# Patient Record
Sex: Female | Born: 1935 | Race: White | Hispanic: No | Marital: Married | Smoking: Never smoker
Health system: Southern US, Community
[De-identification: ages and names within clinical notes are randomized; demographics above are authoritative.]

## PROBLEM LIST (undated history)

## (undated) DIAGNOSIS — I2721 Secondary pulmonary arterial hypertension: Secondary | ICD-10-CM

## (undated) DIAGNOSIS — M545 Low back pain, unspecified: Secondary | ICD-10-CM

## (undated) DIAGNOSIS — J189 Pneumonia, unspecified organism: Secondary | ICD-10-CM

## (undated) DIAGNOSIS — K219 Gastro-esophageal reflux disease without esophagitis: Secondary | ICD-10-CM

## (undated) DIAGNOSIS — K255 Chronic or unspecified gastric ulcer with perforation: Secondary | ICD-10-CM

## (undated) DIAGNOSIS — M1990 Unspecified osteoarthritis, unspecified site: Secondary | ICD-10-CM

## (undated) DIAGNOSIS — F329 Major depressive disorder, single episode, unspecified: Secondary | ICD-10-CM

## (undated) DIAGNOSIS — G4700 Insomnia, unspecified: Secondary | ICD-10-CM

## (undated) DIAGNOSIS — F32A Depression, unspecified: Secondary | ICD-10-CM

## (undated) DIAGNOSIS — G8929 Other chronic pain: Secondary | ICD-10-CM

## (undated) DIAGNOSIS — E871 Hypo-osmolality and hyponatremia: Secondary | ICD-10-CM

## (undated) DIAGNOSIS — D649 Anemia, unspecified: Secondary | ICD-10-CM

## (undated) DIAGNOSIS — I509 Heart failure, unspecified: Secondary | ICD-10-CM

## (undated) HISTORY — PX: COLON SURGERY: SHX602

## (undated) HISTORY — PX: COLONOSCOPY: SHX174

## (undated) HISTORY — PX: ABDOMINAL SURGERY: SHX537

## (undated) HISTORY — DX: Low back pain, unspecified: M54.50

## (undated) HISTORY — PX: JOINT REPLACEMENT: SHX530

## (undated) HISTORY — PX: ABDOMINAL HYSTERECTOMY: SHX81

## (undated) HISTORY — DX: Low back pain: M54.5

## (undated) HISTORY — PX: ERCP: SHX60

## (undated) HISTORY — PX: ROTATOR CUFF REPAIR: SHX139

## (undated) HISTORY — DX: Secondary pulmonary arterial hypertension: I27.21

## (undated) HISTORY — DX: Insomnia, unspecified: G47.00

## (undated) HISTORY — DX: Other chronic pain: G89.29

---

## 1998-01-12 ENCOUNTER — Ambulatory Visit (HOSPITAL_COMMUNITY): Admission: RE | Admit: 1998-01-12 | Discharge: 1998-01-12 | Payer: Self-pay | Admitting: Family Medicine

## 1998-03-20 HISTORY — PX: BACK SURGERY: SHX140

## 1998-05-26 ENCOUNTER — Encounter: Payer: Self-pay | Admitting: General Surgery

## 1998-05-26 ENCOUNTER — Inpatient Hospital Stay (HOSPITAL_COMMUNITY): Admission: RE | Admit: 1998-05-26 | Discharge: 1998-05-29 | Payer: Self-pay | Admitting: General Surgery

## 1998-06-06 ENCOUNTER — Emergency Department (HOSPITAL_COMMUNITY): Admission: EM | Admit: 1998-06-06 | Discharge: 1998-06-06 | Payer: Self-pay | Admitting: Emergency Medicine

## 1998-11-29 ENCOUNTER — Inpatient Hospital Stay (HOSPITAL_COMMUNITY): Admission: EM | Admit: 1998-11-29 | Discharge: 1998-12-07 | Payer: Self-pay | Admitting: Emergency Medicine

## 1998-11-30 ENCOUNTER — Encounter: Payer: Self-pay | Admitting: General Surgery

## 1998-12-01 ENCOUNTER — Encounter: Payer: Self-pay | Admitting: General Surgery

## 1999-01-05 ENCOUNTER — Ambulatory Visit (HOSPITAL_COMMUNITY): Admission: RE | Admit: 1999-01-05 | Discharge: 1999-01-05 | Payer: Self-pay | Admitting: General Surgery

## 1999-01-05 ENCOUNTER — Encounter: Payer: Self-pay | Admitting: General Surgery

## 1999-07-25 ENCOUNTER — Inpatient Hospital Stay (HOSPITAL_COMMUNITY): Admission: RE | Admit: 1999-07-25 | Discharge: 1999-07-28 | Payer: Self-pay | Admitting: General Surgery

## 2001-02-03 ENCOUNTER — Encounter: Payer: Self-pay | Admitting: Emergency Medicine

## 2001-02-03 ENCOUNTER — Emergency Department (HOSPITAL_COMMUNITY): Admission: EM | Admit: 2001-02-03 | Discharge: 2001-02-03 | Payer: Self-pay | Admitting: Neurology

## 2001-04-09 ENCOUNTER — Ambulatory Visit (HOSPITAL_BASED_OUTPATIENT_CLINIC_OR_DEPARTMENT_OTHER): Admission: RE | Admit: 2001-04-09 | Discharge: 2001-04-09 | Payer: Self-pay | Admitting: General Surgery

## 2001-04-09 ENCOUNTER — Encounter (INDEPENDENT_AMBULATORY_CARE_PROVIDER_SITE_OTHER): Payer: Self-pay | Admitting: *Deleted

## 2001-11-03 ENCOUNTER — Emergency Department (HOSPITAL_COMMUNITY): Admission: EM | Admit: 2001-11-03 | Discharge: 2001-11-03 | Payer: Self-pay

## 2003-10-15 ENCOUNTER — Encounter: Admission: RE | Admit: 2003-10-15 | Discharge: 2003-10-15 | Payer: Self-pay | Admitting: Cardiology

## 2003-12-20 ENCOUNTER — Emergency Department (HOSPITAL_COMMUNITY): Admission: EM | Admit: 2003-12-20 | Discharge: 2003-12-20 | Payer: Self-pay | Admitting: Emergency Medicine

## 2005-05-12 ENCOUNTER — Ambulatory Visit (HOSPITAL_COMMUNITY): Admission: RE | Admit: 2005-05-12 | Discharge: 2005-05-12 | Payer: Self-pay | Admitting: Gastroenterology

## 2005-05-12 ENCOUNTER — Encounter (INDEPENDENT_AMBULATORY_CARE_PROVIDER_SITE_OTHER): Payer: Self-pay | Admitting: Specialist

## 2008-03-26 ENCOUNTER — Ambulatory Visit (HOSPITAL_COMMUNITY): Admission: EM | Admit: 2008-03-26 | Discharge: 2008-03-26 | Payer: Self-pay | Admitting: Emergency Medicine

## 2008-04-30 ENCOUNTER — Encounter: Admission: RE | Admit: 2008-04-30 | Discharge: 2008-04-30 | Payer: Self-pay | Admitting: Orthopaedic Surgery

## 2008-05-12 ENCOUNTER — Encounter: Admission: RE | Admit: 2008-05-12 | Discharge: 2008-05-12 | Payer: Self-pay | Admitting: Orthopaedic Surgery

## 2008-08-29 ENCOUNTER — Emergency Department (HOSPITAL_COMMUNITY): Admission: EM | Admit: 2008-08-29 | Discharge: 2008-08-29 | Payer: Self-pay | Admitting: Emergency Medicine

## 2008-09-02 ENCOUNTER — Ambulatory Visit (HOSPITAL_BASED_OUTPATIENT_CLINIC_OR_DEPARTMENT_OTHER): Admission: RE | Admit: 2008-09-02 | Discharge: 2008-09-02 | Payer: Self-pay | Admitting: Orthopedic Surgery

## 2008-11-26 ENCOUNTER — Emergency Department (HOSPITAL_COMMUNITY): Admission: EM | Admit: 2008-11-26 | Discharge: 2008-11-26 | Payer: Self-pay | Admitting: Emergency Medicine

## 2010-01-28 ENCOUNTER — Encounter: Admission: RE | Admit: 2010-01-28 | Discharge: 2010-01-28 | Payer: Self-pay | Admitting: Unknown Physician Specialty

## 2010-03-20 HISTORY — PX: ORIF WRIST FRACTURE: SHX2133

## 2010-03-20 HISTORY — PX: SHOULDER ARTHROSCOPY: SHX128

## 2010-03-20 HISTORY — PX: HERNIA REPAIR: SHX51

## 2010-04-13 LAB — DIFFERENTIAL
Basophils Absolute: 0 10*3/uL (ref 0.0–0.1)
Basophils Relative: 0 % (ref 0–1)
Eosinophils Absolute: 0 10*3/uL (ref 0.0–0.7)
Eosinophils Relative: 0 % (ref 0–5)
Lymphocytes Relative: 26 % (ref 12–46)
Lymphs Abs: 1.6 10*3/uL (ref 0.7–4.0)
Monocytes Absolute: 0.5 10*3/uL (ref 0.1–1.0)
Monocytes Relative: 8 % (ref 3–12)
Neutro Abs: 4.1 10*3/uL (ref 1.7–7.7)
Neutrophils Relative %: 65 % (ref 43–77)

## 2010-04-13 LAB — URINALYSIS, ROUTINE W REFLEX MICROSCOPIC
Bilirubin Urine: NEGATIVE
Hgb urine dipstick: NEGATIVE
Ketones, ur: NEGATIVE mg/dL
Nitrite: NEGATIVE
Protein, ur: NEGATIVE mg/dL
Specific Gravity, Urine: 1.019 (ref 1.005–1.030)
Urine Glucose, Fasting: NEGATIVE mg/dL
Urobilinogen, UA: 0.2 mg/dL (ref 0.0–1.0)
pH: 7 (ref 5.0–8.0)

## 2010-04-13 LAB — URINE MICROSCOPIC-ADD ON

## 2010-04-13 LAB — BASIC METABOLIC PANEL
BUN: 9 mg/dL (ref 6–23)
Calcium: 10.1 mg/dL (ref 8.4–10.5)
Creatinine, Ser: 0.66 mg/dL (ref 0.4–1.2)
GFR calc non Af Amer: >60 mL/min (ref >60)
Glucose, Bld: 98 mg/dL (ref 70–99)

## 2010-04-13 LAB — APTT: aPTT: 30 seconds (ref 24–37)

## 2010-04-13 LAB — CBC
HCT: 41.8 % (ref 36.0–46.0)
Hemoglobin: 13.5 g/dL (ref 12.0–15.0)
MCH: 32.1 pg (ref 26.0–34.0)
MCHC: 32.3 g/dL (ref 30.0–36.0)
MCV: 99.3 fL (ref 78.0–100.0)
Platelets: 292 10*3/uL (ref 150–400)
RBC: 4.21 MIL/uL (ref 3.87–5.11)
RDW: 13.8 % (ref 11.5–15.5)
WBC: 6.3 10*3/uL (ref 4.0–10.5)

## 2010-04-13 LAB — SURGICAL PCR SCREEN: Staphylococcus aureus: NEGATIVE

## 2010-04-13 LAB — PROTIME-INR
INR: 1 (ref 0.00–1.49)
Prothrombin Time: 13.4 seconds (ref 11.6–15.2)

## 2010-04-19 ENCOUNTER — Inpatient Hospital Stay (HOSPITAL_COMMUNITY)
Admission: RE | Admit: 2010-04-19 | Discharge: 2010-04-22 | DRG: 484 | Disposition: A | Discharge status: Home Health | Payer: Medicare Other | Attending: Orthopedic Surgery | Admitting: Orthopedic Surgery

## 2010-04-19 DIAGNOSIS — M1990 Unspecified osteoarthritis, unspecified site: Secondary | ICD-10-CM | POA: Diagnosis present

## 2010-04-19 DIAGNOSIS — H409 Unspecified glaucoma: Secondary | ICD-10-CM | POA: Diagnosis present

## 2010-04-19 DIAGNOSIS — M549 Dorsalgia, unspecified: Secondary | ICD-10-CM | POA: Diagnosis present

## 2010-04-19 DIAGNOSIS — M719 Bursopathy, unspecified: Principal | ICD-10-CM | POA: Diagnosis present

## 2010-04-19 DIAGNOSIS — D649 Anemia, unspecified: Secondary | ICD-10-CM | POA: Diagnosis present

## 2010-04-19 DIAGNOSIS — M67919 Unspecified disorder of synovium and tendon, unspecified shoulder: Principal | ICD-10-CM | POA: Diagnosis present

## 2010-04-19 DIAGNOSIS — G8929 Other chronic pain: Secondary | ICD-10-CM | POA: Diagnosis present

## 2010-04-19 DIAGNOSIS — K219 Gastro-esophageal reflux disease without esophagitis: Secondary | ICD-10-CM | POA: Diagnosis present

## 2010-04-20 LAB — CBC
HCT: 28.9 % — ABNORMAL LOW (ref 36.0–46.0)
Hemoglobin: 9.7 g/dL — ABNORMAL LOW (ref 12.0–15.0)
MCH: 33 pg (ref 26.0–34.0)
MCV: 98.3 fL (ref 78.0–100.0)
Platelets: 168 10*3/uL (ref 150–400)
RBC: 2.94 MIL/uL — ABNORMAL LOW (ref 3.87–5.11)
WBC: 8.2 10*3/uL (ref 4.0–10.5)

## 2010-04-20 LAB — BASIC METABOLIC PANEL
BUN: 5 mg/dL — ABNORMAL LOW (ref 6–23)
CO2: 26 mEq/L (ref 19–32)
Chloride: 103 mEq/L (ref 96–112)
Creatinine, Ser: 0.65 mg/dL (ref 0.4–1.2)
Glucose, Bld: 163 mg/dL — ABNORMAL HIGH (ref 70–99)
Potassium: 3.8 mEq/L (ref 3.5–5.1)

## 2010-04-21 LAB — CBC
HCT: 29.3 % — ABNORMAL LOW (ref 36.0–46.0)
MCH: 31.9 pg (ref 26.0–34.0)
MCV: 97.3 fL (ref 78.0–100.0)
RDW: 13.3 % (ref 11.5–15.5)
WBC: 7.8 10*3/uL (ref 4.0–10.5)

## 2010-04-21 LAB — BASIC METABOLIC PANEL
BUN: 4 mg/dL — ABNORMAL LOW (ref 6–23)
Creatinine, Ser: 0.67 mg/dL (ref 0.4–1.2)
GFR calc non Af Amer: >60 mL/min (ref >60)
Glucose, Bld: 137 mg/dL — ABNORMAL HIGH (ref 70–99)
Potassium: 3.8 mEq/L (ref 3.5–5.1)

## 2010-05-03 NOTE — Discharge Summary (Signed)
  NAME:  AARALYN, KIL NO.:  [id number]  MEDICAL RECORD NO.:  [id number]           PATIENT TYPE:  I  LOCATION:  5023                         FACILITY:  [facility]  PHYSICIAN:  Jones Broom, MD    DATE OF BIRTH:  17-May-1930  DATE OF ADMISSION:  04/19/2010 DATE OF DISCHARGE:  04/22/2010                              DISCHARGE SUMMARY   FINAL DIAGNOSES: 1. Left shoulder rotator cuff tear arthropathy. 2. Chronic back pain. 3. Osteoarthritis.  PRINCIPAL PROCEDURE:  Left shoulder reverse total shoulder arthroplasty on April 19, 2010.  CONSULTING PHYSICIAN:  None.  HOSPITAL COURSE:  The patient was admitted after undergoing left reverse total shoulder replacement on April 19, 2010, which went uneventfully. She recovered well on the floor postoperatively.  Initially, she had significant pain given her chronic pain history.  She had a lot of back pain immediately after the surgery which resolved after the first 24 hours.  She initially refused all therapy as well as her lab draws and her vital signs, but quickly recovered and began working with therapy. This set her back a day in terms of her recovery.  Postoperative day #2, her dressing was changed and her incision was clean, dry and intact. Her drain was removed on postoperative day #1.  Her pain was under good control with her chronic long-acting pain medicine in addition to oral Dilaudid.  This was as recommended by her outpatient chronic pain management doctor.  On the day of discharge, her pain was well controlled.  Her incision was clean, dry and intact.  She had worked with physical therapy and occupational therapy, both recommended some home care.  She worked on gentle hand, wrist and elbow motion, but no shoulder motion at the time of discharge.  She remained stable with stable vital signs.  She had some acute blood loss anemia but remained stable and her blood count remained within the safe range.   No transfusion was necessary.  POSTOPERATIVE PLAN:  She will be discharged home with her husband. Occupational and physical therapy will be set up.  She will get a hemi walker to assist with ambulation given her chronic back issues.  I will see her back in 10-14 days or sooner with any problems.  Her discharge pain medications were all prescribed previously by her chronic pain management physician.     Jones Broom, MD     JC/MEDQ  D:  04/22/2010  T:  04/22/2010  Job:  [id number]  Electronically Signed by Jones Broom  on 05/03/2010 11:14:47 AM

## 2010-05-03 NOTE — Op Note (Signed)
NAME:  Monique Russo, Monique Russo NO.:  [id number]  MEDICAL RECORD NO.:  [id number]          PATIENT TYPE:  INP  LOCATION:  5023                         FACILITY:  [facility]  PHYSICIAN:  Jones Broom, MD    DATE OF BIRTH:  [birth year]  DATE OF PROCEDURE:  04/19/2010 DATE OF DISCHARGE:                              OPERATIVE REPORT   PREOPERATIVE DIAGNOSIS:  Left shoulder rotator cuff tear arthropathy.  POSTOPERATIVE DIAGNOSIS:  Left shoulder rotator cuff tear arthropathy.  PROCEDURE PERFORMED:  Left reverse total shoulder replacement.  ATTENDING SURGEON:  Jones Broom, MD  ASSISTANT:  [name], MD (Dr. Shelba Flake assistance was imperative in positioning, retraction and decision making during the procedure).  ANESTHESIA:  GETA with preoperative interscalene block given by the attending anesthesiologist.  COMPLICATIONS:  None.  DRAINS:  One medium Hemovac.  SPECIMENS:  None.  ESTIMATED BLOOD LOSS:  200 mL.  INDICATIONS FOR SURGERY:  The patient is a 77 year old female with chronic pain and a chronic left shoulder dysfunction.  She had a rotator cuff tear arthropathy on the left side which has been longstanding.  She has had many years of severe pain and dysfunction of the shoulder.  She has had multiple injections with diminishing results and wished to go forward with reverse total shoulder replacement understanding risks, benefits and alternatives to the procedure including but not limited to risk of bleeding, infection, damage to neurovascular structures, risk of intraoperative postoperative fracture, risk of dislocation, stiffness and potential need for future surgery.  She understood all of this and elected to go forward with surgery.  OPERATIVE FINDINGS:  Examination under anesthesia demonstrated preoperative range of motion limited to about 90 degrees forward flexion, external rotation of 5 degrees.  Postoperatively, I was able to bring up to about  150 degrees forward flexion, external rotation to 40 degrees.  A DePuy reverse total shoulder was implanted with a standard metaglene, a 42 mm glenosphere, a 12 stem and a 3-mm poly component.  Excellent stability was noted intraoperatively.  PROCEDURE IN DETAILS:  The patient was identified in the preoperative holding area where I personally marked the operative site after verifying site, side and procedure with the patient.  She had an interscalene block given by the attending anesthesiologist and was taken back to the operating room where she was transferred to the operative room table and general anesthesia was induced without complication.  She was placed in a beach-chair position with the head and neck carefully padded and positioned and the opposite extremity was carefully padded and positioned with a pillow beneath the knees.  Left upper extremity was then prepped and draped in a standard sterile fashion.  In the appropriate time-out, procedure was carried out with myself, the operative staff, and the anesthesia staff all verifying site, side and procedure.  An approximately 12-cm incision was made from the coracoid to the humeral shaft to the level of the axilla.  Dissection was carried down through subcutaneous tissues to the level of the cephalic vein which was identified and retracted laterally.  The conjoined tendon was identified in the deep layer and the deltoid  was retracted laterally and the pectoralis major was retracted medially.  The subdeltoid space was freed up and the anterior vessels were clamped and coagulated.  Biceps tendon was identified and was tenotomized at the level of the glenoid. Tenodesis was performed to the soft tissue just anterior to the pectoralis major after first releasing about the upper 1 cm of the pectoralis major.  The remaining portion of the long head biceps was excised.  The subscapularis was then tagged and released off of the lesser  tuberosity.  The arm was progressively externally rotated and capsulotomy was performed peeling the capsule and subscapularis off the anterior humerus and 1 sleeve.  The axillary nerve was carefully protected.  The head was then dislocated and the 6-mm entry reamer was used to enter the canal.  Sequential reaming was carried out until cortical contact was achieved at 12 mm.  The 12-mm cutting guide was then used to make a head cut in 10 degrees retroversion.  The head protector was then placed and the glenoid was exposed.  Circumferential glenoid capsular release was carried out and the glenoid was noted to be severely degenerated and preferentially superiorly worn.  The guide was placed and the pin was placed by cortical fixation, the superior portion of the glenoid in a position that was felt to allow preferential reaming of the inferior portion of the glenoid.  Reamer was then used to ream down the concentric surface.  It was not felt that bone grafting would be necessary.  The hand reamer was used to ream superiorly which did not take much reaming given the lack of bone superiorly.  The central hole was then drilled.  There was noted to be central penetration.  The glenosphere was impacted with good initial fixation.  The four holes were then drilled, measured and filled with locking screws superior, inferior and posterior and a nonlocking screw anterior size 18.  The three locking screws all had excellent fixation prior to locking.  The anterior screw had less than optimal fixation, but did provide some additional fixation and was left in place.  All screws were then locked and the glenosphere was felt to be well fixed.  The glenosphere was then impacted and alternatively tightened with a screwdriver and impacted. It was felt to be completely down.  The humeral head was then again exposed.  It was noted that during preparation of the glenoid, the very poor bone quality of her humeral  neck has caused some collapse in this region from retraction.  The proximal reamer was then used for size 1 component and the trial was placed.  It was felt that this would be reducible with a stable construct and therefore the final implant size 12 was opened and cemented with standard cement technique.  Prior to cementation. 2 fiber wires were placed out the anterior metaphysis for later partial subscapularis repair.  Once the cement was hardened, a 3- mm trial was reduced and felt to be excellent soft tissue tension. There was no tendency towards subluxation or dislocation with full range of motion including abduction and extension.  Therefore, this was dislocated and the 3-mm final poly implant was placed and reduced with the same soft tissue tension and stability.  At this point copious irrigation was used as pulse irrigator and the subscapularis was then repaired to the anterior humeral neck using the previously placed sutures to provide some additional anterior soft tissue coverage instability.  A medium Hemovac drain was placed out beneath the  deltoid insertion and the wound was then closed in layers with 0 Vicryl in a deep layer in the interval, 2-0 Vicryl for skin closure with 4-0 Monocryl.  Steri-Strips were then applied and 4x4s, ABDs, and tape were used as sterile dressing.  The patient was placed in a sling immobilizer, allowed to awaken from general anesthesia, transferred to stretcher, taken to recovery room in stable condition.  POSTOPERATIVE PLAN:  She will be kept in the hospital at least 2 nights for recovery and to begin therapy.  She will likely be discharged home at that point with her husband.  She will follow standard postoperative protocol for reverse shoulder arthroplasty.     Jones Broom, MD     JC/MEDQ  D:  04/19/2010  T:  04/20/2010  Job:  [id number]  Electronically Signed by Jones Broom  on 05/03/2010 11:14:53 AM

## 2010-06-24 LAB — DIFFERENTIAL
Basophils Absolute: 0 10*3/uL (ref 0.0–0.1)
Basophils Relative: 0 % (ref 0–1)
Eosinophils Relative: 1 % (ref 0–5)
Monocytes Absolute: 0.4 10*3/uL (ref 0.1–1.0)

## 2010-06-24 LAB — URINALYSIS, ROUTINE W REFLEX MICROSCOPIC
Bilirubin Urine: NEGATIVE
Glucose, UA: NEGATIVE mg/dL
Ketones, ur: NEGATIVE mg/dL
Nitrite: NEGATIVE
Protein, ur: NEGATIVE mg/dL
pH: 7.5 (ref 5.0–8.0)

## 2010-06-24 LAB — CBC
HCT: 39.8 % (ref 36.0–46.0)
Hemoglobin: 13.5 g/dL (ref 12.0–15.0)
MCHC: 34 g/dL (ref 30.0–36.0)
RDW: 13.1 % (ref 11.5–15.5)

## 2010-06-24 LAB — POCT I-STAT, CHEM 8
Calcium, Ion: 1.2 mmol/L (ref 1.12–1.32)
HCT: 41 % (ref 36.0–46.0)
Hemoglobin: 13.9 g/dL (ref 12.0–15.0)
TCO2: 28 mmol/L (ref 0–100)

## 2010-06-27 LAB — BASIC METABOLIC PANEL
BUN: 6 mg/dL (ref 6–23)
Chloride: 102 mEq/L (ref 96–112)
Creatinine, Ser: 0.56 mg/dL (ref 0.4–1.2)
GFR calc non Af Amer: >60 mL/min (ref >60)

## 2010-06-27 LAB — POCT HEMOGLOBIN-HEMACUE: Hemoglobin: 12.7 g/dL (ref 12.0–15.0)

## 2010-07-04 LAB — CBC
MCHC: 34.7 g/dL (ref 30.0–36.0)
RBC: 3.32 MIL/uL — ABNORMAL LOW (ref 3.87–5.11)

## 2010-07-04 LAB — BASIC METABOLIC PANEL
CO2: 27 mEq/L (ref 19–32)
Calcium: 7.2 mg/dL — ABNORMAL LOW (ref 8.4–10.5)
Creatinine, Ser: 0.46 mg/dL (ref 0.4–1.2)
GFR calc Af Amer: >60 mL/min (ref >60)

## 2010-07-04 LAB — DIFFERENTIAL
Basophils Absolute: 0 10*3/uL (ref 0.0–0.1)
Basophils Relative: 0 % (ref 0–1)
Monocytes Relative: 6 % (ref 3–12)
Neutro Abs: 7.4 10*3/uL (ref 1.7–7.7)
Neutrophils Relative %: 81 % — ABNORMAL HIGH (ref 43–77)

## 2010-07-04 LAB — PROTIME-INR
INR: 1.1 (ref 0.00–1.49)
Prothrombin Time: 14.3 seconds (ref 11.6–15.2)

## 2010-07-04 LAB — APTT: aPTT: 32 seconds (ref 24–37)

## 2010-08-02 NOTE — Op Note (Signed)
Monique Russo, Monique Russo NO.:  [id number]   MEDICAL RECORD NO.:  [id number]          PATIENT TYPE:  INP   LOCATION:  0098                         FACILITY:  [facility]   PHYSICIAN:  Feliberto Gottron. Turner Daniels, M.D.   DATE OF BIRTH:  [birth year]   DATE OF PROCEDURE:  03/26/2008  DATE OF DISCHARGE:                               OPERATIVE REPORT   PREOPERATIVE DIAGNOSIS:  Right shoulder inferior dislocation or luxatio  erecta.   POSTOPERATIVE DIAGNOSIS:  Right shoulder inferior dislocation or luxatio  erecta.   PROCEDURE:  Closed reduction right shoulder inferior dislocation.   SURGEON:  Feliberto Gottron. Turner Daniels, MD   FIRST ASSISTANT:  Shirl Harris, PA   ANESTHESIA:  Anesthetic was general endotracheal.   ESTIMATED BLOOD LOSS:  Minimal.   FLUID REPLACEMENT:  500 mL of crystalloid.   DRAINS PLACED:  None.   TOURNIQUET TIME:  None.   INDICATIONS FOR PROCEDURE:  The patient is a 77 year old woman on major  league chronic pain management taking 100 mg of morphine sulfate three  times a day extended release and 15 mg tablets of morphine sulfate for  breakthrough pain for neck and back pain for many years.  She slipped  and fell today and sustained an inferior dislocation of her right  shoulder with the arm held in the upright position classic luxatio  erecta position.  Transported to [facility].  X-rays were  taken confirming the diagnosis.  She did not have any actual fractures  of the bone.  Because she fell and struck her head she was  preoperatively evaluated with CTs of the head and neck which were  cleared except for some arthritic changes and had the usual preoperative  studies.  She is taken for closed reduction, possible open reduction of  the dislocation.  Risks and benefits of surgery discussed, questions  answered.   DESCRIPTION OF PROCEDURE:  The patient identified by armband, taken to  the operating room at [facility] where the  appropriate anesthetic monitors were attached and general endotracheal  anesthesia induced with the patient in the supine position.  She was  then transferred from the gurney to the OR table with the right shoulder  protruding laterally over the table.  Under C-arm image control we then  converted her to an anterior dislocation by using the push-pull method  with the push being applied to the medial epicondyle and the counter  pressure to the proximal humerus.  This did bring the humeral head  anteriorly and we then performed a standard anterior reduction by  bringing the arm down to the side with internal and external rotation  with a satisfying  reduction maneuver.  C-arm images were then taken in internal and  external rotation as well as an axillary lateral view confirming the  anatomic reduction of the humeral head.  The patient was then placed in  a sling immobilizer, awakened and taken to the recovery room without  difficulty.      Feliberto Gottron. Turner Daniels, M.D.  Electronically Signed     FJR/MEDQ  D:  03/26/2008  T:  03/27/2008  Job:  [id number]

## 2010-08-02 NOTE — Op Note (Signed)
NAME:  Monique Russo, Monique Russo                ACCOUNT NO.:  [id number]   MEDICAL RECORD NO.:  [id number]          PATIENT TYPE:  AMB   LOCATION:  DSC                          FACILITY:  [facility]   PHYSICIAN:  Harvie Junior, M.D.   DATE OF BIRTH:  Jun 01, 1930   DATE OF PROCEDURE:  09/02/2008  DATE OF DISCHARGE:                               OPERATIVE REPORT   PREOPERATIVE DIAGNOSIS:  Distal radius fracture with greater than 3  distal pieces.   POSTOPERATIVE DIAGNOSIS:  Distal radius fracture with greater than 3  distal pieces.   PRINCIPAL PROCEDURE:  Open reduction and internal fixation of comminuted  distal radius fracture with a distal volar radius plate.   SURGEON:  Harvie Junior, MD   ASSISTANT:  Marshia Ly, PA   ANESTHESIA:  General.   BRIEF HISTORY:  Monique Russo is a 77 year old female with long history of  having had a left distal radius fracture sometime in the past, she had  essentially a malunion and she was having sort of chronic pain.  She had  taken a tumble a couple of days ago and suffered a distal radius  fracture, comminuted, intraarticular.  We talked about treatment options  and ultimately felt that the most appropriate course of action would be  open reduction and internal fixation.  She was brought to the operating  room for this procedure.   PROCEDURE:  The patient was brought to the operating room.  After  adequate anesthesia was obtained with general anesthetic, the patient  was placed supine on the operating table.  The left wrist was then  prepped and draped in usual sterile fashion.  During the prepping and  draping, we noticed her to have a little bit of a complex wound over the  lateral aspect of her elbow, and we prepped and cleansed this into the  operative field.  Following this, the arm was prepped and draped in the  usual sterile fashion.  The arm was exsanguinated, blood pressure  tourniquet inflated to 350 mmHg.  Following this, a small incision was  made over the flexor carpi radialis tendon.  Subcutaneous tissues  dissected down to the level of flexor carpi radialis tendon.  Once this  was completed, the attention was turned towards the distal volar radius.  We got the FCR tendon sheath, explored to the floor, and then went down  onto the bone.  The palmaris quadratus was somewhat adherent into the  fracture, and I think this is from her previous fracture site.  This was  debrided, and the fracture fragments could be identified.  Following  this, attempts were made at sort of a distractive volarly inclined sort  of osteotomy-type procedure, and this was performed, and then the plate  was attached.  Once the plate was attached, it did give [place] access to be  able to manipulate the distal radius, and we did regain some radial  length and some volar inclination.  The plate was then attached to the  fracture with four 3.5 screws on the shaft and 7 distal smooth pegs.  Excellent fixation  and reduction was achieved with slight increase and  improvement in radial length and a slight improvement in volar  inclination.  Once this was completed, the wound was copiously and  thoroughly irrigated and suctioned dry, skin closed in  layers.  Sterile compression dressing was applied, and the patient was  taken to the recovery room, and was noted to be in satisfactory  condition.  Estimated blood loss for this procedure was none.  Of note,  fluoro images were used throughout the case.      Harvie Junior, M.D.  Electronically Signed     JLG/MEDQ  D:  09/02/2008  T:  09/03/2008  Job:  [id number]

## 2010-08-05 NOTE — Op Note (Signed)
[place]  Patient:    Monique Russo, Monique Russo              MRN: [id number] Proc. Date: 07/25/99 Adm. Date:  [id number] Attending:  Arlis Porta                           Operative Report  PREOPERATIVE DIAGNOSIS:  Recurrent ventral hernia measuring 9 cm x 10 cm.  POSTOPERATIVE DIAGNOSIS:  Recurrent ventral hernia measuring 9 cm x 10 cm.  PROCEDURE:  Laparoscopic ventral hernia repair with mesh.  SURGEON:  Adolph Pollack, M.D.  ASSISTANT:  Velora Heckler, M.D.  ANESTHESIA:  General.  INDICATIONS:  This 77 year old female had a laparoscopic repair of a giant ventral hernia 14 months ago.  She underwent emergency operation of her small bowel obstruction in September 2000 and now has had recurrence of her hernia and presents for repair.  TECHNIQUE:  She was placed supine on the operating table and general anesthetic was administered.  The abdomen was sterilely prepped and draped.  A left upper quadrant incision was made, incising the skin sharply and carrying this down to the subcutaneous tissue.  An incision was made in the anterior fascia and a purse-string suture of 0 Vicryl was placed around the fascial edges.  Next, using blunt dissection, the peritoneal cavity was entered.  A Hasson trocar was introduced into the peritoneal cavity and pneumoperitoneum created by insufflation of CO2 gas.  Next, a laparoscope was introduced.  I noted a hernia.  It appeared that part of the mesh had pulled away from the right lateral abdominal wall.  Up in the hernia was some small bowel that was adherent to it.  I placed a 5 mm trocar in the left lower quadrant and a 10 mm trocar in the right upper quadrant. With downward traction on the small bowel, I sharply incised adhesions and allowed the small bowel to fall back into the abdomen.  No damage to the small bowel was noted.  There was a slight cut of the inferior epigastric vessels and  these were clipped.  Areas of bleeding on the abdominal wall were controlled with the cautery.  Next, I took four spinal needles and placed them at the peripheral rim of the hernia.  I measured 3 cm away from these areas and drew a large oval.  The hernia defect measured 10 x 9 cm. I brought up an appropriate sized piece of Gore-Tex dual mesh to the field and cut it to fit the oval.  I then placed anchoring sutures at each quadrant.  I then rolled the mesh up and placed it into the peritoneal cavity and unfurled it with the rough side facing up and the smooth side facing down.  I next made four small incisions in the four quadrants and pulled at the sutures across the fascial bridge.  This allowed me to anchor the mesh to the anterior abdominal wall.  I further anchored it with an outer and inner rim of spiral tacks.  This allowed for more than adequate coverage of the defect.  Next, I released the pneumoperitoneum and watched the viscera approximate themselves to the smooth side of the mesh.  I then removed all trocar sites. The left upper quadrant fascial incision was closed by tying down the purse-string suture.  All incisions were then closed with a 4-0 Monocryl subcuticular sutures.  Steri-Strips and sterile dressings were applied.  She tolerated the procedure well without any apparent complications and was taken to the recovery room in satisfactory condition. DD:  07/25/99 TD:  07/27/99 Job: [id number]

## 2010-08-05 NOTE — H&P (Signed)
. [facility]  Patient:    Monique Russo, Monique Russo              MRN: [id number] Adm. Date:  [id number] Attending:  Arlis Porta                         History and Physical  REASON FOR ADMISSION:  Repair of recurrent ventral incisional hernia.  HISTORY OF PRESENT ILLNESS:  Monique Russo is a 77 year old female who has had multiple abdominal operations.  This started out with an emergency right colectomy.  She had a giant ventral incisional repair laparoscopically.  Six months after that she had a small bowel obstruction requiring operative management.  She noted a mid marginal bulge near her lower portion of her incision consistent with a hernia and she presents now for repair.  She has had no change in her bowel habits, no nausea and vomiting.  PAST MEDICAL HISTORY: 1. Bipolar disorder. 2. A cecal volvulus. 3. Glaucoma 4. Severe degenerative joint disease of the back.  PREVIOUS OPERATIONS: 1. Hysterectomy. 2. Right hemicolectomy. 3. Laparoscopic ventral hernia repair. 4. Lysis of adhesions from small bowel obstruction. 5. Foot surgery. 6. Bladder tacking.  ALLERGIES:  DEMEROL, CORTISONE, PREDNISONE, PREMARIN.  MEDICATIONS: 1. Lasix 40 mg a day. 2. Multivitamin 1 a day. 3. MS-Contin 60 mg t.i.d. 4. Restoril 30 mg q.h.s. 5. Timoptic ophthalmic solution 1 drop each eye q.h.s. 6. Cipro 500 mg b.i.d.  SOCIAL HISTORY:  She is married.  No tobacco or alcohol use.  PHYSICAL EXAMINATION:  GENERAL:  She is a teary female who appears very nervous.  VITAL SIGNS:  Temperature 99.0, blood pressure 130/70, heart rate 78, and respiratory rate of 18.  She is 5 foot 3 inches tall and weighs 142 pounds.  CARDIOVASCULAR:  Heart demonstrates regular rate and rhythm without a murmur.  RESPIRATORY:  Breath sounds equal and clear.  Respirations nonlabored.  ABDOMEN:  Soft.  There is obvious bulge in the right lower abdominal area just adjacent  to the mid line incision.  It is reducible.  There is a lower midline scar.  EXTREMITIES:  No edema.  IMPRESSION:  Recurrent ventral incisional hernia.  PLAN: Laparoscopic possible open repair with mesh.  The procedure and risks have been explained to her and her husband.  They seem to understand and agree to proceed. DD:  07/25/99 TD:  07/25/99 Job: [id number]

## 2010-08-05 NOTE — Op Note (Signed)
NAME:  Monique Russo, Monique Russo                ACCOUNT NO.:  [id number]   MEDICAL RECORD NO.:  [id number]          PATIENT TYPE:  AMB   LOCATION:  ENDO                         FACILITY:  [facility]   PHYSICIAN:  Anselmo Rod, M.D.  DATE OF BIRTH:  1930-05-16   DATE OF PROCEDURE:  DATE OF DISCHARGE:                                 OPERATIVE REPORT   PROCEDURE:  Esophagogastroduodenoscopy.   ENDOSCOPIST:  Anselmo Rod, M.D.   INSTRUMENT USED:  Olympus video panendoscope.   INDICATIONS FOR PROCEDURE:  Dysphagia and rectal bleeding in a 77 year old  white female, rule out peptic esophagitis, stricture, Barrett's mucosa, etc.   PREPROCEDURE PREPARATION:  Informed consent was obtained from the patient.  The patient was fasted for four hours prior to the procedure.  The risks and  benefits of the procedure including perforation, bleeding, etc., have been  discussed with her in great detail.   PREPROCEDURE PHYSICAL:  Patient with stable vital signs.  Neck supple.  Chest clear to auscultation.  S1 and S2 regular.  Abdomen soft with normal  bowel sounds.   DESCRIPTION OF PROCEDURE:  The patient was placed in the left lateral  decubitus position, sedated with 60 mcg of Fentanyl and 4.5 mg Versed in  slow incremental doses.  Once the patient was adequately sedated, maintained  on low flow oxygen and continuous cardiac monitoring, the Olympus video  panendoscope was advanced through the mouth piece over the tongue into the  esophagus under direct vision. The entire esophagus appeared normal with no  evidence of ring, strictures, masses, esophagitis, or Barrett's mucosa.  The  esophagus is widely patent.  The scope was then advanced into the stomach.  Mild antral gastritis was noted.  Gastric biopsies were done to rule out the  presence of H. pylori by pathology.  The proximal small bowel appeared  normal.   IMPRESSION:  1.  Widely patent esophagus, no esophagitis or Barrett's mucosa noted, no  stricture present.  2.  Mild gastritis noted in the mid body and antrum, biopsies done to rule      out H. pylori by pathology.  3.  Normal proximal small bowel.   RECOMMENDATIONS:  1.  Await pathology results.  2.  Avoid all nonsteroidals.  3.  Treat with antibiotics if H. pylori present on biopsies.  4.  Proceed with a colonoscopy at this time, further recommendations made      thereafter.      Anselmo Rod, M.D.  Electronically Signed     JNM/MEDQ  D:  05/12/2005  T:  05/13/2005  Job:  [id number]   cc:   Adolph Pollack, M.D.  [address], [address]  [place]  [place] [address]   Gabriel Earing, M.D.  Fax: [phone]

## 2010-08-05 NOTE — Discharge Summary (Signed)
[facility]  Patient:    Monique Russo, Monique Russo              MRN: [id number] Adm. Date:  [id number] Disc. Date: [id number] Attending:  Arlis Porta                           Discharge Summary  PRINCIPAL DISCHARGE DIAGNOSIS:  Recurrent ventral incisional hernia.  SECONDARY DIAGNOSES:  1. Bipolar disorder.  2. Gastroesophageal reflux disease.  3. Atelectasis.  4. Hypokalemia.  PROCEDURE:  Laparoscopic repair of recurrent ventral incisional hernia with mesh, Jul 25, 1999.  INDICATION:  This 77 year old female has had multiple abdominal surgeries including a previous laparoscopic ventral incisional hernia repair.  He had exploratory laparotomy for adhesive small-bowel obstruction following that. Recently she noted a bulge to the right of her midline.  This was consistent with ventral hernia.  She was admitted for repair of her ventral hernia.  HOSPITAL COURSE:  She tolerated the procedure well.  She did have some hypokalemia that was corrected.  She restarted on all her home medications including her Klonopin for her bipolar disorder.  She had episodes of mania and depression while in the hospital.  Pain control was adequate with the PCA. She did have some fever secondary to atelectasis, but once she was more mobile and using incentive spirometry the fever resolved.  By her third postoperative day she was tolerating a diet, feeling better.  Wounds were clean and intact and she was discharged.  DISPOSITION:  Discharged to home on Jul 28, 1999.  A bedside commode was arranged and she will have this at home.  She was given activity restrictions. Diet is as tolerated.  She will continue her home medicines and take Tylox for pain.  An instruction sheet was given to her.  CONDITION ON DISCHARGE:  She is discharged in satisfactory condition. DD:  08/03/99 TD:  08/04/99 Job: [id number]

## 2010-08-05 NOTE — Op Note (Signed)
NAME:  Monique Russo, Monique Russo                ACCOUNT NO.:  [id number]   MEDICAL RECORD NO.:  [id number]          PATIENT TYPE:  AMB   LOCATION:  ENDO                         FACILITY:  [facility]   PHYSICIAN:  Anselmo Rod, M.D.  DATE OF BIRTH:  June 21, 1930   DATE OF PROCEDURE:  05/12/2005  DATE OF DISCHARGE:                                 OPERATIVE REPORT   PROCEDURE:  Colonoscopy.   ENDOSCOPIST:  Anselmo Rod, M.D.   INSTRUMENT USED:  Olympus video colonoscope.   INDICATIONS FOR PROCEDURE:  The patient is a 77 year old white female with a  history of rectal bleeding and abdominal pain.  Rule out colonic polyps,  masses, etc.   PREPROCEDURE PREPARATION:  Informed consent was procured from the patient.  The patient was fasted for four hours prior to the procedure and prepped  with a bottle of magnesium citrate and a gallon of GoLYTELY the night prior  to the procedure.  Risks and benefits of the procedure including a 10%  misread of cancer and polyps were discussed with her as well.   PREPROCEDURE PHYSICAL:  VITAL SIGNS:  The patient had stable vital signs.  NECK:  Supple.  CHEST:  Clear to auscultation.  CARDIAC:  S1 and S2 regular.  ABDOMEN:  Soft with normal bowel sounds.   DESCRIPTION OF PROCEDURE:  The patient was placed in the left lateral  decubitus position and sedated with an additional 40 mcg of fentanyl  and  4.5 mg of Versed in slow incremental doses.  Once the patient was adequately  sedated, maintained on low-flow oxygen and continuous cardiac monitoring,  the Olympus video colonoscope was advanced from the rectum to the hepatic  flexure with difficulty.  The patient had some abdominal discomfort and  insufflation of air into the colon indicating a component of visceral  hypersensitivity. There was evidence of extensive diverticulosis throughout  the colon up to the hepatic flexure.  The right colon was not visualized as  there was a large amount of residual stool in the  colon.  The patient's  position was changed from the left lateral to the supine position and the  right lateral position to reach to cecal base but this was not possible.  The procedure was aborted at this point with plans to reprep the patient and  redo the procedure at a later date.   IMPRESSION:  1.Extensive diverticulosis in the left and transverse colon.  2.Large amount residual stool on the right colon.  3.No masses or polyps seen.   RECOMMENDATIONS:  1.Reprep and redo the procedure at a later date.  Further  recommendations made on followup.  2.High fiber diet with liberal fluid intake.  3.Stool softeners to be used as needed.      Anselmo Rod, M.D.  Electronically Signed     JNM/MEDQ  D:  05/12/2005  T:  05/13/2005  Job:  [id number]   cc:   Gabriel Earing, M.D.  Fax: [phone]   Adolph Pollack, M.D.  [address], [address]  [place]  [place] [address]

## 2012-07-02 ENCOUNTER — Other Ambulatory Visit: Payer: Self-pay | Admitting: Orthopedic Surgery

## 2012-07-02 DIAGNOSIS — R609 Edema, unspecified: Secondary | ICD-10-CM

## 2012-07-02 DIAGNOSIS — R52 Pain, unspecified: Secondary | ICD-10-CM

## 2012-07-03 ENCOUNTER — Other Ambulatory Visit: Payer: Medicare Other

## 2012-07-03 ENCOUNTER — Ambulatory Visit
Admission: RE | Admit: 2012-07-03 | Discharge: 2012-07-03 | Disposition: A | Discharge status: Home / Self Care | Payer: Medicare Other | Source: Ambulatory Visit | Attending: Orthopedic Surgery | Admitting: Orthopedic Surgery

## 2012-07-03 DIAGNOSIS — R52 Pain, unspecified: Secondary | ICD-10-CM

## 2012-07-03 DIAGNOSIS — R609 Edema, unspecified: Secondary | ICD-10-CM

## 2012-09-11 ENCOUNTER — Emergency Department (HOSPITAL_BASED_OUTPATIENT_CLINIC_OR_DEPARTMENT_OTHER)
Admission: EM | Admit: 2012-09-11 | Discharge: 2012-09-11 | Disposition: A | Discharge status: Home / Self Care | Payer: Medicare Other | Attending: Emergency Medicine | Admitting: Emergency Medicine

## 2012-09-11 ENCOUNTER — Encounter (HOSPITAL_BASED_OUTPATIENT_CLINIC_OR_DEPARTMENT_OTHER): Payer: Self-pay | Admitting: Family Medicine

## 2012-09-11 ENCOUNTER — Emergency Department (HOSPITAL_BASED_OUTPATIENT_CLINIC_OR_DEPARTMENT_OTHER): Payer: Medicare Other

## 2012-09-11 DIAGNOSIS — S0993XA Unspecified injury of face, initial encounter: Secondary | ICD-10-CM | POA: Insufficient documentation

## 2012-09-11 DIAGNOSIS — S0003XA Contusion of scalp, initial encounter: Secondary | ICD-10-CM | POA: Insufficient documentation

## 2012-09-11 DIAGNOSIS — M542 Cervicalgia: Secondary | ICD-10-CM

## 2012-09-11 DIAGNOSIS — S0093XA Contusion of unspecified part of head, initial encounter: Secondary | ICD-10-CM

## 2012-09-11 DIAGNOSIS — W1809XA Striking against other object with subsequent fall, initial encounter: Secondary | ICD-10-CM | POA: Insufficient documentation

## 2012-09-11 DIAGNOSIS — Y9389 Activity, other specified: Secondary | ICD-10-CM | POA: Insufficient documentation

## 2012-09-11 DIAGNOSIS — M129 Arthropathy, unspecified: Secondary | ICD-10-CM | POA: Insufficient documentation

## 2012-09-11 DIAGNOSIS — Z79899 Other long term (current) drug therapy: Secondary | ICD-10-CM | POA: Insufficient documentation

## 2012-09-11 DIAGNOSIS — S1093XA Contusion of unspecified part of neck, initial encounter: Secondary | ICD-10-CM | POA: Insufficient documentation

## 2012-09-11 DIAGNOSIS — Y92009 Unspecified place in unspecified non-institutional (private) residence as the place of occurrence of the external cause: Secondary | ICD-10-CM | POA: Insufficient documentation

## 2012-09-11 HISTORY — DX: Unspecified osteoarthritis, unspecified site: M19.90

## 2012-09-11 NOTE — ED Provider Notes (Signed)
History    CSN: [id number] Arrival date & time 09/11/12  1657  First MD Initiated Contact with Patient 09/11/12 1715     Chief Complaint  Patient presents with  . Head Injury   (Consider location/radiation/quality/duration/timing/severity/associated sxs/prior Treatment) Patient is a 79 y.o. female presenting with head injury. The history is provided by the patient. No language interpreter was used.  Head Injury Location:  Occipital Time since incident:  5 days Mechanism of injury: fall   Pain details:    Quality:  Aching   Severity:  Moderate   Duration:  5 days   Timing:  Constant   Progression:  Worsening Chronicity:  New Relieved by:  Nothing Worsened by:  Nothing tried Ineffective treatments:  Prescription drugs Associated symptoms: headache   Associated symptoms: no blurred vision and no loss of consciousness    Past Medical History  Diagnosis Date  . Arthritis    Past Surgical History  Procedure Laterality Date  . Abdominal hysterectomy    . Back surgery    . Joint replacement    . Colon surgery    . Hernia repair     No family history on file. History  Substance Use Topics  . Smoking status: Never Smoker   . Smokeless tobacco: Not on file  . Alcohol Use: Yes   OB History   Grav Para Term Preterm Abortions TAB SAB Ect Mult Living                 Review of Systems  Unable to perform ROS Eyes: Negative for blurred vision.  Musculoskeletal: Negative for back pain.  Neurological: Positive for headaches. Negative for loss of consciousness.  All other systems reviewed and are negative.    Allergies  Review of patient's allergies indicates no known allergies.  Home Medications   Current Outpatient Rx  Name  Route  Sig  Dispense  Refill  . furosemide (LASIX) 80 MG tablet   Oral   Take 80 mg by mouth.         Marland Kitchen HYDROcodone-acetaminophen (NORCO) 10-325 MG per tablet   Oral   Take 1 tablet by mouth 3 (three) times daily.         .  methocarbamol (ROBAXIN) 750 MG tablet   Oral   Take 750 mg by mouth 3 (three) times daily.         Marland Kitchen morphine (KADIAN) 100 MG 24 hr capsule   Oral   Take 100 mg by mouth 3 (three) times daily.         Marland Kitchen morphine (MSIR) 15 MG tablet   Oral   Take 15 mg by mouth 3 (three) times daily.         . Multiple Vitamins-Minerals (MULTIVITAL PO)   Oral   Take by mouth.         . potassium chloride (K-DUR,KLOR-CON) 10 MEQ tablet   Oral   Take 10 mEq by mouth 2 (two) times daily.         . temazepam (RESTORIL) 30 MG capsule   Oral   Take 30 mg by mouth at bedtime as needed for sleep.         Marland Kitchen timolol (BETIMOL) 0.5 % ophthalmic solution      1 drop 2 (two) times daily.          BP 135/64  Pulse 75  Temp(Src) 98.7 F (37.1 C) (Oral)  Resp 20  SpO2 93% Physical Exam  Nursing note and vitals  reviewed. Constitutional: She is oriented to person, place, and time. She appears well-developed and well-nourished.  HENT:  Head: Normocephalic and atraumatic.  Tender bruised occipital scalp  Eyes: Conjunctivae and EOM are normal. Pupils are equal, round, and reactive to light.  Neck: Normal range of motion. Neck supple.  Tender c spine diffusely  Cardiovascular: Normal rate.   Pulmonary/Chest: Effort normal.  Musculoskeletal: Normal range of motion.  Neurological: She is alert and oriented to person, place, and time. She has normal reflexes. No cranial nerve deficit. Coordination normal.  Skin: Skin is warm.  Psychiatric: She has a normal mood and affect.    ED Course  Procedures (including critical care time) Labs Reviewed - No data to display Ct Head Wo Contrast  09/11/2012   *RADIOLOGY REPORT*  Clinical Data:  Recent fall with head injury.  Headache.  CT HEAD WITHOUT CONTRAST CT CERVICAL SPINE WITHOUT CONTRAST  Technique:  Multidetector CT imaging of the head and cervical spine was performed following the standard protocol without intravenous contrast.  Multiplanar CT  image reconstructions of the cervical spine were also generated.  Comparison:  CT 08/29/2008  CT HEAD  Findings: Ventricle size is normal.  Age appropriate atrophy. Normal cerebral volume.  Mild chronic microvascular ischemia in the white matter.  No acute infarct, hemorrhage, or mass lesion.  Negative for skull fracture.  IMPRESSION: No acute abnormality.  CT CERVICAL SPINE  Findings: Negative for fracture.  Multilevel disc and facet degeneration.  Moderately large central disc protrusion at C2-3 with facet degeneration.  Mild posterior slip C3-4 with facet hypertrophy and spinal stenosis.  There is spondylosis and facet degeneration at C4-5 and C5-6 and C6-7.  IMPRESSION: Negative for cervical spine fracture.   Original Report Authenticated By: Janeece Riggers, M.D.   Ct Cervical Spine Wo Contrast  09/11/2012   *RADIOLOGY REPORT*  Clinical Data:  Recent fall with head injury.  Headache.  CT HEAD WITHOUT CONTRAST CT CERVICAL SPINE WITHOUT CONTRAST  Technique:  Multidetector CT imaging of the head and cervical spine was performed following the standard protocol without intravenous contrast.  Multiplanar CT image reconstructions of the cervical spine were also generated.  Comparison:  CT 08/29/2008  CT HEAD  Findings: Ventricle size is normal.  Age appropriate atrophy. Normal cerebral volume.  Mild chronic microvascular ischemia in the white matter.  No acute infarct, hemorrhage, or mass lesion.  Negative for skull fracture.  IMPRESSION: No acute abnormality.  CT CERVICAL SPINE  Findings: Negative for fracture.  Multilevel disc and facet degeneration.  Moderately large central disc protrusion at C2-3 with facet degeneration.  Mild posterior slip C3-4 with facet hypertrophy and spinal stenosis.  There is spondylosis and facet degeneration at C4-5 and C5-6 and C6-7.  IMPRESSION: Negative for cervical spine fracture.   Original Report Authenticated By: Janeece Riggers, M.D.   1. Contusion of head, initial encounter   2.  Neck pain     MDM  Pt advised to continue current pain medications  Elson Areas, PA-C 09/11/12 1847

## 2012-09-11 NOTE — ED Notes (Signed)
Pt reports she fell the night from a standing position striking the back of her head on the kitchen floor.  Denies LOC.

## 2012-09-11 NOTE — ED Notes (Signed)
Patient transported to CT 

## 2012-09-11 NOTE — ED Notes (Signed)
Pt sts she fell Saturday night hitting back left side of head in her kitchen. Denies loc. Pt sts head is hurting worse today.

## 2012-09-12 NOTE — ED Provider Notes (Signed)
Medical screening examination/treatment/procedure(s) were performed by non-physician practitioner and as supervising physician I was immediately available for consultation/collaboration.   Loren Racer, MD 09/12/12 2350

## 2012-09-19 ENCOUNTER — Other Ambulatory Visit: Payer: Self-pay | Admitting: Pain Medicine

## 2012-09-19 DIAGNOSIS — M549 Dorsalgia, unspecified: Secondary | ICD-10-CM

## 2012-09-19 DIAGNOSIS — M545 Low back pain: Secondary | ICD-10-CM

## 2012-09-30 ENCOUNTER — Ambulatory Visit
Admission: RE | Admit: 2012-09-30 | Discharge: 2012-09-30 | Disposition: A | Discharge status: Home / Self Care | Payer: Medicare Other | Source: Ambulatory Visit | Attending: Pain Medicine | Admitting: Pain Medicine

## 2012-09-30 DIAGNOSIS — M545 Low back pain: Secondary | ICD-10-CM

## 2012-09-30 DIAGNOSIS — M549 Dorsalgia, unspecified: Secondary | ICD-10-CM

## 2012-10-01 ENCOUNTER — Other Ambulatory Visit: Payer: Self-pay

## 2013-05-22 ENCOUNTER — Other Ambulatory Visit (HOSPITAL_COMMUNITY): Payer: Self-pay | Admitting: Internal Medicine

## 2013-05-22 ENCOUNTER — Ambulatory Visit (HOSPITAL_COMMUNITY): Payer: Medicare HMO | Attending: Cardiovascular Disease | Admitting: Radiology

## 2013-05-22 DIAGNOSIS — R609 Edema, unspecified: Secondary | ICD-10-CM

## 2013-05-22 DIAGNOSIS — I080 Rheumatic disorders of both mitral and aortic valves: Secondary | ICD-10-CM | POA: Insufficient documentation

## 2013-05-22 DIAGNOSIS — E669 Obesity, unspecified: Secondary | ICD-10-CM | POA: Insufficient documentation

## 2013-05-22 DIAGNOSIS — I079 Rheumatic tricuspid valve disease, unspecified: Secondary | ICD-10-CM | POA: Insufficient documentation

## 2013-05-22 NOTE — Progress Notes (Signed)
Echocardiogram performed.  

## 2013-07-03 ENCOUNTER — Inpatient Hospital Stay (HOSPITAL_COMMUNITY)
Admission: EM | Admit: 2013-07-03 | Discharge: 2013-07-10 | DRG: 871 | Disposition: A | Discharge status: Skilled Nursing Facility | Payer: Medicare HMO | Attending: Internal Medicine | Admitting: Internal Medicine

## 2013-07-03 ENCOUNTER — Encounter (HOSPITAL_COMMUNITY): Payer: Self-pay | Admitting: Emergency Medicine

## 2013-07-03 ENCOUNTER — Emergency Department (HOSPITAL_COMMUNITY): Payer: Medicare HMO

## 2013-07-03 DIAGNOSIS — Z79899 Other long term (current) drug therapy: Secondary | ICD-10-CM

## 2013-07-03 DIAGNOSIS — L0390 Cellulitis, unspecified: Secondary | ICD-10-CM

## 2013-07-03 DIAGNOSIS — A419 Sepsis, unspecified organism: Principal | ICD-10-CM | POA: Diagnosis present

## 2013-07-03 DIAGNOSIS — J9600 Acute respiratory failure, unspecified whether with hypoxia or hypercapnia: Secondary | ICD-10-CM | POA: Diagnosis present

## 2013-07-03 DIAGNOSIS — K5900 Constipation, unspecified: Secondary | ICD-10-CM | POA: Diagnosis present

## 2013-07-03 DIAGNOSIS — F319 Bipolar disorder, unspecified: Secondary | ICD-10-CM | POA: Diagnosis present

## 2013-07-03 DIAGNOSIS — R451 Restlessness and agitation: Secondary | ICD-10-CM | POA: Diagnosis present

## 2013-07-03 DIAGNOSIS — I5033 Acute on chronic diastolic (congestive) heart failure: Secondary | ICD-10-CM | POA: Diagnosis present

## 2013-07-03 DIAGNOSIS — Z515 Encounter for palliative care: Secondary | ICD-10-CM

## 2013-07-03 DIAGNOSIS — R5383 Other fatigue: Secondary | ICD-10-CM

## 2013-07-03 DIAGNOSIS — Z6835 Body mass index (BMI) 35.0-35.9, adult: Secondary | ICD-10-CM

## 2013-07-03 DIAGNOSIS — T40601A Poisoning by unspecified narcotics, accidental (unintentional), initial encounter: Secondary | ICD-10-CM | POA: Diagnosis present

## 2013-07-03 DIAGNOSIS — M549 Dorsalgia, unspecified: Secondary | ICD-10-CM | POA: Diagnosis present

## 2013-07-03 DIAGNOSIS — M1990 Unspecified osteoarthritis, unspecified site: Secondary | ICD-10-CM | POA: Diagnosis present

## 2013-07-03 DIAGNOSIS — D72829 Elevated white blood cell count, unspecified: Secondary | ICD-10-CM

## 2013-07-03 DIAGNOSIS — I509 Heart failure, unspecified: Secondary | ICD-10-CM | POA: Diagnosis present

## 2013-07-03 DIAGNOSIS — F19939 Other psychoactive substance use, unspecified with withdrawal, unspecified: Secondary | ICD-10-CM | POA: Diagnosis not present

## 2013-07-03 DIAGNOSIS — E2749 Other adrenocortical insufficiency: Secondary | ICD-10-CM | POA: Diagnosis present

## 2013-07-03 DIAGNOSIS — R531 Weakness: Secondary | ICD-10-CM

## 2013-07-03 DIAGNOSIS — E861 Hypovolemia: Secondary | ICD-10-CM | POA: Diagnosis present

## 2013-07-03 DIAGNOSIS — Z66 Do not resuscitate: Secondary | ICD-10-CM | POA: Diagnosis not present

## 2013-07-03 DIAGNOSIS — F1120 Opioid dependence, uncomplicated: Secondary | ICD-10-CM | POA: Diagnosis present

## 2013-07-03 DIAGNOSIS — I272 Pulmonary hypertension, unspecified: Secondary | ICD-10-CM

## 2013-07-03 DIAGNOSIS — J189 Pneumonia, unspecified organism: Secondary | ICD-10-CM | POA: Diagnosis present

## 2013-07-03 DIAGNOSIS — R6521 Severe sepsis with septic shock: Secondary | ICD-10-CM

## 2013-07-03 DIAGNOSIS — R6520 Severe sepsis without septic shock: Secondary | ICD-10-CM

## 2013-07-03 DIAGNOSIS — I498 Other specified cardiac arrhythmias: Secondary | ICD-10-CM | POA: Diagnosis present

## 2013-07-03 DIAGNOSIS — T50995A Adverse effect of other drugs, medicaments and biological substances, initial encounter: Secondary | ICD-10-CM | POA: Diagnosis not present

## 2013-07-03 DIAGNOSIS — G9340 Encephalopathy, unspecified: Secondary | ICD-10-CM | POA: Diagnosis present

## 2013-07-03 DIAGNOSIS — L03119 Cellulitis of unspecified part of limb: Secondary | ICD-10-CM

## 2013-07-03 DIAGNOSIS — IMO0002 Reserved for concepts with insufficient information to code with codable children: Secondary | ICD-10-CM | POA: Diagnosis present

## 2013-07-03 DIAGNOSIS — L02419 Cutaneous abscess of limb, unspecified: Secondary | ICD-10-CM | POA: Diagnosis present

## 2013-07-03 DIAGNOSIS — I079 Rheumatic tricuspid valve disease, unspecified: Secondary | ICD-10-CM | POA: Diagnosis present

## 2013-07-03 DIAGNOSIS — I2789 Other specified pulmonary heart diseases: Secondary | ICD-10-CM | POA: Diagnosis present

## 2013-07-03 DIAGNOSIS — E876 Hypokalemia: Secondary | ICD-10-CM | POA: Diagnosis present

## 2013-07-03 DIAGNOSIS — G894 Chronic pain syndrome: Secondary | ICD-10-CM | POA: Diagnosis present

## 2013-07-03 LAB — URINALYSIS, ROUTINE W REFLEX MICROSCOPIC
Bilirubin Urine: NEGATIVE
Glucose, UA: NEGATIVE mg/dL
Hgb urine dipstick: NEGATIVE
Ketones, ur: NEGATIVE mg/dL
Leukocytes, UA: NEGATIVE
Nitrite: NEGATIVE
Protein, ur: NEGATIVE mg/dL
Specific Gravity, Urine: 1.018 (ref 1.005–1.030)
Urobilinogen, UA: 0.2 mg/dL (ref 0.0–1.0)
pH: 5.5 (ref 5.0–8.0)

## 2013-07-03 LAB — COMPREHENSIVE METABOLIC PANEL
ALT: 16 U/L (ref 0–35)
AST: 24 U/L (ref 0–37)
Albumin: 3 g/dL — ABNORMAL LOW (ref 3.5–5.2)
Alkaline Phosphatase: 69 U/L (ref 39–117)
BUN: 34 mg/dL — ABNORMAL HIGH (ref 6–23)
CO2: 36 mEq/L — ABNORMAL HIGH (ref 19–32)
Calcium: 10.1 mg/dL (ref 8.4–10.5)
Chloride: 81 mEq/L — ABNORMAL LOW (ref 96–112)
Creatinine, Ser: 1.03 mg/dL (ref 0.50–1.10)
GFR calc Af Amer: 58 mL/min — ABNORMAL LOW (ref >90)
GFR calc non Af Amer: 50 mL/min — ABNORMAL LOW (ref >90)
Glucose, Bld: 122 mg/dL — ABNORMAL HIGH (ref 70–99)
Potassium: 3.3 mEq/L — ABNORMAL LOW (ref 3.7–5.3)
Sodium: 129 mEq/L — ABNORMAL LOW (ref 137–147)
Total Bilirubin: 0.4 mg/dL (ref 0.3–1.2)
Total Protein: 6.9 g/dL (ref 6.0–8.3)

## 2013-07-03 LAB — CBC WITH DIFFERENTIAL/PLATELET
Basophils Absolute: 0 10*3/uL (ref 0.0–0.1)
Basophils Relative: 0 % (ref 0–1)
Eosinophils Absolute: 0 10*3/uL (ref 0.0–0.7)
Eosinophils Relative: 0 % (ref 0–5)
HCT: 31.9 % — ABNORMAL LOW (ref 36.0–46.0)
Hemoglobin: 11.1 g/dL — ABNORMAL LOW (ref 12.0–15.0)
Lymphocytes Relative: 4 % — ABNORMAL LOW (ref 12–46)
Lymphs Abs: 1 10*3/uL (ref 0.7–4.0)
MCH: 33.5 pg (ref 26.0–34.0)
MCHC: 34.8 g/dL (ref 30.0–36.0)
MCV: 96.4 fL (ref 78.0–100.0)
Monocytes Absolute: 0.8 10*3/uL (ref 0.1–1.0)
Monocytes Relative: 3 % (ref 3–12)
Neutro Abs: 23 10*3/uL — ABNORMAL HIGH (ref 1.7–7.7)
Neutrophils Relative %: 93 % — ABNORMAL HIGH (ref 43–77)
Platelets: 301 10*3/uL (ref 150–400)
RBC: 3.31 MIL/uL — ABNORMAL LOW (ref 3.87–5.11)
RDW: 13.3 % (ref 11.5–15.5)
WBC: 24.9 10*3/uL — ABNORMAL HIGH (ref 4.0–10.5)

## 2013-07-03 LAB — I-STAT ARTERIAL BLOOD GAS, ED
Acid-Base Excess: 10 mmol/L — ABNORMAL HIGH (ref 0.0–2.0)
Bicarbonate: 36.5 mEq/L — ABNORMAL HIGH (ref 20.0–24.0)
O2 Saturation: 95 %
Patient temperature: 98.6
TCO2: 38 mmol/L (ref 0–100)
pCO2 arterial: 57.9 mmHg (ref 35.0–45.0)
pH, Arterial: 7.408 (ref 7.350–7.450)
pO2, Arterial: 77 mmHg — ABNORMAL LOW (ref 80.0–100.0)

## 2013-07-03 LAB — I-STAT CG4 LACTIC ACID, ED: Lactic Acid, Venous: 1.06 mmol/L (ref 0.5–2.2)

## 2013-07-03 MED ORDER — VANCOMYCIN HCL IN DEXTROSE 1-5 GM/200ML-% IV SOLN
1000.0000 mg | Freq: Once | INTRAVENOUS | Status: AC
  Administered 2013-07-03: 1000 mg via INTRAVENOUS
  Filled 2013-07-03: qty 200

## 2013-07-03 MED ORDER — SODIUM CHLORIDE 0.9 % IV SOLN
INTRAVENOUS | Status: DC
  Administered 2013-07-04: 01:00:00 via INTRAVENOUS

## 2013-07-03 MED ORDER — SODIUM CHLORIDE 0.9 % IV BOLUS (SEPSIS)
1000.0000 mL | Freq: Once | INTRAVENOUS | Status: AC
Start: 2013-07-03 — End: 2013-07-03
  Administered 2013-07-03: 1000 mL via INTRAVENOUS

## 2013-07-03 MED ORDER — SODIUM CHLORIDE 0.9 % IV BOLUS (SEPSIS)
1000.0000 mL | Freq: Once | INTRAVENOUS | Status: AC
  Administered 2013-07-03: 1000 mL via INTRAVENOUS

## 2013-07-03 MED ORDER — ACETAMINOPHEN 650 MG RE SUPP
650.0000 mg | RECTAL | Status: DC | PRN
  Administered 2013-07-03: 650 mg via RECTAL
  Filled 2013-07-03: qty 1

## 2013-07-03 MED ORDER — PIPERACILLIN-TAZOBACTAM 3.375 G IVPB 30 MIN
3.3750 g | Freq: Once | INTRAVENOUS | Status: AC
  Administered 2013-07-03: 3.375 g via INTRAVENOUS
  Filled 2013-07-03: qty 50

## 2013-07-03 MED ORDER — SODIUM CHLORIDE 0.9 % IV SOLN
Freq: Once | INTRAVENOUS | Status: AC
  Administered 2013-07-03: 1000 mL/h via INTRAVENOUS

## 2013-07-03 MED ORDER — NOREPINEPHRINE BITARTRATE 1 MG/ML IJ SOLN
2.0000 ug/min | Freq: Once | INTRAMUSCULAR | Status: AC
  Administered 2013-07-03: 4 ug/min via INTRAVENOUS
  Filled 2013-07-03 (×4): qty 4

## 2013-07-03 NOTE — ED Notes (Signed)
Pt alert and oriented x 4, Pt denies SOB and Pain at this time.

## 2013-07-03 NOTE — ED Notes (Signed)
EMS and husband reports PT woke up weak and hasn't been acting right. She has been repeating herself and her speech is slightly slurred. PT reports waking up diaphoretic. PT had ECHO last week to r/o CHF d/t pedal edema. EMS reports PERRLA 94% on 6L, [id number], ambulates w/ walker

## 2013-07-03 NOTE — ED Provider Notes (Signed)
CSN: [id number]     Arrival date & time 07/03/13  1911 History   First MD Initiated Contact with Patient 07/03/13 1930     Chief Complaint  Patient presents with  . Weakness  . Altered Mental Status     (Consider location/radiation/quality/duration/timing/severity/associated sxs/prior Treatment) HPI  80 year old female with decreased mental status/lethargy which has been progressively worsening over the past 2-3 days. History is primarily from her husband "Rush Landmark" who is at bedside. Patient has been complaining of generalized fatigue and she has been sleeping a lot. She's been complaining of some pain in her left lower extremity. No acute trauma that he is aware of. Anorexia. Patient is very drowsy but does answer some simple questions. She denies any acute pain aside from her left leg. She does have a history of chronic pain which she is on large doses of opiates for. She denies any respiratory complaints. No cough. Subjective fever and chills. No vomiting or diarrhea. No urinary complaints. Chronic lower extremity edema. Additionally, husband has noticed some redness to her left lower leg in the past day. PCP Dr Minna Antis. Full Code.    Past Medical History  Diagnosis Date  . Arthritis    Past Surgical History  Procedure Laterality Date  . Abdominal hysterectomy    . Back surgery    . Joint replacement    . Colon surgery    . Hernia repair     No family history on file. History  Substance Use Topics  . Smoking status: Never Smoker   . Smokeless tobacco: Not on file  . Alcohol Use: Yes   OB History   Grav Para Term Preterm Abortions TAB SAB Ect Mult Living                 Review of Systems  All systems reviewed and negative, other than as noted in HPI.   Allergies  Review of patient's allergies indicates no known allergies.  Home Medications   Prior to Admission medications   Medication Sig Start Date End Date Taking? Authorizing Provider  furosemide (LASIX) 80 MG tablet  Take 80 mg by mouth.   Yes Historical Provider, MD  methocarbamol (ROBAXIN) 750 MG tablet Take 750 mg by mouth 3 (three) times daily.   Yes Historical Provider, MD  morphine (KADIAN) 100 MG 24 hr capsule Take 100 mg by mouth 3 (three) times daily.   Yes Historical Provider, MD  oxyCODONE-acetaminophen (PERCOCET) 10-325 MG per tablet Take 1 tablet by mouth 3 (three) times daily.   Yes Historical Provider, MD  potassium chloride (KLOR-CON) 20 MEQ packet Take 20 mEq by mouth once.   Yes Historical Provider, MD  temazepam (RESTORIL) 30 MG capsule Take 30 mg by mouth at bedtime as needed for sleep.   Yes Historical Provider, MD  timolol (BETIMOL) 0.5 % ophthalmic solution Place 1 drop into both eyes daily.    Yes Historical Provider, MD   BP 112/80  Pulse 80  Temp(Src) 100.6 F (38.1 C) (Rectal)  Resp 14  Ht 5\' 2"  (1.575 m)  Wt 185 lb (83.915 kg)  BMI 33.83 kg/m2  SpO2 97% Physical Exam  Nursing note and vitals reviewed. Constitutional: She appears well-developed and well-nourished. No distress.  HENT:  Head: Normocephalic and atraumatic.  Mouth/Throat: Oropharynx is clear and moist.  Eyes: Conjunctivae are normal. Pupils are equal, round, and reactive to light. Right eye exhibits no discharge. Left eye exhibits no discharge.  Neck: Neck supple.  Cardiovascular: Normal rate, regular rhythm  and normal heart sounds.  Exam reveals no gallop and no friction rub.   No murmur heard. Pulmonary/Chest: Effort normal. No respiratory distress.  Decreased breath sounds b/l  Abdominal: Soft. She exhibits no distension. There is no tenderness.  Musculoskeletal: She exhibits no edema and no tenderness.  Neurological:  Drowsy. Will answer when spoken to, but falls asleep when not continually engaged. Speech slow but understandable. Mostly appropriate. Moving all extremities. No facial droop.   Skin: Skin is warm and dry.  Poorly demarcated area of faint erythema to distal L leg. No breaks in skin noted.  Perhaps some mild increased warmth and tenderness.     ED Course  Procedures (including critical care time)  CRITICAL CARE Performed by: Virgel Manifold  Total critical care time: 45 minutes  Critical care time was exclusive of separately billable procedures and treating other patients. Critical care was necessary to treat or prevent imminent or life-threatening deterioration. Critical care was time spent personally by me on the following activities: development of treatment plan with patient and/or surrogate as well as nursing, discussions with consultants, evaluation of patient's response to treatment, examination of patient, obtaining history from patient or surrogate, ordering and performing treatments and interventions, ordering and review of laboratory studies, ordering and review of radiographic studies, pulse oximetry and re-evaluation of patient's condition.  Labs Review Labs Reviewed  CBC WITH DIFFERENTIAL - Abnormal; Notable for the following:    WBC 24.9 (*)    RBC 3.31 (*)    Hemoglobin 11.1 (*)    HCT 31.9 (*)    Neutrophils Relative % 93 (*)    Neutro Abs 23.0 (*)    Lymphocytes Relative 4 (*)    All other components within normal limits  COMPREHENSIVE METABOLIC PANEL - Abnormal; Notable for the following:    Sodium 129 (*)    Potassium 3.3 (*)    Chloride 81 (*)    CO2 36 (*)    Glucose, Bld 122 (*)    BUN 34 (*)    Albumin 3.0 (*)    GFR calc non Af Amer 50 (*)    GFR calc Af Amer 58 (*)    All other components within normal limits  I-STAT ARTERIAL BLOOD GAS, ED - Abnormal; Notable for the following:    pCO2 arterial 57.9 (*)    pO2, Arterial 77.0 (*)    Bicarbonate 36.5 (*)    Acid-Base Excess 10.0 (*)    All other components within normal limits  CULTURE, BLOOD (ROUTINE X 2)  CULTURE, BLOOD (ROUTINE X 2)  URINALYSIS, ROUTINE W REFLEX MICROSCOPIC  I-STAT CG4 LACTIC ACID, ED    Imaging Review Dg Chest Port 1 View  (if Code Sepsis Called)  07/03/2013    CLINICAL DATA:  Sepsis  EXAM: PORTABLE CHEST - 1 VIEW  COMPARISON:  DG CHEST 2 VIEW dated 04/13/2010; DG SHOULDER*L*PORT dated 04/19/2010  FINDINGS: The heart remains enlarged. Vascular congestion and diffuse interstitial edema are present. Right hemidiaphragm remains elevated. Spinal stimulator has been removed. Left total shoulder arthroplasty has been placed. It is stable compared 04/19/2010. No pneumothorax. Mild cardiomegaly.  IMPRESSION: CHF with interstitial edema and vascular congestion.   Electronically Signed   By: Maryclare Bean M.D.   On: 07/03/2013 20:12     EKG Interpretation None      MDM   Final diagnoses:  Septic shock  Cellulitis    82yf with severe sepsis. Questionable left lower extremity cellulitis with a report of increased pain and  some mild erythema on exam. No alternative source of infection at this time otherwise. Empiric antibiotics. Blood culture drawn. Patient with hypotension which has not responded to fluid resuscitation.  Initially discussed case with critical care. Given her normal lactic acid and renal function, Dr Nelda Marseille felt pt could be admitted to step down on the medicine service. Dr Marin Comment with the hospitalist service evaluated her but pt had to subsequently had to be started on pressors which cannot be managed on floor or stepdown.   Virgel Manifold, MD 07/03/13 [phone]

## 2013-07-03 NOTE — Consult Note (Signed)
PULMONARY / CRITICAL CARE MEDICINE   Name: Monique Russo MRN: [id number] DOB: 1930-04-30    ADMISSION DATE:  07/03/2013 CONSULTATION DATE:  07/03/2013  REFERRING MD :  EDP PRIMARY SERVICE: TRH  CHIEF COMPLAINT:    BRIEF PATIENT DESCRIPTION: 80 year old female with history of chronic back pain and DJD presented to ED with altered mental status. In ED found to be hypotensive and AMS. Mentation improved with IVF. PCCM asked to see. Of note she takes large doses of pain medications daily for her back pain.   SIGNIFICANT EVENTS / STUDIES:    LINES / TUBES: PIV  CULTURES: Blood 4/16 >>> Urine 4/16 >>> Sputum 4/16 >>>  ANTIBIOTICS: Zosyn 4/16 >>> Vanc 4/16 >>>  HISTORY OF PRESENT ILLNESS:  80 year old female with PMH as below. Her husband noticed her being more tired than normal the past few days. Today after returning from running some errands he had trouble waking her up. When he did, she was lethargic. He called EMS. Upon presentation to the ED she was hypotensive and AMS. She was given 3 liters of crystalloid IVF. She was also started on norepi. Norepi was stopped and she was able to maintain her blood pressure. Her mentation has improved significantly since that time. PCCM asked to admit.   PAST MEDICAL HISTORY :  Past Medical History  Diagnosis Date  . Arthritis    Past Surgical History  Procedure Laterality Date  . Abdominal hysterectomy    . Back surgery    . Joint replacement    . Colon surgery    . Hernia repair     Prior to Admission medications   Medication Sig Start Date End Date Taking? Authorizing Provider  furosemide (LASIX) 80 MG tablet Take 80 mg by mouth.   Yes Historical Provider, MD  methocarbamol (ROBAXIN) 750 MG tablet Take 750 mg by mouth 3 (three) times daily.   Yes Historical Provider, MD  morphine (KADIAN) 100 MG 24 hr capsule Take 100 mg by mouth 3 (three) times daily.   Yes Historical Provider, MD  oxyCODONE-acetaminophen (PERCOCET) 10-325 MG  per tablet Take 1 tablet by mouth 3 (three) times daily.   Yes Historical Provider, MD  potassium chloride (KLOR-CON) 20 MEQ packet Take 20 mEq by mouth once.   Yes Historical Provider, MD  temazepam (RESTORIL) 30 MG capsule Take 30 mg by mouth at bedtime as needed for sleep.   Yes Historical Provider, MD  timolol (BETIMOL) 0.5 % ophthalmic solution Place 1 drop into both eyes daily.    Yes Historical Provider, MD   No Known Allergies  FAMILY HISTORY:  No family history on file. SOCIAL HISTORY:  reports that she has never smoked. She does not have any smokeless tobacco history on file. She reports that she drinks alcohol. Her drug history is not on file.  REVIEW OF SYSTEMS:     Bolds are positive  Constitutional: weight loss, gain, night sweats, Fevers, chills, fatigue .  HEENT: headaches, Sore throat, sneezing, nasal congestion, post nasal drip, Difficulty swallowing, Tooth/dental problems, visual complaints visual changes, ear ache CV:  chest pain, radiates: ,Orthopnea, PND, swelling in lower extremities, dizziness, palpitations, syncope.  GI  heartburn, indigestion, abdominal pain, nausea, vomiting, diarrhea, change in bowel habits, loss of appetite, bloody stools.  Resp: cough, productive yellow sputum: , hemoptysis, dyspnea, chest pain, pleuritic.  Skin: rash or itching or icterus GU: dysuria, change in color of urine, urgency or frequency. flank pain, hematuria  MS: joint pain  or swelling. decreased range of motion  Psych: change in mood or affect. depression or anxiety.  Neuro: difficulty with speech, weakness, numbness, ataxia    SUBJECTIVE:   VITAL SIGNS: Temp:  [100.6 F (38.1 C)-101.6 F (38.7 C)] 100.6 F (38.1 C) (04/16 2141) Pulse Rate:  [73-90] 80 (04/16 2305) Resp:  [9-27] 17 (04/16 2305) BP: (81-148)/(32-95) 91/51 mmHg (04/16 2305) SpO2:  [94 %-99 %] 96 % (04/16 2305) Weight:  [83.915 kg (185 lb)] 83.915 kg (185 lb) (04/16 1925) HEMODYNAMICS:   VENTILATOR  SETTINGS:   INTAKE / OUTPUT: Intake/Output     04/16 0701 - 04/17 0700   I.V. (mL/kg) 2000 (23.8)   Total Intake(mL/kg) 2000 (23.8)   Urine (mL/kg/hr) 300   Total Output 300   Net +1700         PHYSICAL EXAMINATION: General:  Obese female in no acute distress Neuro:  Alert, oriented. Carries on conversation with no difficulties or confusion. HEENT:  [place]/AT, PERRL Cardiovascular:  RRR, significant peripheral BLE edema.  Lungs: Diminished bilaterally Abdomen:  Rotund, non-tender Musculoskeletal:  No acute deformity.  Skin:  BLE edema, questionable LLE erythema.   LABS:  CBC  Recent Labs Lab 07/03/13 1923  WBC 24.9*  HGB 11.1*  HCT 31.9*  PLT 301   Coag's No results found for this basename: APTT, INR,  in the last 168 hours BMET  Recent Labs Lab 07/03/13 1923  NA 129*  K 3.3*  CL 81*  CO2 36*  BUN 34*  CREATININE 1.03  GLUCOSE 122*   Electrolytes  Recent Labs Lab 07/03/13 1923  CALCIUM 10.1   Sepsis Markers  Recent Labs Lab 07/03/13 1944  LATICACIDVEN 1.06   ABG  Recent Labs Lab 07/03/13 2224  PHART 7.408  PCO2ART 57.9*  PO2ART 77.0*   Liver Enzymes  Recent Labs Lab 07/03/13 1923  AST 24  ALT 16  ALKPHOS 69  BILITOT 0.4  ALBUMIN 3.0*   Cardiac Enzymes No results found for this basename: TROPONINI, PROBNP,  in the last 168 hours Glucose No results found for this basename: GLUCAP,  in the last 168 hours  Imaging Dg Chest Port 1 View  (if Code Sepsis Called)  07/03/2013   CLINICAL DATA:  Sepsis  EXAM: PORTABLE CHEST - 1 VIEW  COMPARISON:  DG CHEST 2 VIEW dated 04/13/2010; Darletta Moll SHOULDER*L*PORT dated 04/19/2010  FINDINGS: The heart remains enlarged. Vascular congestion and diffuse interstitial edema are present. Right hemidiaphragm remains elevated. Spinal stimulator has been removed. Left total shoulder arthroplasty has been placed. It is stable compared 04/19/2010. No pneumothorax. Mild cardiomegaly.  IMPRESSION: CHF with interstitial  edema and vascular congestion.   Electronically Signed   By: Maryclare Bean M.D.   On: 07/03/2013 20:12     ASSESSMENT / PLAN:  PULMONARY A:  Pulmonary edema Hypoxia  P:   Supplemental O2 as needed to keep SpO2 greater than 92% Diurese as BP tolerates.  Follow CXR Check PCT  CARDIOVASCULAR A:  Hypotension Doubt septic shock in setting of normal lactate (1.06), adequate UOP, and mental status at baseline per family. Probable acute CHF  P:  IVF resuscitation Trend lactate Hold home lasix Check pro-bnp 2d Echo  RENAL A:   Hypovolemia (BUN/Creat 34:1 at admission) Hypokalemia  P:   Gentle IVF resuscitation in setting of likely acute CHF.  K repletion per primary team Trend BMP  GASTROINTESTINAL A:  No acute issues P:   Management per primary team  HEMATOLOGIC A:  Leukocytosis  P:  Trend CBC  INFECTIOUS A:   Sepsis - etiology uncertain, no PNA on chest film, UA appears WNL. ?LE cellulitis?  P:   Empiric vanc/zosyn Follow cultures  ENDOCRINE A:  No acute issues    P:   Check tsh  NEUROLOGIC A:   Mild lethargy P:   Monitor  PCCM will continue to follow.  Georgann Housekeeper, ACNP [place] [facility]/Critical [facility] Pager [phone] or [phone]  Reviewed above, and examined.  She has sepsis likely from cellulitis >> no other obvious source.  Continue IV fluids for now.  If no further improvement, then will need to have CVL and pressors.    Updated family.  CC time 35 minutes.  Chesley Mires, MD [facility] Pulmonary/Critical Care 07/04/2013, 6:31 AM Pager:  [phone] After 3pm call: [phone]

## 2013-07-03 NOTE — H&P (Signed)
[facility] History and Physical  Gwenivere Hiraldo RSW:[id number] DOB: [birth year]    PCP:   NONE.  Chief Complaint:  Feeling malaise.  HPI: Monique Russo is an 80 y.o. female with hx of arthritis, chronic pain syndrome on high dose morphine under pain management ( Long acting 100mg  morphine TID, along with Percocet PRN), brought to the ER feeling malaise for one day.  She has been coughing for a few days, mostly nonproductive.  In the ER, work up included marked leukocytosis with WBC of 25K, Hb of 11 grams per dL, and normal renal Fx test.  Her CXR showed vascular congestion and intersitial edema.  She was also hypotensive with SBP of 80's.  Her UA showed no evidence of infection and her lactic acid was not elevated.  She does have an erythema of her left lower extremity.  She was given 2.5 L of IVF already, and still hypotensive.  She was subsequently started on Levophed, with resulting BP of 104.  She was lethargic, but arousable.  Her ABD showed pH of 7.4, with pCO2 of 58, and pa Ox of 77.  She was consulted with Dr Nelda Marseille of PCCM by EDP as she was felt to be in septic shock, with leukocytosis, and hypotension, and was on Levophed, however, it was recommended that she be admitted to hosptalist service.  I saw her and admitted her, however, it was policy that since she was on Levophed, she has to be in the ICU, and ICU is a closed unit.  PCCM was again consulted, and Georgann Housekeeper evaluated her in the ER, felt that she can be off of Levophed, so it was discontinued.  She was now recommended to be admitted to the SDU.  Hospitalist was asked to admit her to the SDU.  Rewiew of Systems:   I was not able to obtain any meaningful ROS directly from her.  Family provided her symptom history.   Past Medical History  Diagnosis Date  . Arthritis     Past Surgical History  Procedure Laterality Date  . Abdominal hysterectomy    . Back surgery    . Joint replacement    . Colon surgery    .  Hernia repair      Medications:  HOME MEDS: Prior to Admission medications   Medication Sig Start Date End Date Taking? Authorizing Provider  furosemide (LASIX) 80 MG tablet Take 80 mg by mouth.   Yes Historical Provider, MD  methocarbamol (ROBAXIN) 750 MG tablet Take 750 mg by mouth 3 (three) times daily.   Yes Historical Provider, MD  morphine (KADIAN) 100 MG 24 hr capsule Take 100 mg by mouth 3 (three) times daily.   Yes Historical Provider, MD  oxyCODONE-acetaminophen (PERCOCET) 10-325 MG per tablet Take 1 tablet by mouth 3 (three) times daily.   Yes Historical Provider, MD  potassium chloride (KLOR-CON) 20 MEQ packet Take 20 mEq by mouth once.   Yes Historical Provider, MD  temazepam (RESTORIL) 30 MG capsule Take 30 mg by mouth at bedtime as needed for sleep.   Yes Historical Provider, MD  timolol (BETIMOL) 0.5 % ophthalmic solution Place 1 drop into both eyes daily.    Yes Historical Provider, MD     Allergies:  No Known Allergies  Social History:   reports that she has never smoked. She does not have any smokeless tobacco history on file. She reports that she drinks alcohol. Her drug history is not on file.  Family History: No  family history on file.   Physical Exam: Filed Vitals:   07/03/13 2155 07/03/13 2200 07/03/13 2205 07/03/13 2210  BP: 109/64 104/58 101/65 106/62  Pulse: 75 74 74 73  Temp:      TempSrc:      Resp: 11 9 10 17   Height:      Weight:      SpO2: 96% 96% 96% 96%   Blood pressure 106/62, pulse 73, temperature 100.6 F (38.1 C), temperature source Rectal, resp. rate 17, height 5\' 2"  (1.575 m), weight 83.915 kg (185 lb), SpO2 96.00%.  GEN: She appears ill, pale and lethargic, but arousable.  PSYCH:  does not appear anxious or depressed; affect is appropriate. HEENT: Mucous membranes pink and anicteric; PERRLA; EOM intact; no cervical lymphadenopathy nor thyromegaly or carotid bruit; no JVD; There were no stridor. Neck is very supple. Breasts:: Not  examined CHEST WALL: No tenderness CHEST: Normal respiration, clear to auscultation bilaterally.  HEART: Regular rate and rhythm.  There are no murmur, rub, or gallops.   BACK: No kyphosis or scoliosis; no CVA tenderness ABDOMEN: soft and non-tender; no masses, no organomegaly, normal abdominal bowel sounds; no pannus; no intertriginous candida. There is no rebound and no distention. Rectal Exam: Not done EXTREMITIES: No bone or joint deformity; age-appropriate arthropathy of the hands and knees; no edema; no ulcerations.  There is no calf tenderness. Genitalia: not examined PULSES: 2+ and symmetric SKIN: Normal hydration no rash or ulceration.  She has slight erythema of the lower extremity. CNS Incomplete exam due to her not cooperating, but she moves all four extremities and had facial symmetry with grimaces.    Labs on Admission:  Basic Metabolic Panel:  Recent Labs Lab 07/03/13 1923  NA 129*  K 3.3*  CL 81*  CO2 36*  GLUCOSE 122*  BUN 34*  CREATININE 1.03  CALCIUM 10.1   Liver Function Tests:  Recent Labs Lab 07/03/13 1923  AST 24  ALT 16  ALKPHOS 69  BILITOT 0.4  PROT 6.9  ALBUMIN 3.0*   No results found for this basename: LIPASE, AMYLASE,  in the last 168 hours No results found for this basename: AMMONIA,  in the last 168 hours CBC:  Recent Labs Lab 07/03/13 1923  WBC 24.9*  NEUTROABS 23.0*  HGB 11.1*  HCT 31.9*  MCV 96.4  PLT 301   Cardiac Enzymes: No results found for this basename: CKTOTAL, CKMB, CKMBINDEX, TROPONINI,  in the last 168 hours  CBG: No results found for this basename: GLUCAP,  in the last 168 hours   Radiological Exams on Admission: Dg Chest Port 1 View  (if Code Sepsis Called)  07/03/2013   CLINICAL DATA:  Sepsis  EXAM: PORTABLE CHEST - 1 VIEW  COMPARISON:  DG CHEST 2 VIEW dated 04/13/2010; Darletta Moll SHOULDER*L*PORT dated 04/19/2010  FINDINGS: The heart remains enlarged. Vascular congestion and diffuse interstitial edema are present. Right  hemidiaphragm remains elevated. Spinal stimulator has been removed. Left total shoulder arthroplasty has been placed. It is stable compared 04/19/2010. No pneumothorax. Mild cardiomegaly.  IMPRESSION: CHF with interstitial edema and vascular congestion.   Electronically Signed   By: Maryclare Bean M.D.   On: 07/03/2013 20:12    Assessment/Plan Present on Admission:  . Narcotic overdose . CHF (congestive heart failure) . Back pain . Sepsis  PLAN:  Although there were no definite source, and lactic acid was not elevated, she presented clinically as sepsis.  She did have some coughs, and leukocytosis, which would suggest a  pulmonary source, however, her CXR only showed pulmonary vascular congestion.  Her hypotension could be from either sepsis, or narcotic overdose, as she has been on high dose of Morphine.  This is not a new dose for her however.  The difficult management for her would be keep her BP up, but unable to give her IVF as she may be in CHF.  She most likely will need pressor again.  Blood culture and urine cultures have been obtained.  We will give her broad spectrum IV antibiotics Lucianne Lei and Zosyn IV).  Will hold off her morphine at this time.  She will be given IV NS but will try to avoid fluid overload.  I discussed code status with her family, and confirmed that she is a full code.  I have told the family of her condition, including hypotension in the face of CHF, possible sepsis without a definite source, and though she has been on narcotics a longtime, her hypotension could very well be from the high dose morphine. They understand that she is critically ill.  All questions were answered.  She will therefore be admitted to SDU under [facility] service.  Thank you for allowing me to participate in her care.  Other plans as per orders.  Code Status: FULL CODE.   Orvan Falconer, MD. [facility] Pager [phone] 7pm to 7am.  07/03/2013, 10:24 PM

## 2013-07-03 NOTE — ED Notes (Signed)
NP Hoffman at the bedside updating pt and family.

## 2013-07-04 ENCOUNTER — Inpatient Hospital Stay (HOSPITAL_COMMUNITY): Payer: Medicare HMO

## 2013-07-04 DIAGNOSIS — L0291 Cutaneous abscess, unspecified: Secondary | ICD-10-CM

## 2013-07-04 DIAGNOSIS — M79609 Pain in unspecified limb: Secondary | ICD-10-CM

## 2013-07-04 DIAGNOSIS — L0390 Cellulitis, unspecified: Secondary | ICD-10-CM

## 2013-07-04 DIAGNOSIS — I509 Heart failure, unspecified: Secondary | ICD-10-CM

## 2013-07-04 LAB — PRO B NATRIURETIC PEPTIDE: Pro B Natriuretic peptide (BNP): 1646 pg/mL — ABNORMAL HIGH (ref 0–450)

## 2013-07-04 LAB — MRSA PCR SCREENING: MRSA BY PCR: NEGATIVE

## 2013-07-04 LAB — CORTISOL: Cortisol, Plasma: 3 ug/dL

## 2013-07-04 LAB — CBC
HCT: 28.1 % — ABNORMAL LOW (ref 36.0–46.0)
Hemoglobin: 9.5 g/dL — ABNORMAL LOW (ref 12.0–15.0)
MCH: 33.1 pg (ref 26.0–34.0)
MCHC: 33.8 g/dL (ref 30.0–36.0)
MCV: 97.9 fL (ref 78.0–100.0)
PLATELETS: 257 10*3/uL (ref 150–400)
RBC: 2.87 MIL/uL — AB (ref 3.87–5.11)
RDW: 13.6 % (ref 11.5–15.5)
WBC: 18.3 10*3/uL — ABNORMAL HIGH (ref 4.0–10.5)

## 2013-07-04 LAB — LACTIC ACID, PLASMA: Lactic Acid, Venous: 2.2 mmol/L (ref 0.5–2.2)

## 2013-07-04 LAB — TROPONIN I
Troponin I: <0.3 ng/mL (ref <0.30)
Troponin I: <0.3 ng/mL (ref <0.30)
Troponin I: <0.3 ng/mL (ref <0.30)

## 2013-07-04 LAB — TSH
TSH: 0.965 u[IU]/mL (ref 0.350–4.500)
TSH: 1.06 u[IU]/mL (ref 0.350–4.500)

## 2013-07-04 LAB — BASIC METABOLIC PANEL
BUN: 23 mg/dL (ref 6–23)
CO2: 30 meq/L (ref 19–32)
CREATININE: 0.84 mg/dL (ref 0.50–1.10)
Calcium: 8.6 mg/dL (ref 8.4–10.5)
Chloride: 93 mEq/L — ABNORMAL LOW (ref 96–112)
GFR calc Af Amer: 74 mL/min — ABNORMAL LOW (ref >90)
GFR calc non Af Amer: 64 mL/min — ABNORMAL LOW (ref >90)
GLUCOSE: 158 mg/dL — AB (ref 70–99)
Potassium: 3 mEq/L — ABNORMAL LOW (ref 3.7–5.3)
Sodium: 135 mEq/L — ABNORMAL LOW (ref 137–147)

## 2013-07-04 LAB — GLUCOSE, CAPILLARY: Glucose-Capillary: 115 mg/dL — ABNORMAL HIGH (ref 70–99)

## 2013-07-04 LAB — PROCALCITONIN: Procalcitonin: 0.4 ng/mL

## 2013-07-04 MED ORDER — TIMOLOL MALEATE 0.5 % OP SOLN
1.0000 [drp] | Freq: Every day | OPHTHALMIC | Status: DC
  Administered 2013-07-04 – 2013-07-10 (×6): 1 [drp] via OPHTHALMIC
  Filled 2013-07-04 (×2): qty 5

## 2013-07-04 MED ORDER — HALOPERIDOL LACTATE 5 MG/ML IJ SOLN: 5.0000 mg | Freq: Four times a day (QID) | INTRAMUSCULAR | Status: DC | PRN

## 2013-07-04 MED ORDER — SODIUM CHLORIDE 0.9 % IV BOLUS (SEPSIS): 500.0000 mL | Freq: Once | INTRAVENOUS | Status: DC

## 2013-07-04 MED ORDER — SODIUM CHLORIDE 0.9 % IJ SOLN
3.0000 mL | Freq: Two times a day (BID) | INTRAMUSCULAR | Status: DC
  Administered 2013-07-04 – 2013-07-10 (×10): 3 mL via INTRAVENOUS

## 2013-07-04 MED ORDER — SODIUM CHLORIDE 0.9 % IV BOLUS (SEPSIS): 1000.0000 mL | Freq: Once | INTRAVENOUS | Status: DC

## 2013-07-04 MED ORDER — FENTANYL CITRATE 0.05 MG/ML IJ SOLN
25.0000 ug | INTRAMUSCULAR | Status: DC | PRN
  Administered 2013-07-04 – 2013-07-05 (×5): 25 ug via INTRAVENOUS
  Filled 2013-07-04 (×5): qty 2

## 2013-07-04 MED ORDER — FENTANYL CITRATE 0.05 MG/ML IJ SOLN
INTRAMUSCULAR | Status: AC
  Administered 2013-07-04: 25 ug
  Filled 2013-07-04: qty 2

## 2013-07-04 MED ORDER — PIPERACILLIN-TAZOBACTAM 3.375 G IVPB 30 MIN: 3.3750 g | Freq: Three times a day (TID) | INTRAVENOUS | Status: DC

## 2013-07-04 MED ORDER — ACETAMINOPHEN 325 MG PO TABS
650.0000 mg | ORAL_TABLET | Freq: Four times a day (QID) | ORAL | Status: DC | PRN
  Administered 2013-07-04 – 2013-07-08 (×3): 650 mg via ORAL
  Filled 2013-07-04 (×2): qty 2

## 2013-07-04 MED ORDER — KCL IN DEXTROSE-NACL 20-5-0.9 MEQ/L-%-% IV SOLN
INTRAVENOUS | Status: DC
  Administered 2013-07-04: 02:00:00 via INTRAVENOUS
  Filled 2013-07-04 (×4): qty 1000

## 2013-07-04 MED ORDER — SODIUM CHLORIDE 0.9 % IV BOLUS (SEPSIS)
500.0000 mL | Freq: Once | INTRAVENOUS | Status: AC
  Administered 2013-07-04: 500 mL via INTRAVENOUS

## 2013-07-04 MED ORDER — BISACODYL 10 MG RE SUPP
10.0000 mg | Freq: Every day | RECTAL | Status: DC | PRN
  Administered 2013-07-04: 10 mg via RECTAL
  Filled 2013-07-04: qty 1

## 2013-07-04 MED ORDER — DEXMEDETOMIDINE HCL IN NACL 200 MCG/50ML IV SOLN
0.4000 ug/kg/h | INTRAVENOUS | Status: DC
  Administered 2013-07-04: 0.1 ug/kg/h via INTRAVENOUS
  Administered 2013-07-04 – 2013-07-05 (×2): 0.5 ug/kg/h via INTRAVENOUS
  Filled 2013-07-04 (×3): qty 50

## 2013-07-04 MED ORDER — SODIUM CHLORIDE 0.9 % IV SOLN
INTRAVENOUS | Status: DC
  Administered 2013-07-04: 21:00:00 via INTRAVENOUS
  Administered 2013-07-09: 10 mL/h via INTRAVENOUS

## 2013-07-04 MED ORDER — SENNOSIDES-DOCUSATE SODIUM 8.6-50 MG PO TABS
1.0000 | ORAL_TABLET | Freq: Two times a day (BID) | ORAL | Status: DC
  Administered 2013-07-04 – 2013-07-10 (×7): 1 via ORAL
  Filled 2013-07-04 (×12): qty 1

## 2013-07-04 MED ORDER — PIPERACILLIN-TAZOBACTAM 3.375 G IVPB
3.3750 g | Freq: Three times a day (TID) | INTRAVENOUS | Status: DC
  Administered 2013-07-04 – 2013-07-05 (×4): 3.375 g via INTRAVENOUS
  Filled 2013-07-04 (×6): qty 50

## 2013-07-04 MED ORDER — HEPARIN SODIUM (PORCINE) 5000 UNIT/ML IJ SOLN
5000.0000 [IU] | Freq: Three times a day (TID) | INTRAMUSCULAR | Status: DC
  Administered 2013-07-04 – 2013-07-07 (×9): 5000 [IU] via SUBCUTANEOUS
  Filled 2013-07-04 (×12): qty 1

## 2013-07-04 MED ORDER — PIPERACILLIN-TAZOBACTAM 3.375 G IVPB
3.3750 g | Freq: Three times a day (TID) | INTRAVENOUS | Status: DC
  Filled 2013-07-04 (×2): qty 50

## 2013-07-04 MED ORDER — NALOXONE HCL 1 MG/ML IJ SOLN
0.2500 mg/h | INTRAMUSCULAR | Status: DC
  Filled 2013-07-04: qty 4

## 2013-07-04 MED ORDER — FENTANYL CITRATE 0.05 MG/ML IJ SOLN
INTRAMUSCULAR | Status: AC
  Filled 2013-07-04: qty 2

## 2013-07-04 MED ORDER — BIOTENE DRY MOUTH MT LIQD
15.0000 mL | Freq: Two times a day (BID) | OROMUCOSAL | Status: DC
Start: 2013-07-04 — End: 2013-07-10
  Administered 2013-07-04 – 2013-07-10 (×13): 15 mL via OROMUCOSAL

## 2013-07-04 MED ORDER — PANTOPRAZOLE SODIUM 40 MG IV SOLR
40.0000 mg | INTRAVENOUS | Status: DC
  Administered 2013-07-04: 40 mg via INTRAVENOUS
  Filled 2013-07-04 (×2): qty 40

## 2013-07-04 MED ORDER — NOREPINEPHRINE BITARTRATE 1 MG/ML IJ SOLN
2.0000 ug/min | INTRAMUSCULAR | Status: DC
Start: 2013-07-04 — End: 2013-07-07
  Administered 2013-07-04: 3.013 ug/min via INTRAVENOUS
  Filled 2013-07-04 (×2): qty 4

## 2013-07-04 MED ORDER — HYDROCORTISONE SOD SUCCINATE 100 MG IJ SOLR
50.0000 mg | Freq: Four times a day (QID) | INTRAMUSCULAR | Status: DC
  Administered 2013-07-04 – 2013-07-07 (×12): 50 mg via INTRAVENOUS
  Filled 2013-07-04 (×16): qty 1

## 2013-07-04 MED ORDER — NALOXONE HCL 0.4 MG/ML IJ SOLN
0.4000 mg | Freq: Once | INTRAMUSCULAR | Status: AC
  Administered 2013-07-04: 0.4 mg via INTRAVENOUS

## 2013-07-04 MED ORDER — HALOPERIDOL LACTATE 5 MG/ML IJ SOLN
INTRAMUSCULAR | Status: AC
  Filled 2013-07-04: qty 1

## 2013-07-04 MED ORDER — VANCOMYCIN HCL 10 G IV SOLR
1250.0000 mg | INTRAVENOUS | Status: DC
  Administered 2013-07-04: 1250 mg via INTRAVENOUS
  Filled 2013-07-04 (×2): qty 1250

## 2013-07-04 MED ORDER — FENTANYL CITRATE 0.05 MG/ML IJ SOLN: 25.0000 ug | Freq: Once | INTRAMUSCULAR | Status: DC

## 2013-07-04 NOTE — Progress Notes (Signed)
ANTIBIOTIC CONSULT NOTE - INITIAL  Pharmacy Consult for Vancomycin/Zosyn  Indication: rule out sepsis  No Known Allergies  Patient Measurements: Height: 5\' 2"  (157.5 cm) Weight: 185 lb (83.915 kg) IBW/kg (Calculated) : 50.1  Vital Signs: Temp: 100.6 F (38.1 C) (04/16 2141) Temp src: Rectal (04/16 2141) BP: 90/48 mmHg (04/17 0030) Pulse Rate: 72 (04/17 0030)  Labs:  Recent Labs  07/03/13 1923  WBC 24.9*  HGB 11.1*  PLT 301  CREATININE 1.03   Estimated Creatinine Clearance: 43.7 ml/min (by C-G formula based on Cr of 1.03).  Assessment: 80 y/o F here with AMS, found to have leukocytosis (WBC 24.9), hypotensive, to start broad spectrum antibiotics, other labs as above.   Goal of Therapy:  Vancomycin trough level 15-20 mcg/ml  Plan:  -Vancomycin 1250 mg IV q24h -Zosyn 3.375G IV q8h to be infused over 4 hours -Trend WBC, temp, renal function  -Drug levels as indicated   Monique Russo 07/04/2013,12:51 AM

## 2013-07-04 NOTE — Procedures (Signed)
Central Venous Catheter Insertion Procedure Note Monique Russo [id number] 22-Oct-1930  Procedure: Insertion of Central Venous Catheter Indications: Assessment of intravascular volume, Drug and/or fluid administration and Frequent blood sampling  Procedure Details Consent: Risks of procedure as well as the alternatives and risks of each were explained to the (patient/caregiver).  Consent for procedure obtained. Time Out: Verified patient identification, verified procedure, site/side was marked, verified correct patient position, special equipment/implants available, medications/allergies/relevent history reviewed, required imaging and test results available.  Performed  Maximum sterile technique was used including antiseptics, cap, gloves, gown, hand hygiene, mask and sheet. Skin prep: Chlorhexidine; local anesthetic administered A antimicrobial bonded/coated triple lumen catheter was placed in the left internal jugular vein using the Seldinger technique. Ultrasound guidance used.yes Catheter placed to 20 cm. Blood aspirated via all 3 ports and then flushed x 3. Line sutured x 2 and dressing applied.  Evaluation Blood flow good Complications: No apparent complications Patient did tolerate procedure well. Chest X-ray ordered to verify placement.  CXR: pending.  Richardson Landry Monique Russo ACNP [facility] PCCM Pager [phone] till 3 pm If no answer page [phone] 07/04/2013, 11:22 AM

## 2013-07-04 NOTE — Progress Notes (Signed)
Narcan .4 given per order Dr. Earnest Conroy. Pt awoke and was yawning and irritable. She is alert and blood pressure

## 2013-07-04 NOTE — Progress Notes (Signed)
Bilateral lower extremity venous duplex:  No evidence of DVT, superficial thrombosis, or Baker's Cyst.   

## 2013-07-04 NOTE — Plan of Care (Signed)
Remains hypotensive after 3.5 L IVFs overnight. Oxygenation has actually IMPROVED from 5L to 2L. Was on Lasix 80 mg at home so suspect given presentation VERY DRY. Will give additional 500 cc NS bolus and cont current IVFs at 100/hr. No signs or clinical data to support infection/sepsis as etiology. ECHO from March 2015 with preserved LV fnx so no indications to repeat this admit unless experiences significant cardiac event. PCT mildly elevated at 0.40 and lactic acid has trended up slightly to 2.2 which is reflective of low perfusion likely from [facility].  Erin Hearing, ANP

## 2013-07-04 NOTE — Progress Notes (Signed)
  [place] TEAM 1 - Stepdown/ICU TEAM  Pt seen in f/u this AM.  Noted pt was originally on Levophed in ER.  Levophed was stopped on suggestion of PCCM, and pt admitted to [facility] to SDU bed (though in ICU).  Despite volume resuscitation, pt remains hypotensive, w/ MAP in the 40s this morning.  She has developed a LLL infiltrate on f/u CXR suggestive of a CAP w/ sepsis.    I have spoken w/ Dr. Earnest Conroy.  given my concern that she has severe sepsis (not responsive to volume) and will need ongoing/return to pressor support, she will need to be on the PCCM service (as per the rules established by Dr. Gordy Savers group).  I have changed the attending MD to Dr. Earnest Conroy and [facility] will sign off.  We are happy to resume her care once she is proven to be stable off pressors.  Cherene Altes, MD [facility] For Consults/Admissions - Flow Manager - [phone] Office  [phone] Pager [phone]  On-Call/Text Page:      [email or web address]      password [facility]

## 2013-07-04 NOTE — Progress Notes (Signed)
Utilization review completed. Ijanae Macapagal, RN, BSN. 

## 2013-07-04 NOTE — Procedures (Signed)
Supervised. Present through the entire procedure.  Doree Fudge, MD Pulmonary and [place] Pager: [phone]

## 2013-07-04 NOTE — Progress Notes (Signed)
Patient's husband tonight stated his wife's code status to be changed from full code to DNR/DNI after discussion with his wife and family.   [place] PGY-1 IMTS

## 2013-07-04 NOTE — Consult Note (Addendum)
PULMONARY / CRITICAL CARE MEDICINE   Name: Monique Russo MRN: [id number] DOB: [birth year]    ADMISSION DATE:  07/03/2013 CONSULTATION DATE:  07/03/2013  REFERRING MD :  EDP PRIMARY SERVICE: TRH  CHIEF COMPLAINT:  Hypotension  BRIEF PATIENT DESCRIPTION: 80 year old female with history of chronic back pain on high doses of opioids for DJD presented to ED with altered mental status. In ED found to be hypotensive, persistent in spite of fluid resuscitation.  SIGNIFICANT EVENTS / STUDIES:  4/17  Doppler LE >>> No DVTs, L popliteal fossa.  LINES / TUBES: L IJ CVL 4/17 >>> L R AL 4/17 >>> 4/16  Foley >>>  CULTURES: 4/16  Blood  >>> 4/16  Urine>>> 4/16 Sputum  >>>  ANTIBIOTICS: Zosyn 4/16 >>> Vancomycin 4/16 >>>  INTERVAL HISTORY:  Hypotensive in spite of adequate fluid resuscitation ( 4 L ).  VITAL SIGNS: Temp:  [98 F (36.7 C)-101.6 F (38.7 C)] 98 F (36.7 C) (04/17 1213) Pulse Rate:  [58-90] 64 (04/17 1200) Resp:  [7-27] 17 (04/17 1200) BP: (65-148)/(27-96) 112/96 mmHg (04/17 1200) SpO2:  [90 %-100 %] 95 % (04/17 1200) Arterial Line BP: (79)/(33) 79/33 mmHg (04/17 1200) Weight:  [83.915 kg (185 lb)-87.3 kg (192 lb 7.4 oz)] 87.3 kg (192 lb 7.4 oz) (04/17 0100)  HEMODYNAMICS:   VENTILATOR SETTINGS:   INTAKE / OUTPUT: Intake/Output     04/16 0701 - 04/17 0700 04/17 0701 - 04/18 0700   P.O. 120    I.V. (mL/kg) 2598.3 (29.8)    IV Piggyback 550    Total Intake(mL/kg) 3268.3 (37.4)    Urine (mL/kg/hr) 1045    Total Output 1045     Net +2223.3           PHYSICAL EXAMINATION: General:  No acute distress Neuro:  Awake, alert HEENT:  Moist membranes Cardiovascular:  Regular, no murmurs Lungs: Diminished bilateral air entry, rales Abdomen:  Rotund, non-tender Musculoskeletal:  No acute deformity.  Skin:  BLE edema / erythema.   LABS:  CBC  Recent Labs Lab 07/03/13 1923 07/04/13 0825  WBC 24.9* 18.3*  HGB 11.1* 9.5*  HCT 31.9* 28.1*  PLT 301 257    Coag's No results found for this basename: APTT, INR,  in the last 168 hours BMET  Recent Labs Lab 07/03/13 1923 07/04/13 0825  NA 129* 135*  K 3.3* 3.0*  CL 81* 93*  CO2 36* 30  BUN 34* 23  CREATININE 1.03 0.84  GLUCOSE 122* 158*   Electrolytes  Recent Labs Lab 07/03/13 1923 07/04/13 0825  CALCIUM 10.1 8.6   Sepsis Markers  Recent Labs Lab 07/03/13 1944 07/04/13 0037 07/04/13 0155  LATICACIDVEN 1.06  --  2.2  PROCALCITON  --  0.40  --    ABG  Recent Labs Lab 07/03/13 2224  PHART 7.408  PCO2ART 57.9*  PO2ART 77.0*   Liver Enzymes  Recent Labs Lab 07/03/13 1923  AST 24  ALT 16  ALKPHOS 69  BILITOT 0.4  ALBUMIN 3.0*   Cardiac Enzymes  Recent Labs Lab 07/04/13 0036 07/04/13 0155 07/04/13 0825  TROPONINI  --  <0.30 <0.30  PROBNP 1646.0*  --   --    Glucose  Recent Labs Lab 07/04/13 0105  GLUCAP 115*   IMAGING:  Dg Chest Port 1 View  07/04/2013   CLINICAL DATA:  Followup effusion.  EXAM: PORTABLE CHEST - 1 VIEW  COMPARISON:  07/03/2013  FINDINGS: Heart is enlarged. There are central changes of mild pulmonary edema.  More focal passed developed at the left lung base consistent with infectious. Less likely the findings could be related to asymmetric edema. There is mild elevation right hemidiaphragm, stable in appearance. Left numerous surgery  IMPRESSION: 1. Persistent cardiomegaly and mild edema. 2. Developing left infiltrate   Electronically Signed   By: Shon Hale M.D.   On: 07/04/2013 08:13   Dg Chest Port 1 View  (if Code Sepsis Called)  07/03/2013   CLINICAL DATA:  Sepsis  EXAM: PORTABLE CHEST - 1 VIEW  COMPARISON:  DG CHEST 2 VIEW dated 04/13/2010; DG SHOULDER*L*PORT dated 04/19/2010  FINDINGS: The heart remains enlarged. Vascular congestion and diffuse interstitial edema are present. Right hemidiaphragm remains elevated. Spinal stimulator has been removed. Left total shoulder arthroplasty has been placed. It is stable compared  04/19/2010. No pneumothorax. Mild cardiomegaly.  IMPRESSION: CHF with interstitial edema and vascular congestion.   Electronically Signed   By: Maryclare Bean M.D.   On: 07/03/2013 20:12   ASSESSMENT / PLAN:  PULMONARY A:  Acute respiratory failure Pulmonary edema Less likely pneumonia P:   Goal SpO2>92 Supplemental oxygen Limit IVF No indications for intubation at this time  CARDIOVASCULAR A:  Sepsis vs hypotension related to opioid overdose, responded to Narcan Acute on chronic diastolic congestive heart failure P:  Goal MAP>65 Place CVL Place a-line Start Levophed gtt TTE Narcan gtt  RENAL A:   Appears euvolemic  P:   Goal CVP 8-10 Hold preadmission Lasix D/c maintenance IVF, may give additional fluid based on CVP Trend BMP  GASTROINTESTINAL A:   Nutrition GI Px P:   Protonix  HEMATOLOGIC A:  Leukocytosis VTE Px P:  Trend CBC Heparin  INFECTIOUS A:   Cellulitis? P:   Abx / cx as above  ENDOCRINE A: Suspected adrenal insufficiency P:   Check Cortisol level Start Hydrocortisone  NEUROLOGIC A:  Encephalopathy Chronic opioids use Likely opioids overdose P:   Narcan as above  I have personally obtained history, examined patient, evaluated and interpreted laboratory and imaging results, reviewed medical records, formulated assessment / plan and placed orders.  CRITICAL CARE:  The patient is critically ill with multiple organ systems failure and requires high complexity decision making for assessment and support, frequent evaluation and titration of therapies, application of advanced monitoring technologies and extensive interpretation of multiple databases. Critical Care Time devoted to patient care services described in this note is 35 minutes.   Doree Fudge, MD Pulmonary and [place] Pager: [phone]  07/04/2013, 12:59 PM

## 2013-07-04 NOTE — Procedures (Signed)
Arterial Catheter Insertion Procedure Note Monique Russo [id number] January 09, 1931  Procedure: Insertion of Arterial Catheter  Indications: Blood pressure monitoring  Procedure Details Consent: Risks of procedure as well as the alternatives and risks of each were explained to the (patient/caregiver).  Consent for procedure obtained. Time Out: Verified patient identification, verified procedure, site/side was marked, verified correct patient position, special equipment/implants available, medications/allergies/relevent history reviewed, required imaging and test results available.  Performed  Maximum sterile technique was used including antiseptics, cap, gloves, gown, hand hygiene, mask and sheet. Skin prep: Chlorhexidine; local anesthetic administered 20 gauge catheter was inserted into left radial artery using the Seldinger technique.  Evaluation Blood flow good; BP tracing good. Complications: No apparent complications. RT placed arrow catheter on second attempt.   Monique Russo [facility] 07/04/2013

## 2013-07-04 NOTE — Progress Notes (Signed)
[facility] Progress Note Patient Name: Monique Russo DOB: 11/17/1930 MRN: [id number]  Date of Service  07/04/2013   HPI/Events of Note  C/O of chronic constipation   eICU Interventions  PRN for constipation   Intervention Category Minor Interventions: Routine modifications to care plan (e.g. PRN medications for pain, fever)  Guadelupe Sabin Kena Limon 07/04/2013, 9:39 PM

## 2013-07-05 DIAGNOSIS — M549 Dorsalgia, unspecified: Secondary | ICD-10-CM

## 2013-07-05 DIAGNOSIS — T4271XA Poisoning by unspecified antiepileptic and sedative-hypnotic drugs, accidental (unintentional), initial encounter: Secondary | ICD-10-CM

## 2013-07-05 DIAGNOSIS — T426X4A Poisoning by other antiepileptic and sedative-hypnotic drugs, undetermined, initial encounter: Secondary | ICD-10-CM

## 2013-07-05 LAB — URINE CULTURE
COLONY COUNT: NO GROWTH
Culture: NO GROWTH
SPECIAL REQUESTS: NORMAL

## 2013-07-05 MED ORDER — DIPHENHYDRAMINE HCL 50 MG/ML IJ SOLN
25.0000 mg | Freq: Four times a day (QID) | INTRAMUSCULAR | Status: DC | PRN
  Administered 2013-07-05 – 2013-07-09 (×6): 25 mg via INTRAVENOUS
  Filled 2013-07-05 (×7): qty 1

## 2013-07-05 MED ORDER — FENTANYL CITRATE 0.05 MG/ML IJ SOLN
100.0000 ug | INTRAMUSCULAR | Status: DC | PRN
  Administered 2013-07-05 – 2013-07-07 (×9): 100 ug via INTRAVENOUS
  Filled 2013-07-05 (×9): qty 2

## 2013-07-05 MED ORDER — HALOPERIDOL LACTATE 5 MG/ML IJ SOLN
2.0000 mg | INTRAMUSCULAR | Status: DC
  Administered 2013-07-05 – 2013-07-09 (×24): 2 mg via INTRAVENOUS
  Filled 2013-07-05: qty 1
  Filled 2013-07-05: qty 0.4
  Filled 2013-07-05: qty 1
  Filled 2013-07-05: qty 0.4
  Filled 2013-07-05 (×2): qty 1
  Filled 2013-07-05: qty 0.4
  Filled 2013-07-05: qty 1
  Filled 2013-07-05 (×2): qty 0.4
  Filled 2013-07-05 (×2): qty 1
  Filled 2013-07-05 (×3): qty 0.4
  Filled 2013-07-05: qty 1
  Filled 2013-07-05 (×2): qty 0.4
  Filled 2013-07-05 (×2): qty 1
  Filled 2013-07-05: qty 0.4
  Filled 2013-07-05 (×2): qty 1
  Filled 2013-07-05 (×3): qty 0.4
  Filled 2013-07-05: qty 1
  Filled 2013-07-05 (×3): qty 0.4
  Filled 2013-07-05: qty 1
  Filled 2013-07-05 (×4): qty 0.4
  Filled 2013-07-05: qty 1
  Filled 2013-07-05 (×2): qty 0.4
  Filled 2013-07-05: qty 1
  Filled 2013-07-05: qty 0.4
  Filled 2013-07-05: qty 1

## 2013-07-05 MED ORDER — DIPHENHYDRAMINE HCL 25 MG PO CAPS
25.0000 mg | ORAL_CAPSULE | Freq: Once | ORAL | Status: AC
  Administered 2013-07-05: 25 mg via ORAL
  Filled 2013-07-05: qty 1

## 2013-07-05 MED ORDER — WHITE PETROLATUM GEL
Status: AC
  Administered 2013-07-05: 15:00:00
  Filled 2013-07-05: qty 5

## 2013-07-05 MED ORDER — TRAZODONE HCL 50 MG PO TABS
50.0000 mg | ORAL_TABLET | Freq: Every evening | ORAL | Status: DC | PRN
  Administered 2013-07-05: 50 mg via ORAL
  Filled 2013-07-05: qty 1

## 2013-07-05 MED ORDER — FENTANYL CITRATE 0.05 MG/ML IJ SOLN
50.0000 ug | INTRAMUSCULAR | Status: DC | PRN
  Administered 2013-07-05 (×5): 50 ug via INTRAVENOUS
  Filled 2013-07-05 (×6): qty 2

## 2013-07-05 MED ORDER — ADULT MULTIVITAMIN W/MINERALS CH
1.0000 | ORAL_TABLET | Freq: Every day | ORAL | Status: DC
  Administered 2013-07-05 – 2013-07-07 (×2): 1 via ORAL
  Filled 2013-07-05 (×3): qty 1

## 2013-07-05 MED ORDER — FOLIC ACID 1 MG PO TABS
1.0000 mg | ORAL_TABLET | Freq: Every day | ORAL | Status: DC
  Administered 2013-07-05 – 2013-07-07 (×2): 1 mg via ORAL
  Filled 2013-07-05 (×3): qty 1

## 2013-07-05 MED ORDER — VITAMIN B-1 100 MG PO TABS
100.0000 mg | ORAL_TABLET | Freq: Every day | ORAL | Status: DC
  Administered 2013-07-05 – 2013-07-10 (×5): 100 mg via ORAL
  Filled 2013-07-05 (×6): qty 1

## 2013-07-05 MED ORDER — METHOCARBAMOL 750 MG PO TABS
750.0000 mg | ORAL_TABLET | Freq: Once | ORAL | Status: AC
  Administered 2013-07-05: 750 mg via ORAL
  Filled 2013-07-05: qty 1

## 2013-07-05 MED ORDER — LORAZEPAM 2 MG/ML IJ SOLN
1.0000 mg | Freq: Once | INTRAMUSCULAR | Status: AC
  Administered 2013-07-05: 1 mg via INTRAVENOUS

## 2013-07-05 MED ORDER — DIPHENHYDRAMINE HCL 50 MG PO CAPS: 50.0000 mg | ORAL_CAPSULE | Freq: Once | ORAL | Status: DC

## 2013-07-05 MED ORDER — POTASSIUM CHLORIDE CRYS ER 20 MEQ PO TBCR
40.0000 meq | EXTENDED_RELEASE_TABLET | ORAL | Status: AC
  Administered 2013-07-05 (×2): 40 meq via ORAL
  Filled 2013-07-05 (×2): qty 2

## 2013-07-05 MED ORDER — FENTANYL 50 MCG/HR TD PT72
50.0000 ug | MEDICATED_PATCH | TRANSDERMAL | Status: DC
  Administered 2013-07-05 – 2013-07-08 (×2): 50 ug via TRANSDERMAL
  Filled 2013-07-05 (×2): qty 1

## 2013-07-05 MED ORDER — LORAZEPAM 2 MG/ML IJ SOLN
INTRAMUSCULAR | Status: AC
  Filled 2013-07-05: qty 1

## 2013-07-05 MED ORDER — MIDAZOLAM HCL 2 MG/2ML IJ SOLN
0.5000 mg | INTRAMUSCULAR | Status: DC | PRN
  Administered 2013-07-05 – 2013-07-07 (×10): 0.5 mg via INTRAVENOUS
  Filled 2013-07-05 (×10): qty 2

## 2013-07-05 NOTE — Consult Note (Addendum)
PULMONARY / CRITICAL CARE MEDICINE   Name: Monique Russo MRN: [id number] DOB: 02/16/1931    ADMISSION DATE:  07/03/2013 CONSULTATION DATE:  07/03/2013  REFERRING MD :  EDP PRIMARY SERVICE: TRH  CHIEF COMPLAINT:  Hypotension  BRIEF PATIENT DESCRIPTION: 80 year old female with history of chronic back pain on high doses of opioids for DJD presented to ED with altered mental status. In ED found to be hypotensive, persistent in spite of fluid resuscitation.  SIGNIFICANT EVENTS / STUDIES:  4/17  Doppler LE >>> No DVTs, L popliteal fossa. 4/18  Precedex required  LINES / TUBES: L IJ CVL 4/17 >>> L R AL 4/17 >>>4/18 4/16  Foley >>>  CULTURES: 4/16  Blood  >>> 4/16  Urine>>> 4/16 Sputum  >>>  ANTIBIOTICS: Zosyn 4/16 >>>4/18 Vancomycin 4/16 >>>4/18  INTERVAL HISTORY:  Bp good, precedex, emotional  VITAL SIGNS: Temp:  [97.8 F (36.6 C)-99.1 F (37.3 C)] 98.1 F (36.7 C) (04/18 0730) Pulse Rate:  [38-89] 65 (04/18 0900) Resp:  [9-29] 14 (04/18 0900) BP: (71-142)/(37-96) 142/65 mmHg (04/18 0900) SpO2:  [82 %-100 %] 97 % (04/18 0900) Arterial Line BP: (69-144)/(17-76) 69/62 mmHg (04/18 0900)  HEMODYNAMICS:   VENTILATOR SETTINGS:   INTAKE / OUTPUT: Intake/Output     04/17 0701 - 04/18 0700 04/18 0701 - 04/19 0700   P.O.     I.V. (mL/kg) 1046.1 (12)    IV Piggyback 900    Total Intake(mL/kg) 1946.1 (22.3)    Urine (mL/kg/hr) 1850 (0.9)    Total Output 1850     Net +96.1           PHYSICAL EXAMINATION: General:  No acute distress Neuro:  Awake, alert, screaming a little HEENT:  Moist membranes Cardiovascular:  s1 s2 Regular, no murmurs Lungs: CTa, reduced Abdomen:  Rotund, non-tender Musculoskeletal:  No acute deformity.  Skin:  BLE edema / erythema.   LABS:  CBC  Recent Labs Lab 07/03/13 1923 07/04/13 0825  WBC 24.9* 18.3*  HGB 11.1* 9.5*  HCT 31.9* 28.1*  PLT 301 257   Coag's No results found for this basename: APTT, INR,  in the last 168  hours BMET  Recent Labs Lab 07/03/13 1923 07/04/13 0825  NA 129* 135*  K 3.3* 3.0*  CL 81* 93*  CO2 36* 30  BUN 34* 23  CREATININE 1.03 0.84  GLUCOSE 122* 158*   Electrolytes  Recent Labs Lab 07/03/13 1923 07/04/13 0825  CALCIUM 10.1 8.6   Sepsis Markers  Recent Labs Lab 07/03/13 1944 07/04/13 0037 07/04/13 0155  LATICACIDVEN 1.06  --  2.2  PROCALCITON  --  0.40  --    ABG  Recent Labs Lab 07/03/13 2224  PHART 7.408  PCO2ART 57.9*  PO2ART 77.0*   Liver Enzymes  Recent Labs Lab 07/03/13 1923  AST 24  ALT 16  ALKPHOS 69  BILITOT 0.4  ALBUMIN 3.0*   Cardiac Enzymes  Recent Labs Lab 07/04/13 0036 07/04/13 0155 07/04/13 0825 07/04/13 1304  TROPONINI  --  <0.30 <0.30 <0.30  PROBNP 1646.0*  --   --   --    Glucose  Recent Labs Lab 07/04/13 0105  GLUCAP 115*   IMAGING:  Dg Chest Port 1 View  07/04/2013   CLINICAL DATA:  Central line placement  EXAM: PORTABLE CHEST - 1 VIEW  COMPARISON:  DG CHEST 1V PORT dated 07/04/2013  FINDINGS: Left IJ central line tip is partly obscured by overlying cardiac leads. The tip appears to be located  at least the level of the brachiocephalic/SVC junction. Patient is rotated to the right. Left shoulder arthroplasty remains in place. Cardiomegaly with unfolding of the aorta is reidentified. Patchy bibasilar airspace opacities are again identified. Right glenohumeral joint degenerative change noted. No pneumothorax.  IMPRESSION: Poor visualization of the tip of the left IJ approach central line, at least at the level of the brachiocephalic/ SV junction but partly obscured by Cardiac leads.  Persistent bibasilar patchy airspace opacities.   Electronically Signed   By: Conchita Paris M.D.   On: 07/04/2013 12:48   Dg Chest Port 1 View  07/04/2013   CLINICAL DATA:  Followup effusion.  EXAM: PORTABLE CHEST - 1 VIEW  COMPARISON:  07/03/2013  FINDINGS: Heart is enlarged. There are central changes of mild pulmonary edema. More  focal passed developed at the left lung base consistent with infectious. Less likely the findings could be related to asymmetric edema. There is mild elevation right hemidiaphragm, stable in appearance. Left numerous surgery  IMPRESSION: 1. Persistent cardiomegaly and mild edema. 2. Developing left infiltrate   Electronically Signed   By: Shon Hale M.D.   On: 07/04/2013 08:13   Dg Chest Port 1 View  (if Code Sepsis Called)  07/03/2013   CLINICAL DATA:  Sepsis  EXAM: PORTABLE CHEST - 1 VIEW  COMPARISON:  DG CHEST 2 VIEW dated 04/13/2010; DG SHOULDER*L*PORT dated 04/19/2010  FINDINGS: The heart remains enlarged. Vascular congestion and diffuse interstitial edema are present. Right hemidiaphragm remains elevated. Spinal stimulator has been removed. Left total shoulder arthroplasty has been placed. It is stable compared 04/19/2010. No pneumothorax. Mild cardiomegaly.  IMPRESSION: CHF with interstitial edema and vascular congestion.   Electronically Signed   By: Maryclare Bean M.D.   On: 07/03/2013 20:12   ASSESSMENT / PLAN:  PULMONARY A:  Acute respiratory failure Pulmonary edema No evidence pneumonia P:   Goal SpO2>92 Supplemental oxygen IS if able  CARDIOVASCULAR A:  Sepsis vs hypotension related to opioid overdose, responded to Narcan chronic diastolic congestive heart failure P:  Goal MAP>65, ok on own CVL, no cvp needed Dc a-line TTE Narcan gtt off, keep off  RENAL A:   Appears euvolemic  hypokalemia P:   Replace K  Oral kvo Allow pos balance, no chf  GASTROINTESTINAL A:   Nutrition GI Px P:   Protonix Diet ok  HEMATOLOGIC A:  Leukocytosis VTE Px P:  Trend CBC in with pos balance Heparin  INFECTIOUS A:   Cellulitis not noted P:   Dc abx BP rapid resolution observe  ENDOCRINE A: Suspected adrenal insufficiency P:   Continue  Hydrocortisone  NEUROLOGIC A:  Encephalopathy Chronic opioids use Likely opioids overdose angry P:   Narcan off  Dc  precedex, ineffective  Haldol scheduled, qtc Oral Risperdal consideration Increase fent, may need furtehr escallation likely needs psych  I have personally obtained history, examined patient, evaluated and interpreted laboratory and imaging results, reviewed medical records, formulated assessment / plan and placed orders.  CRITICAL CARE:  The patient is critically ill with multiple organ systems failure and requires high complexity decision making for assessment and support, frequent evaluation and titration of therapies, application of advanced monitoring technologies and extensive interpretation of multiple databases. Critical Care Time devoted to patient care services described in this note is 30 minutes.   Raylene Miyamoto, MD Pulmonary and [place] Pager: [phone]  07/05/2013, 10:07 AM

## 2013-07-05 NOTE — Progress Notes (Signed)
Monique Russo has been profoundly difficult to care for today. Per Dr. Janit Russo order, we d/c'd the Precedex and have started Fentanyl Dermal 50 mcg patch. Additionally we have been giving fentanyl 50 mcg q 1 hour IV, Benadryl 25 mg IV q 6 hours, versed 0.5 mg q 4 hours, Haldol 2 mg Iv q 4 hours( QTc is < 0.5).Despite this, the patient continues to be aggitated, verbally abusive to the staff and her family, flailing in the bed and screaming out to the point she is upsetting and disturbing other patients and family members. You cannot reason with her at this point. To compound the difficulty of this situation, there is a tremendous amount of dysfunction within the family. The patient's husband has asked to meet with social work and case management regarding post hospitalization arrangements.He does not want the daughters involved as they will be " mad at him" regarding this, but he states he cannot care for the patient alone if she cannot walk to the bathroom. Both consults were placed. Additionally, the daughter Monique Russo seems to enable her mother's drug seeking, asking why we just couldn't give her 200 mg of morphine if the 300 she normally takes is too much. Dr. Titus Russo spent time with the sisters this morning explaining his plan to wean the patient off the morphine, and give the above drugs while slowly up-titrating them to meet her needs, but prevent the overdose/ cardiovascular and respiratory compromise that brought the patient into the hospital. They understood and agreed with the plan. Monique Russo,( daughter) who was not here at the time of the doctors visit, felt that her mom needed some of the morphine back, and as a result is very unhappy with the plan, and was likewise very angry and disrespectful to the staff. She refused to leave the room so we could de-escalate the agitated state she had worked her mother into.( They seem to feed off each others agitation) Per the patient's son, there are some of the family  members who actually give the patient additional medications when she needs them. This has been going on for about 15 years per the son. The family is very segregated regarding this patient and as a result there is a great deal of discord among them which has just complicated this patients care. Monique Russo spoke with Monique Russo complaining about how the patient has been managed. Monique Russo called [facility] asking the CCM group to please speak with the family when they could, but they will find the family is very fragmented in their expectations. This patient has been extremely demanding today, and both she and the daughters do not seem to understand that we as nurses can only give the medications ordered by a physician.Despite a great deal of pain medication the patient continues to maintain an agitated state.( of note, the patient also drinks about 3/4-1 bottle of wine/day.)She was transferred to 2 C 01 per order.I did obtain an order for ativan prior to transfer per Dr. Titus Russo.  She was hemodynamically stable, and respirations were regular and unlabored.The patient has not been tachycardic or had fever as we usually see with withdrawal. Daughter Monique Russo transferred with her and refused to leave the room on 2600 when asked. The patient has had many bowel movements today. She has been on and off the bedpan, and was incontinent x 1 only. The family state that she is usually very constipated due to her pain medications.

## 2013-07-05 NOTE — Progress Notes (Signed)
[facility] Progress Note Patient Name: Monique Russo DOB: [birth year] MRN: [id number]  Date of Service  07/05/2013   HPI/Events of Note   Pain now well controlled. Demanding Restoril.   eICU Interventions   Already getting Versed. Will give more fentanyl and Trazodone for sleep.    Intervention Category Intermediate Interventions: Pain - evaluation and management  Mariea Clonts 07/05/2013, 10:21 PM

## 2013-07-06 LAB — COMPREHENSIVE METABOLIC PANEL
ALT: 25 U/L (ref 0–35)
AST: 32 U/L (ref 0–37)
Albumin: 2.6 g/dL — ABNORMAL LOW (ref 3.5–5.2)
Alkaline Phosphatase: 141 U/L — ABNORMAL HIGH (ref 39–117)
BUN: 14 mg/dL (ref 6–23)
CALCIUM: 9.5 mg/dL (ref 8.4–10.5)
CO2: 25 meq/L (ref 19–32)
CREATININE: 0.68 mg/dL (ref 0.50–1.10)
Chloride: 99 mEq/L (ref 96–112)
GFR calc non Af Amer: 80 mL/min — ABNORMAL LOW (ref >90)
GLUCOSE: 105 mg/dL — AB (ref 70–99)
Potassium: 3 mEq/L — ABNORMAL LOW (ref 3.7–5.3)
Sodium: 140 mEq/L (ref 137–147)
TOTAL PROTEIN: 6.6 g/dL (ref 6.0–8.3)
Total Bilirubin: 0.4 mg/dL (ref 0.3–1.2)

## 2013-07-06 LAB — CBC WITH DIFFERENTIAL/PLATELET
BASOS PCT: 0 % (ref 0–1)
Basophils Absolute: 0 10*3/uL (ref 0.0–0.1)
Eosinophils Absolute: 0 10*3/uL (ref 0.0–0.7)
Eosinophils Relative: 0 % (ref 0–5)
HCT: 33 % — ABNORMAL LOW (ref 36.0–46.0)
HEMOGLOBIN: 11.1 g/dL — AB (ref 12.0–15.0)
LYMPHS ABS: 2.1 10*3/uL (ref 0.7–4.0)
Lymphocytes Relative: 11 % — ABNORMAL LOW (ref 12–46)
MCH: 32.8 pg (ref 26.0–34.0)
MCHC: 33.6 g/dL (ref 30.0–36.0)
MCV: 97.6 fL (ref 78.0–100.0)
MONOS PCT: 8 % (ref 3–12)
Monocytes Absolute: 1.5 10*3/uL — ABNORMAL HIGH (ref 0.1–1.0)
NEUTROS ABS: 15.8 10*3/uL — AB (ref 1.7–7.7)
Neutrophils Relative %: 81 % — ABNORMAL HIGH (ref 43–77)
Platelets: 361 10*3/uL (ref 150–400)
RBC: 3.38 MIL/uL — AB (ref 3.87–5.11)
RDW: 13.6 % (ref 11.5–15.5)
WBC: 19.4 10*3/uL — ABNORMAL HIGH (ref 4.0–10.5)

## 2013-07-06 LAB — PROCALCITONIN: Procalcitonin: 0.22 ng/mL

## 2013-07-06 MED ORDER — POTASSIUM CHLORIDE 10 MEQ/50ML IV SOLN
10.0000 meq | INTRAVENOUS | Status: AC
  Administered 2013-07-06 (×5): 10 meq via INTRAVENOUS
  Filled 2013-07-06 (×5): qty 50

## 2013-07-06 MED ORDER — DIPHENHYDRAMINE HCL 50 MG/ML IJ SOLN
25.0000 mg | Freq: Once | INTRAMUSCULAR | Status: AC
  Administered 2013-07-06: 25 mg via INTRAVENOUS
  Filled 2013-07-06: qty 1

## 2013-07-06 MED ORDER — POTASSIUM CHLORIDE CRYS ER 20 MEQ PO TBCR: 40.0000 meq | EXTENDED_RELEASE_TABLET | ORAL | Status: DC

## 2013-07-06 NOTE — Progress Notes (Addendum)
Patient IP:Monique Russo      DOB: [birth year]      NLZ:[id number]  Left message for patient's spouse to call early today.  No response and so I returned to see if he had arrived.  Mr. Scheper was present and stated that he is having trouble reconciling his family to reasonable goals of care.  He states he has two sons who are out of town that want to be present and two daughters who are in town.  His Sister In [name] was present and was told by the spouse to stand down that she had no say in the matter.  She keeps requesting just to put the "drip up". The patient's spouse would like to call with a time tomorrow to meet before moving forward with comfort care as his family is telling him that if he elects full comfort that this is like "killing her".  He has also spoken with their former pastor about the ethics of considering comfort care. Patient has a history of bipolar disorder, could try depakote 250 mg BID and titrate if needed to try to mediate mood stabilization.    Updated Dr. Earnest Conroy in the [place] office.  Marsia Cino L. Lovena Le, MD MBA The Palliative Medicine Team at [facility] Phone: [phone] Pager: [phone]

## 2013-07-06 NOTE — Progress Notes (Signed)
PULMONARY / CRITICAL CARE MEDICINE   Name: Monique Russo MRN: [id number] DOB: [birth year]    ADMISSION DATE:  07/03/2013 CONSULTATION DATE:  07/03/2013  REFERRING MD :  EDP PRIMARY SERVICE: TRH  CHIEF COMPLAINT:  Hypotension  BRIEF PATIENT DESCRIPTION: 80 year old female with history of chronic back pain on high doses of opioids for DJD presented to ED with altered mental status. In ED found to be hypotensive, persistent in spite of fluid resuscitation.  SIGNIFICANT EVENTS / STUDIES:  4/17  Doppler LE >>> No DVTs, L popliteal fossa. 4/18  Precedex required  LINES / TUBES: L IJ CVL 4/17 >>> L R AL 4/17 >>>4/18 4/16  Foley >>>  CULTURES: 4/16  Blood  >>> 4/16  Urine>>> 4/16 Sputum  >>>  ANTIBIOTICS: Zosyn 4/16 >>>4/18 Vancomycin 4/16 >>>4/18  INTERVAL HISTORY:  Extremely agitated yesterday and into night- see staff notes. Now asleep, daughters in room wanted to show me video but could not operate IPad.  VITAL SIGNS: Temp:  [97.5 F (36.4 C)-99.5 F (37.5 C)] 99.5 F (37.5 C) (04/19 0734) Pulse Rate:  [57-96] 88 (04/19 0350) Resp:  [18-52] 18 (04/19 0350) BP: (100-152)/(62-110) 136/84 mmHg (04/19 0350) SpO2:  [93 %-98 %] 95 % (04/19 0350) Arterial Line BP: (161-171)/(80-89) 171/89 mmHg (04/18 1500)  HEMODYNAMICS:   VENTILATOR SETTINGS:   INTAKE / OUTPUT: Intake/Output     04/18 0701 - 04/19 0700 04/19 0701 - 04/20 0700   P.O.  260   I.V. (mL/kg) 220 (2.5)    IV Piggyback     Total Intake(mL/kg) 220 (2.5) 260 (3)   Urine (mL/kg/hr) 1750 (0.8)    Total Output 1750     Net -1530 +260         PHYSICAL EXAMINATION: General:  No acute distress Neuro:  Sleeping soundly- I chose not to wake her- daughters agreed HEENT:  Atraumatic Cardiovascular:  RSR monitor Lungs: Unlabored w/o audible wheeze Abdomen:  Rotund,  Musculoskeletal:  No obvious deformity.  Skin:  BLE edema / erythema.   LABS:  CBC  Recent Labs Lab 07/03/13 1923 07/04/13 0825  07/06/13 0415  WBC 24.9* 18.3* 19.4*  HGB 11.1* 9.5* 11.1*  HCT 31.9* 28.1* 33.0*  PLT 301 257 361   Coag's No results found for this basename: APTT, INR,  in the last 168 hours BMET  Recent Labs Lab 07/03/13 1923 07/04/13 0825 07/06/13 0415  NA 129* 135* 140  K 3.3* 3.0* 3.0*  CL 81* 93* 99  CO2 36* 30 25  BUN 34* 23 14  CREATININE 1.03 0.84 0.68  GLUCOSE 122* 158* 105*   Electrolytes  Recent Labs Lab 07/03/13 1923 07/04/13 0825 07/06/13 0415  CALCIUM 10.1 8.6 9.5   Sepsis Markers  Recent Labs Lab 07/03/13 1944 07/04/13 0037 07/04/13 0155 07/06/13 0415  LATICACIDVEN 1.06  --  2.2  --   PROCALCITON  --  0.40  --  0.22   ABG  Recent Labs Lab 07/03/13 2224  PHART 7.408  PCO2ART 57.9*  PO2ART 77.0*   Liver Enzymes  Recent Labs Lab 07/03/13 1923 07/06/13 0415  AST 24 32  ALT 16 25  ALKPHOS 69 141*  BILITOT 0.4 0.4  ALBUMIN 3.0* 2.6*   Cardiac Enzymes  Recent Labs Lab 07/04/13 0036 07/04/13 0155 07/04/13 0825 07/04/13 1304  TROPONINI  --  <0.30 <0.30 <0.30  PROBNP 1646.0*  --   --   --    Glucose  Recent Labs Lab 07/04/13 0105  GLUCAP 115*  IMAGING:  Dg Chest Port 1 View  07/04/2013   CLINICAL DATA:  Central line placement  EXAM: PORTABLE CHEST - 1 VIEW  COMPARISON:  DG CHEST 1V PORT dated 07/04/2013  FINDINGS: Left IJ central line tip is partly obscured by overlying cardiac leads. The tip appears to be located at least the level of the brachiocephalic/SVC junction. Patient is rotated to the right. Left shoulder arthroplasty remains in place. Cardiomegaly with unfolding of the aorta is reidentified. Patchy bibasilar airspace opacities are again identified. Right glenohumeral joint degenerative change noted. No pneumothorax.  IMPRESSION: Poor visualization of the tip of the left IJ approach central line, at least at the level of the brachiocephalic/ SV junction but partly obscured by Cardiac leads.  Persistent bibasilar patchy airspace  opacities.   Electronically Signed   By: Conchita Paris M.D.   On: 07/04/2013 12:48   ASSESSMENT / PLAN:  PULMONARY A:  Acute respiratory failure Pulmonary edema No evidence pneumonia P:   Goal SpO2>92 Supplemental oxygen IS if able  CARDIOVASCULAR A:  Sepsis vs hypotension related to opioid overdose, responded to Narcan chronic diastolic congestive heart failure P:  Goal MAP>65, ok on own CVL, no cvp needed Dc a-line TTE Narcan gtt off, keep off  RENAL A:   Appears euvolemic  hypokalemia P:   Replace K  Oral> Started IV load 4/19 kvo Allow pos balance, no chf  GASTROINTESTINAL A:   Nutrition GI Px P:   Protonix Diet ok  HEMATOLOGIC A:  Leukocytosis VTE Px P:  Trend CBC in with pos balance Heparin  INFECTIOUS A:   Cellulitis not noted P:   Dc abx BP rapid resolution observe  ENDOCRINE A: Suspected adrenal insufficiency P:   Continue  Hydrocortisone  NEUROLOGIC A:  Encephalopathy Chronic opioids use Likely opioids overdose Angry Palliative Care is stepping in now. Patient has been narcotic dependent for years. Do not expect this to change. Need to avoid sharp swings of dosing strategy. P:   Narcan off  Dc precedex, ineffective  Haldol scheduled, qtc Oral Risperdal consideration Increase fent, may need furtehr escallation likely needs psych to include family counseling. Consider Requip for restless legs- family dx.   Deneise Lever, MD Pulmonary and [place] Pager: [phone]  [phone]  If no answer: [phone]  07/06/2013, 10:20 AM

## 2013-07-06 NOTE — Progress Notes (Signed)
[facility] Progress Note Patient Name: Monique Russo DOB: 11-18-1930 MRN: [id number]  Date of Service  07/06/2013   HPI/Events of Note  Hypokalemia   eICU Interventions  Potassium replaced   Intervention Category Minor Interventions: Electrolytes abnormality - evaluation and management  Guadelupe Sabin Arloa Prak 07/06/2013, 5:06 AM

## 2013-07-06 NOTE — Progress Notes (Signed)
Paged Dr Lowry Ram and requested that he camera in on Mrs Chern. Pt has had little relief of numerous c/o generalized pain . She is receiving Fentanyl 52mcgs q1hr for pain Benadryl  25mg  IV for restless legs Versed for aggitation  Q4hrs. With little or no relief. New orders received.

## 2013-07-06 NOTE — Progress Notes (Signed)
Spoke to Dr. Titus Mould regarding pt's family request for Palliative care. Informed that pt's daughters were distraught over the course of events regarding her care since admission. They are disatisfied with the medicine regimen she is receiving.

## 2013-07-06 NOTE — Progress Notes (Signed)
Patient very agitated and restless complaining of generalized pain, medicated per MAR with Fentanyl 69mcgs IV. Family at bedside refuses po meds at this time. Educated family and patient regarding the type of po medications scheduled, family and patient both refuse to take any medications that are not administered IV.

## 2013-07-07 DIAGNOSIS — Z515 Encounter for palliative care: Secondary | ICD-10-CM

## 2013-07-07 DIAGNOSIS — Z66 Do not resuscitate: Secondary | ICD-10-CM | POA: Diagnosis present

## 2013-07-07 DIAGNOSIS — F1120 Opioid dependence, uncomplicated: Secondary | ICD-10-CM

## 2013-07-07 DIAGNOSIS — IMO0002 Reserved for concepts with insufficient information to code with codable children: Secondary | ICD-10-CM

## 2013-07-07 DIAGNOSIS — R451 Restlessness and agitation: Secondary | ICD-10-CM | POA: Diagnosis present

## 2013-07-07 LAB — CBC WITH DIFFERENTIAL/PLATELET
Basophils Absolute: 0 10*3/uL (ref 0.0–0.1)
Basophils Relative: 0 % (ref 0–1)
Eosinophils Absolute: 0 10*3/uL (ref 0.0–0.7)
Eosinophils Relative: 0 % (ref 0–5)
HEMATOCRIT: 35.3 % — AB (ref 36.0–46.0)
HEMOGLOBIN: 12 g/dL (ref 12.0–15.0)
LYMPHS ABS: 1.5 10*3/uL (ref 0.7–4.0)
Lymphocytes Relative: 13 % (ref 12–46)
MCH: 32.8 pg (ref 26.0–34.0)
MCHC: 34 g/dL (ref 30.0–36.0)
MCV: 96.4 fL (ref 78.0–100.0)
MONO ABS: 1.1 10*3/uL — AB (ref 0.1–1.0)
Monocytes Relative: 10 % (ref 3–12)
Neutro Abs: 8.8 10*3/uL — ABNORMAL HIGH (ref 1.7–7.7)
Neutrophils Relative %: 77 % (ref 43–77)
Platelets: 342 10*3/uL (ref 150–400)
RBC: 3.66 MIL/uL — ABNORMAL LOW (ref 3.87–5.11)
RDW: 13.9 % (ref 11.5–15.5)
WBC: 11.4 10*3/uL — AB (ref 4.0–10.5)

## 2013-07-07 LAB — BASIC METABOLIC PANEL
BUN: 16 mg/dL (ref 6–23)
CHLORIDE: 101 meq/L (ref 96–112)
CO2: 27 mEq/L (ref 19–32)
Calcium: 9.3 mg/dL (ref 8.4–10.5)
Creatinine, Ser: 0.74 mg/dL (ref 0.50–1.10)
GFR calc non Af Amer: 78 mL/min — ABNORMAL LOW (ref >90)
Glucose, Bld: 135 mg/dL — ABNORMAL HIGH (ref 70–99)
POTASSIUM: 3.2 meq/L — AB (ref 3.7–5.3)
Sodium: 142 mEq/L (ref 137–147)

## 2013-07-07 MED ORDER — QUETIAPINE FUMARATE 25 MG PO TABS
25.0000 mg | ORAL_TABLET | Freq: Two times a day (BID) | ORAL | Status: DC
  Administered 2013-07-07 – 2013-07-10 (×6): 25 mg via ORAL
  Filled 2013-07-07 (×8): qty 1

## 2013-07-07 MED ORDER — POTASSIUM CHLORIDE CRYS ER 20 MEQ PO TBCR
40.0000 meq | EXTENDED_RELEASE_TABLET | Freq: Once | ORAL | Status: AC
  Administered 2013-07-07: 40 meq via ORAL
  Filled 2013-07-07: qty 2

## 2013-07-07 MED ORDER — MORPHINE SULFATE 15 MG PO TABS: 15.0000 mg | ORAL_TABLET | ORAL | Status: DC | PRN

## 2013-07-07 MED ORDER — HYDROCORTISONE SOD SUCCINATE 100 MG IJ SOLR
50.0000 mg | Freq: Two times a day (BID) | INTRAMUSCULAR | Status: DC
  Filled 2013-07-07 (×2): qty 1

## 2013-07-07 MED ORDER — VALPROATE SODIUM 500 MG/5ML IV SOLN
250.0000 mg | Freq: Four times a day (QID) | INTRAVENOUS | Status: DC
  Filled 2013-07-07 (×2): qty 2.5

## 2013-07-07 MED ORDER — DEXTROSE 5 % IV SOLN
250.0000 mg | Freq: Four times a day (QID) | INTRAVENOUS | Status: DC
  Administered 2013-07-08 (×3): 250 mg via INTRAVENOUS
  Filled 2013-07-07 (×4): qty 2.5

## 2013-07-07 MED ORDER — LORAZEPAM 1 MG PO TABS
1.0000 mg | ORAL_TABLET | ORAL | Status: DC | PRN
  Administered 2013-07-07 – 2013-07-08 (×2): 1 mg via ORAL
  Filled 2013-07-07: qty 1
  Filled 2013-07-07 (×2): qty 2

## 2013-07-07 MED ORDER — MORPHINE SULFATE 15 MG PO TABS
15.0000 mg | ORAL_TABLET | ORAL | Status: DC | PRN
  Administered 2013-07-07 – 2013-07-10 (×5): 15 mg via ORAL
  Filled 2013-07-07 (×5): qty 1

## 2013-07-07 NOTE — Consult Note (Signed)
Patient WU:JWJXB Haizley Cannella      DOB: [birth year]      JYN:[id number]     Consult Note from the Palliative Medicine Team at [facility] Requested by: Dr Gar Gibbon     PCP: No primary provider on file. Reason for Consultation:  Clarification of GOC and optiopns     Phone Number:None  Assessment of patients Current state: 80 year old female with history of chronic back pain on high doses of opioids for DJD presented to ED with altered mental status. In ED found to be hypotensive, persistent in spite of fluid resuscitation.  Consult is for review of medical treatment options, clarification of goals of care and end of life issues, disposition and options, and symptom recommendation.  This NP Wadie Lessen reviewed medical records, received report from team, assessed the patient and then meet at the patient's bedside along with her husband and four children  to discuss diagnosis prognosis, GOC, EOL wishes disposition and options.  A detailed discussion was had today regarding advanced directives.  Concepts specific to code status, artifical feeding and hydration, continued IV antibiotics and rehospitalization was had.  The difference between a aggressive medical intervention path  and a palliative comfort care path for this patient at this time was had.  Values and goals of care important to patient and family were attempted to be elicited.  Concept of Hospice and Palliative Care were discussed  Natural trajectory and expectations at EOL were discussed.  Questions and concerns addressed.  Hard Choices booklet left for review. Family encouraged to call with questions or concerns.  PMT will continue to support holistically.   Goals of Care: 1.  Code Status:DNR/DNI-comfort is main focus of care   2. Scope of Treatment: 1. Vital Signs: per shift 2. Respiratory/Oxygen:  For comfort only 3. Nutritional Support/Tube Feeds: no artifical feeding now or in the future 4. Antibiotics:  determine use of antibiotics if/when infection occurs and by mouth only 5. Blood Products: none 6. IVF: Kvo for medications only 7. Review of Medications to be discontinued: minimize for comofrt 8. Labs: none 9. Telemetry:none 10. Consults:none   3. Disposition:  Monitor over the next 24 hrs, family is open to SNF for rehabilitation if patient stabilizes and if her pain is controlled   4. Symptom Management:     Utilize ORAL agents first, in attempt to prepare for discharge.    This patient has been treated with high doses of Opiods for 15 yrs presently at [facility] [place].  She also has an underlying psych component (family unsure) anxiety  Vs bi-polar.  The main concern here is for comfort and quality.  Records requested from CPS today!   1. Anxiety/Agitation: Ativan 1 mg po every 4 hrs prn                                       Seroquel 25 mg po bid  2. Pain:  Continue Fentanyl patch                  Morphine 15 mg every 4 hrs prn   3. Bowel Regimen: Dulcolax supp prn   5. Psychosocial:  Emotional support offered to patient and family.  Her continued decline now makes it impossible for her husband to care for her at home   Patient Documents Completed or Given: Document Given Completed  Advanced Directives  Pkt    MOST yes   DNR    Gone from [facility]    Hard Choices      Brief HPI: 80 year old female with history of chronic back pain on high doses of opioids for DJD presented to ED with altered mental status. In ED found to be hypotensive, persistent in spite of fluid resuscitation. Confusing history family tells me she was given Narcan in an attempted to detox her.  She has been on high does opiods for 15 yrs for pain control.  Past psych history mentioned, but unclear.   ROS: denies pain or discomfort   PMH:  Past Medical History  Diagnosis Date  . Arthritis      PSH: Past Surgical History  Procedure Laterality Date  . Abdominal hysterectomy    . Back  surgery    . Joint replacement    . Colon surgery    . Hernia repair     I have reviewed the FH and SH and  If appropriate update it with new information. No Known Allergies Scheduled Meds: . antiseptic oral rinse  15 mL Mouth Rinse BID  . fentaNYL  50 mcg Transdermal Q72H  . folic acid  1 mg Oral Daily  . haloperidol lactate  2 mg Intravenous Q4H  . heparin  5,000 Units Subcutaneous 3 times per day  . hydrocortisone sodium succinate  50 mg Intravenous Q12H  . multivitamin with minerals  1 tablet Oral Daily  . senna-docusate  1 tablet Oral BID  . sodium chloride  500 mL Intravenous Once  . sodium chloride  3 mL Intravenous Q12H  . thiamine  100 mg Oral Daily  . timolol  1 drop Both Eyes Daily   Continuous Infusions: . sodium chloride 20 mL/hr at 07/04/13 2102   PRN Meds:.acetaminophen, bisacodyl, diphenhydrAMINE, fentaNYL, midazolam, traZODone    BP 139/78  Pulse 85  Temp(Src) 99.8 F (37.7 C) (Axillary)  Resp 17  Ht 5\' 2"  (1.575 m)  Wt 87.3 kg (192 lb 7.4 oz)  BMI 35.19 kg/m2  SpO2 91%   PPS:30 % at best   Intake/Output Summary (Last 24 hours) at 07/07/13 1521 Last data filed at 07/07/13 1130  Gross per 24 hour  Intake    598 ml  Output   1001 ml  Net   -403 ml     Physical Exam:  General: chronically ill appearing, NAD, denies discomfort presetnly HEENT:  Mm, no exudate Chest:   Decreased in bases CVS: RRR Abdomen:soft NT +BS Ext: without edema Neuro: oriented to person and place, poor insight into present medical situation  Labs: CBC    Component Value Date/Time   WBC 11.4* 07/07/2013 0351   RBC 3.66* 07/07/2013 0351   HGB 12.0 07/07/2013 0351   HCT 35.3* 07/07/2013 0351   PLT 342 07/07/2013 0351   MCV 96.4 07/07/2013 0351   MCH 32.8 07/07/2013 0351   MCHC 34.0 07/07/2013 0351   RDW 13.9 07/07/2013 0351   LYMPHSABS 1.5 07/07/2013 0351   MONOABS 1.1* 07/07/2013 0351   EOSABS 0.0 07/07/2013 0351   BASOSABS 0.0 07/07/2013 0351    BMET    Component  Value Date/Time   NA 142 07/07/2013 0351   K 3.2* 07/07/2013 0351   CL 101 07/07/2013 0351   CO2 27 07/07/2013 0351   GLUCOSE 135* 07/07/2013 0351   BUN 16 07/07/2013 0351   CREATININE 0.74 07/07/2013 0351   CALCIUM 9.3 07/07/2013 0351   GFRNONAA 78* 07/07/2013  0351   GFRAA >90 07/07/2013 0351    CMP     Component Value Date/Time   NA 142 07/07/2013 0351   K 3.2* 07/07/2013 0351   CL 101 07/07/2013 0351   CO2 27 07/07/2013 0351   GLUCOSE 135* 07/07/2013 0351   BUN 16 07/07/2013 0351   CREATININE 0.74 07/07/2013 0351   CALCIUM 9.3 07/07/2013 0351   PROT 6.6 07/06/2013 0415   ALBUMIN 2.6* 07/06/2013 0415   AST 32 07/06/2013 0415   ALT 25 07/06/2013 0415   ALKPHOS 141* 07/06/2013 0415   BILITOT 0.4 07/06/2013 0415   GFRNONAA 78* 07/07/2013 0351   GFRAA >90 07/07/2013 0351       Time In Time Out Total Time Spent with Patient Total Overall Time  1400 1600 110 min 120 min    Greater than 50%  of this time was spent counseling and coordinating care related to the above assessment and plan.   Wadie Lessen NP  Palliative Medicine Team Team Phone # [phone] Pager [phone]  Discussed with Dr Joya Gaskins

## 2013-07-07 NOTE — Progress Notes (Signed)
[facility] Progress Note Patient Name: Monique Russo DOB: 08-08-1930 MRN: [id number]  Date of Service  07/07/2013   HPI/Events of Note   Pt with ongoing agitation, withdrawal state  eICU Interventions  depakote ordered IV 250mg  q6H Discussed with palliative care MD   Intervention Category Major Interventions: Delirium, psychosis, severe agitation - evaluation and management  Elsie Stain 07/07/2013, 9:49 PM

## 2013-07-07 NOTE — Progress Notes (Signed)
[facility] PULMONARY / CRITICAL CARE MEDICINE   Name: Monique Russo MRN: [id number] DOB: 10-Feb-1931    ADMISSION DATE:  07/03/2013 CONSULTATION DATE:  07/03/2013  REFERRING MD :  EDP PRIMARY SERVICE: TRH  CHIEF COMPLAINT:  Hypotension  BRIEF PATIENT DESCRIPTION: 80 year old female with history of chronic back pain on high doses of opioids for DJD presented to ED with altered mental status. In ED found to be hypotensive, persistent in spite of fluid resuscitation.  SIGNIFICANT EVENTS / STUDIES:  4/17  Doppler LE >>> No DVTs, L popliteal fossa. 4/18  Precedex required /4/20 off  LINES / TUBES: L IJ CVL 4/17 >>> L R AL 4/17 >>>4/18 4/16  Foley >>>  CULTURES: 4/16  Blood  >>> 4/16  Urine>>>neg 4/16 Sputum  >>>  ANTIBIOTICS: Zosyn 4/16 >>>4/18 Vancomycin 4/16 >>>4/18  INTERVAL HISTORY:  Uncomfortable but calm. Follows commands.  VITAL SIGNS: Temp:  [97.4 F (36.3 C)-100.2 F (37.9 C)] 98.2 F (36.8 C) (04/20 0735) Pulse Rate:  [71-96] 91 (04/20 0735) Resp:  [17-29] 24 (04/20 0735) BP: (123-141)/(60-104) 134/82 mmHg (04/20 0735) SpO2:  [92 %-95 %] 95 % (04/20 0735)  HEMODYNAMICS:   VENTILATOR SETTINGS:   INTAKE / OUTPUT: Intake/Output     04/19 0701 - 04/20 0700 04/20 0701 - 04/21 0700   P.O. 855    I.V. (mL/kg)  3 (0)   Total Intake(mL/kg) 855 (9.8) 3 (0)   Urine (mL/kg/hr) 1100 (0.5)    Stool 2 (0) 1 (0)   Total Output 1102 1   Net -247 +2        Urine Occurrence 1 x    Stool Occurrence 2 x     PHYSICAL EXAMINATION: General:  No acute distress Neuro:  Follows commands by mae x4 HEENT:  Atraumatic Cardiovascular:  RSR monitor Lungs: Unlabored w/o audible wheeze, diminished in bases  Abdomen:  Rotund, + bs, co diffuse tenderness Musculoskeletal:  No obvious deformity.  Skin:  BLE edema / erythema.   LABS:  CBC  Recent Labs Lab 07/04/13 0825 07/06/13 0415 07/07/13 0351  WBC 18.3* 19.4* 11.4*  HGB 9.5* 11.1* 12.0  HCT 28.1* 33.0* 35.3*  PLT  257 361 342   Coag's No results found for this basename: APTT, INR,  in the last 168 hours BMET  Recent Labs Lab 07/04/13 0825 07/06/13 0415 07/07/13 0351  NA 135* 140 142  K 3.0* 3.0* 3.2*  CL 93* 99 101  CO2 30 25 27   BUN 23 14 16   CREATININE 0.84 0.68 0.74  GLUCOSE 158* 105* 135*   Electrolytes  Recent Labs Lab 07/04/13 0825 07/06/13 0415 07/07/13 0351  CALCIUM 8.6 9.5 9.3   Sepsis Markers  Recent Labs Lab 07/03/13 1944 07/04/13 0037 07/04/13 0155 07/06/13 0415  LATICACIDVEN 1.06  --  2.2  --   PROCALCITON  --  0.40  --  0.22   ABG  Recent Labs Lab 07/03/13 2224  PHART 7.408  PCO2ART 57.9*  PO2ART 77.0*   Liver Enzymes  Recent Labs Lab 07/03/13 1923 07/06/13 0415  AST 24 32  ALT 16 25  ALKPHOS 69 141*  BILITOT 0.4 0.4  ALBUMIN 3.0* 2.6*   Cardiac Enzymes  Recent Labs Lab 07/04/13 0036 07/04/13 0155 07/04/13 0825 07/04/13 1304  TROPONINI  --  <0.30 <0.30 <0.30  PROBNP 1646.0*  --   --   --    Glucose  Recent Labs Lab 07/04/13 0105  GLUCAP 115*   IMAGING:  No results found. ASSESSMENT /  PLAN:  PULMONARY A:  Acute respiratory failure Pulmonary edema No evidence pneumonia P:   Goal SpO2>92, 4/20 achieved  Supplemental oxygen IS if able  CARDIOVASCULAR A:  Sepsis vs hypotension related to opioid overdose, responded to Narcan chronic diastolic congestive heart failure P:  CVL, no cvp needed Dc a-line TTE Wean stress steroids, 4/20 decrease to 50 q 12h  RENAL A:   Appears euvolemic  hypokalemia P:   Replace K  Oral , 40 kdur po  kvo Allow pos balance, no chf  GASTROINTESTINAL A:   Nutrition GI Px P:   Protonix Diet ok  HEMATOLOGIC A:  Leukocytosis VTE Px P:  Trend CBC in with pos balance Heparin  INFECTIOUS A:   Cellulitis not noted P:   Dc abx BP rapid resolution observe  ENDOCRINE A: Suspected adrenal insufficiency P:   Continue  Hydrocortisone at reduced dose  NEUROLOGIC A:   Encephalopathy(4/20 much more awake and interactive) Chronic opioids use Likely opioids overdose Angry Palliative Care is stepping in now. Patient has been narcotic dependent for years. Do not expect this to change. Need to avoid sharp swings of dosing strategy. P:   Haldol scheduled, qtc Oral Risperdal consideration, call psych prior Cont fent, may need further  escallation likely needs psych to include family counseling. Consider Requip for restless legs- family dx. Pall care involved  Richardson Landry Minor ACNP Maryanna Shape PCCM Pager [phone] till 3 pm If no answer page [phone] 07/07/2013, 10:50 AM   To traid  I have fully examined this patient and agree with above findings.    And edite dinull  Lavon Paganini. Titus Mould, MD, [place] Pgr: [facility]

## 2013-07-07 NOTE — Consult Note (Signed)
[facility] Face-to-Face Psychiatry Consult   Reason for Consult:  Chronic pain and narcotic use Referring Physician:  Dr Monique Russo Monique Russo is an 80 y.o. female. Total Time spent with patient: 20 minutes  Assessment: AXIS I:  Opiate dependence AXIS II:  Deferred AXIS III:   Past Medical History  Diagnosis Date  . Arthritis    AXIS IV:  problems related to social environment AXIS V:  51-60 moderate symptoms  Plan:  No evidence of imminent risk to self or others at present.   Patient does not meet criteria for psychiatric inpatient admission. Supportive therapy provided about ongoing stressors. Discussed crisis plan, support from social network, calling 911, coming to the Emergency Department, and calling Suicide Hotline.  Subjective:   Monique Russo is a 80 y.o. female patient admitted with altered mental status, chronic pain syndrome and on high dose of pain medication.  HPI:  Patient seen chart reviewed.  Patient was admitted for above reason and consult was called for medication management.  I met with patient's daughter who was not happy to see psychiatrist and does not want any intervention from this Probation officer.  Patient has recently seen by palliative care and family was to coordinate palliative care for medication management.  Apparently patient has history of taking the medication and recently she was found hypertensive and given Narcan.  Patient and her family denies any previous history of psychiatric inpatient treatment, suicidal attempt or any psychosis.  Family endorse that she was given pain medication 15 years ago which has been managed by her pain specialist without any problem.  Patient appears somewhat anxious and she was having jerking movements in her right arm.  Her daughter admitted that recently she has been confused, agitated which could be withdrawal symptoms from not enough her current medication.  The patient lives with her husband.  She was minimally involved  in conversation in most of the information was obtained from her 2 daughters.  As per Dr. Virl Russo has no history of aggression, violence, paranoia or any psychosis.  There is no history of suicidal attempt or inpatient psychiatric treatment.  The patient has a good support from her husband and from her family.   Past Psychiatric History: Past Medical History  Diagnosis Date  . Arthritis     reports that she has never smoked. She does not have any smokeless tobacco history on file. She reports that she drinks alcohol. Her drug history is not on file. No family history on file.   Living Arrangements: Spouse/significant other   Abuse/Neglect [facility]) Physical Abuse: Denies Verbal Abuse: Denies Sexual Abuse: Denies Allergies:  No Known Allergies  ACT Assessment Complete:  Yes:    Educational Status    Risk to Self: Risk to self Is patient at risk for suicide?: No Substance abuse history and/or treatment for substance abuse?: No  Risk to Others:    Abuse: Abuse/Neglect Assessment (Assessment to be complete while patient is alone) Physical Abuse: Denies Verbal Abuse: Denies Sexual Abuse: Denies Exploitation of patient/patient's resources: Denies Self-Neglect: Denies  Prior Inpatient Therapy:    Prior Outpatient Therapy:    Additional Information:                    Objective: Blood pressure 124/73, pulse 68, temperature 98.5 F (36.9 C), temperature source Axillary, resp. rate 27, height '5\' 2"'  (1.575 m), weight 192 lb 7.4 oz (87.3 kg), SpO2 94.00%.Body mass index is 35.19 kg/(m^2). Results for orders placed during the  hospital encounter of 07/03/13 (from the past 72 hour(s))  COMPREHENSIVE METABOLIC PANEL     Status: Abnormal   Collection Time    07/06/13  4:15 AM      Result Value Ref Range   Sodium 140  137 - 147 mEq/L   Potassium 3.0 (*) 3.7 - 5.3 mEq/L   Chloride 99  96 - 112 mEq/L   CO2 25  19 - 32 mEq/L   Glucose, Bld 105 (*) 70 - 99 mg/dL   BUN 14  6 - 23  mg/dL   Creatinine, Ser 0.68  0.50 - 1.10 mg/dL   Calcium 9.5  8.4 - 10.5 mg/dL   Total Protein 6.6  6.0 - 8.3 g/dL   Albumin 2.6 (*) 3.5 - 5.2 g/dL   AST 32  0 - 37 U/L   ALT 25  0 - 35 U/L   Alkaline Phosphatase 141 (*) 39 - 117 U/L   Total Bilirubin 0.4  0.3 - 1.2 mg/dL   GFR calc non Af Amer 80 (*) >90 mL/min   GFR calc Af Amer >90  >90 mL/min   Comment: (NOTE)     The eGFR has been calculated using the CKD EPI equation.     This calculation has not been validated in all clinical situations.     eGFR's persistently <90 mL/min signify possible Chronic Kidney     Disease.  CBC WITH DIFFERENTIAL     Status: Abnormal   Collection Time    07/06/13  4:15 AM      Result Value Ref Range   WBC 19.4 (*) 4.0 - 10.5 K/uL   RBC 3.38 (*) 3.87 - 5.11 MIL/uL   Hemoglobin 11.1 (*) 12.0 - 15.0 g/dL   HCT 33.0 (*) 36.0 - 46.0 %   MCV 97.6  78.0 - 100.0 fL   MCH 32.8  26.0 - 34.0 pg   MCHC 33.6  30.0 - 36.0 g/dL   RDW 13.6  11.5 - 15.5 %   Platelets 361  150 - 400 K/uL   Comment: REPEATED TO VERIFY   Neutrophils Relative % 81 (*) 43 - 77 %   Neutro Abs 15.8 (*) 1.7 - 7.7 K/uL   Lymphocytes Relative 11 (*) 12 - 46 %   Lymphs Abs 2.1  0.7 - 4.0 K/uL   Monocytes Relative 8  3 - 12 %   Monocytes Absolute 1.5 (*) 0.1 - 1.0 K/uL   Eosinophils Relative 0  0 - 5 %   Eosinophils Absolute 0.0  0.0 - 0.7 K/uL   Basophils Relative 0  0 - 1 %   Basophils Absolute 0.0  0.0 - 0.1 K/uL  PROCALCITONIN     Status: None   Collection Time    07/06/13  4:15 AM      Result Value Ref Range   Procalcitonin 0.22     Comment:            Interpretation:     PCT (Procalcitonin) <= 0.5 ng/mL:     Systemic infection (sepsis) is not likely.     Local bacterial infection is possible.     (NOTE)             ICU PCT Algorithm               Non ICU PCT Algorithm        ----------------------------     ------------------------------             PCT <  0.25 ng/mL                 PCT < 0.1 ng/mL         Stopping of  antibiotics            Stopping of antibiotics           strongly encouraged.               strongly encouraged.        ----------------------------     ------------------------------           PCT level decrease by               PCT < 0.25 ng/mL           >= 80% from peak PCT           OR PCT 0.25 - 0.5 ng/mL          Stopping of antibiotics                                                 encouraged.         Stopping of antibiotics               encouraged.        ----------------------------     ------------------------------           PCT level decrease by              PCT >= 0.25 ng/mL           < 80% from peak PCT            AND PCT >= 0.5 ng/mL            Continuing antibiotics                                                  encouraged.           Continuing antibiotics                encouraged.        ----------------------------     ------------------------------         PCT level increase compared          PCT > 0.5 ng/mL             with peak PCT AND              PCT >= 0.5 ng/mL             Escalation of antibiotics                                              strongly encouraged.          Escalation of antibiotics            strongly encouraged.  BASIC METABOLIC PANEL     Status: Abnormal   Collection Time    07/07/13  3:51 AM      Result Value Ref Range   Sodium 142  137 -  147 mEq/L   Potassium 3.2 (*) 3.7 - 5.3 mEq/L   Chloride 101  96 - 112 mEq/L   CO2 27  19 - 32 mEq/L   Glucose, Bld 135 (*) 70 - 99 mg/dL   BUN 16  6 - 23 mg/dL   Creatinine, Ser 0.74  0.50 - 1.10 mg/dL   Calcium 9.3  8.4 - 10.5 mg/dL   GFR calc non Af Amer 78 (*) >90 mL/min   GFR calc Af Amer >90  >90 mL/min   Comment: (NOTE)     The eGFR has been calculated using the CKD EPI equation.     This calculation has not been validated in all clinical situations.     eGFR's persistently <90 mL/min signify possible Chronic Kidney     Disease.  CBC WITH DIFFERENTIAL     Status: Abnormal   Collection  Time    07/07/13  3:51 AM      Result Value Ref Range   WBC 11.4 (*) 4.0 - 10.5 K/uL   RBC 3.66 (*) 3.87 - 5.11 MIL/uL   Hemoglobin 12.0  12.0 - 15.0 g/dL   HCT 35.3 (*) 36.0 - 46.0 %   MCV 96.4  78.0 - 100.0 fL   MCH 32.8  26.0 - 34.0 pg   MCHC 34.0  30.0 - 36.0 g/dL   RDW 13.9  11.5 - 15.5 %   Platelets 342  150 - 400 K/uL   Neutrophils Relative % 77  43 - 77 %   Neutro Abs 8.8 (*) 1.7 - 7.7 K/uL   Lymphocytes Relative 13  12 - 46 %   Lymphs Abs 1.5  0.7 - 4.0 K/uL   Monocytes Relative 10  3 - 12 %   Monocytes Absolute 1.1 (*) 0.1 - 1.0 K/uL   Eosinophils Relative 0  0 - 5 %   Eosinophils Absolute 0.0  0.0 - 0.7 K/uL   Basophils Relative 0  0 - 1 %   Basophils Absolute 0.0  0.0 - 0.1 K/uL   Labs are reviewed.  Current Facility-Administered Medications  Medication Dose Route Frequency Provider Last Rate Last Dose  . 0.9 %  sodium chloride infusion   Intravenous Continuous Doree Fudge, MD 20 mL/hr at 07/04/13 2102    . acetaminophen (TYLENOL) tablet 650 mg  650 mg Oral Q6H PRN Othella Boyer, MD   650 mg at 07/04/13 2214  . antiseptic oral rinse (BIOTENE) solution 15 mL  15 mL Mouth Rinse BID Rhetta Mura Schorr, NP   15 mL at 07/07/13 0800  . bisacodyl (DULCOLAX) suppository 10 mg  10 mg Rectal Daily PRN Colbert Coyer, MD   10 mg at 07/04/13 2152  . diphenhydrAMINE (BENADRYL) injection 25 mg  25 mg Intravenous Q6H PRN Raylene Miyamoto, MD   25 mg at 07/07/13 [phone]  . fentaNYL (DURAGESIC - dosed mcg/hr) 50 mcg  50 mcg Transdermal Q72H Raylene Miyamoto, MD   50 mcg at 07/05/13 1100  . haloperidol lactate (HALDOL) injection 2 mg  2 mg Intravenous Q4H Raylene Miyamoto, MD   2 mg at 07/07/13 1457  . LORazepam (ATIVAN) tablet 1 mg  1 mg Oral Q4H PRN Knox Royalty, NP      . midazolam (VERSED) injection 0.5 mg  0.5 mg Intravenous Q4H PRN Raylene Miyamoto, MD   0.5 mg at 07/07/13 1621  . morphine (MSIR) tablet 15 mg  15 mg Oral Q3H PRN Knox Royalty, NP      .  QUEtiapine (SEROQUEL) tablet 25 mg  25 mg Oral BID Knox Royalty, NP      . senna-docusate (Senokot-S) tablet 1 tablet  1 tablet Oral BID Colbert Coyer, MD   1 tablet at 07/07/13 [phone]  . sodium chloride 0.9 % bolus 500 mL  500 mL Intravenous Once Cherene Altes, MD      . sodium chloride 0.9 % injection 3 mL  3 mL Intravenous Q12H Orvan Falconer, MD   3 mL at 07/07/13 0957  . thiamine (VITAMIN B-1) tablet 100 mg  100 mg Oral Daily Raylene Miyamoto, MD   100 mg at 07/07/13 0956  . timolol (TIMOPTIC) 0.5 % ophthalmic solution 1 drop  1 drop Both Eyes Daily Orvan Falconer, MD   1 drop at 07/07/13 0956    Psychiatric Specialty Exam:     Blood pressure 124/73, pulse 68, temperature 98.5 F (36.9 C), temperature source Axillary, resp. rate 27, height '5\' 2"'  (1.575 m), weight 192 lb 7.4 oz (87.3 kg), SpO2 94.00%.Body mass index is 35.19 kg/(m^2).  General Appearance: Guarded and Anxious  Eye Contact::  Poor  Speech:  Pressured  Volume:  Decreased  Mood:  Irritable  Affect:  Inappropriate and Labile  Thought Process:  Loose  Orientation:  Full (Time, Place, and Person)  Thought Content:  Rumination  Suicidal Thoughts:  No  Homicidal Thoughts:  No  Memory:  Immediate;   Fair Recent;   Poor Remote;   Fair  Judgement:  Intact  Insight:  Fair  Psychomotor Activity:  Increased, Restlessness and Jerky movements on right hand  Concentration:  Poor  Recall:  [place]: Fair  Akathisia:  No  Handed:  Right  AIMS (if indicated):     Assets:  Housing Social Support  Sleep:      Musculoskeletal: Strength & Muscle Tone: Unable to assess Gait & Station: The patient is lying on the bed Patient leans: N/A  Treatment Plan Summary: Medication management, patient's family wants palliative care to be involved in her treatment for medication management.  However I have left option to try low-dose Depakote to help the agitation , irritability if not medically  contraindicated .  Patient's family will discuss with palliative care .  We will sign off however if there is any question or needed to reconsult then please call [phone].    Arlyce Harman Arfeen 07/07/2013 6:22 PM

## 2013-07-07 NOTE — Progress Notes (Signed)
eLink Physician-Brief Progress Note Patient Name: Kaylen Motl DOB: 18-Aug-1930 MRN: [id number]  Date of Service  07/07/2013   HPI/Events of Note  Pall care called Elink;  Family now ready for comfort measure only status and focus on pain management primarily  eICU Interventions  Pt made comfort measures primary status    Intervention Category Major Interventions: End of life / care limitation discussion  Elsie Stain 07/07/2013, 4:31 PM

## 2013-07-07 NOTE — Progress Notes (Signed)
Asked by elink to go to bedside  BEdside RN attests that family was upset at perceived lack of patient comfort but now patient comfortable and sleeping and they are more assured. IV depakon going to be administered./ Went to bedside: 2 x female family members explained to me she was sleeping and not to touch her or wake her up. Visiual inspection: patient sleeping comfortably.  No change in prior plan   Dr. Brand Males, M.D., [facility].C.P Pulmonary and Critical Care Medicine Staff Physician [facility] Pager: [phone], If no answer or between  15:00h - 7:00h: call [phone]  07/07/2013 10:44 PM

## 2013-07-08 DIAGNOSIS — Z66 Do not resuscitate: Secondary | ICD-10-CM

## 2013-07-08 DIAGNOSIS — I2789 Other specified pulmonary heart diseases: Secondary | ICD-10-CM

## 2013-07-08 DIAGNOSIS — D72829 Elevated white blood cell count, unspecified: Secondary | ICD-10-CM

## 2013-07-08 DIAGNOSIS — I272 Pulmonary hypertension, unspecified: Secondary | ICD-10-CM | POA: Diagnosis present

## 2013-07-08 DIAGNOSIS — T400X1A Poisoning by opium, accidental (unintentional), initial encounter: Secondary | ICD-10-CM

## 2013-07-08 MED ORDER — VALPROATE SODIUM 500 MG/5ML IV SOLN
500.0000 mg | Freq: Four times a day (QID) | INTRAVENOUS | Status: DC
  Filled 2013-07-08 (×3): qty 5

## 2013-07-08 MED ORDER — VALPROATE SODIUM 500 MG/5ML IV SOLN
500.0000 mg | Freq: Three times a day (TID) | INTRAVENOUS | Status: DC
  Filled 2013-07-08 (×3): qty 5

## 2013-07-08 MED ORDER — LEVOFLOXACIN 750 MG PO TABS
750.0000 mg | ORAL_TABLET | ORAL | Status: DC
  Administered 2013-07-08 – 2013-07-10 (×3): 750 mg via ORAL
  Filled 2013-07-08 (×3): qty 1

## 2013-07-08 MED ORDER — VALPROATE SODIUM 500 MG/5ML IV SOLN
500.0000 mg | Freq: Three times a day (TID) | INTRAVENOUS | Status: DC
  Administered 2013-07-08 – 2013-07-09 (×2): 500 mg via INTRAVENOUS
  Filled 2013-07-08 (×4): qty 5

## 2013-07-08 NOTE — Progress Notes (Signed)
Pt has been resting comfortably sleeping on and off since last note recorded. Pt's family has been present all day. Pt has had no further complaints of pain in throat or rt ear since this am. Pt has had no bowel movements today. Pt has a foley for comfort care at this time. Pt has had no periods of shaking hands, arms and legs today that have upset her. Pt has had only slight involuntary movements of rt hand at times throughout day. Pt is resting.

## 2013-07-08 NOTE — Progress Notes (Signed)
NP and MD in and had a meeting with pt and pt's family discussing path of care. Pt is now sleeping after receiving medications. Will monitor.

## 2013-07-08 NOTE — Progress Notes (Signed)
Pt has been sleeping up until 30 minutes ago since last note recorded. Pt has been calm visiting with family. Pt ate a small amount for late lunch. Pt is drinking fluid; iced tea full glass. Pt has stated no pain and has had no signs of agitation up until this point. Will continue to monitor.

## 2013-07-08 NOTE — Progress Notes (Signed)
[place] TEAM 1 - Stepdown/ICU TEAM Progress Note  Hala Narula RWE:[id number] DOB: Mar 24, 1930 DOA: 07/03/2013 PCP: No primary provider on file.  Admit HPI / Brief Narrative: Monique Russo is an 80 y.o. WF PMHx arthritis, chronic pain syndrome on high dose morphine under pain management ( Long acting 100mg  morphine TID, along with Percocet PRN), brought to the ER feeling malaise for one day. She has been coughing for a few days, mostly nonproductive. In the ER, work up included marked leukocytosis with WBC of 25K, Hb of 11 grams per dL, and normal renal Fx test. Her CXR showed vascular congestion and intersitial edema. She was also hypotensive with SBP of 80's. Her UA showed no evidence of infection and her lactic acid was not elevated. She does have an erythema of her left lower extremity. She was given 2.5 L of IVF already, and still hypotensive. She was subsequently started on Levophed, with resulting BP of 104. She was lethargic, but arousable. Her ABD showed pH of 7.4, with pCO2 of 58, and pa Ox of 77. She was consulted with Dr Nelda Marseille of PCCM by EDP as she was felt to be in septic shock, with leukocytosis, and hypotension, and was on Levophed, however, it was recommended that she be admitted to hosptalist service. I saw her and admitted her, however, it was policy that since she was on Levophed, she has to be in the ICU, and ICU is a closed unit. PCCM was again consulted, and Georgann Housekeeper evaluated her in the ER, felt that she can be off of Levophed, so it was discontinued. She was now recommended to be admitted to the SDU. Hospitalist was asked to admit her to the SDU.   HPI/Subjective: 4/21 patient states has sore throat/sore right ear. Husband/daughter/son at bedside; unclear as to the plan of care family expects. Per palliative care NP Wadie Lessen medications were to be discontinued/minimized, negative labs, negative telemetry, negative consults, no artificial feeding now or in the  future, O2 only for patient's comfort,PO antibiotics only if deemed necessary.  Patient was started on Depakote for agitation by psychiatry. Son present during exam and states that patient has a history of underlying psychiatric issues which have not been addressed. Son states mother has suffered from perseveration for some time and that this is acute. Son also states patient suffer from jerking movements of hands and arms (tremors) which resolved if patient is left on devices were talked to in soothing manner.  Assessment/Plan: Acute respiratory failure -Resolving patient's SpO2 91%-100% 2 L [place] -Continue O2 to maintain SpO2> 92% -Possibly secondary to underlying pneumonia which would normally be classified as HCAP and treated with IV antibiotics. However family decided they want no IV medication but will accept PO antibiotics. Family has been counseled by myself and palliative care that this is not the preferred regimen but would initiate treatment for CAP.  -Per family's wishes no other treatment will be off for i.e. negative breathing treatments, pulmonary toilet, IV medication, tube feedings. -Would complete seven-day course of PO antibiotics -Narcotic overdose most likely attributed to acute Respiratory failure.  HCAP vs CAP -See acute respiratory failure  Leukocytosis -All labs will be stopped per family's wishes  Chronic high dose opioid use -Narcotic medication has been [facility] -Psychiatry consult and recommended Depakote 500 mgTID; transition over to PO ASAP in preparation for patient being discharged to SNF when available.   Agitation secondary to bipolar vs narcotic abuse -See chronic high dose opioid use -DC'd Versed  for agitation  -Continue Ativan PRN convert to PO ASAP if Pt's cognition improves -Continue Haldol IV 2 mg QID  Pulmonary hypertension -Currently patient hypotensive, bradycardic   Disposition - see NP Wadie Lessen (palliative care) notes -Very confusing situation  family insists they want comfort care with conditions (PO ABx). -Husband and one daughter very close to the idea that patient's long psychiatric history and high narcotic use could be causing/exacerbating situation. However he insists they want no further evaluation/monitoring tests performed. -Will work closely with palliative care to try and ensure patient is receiving appropriate care    Code Status: FULL Family Communication: no family present at time of exam Disposition Plan: SNF?    Consultants: Dr. Berniece Andreas (psychiatry) NP Wadie Lessen (palliative care)     Procedure/Significant Events: PCXR -4/17 Persistent bibasilar patchy airspace opacities -4/17 Doppler LE >>> No DVTs, L popliteal fossa.  -4/18 Precedex required /4/20 off  -05/22/2013 echocardiogram - LVEF= 50% to 55%.  - Aortic valve: Mild regurgitation. - Tricuspid valve: Moderate regurgitation. - Pulmonary arteries: Systolic pressure was mildly increased. PA peak pressure: 73mm Hg (S).     Culture 4/16 Blood >>>  4/16 Urine>>>neg  4/16 Sputum >>>   Antibiotics: Levofloxacin PO 4/21>>  Zosyn 4/16 >>> stopped 4/18  Vancomycin 4/16 >>> stopped 4/18   DVT prophylaxis:    Devices    LINES / TUBES:  L IJ CVL 4/17 >>>  L R AL 4/17 >>>4/18  4/16 Foley >>>      Continuous Infusions: . sodium chloride 20 mL/hr at 07/04/13 2102    Objective: VITAL SIGNS:   FIO2:   Intake/Output Summary (Last 24 hours) at 07/08/13 0835 Last data filed at 07/07/13 2140  Gross per 24 hour  Intake      6 ml  Output   1201 ml  Net  -1195 ml     Exam: General: No acute respiratory distress, lethargic, arousable with painful stimuli but becomes very agitated Lungs: Clear to auscultation bilaterally without wheezes or crackles Cardiovascular: Regular rate and rhythm without murmur gallop or rub normal S1 and S2 Renalbalance today;        /overall;        Creatinine ;        Hourly output   Abdomen:  Nontender, nondistended, soft, bowel sounds positive, no rebound, no ascites, no appreciable mass Extremities: No significant cyanosis, clubbing, or edema bilateral lower extremities  Data Reviewed: Basic Metabolic Panel:  Recent Labs Lab 07/03/13 1923 07/04/13 0825 07/06/13 0415 07/07/13 0351  NA 129* 135* 140 142  K 3.3* 3.0* 3.0* 3.2*  CL 81* 93* 99 101  CO2 36* 30 25 27   GLUCOSE 122* 158* 105* 135*  BUN 34* 23 14 16   CREATININE 1.03 0.84 0.68 0.74  CALCIUM 10.1 8.6 9.5 9.3   Liver Function Tests:  Recent Labs Lab 07/03/13 1923 07/06/13 0415  AST 24 32  ALT 16 25  ALKPHOS 69 141*  BILITOT 0.4 0.4  PROT 6.9 6.6  ALBUMIN 3.0* 2.6*   No results found for this basename: LIPASE, AMYLASE,  in the last 168 hours No results found for this basename: AMMONIA,  in the last 168 hours CBC:  Recent Labs Lab 07/03/13 1923 07/04/13 0825 07/06/13 0415 07/07/13 0351  WBC 24.9* 18.3* 19.4* 11.4*  NEUTROABS 23.0*  --  15.8* 8.8*  HGB 11.1* 9.5* 11.1* 12.0  HCT 31.9* 28.1* 33.0* 35.3*  MCV 96.4 97.9 97.6 96.4  PLT 301 257 361 342  Cardiac Enzymes:  Recent Labs Lab 07/04/13 0155 07/04/13 0825 07/04/13 1304  TROPONINI <0.30 <0.30 <0.30   BNP (last 3 results)  Recent Labs  07/04/13 0036  PROBNP 1646.0*   CBG:  Recent Labs Lab 07/04/13 0105  GLUCAP 115*    Recent Results (from the past 240 hour(s))  CULTURE, BLOOD (ROUTINE X 2)     Status: None   Collection Time    07/03/13  7:25 PM      Result Value Ref Range Status   Specimen Description BLOOD RIGHT ARM   Final   Special Requests BOTTLES DRAWN AEROBIC ONLY 10CC   Final   Culture  Setup Time     Final   Value: 07/04/2013 01:20     Performed at [facility]   Culture     Final   Value:        BLOOD CULTURE RECEIVED NO GROWTH TO DATE CULTURE WILL BE HELD FOR 5 DAYS BEFORE ISSUING A FINAL NEGATIVE REPORT     Performed at [facility]   Report Status PENDING   Incomplete  CULTURE, BLOOD  (ROUTINE X 2)     Status: None   Collection Time    07/03/13  7:35 PM      Result Value Ref Range Status   Specimen Description BLOOD LEFT ARM   Final   Special Requests BOTTLES DRAWN AEROBIC AND ANAEROBIC 10CC   Final   Culture  Setup Time     Final   Value: 07/04/2013 01:20     Performed at [facility]   Culture     Final   Value:        BLOOD CULTURE RECEIVED NO GROWTH TO DATE CULTURE WILL BE HELD FOR 5 DAYS BEFORE ISSUING A FINAL NEGATIVE REPORT     Performed at [facility]   Report Status PENDING   Incomplete  MRSA PCR SCREENING     Status: None   Collection Time    07/04/13 12:58 AM      Result Value Ref Range Status   MRSA by PCR NEGATIVE  NEGATIVE Final   Comment:            The GeneXpert MRSA Assay (FDA     approved for NASAL specimens     only), is one component of a     comprehensive MRSA colonization     surveillance program. It is not     intended to diagnose MRSA     infection nor to guide or     monitor treatment for     MRSA infections.  URINE CULTURE     Status: None   Collection Time    07/04/13  1:10 AM      Result Value Ref Range Status   Specimen Description URINE, CATHETERIZED   Final   Special Requests Normal   Final   Culture  Setup Time     Final   Value: 07/04/2013 09:22     Performed at [facility] Count     Final   Value: NO GROWTH     Performed at [facility]   Culture     Final   Value: NO GROWTH     Performed at [facility]   Report Status 07/05/2013 FINAL   Final     Studies:  Recent x-ray studies have been reviewed in detail by the Attending Physician  Scheduled Meds:  Scheduled Meds: . antiseptic  oral rinse  15 mL Mouth Rinse BID  . fentaNYL  50 mcg Transdermal Q72H  . haloperidol lactate  2 mg Intravenous Q4H  . QUEtiapine  25 mg Oral BID  . senna-docusate  1 tablet Oral BID  . sodium chloride  500 mL Intravenous Once  . sodium chloride  3 mL Intravenous Q12H  .  thiamine  100 mg Oral Daily  . timolol  1 drop Both Eyes Daily  . valproate sodium  250 mg Intravenous 4 times per day    Time spent on care of this patient: 35 mins   Allie Bossier , MD   [facility] Office  [phone] Pager [phone]  On-Call/Text Page:      [email or web address]      password [email or web address]  If 7PM-7AM, please contact night-coverage [email or web address] Password [email or web address] 07/08/2013, 8:35 AM   LOS: 5 days

## 2013-07-08 NOTE — Consult Note (Signed)
I have reviewed this case with our NP and agree with the Assessment and Plan as stated.  Neshawn Aird L. Jerin Franzel, MD MBA The Palliative Medicine Team at [place] Team Phone: [phone] Pager: [phone]   

## 2013-07-08 NOTE — Care Management Note (Addendum)
    Page 1 of 1   07/10/2013     4:39:51 PM CARE MANAGEMENT NOTE 07/10/2013  Patient:  Monique Russo, Monique Russo   Account Number:  [id number]  Date Initiated:  07/07/2013  Documentation initiated by:  MAYO,HENRIETTA  Subjective/Objective Assessment:   dx narcotic OD; lives with spouse, has w/c and walker     Action/Plan:   Anticipated DC Date:  07/10/2013   Anticipated DC Plan:  [place]  In-house referral  Clinical Social Worker      DC Planning Services  CM consult      Choice offered to / List presented to:             Status of service:  Completed, signed off Medicare Important Message given?   (If response is "NO", the following Medicare IM given date fields will be blank) Date Medicare IM given:   Date Additional Medicare IM given:    Discharge Disposition:  [place]  Per UR Regulation:  Reviewed for med. necessity/level of care/duration of stay  If discussed at [place] of Stay Meetings, dates discussed:    Comments:  07/10/13 [place], BSN [phone] patient dc to [facility], CSW following.  07/07/13 [place] CM met with spouse and 2 dtrs and discussed home services available.  Spouse states he has had increasing difficulty caring for pt and feels that taking her home without 24/7 assistance is not safe, dtrs agree as they are limited in the amount of help they can provide.  . After getting information about home services available, family agree that pt will require SNF placement.  CM will notify CSW.

## 2013-07-08 NOTE — Progress Notes (Signed)
Pt awake, sitting up in bed, no acute distress noted, no involuntary movement of extremities, denies complaint of pain. Family visiting at bedside. Will continue to monitor.

## 2013-07-08 NOTE — Progress Notes (Signed)
Pt is still sleeping. Pt's son and family member at bedside. Pt's daughter and husband went home to get some sleep. Will continue to monitor.

## 2013-07-08 NOTE — Progress Notes (Signed)
Progress Note from the Palliative Medicine Team at [place]:  -patient is resting comfortably at this time  -continued discussion with husband, daughter/Diane and son/Bill at bedside regarding [place] and realistic treatment strategies for this patient's agitation and chronic pain and the importance of accurate medical/psychaitric history    Objective: No Known Allergies Scheduled Meds: . antiseptic oral rinse  15 mL Mouth Rinse BID  . fentaNYL  50 mcg Transdermal Q72H  . haloperidol lactate  2 mg Intravenous Q4H  . levofloxacin  750 mg Oral Q24H  . QUEtiapine  25 mg Oral BID  . senna-docusate  1 tablet Oral BID  . sodium chloride  500 mL Intravenous Once  . sodium chloride  3 mL Intravenous Q12H  . thiamine  100 mg Oral Daily  . timolol  1 drop Both Eyes Daily  . valproate sodium  500 mg Intravenous 4 times per day   Continuous Infusions: . sodium chloride 20 mL/hr at 07/04/13 2102   PRN Meds:.acetaminophen, bisacodyl, diphenhydrAMINE, LORazepam, midazolam, morphine  BP 89/52  Pulse 53  Temp(Src) 98.7 F (37.1 C) (Axillary)  Resp 15  Ht 5\' 2"  (1.575 m)  Wt 87.3 kg (192 lb 7.4 oz)  BMI 35.19 kg/m2  SpO2 100%   PPS: 30% at best  Pain Score:  appears comfortable  Intake/Output Summary (Last 24 hours) at 07/08/13 1532 Last data filed at 07/08/13 1025  Gross per 24 hour  Intake      6 ml  Output    700 ml  Net   -694 ml       Physical Exam:  General: chronically ill appearing, NAD HEENT:  Moistt membranes Chest:   Decreased in bases  CVS: RRR Abdomen:soft NT +BS Ext: without edema Neuro:open eyes, unable/unwilling to follow commands presently Psych: easily agitated on physical exam   Labs: CBC    Component Value Date/Time   WBC 11.4* 07/07/2013 0351   RBC 3.66* 07/07/2013 0351   HGB 12.0 07/07/2013 0351   HCT 35.3* 07/07/2013 0351   PLT 342 07/07/2013 0351   MCV 96.4 07/07/2013 0351   MCH 32.8 07/07/2013 0351   MCHC 34.0 07/07/2013 0351   RDW  13.9 07/07/2013 0351   LYMPHSABS 1.5 07/07/2013 0351   MONOABS 1.1* 07/07/2013 0351   EOSABS 0.0 07/07/2013 0351   BASOSABS 0.0 07/07/2013 0351    BMET    Component Value Date/Time   NA 142 07/07/2013 0351   K 3.2* 07/07/2013 0351   CL 101 07/07/2013 0351   CO2 27 07/07/2013 0351   GLUCOSE 135* 07/07/2013 0351   BUN 16 07/07/2013 0351   CREATININE 0.74 07/07/2013 0351   CALCIUM 9.3 07/07/2013 0351   GFRNONAA 78* 07/07/2013 0351   GFRAA >90 07/07/2013 0351    CMP     Component Value Date/Time   NA 142 07/07/2013 0351   K 3.2* 07/07/2013 0351   CL 101 07/07/2013 0351   CO2 27 07/07/2013 0351   GLUCOSE 135* 07/07/2013 0351   BUN 16 07/07/2013 0351   CREATININE 0.74 07/07/2013 0351   CALCIUM 9.3 07/07/2013 0351   PROT 6.6 07/06/2013 0415   ALBUMIN 2.6* 07/06/2013 0415   AST 32 07/06/2013 0415   ALT 25 07/06/2013 0415   ALKPHOS 141* 07/06/2013 0415   BILITOT 0.4 07/06/2013 0415   GFRNONAA 78* 07/07/2013 0351   GFRAA >90 07/07/2013 0351    Assessment and Plan: 1. Code Status:  DNR/DNI-comfort is main focus of care Family desires NO  life prolonging measures  2. Symptom Control: Pain Morphine IR 15 mg every 3 hrs prn Agitation- increase Depakote 500 mg po tid                   continueSeroquel 25 mg po bid  Discussed with family and nursing that we will keep a journal of real time sleep vs agitation.  All will try to minimize interaction when patient is agitated in order to not escalate the situation.    3. Psycho/Social: Emotional support offered to family at bedside.  Clearly there is family discord.  Diane got loud with brother in the room.  Son reports a long psych history for his mother, he believes a bi-polar diagnosis at one time, however patient never took medications that were recommended. He believe there was a suicide attempt with OD when he was a child.  He tells me his mother has "taken to the bed" for the past 20 years. It is his opinion that psych issues was "covered up" with  opioids.Son informs me that his mother has "abused drugs" for years.  She did not take as prescribed and took "whatever she wanted' He believes his father and sisters enable the patietn.  He and his brother have taken a step back from the situation  4. Disposition: Dependant on outcomes  Patient Documents Completed or Given: Document Given Completed  Advanced Directives Pkt    MOST yes   DNR    Gone from My Sight    Hard Choices      Time In Time Out Total Time Spent with Patient Total Overall Time  1100 1145 45 min 45 min    Greater than 50%  of this time was spent counseling and coordinating care related to the above assessment and plan.  Wadie Lessen NP  Palliative Medicine Team Team Phone # [phone] Pager [phone]  Discussed with Dr Sherral Hammers  PMT will f/u in the morning 1

## 2013-07-08 NOTE — Progress Notes (Signed)
Pt has been sleeping most of morning. Now that pt is awake family is visiting. Pt's family states pt has a soar throat and her right ear is bothering her. Nurse examined throat by looking with a light and did not observe any white patches. Pt asking for something for pain. Pt is getting agitated and started to repeat things over and over such as "give me something for pain, give me something for pain, why isn't she giving me something, she is so slow". Pt's family in room talking with each other and pt. Pt is getting more agitated. Nurse gives pain medication per md orders in applesauce and also gives pt something for anxiety and agitation. Pt's daughter requesting she gets pain medication and medication for agitation. Pt's daughter was very upset this am expressing her concerns to nurse about pt and what pain medication and dosage pt is receiving among other things. Nurse stated she would discuss concerns with MD and Nurse practitioner.

## 2013-07-09 DIAGNOSIS — R5381 Other malaise: Secondary | ICD-10-CM

## 2013-07-09 DIAGNOSIS — R5383 Other fatigue: Secondary | ICD-10-CM

## 2013-07-09 MED ORDER — DIVALPROEX SODIUM 500 MG PO DR TAB
500.0000 mg | DELAYED_RELEASE_TABLET | Freq: Three times a day (TID) | ORAL | Status: DC
  Administered 2013-07-09 – 2013-07-10 (×4): 500 mg via ORAL
  Filled 2013-07-09 (×8): qty 1

## 2013-07-09 NOTE — Progress Notes (Signed)
Pt transferred to 5W. Left unit CSW a voicemail handoff and invited him to call with questions. This CSW signing off.  Ky Barban, MSW, [facility] Clinical Social Worker [phone]

## 2013-07-09 NOTE — Progress Notes (Signed)
Pt up in chair for 2 hrs. PT got pt up using sling too weak to amb to chair. Family very attentive to pt's needs. Has fentanyl patch on Rt chest. C/o at times of chronic back and leg pain than falls back to sleep after repositioning.

## 2013-07-09 NOTE — Progress Notes (Signed)
Pt reports back pain, pt repositioned and medicated per MAR Morphine 15 mg IR. Pt remains calm and cooperative. Family remains at bedside. Will continue to monitor.

## 2013-07-09 NOTE — Progress Notes (Signed)
Pt resting in bed with eyes closed, respirations intact, no distress noted. Pt foley intact, remains dry, no BM noted. Pt turned and repositioned per comfort. Family at bedside. Will continue to monitor.

## 2013-07-09 NOTE — Progress Notes (Signed)
[facility] TEAM 1 - Stepdown/ICU TEAM Progress Note  Monique Russo PPJ:[id number] DOB: [birth year] DOA: 07/03/2013 PCP: No primary provider on file.  Admit HPI / Brief Narrative: 80 y.o. F PMHx arthritis, chronic pain syndrome on high dose morphine under pain management ( Long acting 100mg  morphine TID, along with Percocet PRN), brought to the ER due to c/o malaise x 24 hrs. She had been coughing for a few days, mostly nonproductive. In the ER she was noted to have marked leukocytosis with WBC of 25K, Hgb of 11, and normal renal fnx. Her CXR showed vascular congestion and intersitial edema. She was also hypotensive with SBP of 80's. Her UA showed no evidence of infection and her lactic acid was not elevated. She did have an erythema of her left lower extremity. She was given 2.5 L of IVF but remained hypotensive. She was subsequently started on Levophed, with resulting BP of 104. She was lethargic, but arousable. Her ABG showed pH of 7.4, with pCO2 of 58, and pa Ox of 77. Dr Nelda Marseille of PCCM was consulted by the EDP as she was felt to be in septic shock, with leukocytosis, and hypotension, and was on Levophed.  It was recommended that levophed be stopped and she be admitted to [facility].   The following morning she was found with persitent hypotension despite adequate volume resuscitation (> 4L) so PCCM re-evaluated her and she was deemed an ICU level pt and they assumed care. Later it was discovered she took very large doses of morphine at home and had issues with narcotic withdrawal that required Precedex. She did require Levoped again. It was felt her hypotension was due to narcs and volume depletion since she had no definitive evidence of infection- she had been treated with empiric broad spectrum anbx's and stress dose steroids. She had also presented with a mild LE cellulitis (dopplers negative) but this was not severe enough to explain her hypotension.  Palliative Care was consulted. Code  status was clarified to DNR. During their evaluation disparate family dynamics were discovered but eventually all agreed to do what was best for the pt and now the focus is on comfort.   Subjective: Today pt primarily c/o bed making back hurt and not desiring to immediately return to bed  Assessment/Plan:  Acute respiratory failure with hypoxia due to: A) Acute encephalopathy B) HCAP vs CAP C) sedation from home narcs -stable on 2L -consideration given to underlying pneumonia since she presented with cough -CXRs have been negative for infiltrate although she did have bibasilar opacities on CXR 4/17- likely from hypoventilation so empiric anbx's were cont'd -family requested change all meds to PO for comfort since IVs increased pt anxiety -Per family's wishes no other treatment will be offered (i.e. No breathing treatments, pulmonary toilet, IV medication, tube feedings) -Narcotic overdose most likely attributed to acute respiratory failure.  Leukocytosis/? Cellulitis -presented with mild left LE cellulitis which quickly resolved- dopplers were negative -All labs were stopped per family's wishes  Chronic high dose opioid use -Narcotic medication has been DC'd - tx'd with Precedex while in the ICU re: narcotic withdrawal -suspect "tremors" seen this admission could be due to withdrawal -Psychiatry consult and recommended Depakote 500 mgTID - will transition over to PO today  -also utilizing Fentanyl patch and short acting morphine (much lower dose than pre admit)  Agitation secondary to bipolar vs narcotic abuse -See chronic high dose opioid use -Continue Ativan PRN  -Continue Seroquel - pt appears to have been  using narcotics to treat psych issues pre admit for multiple years  Pulmonary hypertension -focus now on comfort so would not pursue aggressive treatment at this time  Code Status: FULL Family Communication: no family present at time of exam Disposition Plan: Transfer to  floor- PT/OT eval to see if can dc to SNF-focus on comfort- MOST form completed (see Palliative notes)  Consultants: Dr. Berniece Andreas (psychiatry) NP Wadie Lessen (palliative care)  Procedure/Significant Events: PCXR -4/17 Persistent bibasilar patchy airspace opacities -4/17 Doppler LE >>> No DVTs, L popliteal fossa.  -4/18 Precedex required /4/20 off  -05/22/2013 echocardiogram - LVEF= 50% to 55%.  - Aortic valve: Mild regurgitation. - Tricuspid valve: Moderate regurgitation. - Pulmonary arteries: Systolic pressure was mildly increased. PA peak pressure: 52mm Hg (S).  Antibiotics: Levofloxacin PO 4/21>>  Zosyn 4/16 >>> stopped 4/18  Vancomycin 4/16 >>> stopped 4/18  DVT prophylaxis: None since focus is on comfort   Objective: VITAL SIGNS: Temp: 98.2 F (36.8 C) (04/22 1151) Temp src: Axillary (04/22 1151) BP: 138/79 mmHg (04/22 1151) Pulse Rate: 68 (04/22 1151) FIO2:   Intake/Output Summary (Last 24 hours) at 07/09/13 1301 Last data filed at 07/09/13 1000  Gross per 24 hour  Intake   1493 ml  Output    675 ml  Net    818 ml   Exam: General: No acute respiratory distress Lungs: Clear to auscultation bilaterally without wheezes or crackles Cardiovascular: Regular rate and rhythm without murmur gallop or rub normal S1 and S2 Abdomen: Nontender, nondistended, soft, bowel sounds positive, no rebound, no ascites, no appreciable mass Extremities: No significant cyanosis, clubbing, or edema bilateral lower extremities  Data Reviewed: Basic Metabolic Panel:  Recent Labs Lab 07/03/13 1923 07/04/13 0825 07/06/13 0415 07/07/13 0351  NA 129* 135* 140 142  K 3.3* 3.0* 3.0* 3.2*  CL 81* 93* 99 101  CO2 36* 30 25 27   GLUCOSE 122* 158* 105* 135*  BUN 34* 23 14 16   CREATININE 1.03 0.84 0.68 0.74  CALCIUM 10.1 8.6 9.5 9.3   Liver Function Tests:  Recent Labs Lab 07/03/13 1923 07/06/13 0415  AST 24 32  ALT 16 25  ALKPHOS 69 141*  BILITOT 0.4 0.4  PROT 6.9 6.6    ALBUMIN 3.0* 2.6*   CBC:  Recent Labs Lab 07/03/13 1923 07/04/13 0825 07/06/13 0415 07/07/13 0351  WBC 24.9* 18.3* 19.4* 11.4*  NEUTROABS 23.0*  --  15.8* 8.8*  HGB 11.1* 9.5* 11.1* 12.0  HCT 31.9* 28.1* 33.0* 35.3*  MCV 96.4 97.9 97.6 96.4  PLT 301 257 361 342   Cardiac Enzymes:  Recent Labs Lab 07/04/13 0155 07/04/13 0825 07/04/13 1304  TROPONINI <0.30 <0.30 <0.30   BNP (last 3 results)  Recent Labs  07/04/13 0036  PROBNP 1646.0*   CBG:  Recent Labs Lab 07/04/13 0105  GLUCAP 115*    Recent Results (from the past 240 hour(s))  CULTURE, BLOOD (ROUTINE X 2)     Status: None   Collection Time    07/03/13  7:25 PM      Result Value Ref Range Status   Specimen Description BLOOD RIGHT ARM   Final   Special Requests BOTTLES DRAWN AEROBIC ONLY 10CC   Final   Culture  Setup Time     Final   Value: 07/04/2013 01:20     Performed at [facility]   Culture     Final   Value:        BLOOD CULTURE RECEIVED NO GROWTH TO  DATE CULTURE WILL BE HELD FOR 5 DAYS BEFORE ISSUING A FINAL NEGATIVE REPORT     Performed at [facility]   Report Status PENDING   Incomplete  CULTURE, BLOOD (ROUTINE X 2)     Status: None   Collection Time    07/03/13  7:35 PM      Result Value Ref Range Status   Specimen Description BLOOD LEFT ARM   Final   Special Requests BOTTLES DRAWN AEROBIC AND ANAEROBIC 10CC   Final   Culture  Setup Time     Final   Value: 07/04/2013 01:20     Performed at [facility]   Culture     Final   Value:        BLOOD CULTURE RECEIVED NO GROWTH TO DATE CULTURE WILL BE HELD FOR 5 DAYS BEFORE ISSUING A FINAL NEGATIVE REPORT     Performed at [facility]   Report Status PENDING   Incomplete  MRSA PCR SCREENING     Status: None   Collection Time    07/04/13 12:58 AM      Result Value Ref Range Status   MRSA by PCR NEGATIVE  NEGATIVE Final   Comment:            The GeneXpert MRSA Assay (FDA     approved for NASAL specimens      only), is one component of a     comprehensive MRSA colonization     surveillance program. It is not     intended to diagnose MRSA     infection nor to guide or     monitor treatment for     MRSA infections.  URINE CULTURE     Status: None   Collection Time    07/04/13  1:10 AM      Result Value Ref Range Status   Specimen Description URINE, CATHETERIZED   Final   Special Requests Normal   Final   Culture  Setup Time     Final   Value: 07/04/2013 09:22     Performed at [facility] Count     Final   Value: NO GROWTH     Performed at [facility]   Culture     Final   Value: NO GROWTH     Performed at [facility]   Report Status 07/05/2013 FINAL   Final     Studies:  Recent x-ray studies have been reviewed in detail by the Attending Physician  Scheduled Meds:  Scheduled Meds: . antiseptic oral rinse  15 mL Mouth Rinse BID  . divalproex  500 mg Oral 3 times per day  . fentaNYL  50 mcg Transdermal Q72H  . levofloxacin  750 mg Oral Q24H  . QUEtiapine  25 mg Oral BID  . senna-docusate  1 tablet Oral BID  . sodium chloride  500 mL Intravenous Once  . sodium chloride  3 mL Intravenous Q12H  . thiamine  100 mg Oral Daily  . timolol  1 drop Both Eyes Daily    Time spent on care of this patient: 35 mins   Samella Parr , ANP   [facility] Office  [phone]  On-Call/Text Page:      [email or web address]      password [email or web address]  If 7PM-7AM, please contact night-coverage [email or web address] Password [email or web address] 07/09/2013, 1:01 PM   LOS: 6 days   I have personally examined this patient and reviewed the entire database.  I have reviewed the above note, made any necessary editorial changes, and agree with its content.  Cherene Altes, MD [facility]

## 2013-07-09 NOTE — Progress Notes (Signed)
Progress Note from the Palliative Medicine Team at [place]:  -patient is resting comfortably at this time, per nursing she had a good night  -discussed with her husband the present situation, he is pleased to see her so comfortable  -plan is to transfer to med-surg unit, initiate PT and hope for dc to SNF for rehabilitation with a full comfort appraoch   Objective: No Known Allergies Scheduled Meds: . antiseptic oral rinse  15 mL Mouth Rinse BID  . fentaNYL  50 mcg Transdermal Q72H  . levofloxacin  750 mg Oral Q24H  . QUEtiapine  25 mg Oral BID  . senna-docusate  1 tablet Oral BID  . sodium chloride  500 mL Intravenous Once  . sodium chloride  3 mL Intravenous Q12H  . thiamine  100 mg Oral Daily  . timolol  1 drop Both Eyes Daily  . valproate sodium  500 mg Intravenous 3 times per day   Continuous Infusions: . sodium chloride 10 mL/hr (07/09/13 0745)   PRN Meds:.acetaminophen, bisacodyl, diphenhydrAMINE, LORazepam, morphine  BP 89/52  Pulse 53  Temp(Src) 98.7 F (37.1 C) (Axillary)  Resp 15  Ht 5\' 2"  (1.575 m)  Wt 87.3 kg (192 lb 7.4 oz)  BMI 35.19 kg/m2  SpO2 100%   PPS: 30% at best  Pain Score:  appears comfortable  Intake/Output Summary (Last 24 hours) at 07/09/13 0953 Last data filed at 07/08/13 2114  Gross per 24 hour  Intake    276 ml  Output    675 ml  Net   -399 ml       Physical Exam:  General: chronically ill appearing, NAD HEENT:  Moist membranes Chest:   Decreased in bases  CVS: RRR Abdomen:soft NT +BS Ext: without edema Neuro:open eyes, verbalizes desire to get OOB Psych: calm but easily agitated with conversation or physical exam  Labs: CBC    Component Value Date/Time   WBC 11.4* 07/07/2013 0351   RBC 3.66* 07/07/2013 0351   HGB 12.0 07/07/2013 0351   HCT 35.3* 07/07/2013 0351   PLT 342 07/07/2013 0351   MCV 96.4 07/07/2013 0351   MCH 32.8 07/07/2013 0351   MCHC 34.0 07/07/2013 0351   RDW 13.9 07/07/2013 0351   LYMPHSABS  1.5 07/07/2013 0351   MONOABS 1.1* 07/07/2013 0351   EOSABS 0.0 07/07/2013 0351   BASOSABS 0.0 07/07/2013 0351    BMET    Component Value Date/Time   NA 142 07/07/2013 0351   K 3.2* 07/07/2013 0351   CL 101 07/07/2013 0351   CO2 27 07/07/2013 0351   GLUCOSE 135* 07/07/2013 0351   BUN 16 07/07/2013 0351   CREATININE 0.74 07/07/2013 0351   CALCIUM 9.3 07/07/2013 0351   GFRNONAA 78* 07/07/2013 0351   GFRAA >90 07/07/2013 0351    CMP     Component Value Date/Time   NA 142 07/07/2013 0351   K 3.2* 07/07/2013 0351   CL 101 07/07/2013 0351   CO2 27 07/07/2013 0351   GLUCOSE 135* 07/07/2013 0351   BUN 16 07/07/2013 0351   CREATININE 0.74 07/07/2013 0351   CALCIUM 9.3 07/07/2013 0351   PROT 6.6 07/06/2013 0415   ALBUMIN 2.6* 07/06/2013 0415   AST 32 07/06/2013 0415   ALT 25 07/06/2013 0415   ALKPHOS 141* 07/06/2013 0415   BILITOT 0.4 07/06/2013 0415   GFRNONAA 78* 07/07/2013 0351   GFRAA >90 07/07/2013 0351    Assessment and Plan: 1. Code Status:  DNR/DNI-comfort is main focus  of care Family desires NO life prolonging measures  2. Symptom Control:  Continue present medications, she is responding well! Pain Morphine IR 15 mg every 3 hrs prn Agitation- increase Depakote 500 mg po tid                   continueSeroquel 25 mg po bid Weakness: PT/OT as toelrated  Discussed with family and nursing that we will keep a journal of real time sleep vs agitation.  All will try to minimize interaction when patient is agitated in order to not escalate the situation.    Psycho/Social: Continued Emotional support offered to family and her husband at bedside.   Disposition: Likely dc to SNF for rehabilitation.  Discussed with Erin Hearing NP  Patient Documents Completed or Given: Document Given Completed  Advanced Directives Pkt    MOST yes   DNR    Gone from My Sight    Hard Choices      Time In Time Out Total Time Spent with Patient Total Overall Time  0800 0830 30  min  11min    Greater than 50%   of this time was spent counseling and coordinating care related to the above assessment and plan.  Wadie Lessen NP  Palliative Medicine Team Team Phone # [phone] Pager [phone]

## 2013-07-09 NOTE — Progress Notes (Signed)
Clinical Social Work Department CLINICAL SOCIAL WORK PLACEMENT NOTE 07/09/2013  Patient:  Monique Russo, Monique Russo  Account Number:  [id number] [place] date:  07/03/2013  Clinical Social Worker:  Ky Barban, Latanya Presser  Date/time:  07/09/2013 03:56 PM  Clinical Social Work is seeking post-discharge placement for this patient at the following level of care:   [place]   (*CSW will update this form in Epic as items are completed)   N/A-clinicals sent to all SNFs in [facility]  Patient/family provided with [place] Department of Clinical Social Work's list of facilities offering this level of care within the geographic area requested by the patient (or if unable, by the patient's family).  07/09/2013  Patient/family informed of their freedom to choose among providers that offer the needed level of care, that participate in Medicare, Medicaid or managed care program needed by the patient, have an available bed and are willing to accept the patient.  N/A-clinicals not sent to this facility  Patient/family informed of [facility]' ownership interest in [facility], as well as of the fact that they are under no obligation to receive care at this facility.  PASARR submitted to EDS on 07/09/2013 PASARR number received from EDS on 07/09/2013  FL2 transmitted to all facilities in geographic area requested by pt/family on  07/09/2013 FL2 transmitted to all facilities within larger geographic area on   Patient informed that his/her managed care company has contracts with or will negotiate with  certain facilities, including the following:     Patient/family informed of bed offers received:   Patient chooses bed at  Physician recommends and patient chooses bed at    Patient to be transferred to  on   Patient to be transferred to facility by   The following physician request were entered in Epic:   Additional Comments:  Ky Barban, MSW, [place] Social  Worker [phone]

## 2013-07-09 NOTE — Progress Notes (Signed)
Pt resting in bed, no distress noted, turned and repositioned per comfort. No bowel movement noted. Family remains at bedside. Will continue to monitor.

## 2013-07-09 NOTE — Evaluation (Signed)
Physical Therapy Evaluation Patient Details Name: Monique Russo MRN: [id number] DOB: 06-01-1930 Today's Date: 07/09/2013   History of Present Illness  Adm 07/03/13 in septic shock, with leukocytosis, and hypotension. PMHx- bipolar d/o (?additional psych h/o per family), arthritis, chronic pain syndrome on high dose morphine under pain management.   Clinical Impression  Pt admitted with sepsis. Pt currently with functional limitations due to the deficits listed below (see PT Problem List).  Pt will benefit from skilled PT to increase their independence and safety with mobility to allow discharge to the venue listed below.       Follow Up Recommendations SNF;Supervision/Assistance - 24 hour    Equipment Recommendations  None recommended by PT    Recommendations for Other Services       Precautions / Restrictions Precautions Precautions: Fall;Other (comment) Precaution Comments: strong psych h/o per chart with periods of extreme agitation Restrictions Weight Bearing Restrictions: No      Mobility  Bed Mobility Overal bed mobility: Needs Assistance Bed Mobility: Rolling;Sidelying to Sit Rolling: Mod assist Sidelying to sit: Mod assist;HOB elevated       General bed mobility comments: using rail; cues for sequence, assist due to weakness  Transfers Overall transfer level: Needs assistance Equipment used: None Transfers: Sit to/from [employer] Transfers Sit to Stand: Max assist;+2 physical assistance Stand pivot transfers: Max assist;+2 physical assistance       General transfer comment: stood x1 for 30 seconds with pt unable to fully extend knees, unable to weight shift and advance either LE; returned to sit with feet positioned for stand-pivot and pivoted to recliner; 3rd stand from recliner to adjust pads  Ambulation/Gait                Stairs            Wheelchair Mobility    Modified Rankin (Stroke Patients Only)       Balance Overall  balance assessment: Needs assistance Sitting-balance support: Bilateral upper extremity supported;Feet supported Sitting balance-Leahy Scale: Poor     Standing balance support: Bilateral upper extremity supported Standing balance-Leahy Scale: Zero                               Pertinent Vitals/Pain On 2L O2 throughout, no apparent distress Reported chronic arthitis pain (especially Lt knee)    Home Living Family/patient expects to be discharged to:: Skilled nursing facility                 Additional Comments: per chart, husband states he cannot care for pt in current state    Prior Function Level of Independence: Independent with assistive device(s)         Comments: per daughter, walking household distances with RW 2 weeks ago     Hand Dominance        Extremity/Trunk Assessment   Upper Extremity Assessment: Generalized weakness           Lower Extremity Assessment: Generalized weakness      Cervical / Trunk Assessment: Other exceptions  Communication   Communication: No difficulties  Cognition Arousal/Alertness: Awake/alert Behavior During Therapy: Restless (repeats phrases over and over "Want to get up") Overall Cognitive Status: History of cognitive impairments - at baseline                      General Comments General comments (skin integrity, edema, etc.): Spoke with Stanton Kidney, Palliative Care NP and  pt currently is a Palliative care pt (not Hospice)    Exercises        Assessment/Plan    PT Assessment Patient needs continued PT services  PT Diagnosis Generalized weakness;Difficulty walking   PT Problem List Decreased strength;Decreased activity tolerance;Decreased balance;Decreased mobility;Decreased cognition;Decreased knowledge of use of DME;Obesity  PT Treatment Interventions DME instruction;Gait training;Functional mobility training;Therapeutic activities;Therapeutic exercise;Balance training;Patient/family  education   PT Goals (Current goals can be found in the Care Plan section) Acute Rehab PT Goals Patient Stated Goal: get out of bed and get stronger "I've been in bed too long" PT Goal Formulation: With patient Time For Goal Achievement: 07/23/13 Potential to Achieve Goals: Fair    Frequency Min 2X/week   Barriers to discharge Decreased caregiver support      Co-evaluation               End of Session Equipment Utilized During Treatment: Gait belt;Oxygen Activity Tolerance: Patient limited by fatigue Patient left: in chair;with call bell/phone within reach;with family/visitor present Nurse Communication: Mobility status;Need for lift equipment         Time: [id number] PT Time Calculation (min): 23 min   Charges:   PT Evaluation $Initial PT Evaluation Tier I: 1 Procedure PT Treatments $Therapeutic Activity: 8-22 mins   PT G CodesJeanie Cooks Fredia Chittenden 07-28-13, 10:58 AM Pager [phone]

## 2013-07-09 NOTE — Progress Notes (Signed)
Clinical Social Work Department BRIEF PSYCHOSOCIAL ASSESSMENT 07/09/2013  Patient:  Monique Russo, Monique Russo     Account Number:  [id number]     Admit date:  07/03/2013  Clinical Social Worker:  Freeman Caldron  Date/Time:  07/09/2013 03:53 PM  Referred by:  Physician  Date Referred:  07/09/2013 Referred for  SNF Placement   Other Referral:   Interview type:  Family Other interview type:   Pt's sister Alinda Sierras at bedside.    PSYCHOSOCIAL DATA Living Status:  FAMILY Admitted from facility:   Level of care:   Primary support name:  Kailana Benninger [phone]) Primary support relationship to patient:  SPOUSE Degree of support available:   Good--pt lives with husband, has support from family.    CURRENT CONCERNS Current Concerns  Post-Acute Placement   Other Concerns:    SOCIAL WORK ASSESSMENT / PLAN CSW spoke with pt about SNF recommendation. Pt's sister at bedside and understanding of CSW role; pt slightly confused and engaged in conversation concerning family support. CSW made referrals to all SNFs in [facility], and will follow up with pt's husband for choice facility.   Assessment/plan status:  Psychosocial Support/Ongoing Assessment of Needs Other assessment/ plan:   Information/referral to community resources:   SNF    PATIENT'S/FAMILY'S RESPONSE TO PLAN OF CARE: Good--pt confused but participated in conversation. Sister understanding of CSW role, and referrals were made.       Ky Barban, MSW, [facility] Clinical Social Worker [phone]

## 2013-07-09 NOTE — Evaluation (Signed)
Occupational Therapy Evaluation Patient Details Name: Monique Russo MRN: [id number] DOB: 19-Jan-1931 Today's Date: 07/09/2013    History of Present Illness Adm 07/03/13 in septic shock, with leukocytosis, and hypotension. PMHx- bipolar d/o (?additional psych h/o per family), arthritis, chronic pain syndrome on high dose morphine under pain management.    Clinical Impression   Pt alert and participatory in session with sister bedside.  Pt is given total assist for all ADL by her family.  Instructed sister to encourage pt to self feed and groom rather than immediately helping her when it may not be necessary.  Pt is dependent in bed mobility and demonstrates poor sitting balance at EOB.  Pt has baseline shoulder limitations, intermittent tremor, and chronic back pain, but was performing ADL and mobility at a modified independent to supervision level. Will follow.     Follow Up Recommendations  SNF;Supervision/Assistance - 24 hour    Equipment Recommendations       Recommendations for Other Services       Precautions / Restrictions Precautions Precautions: Fall;Other (comment) (arthritis in shoulders) Precaution Comments: strong psych h/o per chart with periods of extreme agitation Restrictions Weight Bearing Restrictions: No      Mobility Bed Mobility Overal bed mobility: Needs Assistance Bed Mobility: Rolling;Sidelying to Sit;Sit to Supine Rolling: Mod assist Sidelying to sit: Mod assist;HOB elevated   Sit to supine: Max assist   General bed mobility comments: using rail; cues for sequence, assist due to weakness  Transfers Overall transfer level:  (not assessed)                    Balance     Sitting balance-Leahy Scale: Poor                                      ADL Overall ADL's : Needs assistance/impaired                                       General ADL Comments: Pt is requiring total assist for all ADL.  Could likely  do more, but declining stating," I'll feed myself when I get home."       Vision                     Perception     Praxis      Pertinent Vitals/Pain Chronic back pain, premedicated, repositioned, VSS     Hand Dominance Right   Extremity/Trunk Assessment Upper Extremity Assessment Upper Extremity Assessment: RUE deficits/detail;LUE deficits/detail RUE Deficits / Details: longstanding shoulder limitations, arthritic changes in hand, tremor intermittently at baseline RUE Coordination: decreased gross motor;decreased fine motor LUE Deficits / Details: hx of shoulder limitations, arthritic changes in hand LUE Coordination: decreased gross motor   Lower Extremity Assessment Lower Extremity Assessment: Defer to PT evaluation       Communication Communication Communication: No difficulties   Cognition Arousal/Alertness: Awake/alert Behavior During Therapy: Anxious (at times tearful) Overall Cognitive Status: History of cognitive impairments - at baseline                     General Comments       Exercises       Shoulder Instructions      Home Living Family/patient expects to be discharged to:: Skilled nursing facility  Living Arrangements: Spouse/significant other                               Additional Comments: per chart, husband states he cannot care for pt in current state      Prior Functioning/Environment Level of Independence: Independent with assistive device(s)        Comments: per daughter, walking household distances with RW 2 weeks ago    OT Diagnosis: Generalized weakness;Cognitive deficits (chronic pain)   OT Problem List: Decreased strength;Decreased activity tolerance;Decreased range of motion;Impaired balance (sitting and/or standing);Decreased coordination;Decreased cognition;Decreased knowledge of use of DME or AE;Impaired tone;Obesity;Impaired UE functional use;Pain   OT Treatment/Interventions: Self-care/ADL  training;DME and/or AE instruction;Therapeutic activities;Patient/family education;Balance training    OT Goals(Current goals can be found in the care plan section) Acute Rehab OT Goals Patient Stated Goal: get out of bed and get stronger "I've been in bed too long" OT Goal Formulation: With patient/family Time For Goal Achievement: 07/23/13 Potential to Achieve Goals: Good ADL Goals Pt Will Perform Eating: with supervision;sitting Pt Will Perform Grooming: with supervision;sitting Pt Will Transfer to Toilet: with mod assist;stand pivot transfer;bedside commode Additional ADL Goal #1: Pt will sit EOB unsupported with supervision x 10 minutes as precursor to ADL. Additional ADL Goal #2: Pt will perform bed mobility with min assist in preparation for ADL at EOB.  OT Frequency: Min 2X/week   Barriers to D/C:            Co-evaluation              End of Session    Activity Tolerance: Patient limited by fatigue Patient left: in bed;with call bell/phone within reach;with family/visitor present   Time: 1540-0867 OT Time Calculation (min): 38 min Charges:  OT General Charges $OT Visit: 1 Procedure OT Evaluation $Initial OT Evaluation Tier I: 1 Procedure OT Treatments $Self Care/Home Management : 8-22 mins G-Codes:    Monique Russo Monique Russo 07/19/2013, 3:00 PM [phone]

## 2013-07-09 NOTE — Progress Notes (Signed)
Pt resting in bed, no distress noted. No involuntary extremity movement noted. Pt has had no bowel movements during this shift. Family remains at bedside.

## 2013-07-09 NOTE — Progress Notes (Signed)
Called report to [employer] on [employer]. Pt transferred per bed with all belongings. Has triple lumen IV. Family aware of room change.

## 2013-07-10 LAB — CULTURE, BLOOD (ROUTINE X 2)
Culture: NO GROWTH
Culture: NO GROWTH

## 2013-07-10 MED ORDER — ACETAMINOPHEN 325 MG PO TABS: 650.0000 mg | ORAL_TABLET | Freq: Four times a day (QID) | ORAL | Status: DC | PRN

## 2013-07-10 MED ORDER — LEVOFLOXACIN 750 MG PO TABS: 750.0000 mg | ORAL_TABLET | ORAL | Status: DC

## 2013-07-10 MED ORDER — THIAMINE HCL 100 MG PO TABS: 100.0000 mg | ORAL_TABLET | Freq: Every day | ORAL | Status: DC

## 2013-07-10 MED ORDER — TEMAZEPAM 30 MG PO CAPS: 30.0000 mg | ORAL_CAPSULE | Freq: Every evening | ORAL | Status: DC | PRN

## 2013-07-10 MED ORDER — FENTANYL 50 MCG/HR TD PT72: 50.0000 ug | MEDICATED_PATCH | TRANSDERMAL | Status: DC

## 2013-07-10 MED ORDER — OXYCODONE-ACETAMINOPHEN 10-325 MG PO TABS: 1.0000 | ORAL_TABLET | Freq: Three times a day (TID) | ORAL | Status: DC

## 2013-07-10 MED ORDER — DIVALPROEX SODIUM 500 MG PO DR TAB: 500.0000 mg | DELAYED_RELEASE_TABLET | Freq: Three times a day (TID) | ORAL | Status: DC

## 2013-07-10 MED ORDER — LORAZEPAM 1 MG PO TABS: 1.0000 mg | ORAL_TABLET | ORAL | Status: DC | PRN

## 2013-07-10 MED ORDER — DIPHENHYDRAMINE HCL 25 MG PO CAPS
50.0000 mg | ORAL_CAPSULE | Freq: Once | ORAL | Status: AC
  Administered 2013-07-10: 50 mg via ORAL
  Filled 2013-07-10: qty 2

## 2013-07-10 MED ORDER — SENNOSIDES-DOCUSATE SODIUM 8.6-50 MG PO TABS: 1.0000 | ORAL_TABLET | Freq: Two times a day (BID) | ORAL | Status: DC

## 2013-07-10 MED ORDER — MORPHINE SULFATE 15 MG PO TABS: 15.0000 mg | ORAL_TABLET | ORAL | Status: DC | PRN

## 2013-07-10 MED ORDER — QUETIAPINE FUMARATE 25 MG PO TABS
25.0000 mg | ORAL_TABLET | Freq: Two times a day (BID) | ORAL | Status: DC
Start: 2013-07-10 — End: 2013-11-21

## 2013-07-10 MED ORDER — MORPHINE SULFATE ER 100 MG PO CP24: 100.0000 mg | ORAL_CAPSULE | Freq: Three times a day (TID) | ORAL | Status: DC

## 2013-07-10 MED ORDER — BISACODYL 10 MG RE SUPP: 10.0000 mg | Freq: Every day | RECTAL | Status: AC | PRN

## 2013-07-10 MED ORDER — BIOTENE DRY MOUTH MT LIQD: 15.0000 mL | Freq: Two times a day (BID) | OROMUCOSAL | Status: DC

## 2013-07-10 NOTE — Progress Notes (Signed)
Patient evaluated for community based chronic disease management services with [facility] as a benefit of patient's [employer]. Spoke with patient and sisters at bedside to explain [facility] services.  Services have been accepted by patient verbally.  She would like her husband to review the information.  Patient has received a [place] consult for medication management.  She uses [employer] on [address] [employer] and [employer] from [place].  Patient will receive a post discharge transition of care call and will be evaluated for monthly home visits for assessments and disease process education.  Left contact information and [facility] literature at bedside. Made Inpatient Case Manager aware that [facility] following. Of note, [facility] Management services does not replace or interfere with any services that are arranged by inpatient case management or social work.  For additional questions or referrals please contact Corliss Blacker BSN RN [facility] Liaison at [phone].

## 2013-07-10 NOTE — Discharge Summary (Signed)
Physician Discharge Summary  Monique Russo QPY:[id number] DOB: [birth year] DOA: 07/03/2013  PCP: Haywood Pao, MD  Admit date: 07/03/2013 Discharge date: 07/10/2013  Time spent: 60  minutes  Recommendations for Outpatient Follow-up:  1. Patient is DNR / Comfort Measures Only - Please see MOST form for scope of treatment 2. D/C to [facility].  Will receive PT / OT 3. CBC / BMET on 4/24. 4.   Kadian and percocet have been discontinued.   Fentanyl patch and Morphine 15 mg q 3 hours prn pain are in use.  Discharge Diagnoses:  Principal Problem:   Septic shock Active Problems:   Narcotic overdose   CHF (congestive heart failure)   Back pain   Sepsis   DNR (do not resuscitate)   Comfort measures only status   Palliative care encounter   Agitation   Leukocytosis, unspecified   Pulmonary hypertension   Weakness generalized   Discharge Condition: stable   Diet recommendation: 2 gram sodium diet  Filed Weights   07/03/13 1925 07/04/13 0100  Weight: 83.915 kg (185 lb) 87.3 kg (192 lb 7.4 oz)    History of present illness:  Monique Russo is an 80 y.o. female with hx of arthritis, chronic pain syndrome on high dose morphine under pain management ( Long acting 100mg  morphine TID, along with Percocet PRN), brought to the ER feeling malaise for one day. She has been coughing for a few days, mostly nonproductive. In the ER, work up included marked leukocytosis with WBC of 25K, Hb of 11 grams per dL, and normal renal Fx test. Her CXR showed vascular congestion and intersitial edema. She was also hypotensive with SBP of 80's. She had erythema of her left lower extremity   Hospital Course:  Acute respiratory failure with hypoxia due to community acquired pneumonia and over sedation from narcotic use. -Patient was initially hypotensive with leukocytosis and felt to be in septic shock.  She required admission to the ICU for pressors. She improved and was transferred to the  Hospitalists Service. -She received broad spectrum antibiotics - initially Primaxin.  She progressed to Levaquin and will be discharged with 4 more doses. -Stable on 2L of oxygen via nasal canula.  Was not previously on oxygen at home.  Wean oxygen over time. -Family requested change all meds to PO for comfort since IVs increased pt anxiety  -Narcotic overdose most likely attributed to acute respiratory failure.    Leukocytosis/? Cellulitis  -Blood cultures and Urine culture were negative. -presented with mild left LE cellulitis which quickly resolved- dopplers were negative  -All labs were stopped per family's wishes   Chronic high dose opioid use  -Narcotic medication was DC'd - tx'd with Precedex while in the ICU re: narcotic withdrawal  -Psychiatry consult and recommended Depakote 500 mgTID - will transition over to PO today  -Kadian and percocet have been discontinued.  Patient will be discharged on Fentanyl Patch and Morphine 15 mg q 3 hours prn pain.  Agitation secondary to bipolar vs narcotic abuse  -See chronic high dose opioid use  -Continue Seroquel - pt appears to have been using narcotics to treat psych issues pre admit for multiple years   Pulmonary hypertension  -focus now on comfort so would not pursue aggressive treatment at this time   Palliative medicine  -Medical Scope of Treatment for has been completed and the patient is Comfort Measures Only. 1. Code Status: DNR/DNI-comfort is main focus of care Family desires NO life prolonging measures  2.  Symptom Control: Continue present medications, she is responding well! Pain Morphine IR 15 mg every 3 hrs prn  Agitation- increase Depakote 500 mg po tid  continueSeroquel 25 mg po bid  Weakness: PT/OT as toelrated   Procedures: PCXR  Central Line Placement.  4/17 Doppler LE >>> No DVTs, L popliteal fossa.  4/18 Precedex required /4/20 off   -05/22/2013 echocardiogram  - LVEF= 50% to 55%.  - Aortic valve: Mild  regurgitation. - Tricuspid valve: Moderate regurgitation. - Pulmonary arteries: Systolic pressure was mildly increased. PA peak pressure: 84mm Hg (S).  Antibiotics:  Levofloxacin PO 4/21>> 4/27. Zosyn 4/16 >>> stopped 4/18  Vancomycin 4/16 >>> stopped 4/18   Consultations:  Psychiatry  Palliative medicine  Discharge Exam: Filed Vitals:   07/10/13 0443  BP: 103/67  Pulse: 70  Temp: 98.1 F (36.7 C)  Resp: 18   General: NAD, Alert, Orientated, Sitting in recliner. Lungs: + Crackles with occ. Non productive cough.  Cardiovascular: Regular rate and rhythm without murmur gallop or rub normal S1 and S2  Abdomen: obese, Nontender, nondistended, soft, bowel sounds positive, no rebound, no ascites, no appreciable mass  Extremities: Bilateral lower extremity edema (chronic), warm, no cyanosis.    Discharge Instructions      Discharge Orders   Future Orders Complete By Expires   Diet - low sodium heart healthy  As directed    Increase activity slowly  As directed        Medication List    STOP taking these medications       furosemide 80 MG tablet  Commonly known as:  LASIX     morphine 100 MG 24 hr capsule  Commonly known as:  KADIAN  Replaced by:  morphine 15 MG tablet     oxyCODONE-acetaminophen 10-325 MG per tablet  Commonly known as:  PERCOCET     potassium chloride 20 MEQ packet  Commonly known as:  KLOR-CON      TAKE these medications       acetaminophen 325 MG tablet  Commonly known as:  TYLENOL  Take 2 tablets (650 mg total) by mouth every 6 (six) hours as needed for mild pain or headache.     antiseptic oral rinse Liqd  15 mLs by Mouth Rinse route 2 (two) times daily.     bisacodyl 10 MG suppository  Commonly known as:  DULCOLAX  Place 1 suppository (10 mg total) rectally daily as needed for moderate constipation (May repeat times one).     divalproex 500 MG DR tablet  Commonly known as:  DEPAKOTE  Take 1 tablet (500 mg total) by mouth every  8 (eight) hours.     fentaNYL 50 MCG/HR  Commonly known as:  DURAGESIC - dosed mcg/hr  Place 1 patch (50 mcg total) onto the skin every 3 (three) days.     levofloxacin 750 MG tablet  Commonly known as:  LEVAQUIN  Take 1 tablet (750 mg total) by mouth daily.     LORazepam 1 MG tablet  Commonly known as:  ATIVAN  Take 1 tablet (1 mg total) by mouth every 4 (four) hours as needed for anxiety.     methocarbamol 750 MG tablet  Commonly known as:  ROBAXIN  Take 750 mg by mouth 3 (three) times daily.     morphine 15 MG tablet  Commonly known as:  MSIR  Take 1 tablet (15 mg total) by mouth every 3 (three) hours as needed for moderate pain.     QUEtiapine  25 MG tablet  Commonly known as:  SEROQUEL  Take 1 tablet (25 mg total) by mouth 2 (two) times daily.     senna-docusate 8.6-50 MG per tablet  Commonly known as:  Senokot-S  Take 1 tablet by mouth 2 (two) times daily.     temazepam 30 MG capsule  Commonly known as:  RESTORIL  Take 1 capsule (30 mg total) by mouth at bedtime as needed for sleep.     thiamine 100 MG tablet  Take 1 tablet (100 mg total) by mouth daily.     timolol 0.5 % ophthalmic solution  Commonly known as:  BETIMOL  Place 1 drop into both eyes daily.       No Known Allergies    The results of significant diagnostics from this hospitalization (including imaging, microbiology, ancillary and laboratory) are listed below for reference.    Significant Diagnostic Studies: Dg Chest Port 1 View  07/04/2013   CLINICAL DATA:  Central line placement  EXAM: PORTABLE CHEST - 1 VIEW  COMPARISON:  DG CHEST 1V PORT dated 07/04/2013  FINDINGS: Left IJ central line tip is partly obscured by overlying cardiac leads. The tip appears to be located at least the level of the brachiocephalic/SVC junction. Patient is rotated to the right. Left shoulder arthroplasty remains in place. Cardiomegaly with unfolding of the aorta is reidentified. Patchy bibasilar airspace opacities are  again identified. Right glenohumeral joint degenerative change noted. No pneumothorax.  IMPRESSION: Poor visualization of the tip of the left IJ approach central line, at least at the level of the brachiocephalic/ SV junction but partly obscured by Cardiac leads.  Persistent bibasilar patchy airspace opacities.   Electronically Signed   By: Conchita Paris M.D.   On: 07/04/2013 12:48   Dg Chest Port 1 View  07/04/2013   CLINICAL DATA:  Followup effusion.  EXAM: PORTABLE CHEST - 1 VIEW  COMPARISON:  07/03/2013  FINDINGS: Heart is enlarged. There are central changes of mild pulmonary edema. More focal passed developed at the left lung base consistent with infectious. Less likely the findings could be related to asymmetric edema. There is mild elevation right hemidiaphragm, stable in appearance. Left numerous surgery  IMPRESSION: 1. Persistent cardiomegaly and mild edema. 2. Developing left infiltrate   Electronically Signed   By: Shon Hale M.D.   On: 07/04/2013 08:13   Dg Chest Port 1 View  (if Code Sepsis Called)  07/03/2013   CLINICAL DATA:  Sepsis  EXAM: PORTABLE CHEST - 1 VIEW  COMPARISON:  DG CHEST 2 VIEW dated 04/13/2010; DG SHOULDER*L*PORT dated 04/19/2010  FINDINGS: The heart remains enlarged. Vascular congestion and diffuse interstitial edema are present. Right hemidiaphragm remains elevated. Spinal stimulator has been removed. Left total shoulder arthroplasty has been placed. It is stable compared 04/19/2010. No pneumothorax. Mild cardiomegaly.  IMPRESSION: CHF with interstitial edema and vascular congestion.   Electronically Signed   By: Maryclare Bean M.D.   On: 07/03/2013 20:12    Microbiology: Recent Results (from the past 240 hour(s))  CULTURE, BLOOD (ROUTINE X 2)     Status: None   Collection Time    07/03/13  7:25 PM      Result Value Ref Range Status   Specimen Description BLOOD RIGHT ARM   Final   Special Requests BOTTLES DRAWN AEROBIC ONLY 10CC   Final   Culture  Setup Time     Final    Value: 07/04/2013 01:20     Performed at [facility]  Culture     Final   Value: NO GROWTH 5 DAYS     Performed at [facility]   Report Status 07/10/2013 FINAL   Final  CULTURE, BLOOD (ROUTINE X 2)     Status: None   Collection Time    07/03/13  7:35 PM      Result Value Ref Range Status   Specimen Description BLOOD LEFT ARM   Final   Special Requests BOTTLES DRAWN AEROBIC AND ANAEROBIC 10CC   Final   Culture  Setup Time     Final   Value: 07/04/2013 01:20     Performed at [facility]   Culture     Final   Value: NO GROWTH 5 DAYS     Performed at [facility]   Report Status 07/10/2013 FINAL   Final  MRSA PCR SCREENING     Status: None   Collection Time    07/04/13 12:58 AM      Result Value Ref Range Status   MRSA by PCR NEGATIVE  NEGATIVE Final   Comment:            The GeneXpert MRSA Assay (FDA     approved for NASAL specimens     only), is one component of a     comprehensive MRSA colonization     surveillance program. It is not     intended to diagnose MRSA     infection nor to guide or     monitor treatment for     MRSA infections.  URINE CULTURE     Status: None   Collection Time    07/04/13  1:10 AM      Result Value Ref Range Status   Specimen Description URINE, CATHETERIZED   Final   Special Requests Normal   Final   Culture  Setup Time     Final   Value: 07/04/2013 09:22     Performed at [facility]     Final   Value: NO GROWTH     Performed at [facility]   Culture     Final   Value: NO GROWTH     Performed at [facility]   Report Status 07/05/2013 FINAL   Final     Labs: Basic Metabolic Panel:  Recent Labs Lab 07/03/13 1923 07/04/13 0825 07/06/13 0415 07/07/13 0351  NA 129* 135* 140 142  K 3.3* 3.0* 3.0* 3.2*  CL 81* 93* 99 101  CO2 36* 30 25 27   GLUCOSE 122* 158* 105* 135*  BUN 34* 23 14 16   CREATININE 1.03 0.84 0.68 0.74  CALCIUM 10.1 8.6 9.5 9.3   Liver  Function Tests:  Recent Labs Lab 07/03/13 1923 07/06/13 0415  AST 24 32  ALT 16 25  ALKPHOS 69 141*  BILITOT 0.4 0.4  PROT 6.9 6.6  ALBUMIN 3.0* 2.6*  CBC:  Recent Labs Lab 07/03/13 1923 07/04/13 0825 07/06/13 0415 07/07/13 0351  WBC 24.9* 18.3* 19.4* 11.4*  NEUTROABS 23.0*  --  15.8* 8.8*  HGB 11.1* 9.5* 11.1* 12.0  HCT 31.9* 28.1* 33.0* 35.3*  MCV 96.4 97.9 97.6 96.4  PLT 301 257 361 342   Cardiac Enzymes:  Recent Labs Lab 07/04/13 0155 07/04/13 0825 07/04/13 1304  TROPONINI <0.30 <0.30 <0.30   BNP: BNP (last 3 results)  Recent Labs  07/04/13 0036  PROBNP 1646.0*   CBG:  Recent Labs Lab 07/04/13 0105  GLUCAP 115*  SignedGustavus Bryant, PA-C] [phone]  [facility] 07/10/2013, 1:42 PM  Attending Seen and examined, agree with the above assessment and plan. Stable for discharge to SNF today.  Nena Alexander MD

## 2013-07-10 NOTE — Clinical Social Work Placement (Signed)
Clinical Social Work Department CLINICAL SOCIAL WORK PLACEMENT NOTE 07/10/2013  Patient:  Monique Russo, Monique Russo  Account Number:  [id number] [place] date:  07/03/2013  Clinical Social Worker:  Ky Barban, Latanya Presser  Date/time:  07/09/2013 03:56 PM  Clinical Social Work is seeking post-discharge placement for this patient at the following level of care:   SKILLED NURSING   (*CSW will update this form in Epic as items are completed)     Patient/family provided with [place] Department of Clinical Social Work's list of facilities offering this level of care within the geographic area requested by the patient (or if unable, by the patient's family).    Patient/family informed of their freedom to choose among providers that offer the needed level of care, that participate in Medicare, Medicaid or managed care program needed by the patient, have an available bed and are willing to accept the patient.    Patient/family informed of [facility]' ownership interest in [facility], as well as of the fact that they are under no obligation to receive care at this facility.  PASARR submitted to EDS on 07/09/2013 PASARR number received from EDS on 07/09/2013  FL2 transmitted to all facilities in geographic area requested by pt/family on  07/09/2013 FL2 transmitted to all facilities within larger geographic area on   Patient informed that his/her managed care company has contracts with or will negotiate with  certain facilities, including the following:     Patient/family informed of bed offers received:  07/10/2013 Patient chooses bed at [facility] Physician recommends and patient chooses bed at    Patient to be transferred to [facility] on  07/10/2013 Patient to be transferred to facility by Ambulance  The following physician request were entered in Epic:   Additional Comments: Per MD patient ready to DC to [facility], patient's  family, and facility aware of DC. Rn given number for report. DC packet on chart. Ambulance transport requested for patient for 3:30pm.    Liz Beach MSW, [place], [phone]

## 2013-07-10 NOTE — Progress Notes (Signed)
Pt discharged to SNF per orders. Pt and family with no questions upon discharge. Report called to facility. Pt stable upon transfer via ambulance.

## 2013-11-06 ENCOUNTER — Other Ambulatory Visit: Payer: Self-pay | Admitting: Orthopedic Surgery

## 2013-11-21 ENCOUNTER — Encounter (HOSPITAL_COMMUNITY): Payer: Self-pay | Admitting: Pharmacy Technician

## 2013-11-26 ENCOUNTER — Encounter (HOSPITAL_COMMUNITY)
Admission: RE | Admit: 2013-11-26 | Discharge: 2013-11-26 | Disposition: A | Discharge status: Home / Self Care | Payer: Medicare HMO | Source: Ambulatory Visit | Attending: Orthopedic Surgery | Admitting: Orthopedic Surgery

## 2013-11-26 ENCOUNTER — Ambulatory Visit (HOSPITAL_COMMUNITY)
Admission: RE | Admit: 2013-11-26 | Discharge: 2013-11-26 | Disposition: A | Discharge status: Home / Self Care | Payer: Medicare HMO | Source: Ambulatory Visit | Attending: Orthopedic Surgery | Admitting: Orthopedic Surgery

## 2013-11-26 ENCOUNTER — Encounter (HOSPITAL_COMMUNITY): Payer: Self-pay

## 2013-11-26 DIAGNOSIS — Z01818 Encounter for other preprocedural examination: Secondary | ICD-10-CM | POA: Insufficient documentation

## 2013-11-26 DIAGNOSIS — K219 Gastro-esophageal reflux disease without esophagitis: Secondary | ICD-10-CM | POA: Diagnosis not present

## 2013-11-26 HISTORY — DX: Depression, unspecified: F32.A

## 2013-11-26 HISTORY — DX: Pneumonia, unspecified organism: J18.9

## 2013-11-26 HISTORY — DX: Heart failure, unspecified: I50.9

## 2013-11-26 HISTORY — DX: Major depressive disorder, single episode, unspecified: F32.9

## 2013-11-26 HISTORY — DX: Gastro-esophageal reflux disease without esophagitis: K21.9

## 2013-11-26 LAB — COMPREHENSIVE METABOLIC PANEL
ALK PHOS: 85 U/L (ref 39–117)
ALT: 17 U/L (ref 0–35)
ANION GAP: 13 (ref 5–15)
AST: 23 U/L (ref 0–37)
Albumin: 3.5 g/dL (ref 3.5–5.2)
BILIRUBIN TOTAL: 0.2 mg/dL — AB (ref 0.3–1.2)
BUN: 25 mg/dL — AB (ref 6–23)
CHLORIDE: 89 meq/L — AB (ref 96–112)
CO2: 30 meq/L (ref 19–32)
Calcium: 10.1 mg/dL (ref 8.4–10.5)
Creatinine, Ser: 0.83 mg/dL (ref 0.50–1.10)
GFR, EST AFRICAN AMERICAN: 75 mL/min — AB (ref >90)
GFR, EST NON AFRICAN AMERICAN: 64 mL/min — AB (ref >90)
GLUCOSE: 104 mg/dL — AB (ref 70–99)
POTASSIUM: 4.5 meq/L (ref 3.7–5.3)
SODIUM: 132 meq/L — AB (ref 137–147)
Total Protein: 7.5 g/dL (ref 6.0–8.3)

## 2013-11-26 LAB — CBC WITH DIFFERENTIAL/PLATELET
Basophils Absolute: 0 10*3/uL (ref 0.0–0.1)
Basophils Relative: 0 % (ref 0–1)
Eosinophils Absolute: 0.2 10*3/uL (ref 0.0–0.7)
Eosinophils Relative: 2 % (ref 0–5)
HCT: 37.1 % (ref 36.0–46.0)
Hemoglobin: 12.1 g/dL (ref 12.0–15.0)
LYMPHS ABS: 2.1 10*3/uL (ref 0.7–4.0)
LYMPHS PCT: 27 % (ref 12–46)
MCH: 31.6 pg (ref 26.0–34.0)
MCHC: 32.6 g/dL (ref 30.0–36.0)
MCV: 96.9 fL (ref 78.0–100.0)
Monocytes Absolute: 0.7 10*3/uL (ref 0.1–1.0)
Monocytes Relative: 8 % (ref 3–12)
NEUTROS PCT: 63 % (ref 43–77)
Neutro Abs: 4.9 10*3/uL (ref 1.7–7.7)
PLATELETS: 265 10*3/uL (ref 150–400)
RBC: 3.83 MIL/uL — AB (ref 3.87–5.11)
RDW: 14.6 % (ref 11.5–15.5)
WBC: 7.9 10*3/uL (ref 4.0–10.5)

## 2013-11-26 LAB — APTT: aPTT: 31 seconds (ref 24–37)

## 2013-11-26 LAB — PROTIME-INR
INR: 1.02 (ref 0.00–1.49)
Prothrombin Time: 13.4 seconds (ref 11.6–15.2)

## 2013-11-26 NOTE — Progress Notes (Signed)
Primary - dr. Tommy Medal - [facility] No cardiologist  Ekg, echo on chart from dr. Minna Antis office

## 2013-11-26 NOTE — Pre-Procedure Instructions (Signed)
Monique Russo  11/26/2013   Your procedure is scheduled on:  Thursday, September 17th  Report to [facility] at [phone] AM.  Call this number if you have problems the morning of surgery: [phone]   Remember:   Do not eat food or drink liquids after midnight.   Take these medicines the morning of surgery with A SIP OF WATER: cymbalta, neurontin, methadone, eye drops  Stop taking aspirin, OTC vitamins/herbal medications (fish oil), NSAIDS (ibuprofen, advil, motrin) 7 days prior to surgery.   Do not wear jewelry, make-up or nail polish.  Do not wear lotions, powders, or perfumes, deodorant.  Do not shave 48 hours prior to surgery. Men may shave face and neck.  Do not bring valuables to the hospital.  [facility] is not responsible  for any belongings or valuables.               Contacts, dentures or bridgework may not be worn into surgery.  Leave suitcase in the car. After surgery it may be brought to your room.  For patients admitted to the hospital, discharge time is determined by yourtreatment team.          Please read over the following fact sheets that you were given: Pain Booklet, Coughing and Deep Breathing, Blood Transfusion Information and Surgical Site Infection Prevention Conception - Preparing for Surgery  Before surgery, you can play an important role.  Because skin is not sterile, your skin needs to be as free of germs as possible.  You can reduce the number of germs on you skin by washing with CHG (chlorahexidine gluconate) soap before surgery.  CHG is an antiseptic cleaner which kills germs and bonds with the skin to continue killing germs even after washing.  Please DO NOT use if you have an allergy to CHG or antibacterial soaps.  If your skin becomes reddened/irritated stop using the CHG and inform your nurse when you arrive at Short Stay.  Do not shave (including legs and underarms) for at least 48 hours prior to the first CHG shower.  You may  shave your face.  Please follow these instructions carefully:   1.  Shower with CHG Soap the night before surgery and the morning of Surgery.  2.  If you choose to wash your hair, wash your hair first as usual with your normal shampoo.  3.  After you shampoo, rinse your hair and body thoroughly to remove the shampoo.  4.  Use CHG as you would any other liquid soap.  You can apply CHG directly to the skin and wash gently with scrungie or a clean washcloth.  5.  Apply the CHG Soap to your body ONLY FROM THE NECK DOWN.  Do not use on open wounds or open sores.  Avoid contact with your eyes, ears, mouth and genitals (private parts).  Wash genitals (private parts) with your normal soap.  6.  Wash thoroughly, paying special attention to the area where your surgery will be performed.  7.  Thoroughly rinse your body with warm water from the neck down.  8.  DO NOT shower/wash with your normal soap after using and rinsing off the CHG Soap.  9.  Pat yourself dry with a clean towel.            10.  Wear clean pajamas.            11.  Place clean sheets on your bed the night of your first  shower and do not sleep with pets.  Day of Surgery  Do not apply any lotions/deoderants the morning of surgery.  Please wear clean clothes to the hospital/surgery center.

## 2013-11-27 LAB — URINALYSIS, ROUTINE W REFLEX MICROSCOPIC

## 2013-12-03 MED ORDER — CEFAZOLIN SODIUM-DEXTROSE 2-3 GM-% IV SOLR
2.0000 g | INTRAVENOUS | Status: AC
  Administered 2013-12-04: 2 g via INTRAVENOUS
  Filled 2013-12-03: qty 50

## 2013-12-04 ENCOUNTER — Encounter (HOSPITAL_COMMUNITY): Admitting: Certified Registered Nurse Anesthetist

## 2013-12-04 ENCOUNTER — Inpatient Hospital Stay (HOSPITAL_COMMUNITY): Admitting: Certified Registered Nurse Anesthetist

## 2013-12-04 ENCOUNTER — Encounter (HOSPITAL_COMMUNITY)
Admission: RE | Disposition: A | Discharge status: Skilled Nursing Facility | Payer: Self-pay | Source: Ambulatory Visit | Attending: Orthopedic Surgery

## 2013-12-04 ENCOUNTER — Inpatient Hospital Stay (HOSPITAL_COMMUNITY)

## 2013-12-04 ENCOUNTER — Encounter (HOSPITAL_COMMUNITY): Payer: Self-pay | Admitting: Certified Registered Nurse Anesthetist

## 2013-12-04 ENCOUNTER — Inpatient Hospital Stay (HOSPITAL_COMMUNITY)
Admission: RE | Admit: 2013-12-04 | Discharge: 2013-12-09 | DRG: 483 | Disposition: A | Discharge status: Skilled Nursing Facility | Source: Ambulatory Visit | Attending: Orthopedic Surgery | Admitting: Orthopedic Surgery

## 2013-12-04 DIAGNOSIS — S43429A Sprain of unspecified rotator cuff capsule, initial encounter: Secondary | ICD-10-CM | POA: Diagnosis present

## 2013-12-04 DIAGNOSIS — D62 Acute posthemorrhagic anemia: Secondary | ICD-10-CM | POA: Diagnosis not present

## 2013-12-04 DIAGNOSIS — I509 Heart failure, unspecified: Secondary | ICD-10-CM | POA: Diagnosis present

## 2013-12-04 DIAGNOSIS — Z23 Encounter for immunization: Secondary | ICD-10-CM

## 2013-12-04 DIAGNOSIS — M19019 Primary osteoarthritis, unspecified shoulder: Secondary | ICD-10-CM | POA: Diagnosis present

## 2013-12-04 DIAGNOSIS — M12819 Other specific arthropathies, not elsewhere classified, unspecified shoulder: Secondary | ICD-10-CM | POA: Diagnosis present

## 2013-12-04 DIAGNOSIS — K219 Gastro-esophageal reflux disease without esophagitis: Secondary | ICD-10-CM | POA: Diagnosis present

## 2013-12-04 DIAGNOSIS — Z79899 Other long term (current) drug therapy: Secondary | ICD-10-CM

## 2013-12-04 DIAGNOSIS — G5790 Unspecified mononeuropathy of unspecified lower limb: Secondary | ICD-10-CM | POA: Diagnosis present

## 2013-12-04 DIAGNOSIS — I959 Hypotension, unspecified: Secondary | ICD-10-CM | POA: Diagnosis not present

## 2013-12-04 DIAGNOSIS — M25519 Pain in unspecified shoulder: Secondary | ICD-10-CM | POA: Diagnosis present

## 2013-12-04 HISTORY — PX: REVERSE SHOULDER ARTHROPLASTY: SHX5054

## 2013-12-04 LAB — URINALYSIS, ROUTINE W REFLEX MICROSCOPIC
Bilirubin Urine: NEGATIVE
GLUCOSE, UA: NEGATIVE mg/dL
Hgb urine dipstick: NEGATIVE
Ketones, ur: NEGATIVE mg/dL
LEUKOCYTES UA: NEGATIVE
Nitrite: NEGATIVE
Protein, ur: NEGATIVE mg/dL
Specific Gravity, Urine: 1.01 (ref 1.005–1.030)
Urobilinogen, UA: 0.2 mg/dL (ref 0.0–1.0)
pH: 7.5 (ref 5.0–8.0)

## 2013-12-04 SURGERY — ARTHROPLASTY, SHOULDER, TOTAL, REVERSE
Anesthesia: Regional | Site: Shoulder

## 2013-12-04 MED ORDER — SODIUM CHLORIDE 0.9 % IR SOLN
Status: DC | PRN
  Administered 2013-12-04: 3000 mL

## 2013-12-04 MED ORDER — ALUMINUM HYDROXIDE GEL 320 MG/5ML PO SUSP
15.0000 mL | ORAL | Status: DC | PRN
  Filled 2013-12-04: qty 30

## 2013-12-04 MED ORDER — MENTHOL 3 MG MT LOZG: 1.0000 | LOZENGE | OROMUCOSAL | Status: DC | PRN

## 2013-12-04 MED ORDER — LACTATED RINGERS IV SOLN
INTRAVENOUS | Status: DC | PRN
  Administered 2013-12-04 (×2): via INTRAVENOUS

## 2013-12-04 MED ORDER — BUPIVACAINE LIPOSOME 1.3 % IJ SUSP
INTRAMUSCULAR | Status: DC | PRN
  Administered 2013-12-04: 20 mL

## 2013-12-04 MED ORDER — 0.9 % SODIUM CHLORIDE (POUR BTL) OPTIME
TOPICAL | Status: DC | PRN
  Administered 2013-12-04: 1000 mL

## 2013-12-04 MED ORDER — DIPHENHYDRAMINE HCL 12.5 MG/5ML PO ELIX
12.5000 mg | ORAL_SOLUTION | ORAL | Status: DC | PRN
  Administered 2013-12-09: 25 mg via ORAL
  Filled 2013-12-04: qty 10

## 2013-12-04 MED ORDER — FENTANYL CITRATE 0.05 MG/ML IJ SOLN
INTRAMUSCULAR | Status: DC | PRN
  Administered 2013-12-04: 25 ug via INTRAVENOUS

## 2013-12-04 MED ORDER — METOCLOPRAMIDE HCL 10 MG PO TABS: 5.0000 mg | ORAL_TABLET | Freq: Three times a day (TID) | ORAL | Status: DC | PRN

## 2013-12-04 MED ORDER — NEOSTIGMINE METHYLSULFATE 10 MG/10ML IV SOLN
INTRAVENOUS | Status: AC
  Filled 2013-12-04: qty 1

## 2013-12-04 MED ORDER — MORPHINE SULFATE 2 MG/ML IJ SOLN
1.0000 mg | INTRAMUSCULAR | Status: DC | PRN
  Administered 2013-12-04: 1 mg via INTRAVENOUS
  Filled 2013-12-04 (×2): qty 1

## 2013-12-04 MED ORDER — ASPIRIN EC 325 MG PO TBEC
325.0000 mg | DELAYED_RELEASE_TABLET | Freq: Two times a day (BID) | ORAL | Status: DC
Start: 2013-12-04 — End: 2013-12-09
  Administered 2013-12-04 – 2013-12-09 (×10): 325 mg via ORAL
  Filled 2013-12-04 (×12): qty 1

## 2013-12-04 MED ORDER — DULOXETINE HCL 30 MG PO CPEP
30.0000 mg | ORAL_CAPSULE | Freq: Every day | ORAL | Status: DC
  Administered 2013-12-04 – 2013-12-09 (×6): 30 mg via ORAL
  Filled 2013-12-04 (×6): qty 1

## 2013-12-04 MED ORDER — LACTATED RINGERS IV SOLN
INTRAVENOUS | Status: DC
  Administered 2013-12-04: 10:00:00 via INTRAVENOUS

## 2013-12-04 MED ORDER — SODIUM CHLORIDE 0.9 % IV SOLN
INTRAVENOUS | Status: DC
  Administered 2013-12-04 – 2013-12-06 (×4): via INTRAVENOUS

## 2013-12-04 MED ORDER — DOCUSATE SODIUM 100 MG PO CAPS
100.0000 mg | ORAL_CAPSULE | Freq: Two times a day (BID) | ORAL | Status: DC
  Administered 2013-12-04 – 2013-12-09 (×9): 100 mg via ORAL
  Filled 2013-12-04 (×11): qty 1

## 2013-12-04 MED ORDER — PHENYLEPHRINE HCL 10 MG/ML IJ SOLN
10.0000 mg | INTRAVENOUS | Status: DC | PRN
  Administered 2013-12-04: 40 ug/min via INTRAVENOUS

## 2013-12-04 MED ORDER — TEMAZEPAM 15 MG PO CAPS
30.0000 mg | ORAL_CAPSULE | Freq: Every evening | ORAL | Status: DC | PRN
  Administered 2013-12-04 – 2013-12-08 (×3): 30 mg via ORAL
  Filled 2013-12-04 (×4): qty 2

## 2013-12-04 MED ORDER — PROPOFOL 10 MG/ML IV BOLUS
INTRAVENOUS | Status: AC
  Filled 2013-12-04: qty 20

## 2013-12-04 MED ORDER — NEOSTIGMINE METHYLSULFATE 10 MG/10ML IV SOLN
INTRAVENOUS | Status: DC | PRN
  Administered 2013-12-04: 4 mg via INTRAVENOUS

## 2013-12-04 MED ORDER — BISACODYL 10 MG RE SUPP: 10.0000 mg | Freq: Every day | RECTAL | Status: DC | PRN

## 2013-12-04 MED ORDER — BUPIVACAINE LIPOSOME 1.3 % IJ SUSP
20.0000 mL | INTRAMUSCULAR | Status: DC
  Filled 2013-12-04: qty 20

## 2013-12-04 MED ORDER — ACETAMINOPHEN 325 MG PO TABS: 650.0000 mg | ORAL_TABLET | Freq: Four times a day (QID) | ORAL | Status: DC | PRN

## 2013-12-04 MED ORDER — MORPHINE SULFATE 2 MG/ML IJ SOLN
INTRAMUSCULAR | Status: AC
  Filled 2013-12-04: qty 1

## 2013-12-04 MED ORDER — GLYCOPYRROLATE 0.2 MG/ML IJ SOLN
INTRAMUSCULAR | Status: AC
  Filled 2013-12-04: qty 3

## 2013-12-04 MED ORDER — FLEET ENEMA 7-19 GM/118ML RE ENEM: 1.0000 | ENEMA | Freq: Once | RECTAL | Status: AC | PRN

## 2013-12-04 MED ORDER — PROPOFOL 10 MG/ML IV BOLUS
INTRAVENOUS | Status: DC | PRN
  Administered 2013-12-04: 70 mg via INTRAVENOUS

## 2013-12-04 MED ORDER — ONDANSETRON HCL 4 MG/2ML IJ SOLN
4.0000 mg | Freq: Four times a day (QID) | INTRAMUSCULAR | Status: DC | PRN
  Administered 2013-12-06: 4 mg via INTRAVENOUS
  Filled 2013-12-04: qty 2

## 2013-12-04 MED ORDER — INFLUENZA VAC SPLIT QUAD 0.5 ML IM SUSY
0.5000 mL | PREFILLED_SYRINGE | INTRAMUSCULAR | Status: DC
  Filled 2013-12-04: qty 0.5

## 2013-12-04 MED ORDER — CALCIUM CHLORIDE 10 % IV SOLN
INTRAVENOUS | Status: DC | PRN
  Administered 2013-12-04 (×6): 100 mg via INTRAVENOUS

## 2013-12-04 MED ORDER — POLYETHYLENE GLYCOL 3350 17 G PO PACK
17.0000 g | PACK | Freq: Every day | ORAL | Status: DC | PRN
  Administered 2013-12-07: 17 g via ORAL
  Filled 2013-12-04: qty 1

## 2013-12-04 MED ORDER — METHADONE HCL 5 MG PO TABS
7.5000 mg | ORAL_TABLET | Freq: Three times a day (TID) | ORAL | Status: DC
  Administered 2013-12-04 – 2013-12-09 (×11): 7.5 mg via ORAL
  Filled 2013-12-04 (×12): qty 2

## 2013-12-04 MED ORDER — POVIDONE-IODINE 7.5 % EX SOLN
Freq: Once | CUTANEOUS | Status: DC
Start: 2013-12-04 — End: 2013-12-04

## 2013-12-04 MED ORDER — PHENOL 1.4 % MT LIQD: 1.0000 | OROMUCOSAL | Status: DC | PRN

## 2013-12-04 MED ORDER — SODIUM CHLORIDE 0.9 % IJ SOLN
INTRAMUSCULAR | Status: DC | PRN
  Administered 2013-12-04: 20 mL

## 2013-12-04 MED ORDER — ROCURONIUM BROMIDE 50 MG/5ML IV SOLN
INTRAVENOUS | Status: AC
  Filled 2013-12-04: qty 1

## 2013-12-04 MED ORDER — TIMOLOL MALEATE 0.5 % OP SOLN
1.0000 [drp] | Freq: Every day | OPHTHALMIC | Status: DC
  Administered 2013-12-06 – 2013-12-09 (×3): 1 [drp] via OPHTHALMIC
  Filled 2013-12-04 (×2): qty 5

## 2013-12-04 MED ORDER — PNEUMOCOCCAL VAC POLYVALENT 25 MCG/0.5ML IJ INJ
0.5000 mL | INJECTION | INTRAMUSCULAR | Status: DC
  Filled 2013-12-04: qty 0.5

## 2013-12-04 MED ORDER — ROCURONIUM BROMIDE 100 MG/10ML IV SOLN
INTRAVENOUS | Status: DC | PRN
  Administered 2013-12-04: 35 mg via INTRAVENOUS

## 2013-12-04 MED ORDER — GABAPENTIN 400 MG PO CAPS
400.0000 mg | ORAL_CAPSULE | Freq: Every day | ORAL | Status: DC
  Administered 2013-12-05 – 2013-12-09 (×5): 400 mg via ORAL
  Filled 2013-12-04 (×5): qty 1

## 2013-12-04 MED ORDER — METOCLOPRAMIDE HCL 5 MG/ML IJ SOLN
5.0000 mg | Freq: Three times a day (TID) | INTRAMUSCULAR | Status: DC | PRN
  Administered 2013-12-07: 10 mg via INTRAVENOUS
  Filled 2013-12-04: qty 2

## 2013-12-04 MED ORDER — PHENYLEPHRINE HCL 10 MG/ML IJ SOLN
INTRAMUSCULAR | Status: DC | PRN
  Administered 2013-12-04: 80 ug via INTRAVENOUS

## 2013-12-04 MED ORDER — ACETAMINOPHEN 650 MG RE SUPP: 650.0000 mg | Freq: Four times a day (QID) | RECTAL | Status: DC | PRN

## 2013-12-04 MED ORDER — LIDOCAINE HCL 4 % MT SOLN
OROMUCOSAL | Status: DC | PRN
  Administered 2013-12-04: 4 mL via TOPICAL

## 2013-12-04 MED ORDER — HYDROMORPHONE HCL 1 MG/ML IJ SOLN: 0.2500 mg | INTRAMUSCULAR | Status: DC | PRN

## 2013-12-04 MED ORDER — FENTANYL CITRATE 0.05 MG/ML IJ SOLN
50.0000 ug | INTRAMUSCULAR | Status: DC | PRN
  Administered 2013-12-04: 100 ug via INTRAVENOUS

## 2013-12-04 MED ORDER — ONDANSETRON HCL 4 MG/2ML IJ SOLN
INTRAMUSCULAR | Status: DC | PRN
  Administered 2013-12-04: 4 mg via INTRAVENOUS

## 2013-12-04 MED ORDER — ONDANSETRON HCL 4 MG/2ML IJ SOLN
INTRAMUSCULAR | Status: AC
  Filled 2013-12-04: qty 2

## 2013-12-04 MED ORDER — LIDOCAINE HCL (CARDIAC) 20 MG/ML IV SOLN
INTRAVENOUS | Status: DC | PRN
  Administered 2013-12-04: 80 mg via INTRAVENOUS

## 2013-12-04 MED ORDER — HYDROCODONE-ACETAMINOPHEN 5-325 MG PO TABS: 1.0000 | ORAL_TABLET | ORAL | Status: DC | PRN

## 2013-12-04 MED ORDER — TIMOLOL HEMIHYDRATE 0.5 % OP SOLN: 1.0000 [drp] | Freq: Every day | OPHTHALMIC | Status: DC

## 2013-12-04 MED ORDER — FENTANYL CITRATE 0.05 MG/ML IJ SOLN
INTRAMUSCULAR | Status: AC
  Administered 2013-12-04: 100 ug via INTRAVENOUS
  Filled 2013-12-04: qty 2

## 2013-12-04 MED ORDER — ONDANSETRON HCL 4 MG PO TABS: 4.0000 mg | ORAL_TABLET | Freq: Four times a day (QID) | ORAL | Status: DC | PRN

## 2013-12-04 MED ORDER — CALCIUM CHLORIDE 10 % IV SOLN
INTRAVENOUS | Status: AC
  Filled 2013-12-04: qty 10

## 2013-12-04 MED ORDER — ONDANSETRON HCL 4 MG/2ML IJ SOLN: 4.0000 mg | Freq: Once | INTRAMUSCULAR | Status: DC | PRN

## 2013-12-04 MED ORDER — LIDOCAINE HCL (CARDIAC) 20 MG/ML IV SOLN
INTRAVENOUS | Status: AC
  Filled 2013-12-04: qty 10

## 2013-12-04 MED ORDER — OXYCODONE-ACETAMINOPHEN 5-325 MG PO TABS
1.0000 | ORAL_TABLET | ORAL | Status: DC | PRN
  Administered 2013-12-05: 2 via ORAL
  Administered 2013-12-07 – 2013-12-09 (×5): 1 via ORAL
  Filled 2013-12-04: qty 2
  Filled 2013-12-04 (×3): qty 1
  Filled 2013-12-04: qty 2
  Filled 2013-12-04: qty 1

## 2013-12-04 MED ORDER — CEFAZOLIN SODIUM 1-5 GM-% IV SOLN
1.0000 g | Freq: Four times a day (QID) | INTRAVENOUS | Status: AC
  Administered 2013-12-04: 1 g via INTRAVENOUS
  Filled 2013-12-04 (×4): qty 50

## 2013-12-04 MED ORDER — METHOCARBAMOL 750 MG PO TABS
750.0000 mg | ORAL_TABLET | Freq: Three times a day (TID) | ORAL | Status: DC
  Administered 2013-12-04 – 2013-12-08 (×6): 750 mg via ORAL
  Filled 2013-12-04 (×17): qty 1

## 2013-12-04 MED ORDER — OXYCODONE HCL 5 MG PO TABS
5.0000 mg | ORAL_TABLET | ORAL | Status: DC | PRN
  Administered 2013-12-04: 10 mg via ORAL
  Administered 2013-12-06: 5 mg via ORAL
  Filled 2013-12-04 (×2): qty 2

## 2013-12-04 MED ORDER — ALBUMIN HUMAN 5 % IV SOLN
INTRAVENOUS | Status: DC | PRN
  Administered 2013-12-04 (×2): via INTRAVENOUS

## 2013-12-04 MED ORDER — FENTANYL CITRATE 0.05 MG/ML IJ SOLN
INTRAMUSCULAR | Status: AC
  Filled 2013-12-04: qty 5

## 2013-12-04 MED ORDER — GLYCOPYRROLATE 0.2 MG/ML IJ SOLN
INTRAMUSCULAR | Status: DC | PRN
  Administered 2013-12-04: 0.4 mg via INTRAVENOUS
  Administered 2013-12-04: 0.6 mg via INTRAVENOUS
  Administered 2013-12-04: 0.4 mg via INTRAVENOUS

## 2013-12-04 SURGICAL SUPPLY — 73 items
BIT DRILL 170X2.5X (BIT) IMPLANT
BIT DRL 170X2.5X (BIT)
BLADE SAW SAG 73X25 THK (BLADE) ×2
BLADE SAW SGTL 73X25 THK (BLADE) ×1 IMPLANT
BLADE SURG 15 STRL LF DISP TIS (BLADE) ×1 IMPLANT
BLADE SURG 15 STRL SS (BLADE) ×2
BOWL SMART MIX CTS (DISPOSABLE) IMPLANT
CAPT SHOULD DELTAXTEND CEM MOD ×3 IMPLANT
CHLORAPREP W/TINT 26ML (MISCELLANEOUS) ×3 IMPLANT
CLOSURE WOUND 1/2 X4 (GAUZE/BANDAGES/DRESSINGS) ×1
COVER SURGICAL LIGHT HANDLE (MISCELLANEOUS) ×3 IMPLANT
DRAPE INCISE IOBAN 66X45 STRL (DRAPES) ×6 IMPLANT
DRAPE SURG 17X23 STRL (DRAPES) ×3 IMPLANT
DRAPE U-SHAPE 47X51 STRL (DRAPES) ×3 IMPLANT
DRILL 2.5 (BIT)
DRILL BIT 5/64 (BIT) ×3 IMPLANT
ELECT BLADE 4.0 EZ CLEAN MEGAD (MISCELLANEOUS) ×3
ELECT REM PT RETURN 9FT ADLT (ELECTROSURGICAL) ×3
ELECTRODE BLDE 4.0 EZ CLN MEGD (MISCELLANEOUS) ×1 IMPLANT
ELECTRODE REM PT RTRN 9FT ADLT (ELECTROSURGICAL) ×1 IMPLANT
EVACUATOR 1/8 PVC DRAIN (DRAIN) IMPLANT
GLOVE BIO SURGEON STRL SZ7 (GLOVE) ×3 IMPLANT
GLOVE BIO SURGEON STRL SZ7.5 (GLOVE) ×3 IMPLANT
GLOVE BIOGEL PI IND STRL 7.0 (GLOVE) ×1 IMPLANT
GLOVE BIOGEL PI IND STRL 8 (GLOVE) ×1 IMPLANT
GLOVE BIOGEL PI INDICATOR 7.0 (GLOVE) ×2
GLOVE BIOGEL PI INDICATOR 8 (GLOVE) ×2
GOWN STRL REUS W/ TWL LRG LVL3 (GOWN DISPOSABLE) ×3 IMPLANT
GOWN STRL REUS W/ TWL XL LVL3 (GOWN DISPOSABLE) ×1 IMPLANT
GOWN STRL REUS W/TWL LRG LVL3 (GOWN DISPOSABLE) ×9
GOWN STRL REUS W/TWL XL LVL3 (GOWN DISPOSABLE) ×3
HANDPIECE INTERPULSE COAX TIP (DISPOSABLE) ×2
HOOD PEEL AWAY FACE SHEILD DIS (HOOD) ×6 IMPLANT
KIT BASIN OR (CUSTOM PROCEDURE TRAY) ×3 IMPLANT
KIT ROOM TURNOVER OR (KITS) ×3 IMPLANT
MANIFOLD NEPTUNE II (INSTRUMENTS) ×3 IMPLANT
NEEDLE HYPO 25GX1X1/2 BEV (NEEDLE) IMPLANT
NEEDLE MAYO TROCAR (NEEDLE) IMPLANT
NOZZLE PRISM 8.5MM (MISCELLANEOUS) IMPLANT
NS IRRIG 1000ML POUR BTL (IV SOLUTION) ×3 IMPLANT
PACK SHOULDER (CUSTOM PROCEDURE TRAY) ×3 IMPLANT
PAD ABD 8X10 STRL (GAUZE/BANDAGES/DRESSINGS) ×3 IMPLANT
PAD ARMBOARD 7.5X6 YLW CONV (MISCELLANEOUS) ×6 IMPLANT
PIN GUIDE 1.2 (PIN) IMPLANT
PIN GUIDE GLENOPHERE 1.5MX300M (PIN) IMPLANT
PIN METAGLENE 2.5 (PIN) IMPLANT
RETRIEVER SUT HEWSON (MISCELLANEOUS) ×3 IMPLANT
SET HNDPC FAN SPRY TIP SCT (DISPOSABLE) ×1 IMPLANT
SLING ARM IMMOBILIZER LRG (SOFTGOODS) ×3 IMPLANT
SLING ARM IMMOBILIZER MED (SOFTGOODS) IMPLANT
SPONGE GAUZE 4X4 12PLY STER LF (GAUZE/BANDAGES/DRESSINGS) ×3 IMPLANT
SPONGE LAP 18X18 X RAY DECT (DISPOSABLE) ×3 IMPLANT
SPONGE LAP 4X18 X RAY DECT (DISPOSABLE) IMPLANT
STRIP CLOSURE SKIN 1/2X4 (GAUZE/BANDAGES/DRESSINGS) ×2 IMPLANT
SUCTION FRAZIER TIP 10 FR DISP (SUCTIONS) ×3 IMPLANT
SUPPORT WRAP ARM LG (MISCELLANEOUS) ×3 IMPLANT
SUT ETHIBOND 2 OS 4 DA (SUTURE) ×6 IMPLANT
SUT ETHIBOND NAB CT1 #1 30IN (SUTURE) IMPLANT
SUT FIBERWIRE #2 38 T-5 BLUE (SUTURE)
SUT MNCRL AB 4-0 PS2 18 (SUTURE) ×3 IMPLANT
SUT SILK 2 0 TIES 17X18 (SUTURE)
SUT SILK 2-0 18XBRD TIE BLK (SUTURE) IMPLANT
SUT VIC AB 0 CTB1 27 (SUTURE) ×3 IMPLANT
SUT VIC AB 2-0 CT1 27 (SUTURE) ×6
SUT VIC AB 2-0 CT1 TAPERPNT 27 (SUTURE) ×2 IMPLANT
SUTURE FIBERWR #2 38 T-5 BLUE (SUTURE) IMPLANT
SYR CONTROL 10ML LL (SYRINGE) IMPLANT
SYRINGE TOOMEY DISP (SYRINGE) IMPLANT
TAPE CLOTH SURG 6X10 WHT LF (GAUZE/BANDAGES/DRESSINGS) ×3 IMPLANT
TOWEL OR 17X24 6PK STRL BLUE (TOWEL DISPOSABLE) ×3 IMPLANT
TOWEL OR 17X26 10 PK STRL BLUE (TOWEL DISPOSABLE) ×3 IMPLANT
WATER STERILE IRR 1000ML POUR (IV SOLUTION) IMPLANT
YANKAUER SUCT BULB TIP NO VENT (SUCTIONS) ×3 IMPLANT

## 2013-12-04 NOTE — Discharge Instructions (Signed)
Discharge Instructions after Reverse Total Shoulder Arthroplasty ° ° °A sling has been provided for you. You are to where this at all times, even while sleeping, until your first post operative visit with Dr. Chandler. °Use ice on the shoulder intermittently over the first 48 hours after surgery.  °Pain medicine has been prescribed for you.  °Use your medicine liberally over the first 48 hours, and then you can begin to taper your use. You may take Extra Strength Tylenol or Tylenol only in place of the pain pills. DO NOT take ANY nonsteroidal anti-inflammatory pain medications: Advil, Motrin, Ibuprofen, Aleve, Naproxen or Naprosyn.  °Take one aspirin a day for 2 weeks after surgery, unless you have an aspirin sensitivity/allergy or asthma.  °You may remove your dressing after two days  °You may shower 5 days after surgery. The incisions CANNOT get wet prior to 5 days. Simply allow the water to wash over the site and then pat dry. Do not rub the incisions. Make sure your axilla (armpit) is completely dry after showering. ° ° ° °Please call [phone] during normal business hours or [phone] after hours for any problems. Including the following: ° °- excessive redness of the incisions °- drainage for more than 4 days °- fever of more than 101.5 F ° °*Please note that pain medications will not be refilled after hours or on weekends. ° ° ° ° °

## 2013-12-04 NOTE — Anesthesia Preprocedure Evaluation (Addendum)
Anesthesia Evaluation  Patient identified by MRN, date of birth, ID band Patient awake    Reviewed: Allergy & Precautions, H&P , NPO status , Patient's Chart, lab work & pertinent test results  Airway Mallampati: II TM Distance: >3 FB   Mouth opening: Limited Mouth Opening  Dental  (+) Dental Advisory Given, Teeth Intact,    Pulmonary          Cardiovascular hypertension, +CHF     Neuro/Psych PSYCHIATRIC DISORDERS Depression    GI/Hepatic GERD-  Medicated,  Endo/Other  Morbid obesity  Renal/GU      Musculoskeletal  (+) Arthritis -,   Abdominal   Peds  Hematology   Anesthesia Other Findings   Reproductive/Obstetrics                        Anesthesia Physical Anesthesia Plan  ASA: III  Anesthesia Plan: General and Regional   Post-op Pain Management:    Induction: Intravenous  Airway Management Planned: Oral ETT  Additional Equipment:   Intra-op Plan:   Post-operative Plan: Extubation in OR  Informed Consent: I have reviewed the patients History and Physical, chart, labs and discussed the procedure including the risks, benefits and alternatives for the proposed anesthesia with the patient or authorized representative who has indicated his/her understanding and acceptance.   Dental advisory given  Plan Discussed with: CRNA, Anesthesiologist and Surgeon  Anesthesia Plan Comments:        Anesthesia Quick Evaluation

## 2013-12-04 NOTE — H&P (Signed)
Monique Russo is an 80 y.o. female.   Chief Complaint: R shoulder pain / dysfunction HPI: H/o severe pain and dysfunction R shoulder with worsening RCT arthropathy.  Pain has limited sleep and has caused severe decline in quality of life.  She has failed nonoperative management with multiple injections, medications, and activity modification.  Past Medical History  Diagnosis Date  . Arthritis   . CHF (congestive heart failure)   . Pneumonia   . Depression   . GERD (gastroesophageal reflux disease)     Past Surgical History  Procedure Laterality Date  . Abdominal hysterectomy    . Back surgery    . Colon surgery    . Hernia repair    . Joint replacement Left     shoulder    History reviewed. No pertinent family history. Social History:  reports that she has never smoked. She does not have any smokeless tobacco history on file. She reports that she drinks about 4.2 ounces of alcohol per week. Her drug history is not on file.  Allergies: No Known Allergies  Medications Prior to Admission  Medication Sig Dispense Refill  . acetaminophen (TYLENOL) 325 MG tablet Take 325 mg by mouth every 6 (six) hours as needed for mild pain or headache.      . Ascorbic Acid (VITAMIN C) 1000 MG tablet Take 1,000 mg by mouth daily.      Marland Kitchen b complex vitamins tablet Take 1 tablet by mouth daily.      . bisacodyl (DULCOLAX) 10 MG suppository Place 1 suppository (10 mg total) rectally daily as needed for moderate constipation (May repeat times one).  12 suppository  0  . Cholecalciferol (HM VITAMIN D3) 2000 UNITS CAPS Take 1 capsule by mouth daily.      . diclofenac (VOLTAREN) 0.1 % ophthalmic solution 4 (four) times daily.      . diclofenac (VOLTAREN) 50 MG EC tablet Take 50 mg by mouth daily.      . diclofenac sodium (VOLTAREN) 1 % GEL Apply topically 4 (four) times daily. 1 gm      . DULoxetine (CYMBALTA) 30 MG capsule Take 30 mg by mouth daily.      . furosemide (LASIX) 80 MG tablet Take 80 mg by  mouth daily.      Marland Kitchen gabapentin (NEURONTIN) 400 MG capsule Take 400 mg by mouth daily.      . magnesium hydroxide (MILK OF MAGNESIA) 400 MG/5ML suspension Take 30 mLs by mouth daily as needed for mild constipation.      . methadone (DOLOPHINE) 5 MG tablet Take 7.5 mg by mouth every 8 (eight) hours.      . methocarbamol (ROBAXIN) 750 MG tablet Take 750 mg by mouth 3 (three) times daily.      . Omega-3 Fatty Acids (FISH OIL) 1200 MG CAPS Take 2 capsules by mouth daily.      Marland Kitchen oxyCODONE-acetaminophen (PERCOCET) 10-325 MG per tablet Take 1 tablet by mouth every 8 (eight) hours as needed for pain.      . potassium chloride SA (K-DUR,KLOR-CON) 20 MEQ tablet Take 20 mEq by mouth daily as needed (Only takes with lasix).      Marland Kitchen temazepam (RESTORIL) 30 MG capsule Take 1 capsule (30 mg total) by mouth at bedtime as needed for sleep.  30 capsule  0  . timolol (BETIMOL) 0.5 % ophthalmic solution Place 1 drop into both eyes daily.       . vitamin E 400 UNIT capsule Take  800 Units by mouth daily.        No results found for this or any previous visit (from the past 48 hour(s)). No results found.  Review of Systems  All other systems reviewed and are negative.   Blood pressure 137/53, pulse 65, temperature 98.6 F (37 C), temperature source Oral, resp. rate 20, height 5' (1.524 m), weight 79.833 kg (176 lb), SpO2 93.00%. Physical Exam  Constitutional: She is oriented to person, place, and time. She appears well-developed and well-nourished.  HENT:  Head: Atraumatic.  Eyes: EOM are normal.  Cardiovascular: Intact distal pulses.   Respiratory: Effort normal.  Musculoskeletal:  Severe pain with limited R shoulder ROM.  Neurological: She is alert and oriented to person, place, and time.  Skin: Skin is warm and dry.  Psychiatric: She has a normal mood and affect.     Assessment/Plan R shoulder rotator cuff tear arthropathy Plan R reverse TSA Risks / benefits of surgery discussed Consent on chart   NPO for OR Preop antibiotics   Kamori Kitchens WILLIAM 12/04/2013, 10:59 AM

## 2013-12-04 NOTE — Transfer of Care (Signed)
Immediate Anesthesia Transfer of Care Note  Patient: Monique Russo  Procedure(s) Performed: Procedure(s) with comments: REVERSE SHOULDER ARTHROPLASTY - Right reverse total shoulder arthroplasty  Patient Location: PACU  Anesthesia Type:General and Regional  Level of Consciousness: awake, alert  and oriented  Airway & Oxygen Therapy: Patient Spontanous Breathing and Patient connected to nasal cannula oxygen  Post-op Assessment: Report given to PACU RN, Post -op Vital signs reviewed and stable and Patient moving all extremities X 4  Post vital signs: Reviewed and stable  Complications: No apparent anesthesia complications

## 2013-12-04 NOTE — Op Note (Signed)
Procedure(s): REVERSE SHOULDER ARTHROPLASTY Procedure Note  Monique Russo female 80 y.o. 12/04/2013  Procedure(s) and Anesthesia Type:    * RIGHT REVERSE SHOULDER ARTHROPLASTY - General   Indications:  80 y.o. female  With endstage right shoulder arthritis with irrepairable rotator cuff tear. Pain and dysfunction interfered with quality of life and nonoperative treatment with activity modification, NSAIDS and injections failed.     Surgeon: Nita Sells   Assistants: Jeanmarie Hubert PA-C [facility] was present and scrubbed throughout the procedure and was essential in positioning, retraction, exposure, and closure)  Anesthesia: General endotracheal anesthesia with preoperative interscalene block given by the attending anesthesiologist    Procedure Detail  Right REVERSE SHOULDER ARTHROPLASTY   Estimated Blood Loss:  400 mL         Drains: None  Blood Given: none          Specimens: none        Complications:  * No complications entered in OR log *         Disposition: PACU - hemodynamically stable.         Condition: stable      OPERATIVE FINDINGS:  A DePuy press-fit reverse total shoulder arthroplasty was placed with a  size 12 stem, a 38 eccentric glenosphere, and a 6-mm poly insert. The base plate  fixation was very good.  PROCEDURE: The patient was identified in the preoperative holding area  where I personally marked the operative site after verifying site, side,  and procedure with the patient. An interscalene block given by  the attending anesthesiologist in the holding area and the patient was taken back to the operating room where all extremities were  carefully padded in position after general anesthesia was induced. She  was placed in a beach-chair position and the operative upper extremity was  prepped and draped in a standard sterile fashion. An approximately 10-  cm incision was made from the tip of the coracoid process to the center   point of the humerus at the level of the axilla. Dissection was carried  down through subcutaneous tissues to the level of the cephalic vein  which was taken laterally with the deltoid. The pectoralis major was  retracted medially. The subdeltoid space was developed and the lateral  edge of the conjoined tendon was identified. The undersurface of  conjoined tendon was palpated and the musculocutaneous nerve was not in  the field. Retractor was placed underneath the conjoined and second  retractor was placed lateral into the deltoid. The circumflex humeral  artery and vessels were identified and clamped and coagulated. The  biceps tendon was absent.  The subscapularis was chronically torn.  The  joint was then gently externally rotated while the capsule was released  from the humeral neck around to just beyond the 6 o'clock position. At  this point, the joint was dislocated and the humeral head was presented  into the wound. The excessive osteophyte formation was removed with a  large rongeur and the intramedullary canal was then entered with  sequential reamers up to a 12-mm reamer which was felt to be the  appropriate size. The cutting guide was used to make the appropriate  head cut and the head was saved potentially bone grafting.  The glenoid was exposed with the arm in an  abducted extended position. The anterior and posterior labrum were  completely excised and the capsule was released circumferentially to  allow for exposure of the glenoid for preparation. The guidepin was  placed using the guide in neutral angulation and the reamer was  placed over the guidepin to ream down to concentric surface. Superior  hand reamer was used as well. The central drill hole was then made and did have a slight perforation of the glenoid vault.  The Metaglene was then impacted with  Very good press fit. The superior and inferior screws were then  drilled, measured, and filled with appropriate-sized  locking screw  alternatively tightening top and bottom to bring the Metaglene down  tightly against the bone. Posterior and anterior screws were then placed. Fixation was moderate.  The glenosphere was then impacted over the [facility] taper and tightened down  with the screw. At this point the proximal humerus was exposed and the metaphyseal reamer was used for a size 1 centered. The trial was implanted. With trial reduction with 3 and 6 mm implants there was tendency towards hinging and dislocation with external rotation and adduction. Was able to palpate some excess bone beneath the eccentric portion of the glenoshere, which I felt was causing the impingement. Therefore the glenoid sphere removed and the trial humeral head was retracted posteriorly. The inferior aspect of the glenoid was carefully exposed and the impinging bone was carefully removed using small osteotome and rongeur taking care to protect the axillary nerve. Once I felt that all impinging bone was removed the glenoid sphere was again placed. Again now with trial reduction of the +3 and +6 trials there was no tendency towards impingement or dislocation and the joint was stable.  Therefore, final humeral stem was placed press fit.  And then the trial polyethylene inserts were tested again and the +6 implant was felt to be the most appropriate for final insertion. The joint was reduced taken through full range of motion and felt to be stable. Soft tissue tension was appropriate. The joint was then copiously irrigated with pulse  lavage and the wound was then closed. The subscapularis was not repaired.  She was noted to have more than usual amount of bleeding diffusely throughout the procedure. The wound and capsule deeply were infiltrated with 40 cc of a mixture 20cc Exparel and 20 cc normal saline. Skin was closed with 2-0 Vicryl in a deep dermal layer and 4-0  Monocryl for skin closure. Steri-Strips were applied.terile  dressings were then  applied as well as a sling. The patient was allowed  to awaken from general anesthesia, transferred to stretcher, and taken  to recovery room in stable condition.   POSTOPERATIVE PLAN: The patient will be kept in the hospital postoperatively  for pain control and therapy.

## 2013-12-04 NOTE — Progress Notes (Addendum)
Patient was complaining about the noise the pulse ox monitor makes when she moves or her SATs drop.  She requested it be discontinued and so did her husband.  Per their request, I have turned the monitor off.  Patient stated she does not want to have PT/OT work with her during this visit.  She states she has arthritis and does not walk much at home, it is very painful.  On her right hand she is unable to straighten the middle and ring finger, on her left the two middle fingers cross and she has difficulty straightening those.   She has a catheter that was placed by Eustaquio Maize, RN this afternoon because of urinary retention and the fact she does not want to get out of bed until time to be discharged.  She stated this to myself and Beth.

## 2013-12-04 NOTE — Progress Notes (Signed)
Rept to [employer] PA. Pt unable to void and pt scanned for 955CC. Pt refuses to get up or even move in the bed to reposition pad under patient. Pt states that she "isn't getting up until Saturday before she goes home." Explained to pt that laying in bed decreases endurance, increases risk of blood clots, and increases possibility of pneumonia. Pt instructed not getting up makes it harder for her to void. Pt states, "I don't care. I have laid in the bed before for three days and didn't have any problems".  Husband and daughter at bedside. Pt states that she had problems emptying her bladder and starting her urine stream before she came to the hospital.  Per PA's order-indwelling foley placed and UA and Culture obtained. Will continue to monitor.

## 2013-12-04 NOTE — Progress Notes (Signed)
Utilization review completed.  

## 2013-12-04 NOTE — Anesthesia Procedure Notes (Addendum)
Anesthesia Regional Block:  Interscalene brachial plexus block  Pre-Anesthetic Checklist: ,, timeout performed, Correct Patient, Correct Site, Correct Laterality, Correct Procedure, Correct Position, site marked, Risks and benefits discussed,  Surgical consent,  Pre-op evaluation,  At surgeon's request and post-op pain management  Laterality: Right  Prep: Maximum Sterile Barrier Precautions used, chloraprep and alcohol swabs       Needles:  Injection technique: Single-shot  Needle Type: Stimulator Needle - 40        Needle insertion depth: 4 cm   Additional Needles:  Procedures: nerve stimulator Interscalene brachial plexus block  Nerve Stimulator or Paresthesia:  Response: 0.5 mA, 0.1 ms, 4 cm  Additional Responses:   Narrative:  Start time: 12/04/2013 11:00 AM End time: 12/04/2013 11:10 AM Injection made incrementally with aspirations every 5 mL.  Performed by: Personally  Anesthesiologist: Sharolyn Douglas MD  Additional Notes: Pt accepts procedure w/ risks. 20cc 0.5% Marcaine w/ epi w/o difficulty or discomfort. GES   Procedure Name: Intubation Date/Time: 12/04/2013 11:46 AM Performed by: Blair Heys E Pre-anesthesia Checklist: Patient identified, Emergency Drugs available, Suction available and Patient being monitored Patient Re-evaluated:Patient Re-evaluated prior to inductionOxygen Delivery Method: Circle system utilized Preoxygenation: Pre-oxygenation with 100% oxygen Intubation Type: IV induction Ventilation: Mask ventilation without difficulty and Oral airway inserted - appropriate to patient size Laryngoscope Size: Sabra Heck and 2 Grade View: Grade I Tube type: Oral Tube size: 7.5 mm Number of attempts: 1 Airway Equipment and Method: Stylet,  LTA kit utilized and Oral airway Placement Confirmation: ETT inserted through vocal cords under direct vision,  positive ETCO2 and breath sounds checked- equal and bilateral Secured at: 21 cm Tube secured with:  Tape Dental Injury: Teeth and Oropharynx as per pre-operative assessment

## 2013-12-05 ENCOUNTER — Encounter (HOSPITAL_COMMUNITY): Payer: Self-pay | Admitting: General Practice

## 2013-12-05 LAB — BASIC METABOLIC PANEL
Anion gap: 8 (ref 5–15)
BUN: 14 mg/dL (ref 6–23)
CALCIUM: 8.2 mg/dL — AB (ref 8.4–10.5)
CO2: 31 meq/L (ref 19–32)
Chloride: 90 mEq/L — ABNORMAL LOW (ref 96–112)
Creatinine, Ser: 0.66 mg/dL (ref 0.50–1.10)
GFR calc Af Amer: >90 mL/min (ref >90)
GFR calc non Af Amer: 81 mL/min — ABNORMAL LOW (ref >90)
Glucose, Bld: 121 mg/dL — ABNORMAL HIGH (ref 70–99)
Potassium: 3.4 mEq/L — ABNORMAL LOW (ref 3.7–5.3)
SODIUM: 129 meq/L — AB (ref 137–147)

## 2013-12-05 LAB — CBC
HCT: 24 % — ABNORMAL LOW (ref 36.0–46.0)
Hemoglobin: 8.3 g/dL — ABNORMAL LOW (ref 12.0–15.0)
MCH: 33.2 pg (ref 26.0–34.0)
MCHC: 34.6 g/dL (ref 30.0–36.0)
MCV: 96 fL (ref 78.0–100.0)
Platelets: 182 10*3/uL (ref 150–400)
RBC: 2.5 MIL/uL — AB (ref 3.87–5.11)
RDW: 14.4 % (ref 11.5–15.5)
WBC: 9.7 10*3/uL (ref 4.0–10.5)

## 2013-12-05 MED ORDER — SODIUM CHLORIDE 0.9 % IV BOLUS (SEPSIS)
1000.0000 mL | Freq: Once | INTRAVENOUS | Status: AC
  Administered 2013-12-05: 1000 mL via INTRAVENOUS

## 2013-12-05 NOTE — Evaluation (Addendum)
Occupational Therapy Evaluation Patient Details Name: Monique Russo MRN: [id number] DOB: [birth year] Today's Date: 12/05/2013    History of Present Illness Right reverse total shoulder; PMHx: OA, depression, CHF, left shoulder sx   Clinical Impression   This 80 yo female admitted and underwent above presents to acute OT with decreased ROM of Bil UEs (R>L), increased pain, decreased mobility all affecting her ability to care for herself and thus increasing burden of care on her family. She will benefit from acute OT with follow up [place] (may need SNF if does not progress well enough that husband can handle her at home)    Follow Up Recommendations  Home health OT (if does not progress quickly may need SNF)    Equipment Recommendations  None recommended by OT       Precautions / Restrictions Precautions Precautions: Fall Required Braces or Orthoses: Sling Restrictions Weight Bearing Restrictions: Yes RUE Weight Bearing: Non weight bearing  Also spoke to Dr. Tamera Punt in person and he said it was OK to do gentle PROM for shoulder abduction for bathing under her arm and for getting dressed and undressed.             ADL Overall ADL's : Needs assistance/impaired Eating/Feeding: Set up;Bed level;Supervision/ safety   Grooming: Moderate assistance;Bed level   Upper Body Bathing: Maximal assistance;Bed level   Lower Body Bathing: Total assistance;Bed level   Upper Body Dressing : Total assistance;Bed level   Lower Body Dressing: Total assistance;Bed level                 General ADL Comments: Showed husband how to gently abduct her arm to allow it to be able to be washed under her arm and made him aware it will probably take two people to do this (one to move arm and hold while the other washes)--he neede reinforcement               Pertinent Vitals/Pain Pain Assessment: 0-10 Pain Score: 10-Worst pain ever Pain Location: right shoulder post elbow-wrist-hand  exercises Pain Descriptors / Indicators: Throbbing Pain Intervention(s): Monitored during session;Repositioned;Ice applied;Patient requesting pain meds-RN notified     Hand Dominance Right   Extremity/Trunk Assessment Upper Extremity Assessment Upper Extremity Assessment: RUE deficits/detail;LUE deficits/detail RUE Deficits / Details: shoulder surgery this admission; trigger finger (4th digit) RUE Coordination: decreased fine motor;decreased gross motor LUE Deficits / Details: Issues with this arm pta (old shoulder surgery LUE Coordination: decreased gross motor           Communication Communication Communication: No difficulties   Cognition Arousal/Alertness: Awake/alert Behavior During Therapy: WFL for tasks assessed/performed Overall Cognitive Status: Within Functional Limits for tasks assessed                           Shoulder Instructions Shoulder Instructions ROM for elbow, wrist and digits of operated UE: Caregiver independent with task    Home Living Family/patient expects to be discharged to:: Private residence Living Arrangements: Spouse/significant other Available Help at Discharge: Family;Available 24 hours/day (husband; has daughter that lives with them as well, but she had multiple health issues) Type of Home: House       Home Layout: One level         Bathroom Toilet: Standard     Home Equipment: Bedside commode;Wheelchair - power;Walker - 2 wheels          Prior Functioning/Environment Level of Independence: Needs assistance  Gait / Transfers Assistance  Needed: min guard A with RW bedroom to dining room ADL's / Homemaking Assistance Needed: min guard A BSC, A with bathing and dressing   Comments: Pta pt got up to [facility] and to eat meals, but otherwise was in the bed    OT Diagnosis: Generalized weakness;Acute pain   OT Problem List: Decreased strength;Decreased range of motion;Decreased activity tolerance;Impaired balance (sitting  and/or standing);Pain;Impaired UE functional use;Increased edema   OT Treatment/Interventions: Self-care/ADL training;Patient/family education;Balance training;DME and/or AE instruction    OT Goals(Current goals can be found in the care plan section) Acute Rehab OT Goals Patient Stated Goal: home tomorrow OT Goal Formulation: With patient Time For Goal Achievement: 12/12/13 Potential to Achieve Goals: Fair  OT Frequency: Min 3X/week              End of Session Nurse Communication: Patient requests pain meds  Activity Tolerance: Patient limited by pain Patient left: in bed;with call bell/phone within reach;with family/visitor present   Time: 7579-7282 OT Time Calculation (min): 25 min Charges:  OT General Charges $OT Visit: 1 Procedure OT Evaluation $Initial OT Evaluation Tier I: 1 Procedure OT Treatments $Therapeutic Exercise: 8-22 mins  Almon Register [phone] 12/05/2013, 11:50 AM

## 2013-12-05 NOTE — Anesthesia Postprocedure Evaluation (Signed)
  Anesthesia Post-op Note  Patient: Monique Russo  Procedure(s) Performed: Procedure(s) with comments: REVERSE SHOULDER ARTHROPLASTY - Right reverse total shoulder arthroplasty  Patient Location: PACU  Anesthesia Type:GA combined with regional for post-op pain  Level of Consciousness: awake, oriented, sedated and patient cooperative  Airway and Oxygen Therapy: Patient Spontanous Breathing  Post-op Pain: mild  Post-op Assessment: Post-op Vital signs reviewed, Patient's Cardiovascular Status Stable, Respiratory Function Stable, Patent Airway, No signs of Nausea or vomiting and Pain level controlled  Post-op Vital Signs: stable  Last Vitals:  Filed Vitals:   12/05/13 0700  BP: 90/43  Pulse: 87  Temp: 36.9 C  Resp: 16    Complications: No apparent anesthesia complications

## 2013-12-05 NOTE — Progress Notes (Signed)
Patient's sister expressed to nurse that patient's husband was given home prescriptions to patient. This was not witnessed by staff and patient's sister did not know exactly what husband gave patient. Patient has been asleep majority of today and has refused to get up and move with PT, OT, and nursing staff. Discussed risks with family and patient and they stated understanding. Patient stated, "I need my rest to heal." No narcotics were administered because of patient's sedated state and for safety purposes. Patient has a history of overdosing earlier this year and goes to a pain clinic for pain management ever since. Staff has placed patient on camera to watch patient and patient's family.

## 2013-12-05 NOTE — Progress Notes (Signed)
Patient and husband informed me of the visit here in April, where she had and overdose of Morphine.  Husband stated she was getting 300 mg of Morphine per day for her pain.  She has neuropathy in her legs and feet and only gets out of bed to use the bathroom, otherwise patient is bedridden.  She is seen by the pain clinic in [facility].

## 2013-12-05 NOTE — Progress Notes (Signed)
   PATIENT ID: Javier Glazier   1 Day Post-Op Procedure(s): REVERSE SHOULDER ARTHROPLASTY  Subjective: Some discomfort in right shoulder. Patient reports that she does not want to get out of bed until tomorrow/ does not want to participate in PT/OT until tomorrow. Husband reports she slept well overnight.   Objective:  Filed Vitals:   12/05/13 0700  BP: 90/43  Pulse: 87  Temp: 98.5 F (36.9 C)  Resp: 16     R UE dressing c/d/i R arm in sling Hand swollen Wiggles fingers, distally NVI  Labs:  No results found for this basename: HGB,  in the last 72 hoursNo results found for this basename: WBC, RBC, HCT, PLT,  in the last 72 hoursNo results found for this basename: NA, K, CL, CO2, BUN, CREATININE, GLUCOSE, CALCIUM,  in the last 72 hours  Assessment and Plan: 1 day s/p  Reverse right TSA D/c foley cath today, talked with patient that she will need to use bedpan or get up to go to the bathroom Discussed the importance of getting up out of bed in the days following surgery, PT ordered Will work with OT today on hand, wrist elbow rom Likely stay until at least tomorrow Cbc not back yet, will monitor hgb when labs get back, possible transfusion needed today D/c home, she is on methadone and percocet 10/325 and will continue taking that at home for pain control. She is in pain mgmt following overdose episode earlier this year and we will defer further pain mgmt to them. Okay to increase freq of percocet from q 8 to q 4 prn but no scripts will be given  VTE proph: asa 325mg  BID, scds

## 2013-12-05 NOTE — Progress Notes (Signed)
NT, Colletta Maryland, tried to get patient's vital signs and patient refuse, stating that we did not do this last time she was here.  When she was last here in April, it was for Morphine overdose, per husband.  Per Colletta Maryland, husband asked her to let the NT take her blood pressure, but she did not want it done.  I will report this to the nurse in the 7:00 - 19:00 shift.

## 2013-12-05 NOTE — Progress Notes (Signed)
PT Cancellation Note  Patient Details Name: Uriel Horkey MRN: [id number] DOB: 1930-07-13   Cancelled Treatment:    Reason Eval/Treat Not Completed: Patient declined, no reason specified;Patient's level of consciousness.  Pt. Appears sedated but mumbles when spoken to .  Pt's daughter in room and was very opposed to PT trying to initiate mobility with pt.  Daughter states "I don't know why medical professionals think all people are alike.  She stayed in the bed for three days following her last shoulder surgery."    Attempted to explain the risk vs benefit of early initiation of PT.  Daughter stated, "you can try what you want to.  I am going to leave the room".  Attempted to awaken pt. For mobility.  When I asked pt. If she was willing to try to mobilize a little , she stated, " I just want to lay here for a few days to get well".  Pt. Did not seem to understand the need for early mobility either.  Pt's RN Selena Batten aware of family and pt. Refusal of PT.  Will attempt to eval tomorrow.       Ladona Ridgel 12/05/2013, 3:08 PM  Gerlean Ren PT Acute Rehab Services [phone] [place] [phone]

## 2013-12-06 LAB — CBC
HEMATOCRIT: 20.7 % — AB (ref 36.0–46.0)
Hemoglobin: 7 g/dL — ABNORMAL LOW (ref 12.0–15.0)
MCH: 31.8 pg (ref 26.0–34.0)
MCHC: 33.8 g/dL (ref 30.0–36.0)
MCV: 94.1 fL (ref 78.0–100.0)
Platelets: 161 10*3/uL (ref 150–400)
RBC: 2.2 MIL/uL — AB (ref 3.87–5.11)
RDW: 14.5 % (ref 11.5–15.5)
WBC: 8.6 10*3/uL (ref 4.0–10.5)

## 2013-12-06 LAB — URINE CULTURE
Colony Count: NO GROWTH
Culture: NO GROWTH

## 2013-12-06 LAB — PREPARE RBC (CROSSMATCH)

## 2013-12-06 MED ORDER — SODIUM CHLORIDE 0.9 % IV SOLN: Freq: Once | INTRAVENOUS | Status: DC

## 2013-12-06 NOTE — Progress Notes (Signed)
Patient BP 82/35 asymptomatic, sleeping well d/t increase in mobility this morning. Bilateral lower extremity edema unchanged.  PA Kayla McKenzie notifed, per verbal, monitor patient and decrease rate of PRBC to 81ml/hr. Patient is now sitting up in bed, eating dinner.

## 2013-12-06 NOTE — Progress Notes (Signed)
    Patient 2 Days PO R Rev Shoulder arthroplasty Pt still refusing to ambulate or be active, will arise to use bedside commode but has refused PT and all other activity, states she needs 3 days to rest. Describes moderate pain in R shoulder, has not yet had am px  Meds. Reports lethargy and sleeping easily but denies SOB. Has already been informed regarding Hg and is awae she will be receiving blood.  BP 89/46  Pulse 81  Temp(Src) 97.5 F (36.4 C) (Oral)  Resp 16  Ht 5' (1.524 m)  Wt 79.833 kg (176 lb)  BMI 34.37 kg/m2  SpO2 96%  Dressing in place, moderate swelling about R hand, sling is malpositioned and hand is hanging quite low, 2+ DRP = BIL, cap refill <2sec. Afebrile, pt laying comfortably in hospital bed. NVI. Full ROM in R wrist and fingers  CBC Latest Ref Rng 12/06/2013 12/05/2013 11/26/2013  WBC 4.0 - 10.5 K/uL 8.6 9.7 7.9  Hemoglobin 12.0 - 15.0 g/dL 7.0(L) 8.3(L) 12.1  Hematocrit 36.0 - 46.0 % 20.7(L) 24.0(L) 37.1  Platelets 150 - 400 K/uL 161 182 265    POD #2 s/p R Rev Shoulder per Tamera Punt  - Acute blood loss anemia from sx, will transfuse 2U today, orders placed - Cont to discuss importance of ambulation and getting out of the bed, pt continues to refuse, was educated on risks - OT for hand/wrist/elbow, encourage ambulation - D/C home on methadone and percocet through px clinic, She is in pain mgmt following overdose episode earlier this year and we will defer further pain mgmt to them. Okay to increase freq of percocet from q 8 to q 4 prn but no scripts will be given VTE proph: asa 325mg  BID, scds - likely d/c home tomorrow

## 2013-12-06 NOTE — Progress Notes (Signed)
PT Cancellation Note  Patient Details Name: Monique Russo MRN: [id number] DOB: 1931-01-22   Cancelled Treatment:    Reason Eval/Treat Not Completed: Patient's level of consciousness;Patient declined, no reason specified PT eval attempted x 2 today.  Pt just back in bed with nsg assist on first attempt.  Pt sleeping heavily on second attempt and unable to fully arouse.  RN was beginning to start her second unit of blood.  PT to re-attempt tomorrow AM.   Lorriane Shire 12/06/2013, 3:53 PM

## 2013-12-06 NOTE — Progress Notes (Signed)
OT Cancellation Note  Patient Details Name: Bindi Klomp MRN: [id number] DOB: 02-Nov-1930   Cancelled Treatment:    Reason Eval/Treat Not Completed: Fatigue/lethargy limiting ability to participate.  Pt had just fallen asleep, and daughter requesting therapy not wake her up. Will return tomorrow.  12/06/2013 Darrol Jump OTR/L Pager [phone] Office [phone]

## 2013-12-06 NOTE — Progress Notes (Signed)
Patient BP = 84/36 and retake BP = 89/46.  Hemoglobin level = 7.0.  Patient alert and oriented, asymptomatic and denies pain at this time. On call ,  Pricilla Holm, PA contacted.  Orders to transfuse 2 units of blood.  [facility] RN notified, orders entered.  Lab contacted; type and screen is still valid.  Will continue to monitor.

## 2013-12-07 LAB — TYPE AND SCREEN
ABO/RH(D): A NEG
Antibody Screen: NEGATIVE
UNIT DIVISION: 0
UNIT DIVISION: 0

## 2013-12-07 LAB — CBC
HCT: 28.5 % — ABNORMAL LOW (ref 36.0–46.0)
HEMOGLOBIN: 9.6 g/dL — AB (ref 12.0–15.0)
MCH: 31.2 pg (ref 26.0–34.0)
MCHC: 33.7 g/dL (ref 30.0–36.0)
MCV: 92.5 fL (ref 78.0–100.0)
PLATELETS: 192 10*3/uL (ref 150–400)
RBC: 3.08 MIL/uL — AB (ref 3.87–5.11)
RDW: 17.4 % — ABNORMAL HIGH (ref 11.5–15.5)
WBC: 15.6 10*3/uL — AB (ref 4.0–10.5)

## 2013-12-07 MED ORDER — MAGNESIUM HYDROXIDE 400 MG/5ML PO SUSP
5.0000 mL | Freq: Every day | ORAL | Status: DC | PRN
  Administered 2013-12-07: 5 mL via ORAL
  Filled 2013-12-07: qty 30

## 2013-12-07 NOTE — Progress Notes (Signed)
Occupational Therapy Treatment Patient Details Name: Monique Russo MRN: [id number] DOB: 1931/01/02 Today's Date: 12/07/2013    History of present illness Right reverse total shoulder; PMHx: OA, depression, CHF, left shoulder sx   OT comments  Pt seen today for functional mobility and ADLs. Pt has not been OOB yet with therapy since her surgery. Pt required max encouragement to get OOB this date and verbalizes desire to go home. Pt required +2 min (A) for transfers and was unable to ambulate short distances. Feel that level of care is too great for husband to manage at home and OT is strongly recommending that pt d/c to [place] at [facility].    Follow Up Recommendations  SNF;Supervision/Assistance - 24 hour    Equipment Recommendations  None recommended by OT    Recommendations for Other Services      Precautions / Restrictions Precautions Precautions: Fall;Shoulder Type of Shoulder Precautions: No shoulder ROM (gentle PROM for abduction during ADLs only) Shoulder Interventions: Shoulder sling/immobilizer Required Braces or Orthoses: Sling Restrictions Weight Bearing Restrictions: Yes RUE Weight Bearing: Non weight bearing Other Position/Activity Restrictions: Per OT note, MD approved gentle PROM abduction for ADL completion       Mobility Bed Mobility Overal bed mobility: Needs Assistance;+2 for physical assistance Bed Mobility: Supine to Sit     Supine to sit: Mod assist;+2 for safety/equipment;HOB elevated (use of bedrail)     General bed mobility comments: Pt required assistance to manage LEs off bed and to elevate trunk to sit on Rt side of bed (as she would at home).   Transfers Overall transfer level: Needs assistance Equipment used: 2 person hand held assist Transfers: Sit to/from [employer] Sit to Stand: Min assist;+2 physical assistance Stand pivot transfers: Min assist;+2 physical assistance                ADL Overall ADL's : Needs  assistance/impaired     Grooming: Wash/dry hands;Minimal assistance;Standing (+2 for safety and to guard RUE to prevent pt from using it )                   Toilet Transfer: Minimal assistance;+2 for physical assistance;Stand-pivot;BSC             General ADL Comments: Pt has been declining therapy for several days. Pt required max encouragement to get OOB this date. She stated that she wanted to go home and did not want to do therapy. Explained to pt that to be d/c she must work with PT/OT to determine safety and level of care required. Pt ultimately agreed, however she required +2 min (A) for stand pivot transfers and was unable to ambulate beyond the transfers. Pt is essentially at Total A for ADLs due to pain and limited mobility. Discussed extensively between PT/OT and pt and husband the level of care pt requires, including physical assistance and 24/7 Supervision. Highly recommended SNF at this time, as pt's husband is older and has health issues.                 Cognition  Arousal/Alertness: Awake/ Alert Behavior During Therapy: WFL for tasks assessed/performed;Flat affect Overall Cognitive Status: Within Functional Limits for tasks assessed                         Exercises Other Exercises Other Exercises: Pt performed elbow, wrist, and hand with mod-max (A) from therapist. Pt reluctant to do exercises and reports pain following ther ex  in shoulder and groaning throughout. Encouraged pt to perform elbow AROM but pt did not attempt to assist. Hsuband reports poor compliance and states that she has been through rehab before and "hates therapy."  ROM for elbow, wrist and digits of operated UE: Caregiver independent with task   Shoulder Instructions Shoulder Instructions ROM for elbow, wrist and digits of operated UE: Caregiver independent with task          Pertinent Vitals/ Pain       Pain Assessment: Faces Faces Pain Scale: Hurts whole lot Pain  Location: Rt leg; Rt shoulder post elbow,wrist,hand exercises Pain Intervention(s): Limited activity within patient's tolerance;Monitored during session;Repositioned         Frequency Min 3X/week     Progress Toward Goals  OT Goals(current goals can now be found in the care plan section)  Progress towards OT goals: Not progressing toward goals - comment  Acute Rehab OT Goals Patient Stated Goal: to go home ADL Goals Pt Will Perform Upper Body Bathing: with caregiver independent in assisting;sitting;bed level;standing Pt Will Perform Upper Body Dressing: with caregiver independent in assisting;sitting;standing;bed level Pt Will Transfer to Toilet: with min assist;ambulating;bedside commode Pt/caregiver will Perform Home Exercise Program: With written HEP provided  Plan Discharge plan needs to be updated    Co-evaluation    PT/OT/SLP Co-Evaluation/Treatment: Yes Reason for Co-Treatment: For patient/therapist safety   OT goals addressed during session: ADL's and self-care      End of Session Equipment Utilized During Treatment: Gait belt   Activity Tolerance Patient limited by pain   Patient Left in chair;with call bell/phone within reach;with family/visitor present   Nurse Communication Other (comment) (updating d/c rec)        Time: 0174-9449 OT Time Calculation (min): 50 min  Charges: OT General Charges $OT Visit: 1 Procedure OT Treatments $Self Care/Home Management : 8-22 mins $Therapeutic Exercise: 8-22 mins  Juluis Rainier [phone] 12/07/2013, 1:37 PM

## 2013-12-07 NOTE — Progress Notes (Signed)
Pt 3 days PO R Rev Sh per Dr Tamera Punt Pt reports shoulder aches but is tolerable, she has continued to refuse therapy and has been immobile and has not demonstrated the ability to progress home however she is adamant about not going to SNF. She has been eating but very little. NL B/B function, She received 2U blood yesterday and is feeling much better, denies SOB, she feels somewhat tired but states it is from the robaxin she was given and "Wishes she had not taken it"  BP 127/60  Pulse 89  Temp(Src) 97.6 F (36.4 C) (Oral)  Resp 18  Ht 5' (1.524 m)  Wt 79.833 kg (176 lb)  BMI 34.37 kg/m2  SpO2 98%  Pt sitting up in chair husband at side, appears comfortable but tired. Dressing CDI, moderate swelling about R hand, 2+ DRP, full ROM fingers/wrist/elbow. Sling in place. NVI  CBC Latest Ref Rng 12/07/2013 12/06/2013 12/05/2013  WBC 4.0 - 10.5 K/uL 15.6(H) 8.6 9.7  Hemoglobin 12.0 - 15.0 g/dL 9.6(L) 7.0(L) 8.3(L)  Hematocrit 36.0 - 46.0 % 28.5(L) 20.7(L) 24.0(L)  Platelets 150 - 400 K/uL 192 161 182   PO Day 3 S/P R Rev Sh per Tamera Punt   -Pt adamant about not going to SNF however she has refused therapy since surgery and has demonstrated limited mobility. Husband does not feel she can go home and he does not feel he can take care of her in her current state. She repeatedly states "Once this robaxin wears off I will be fine and move around fine" I informed her we would give her 1 more shot at PT this afternoon and have her evaluated for SNF and if she passes she can go home however if she continues to not progress or demonstrate mobility she will need to go to SNF, social work consulted for eval   -Acute blood loss anemia resolved after 2U given yesterday, Hg up from 7.0 to 9.6 -1 more session of PT this afternoon to offer pt chance to progress towards home D/C   If pt demonstrates ability to ambulate and use walker and passes for home release    She can be d/c'd home after PT today  -PT/OT for  hand wrist and elbow, educate on how to utilize walker with     minimal/NWB of R arm -Cont current px regimen, no meds at d/c will be picked back up by pain clinic upon d/c  -She is in pain mgmt following overdose episode earlier this year and we will   defer further pain mgmt to them - VTE prophylaxis ASA 325mg  BID, SCD's  - Milk of Magnesia for occasional constipation  - Change dressing to mepalex - D/C home today if passes PT/OT, D/C to SNF if fails, social work consulted for eval

## 2013-12-07 NOTE — Evaluation (Signed)
Physical Therapy Evaluation Patient Details Name: Monique Russo MRN: [id number] DOB: 1930/10/02 Today's Date: 12/07/2013   History of Present Illness  Right reverse total shoulder; PMHx: OA, depression, CHF, left shoulder sx  Clinical Impression  Pt admitted for above. Pt currently with functional limitations due to the deficits listed below (see PT Problem List). +2 mod assist required for bed mobility, +2 min assist required for transfers and +2 min assist required for hand-held ambulation 10 feet. PTA pt used a RW for ambulation which is not an option with the sx she underwent and NWB status RUE. Pt will benefit from skilled PT to increase their independence and safety with mobility to allow discharge to the venue listed below. Pt is adamant about not going to a SNF for further therapy.  Pt's spouse present during eval and in agreement that ST SNF stay is the best option as he is not able to provide the level of care needed.        Follow Up Recommendations SNF    Equipment Recommendations  None recommended by PT    Recommendations for Other Services       Precautions / Restrictions Precautions Precautions: Fall;Shoulder Type of Shoulder Precautions: No shoulder ROM (gentle PROM for abduction during ADLs only) Shoulder Interventions: Shoulder sling/immobilizer Required Braces or Orthoses: Sling Restrictions Weight Bearing Restrictions: Yes RUE Weight Bearing: Non weight bearing Other Position/Activity Restrictions: Per OT note, MD approved gentle PROM abduction for ADL completion      Mobility  Bed Mobility Overal bed mobility: Needs Assistance;+2 for physical assistance Bed Mobility: Supine to Sit     Supine to sit: Mod assist;+2 for safety/equipment;HOB elevated     General bed mobility comments: continuous verbal cues for sequencing; assist to manage BLEs and elevate trunk to sit  Transfers Overall transfer level: Needs assistance Equipment used: 2 person hand  held assist Transfers: Sit to/from [employer] Transfers Sit to Stand: Min assist;+2 physical assistance Stand pivot transfers: Min assist;+2 physical assistance       General transfer comment: verbal cues for technique and limit use of RUE  Ambulation/Gait Ambulation/Gait assistance: +2 physical assistance;Min assist Ambulation Distance (Feet): 10 Feet (x 2) Assistive device: 2 person hand held assist Gait Pattern/deviations: Shuffle   Gait velocity interpretation: <1.8 ft/sec, indicative of risk for recurrent falls    Stairs            Wheelchair Mobility    Modified Rankin (Stroke Patients Only)       Balance                                             Pertinent Vitals/Pain Pain Assessment: Faces Faces Pain Scale: Hurts whole lot Pain Location: Rt leg; Rt shoulder post elbow,wrist,hand exercises Pain Intervention(s): Limited activity within patient's tolerance;Monitored during session;Repositioned    Home Living Family/patient expects to be discharged to:: Private residence Living Arrangements: Spouse/significant other Available Help at Discharge: Family;Available 24 hours/day Type of Home: House Home Access: Stairs to enter   [employer] of Steps: 1 Home Layout: One level Home Equipment: Bedside commode;Wheelchair - power;Walker - 2 wheels;Wheelchair - Banker      Prior Function Level of Independence: Needs assistance   Gait / Transfers Assistance Needed: min guard A with RW bedroom to dining room  ADL's / Homemaking Assistance Needed: min guard A BSC, A with  bathing and dressing  Comments: Pta pt got up to [facility] and to eat meals, but otherwise was in the bed     Hand Dominance   Dominant Hand: Right    Extremity/Trunk Assessment   Upper Extremity Assessment: Defer to OT evaluation           Lower Extremity Assessment: Generalized weakness         Communication   Communication: No  difficulties  Cognition Arousal/Alertness: Awake/alert Behavior During Therapy: WFL for tasks assessed/performed Overall Cognitive Status: Within Functional Limits for tasks assessed                      General Comments      Exercises Other Exercises Other Exercises: Pt performed elbow, wrist, and hand with mod-max (A) from therapist. Pt reluctant to do exercises and reports pain following ther ex in shoulder and groaning throughout. Encouraged pt to perform elbow AROM but pt did not attempt to assist. Hsuband reports poor compliance and states that she has been through rehab before and "hates therapy."  ROM for elbow, wrist and digits of operated UE: Caregiver independent with task      Assessment/Plan    PT Assessment Patient needs continued PT services  PT Diagnosis Difficulty walking;Generalized weakness;Acute pain   PT Problem List Decreased strength;Decreased activity tolerance;Decreased balance;Decreased mobility;Decreased knowledge of precautions;Decreased safety awareness;Pain  PT Treatment Interventions DME instruction;Gait training;Functional mobility training;Therapeutic activities;Therapeutic exercise;Balance training;Patient/family education   PT Goals (Current goals can be found in the Care Plan section) Acute Rehab PT Goals Patient Stated Goal: to go home PT Goal Formulation: With patient/family Time For Goal Achievement: 12/14/13 Potential to Achieve Goals: Good    Frequency Min 5X/week   Barriers to discharge        Co-evaluation   Reason for Co-Treatment: For patient/therapist safety   OT goals addressed during session: ADL's and self-care       End of Session Equipment Utilized During Treatment: Gait belt Activity Tolerance: Patient limited by pain;Patient limited by fatigue Patient left: in chair;with family/visitor present;with call bell/phone within reach Nurse Communication: Mobility status         Time: 1105-1150 PT Time  Calculation (min): 45 min   Charges:   PT Evaluation $Initial PT Evaluation Tier I: 1 Procedure PT Treatments $Gait Training: 8-22 mins   PT G Codes:          Lorriane Shire 12/07/2013, 3:37 PM

## 2013-12-07 NOTE — Progress Notes (Signed)
Patient complains of constipation. Patient offered Miralax but refused, she states that she will only take [facility].

## 2013-12-08 ENCOUNTER — Encounter (HOSPITAL_COMMUNITY): Payer: Self-pay | Admitting: Orthopedic Surgery

## 2013-12-08 MED ORDER — DOCUSATE SODIUM 100 MG PO CAPS: 100.0000 mg | ORAL_CAPSULE | Freq: Three times a day (TID) | ORAL | Status: DC | PRN

## 2013-12-08 MED ORDER — OXYCODONE-ACETAMINOPHEN 5-325 MG PO TABS: 1.0000 | ORAL_TABLET | ORAL | Status: DC | PRN

## 2013-12-08 NOTE — Clinical Social Work Psychosocial (Signed)
Clinical Social Work Department BRIEF PSYCHOSOCIAL ASSESSMENT 12/08/2013  Patient:  Monique Russo, Monique Russo     Account Number:  [id number]     Admit date:  12/04/2013  Clinical Social Worker:  Wylene Men  Date/Time:  12/08/2013 12:29 PM  Referred by:  Physician  Date Referred:  12/08/2013 Referred for  SNF Placement  Psychosocial assessment   Other Referral:   none   Interview type:  Other - See comment Other interview type:   Pt husband, Monique Russo [phone]    PSYCHOSOCIAL DATA Living Status:  HUSBAND Admitted from facility:   Level of care:  [place] Primary support name:  Monique Russo Primary support relationship to patient:  SPOUSE Degree of support available:   adequate    CURRENT CONCERNS Current Concerns  Post-Acute Placement   Other Concerns:   none    SOCIAL WORK ASSESSMENT / PLAN CSW spoke with pt husband who states that PT has recommended SNF upon dc.  Pt would rather go home, but is aware that for safety reasons, SNF is the recommended option.  Husband states that he cannot care for her at home.  Husband's first choice is Monique Russo.  Pt has [employer] and [employer] has been contacted to inquire which type of [employer] pt has to determine authorization process.    Pt is hopeful regarding pt's progress and prognosis.  Pt husband states he hates that pt has to go to SNF, but does agree with MD recommendation.  Pt and husband aware the projection is STR only.   Assessment/plan status:  Psychosocial Support/Ongoing Assessment of Needs Other assessment/ plan:   FL2  PASARR   Information/referral to community resources:   SNF    PATIENT'S/FAMILY'S RESPONSE TO PLAN OF CARE: Pt and husband were thankful for CSW assistance and support.       Nonnie Done, [place] [phone]  Psychiatric & Orthopedics (5N 1-16) Clinical Social Worker

## 2013-12-08 NOTE — Progress Notes (Signed)
Occupational Therapy Treatment Patient Details Name: Monique Russo MRN: [id number] DOB: 1930-06-15 Today's Date: 12/08/2013    History of present illness Right reverse total shoulder; PMHx: OA, depression, CHF, left shoulder sx   OT comments  Pt seen today with focus only on ROM for RUE. AROM improving for elbow-distally while supine  Follow Up Recommendations  SNF;Supervision/Assistance - 24 hour    Equipment Recommendations  None recommended by OT       Precautions / Restrictions Precautions Precautions: Fall;Shoulder Type of Shoulder Precautions: No shoulder ROM (gentle PROM for abduction during ADLs only) Shoulder Interventions: Shoulder sling/immobilizer Required Braces or Orthoses: Sling Restrictions Weight Bearing Restrictions: Yes RUE Weight Bearing: Non weight bearing Other Position/Activity Restrictions: Per OT note, MD approved gentle PROM abduction for ADL completion                              Cognition   Behavior During Therapy: WFL for tasks assessed/performed Overall Cognitive Status: Within Functional Limits for tasks assessed                         Exercises Other Exercises Other Exercises: Pt performed elbow, wrist, and hand with min (A) from therapist 10 reps of each. Pt moving her LUE elbow to hand better than last week when I saw her. I demonstrated to daughter how to passively and gently move her RUE away from her body (abduction) for ADLs.           Pertinent Vitals/ Pain       Pain Assessment: No/denies pain Pain Score: 0-No pain         Frequency Min 5X/week (for arm only, 2x week for other)     Progress Toward Goals  OT Goals(current goals can now be found in the care plan section)  Progress towards OT goals: Progressing toward goals (moving arm better (elbow distally while supine))     Plan Discharge plan remains appropriate          Activity Tolerance Patient tolerated treatment well   Patient Left  in bed;with call bell/phone within reach           Time: 1047-1105 OT Time Calculation (min): 18 min  Charges: OT General Charges $OT Visit: 1 Procedure OT Treatments $Therapeutic Exercise: 8-22 mins  Almon Register [phone] 12/08/2013, 11:26 AM

## 2013-12-08 NOTE — Progress Notes (Signed)
   PATIENT ID: Monique Russo   4 Days Post-Op Procedure(s): REVERSE SHOULDER ARTHROPLASTY  Subjective: Reports soreness in right shoulder but pain minimal. Continues to not want to go to SNF but understanding of concern for safety. No other complaints or concerns.   Objective:  Filed Vitals:   12/08/13 0437  BP: 111/45  Pulse: 72  Temp: 98.5 F (36.9 C)  Resp: 18     Awake,alert, orientated R UE dressing c/d/i Wiggles fingers, distally NVI  Labs:   Recent Labs  12/06/13 0526 12/07/13 0435  HGB 7.0* 9.6*   Recent Labs  12/06/13 0526 12/07/13 0435  WBC 8.6 15.6*  RBC 2.20* 3.08*  HCT 20.7* 28.5*  PLT 161 192  No results found for this basename: NA, K, CL, CO2, BUN, CREATININE, GLUCOSE, CALCIUM,  in the last 72 hours  Assessment and Plan: 4 days s/p right reverse TSA Anticipate d/c to SNF today pending placement Continue PT/OT tx to get her out of bed while here Script for percocet 5/325 signed in chart for pain control, continue methadone home rx ABLA- expected post op, improved after 2 units packed RBC over the weekend Follow up with Dr. Tamera Punt in 2 weeks   VTE proph: ASA 325mg  BID, scds

## 2013-12-08 NOTE — Progress Notes (Signed)
Bed offers given to pt and her husband.  Pt prefers [facility] vs. [facility].  Unit CSW will continue to follow and facilitate NH tx as appropriate.

## 2013-12-08 NOTE — Clinical Social Work Placement (Signed)
Clinical Social Work Department CLINICAL SOCIAL WORK PLACEMENT NOTE 12/08/2013  Patient:  ASHLY, YEPEZ  Account Number:  [id number] Admit date:  12/04/2013  Clinical Social Worker:  Wylene Men  Date/time:  12/08/2013 12:34 PM  Clinical Social Work is seeking post-discharge placement for this patient at the following level of care:   SKILLED NURSING   (*CSW will update this form in Epic as items are completed)   12/08/2013  Patient/family provided with [facility] Department of Clinical Social Work's list of facilities offering this level of care within the geographic area requested by the patient (or if unable, by the patient's family).  12/08/2013  Patient/family informed of their freedom to choose among providers that offer the needed level of care, that participate in Medicare, Medicaid or managed care program needed by the patient, have an available bed and are willing to accept the patient.  12/08/2013  Patient/family informed of [facility]' ownership interest in [facility], as well as of the fact that they are under no obligation to receive care at this facility.  PASARR submitted to EDS on 12/08/2013 PASARR number received on 12/08/2013  FL2 transmitted to all facilities in geographic area requested by pt/family on  12/08/2013 FL2 transmitted to all facilities within larger geographic area on   Patient informed that his/her managed care company has contracts with or will negotiate with  certain facilities, including the following:     Patient/family informed of bed offers received:   Patient chooses bed at  Physician recommends and patient chooses bed at    Patient to be transferred to  on   Patient to be transferred to facility by  Patient and family notified of transfer on  Name of family member notified:    The following physician request were entered in Epic:   Additional Comments:  Nonnie Done, [place] [phone]  Psychiatric &  Orthopedics (5N 1-16) Clinical Social Worker

## 2013-12-08 NOTE — Clinical Social Work Note (Signed)
[employer] is silverback.  Representative has been notified that SNF authorization is needed.  Pt first choice is [facility].  Awaiting bed offers.  Nonnie Done, [place] [phone]  Psychiatric & Orthopedics (5N 1-16) Clinical Social Worker

## 2013-12-09 NOTE — Progress Notes (Signed)
   PATIENT ID: Monique Russo   5 Days Post-Op Procedure(s): REVERSE SHOULDER ARTHROPLASTY  Subjective: Feeling good, sitting in chair. Hopes to d/c today. No other complaints or concerns.   Objective:  Filed Vitals:   12/09/13 0510  BP: 110/48  Pulse: 65  Temp: 97.4 F (36.3 C)  Resp:      Awake,alert, orientated  R UE dressing c/d/i  Wiggles fingers, distally NVI   Labs:   Recent Labs  12/07/13 0435  HGB 9.6*   Recent Labs  12/07/13 0435  WBC 15.6*  RBC 3.08*  HCT 28.5*  PLT 192  No results found for this basename: NA, K, CL, CO2, BUN, CREATININE, GLUCOSE, CALCIUM,  in the last 72 hours  Assessment and Plan: 5 days s/p right reverse TSA  Anticipate d/c to SNF today pending placement  Continue PT/OT tx to get her out of bed while here  Script for percocet 5/325 signed in chart for pain control, continue methadone home rx  Follow up with Dr. Tamera Punt in 2 weeks  VTE proph: ASA 325mg  BID, scds

## 2013-12-09 NOTE — Progress Notes (Signed)
Physical Therapy Treatment Patient Details Name: Monique Russo MRN: [id number] DOB: 11/10/1930 Today's Date: 12/09/2013    History of Present Illness Right reverse total shoulder; PMHx: OA, depression, CHF, left shoulder sx    PT Comments    Patient hesitant to attempt any ambulation today but with some education and encouragement patient agreeable to walk into bathroom and back to bed. Patient stated that she had been up most of morning. Patient now planning to DC to SNF, hopefully later today  Follow Up Recommendations        Equipment Recommendations  None recommended by PT    Recommendations for Other Services       Precautions / Restrictions Precautions Precautions: Fall;Shoulder Type of Shoulder Precautions: No shoulder ROM (gentle PROM for abduction during ADLs only) Shoulder Interventions: Shoulder sling/immobilizer Restrictions Weight Bearing Restrictions: Yes RUE Weight Bearing: Non weight bearing Other Position/Activity Restrictions: Per OT note, MD approved gentle PROM abduction for ADL completion    Mobility  Bed Mobility Overal bed mobility: Needs Assistance;+2 for physical assistance Bed Mobility: Sit to Supine       Sit to supine: Mod assist;+2 for physical assistance   General bed mobility comments: continuous verbal cues for sequencing; assist to manage BLEs and shoulder control into lying  Transfers Overall transfer level: Needs assistance Equipment used: 2 person hand held assist Transfers: Sit to/from Stand Sit to Stand: Min assist;+2 physical assistance         General transfer comment: verbal cues for technique and limit use of RUE  Ambulation/Gait Ambulation/Gait assistance: Min assist;+2 physical assistance Ambulation Distance (Feet): 30 Feet Assistive device: 2 person hand held assist Gait Pattern/deviations: Trunk flexed;Shuffle   Gait velocity interpretation: <1.8 ft/sec, indicative of risk for recurrent falls      Stairs            Wheelchair Mobility    Modified Rankin (Stroke Patients Only)       Balance                                    Cognition Arousal/Alertness: Awake/alert Behavior During Therapy: WFL for tasks assessed/performed Overall Cognitive Status: Within Functional Limits for tasks assessed                      Exercises      General Comments        Pertinent Vitals/Pain      Home Living                      Prior Function            PT Goals (current goals can now be found in the care plan section) Progress towards PT goals: Progressing toward goals    Frequency  Min 5X/week    PT Plan Current plan remains appropriate    Co-evaluation             End of Session Equipment Utilized During Treatment: Gait belt Activity Tolerance: Patient tolerated treatment well Patient left: in bed;with call bell/phone within reach;with family/visitor present     Time: 0539-7673 PT Time Calculation (min): 24 min  Charges:  $Gait Training: 8-22 mins $Therapeutic Activity: 8-22 mins                    G Codes:      Jacqualyn Posey 12/09/2013, 9:46 AM  12/09/2013 Jacqualyn Posey PTA [phone] pager [phone] office

## 2013-12-09 NOTE — Progress Notes (Signed)
Occupational Therapy Treatment Patient Details Name: Monique Russo MRN: [id number] DOB: October 11, 1930 Today's Date: 12/09/2013    History of present illness Right reverse total shoulder; PMHx: OA, depression, CHF, left shoulder sx   OT comments  Pt seen for acute OT session. Pt limited this session by fatigue following PT session. Performed elbow, wrist, and hand ROM exercises as detailed below. Reviewed position of pillow, importance of exercises, and relaxing RUE into sling.    Follow Up Recommendations  SNF;Supervision/Assistance - 24 hour    Equipment Recommendations  None recommended by OT    Recommendations for Other Services      Precautions / Restrictions Precautions Precautions: Fall;Shoulder Type of Shoulder Precautions: No shoulder ROM (gentle PROM for abduction during ADLs only) Shoulder Interventions: Shoulder sling/immobilizer Required Braces or Orthoses: Sling Restrictions Weight Bearing Restrictions: Yes RUE Weight Bearing: Non weight bearing Other Position/Activity Restrictions: Per OT note, MD approved gentle PROM abduction for ADL completion       Mobility Bed Mobility  Transfers    Balance                                   ADL                                         General ADL Comments: Session limited to ROM exercises for elbow,wrist, and hand in supine. Pt had recently finished with PT and was fatigued. Reviewed position of pillow, importance of exercises, and relaxing RUE into sling. Per PT note pt still needing +2 physical assistance for transfers impacting ADL performance as well.        Vision                     Perception     Praxis      Cognition   Behavior During Therapy: [name] for tasks assessed/performed Overall Cognitive Status: Within Functional Limits for tasks assessed                       Extremity/Trunk Assessment               Exercises Other Exercises Other  Exercises: Pt performed elbow, wrist, and hand exercises in supine. Pt needed verbal and tactile cueing to extend elbow, wrist, hand, to full range, second to fatigue. 10 reps flexion/extension R elbow, wrist, and digits. No family present this session.   Shoulder Instructions       General Comments      Pertinent Vitals/ Pain       Pain Assessment: 0-10 Pain Score: 5  Pain Location: R shoulder Pain Descriptors / Indicators: Aching Pain Intervention(s): Limited activity within patient's tolerance;Monitored during session  Home Living                                          Prior Functioning/Environment              Frequency Min 5X/week     Progress Toward Goals  OT Goals(current goals can now be found in the care plan section)  Progress towards OT goals: Progressing toward goals  Acute Rehab OT Goals Patient Stated Goal: to go home OT Goal Formulation:  With patient Time For Goal Achievement: 12/12/13 Potential to Achieve Goals: Fair ADL Goals Pt Will Perform Upper Body Bathing: with caregiver independent in assisting;sitting;bed level;standing Pt Will Perform Upper Body Dressing: with caregiver independent in assisting;sitting;standing;bed level Pt Will Transfer to Toilet: with min assist;ambulating;bedside commode Pt/caregiver will Perform Home Exercise Program: With written HEP provided  Plan Discharge plan remains appropriate    Co-evaluation                 End of Session     Activity Tolerance Patient tolerated treatment well;Patient limited by fatigue   Patient Left in bed;with call bell/phone within reach   Nurse Communication          Time: 0388-8280 OT Time Calculation (min): 10 min  Charges: OT General Charges $OT Visit: 1 Procedure OT Treatments $Therapeutic Exercise: 8-22 mins  Hortencia Pilar 12/09/2013, 10:15 AM

## 2013-12-09 NOTE — Progress Notes (Addendum)
Rept given to Patty at [facility]. Pt transferred via wheelchair without incident to car for transport.

## 2013-12-09 NOTE — Plan of Care (Signed)
Problem: Discharge Progression Outcomes Goal: Negotiates stairs Outcome: Not Met (add Reason) Slow to progress

## 2013-12-09 NOTE — Discharge Summary (Signed)
Patient ID: Monique Russo MRN: [id number] DOB/AGE: Sep 28, 1930 80 y.o.  Admit date: 12/04/2013 Discharge date: 12/09/2013  Admission Diagnoses:  Active Problems:   Rotator cuff arthropathy   Discharge Diagnoses:  Same  Past Medical History  Diagnosis Date  . Arthritis   . CHF (congestive heart failure)   . Pneumonia   . Depression   . GERD (gastroesophageal reflux disease)     Surgeries: Procedure(s): REVERSE SHOULDER ARTHROPLASTY on 12/04/2013   Consultants:    Discharged Condition: Improved  Hospital Course: Monique Russo is an 80 y.o. female who was admitted 12/04/2013 for operative treatment of rotator cuff tear arthropathy. Patient has severe unremitting pain that affects sleep, daily activities, and work/hobbies. After pre-op clearance the patient was taken to the operating room on 12/04/2013 and underwent  Procedure(s): Sharkey.    Patient was given perioperative antibiotics: Anti-infectives   Start     Dose/Rate Route Frequency Ordered Stop   12/04/13 1800  ceFAZolin (ANCEF) IVPB 1 g/50 mL premix     1 g 100 mL/hr over 30 Minutes Intravenous Every 6 hours 12/04/13 1514 12/05/13 1159   12/04/13 0600  ceFAZolin (ANCEF) IVPB 2 g/50 mL premix     2 g 100 mL/hr over 30 Minutes Intravenous On call to O.R. 12/03/13 1406 12/04/13 1150       Patient was given sequential compression devices, early ambulation, and ASA 325mg  BID to prevent DVT.  ABLA post op- expected, transfused 2 units pack RBC with improvement. Hypotension improved with normal saline bolus. Patient benefited maximally from hospital stay and there were no complications.    Recent vital signs: Patient Vitals for the past 24 hrs:  BP Temp Temp src Pulse Resp SpO2  12/09/13 1051 - - - - - 91 %  12/09/13 0510 110/48 mmHg 97.4 F (36.3 C) Oral 65 - 97 %  12/08/13 2220 124/54 mmHg - - 71 - -  12/08/13 2134 84/40 mmHg 98.2 F (36.8 C) Oral 86 - 97 %  12/08/13 1516 118/54 mmHg  97.7 F (36.5 C) Oral 83 18 96 %     Recent laboratory studies:  Recent Labs  12/07/13 0435  WBC 15.6*  HGB 9.6*  HCT 28.5*  PLT 192     Discharge Medications:     Medication List    STOP taking these medications       oxyCODONE-acetaminophen 10-325 MG per tablet  Commonly known as:  PERCOCET  Replaced by:  oxyCODONE-acetaminophen 5-325 MG per tablet      TAKE these medications       acetaminophen 325 MG tablet  Commonly known as:  TYLENOL  Take 325 mg by mouth every 6 (six) hours as needed for mild pain or headache.     b complex vitamins tablet  Take 1 tablet by mouth daily.     bisacodyl 10 MG suppository  Commonly known as:  DULCOLAX  Place 1 suppository (10 mg total) rectally daily as needed for moderate constipation (May repeat times one).     diclofenac 0.1 % ophthalmic solution  Commonly known as:  VOLTAREN  4 (four) times daily.     diclofenac 50 MG EC tablet  Commonly known as:  VOLTAREN  Take 50 mg by mouth daily.     diclofenac sodium 1 % Gel  Commonly known as:  VOLTAREN  Apply topically 4 (four) times daily. 1 gm     docusate sodium 100 MG capsule  Commonly known as:  COLACE  Take 1 capsule (100 mg total) by mouth 3 (three) times daily as needed.     DULoxetine 30 MG capsule  Commonly known as:  CYMBALTA  Take 30 mg by mouth daily.     Fish Oil 1200 MG Caps  Take 2 capsules by mouth daily.     furosemide 80 MG tablet  Commonly known as:  LASIX  Take 80 mg by mouth daily.     gabapentin 400 MG capsule  Commonly known as:  NEURONTIN  Take 400 mg by mouth daily.     HM VITAMIN D3 2000 UNITS Caps  Generic drug:  Cholecalciferol  Take 1 capsule by mouth daily.     magnesium hydroxide 400 MG/5ML suspension  Commonly known as:  MILK OF MAGNESIA  Take 30 mLs by mouth daily as needed for mild constipation.     methadone 5 MG tablet  Commonly known as:  DOLOPHINE  Take 7.5 mg by mouth every 8 (eight) hours.     methocarbamol 750 MG  tablet  Commonly known as:  ROBAXIN  Take 750 mg by mouth 3 (three) times daily.     oxyCODONE-acetaminophen 5-325 MG per tablet  Commonly known as:  ROXICET  Take 1-2 tablets by mouth every 4 (four) hours as needed for severe pain.     potassium chloride SA 20 MEQ tablet  Commonly known as:  K-DUR,KLOR-CON  Take 20 mEq by mouth daily as needed (Only takes with lasix).     temazepam 30 MG capsule  Commonly known as:  RESTORIL  Take 1 capsule (30 mg total) by mouth at bedtime as needed for sleep.     timolol 0.5 % ophthalmic solution  Commonly known as:  BETIMOL  Place 1 drop into both eyes daily.     vitamin C 1000 MG tablet  Take 1,000 mg by mouth daily.     vitamin E 400 UNIT capsule  Take 800 Units by mouth daily.        Diagnostic Studies: Dg Chest 2 View  11/26/2013   CLINICAL DATA:  Preoperative shoulder arthroplasty ; gastroesophageal reflux  EXAM: CHEST  2 VIEW  COMPARISON:  July 04, 2013  FINDINGS: There is a sizable hiatal type hernia. There is persistent elevation of the right hemidiaphragm. There is patchy atelectasis in left mid lung and left base regions. Elsewhere lungs are clear. Heart size and pulmonary vascularity are normal. There is atherosclerotic change in the aorta. Bones are osteoporotic. Patient is status post total shoulder replacement on the left. There is arthropathy in the right shoulder.  IMPRESSION: There is atelectatic change in left mid lung and left base regions. No frank edema or consolidation. Sizable hiatal type hernia. Bones osteoporotic.   Electronically Signed   By: Lowella Grip M.D.   On: 11/26/2013 16:28   Dg Shoulder Right Port  12/04/2013   CLINICAL DATA:  Status post right shoulder total arthroplasty.  EXAM: PORTABLE RIGHT SHOULDER - 2+ VIEW  COMPARISON:  None.  FINDINGS: Status post right shoulder total arthroplasty. The glenoid and humeral components appear to be well situated. No fracture or dislocation is noted.  IMPRESSION:  Status post right shoulder arthroplasty.   Electronically Signed   By: Sabino Dick M.D.   On: 12/04/2013 15:52    Disposition: 03-Skilled Nursing Facility        Follow-up Information   Follow up with Nita Sells, MD. Schedule an appointment as soon as possible for a visit in 2 weeks.  Specialty:  Orthopedic Surgery   Contact information:   [place] [address] [phone]        Signed: Grier Mitts 12/09/2013, 12:40 PM

## 2014-02-11 ENCOUNTER — Other Ambulatory Visit: Payer: Self-pay | Admitting: Orthopedic Surgery

## 2014-02-11 DIAGNOSIS — M6530 Trigger finger, unspecified finger: Secondary | ICD-10-CM

## 2014-02-11 DIAGNOSIS — R52 Pain, unspecified: Secondary | ICD-10-CM

## 2014-02-24 ENCOUNTER — Ambulatory Visit
Admission: RE | Admit: 2014-02-24 | Discharge: 2014-02-24 | Disposition: A | Discharge status: Home / Self Care | Payer: Commercial Managed Care - HMO | Source: Ambulatory Visit | Attending: Orthopedic Surgery | Admitting: Orthopedic Surgery

## 2014-02-24 DIAGNOSIS — M6530 Trigger finger, unspecified finger: Secondary | ICD-10-CM

## 2014-02-24 DIAGNOSIS — R52 Pain, unspecified: Secondary | ICD-10-CM

## 2014-04-06 ENCOUNTER — Other Ambulatory Visit: Payer: Self-pay | Admitting: Orthopedic Surgery

## 2014-04-07 ENCOUNTER — Other Ambulatory Visit: Payer: Self-pay | Admitting: Orthopedic Surgery

## 2014-04-08 ENCOUNTER — Encounter (HOSPITAL_BASED_OUTPATIENT_CLINIC_OR_DEPARTMENT_OTHER): Payer: Self-pay | Admitting: *Deleted

## 2014-04-08 ENCOUNTER — Encounter (HOSPITAL_BASED_OUTPATIENT_CLINIC_OR_DEPARTMENT_OTHER)
Admission: RE | Admit: 2014-04-08 | Discharge: 2014-04-08 | Disposition: A | Discharge status: Home / Self Care | Payer: PPO | Source: Ambulatory Visit | Attending: Orthopedic Surgery | Admitting: Orthopedic Surgery

## 2014-04-08 DIAGNOSIS — S63202A Unspecified subluxation of right middle finger, initial encounter: Secondary | ICD-10-CM | POA: Diagnosis present

## 2014-04-08 DIAGNOSIS — Z01818 Encounter for other preprocedural examination: Secondary | ICD-10-CM | POA: Diagnosis not present

## 2014-04-08 DIAGNOSIS — M24541 Contracture, right hand: Secondary | ICD-10-CM | POA: Diagnosis not present

## 2014-04-08 DIAGNOSIS — K219 Gastro-esophageal reflux disease without esophagitis: Secondary | ICD-10-CM | POA: Diagnosis not present

## 2014-04-08 DIAGNOSIS — M67843 Other specified disorders of tendon, right hand: Secondary | ICD-10-CM | POA: Diagnosis not present

## 2014-04-08 DIAGNOSIS — F329 Major depressive disorder, single episode, unspecified: Secondary | ICD-10-CM | POA: Diagnosis not present

## 2014-04-08 DIAGNOSIS — S63091A Other subluxation of right wrist and hand, initial encounter: Secondary | ICD-10-CM | POA: Insufficient documentation

## 2014-04-08 DIAGNOSIS — Z96611 Presence of right artificial shoulder joint: Secondary | ICD-10-CM | POA: Diagnosis not present

## 2014-04-08 DIAGNOSIS — I509 Heart failure, unspecified: Secondary | ICD-10-CM | POA: Diagnosis not present

## 2014-04-08 DIAGNOSIS — M170 Bilateral primary osteoarthritis of knee: Secondary | ICD-10-CM | POA: Diagnosis not present

## 2014-04-08 LAB — BASIC METABOLIC PANEL
ANION GAP: 11 (ref 5–15)
BUN: 13 mg/dL (ref 6–23)
CALCIUM: 10.1 mg/dL (ref 8.4–10.5)
CO2: 30 mmol/L (ref 19–32)
Chloride: 92 mEq/L — ABNORMAL LOW (ref 96–112)
Creatinine, Ser: 0.76 mg/dL (ref 0.50–1.10)
GFR calc Af Amer: 89 mL/min — ABNORMAL LOW (ref >90)
GFR calc non Af Amer: 77 mL/min — ABNORMAL LOW (ref >90)
GLUCOSE: 109 mg/dL — AB (ref 70–99)
Potassium: 4.4 mmol/L (ref 3.5–5.1)
SODIUM: 133 mmol/L — AB (ref 135–145)

## 2014-04-08 NOTE — Progress Notes (Signed)
Husband and pt on phone-he takes care of her-very good historian-to bring her for ekg-bmet

## 2014-04-10 NOTE — H&P (Signed)
Monique Russo is an 81 y.o. female.   CC / Reason for Visit: Bilateral hand problems HPI: This patient is an 81 year old female with multiple problems, now status post bilateral shoulder arthroplasty with chronic back pain and bilateral knee osteoarthritis.  She is on methadone for chronic pain management, largely for her back and knees.  She has been noted to have a right ring finger locked in flexion for several months.  It was "triggering" prior to that time.  She also has some "triggering" on the left side.  Past Medical History  Diagnosis Date  . Arthritis   . CHF (congestive heart failure)   . Pneumonia   . Depression   . GERD (gastroesophageal reflux disease)     Past Surgical History  Procedure Laterality Date  . Abdominal hysterectomy    . Joint replacement Left     shoulder  . Reverse shoulder arthroplasty Right 12/04/2013  . Reverse shoulder arthroplasty  12/04/2013    Procedure: REVERSE SHOULDER ARTHROPLASTY;  Surgeon: Nita Sells, MD;  Location: [facility] OR;  Service: Orthopedics;;  Right reverse total shoulder arthroplasty  . Back surgery  2000  . Colon surgery      x2-blockage  . Hernia repair  2012  . Orif wrist fracture  2012    left  . Ercp    . Colonoscopy    . Rotator cuff repair      dr Tamera Punt  . Shoulder arthroscopy  2012    lt and rt    History reviewed. No pertinent family history. Social History:  reports that she has never smoked. She has never used smokeless tobacco. She reports that she drinks about 4.2 oz of alcohol per week. She reports that she does not use illicit drugs.  Allergies: No Known Allergies  No prescriptions prior to admission    Results for orders placed or performed during the hospital encounter of 04/13/14 (from the past 48 hour(s))  Basic metabolic panel     Status: Abnormal   Collection Time: 04/08/14  3:30 PM  Result Value Ref Range   Sodium 133 (L) 135 - 145 mmol/L    Comment: Please note change in reference  range.   Potassium 4.4 3.5 - 5.1 mmol/L    Comment: Please note change in reference range.   Chloride 92 (L) 96 - 112 mEq/L   CO2 30 19 - 32 mmol/L   Glucose, Bld 109 (H) 70 - 99 mg/dL   BUN 13 6 - 23 mg/dL   Creatinine, Ser 0.76 0.50 - 1.10 mg/dL   Calcium 10.1 8.4 - 10.5 mg/dL   GFR calc non Af Amer 77 (L) >90 mL/min   GFR calc Af Amer 89 (L) >90 mL/min    Comment: (NOTE) The eGFR has been calculated using the CKD EPI equation. This calculation has not been validated in all clinical situations. eGFR's persistently <90 mL/min signify possible Chronic Kidney Disease.    Anion gap 11 5 - 15   No results found.  Review of Systems  All other systems reviewed and are negative.   Height 5' (1.524 m), weight 79.379 kg (175 lb). Physical Exam  Constitutional:  WD, WN, NAD HEENT:  NCAT, EOMI Neuro/Psych:  Alert & oriented to person, place, and time; appropriate mood & affect Lymphatic: No generalized UE edema or lymphadenopathy Extremities / MSK:  Both UE are normal with respect to appearance, ranges of motion, joint stability, muscle strength/tone, sensation, & perfusion except as otherwise noted:  On  the right side, the ring finger is clenched into the palm.  Palpation of the palm reveals palpable appreciation of the flexor tendons, without findings consistent with Dupuytren's.  The ring finger and long fingers however are ulnarly angulated at the MP joints, and obviously flexed, as is often the case with rheumatoid disease.  The central extensor tendons are dislocated ulnarly into the intermetacarpal spaces, particularly as it relates to the long and ring fingers.  There is a PIP flexion contracture of the ring finger.  This deformity is not passively correctable initially.  On the left side, "triggering" is appreciated, as the central tendons dislocated from their centralized position and extension down into the intermetacarpal spaces affecting the long and ring fingers on the left  hand.  She is able to actively fully flex and extend his digits.  Labs / X-rays:  No radiographic studies obtained today.  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  On the left side, the same problems are present, but to a much lesser extent with full passive correctability.  On this side, the tendons can be helped centralized even with direct thumb pressure on them as the patient cycles into flexion and out of flexion.  Plan:  Median and ulnar nerve blocks at the wrist on the right side are rendered with lidocaine.  After the onset of the appropriate anesthesia, I was able to bring all of the fingers including the ring finger into extension.  There was still a small degree of flexion that persisted at both the MP and PIP joints, but much of the ulnar angulation was also corrected.  Coaptation splint was applied with fiberglass components both volarly and dorsally, up to the level of the distal forearm.  We will keep her stretched out like this pending surgery in a week to 10 days.  I discussed with them that surgery would consist on the right side of central tendon centralization, possibly with releases of tight structures and even temporary cross pinning of the affected MP joints.  We discussed the need for postoperative hand therapy to include custom splint fabrication and therapy visits.  Questions were invited and answered.  In addition, we discussed the likelihood of an upper extremity nerve block, possibly with sedation or even general anesthesia as needed for the performance of surgery, depending upon anesthesia's preferences.  The patient has Percocet on hand to be used in addition to her baseline methadone as needed for breakthrough pain, and we'll use this initially for her right hand as it has already been prescribed by her pain  management physician.  Tulip Meharg A. 04/10/2014, 12:35 PM

## 2014-04-13 ENCOUNTER — Ambulatory Visit (HOSPITAL_BASED_OUTPATIENT_CLINIC_OR_DEPARTMENT_OTHER): Payer: PPO | Admitting: Certified Registered"

## 2014-04-13 ENCOUNTER — Encounter (HOSPITAL_BASED_OUTPATIENT_CLINIC_OR_DEPARTMENT_OTHER)
Admission: RE | Disposition: A | Discharge status: Home / Self Care | Payer: Self-pay | Source: Ambulatory Visit | Attending: Orthopedic Surgery

## 2014-04-13 ENCOUNTER — Ambulatory Visit (HOSPITAL_BASED_OUTPATIENT_CLINIC_OR_DEPARTMENT_OTHER)
Admission: RE | Admit: 2014-04-13 | Discharge: 2014-04-13 | Disposition: A | Discharge status: Home / Self Care | Payer: PPO | Source: Ambulatory Visit | Attending: Orthopedic Surgery | Admitting: Orthopedic Surgery

## 2014-04-13 ENCOUNTER — Encounter (HOSPITAL_BASED_OUTPATIENT_CLINIC_OR_DEPARTMENT_OTHER): Payer: Self-pay | Admitting: Certified Registered"

## 2014-04-13 DIAGNOSIS — I509 Heart failure, unspecified: Secondary | ICD-10-CM | POA: Diagnosis not present

## 2014-04-13 DIAGNOSIS — K219 Gastro-esophageal reflux disease without esophagitis: Secondary | ICD-10-CM | POA: Insufficient documentation

## 2014-04-13 DIAGNOSIS — M24541 Contracture, right hand: Secondary | ICD-10-CM | POA: Diagnosis not present

## 2014-04-13 DIAGNOSIS — M67843 Other specified disorders of tendon, right hand: Secondary | ICD-10-CM | POA: Diagnosis not present

## 2014-04-13 DIAGNOSIS — M170 Bilateral primary osteoarthritis of knee: Secondary | ICD-10-CM | POA: Insufficient documentation

## 2014-04-13 DIAGNOSIS — Z96611 Presence of right artificial shoulder joint: Secondary | ICD-10-CM | POA: Insufficient documentation

## 2014-04-13 DIAGNOSIS — F329 Major depressive disorder, single episode, unspecified: Secondary | ICD-10-CM | POA: Insufficient documentation

## 2014-04-13 HISTORY — PX: TENDON REPAIR: SHX5111

## 2014-04-13 SURGERY — TENDON REPAIR
Anesthesia: General | Laterality: Right

## 2014-04-13 MED ORDER — LIDOCAINE HCL (PF) 1 % IJ SOLN
INTRAMUSCULAR | Status: AC
  Filled 2014-04-13: qty 30

## 2014-04-13 MED ORDER — CEFAZOLIN SODIUM-DEXTROSE 2-3 GM-% IV SOLR
INTRAVENOUS | Status: AC
  Filled 2014-04-13: qty 50

## 2014-04-13 MED ORDER — CEFAZOLIN SODIUM-DEXTROSE 2-3 GM-% IV SOLR
2.0000 g | INTRAVENOUS | Status: AC
  Administered 2014-04-13: 2 g via INTRAVENOUS

## 2014-04-13 MED ORDER — BUPIVACAINE HCL (PF) 0.5 % IJ SOLN
INTRAMUSCULAR | Status: AC
Start: 2014-04-13 — End: 2014-04-13
  Filled 2014-04-13: qty 30

## 2014-04-13 MED ORDER — FENTANYL CITRATE 0.05 MG/ML IJ SOLN
INTRAMUSCULAR | Status: AC
  Filled 2014-04-13: qty 2

## 2014-04-13 MED ORDER — HYDROMORPHONE HCL 1 MG/ML IJ SOLN: 0.2500 mg | INTRAMUSCULAR | Status: DC | PRN

## 2014-04-13 MED ORDER — PROPOFOL 10 MG/ML IV EMUL
INTRAVENOUS | Status: AC
  Filled 2014-04-13: qty 50

## 2014-04-13 MED ORDER — LACTATED RINGERS IV SOLN
INTRAVENOUS | Status: DC
  Administered 2014-04-13: 10:00:00 via INTRAVENOUS

## 2014-04-13 MED ORDER — BUPIVACAINE-EPINEPHRINE (PF) 0.5% -1:200000 IJ SOLN
INTRAMUSCULAR | Status: DC | PRN
  Administered 2014-04-13: 30 mL via PERINEURAL

## 2014-04-13 MED ORDER — ONDANSETRON HCL 4 MG/2ML IJ SOLN: 4.0000 mg | Freq: Once | INTRAMUSCULAR | Status: DC | PRN

## 2014-04-13 MED ORDER — 0.9 % SODIUM CHLORIDE (POUR BTL) OPTIME
TOPICAL | Status: DC | PRN
  Administered 2014-04-13: 100 mL

## 2014-04-13 MED ORDER — LACTATED RINGERS IV SOLN: INTRAVENOUS | Status: DC

## 2014-04-13 MED ORDER — PROPOFOL INFUSION 10 MG/ML OPTIME
INTRAVENOUS | Status: DC | PRN
  Administered 2014-04-13: 25 ug/kg/min via INTRAVENOUS

## 2014-04-13 MED ORDER — MEPERIDINE HCL 25 MG/ML IJ SOLN: 6.2500 mg | INTRAMUSCULAR | Status: DC | PRN

## 2014-04-13 MED ORDER — FENTANYL CITRATE 0.05 MG/ML IJ SOLN
50.0000 ug | INTRAMUSCULAR | Status: DC | PRN
  Administered 2014-04-13: 50 ug via INTRAVENOUS

## 2014-04-13 MED ORDER — FENTANYL CITRATE 0.05 MG/ML IJ SOLN
INTRAMUSCULAR | Status: AC
  Filled 2014-04-13: qty 6

## 2014-04-13 MED ORDER — BUPIVACAINE-EPINEPHRINE (PF) 0.5% -1:200000 IJ SOLN
INTRAMUSCULAR | Status: AC
  Filled 2014-04-13: qty 90

## 2014-04-13 MED ORDER — MIDAZOLAM HCL 2 MG/2ML IJ SOLN
1.0000 mg | INTRAMUSCULAR | Status: DC | PRN
  Administered 2014-04-13: 2 mg via INTRAVENOUS

## 2014-04-13 MED ORDER — LIDOCAINE HCL (CARDIAC) 20 MG/ML IV SOLN
INTRAVENOUS | Status: DC | PRN
  Administered 2014-04-13: 30 mg via INTRAVENOUS

## 2014-04-13 MED ORDER — MIDAZOLAM HCL 2 MG/2ML IJ SOLN
INTRAMUSCULAR | Status: AC
  Filled 2014-04-13: qty 2

## 2014-04-13 SURGICAL SUPPLY — 68 items
BLADE MINI RND TIP GREEN BEAV (BLADE) IMPLANT
BLADE SURG 15 STRL LF DISP TIS (BLADE) ×1 IMPLANT
BLADE SURG 15 STRL SS (BLADE) ×2
BNDG COHESIVE 1X5 TAN STRL LF (GAUZE/BANDAGES/DRESSINGS) ×3 IMPLANT
BNDG COHESIVE 4X5 TAN STRL (GAUZE/BANDAGES/DRESSINGS) ×3 IMPLANT
BNDG CONFORM 2 STRL LF (GAUZE/BANDAGES/DRESSINGS) ×3 IMPLANT
BNDG ESMARK 4X9 LF (GAUZE/BANDAGES/DRESSINGS) ×3 IMPLANT
BNDG GAUZE ELAST 4 BULKY (GAUZE/BANDAGES/DRESSINGS) ×6 IMPLANT
CHLORAPREP W/TINT 26ML (MISCELLANEOUS) ×3 IMPLANT
CORDS BIPOLAR (ELECTRODE) ×3 IMPLANT
COVER BACK TABLE 60X90IN (DRAPES) ×3 IMPLANT
COVER MAYO STAND STRL (DRAPES) ×3 IMPLANT
CUFF TOURNIQUET SINGLE 18IN (TOURNIQUET CUFF) ×3 IMPLANT
DECANTER SPIKE VIAL GLASS SM (MISCELLANEOUS) IMPLANT
DEPRESSOR TONGUE BLADE STERILE (MISCELLANEOUS) ×3 IMPLANT
DRAPE EXTREMITY T 121X128X90 (DRAPE) ×3 IMPLANT
DRAPE SURG 17X23 STRL (DRAPES) ×3 IMPLANT
DRSG EMULSION OIL 3X3 NADH (GAUZE/BANDAGES/DRESSINGS) ×3 IMPLANT
ELECT REM PT RETURN 9FT ADLT (ELECTROSURGICAL)
ELECTRODE REM PT RTRN 9FT ADLT (ELECTROSURGICAL) IMPLANT
GLOVE BIO SURGEON STRL SZ7.5 (GLOVE) ×3 IMPLANT
GLOVE BIOGEL PI IND STRL 7.0 (GLOVE) ×1 IMPLANT
GLOVE BIOGEL PI IND STRL 8 (GLOVE) ×1 IMPLANT
GLOVE BIOGEL PI INDICATOR 7.0 (GLOVE) ×2
GLOVE BIOGEL PI INDICATOR 8 (GLOVE) ×2
GLOVE ECLIPSE 6.5 STRL STRAW (GLOVE) ×3 IMPLANT
GOWN STRL REUS W/ TWL LRG LVL3 (GOWN DISPOSABLE) ×2 IMPLANT
GOWN STRL REUS W/TWL LRG LVL3 (GOWN DISPOSABLE) ×4
GOWN STRL REUS W/TWL XL LVL3 (GOWN DISPOSABLE) ×3 IMPLANT
LOOP VESSEL MAXI BLUE (MISCELLANEOUS) IMPLANT
LOOP VESSEL MINI RED (MISCELLANEOUS) IMPLANT
NEEDLE HYPO 25X1 1.5 SAFETY (NEEDLE) IMPLANT
NS IRRIG 1000ML POUR BTL (IV SOLUTION) ×3 IMPLANT
PACK BASIN DAY SURGERY FS (CUSTOM PROCEDURE TRAY) ×3 IMPLANT
PADDING CAST ABS 3INX4YD NS (CAST SUPPLIES)
PADDING CAST ABS 4INX4YD NS (CAST SUPPLIES) ×2
PADDING CAST ABS COTTON 3X4 (CAST SUPPLIES) IMPLANT
PADDING CAST ABS COTTON 4X4 ST (CAST SUPPLIES) ×1 IMPLANT
PENCIL BUTTON HOLSTER BLD 10FT (ELECTRODE) IMPLANT
RUBBERBAND STERILE (MISCELLANEOUS) IMPLANT
SLEEVE SCD COMPRESS KNEE MED (MISCELLANEOUS) IMPLANT
SLING ARM LRG ADULT FOAM STRAP (SOFTGOODS) IMPLANT
SPLINT FAST PLASTER 5X30 (CAST SUPPLIES)
SPLINT PLASTER CAST FAST 5X30 (CAST SUPPLIES) IMPLANT
SPLINT PLASTER CAST XFAST 3X15 (CAST SUPPLIES) IMPLANT
SPLINT PLASTER XTRA FASTSET 3X (CAST SUPPLIES)
SPONGE GAUZE 4X4 12PLY STER LF (GAUZE/BANDAGES/DRESSINGS) ×3 IMPLANT
STOCKINETTE 6  STRL (DRAPES) ×2
STOCKINETTE 6 STRL (DRAPES) ×1 IMPLANT
SUT ETHIBOND 3-0 V-5 (SUTURE) IMPLANT
SUT FIBERWIRE 2-0 18 17.9 3/8 (SUTURE)
SUT MERSILENE 4 0 P 3 (SUTURE) IMPLANT
SUT PROLENE 6 0 P 1 18 (SUTURE) ×3 IMPLANT
SUT SILK 4 0 PS 2 (SUTURE) IMPLANT
SUT STEEL 4 (SUTURE) ×3 IMPLANT
SUT SUPRAMID 3-0 (SUTURE) ×6 IMPLANT
SUT SUPRAMID 4-0 (SUTURE) IMPLANT
SUT VICRYL 3-0 CR8 SH (SUTURE) IMPLANT
SUT VICRYL RAPIDE 4-0 (SUTURE) IMPLANT
SUT VICRYL RAPIDE 4/0 PS 2 (SUTURE) IMPLANT
SUTURE FIBERWR 2-0 18 17.9 3/8 (SUTURE) IMPLANT
SYR BULB 3OZ (MISCELLANEOUS) ×3 IMPLANT
SYRINGE 10CC LL (SYRINGE) IMPLANT
TOWEL OR 17X24 6PK STRL BLUE (TOWEL DISPOSABLE) ×3 IMPLANT
TOWEL OR NON WOVEN STRL DISP B (DISPOSABLE) ×3 IMPLANT
TUBE CONNECTING 20'X1/4 (TUBING)
TUBE CONNECTING 20X1/4 (TUBING) IMPLANT
UNDERPAD 30X30 INCONTINENT (UNDERPADS AND DIAPERS) ×3 IMPLANT

## 2014-04-13 NOTE — Anesthesia Postprocedure Evaluation (Signed)
Anesthesia Post Note  Patient: Monique Russo  Procedure(s) Performed: Procedure(s) (LRB): RIGHT HAND CENTRAL TENDON CENTRALIZATION OF LONG AND RING FINGERS (Right)  Anesthesia type: regional  Patient location: PACU  Post pain: Pain level controlled  Post assessment: Patient's Cardiovascular Status Stable  Last Vitals:  Filed Vitals:   04/13/14 1300  BP: 101/54  Pulse: 69  Temp: 36.4 C  Resp: 16    Post vital signs: Reviewed and stable  Level of consciousness: awake  Complications: No apparent anesthesia complications

## 2014-04-13 NOTE — Op Note (Signed)
04/13/2014  10:10 AM  PATIENT:  Monique Russo  81 y.o. female  PRE-OPERATIVE DIAGNOSIS:  Right long and ring finger extensor tendon subluxation/dislocation  POST-OPERATIVE DIAGNOSIS:  Same  PROCEDURE:  Right long and ring finger extensor tendon centralization  SURGEON: Rayvon Char. Grandville Silos, MD  PHYSICIAN ASSISTANT: Morley Kos, OPA-C  ANESTHESIA:  regional  SPECIMENS:  None  DRAINS:   None  EBL:  less than 50 mL  PREOPERATIVE INDICATIONS:  [name] is a  81 y.o. 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  OPERATIVE IMPLANTS: none  OPERATIVE PROCEDURE:  After receiving prophylactic antibiotics, the patient was escorted to the operative theatre and placed in a supine position.  A surgical "time-out" was performed during which the planned procedure, proposed operative site, and the correct patient identity were compared to the operative consent and agreement confirmed by the circulating nurse according to current facility policy.  Following application of a tourniquet to the operative extremity, the exposed skin was pre-scrubbed with a Hibiclens scrub brush before being formally prepped with Chloraprep and draped in the usual sterile fashion.  The limb was exsanguinated with an Esmarch bandage and the tourniquet inflated to approximately 120mmHg higher than systolic BP.  2 curvilinear incisions were marked and made centered over the MP joints of the long and ring fingers, with the apex of the curve pointing radially. The skin was incised sharply with a scalpel. Subcutaneous taste tissues were  dissected with blunt spreading dissection. In the week since I last saw her, the splinting worked to achieve an extended position but did not reflexively spring back into a flexed posture. In addition, it appeared that the MP joints were reduced, with slight ulnar drift. After exposing the extensor apparatus, there was significant juncturae that were tight between the tendons of the long finger and the ring finger and especially between the ring and small fingers. These were released. The long finger was then addressed first, with suture imbrication of the radial collateral ligament, causing increased tension of the digit into the neutral posture from the ulnarly deviated position. With it at this position, the extensor tendon centralization was then performed. The ulnar sagittal band was split longitudinally to allow for centralization of the tendon, radial sagittal band imbricated with 3-0 Supramid suture and then a longitudinal half strip on the ulnar side of the tendon was stripped with a proximal base, woven through a split in the dorsal radial capsule and back to the tendon itself to provide additional restraint against ulnar subluxation. It was sewn back to the tendon with 3-0 Supramid suture. An identical procedure was performed for the ring finger and then the wounds were copiously irrigated as the tourniquet was released. The tendons remained central, not deviating radially and ulnarly with full passive flexion at the MP joints and also with simulated active extension of the long extensor tendon. The skin was closed with 4-0 Vicryl Rapide running horizontal mattress suture and then a short arm splint dressing was applied placing the MP joints into extension and also with some labral tethers tending to push the proximal phalanges towards radial side. She was awakened and taken to the recovery room in stable condition  DISPOSITION: She'll be discharged home today with instructions, returning in 7-10 days  with follow-up hand therapy appointment to begin her rehabilitation for central tendon centralization and collateral ligament stabilization.

## 2014-04-13 NOTE — Transfer of Care (Signed)
Immediate Anesthesia Transfer of Care Note  Patient: Monique Russo  Procedure(s) Performed: Procedure(s): RIGHT HAND CENTRAL TENDON CENTRALIZATION OF LONG AND RING FINGERS (Right)  Patient Location: PACU  Anesthesia Type:MAC combined with regional for post-op pain  Level of Consciousness: awake, alert , oriented and patient cooperative  Airway & Oxygen Therapy: Patient Spontanous Breathing and Patient connected to face mask oxygen  Post-op Assessment: Report given to PACU RN and Post -op Vital signs reviewed and stable  Post vital signs: Reviewed and stable  Complications: No apparent anesthesia complications

## 2014-04-13 NOTE — Interval H&P Note (Signed)
History and Physical Interval Note:  04/13/2014 10:10 AM  Monique Russo  has presented today for surgery, with the diagnosis of RIGHT HAND EXTENSOR TENDON SUBLUXATION  The various methods of treatment have been discussed with the patient and family. After consideration of risks, benefits and other options for treatment, the patient has consented to  Procedure(s): RIGHT HAND CENTRAL TENDON CENTRALIZATION OF LONG AND RING FINGERS (Right) as a surgical intervention .  The patient's history has been reviewed, patient examined, no change in status, stable for surgery.  I have reviewed the patient's chart and labs.  Questions were answered to the patient's satisfaction.     Verenis Nicosia A.

## 2014-04-13 NOTE — Anesthesia Preprocedure Evaluation (Addendum)
Anesthesia Evaluation  Patient identified by MRN, date of birth, ID band Patient awake    Reviewed: Allergy & Precautions, NPO status , Patient's Chart, lab work & pertinent test results  Airway Mallampati: I  TM Distance: >3 FB Neck ROM: Full    Dental   Pulmonary          Cardiovascular +CHF     Neuro/Psych Depression    GI/Hepatic GERD-  Medicated,  Endo/Other    Renal/GU      Musculoskeletal  (+) Arthritis -, On Methadone for Arthritis   Abdominal   Peds  Hematology   Anesthesia Other Findings   Reproductive/Obstetrics                           Anesthesia Physical Anesthesia Plan  ASA: III  Anesthesia Plan: Regional   Post-op Pain Management:    Induction: Intravenous  Airway Management Planned: Simple Face Mask  Additional Equipment:   Intra-op Plan:   Post-operative Plan:   Informed Consent: I have reviewed the patients History and Physical, chart, labs and discussed the procedure including the risks, benefits and alternatives for the proposed anesthesia with the patient or authorized representative who has indicated his/her understanding and acceptance.     Plan Discussed with: CRNA and Surgeon  Anesthesia Plan Comments:        Anesthesia Quick Evaluation

## 2014-04-13 NOTE — Anesthesia Procedure Notes (Addendum)
Anesthesia Regional Block:  Supraclavicular block  Pre-Anesthetic Checklist: ,, timeout performed, Correct Patient, Correct Site, Correct Laterality, Correct Procedure, Correct Position, site marked, Risks and benefits discussed,  Surgical consent,  Pre-op evaluation,  At surgeon's request and post-op pain management  Laterality: Right  Prep: chloraprep       Needles:   Needle Type: Echogenic Stimulator Needle     Needle Length: 9cm 9 cm Needle Gauge: 21 and 21 G    Additional Needles:  Procedures: ultrasound guided (picture in chart) and nerve stimulator Supraclavicular block  Nerve Stimulator or Paresthesia:  Response: 0.4 mA,   Additional Responses:   Narrative:  Start time: 04/13/2014 9:55 AM End time: 04/13/2014 10:05 AM Injection made incrementally with aspirations every 5 mL.  Performed by: Personally  Anesthesiologist: Lillia Abed  Additional Notes: Monitors applied. Patient sedated. Sterile prep and drape,hand hygiene and sterile gloves were used. Relevant anatomy identified.Needle position confirmed.Local anesthetic injected incrementally after negative aspiration. Local anesthetic spread visualized around nerve(s). Vascular puncture avoided. No complications. Image printed for medical record.The patient tolerated the procedure well.        Procedure Name: MAC Date/Time: 04/13/2014 10:19 AM Performed by: Birda Didonato Pre-anesthesia Checklist: Patient identified, Emergency Drugs available, Suction available, Patient being monitored and Timeout performed Patient Re-evaluated:Patient Re-evaluated prior to inductionOxygen Delivery Method: Simple face mask

## 2014-04-13 NOTE — Discharge Instructions (Addendum)
Discharge Instructions   You have a dressing with a plaster splint incorporated in it. Move your fingers as much as possible, making a full fist and fully opening the fist. Elevate your hand to reduce pain & swelling of the digits.  Ice over the operative site may be helpful to reduce pain & swelling.  DO NOT USE HEAT. Pain medicine has been prescribed for you.  Use your medicine as needed over the first 48 hours, and then you can begin to taper your use.  You may use Tylenol in place of your prescribed pain medication, but not IN ADDITION to it. Leave the dressing in place until you return to our office.  You may shower, but keep the bandage clean & dry.  You may drive a car when you are off of prescription pain medications and can safely control your vehicle with both hands. Our office will call you to arrange follow-up   Please call [phone] during normal business hours or [phone] after hours for any problems. Including the following:  - excessive redness of the incisions - drainage for more than 4 days - fever of more than 101.5 F  *Please note that pain medications will not be refilled after hours or on weekends.   Post Anesthesia Home Care Instructions  Activity: Get plenty of rest for the remainder of the day. A responsible adult should stay with you for 24 hours following the procedure.  For the next 24 hours, DO NOT: -Drive a car -Paediatric nurse -Drink alcoholic beverages -Take any medication unless instructed by your physician -Make any legal decisions or sign important papers.  Meals: Start with liquid foods such as gelatin or soup. Progress to regular foods as tolerated. Avoid greasy, spicy, heavy foods. If nausea and/or vomiting occur, drink only clear liquids until the nausea and/or vomiting subsides. Call your physician if vomiting continues.  Special Instructions/Symptoms: Your throat may feel dry or sore from the anesthesia or the breathing tube  placed in your throat during surgery. If this causes discomfort, gargle with warm salt water. The discomfort should disappear within 24 hours.  Regional Anesthesia Blocks  1. Numbness or the inability to move the "blocked" extremity may last from 3-48 hours after placement. The length of time depends on the medication injected and your individual response to the medication. If the numbness is not going away after 48 hours, call your surgeon.  2. The extremity that is blocked will need to be protected until the numbness is gone and the  Strength has returned. Because you cannot feel it, you will need to take extra care to avoid injury. Because it may be weak, you may have difficulty moving it or using it. You may not know what position it is in without looking at it while the block is in effect.  3. For blocks in the legs and feet, returning to weight bearing and walking needs to be done carefully. You will need to wait until the numbness is entirely gone and the strength has returned. You should be able to move your leg and foot normally before you try and bear weight or walk. You will need someone to be with you when you first try to ensure you do not fall and possibly risk injury.  4. Bruising and tenderness at the needle site are common side effects and will resolve in a few days.  5. Persistent numbness or new problems with movement should be communicated to the surgeon or the [facility] ([phone])/ [facility] ([phone]). °

## 2014-04-13 NOTE — Progress Notes (Signed)
Assisted Dr. Ossey with right, ultrasound guided, supraclavicular block. Side rails up, monitors on throughout procedure. See vital signs in flow sheet. Tolerated Procedure well. 

## 2014-04-14 ENCOUNTER — Encounter (HOSPITAL_BASED_OUTPATIENT_CLINIC_OR_DEPARTMENT_OTHER): Payer: Self-pay | Admitting: Orthopedic Surgery

## 2014-07-17 ENCOUNTER — Inpatient Hospital Stay (HOSPITAL_COMMUNITY)
Admission: EM | Admit: 2014-07-17 | Discharge: 2014-07-20 | DRG: 291 | Disposition: A | Discharge status: Home Health | Payer: PPO | Attending: Internal Medicine | Admitting: Internal Medicine

## 2014-07-17 ENCOUNTER — Encounter (HOSPITAL_COMMUNITY): Payer: Self-pay

## 2014-07-17 ENCOUNTER — Emergency Department (HOSPITAL_COMMUNITY): Payer: PPO

## 2014-07-17 DIAGNOSIS — R0602 Shortness of breath: Secondary | ICD-10-CM

## 2014-07-17 DIAGNOSIS — K449 Diaphragmatic hernia without obstruction or gangrene: Secondary | ICD-10-CM | POA: Diagnosis present

## 2014-07-17 DIAGNOSIS — M1990 Unspecified osteoarthritis, unspecified site: Secondary | ICD-10-CM | POA: Diagnosis present

## 2014-07-17 DIAGNOSIS — J181 Lobar pneumonia, unspecified organism: Secondary | ICD-10-CM | POA: Diagnosis present

## 2014-07-17 DIAGNOSIS — J189 Pneumonia, unspecified organism: Secondary | ICD-10-CM | POA: Diagnosis not present

## 2014-07-17 DIAGNOSIS — I5021 Acute systolic (congestive) heart failure: Secondary | ICD-10-CM | POA: Diagnosis not present

## 2014-07-17 DIAGNOSIS — N179 Acute kidney failure, unspecified: Secondary | ICD-10-CM

## 2014-07-17 DIAGNOSIS — I5043 Acute on chronic combined systolic (congestive) and diastolic (congestive) heart failure: Secondary | ICD-10-CM | POA: Diagnosis present

## 2014-07-17 DIAGNOSIS — R7989 Other specified abnormal findings of blood chemistry: Secondary | ICD-10-CM | POA: Diagnosis not present

## 2014-07-17 DIAGNOSIS — I509 Heart failure, unspecified: Secondary | ICD-10-CM | POA: Diagnosis not present

## 2014-07-17 DIAGNOSIS — R0600 Dyspnea, unspecified: Secondary | ICD-10-CM | POA: Diagnosis not present

## 2014-07-17 DIAGNOSIS — Z515 Encounter for palliative care: Secondary | ICD-10-CM | POA: Diagnosis not present

## 2014-07-17 DIAGNOSIS — Z66 Do not resuscitate: Secondary | ICD-10-CM | POA: Diagnosis present

## 2014-07-17 DIAGNOSIS — J9601 Acute respiratory failure with hypoxia: Secondary | ICD-10-CM

## 2014-07-17 DIAGNOSIS — E785 Hyperlipidemia, unspecified: Secondary | ICD-10-CM | POA: Diagnosis present

## 2014-07-17 DIAGNOSIS — R945 Abnormal results of liver function studies: Secondary | ICD-10-CM

## 2014-07-17 DIAGNOSIS — F329 Major depressive disorder, single episode, unspecified: Secondary | ICD-10-CM | POA: Diagnosis present

## 2014-07-17 DIAGNOSIS — D509 Iron deficiency anemia, unspecified: Secondary | ICD-10-CM | POA: Diagnosis present

## 2014-07-17 DIAGNOSIS — M549 Dorsalgia, unspecified: Secondary | ICD-10-CM | POA: Diagnosis present

## 2014-07-17 DIAGNOSIS — D72829 Elevated white blood cell count, unspecified: Secondary | ICD-10-CM

## 2014-07-17 DIAGNOSIS — R0902 Hypoxemia: Secondary | ICD-10-CM | POA: Diagnosis present

## 2014-07-17 DIAGNOSIS — E871 Hypo-osmolality and hyponatremia: Secondary | ICD-10-CM | POA: Diagnosis not present

## 2014-07-17 DIAGNOSIS — G894 Chronic pain syndrome: Secondary | ICD-10-CM | POA: Diagnosis present

## 2014-07-17 DIAGNOSIS — K219 Gastro-esophageal reflux disease without esophagitis: Secondary | ICD-10-CM | POA: Diagnosis present

## 2014-07-17 DIAGNOSIS — D649 Anemia, unspecified: Secondary | ICD-10-CM | POA: Diagnosis present

## 2014-07-17 DIAGNOSIS — Z96611 Presence of right artificial shoulder joint: Secondary | ICD-10-CM | POA: Diagnosis present

## 2014-07-17 LAB — CBC WITH DIFFERENTIAL/PLATELET
BASOS ABS: 0 10*3/uL (ref 0.0–0.1)
Basophils Absolute: 0 10*3/uL (ref 0.0–0.1)
Basophils Relative: 0 % (ref 0–1)
Basophils Relative: 0 % (ref 0–1)
EOS PCT: 1 % (ref 0–5)
Eosinophils Absolute: 0.2 10*3/uL (ref 0.0–0.7)
Eosinophils Absolute: 0.1 10*3/uL (ref 0.0–0.7)
Eosinophils Relative: 1 % (ref 0–5)
HCT: 32.1 % — ABNORMAL LOW (ref 36.0–46.0)
HCT: 31.9 % — ABNORMAL LOW (ref 36.0–46.0)
HEMOGLOBIN: 10.8 g/dL — AB (ref 12.0–15.0)
Hemoglobin: 10.6 g/dL — ABNORMAL LOW (ref 12.0–15.0)
LYMPHS ABS: 1 10*3/uL (ref 0.7–4.0)
LYMPHS PCT: 5 % — AB (ref 12–46)
Lymphocytes Relative: 5 % — ABNORMAL LOW (ref 12–46)
Lymphs Abs: 1.1 10*3/uL (ref 0.7–4.0)
MCH: 33.1 pg (ref 26.0–34.0)
MCH: 33.9 pg (ref 26.0–34.0)
MCHC: 33 g/dL (ref 30.0–36.0)
MCHC: 33.9 g/dL (ref 30.0–36.0)
MCV: 100.3 fL — AB (ref 78.0–100.0)
MCV: 100 fL (ref 78.0–100.0)
MONOS PCT: 4 % (ref 3–12)
Monocytes Absolute: 0.9 10*3/uL (ref 0.1–1.0)
Monocytes Absolute: 1.1 10*3/uL — ABNORMAL HIGH (ref 0.1–1.0)
Monocytes Relative: 5 % (ref 3–12)
NEUTROS PCT: 89 % — AB (ref 43–77)
Neutro Abs: 18.8 10*3/uL — ABNORMAL HIGH (ref 1.7–7.7)
Neutro Abs: 18.7 10*3/uL — ABNORMAL HIGH (ref 1.7–7.7)
Neutrophils Relative %: 90 % — ABNORMAL HIGH (ref 43–77)
PLATELETS: 335 10*3/uL (ref 150–400)
Platelets: 319 10*3/uL (ref 150–400)
RBC: 3.2 MIL/uL — AB (ref 3.87–5.11)
RBC: 3.19 MIL/uL — ABNORMAL LOW (ref 3.87–5.11)
RDW: 13.3 % (ref 11.5–15.5)
RDW: 13.4 % (ref 11.5–15.5)
WBC: 20.8 10*3/uL — ABNORMAL HIGH (ref 4.0–10.5)
WBC: 20.9 10*3/uL — AB (ref 4.0–10.5)

## 2014-07-17 LAB — URINALYSIS, ROUTINE W REFLEX MICROSCOPIC
Bilirubin Urine: NEGATIVE
Bilirubin Urine: NEGATIVE
GLUCOSE, UA: NEGATIVE mg/dL
Glucose, UA: NEGATIVE mg/dL
HGB URINE DIPSTICK: NEGATIVE
Hgb urine dipstick: NEGATIVE
KETONES UR: NEGATIVE mg/dL
KETONES UR: NEGATIVE mg/dL
Leukocytes, UA: NEGATIVE
NITRITE: NEGATIVE
Nitrite: NEGATIVE
PH: 5.5 (ref 5.0–8.0)
PROTEIN: NEGATIVE mg/dL
Protein, ur: NEGATIVE mg/dL
Specific Gravity, Urine: 1.005 (ref 1.005–1.030)
Specific Gravity, Urine: 1.006 (ref 1.005–1.030)
UROBILINOGEN UA: 0.2 mg/dL (ref 0.0–1.0)
Urobilinogen, UA: 0.2 mg/dL (ref 0.0–1.0)
pH: 6.5 (ref 5.0–8.0)

## 2014-07-17 LAB — COMPREHENSIVE METABOLIC PANEL
ALT: 95 U/L — ABNORMAL HIGH (ref 0–35)
AST: 103 U/L — AB (ref 0–37)
Albumin: 3.2 g/dL — ABNORMAL LOW (ref 3.5–5.2)
Alkaline Phosphatase: 134 U/L — ABNORMAL HIGH (ref 39–117)
Anion gap: 9 (ref 5–15)
BILIRUBIN TOTAL: 0.6 mg/dL (ref 0.3–1.2)
BUN: 30 mg/dL — ABNORMAL HIGH (ref 6–23)
CALCIUM: 9.8 mg/dL (ref 8.4–10.5)
CO2: 31 mmol/L (ref 19–32)
Chloride: 88 mmol/L — ABNORMAL LOW (ref 96–112)
Creatinine, Ser: 1.24 mg/dL — ABNORMAL HIGH (ref 0.50–1.10)
GFR calc Af Amer: 46 mL/min — ABNORMAL LOW (ref >90)
GFR calc non Af Amer: 40 mL/min — ABNORMAL LOW (ref >90)
GLUCOSE: 125 mg/dL — AB (ref 70–99)
Potassium: 4.8 mmol/L (ref 3.5–5.1)
Sodium: 128 mmol/L — ABNORMAL LOW (ref 135–145)
Total Protein: 7 g/dL (ref 6.0–8.3)

## 2014-07-17 LAB — I-STAT CHEM 8, ED
BUN: 39 mg/dL — ABNORMAL HIGH (ref 6–23)
CALCIUM ION: 1.18 mmol/L (ref 1.13–1.30)
Chloride: 88 mmol/L — ABNORMAL LOW (ref 96–112)
Creatinine, Ser: 1.3 mg/dL — ABNORMAL HIGH (ref 0.50–1.10)
Glucose, Bld: 120 mg/dL — ABNORMAL HIGH (ref 70–99)
HCT: 38 % (ref 36.0–46.0)
Hemoglobin: 12.9 g/dL (ref 12.0–15.0)
POTASSIUM: 4.9 mmol/L (ref 3.5–5.1)
SODIUM: 126 mmol/L — AB (ref 135–145)
TCO2: 28 mmol/L (ref 0–100)

## 2014-07-17 LAB — URINE MICROSCOPIC-ADD ON

## 2014-07-17 LAB — PROTIME-INR
INR: 1.13 (ref 0.00–1.49)
PROTHROMBIN TIME: 14.6 s (ref 11.6–15.2)

## 2014-07-17 LAB — PROCALCITONIN: Procalcitonin: 5.78 ng/mL

## 2014-07-17 LAB — CREATININE, SERUM
Creatinine, Ser: 1.18 mg/dL — ABNORMAL HIGH (ref 0.50–1.10)
GFR calc Af Amer: 49 mL/min — ABNORMAL LOW (ref >90)
GFR, EST NON AFRICAN AMERICAN: 42 mL/min — AB (ref >90)

## 2014-07-17 LAB — I-STAT TROPONIN, ED: Troponin i, poc: 0 ng/mL (ref 0.00–0.08)

## 2014-07-17 LAB — BRAIN NATRIURETIC PEPTIDE: B NATRIURETIC PEPTIDE 5: 159.5 pg/mL — AB (ref 0.0–100.0)

## 2014-07-17 LAB — I-STAT CG4 LACTIC ACID, ED: Lactic Acid, Venous: 1.44 mmol/L (ref 0.5–2.0)

## 2014-07-17 LAB — TROPONIN I

## 2014-07-17 MED ORDER — SODIUM CHLORIDE 0.9 % IJ SOLN
3.0000 mL | Freq: Two times a day (BID) | INTRAMUSCULAR | Status: DC
  Administered 2014-07-18 – 2014-07-19 (×3): 3 mL via INTRAVENOUS

## 2014-07-17 MED ORDER — SODIUM CHLORIDE 0.9 % IV SOLN: 250.0000 mL | INTRAVENOUS | Status: DC | PRN

## 2014-07-17 MED ORDER — FUROSEMIDE 10 MG/ML IJ SOLN
40.0000 mg | Freq: Once | INTRAMUSCULAR | Status: AC
  Administered 2014-07-17: 40 mg via INTRAVENOUS
  Filled 2014-07-17: qty 4

## 2014-07-17 MED ORDER — TEMAZEPAM 15 MG PO CAPS
30.0000 mg | ORAL_CAPSULE | Freq: Every evening | ORAL | Status: DC | PRN
  Administered 2014-07-17 – 2014-07-19 (×3): 30 mg via ORAL
  Filled 2014-07-17 (×3): qty 2

## 2014-07-17 MED ORDER — METHADONE HCL 5 MG PO TABS
5.0000 mg | ORAL_TABLET | Freq: Three times a day (TID) | ORAL | Status: DC
  Administered 2014-07-17 – 2014-07-20 (×8): 5 mg via ORAL
  Filled 2014-07-17 (×8): qty 1

## 2014-07-17 MED ORDER — OXYCODONE HCL 5 MG PO TABS
5.0000 mg | ORAL_TABLET | Freq: Three times a day (TID) | ORAL | Status: DC | PRN
  Administered 2014-07-18 – 2014-07-20 (×5): 5 mg via ORAL
  Filled 2014-07-17 (×5): qty 1

## 2014-07-17 MED ORDER — TIMOLOL HEMIHYDRATE 0.5 % OP SOLN: 1.0000 [drp] | Freq: Every day | OPHTHALMIC | Status: DC

## 2014-07-17 MED ORDER — ONDANSETRON HCL 4 MG/2ML IJ SOLN: 4.0000 mg | Freq: Four times a day (QID) | INTRAMUSCULAR | Status: DC | PRN

## 2014-07-17 MED ORDER — OXYCODONE-ACETAMINOPHEN 10-325 MG PO TABS: 1.0000 | ORAL_TABLET | Freq: Three times a day (TID) | ORAL | Status: DC | PRN

## 2014-07-17 MED ORDER — BISACODYL 10 MG RE SUPP
10.0000 mg | Freq: Every day | RECTAL | Status: DC | PRN
  Administered 2014-07-18: 10 mg via RECTAL
  Filled 2014-07-17: qty 1

## 2014-07-17 MED ORDER — FLUTICASONE PROPIONATE 50 MCG/ACT NA SUSP
1.0000 | Freq: Every day | NASAL | Status: DC
  Administered 2014-07-18 – 2014-07-20 (×3): 1 via NASAL
  Filled 2014-07-17: qty 16

## 2014-07-17 MED ORDER — LEVOFLOXACIN IN D5W 750 MG/150ML IV SOLN
750.0000 mg | INTRAVENOUS | Status: DC
  Administered 2014-07-19: 750 mg via INTRAVENOUS
  Filled 2014-07-17: qty 150

## 2014-07-17 MED ORDER — SODIUM CHLORIDE 0.9 % IJ SOLN: 3.0000 mL | INTRAMUSCULAR | Status: DC | PRN

## 2014-07-17 MED ORDER — ACETAMINOPHEN 325 MG PO TABS: 650.0000 mg | ORAL_TABLET | ORAL | Status: DC | PRN

## 2014-07-17 MED ORDER — TIMOLOL MALEATE 0.5 % OP SOLN
1.0000 [drp] | Freq: Every day | OPHTHALMIC | Status: DC
  Administered 2014-07-18 – 2014-07-20 (×3): 1 [drp] via OPHTHALMIC
  Filled 2014-07-17: qty 5

## 2014-07-17 MED ORDER — LEVOFLOXACIN IN D5W 750 MG/150ML IV SOLN
750.0000 mg | Freq: Once | INTRAVENOUS | Status: AC
  Administered 2014-07-17: 750 mg via INTRAVENOUS
  Filled 2014-07-17: qty 150

## 2014-07-17 MED ORDER — ENOXAPARIN SODIUM 30 MG/0.3ML ~~LOC~~ SOLN
30.0000 mg | SUBCUTANEOUS | Status: DC
  Administered 2014-07-18 – 2014-07-20 (×3): 30 mg via SUBCUTANEOUS
  Filled 2014-07-17 (×3): qty 0.3

## 2014-07-17 MED ORDER — GABAPENTIN 400 MG PO CAPS
400.0000 mg | ORAL_CAPSULE | Freq: Two times a day (BID) | ORAL | Status: DC
  Administered 2014-07-17 – 2014-07-19 (×4): 400 mg via ORAL
  Filled 2014-07-17 (×4): qty 1

## 2014-07-17 MED ORDER — OXYCODONE-ACETAMINOPHEN 5-325 MG PO TABS
1.0000 | ORAL_TABLET | Freq: Three times a day (TID) | ORAL | Status: DC | PRN
  Administered 2014-07-18 – 2014-07-20 (×5): 1 via ORAL
  Filled 2014-07-17 (×5): qty 1

## 2014-07-17 MED ORDER — DULOXETINE HCL 60 MG PO CPEP
60.0000 mg | ORAL_CAPSULE | Freq: Every day | ORAL | Status: DC
  Administered 2014-07-17 – 2014-07-19 (×3): 60 mg via ORAL
  Filled 2014-07-17 (×3): qty 1

## 2014-07-17 NOTE — ED Notes (Signed)
Bib ems related to one day onset of sob. Spo2 79% on scene given albuterol 10 and atrovent. Pt alert and talkative and this time.

## 2014-07-17 NOTE — Progress Notes (Signed)
ANTIBIOTIC CONSULT NOTE - INITIAL  Pharmacy Consult for levaquin Indication: CAP  No Known Allergies  Patient Measurements: Height: 5' (152.4 cm) Weight: 190 lb 6.4 oz (86.365 kg) IBW/kg (Calculated) : 45.5  Vital Signs: Temp: 99 F (37.2 C) (04/29 2101) Temp Source: Oral (04/29 2101) BP: 118/55 mmHg (04/29 2101) Pulse Rate: 88 (04/29 2101) Intake/Output from previous day:   Intake/Output from this shift: Total I/O In: -  Out: 1800 [Urine:1800]  Labs:  Recent Labs  07/17/14 1700 07/17/14 1830  WBC 20.8*  --   HGB 10.6* 12.9  PLT 335  --   CREATININE 1.24* 1.30*   Estimated Creatinine Clearance: 33.2 mL/min (by C-G formula based on Cr of 1.3). No results for input(s): VANCOTROUGH, VANCOPEAK, VANCORANDOM, GENTTROUGH, GENTPEAK, GENTRANDOM, TOBRATROUGH, TOBRAPEAK, TOBRARND, AMIKACINPEAK, AMIKACINTROU, AMIKACIN in the last 72 hours.   Microbiology: No results found for this or any previous visit (from the past 720 hour(s)).  Medical History: Past Medical History  Diagnosis Date  . Arthritis   . CHF (congestive heart failure)   . Pneumonia   . Depression   . GERD (gastroesophageal reflux disease)     Medications:  See med rec  Assessment: Patient's an 81 y.o F with hx HF, PNA, depression and GERD who presents to the ED today with c/o SOB.  To start levaquin for r/o PNA.  She received levaquin 750mg  IV in the ED at 1900. - scr 1.30 (crcl~33)  4/29 LVQ>>  4/29 bcx x2:  4/29 CXR: Diffuse bilateral interstitial and alveolar opacities with borderline heart size. Suspect edema/ CHF.  Plan:  - Levaquin 750mg  IV q48h - f/u renal function and adjust dose if/when appropriate.  Dia Sitter P 07/17/2014,9:05 PM

## 2014-07-17 NOTE — ED Provider Notes (Signed)
CSN: [id number]     Arrival date & time 07/17/14  1508 History   First MD Initiated Contact with Patient 07/17/14 1522     Chief Complaint  Patient presents with  . Shortness of Breath     (Consider location/radiation/quality/duration/timing/severity/associated sxs/prior Treatment) The history is provided by the patient.  Monique Russo is a 81 y.o. female history of CHF, pneumonia, reflux here presenting with shortness of breath. Patient has acute onset of shortness of breath starting today. Woke up this morning and noticed worsening shortness of breath throughout the day. Some productive cough. Denies any fevers or chills. States that she felt well yesterday. EMS noticed that her oxygen was 79% on room air on arrival. She was noticed to have some crackles and Rales and given albuterol and Atrovent. Patient states that she had something similar when she developed pneumonia before. Has chronic leg swelling, not getting worse.    Past Medical History  Diagnosis Date  . Arthritis   . CHF (congestive heart failure)   . Pneumonia   . Depression   . GERD (gastroesophageal reflux disease)    Past Surgical History  Procedure Laterality Date  . Abdominal hysterectomy    . Joint replacement Left     shoulder  . Reverse shoulder arthroplasty Right 12/04/2013  . Reverse shoulder arthroplasty  12/04/2013    Procedure: REVERSE SHOULDER ARTHROPLASTY;  Surgeon: Nita Sells, MD;  Location: [facility] OR;  Service: Orthopedics;;  Right reverse total shoulder arthroplasty  . Back surgery  2000  . Colon surgery      x2-blockage  . Hernia repair  2012  . Orif wrist fracture  2012    left  . Ercp    . Colonoscopy    . Rotator cuff repair      dr Tamera Punt  . Shoulder arthroscopy  2012    lt and rt  . Tendon repair Right 04/13/2014    Procedure: RIGHT HAND CENTRAL TENDON CENTRALIZATION OF LONG AND RING FINGERS;  Surgeon: Jolyn Nap, MD;  Location: [place];  Service:  Orthopedics;  Laterality: Right;   History reviewed. No pertinent family history. History  Substance Use Topics  . Smoking status: Never Smoker   . Smokeless tobacco: Never Used  . Alcohol Use: 4.2 oz/week    7 Glasses of wine per week   OB History    No data available     Review of Systems  Respiratory: Positive for shortness of breath.   All other systems reviewed and are negative.     Allergies  Review of patient's allergies indicates no known allergies.  Home Medications   Prior to Admission medications   Medication Sig Start Date End Date Taking? Authorizing Provider  acetaminophen (TYLENOL) 325 MG tablet Take 325 mg by mouth every 6 (six) hours as needed for mild pain or headache. 07/10/13  Yes Bobby Rumpf York, PA-C  Ascorbic Acid (VITAMIN C) 1000 MG tablet Take 1,000 mg by mouth daily.   Yes Historical Provider, MD  b complex vitamins tablet Take 1 tablet by mouth daily as needed.    Yes Historical Provider, MD  bisacodyl (DULCOLAX) 10 MG suppository Place 1 suppository (10 mg total) rectally daily as needed for moderate constipation (May repeat times one). 07/10/13  Yes Marianne L York, PA-C  budesonide (RHINOCORT AQUA) 32 MCG/ACT nasal spray Place 1 spray into both nostrils daily as needed for rhinitis.   Yes Historical Provider, MD  cholecalciferol (VITAMIN D) 1000 UNITS  tablet Take 1,000 Units by mouth daily.   Yes Historical Provider, MD  diclofenac (VOLTAREN) 50 MG EC tablet Take 50 mg by mouth 2 (two) times daily.    Yes Historical Provider, MD  diclofenac sodium (VOLTAREN) 1 % GEL Apply topically every three (3) days as needed (For Leg pain). 1 gm   Yes Historical Provider, MD  docusate sodium (COLACE) 100 MG capsule Take 1 capsule (100 mg total) by mouth 3 (three) times daily as needed. 12/08/13  Yes Grier Mitts, PA-C  DULoxetine (CYMBALTA) 60 MG capsule Take 60 mg by mouth daily. At bedtime   Yes Historical Provider, MD  furosemide (LASIX) 80 MG tablet Take  80 mg by mouth daily. Prescribed as needed   Yes Historical Provider, MD  gabapentin (NEURONTIN) 400 MG capsule Take 400 mg by mouth 2 (two) times daily.    Yes Historical Provider, MD  magnesium hydroxide (MILK OF MAGNESIA) 400 MG/5ML suspension Take 30 mLs by mouth daily as needed for mild constipation.   Yes Historical Provider, MD  methadone (DOLOPHINE) 5 MG tablet Take 5 mg by mouth every 8 (eight) hours.    Yes Historical Provider, MD  methocarbamol (ROBAXIN) 750 MG tablet Take 750 mg by mouth every 8 (eight) hours as needed (Prescribed TID but patient takes PRN).    Yes Historical Provider, MD  Omega-3 Fatty Acids (FISH OIL) 1200 MG CAPS Take 2 capsules by mouth daily.   Yes Historical Provider, MD  oxyCODONE-acetaminophen (PERCOCET) 10-325 MG per tablet Take 1 tablet by mouth every 8 (eight) hours as needed for pain.   Yes Historical Provider, MD  potassium chloride SA (K-DUR,KLOR-CON) 20 MEQ tablet Take 20 mEq by mouth daily as needed (Only takes with lasix).   Yes Historical Provider, MD  temazepam (RESTORIL) 30 MG capsule Take 1 capsule (30 mg total) by mouth at bedtime as needed for sleep. 07/10/13  Yes Marianne L York, PA-C  timolol (BETIMOL) 0.5 % ophthalmic solution Place 1 drop into both eyes daily.    Yes Historical Provider, MD  vitamin E 400 UNIT capsule Take 400 Units by mouth daily.    Yes Historical Provider, MD  oxyCODONE-acetaminophen (ROXICET) 5-325 MG per tablet Take 1-2 tablets by mouth every 4 (four) hours as needed for severe pain. Patient not taking: Reported on 07/17/2014 12/08/13   Grier Mitts, PA-C   BP 114/65 mmHg  Pulse 88  Temp(Src) 98.7 F (37.1 C) (Oral)  Resp 24  SpO2 84% Physical Exam  Constitutional: She is oriented to person, place, and time.  Chronically ill, tachypneic   HENT:  Head: Normocephalic.  Mouth/Throat: Oropharynx is clear and moist.  Eyes: Conjunctivae are normal. Pupils are equal, round, and reactive to light.  Neck: Normal range  of motion. Neck supple.  Cardiovascular: Regular rhythm and normal heart sounds.   Pulmonary/Chest:  Tachypneic, crackles bilateral bases, diminished throughout.   Abdominal: Soft. Bowel sounds are normal.  Musculoskeletal:  2+ edema bilateral legs   Neurological: She is alert and oriented to person, place, and time.  Skin: Skin is warm and dry.  Psychiatric: She has a normal mood and affect. Her behavior is normal. Judgment and thought content normal.  Nursing note and vitals reviewed.   ED Course  Procedures (including critical care time) Labs Review Labs Reviewed  CBC WITH DIFFERENTIAL/PLATELET - Abnormal; Notable for the following:    WBC 20.8 (*)    RBC 3.20 (*)    Hemoglobin 10.6 (*)    HCT 32.1 (*)  MCV 100.3 (*)    Neutrophils Relative % 90 (*)    Neutro Abs 18.8 (*)    Lymphocytes Relative 5 (*)    All other components within normal limits  COMPREHENSIVE METABOLIC PANEL - Abnormal; Notable for the following:    Sodium 128 (*)    Chloride 88 (*)    Glucose, Bld 125 (*)    BUN 30 (*)    Creatinine, Ser 1.24 (*)    Albumin 3.2 (*)    AST 103 (*)    ALT 95 (*)    Alkaline Phosphatase 134 (*)    GFR calc non Af Amer 40 (*)    GFR calc Af Amer 46 (*)    All other components within normal limits  BRAIN NATRIURETIC PEPTIDE - Abnormal; Notable for the following:    B Natriuretic Peptide 159.5 (*)    All other components within normal limits  CULTURE, BLOOD (ROUTINE X 2)  CULTURE, BLOOD (ROUTINE X 2)  PROCALCITONIN  I-STAT TROPOININ, ED  I-STAT CG4 LACTIC ACID, ED  I-STAT CHEM 8, ED    Imaging Review Dg Chest Port 1 View  07/17/2014   CLINICAL DATA:  Shortness of Breath beginning today. History of CHF, pneumonia.  EXAM: PORTABLE CHEST - 1 VIEW  COMPARISON:  11/26/2013  FINDINGS: Large hiatal hernia. Heart is borderline in size. Diffuse interstitial prominence with patchy bilateral alveolar opacities. Suspect edema/CHF. No effusions. No acute bony abnormality.  Bilateral shoulder replacements noted.  IMPRESSION: Diffuse bilateral interstitial and alveolar opacities with borderline heart size. Suspect edema/ CHF.  Large hiatal hernia.   Electronically Signed   By: Rolm Baptise M.D.   On: 07/17/2014 15:53     EKG Interpretation   Date/Time:  Friday July 17 2014 15:33:05 EDT Ventricular Rate:  90 PR Interval:  162 QRS Duration: 92 QT Interval:  355 QTC Calculation: 434 R Axis:   -9 Text Interpretation:  Sinus rhythm No significant change since last  tracing Confirmed by Havah Ammon  MD, Danelia Snodgrass ([id number]) on 07/17/2014 3:46:07 PM      MDM   Final diagnoses:  Shortness of breath   Monique Russo is a 81 y.o. female here with shortness of breath, hypoxia. Likely CHF vs COPD vs pneumonia. Will get labs, CXR, BNP. With venti mask, O2 is around 98%. Will monitor closely.   6:27 PM WBC 21. CXR showed possible CHF but has some opacities. Given hx of septic shock, given levaquin empirically. Also given lasix for diuresis. Will admit.    Wandra Arthurs, MD 07/17/14 [phone]

## 2014-07-17 NOTE — H&P (Addendum)
PCP:  Tommy Medal, MD    Referring provider Darl Householder   Chief Complaint: Shortness of breath   HPI: Monique Russo is a 81 y.o. female   has a past medical history of Arthritis; CHF (congestive heart failure); Pneumonia; Depression; and GERD (gastroesophageal reflux disease).   Presented with Shortness of breath for the past 1 day. Per EMS on arrival her oxygen was down to 79% room air. Patient was not in respiratory distress. She was brought in to emergency department. She denies any fevers or chills or cough. She has chronic peripheral edema which is improved.  Patient reports per past few she wasn't able to wear her shoes due to worsening peripheral edema. She's been taking her Lasix 2 weeks now 80 mg a day and her lower extremity edema has improved. In emergency department patient required 4 L of oxygen at this point her oxygen saturation has improved to mid 90s. Chest x-ray showed diffuse bilateral interstitial  opacities suspicious of CHF.  Hospitalist was called for admission for hypoxia due to community-acquired pneumonia versus CHF  Review of Systems:    Pertinent positives include:  shortness of breath at rest. dyspnea on exertion, fatigue, Bilateral lower extremity swelling   Constitutional:  No weight loss, night sweats, Fevers, chills, weight loss  HEENT:  No headaches, Difficulty swallowing,Tooth/dental problems,Sore throat,  No sneezing, itching, ear ache, nasal congestion, post nasal drip,  Cardio-vascular:  No chest pain, Orthopnea, PND, anasarca, dizziness, palpitations.no  GI:  No heartburn, indigestion, abdominal pain, nausea, vomiting, diarrhea, change in bowel habits, loss of appetite, melena, blood in stool, hematemesis Resp:    No excess mucus, no productive cough, No non-productive cough, No coughing up of blood.No change in color of mucus. No wheezing. Skin:  no rash or lesions. No jaundice GU:  no dysuria, change in color of urine, no urgency or  frequency. No straining to urinate.  No flank pain.  Musculoskeletal:  No joint pain or no joint swelling. No decreased range of motion. No back pain.  Psych:  No change in mood or affect. No depression or anxiety. No memory loss.  Neuro: no localizing neurological complaints, no tingling, no weakness, no double vision, no gait abnormality, no slurred speech, no confusion  Otherwise ROS are negative except for above, 10 systems were reviewed  Past Medical History: Past Medical History  Diagnosis Date  . Arthritis   . CHF (congestive heart failure)   . Pneumonia   . Depression   . GERD (gastroesophageal reflux disease)    Past Surgical History  Procedure Laterality Date  . Abdominal hysterectomy    . Joint replacement Left     shoulder  . Reverse shoulder arthroplasty Right 12/04/2013  . Reverse shoulder arthroplasty  12/04/2013    Procedure: REVERSE SHOULDER ARTHROPLASTY;  Surgeon: Nita Sells, MD;  Location: [facility] OR;  Service: Orthopedics;;  Right reverse total shoulder arthroplasty  . Back surgery  2000  . Colon surgery      x2-blockage  . Hernia repair  2012  . Orif wrist fracture  2012    left  . Ercp    . Colonoscopy    . Rotator cuff repair      dr Tamera Punt  . Shoulder arthroscopy  2012    lt and rt  . Tendon repair Right 04/13/2014    Procedure: RIGHT HAND CENTRAL TENDON CENTRALIZATION OF LONG AND RING FINGERS;  Surgeon: Jolyn Nap, MD;  Location: [facility];  Service: Orthopedics;  Laterality: Right;     Medications: Prior to Admission medications   Medication Sig Start Date End Date Taking? Authorizing Provider  acetaminophen (TYLENOL) 325 MG tablet Take 325 mg by mouth every 6 (six) hours as needed for mild pain or headache. 07/10/13  Yes Bobby Rumpf York, PA-C  Ascorbic Acid (VITAMIN C) 1000 MG tablet Take 1,000 mg by mouth daily.   Yes Historical Provider, MD  b complex vitamins tablet Take 1 tablet by mouth daily as needed.     Yes Historical Provider, MD  bisacodyl (DULCOLAX) 10 MG suppository Place 1 suppository (10 mg total) rectally daily as needed for moderate constipation (May repeat times one). 07/10/13  Yes Marianne L York, PA-C  budesonide (RHINOCORT AQUA) 32 MCG/ACT nasal spray Place 1 spray into both nostrils daily as needed for rhinitis.   Yes Historical Provider, MD  cholecalciferol (VITAMIN D) 1000 UNITS tablet Take 1,000 Units by mouth daily.   Yes Historical Provider, MD  diclofenac (VOLTAREN) 50 MG EC tablet Take 50 mg by mouth 2 (two) times daily.    Yes Historical Provider, MD  diclofenac sodium (VOLTAREN) 1 % GEL Apply topically every three (3) days as needed (For Leg pain). 1 gm   Yes Historical Provider, MD  docusate sodium (COLACE) 100 MG capsule Take 1 capsule (100 mg total) by mouth 3 (three) times daily as needed. 12/08/13  Yes Grier Mitts, PA-C  DULoxetine (CYMBALTA) 60 MG capsule Take 60 mg by mouth daily. At bedtime   Yes Historical Provider, MD  furosemide (LASIX) 80 MG tablet Take 80 mg by mouth daily. Prescribed as needed   Yes Historical Provider, MD  gabapentin (NEURONTIN) 400 MG capsule Take 400 mg by mouth 2 (two) times daily.    Yes Historical Provider, MD  magnesium hydroxide (MILK OF MAGNESIA) 400 MG/5ML suspension Take 30 mLs by mouth daily as needed for mild constipation.   Yes Historical Provider, MD  methadone (DOLOPHINE) 5 MG tablet Take 5 mg by mouth every 8 (eight) hours.    Yes Historical Provider, MD  methocarbamol (ROBAXIN) 750 MG tablet Take 750 mg by mouth every 8 (eight) hours as needed (Prescribed TID but patient takes PRN).    Yes Historical Provider, MD  Omega-3 Fatty Acids (FISH OIL) 1200 MG CAPS Take 2 capsules by mouth daily.   Yes Historical Provider, MD  oxyCODONE-acetaminophen (PERCOCET) 10-325 MG per tablet Take 1 tablet by mouth every 8 (eight) hours as needed for pain.   Yes Historical Provider, MD  potassium chloride SA (K-DUR,KLOR-CON) 20 MEQ tablet Take  20 mEq by mouth daily as needed (Only takes with lasix).   Yes Historical Provider, MD  temazepam (RESTORIL) 30 MG capsule Take 1 capsule (30 mg total) by mouth at bedtime as needed for sleep. 07/10/13  Yes Marianne L York, PA-C  timolol (BETIMOL) 0.5 % ophthalmic solution Place 1 drop into both eyes daily.    Yes Historical Provider, MD  vitamin E 400 UNIT capsule Take 400 Units by mouth daily.    Yes Historical Provider, MD  oxyCODONE-acetaminophen (ROXICET) 5-325 MG per tablet Take 1-2 tablets by mouth every 4 (four) hours as needed for severe pain. Patient not taking: Reported on 07/17/2014 12/08/13   Grier Mitts, PA-C    Allergies:  No Known Allergies  Social History:  Ambulatory reports decreased mobility Lives at home   With family     reports that she has never smoked. She has never used smokeless tobacco. She reports that she  drinks about 4.2 oz of alcohol per week. She reports that she does not use illicit drugs.    Family History: family history includes Breast cancer in her sister; Chronic fatigue in her daughter and daughter; Stroke in her mother; Ulcerative colitis in her daughter.    Physical Exam: Patient Vitals for the past 24 hrs:  BP Temp Temp src Pulse Resp SpO2  07/17/14 1555 114/65 mmHg 98.7 F (37.1 C) Oral 88 24 (!) 84 %  07/17/14 1552 114/64 mmHg - - 88 17 97 %  07/17/14 1509 - - - - - (!) 79 %    1. General:  in No Acute distress 2. Psychological: Alert and   Oriented 3. Head/ENT:     Dry Mucous Membranes                          Head Non traumatic, neck supple                          Normal  Dentition 4. SKIN: normal   Skin turgor,  Skin clean Dry and intact no rash 5. Heart: Regular rate and rhythm no Murmur, Rub or gallop 6. Lungs:   no wheezes some crackles   7. Abdomen: Soft, non-tender, Non distended 8. Lower extremities: no clubbing, cyanosis, or edema, obese lower extremity 9. Neurologically Grossly intact, moving all 4 extremities  equally 10. MSK: Normal range of motion  body mass index is unknown because there is no weight on file.   Labs on Admission:   Results for orders placed or performed during the hospital encounter of 07/17/14 (from the past 24 hour(s))  CBC with Differential     Status: Abnormal   Collection Time: 07/17/14  5:00 PM  Result Value Ref Range   WBC 20.8 (H) 4.0 - 10.5 K/uL   RBC 3.20 (L) 3.87 - 5.11 MIL/uL   Hemoglobin 10.6 (L) 12.0 - 15.0 g/dL   HCT 32.1 (L) 36.0 - 46.0 %   MCV 100.3 (H) 78.0 - 100.0 fL   MCH 33.1 26.0 - 34.0 pg   MCHC 33.0 30.0 - 36.0 g/dL   RDW 13.3 11.5 - 15.5 %   Platelets 335 150 - 400 K/uL   Neutrophils Relative % 90 (H) 43 - 77 %   Neutro Abs 18.8 (H) 1.7 - 7.7 K/uL   Lymphocytes Relative 5 (L) 12 - 46 %   Lymphs Abs 1.0 0.7 - 4.0 K/uL   Monocytes Relative 4 3 - 12 %   Monocytes Absolute 0.9 0.1 - 1.0 K/uL   Eosinophils Relative 1 0 - 5 %   Eosinophils Absolute 0.2 0.0 - 0.7 K/uL   Basophils Relative 0 0 - 1 %   Basophils Absolute 0.0 0.0 - 0.1 K/uL  Comprehensive metabolic panel     Status: Abnormal   Collection Time: 07/17/14  5:00 PM  Result Value Ref Range   Sodium 128 (L) 135 - 145 mmol/L   Potassium 4.8 3.5 - 5.1 mmol/L   Chloride 88 (L) 96 - 112 mmol/L   CO2 31 19 - 32 mmol/L   Glucose, Bld 125 (H) 70 - 99 mg/dL   BUN 30 (H) 6 - 23 mg/dL   Creatinine, Ser 1.24 (H) 0.50 - 1.10 mg/dL   Calcium 9.8 8.4 - 10.5 mg/dL   Total Protein 7.0 6.0 - 8.3 g/dL   Albumin 3.2 (L) 3.5 - 5.2 g/dL  AST 103 (H) 0 - 37 U/L   ALT 95 (H) 0 - 35 U/L   Alkaline Phosphatase 134 (H) 39 - 117 U/L   Total Bilirubin 0.6 0.3 - 1.2 mg/dL   GFR calc non Af Amer 40 (L) >90 mL/min   GFR calc Af Amer 46 (L) >90 mL/min   Anion gap 9 5 - 15  Brain natriuretic peptide     Status: Abnormal   Collection Time: 07/17/14  5:00 PM  Result Value Ref Range   B Natriuretic Peptide 159.5 (H) 0.0 - 100.0 pg/mL  I-stat troponin, ED     Status: None   Collection Time: 07/17/14  5:08  PM  Result Value Ref Range   Troponin i, poc 0.00 0.00 - 0.08 ng/mL   Comment 3          I-Stat CG4 Lactic Acid, ED     Status: None   Collection Time: 07/17/14  5:11 PM  Result Value Ref Range   Lactic Acid, Venous 1.44 0.5 - 2.0 mmol/L  I-stat chem 8, ed     Status: Abnormal   Collection Time: 07/17/14  6:30 PM  Result Value Ref Range   Sodium 126 (L) 135 - 145 mmol/L   Potassium 4.9 3.5 - 5.1 mmol/L   Chloride 88 (L) 96 - 112 mmol/L   BUN 39 (H) 6 - 23 mg/dL   Creatinine, Ser 1.30 (H) 0.50 - 1.10 mg/dL   Glucose, Bld 120 (H) 70 - 99 mg/dL   Calcium, Ion 1.18 1.13 - 1.30 mmol/L   TCO2 28 0 - 100 mmol/L   Hemoglobin 12.9 12.0 - 15.0 g/dL   HCT 38.0 36.0 - 46.0 %    UA no evidence of UTI   No results found for: HGBA1C  CrCl cannot be calculated (Unknown ideal weight.).  BNP (last 3 results) No results for input(s): PROBNP in the last 8760 hours.  Other results:  I have pearsonaly reviewed this: ECG REPORT  Rate: 90   Rhythm: Normal sinus rhythm  ST&T Change: no ischemic changes   QTC 434   There were no vitals filed for this visit.   Cultures:    Component Value Date/Time   SDES URINE, CATHETERIZED 12/04/2013 1741   SPECREQUEST NONE 12/04/2013 1741   CULT NO GROWTH Performed at [facility] 12/04/2013 1741   REPTSTATUS 12/06/2013 FINAL 12/04/2013 1741     Radiological Exams on Admission: Dg Chest Port 1 View  07/17/2014   CLINICAL DATA:  Shortness of Breath beginning today. History of CHF, pneumonia.  EXAM: PORTABLE CHEST - 1 VIEW  COMPARISON:  11/26/2013  FINDINGS: Large hiatal hernia. Heart is borderline in size. Diffuse interstitial prominence with patchy bilateral alveolar opacities. Suspect edema/CHF. No effusions. No acute bony abnormality. Bilateral shoulder replacements noted.  IMPRESSION: Diffuse bilateral interstitial and alveolar opacities with borderline heart size. Suspect edema/ CHF.  Large hiatal hernia.   Electronically Signed   By:  Rolm Baptise M.D.   On: 07/17/2014 15:53    Chart has been reviewed  Family not  at  Bedside  plan of care was discussed with    Husband   Assessment/Plan  This 81 year old female history of systolic heart failure with EF of 55% echogram in 2015 as well as history of pulmonary hypertension who presents with hypoxia chest x-ray worrisome for CHF but also evidence of leukocytosis hyponatremia and worsening renal function  Present on Admission:  . Hypoxia - chest x-ray worrisome for CHF versus  atypical pneumonia. Patient was given Lasix in the emergency department currently improved.  Acute systolic heart failure  - patient had had echogram in 2015 showing evidence of systolic dysfunction as well as pulmonary hypertension. She is on Lasix 80 mg is needed. That she has been using for the past 2 weeks. BNP only slightly elevated at 159, currently there is no evidence of peripheral edema but patient has received Lasix in the emergency department cycle cardiac enzymes monitor on telemetry obtain echogram, obtain pro-calcitonin level to help differentiate CHF versus community-acquired pneumonia  . Leukocytosis- Will obtain CBC with differential no other evidence of infection at this point. It is unclear if chest x-ray finding could be atypical pneumonia will cover with Levaquin for now  . Anemia - chronic but currently somewhat worse from baseline. I'll obtain anemia panel and Hemoccult stool  . Hyponatremia - patient have had low sodium in the past, we'll obtain urine electrolytes. Patient's fluid status is somewhat unclear, able to tolerate attempt to obtain orthostatics try to clarify  . CAP (community acquired pneumonia) - it is unclear if patient has remained to acquire pneumonia versus heart failure. We'll repeat chest x-ray tomorrow. Lateral to clarify. For now will admit for treatment of CAP will start on appropriate antibiotic coverage.   Obtain sputum cultures, blood cultures if febrile or if  decompensates.  Provide oxygen as needed.  . Acute renal failure - patient has been on high-dose of Lasix in the past 2 weeks it is possible that she has slightly over diuresed herself. Campos status is not quite clear. Patient is hyponatremic. We'll obtain orthostatics on urine electrolytes decrease Lasix 40 a day and monitor urine output closely as well as renal function will obtain ultrasound to evaluate for any obstruction  . Elevated LFTs - will obtain ultrasound to evaluate for any evidence of liver disease, patient does drink 1-2 glasses of wine a day denies any history of withdrawal, albumin is 3.20 check INR Prophylaxis:  Lovenox if no evidence of coagulopathy Debility patient does report difficulty walking  CODE STATUS:    DNR/DNI as per patient   Disposition:    To home once workup is complete and patient is stable, given baseline trouble with ambulation will have PT OT evaluation for safe discharge  Other plan as per orders.  I have spent a total of 55 min on this admission  Keiandra Sullenger 07/17/2014, 6:40 PM  [facility]  Pager [phone]   after 2 AM please page floor coverage PA If 7AM-7PM, please contact the day team taking care of the patient  [email or web address]  Password [email or web address]

## 2014-07-17 NOTE — ED Notes (Signed)
Pt remains on 3 l o2 spo2 maintained at 94 %.

## 2014-07-18 ENCOUNTER — Inpatient Hospital Stay (HOSPITAL_COMMUNITY): Payer: PPO

## 2014-07-18 DIAGNOSIS — J9601 Acute respiratory failure with hypoxia: Secondary | ICD-10-CM

## 2014-07-18 DIAGNOSIS — G894 Chronic pain syndrome: Secondary | ICD-10-CM | POA: Diagnosis present

## 2014-07-18 DIAGNOSIS — R0600 Dyspnea, unspecified: Secondary | ICD-10-CM

## 2014-07-18 DIAGNOSIS — Z515 Encounter for palliative care: Secondary | ICD-10-CM

## 2014-07-18 DIAGNOSIS — I509 Heart failure, unspecified: Secondary | ICD-10-CM

## 2014-07-18 DIAGNOSIS — I5043 Acute on chronic combined systolic (congestive) and diastolic (congestive) heart failure: Principal | ICD-10-CM

## 2014-07-18 DIAGNOSIS — R7989 Other specified abnormal findings of blood chemistry: Secondary | ICD-10-CM

## 2014-07-18 DIAGNOSIS — J181 Lobar pneumonia, unspecified organism: Secondary | ICD-10-CM

## 2014-07-18 LAB — FOLATE: Folate: 17.1 ng/mL

## 2014-07-18 LAB — TROPONIN I

## 2014-07-18 LAB — TSH: TSH: 1.605 u[IU]/mL (ref 0.350–4.500)

## 2014-07-18 LAB — IRON AND TIBC
Iron: 12 ug/dL — ABNORMAL LOW (ref 42–145)
Saturation Ratios: 4 % — ABNORMAL LOW (ref 20–55)
TIBC: 274 ug/dL (ref 250–470)
UIBC: 262 ug/dL (ref 125–400)

## 2014-07-18 LAB — COMPREHENSIVE METABOLIC PANEL
ALBUMIN: 2.9 g/dL — AB (ref 3.5–5.2)
ALT: 70 U/L — ABNORMAL HIGH (ref 0–35)
AST: 56 U/L — ABNORMAL HIGH (ref 0–37)
Alkaline Phosphatase: 123 U/L — ABNORMAL HIGH (ref 39–117)
Anion gap: 9 (ref 5–15)
BILIRUBIN TOTAL: 0.6 mg/dL (ref 0.3–1.2)
BUN: 29 mg/dL — ABNORMAL HIGH (ref 6–23)
CALCIUM: 9.2 mg/dL (ref 8.4–10.5)
CO2: 33 mmol/L — ABNORMAL HIGH (ref 19–32)
Chloride: 88 mmol/L — ABNORMAL LOW (ref 96–112)
Creatinine, Ser: 1.19 mg/dL — ABNORMAL HIGH (ref 0.50–1.10)
GFR calc Af Amer: 48 mL/min — ABNORMAL LOW (ref >90)
GFR, EST NON AFRICAN AMERICAN: 42 mL/min — AB (ref >90)
Glucose, Bld: 112 mg/dL — ABNORMAL HIGH (ref 70–99)
Potassium: 3.9 mmol/L (ref 3.5–5.1)
Sodium: 130 mmol/L — ABNORMAL LOW (ref 135–145)
Total Protein: 6.8 g/dL (ref 6.0–8.3)

## 2014-07-18 LAB — URINE CULTURE
Colony Count: NO GROWTH
Culture: NO GROWTH

## 2014-07-18 LAB — CREATININE, URINE, RANDOM: Creatinine, Urine: 14.2 mg/dL

## 2014-07-18 LAB — CBC WITH DIFFERENTIAL/PLATELET
BASOS ABS: 0 10*3/uL (ref 0.0–0.1)
Basophils Relative: 0 % (ref 0–1)
EOS ABS: 0.2 10*3/uL (ref 0.0–0.7)
Eosinophils Relative: 1 % (ref 0–5)
HCT: 30 % — ABNORMAL LOW (ref 36.0–46.0)
Hemoglobin: 9.8 g/dL — ABNORMAL LOW (ref 12.0–15.0)
LYMPHS ABS: 1.5 10*3/uL (ref 0.7–4.0)
Lymphocytes Relative: 9 % — ABNORMAL LOW (ref 12–46)
MCH: 32.7 pg (ref 26.0–34.0)
MCHC: 32.7 g/dL (ref 30.0–36.0)
MCV: 100 fL (ref 78.0–100.0)
Monocytes Absolute: 1.1 10*3/uL — ABNORMAL HIGH (ref 0.1–1.0)
Monocytes Relative: 6 % (ref 3–12)
NEUTROS PCT: 84 % — AB (ref 43–77)
Neutro Abs: 14.7 10*3/uL — ABNORMAL HIGH (ref 1.7–7.7)
PLATELETS: 311 10*3/uL (ref 150–400)
RBC: 3 MIL/uL — AB (ref 3.87–5.11)
RDW: 13.5 % (ref 11.5–15.5)
WBC: 17.5 10*3/uL — ABNORMAL HIGH (ref 4.0–10.5)

## 2014-07-18 LAB — STREP PNEUMONIAE URINARY ANTIGEN: Strep Pneumo Urinary Antigen: NEGATIVE

## 2014-07-18 LAB — GLUCOSE, CAPILLARY: Glucose-Capillary: 132 mg/dL — ABNORMAL HIGH (ref 70–99)

## 2014-07-18 LAB — SODIUM, URINE, RANDOM: Sodium, Ur: 47 mEq/L

## 2014-07-18 LAB — RETICULOCYTES
RBC.: 3 MIL/uL — ABNORMAL LOW (ref 3.87–5.11)
RETIC CT PCT: 1.7 % (ref 0.4–3.1)
Retic Count, Absolute: 51 10*3/uL (ref 19.0–186.0)

## 2014-07-18 LAB — VITAMIN B12: Vitamin B-12: 904 pg/mL (ref 211–911)

## 2014-07-18 LAB — MAGNESIUM: Magnesium: 2.5 mg/dL (ref 1.5–2.5)

## 2014-07-18 LAB — PHOSPHORUS: PHOSPHORUS: 3.7 mg/dL (ref 2.3–4.6)

## 2014-07-18 LAB — FERRITIN: FERRITIN: 128 ng/mL (ref 10–291)

## 2014-07-18 NOTE — Consult Note (Signed)
Patient TD:VVOHY Truc Winfree      DOB: November 04, 1930      WVP:[id number]     Consult Note from the Palliative Medicine Team at [facility] Requested by: Dr Charlies Silvers     PCP: Tommy Medal, MD Reason for Consultation: [place]    Phone Number:[phone]  Assessment/Recommendations: 81 yo female with CHF, chronic pain admitted with dyspnea and CXR concerning for pulm edema.    1.  Code Status: DNR documented  2. GOC: Spoke with Jailani and her husband Rush Landmark today.  Michiko was previously on hospice, reportedly for CHF but discharged in Sept because she did not meet hospice criteria.  This looks to be her first CHF hospitalization since then and she tells me today that she had not taken her lasix in 1 week because it makes her go to the bathroom too frequently.  It is difficult for her to get to bathroom because of her arthritis.  She never wants to go to nursing home and plans on returning home with her husband after this stay.  She is frustrated by being in the hospital but would come in to get CHF treated, PNA's, etc. She just doesn't particularly like being here.  Unless she has had significant change in her cardiac function (generally EF <25%) on echo or has repeated CHF admissions from here on out, I suspect she will not meet hospice criteria at this time.  I think when she does, a hospice philosophy would be of interest to them as she would prefer to stay out of hospital if these issues could be managed at home.  With her history of not taking lasix and last admission in January for orthopedic procedure, i think she wont be eligible until she has further decline from here.  Her goal is to return home with home health. Her husband states that things have been going okay at home and while has had general decline over years, not a lot of change since Sept.    3. Symptom Management:   1. Chronic Pain (Back/Arthirtic joints): Sees pain physician in [place]. Would continue home regimen of methadone  and oxycodone PRN.  2. Dyspnea: improving with diuresis. Correlated improvement in CXR as well.  4. Psychosocial/Spiritual: Lives at home with her husband Rush Landmark. They have 4 children who live out of town.      Brief HPI: 81 yo female with PMHx of HFpEF, chronic back pain, arthritis who was admitted with a 1 day history of dyspnea.  She was found to be hypoxic on RA in ED and CXR concerning for pulm edema.  There was some mention of her increasing her lasix at home, but she actually told me today that she has not taken her lasix in 1 week because it frustrates her to have to pee all the time. She has had some lower ext edema at home which is a chronic issue for her.  She tells met that she takes lasix normally as needed for this edema.  On admit she was noted to also have mild AKI, LFT abnormality and hyponatremia. She denies any other events out of the ordinary. Avoids salty foods.  Has chronic back pain and arthritis which keeps her in bed most of the time. Sees a pain physician in [place] who controls her pain with methadone. At one time was on MS contin $RemoveB'100mg'[id number]$  TID and switched to methadone after overdose per her husband.  No other acute complaints today. Chronic joint pains unchanged. Breathing feeling  better. Denies N/V, diarrhea/constipation.     PMH:  Past Medical History  Diagnosis Date  . Arthritis   . CHF (congestive heart failure)   . Pneumonia   . Depression   . GERD (gastroesophageal reflux disease)      PSH: Past Surgical History  Procedure Laterality Date  . Abdominal hysterectomy    . Joint replacement Left     shoulder  . Reverse shoulder arthroplasty Right 12/04/2013  . Reverse shoulder arthroplasty  12/04/2013    Procedure: REVERSE SHOULDER ARTHROPLASTY;  Surgeon: Nita Sells, MD;  Location: [facility] OR;  Service: Orthopedics;;  Right reverse total shoulder arthroplasty  . Back surgery  2000  . Colon surgery      x2-blockage  . Hernia repair  2012  .  Orif wrist fracture  2012    left  . Ercp    . Colonoscopy    . Rotator cuff repair      dr Tamera Punt  . Shoulder arthroscopy  2012    lt and rt  . Tendon repair Right 04/13/2014    Procedure: RIGHT HAND CENTRAL TENDON CENTRALIZATION OF LONG AND RING FINGERS;  Surgeon: Jolyn Nap, MD;  Location: [facility];  Service: Orthopedics;  Laterality: Right;   I have reviewed the [place] and SH and  If appropriate update it with new information. No Known Allergies Scheduled Meds: . DULoxetine  60 mg Oral QHS  . enoxaparin (LOVENOX) injection  30 mg Subcutaneous Q24H  . fluticasone  1 spray Each Nare Daily  . gabapentin  400 mg Oral BID  . [START ON 07/19/2014] levofloxacin (LEVAQUIN) IV  750 mg Intravenous Q48H  . methadone  5 mg Oral 3 times per day  . sodium chloride  3 mL Intravenous Q12H  . timolol  1 drop Both Eyes Daily   Continuous Infusions:  PRN Meds:.sodium chloride, acetaminophen, bisacodyl, ondansetron (ZOFRAN) IV, oxyCODONE-acetaminophen **AND** oxyCODONE, sodium chloride, temazepam    BP 118/55 mmHg  Pulse 88  Temp(Src) 99 F (37.2 C) (Oral)  Resp 16  Ht 5' (1.524 m)  Wt 86.365 kg (190 lb 6.4 oz)  BMI 37.19 kg/m2  SpO2 98%   PPS: 50   Intake/Output Summary (Last 24 hours) at 07/18/14 1319 Last data filed at 07/18/14 0735  Gross per 24 hour  Intake    360 ml  Output   3150 ml  Net  -2790 ml    Physical Exam:  General: Alert, NAD HEENT:  [place], sclera anicteric, mmm Neck: supple Chest:   CTAB CVS: RRR Abdomen: obese, soft, NT Ext:  edema Neuro: oriented x3  Labs: CBC    Component Value Date/Time   WBC 17.5* 07/18/2014 0649   RBC 3.00* 07/18/2014 0649   RBC 3.00* 07/18/2014 0649   HGB 9.8* 07/18/2014 0649   HCT 30.0* 07/18/2014 0649   PLT 311 07/18/2014 0649   MCV 100.0 07/18/2014 0649   MCH 32.7 07/18/2014 0649   MCHC 32.7 07/18/2014 0649   RDW 13.5 07/18/2014 0649   LYMPHSABS 1.5 07/18/2014 0649   MONOABS 1.1* 07/18/2014 0649    EOSABS 0.2 07/18/2014 0649   BASOSABS 0.0 07/18/2014 0649    BMET    Component Value Date/Time   NA 130* 07/18/2014 0649   K 3.9 07/18/2014 0649   CL 88* 07/18/2014 0649   CO2 33* 07/18/2014 0649   GLUCOSE 112* 07/18/2014 0649   BUN 29* 07/18/2014 0649   CREATININE 1.19* 07/18/2014 0649   CALCIUM 9.2 07/18/2014  [place] 07/18/2014 0649   GFRAA 48* 07/18/2014 0649    CMP     Component Value Date/Time   NA 130* 07/18/2014 0649   K 3.9 07/18/2014 0649   CL 88* 07/18/2014 0649   CO2 33* 07/18/2014 0649   GLUCOSE 112* 07/18/2014 0649   BUN 29* 07/18/2014 0649   CREATININE 1.19* 07/18/2014 0649   CALCIUM 9.2 07/18/2014 0649   PROT 6.8 07/18/2014 0649   ALBUMIN 2.9* 07/18/2014 0649   AST 56* 07/18/2014 0649   ALT 70* 07/18/2014 0649   ALKPHOS 123* 07/18/2014 0649   BILITOT 0.6 07/18/2014 0649   GFRNONAA 42* 07/18/2014 0649   GFRAA 48* 07/18/2014 0649   4/30 ABD US IMPRESSION: 1. Possible small noncalcified gallstones versus foci of sludge within the gallbladder. No other evidence for acute cholecystitis. 2. Mild prominence of the common bile duct without visible choledocholithiasis. 3. Mildly echogenic liver. 4. No hydronephrosis.  4/30 CXR IMPRESSION: 1. Improved aeration of lungs suggestive of improved pulmonary edema. No new focal airspace opacities. 2. Similar findings of suspected large hiatal hernia.   Total Time: 60 minutes Greater than 50%  of this time was spent counseling and coordinating care related to the above assessment and plan.   Doran Clay D.O. Palliative Medicine Team at [facility]  Pager: [phone] Team Phone: [phone]

## 2014-07-18 NOTE — Progress Notes (Signed)
Patient ID: Monique Russo, female   DOB: 07-Mar-1931, 81 y.o.   MRN: [id number] [facility] PROGRESS NOTE  Monique Russo [id number] DOB: 11-17-1930 DOA: 07/17/2014 PCP: Tommy Medal, MD  Brief narrative:    81 y.o. female with past medical history of chronic combined systolic and diastolic CHF (last 2 D ECHO on file 05/2013 showed EF of 50%), depression, dyslipidemia who presented to [facility] ED with concern for ongoing shortness of breath for 24 hours prior to this admission. Her O2 sat per EMS was in 70's.  In ED. Pt was hemodynamically stable except for O2 sats of 79% on room air. It has however improved to mid 90's with 4 L [place] oxygen support. CXR concerning for pulmonary edema for which she received lasix 40 mg IV once. She was also started on Levaquin for possible pneumonia since she had low grade fever of 99 F and leukocytosis of 20.9. Other relevant blood work included sodium of 128, creatinine 1.3 and slight elevation in liver enzymes.  Barrier to discharge: Still leukocytosis although improving. She is on IV Levaquin and would recommend to continue this until WBC count trends closer to WNL.  Assessment/Plan:    Principal Problem:   Acute respiratory failure with hypoxia / Acute on chronic combined systolic and diastolic CHF (congestive heart failure) - Hypoxia on admission likely related to CHF etiology considering CXR findings that showed suspected edema and CHF. Repeat CXR showed improving edema. She has received lasix 40 mg IV once in ED. - Of note BNP was only mildly elevated at 159 but could be artificially low due to obesity. 2 D ECHO in 05/2013 showed EF of 50%. Will get 2 D ECHO on this admission. - Daily weight and strict intake and output - Respiratory status stable this morning. Oxygen saturation is 98% on room air.  Active Problems: Lobar pneumonia / Leukocytosis - Empiric treatment with Levaquin initiated even though no conclusive finding of pneumonia on CXR but  pt did have low grade fever of 99 F and leukocytosis of 20.9 - Strep pneumonia negative. Legionella negative.   Hyponatremia - Likely from CHF etiology - Improved since admission, sodium 130 this am  Acute renal failure - Possibly from lasix, she is on lasix at home and she received dose in ED - Mild elevation in creatinine and improving since admission, 1.3 --> 1.19  Normocytic anemia - Hemoglobin stable at 9.8 - No current indications for transfusion   Elevated LFTs - No abdominal pain - Abd [place] pending.   Depression - Continue Cymbalta   DVT Prophylaxis  - Lovenox subQ while pt is in hospital    Code Status: DNR/DNI Family Communication:  plan of care discussed with the patient; family not at the bedside. Disposition Plan: needs PT eval; continue Levaquin for possible pneumonia.   IV access:  Peripheral IV  Procedures and diagnostic studies:    Dg Chest 2 View 07/18/2014  1. Improved aeration of lungs suggestive of improved pulmonary edema. No new focal airspace opacities. 2. Similar findings of suspected large hiatal hernia.    Dg Chest Port 1 View 07/17/2014 Diffuse bilateral interstitial and alveolar opacities with borderline heart size. Suspect edema/ CHF.  Large hiatal hernia.     Medical Consultants:  None   Other Consultants:  Physical therapy   IAnti-Infectives:   Levaquin 07/17/2014 -->   Leisa Lenz, MD  [facility] Pager [phone]  Time spent in minutes: 25 minutes  If 7PM-7AM, please contact night-coverage [email or web address] Password [email or web address]  07/18/2014, 11:57 AM   LOS: 1 day    HPI/Subjective: No acute overnight events. Patient reports feeling better this am. No cough.   Objective: Filed Vitals:   07/17/14 1930 07/17/14 2021 07/17/14 2053 07/17/14 2101  BP: 101/50 111/52  118/55  Pulse: 80 84  88  Temp:  98.7 F (37.1 C)  99 F (37.2 C)  TempSrc:  Oral  Oral  Resp: 16 16  16   Height:   5' (1.524 m)   Weight:   86.365 kg (190 lb  6.4 oz)   SpO2: 99% 99%  98%    Intake/Output Summary (Last 24 hours) at 07/18/14 1157 Last data filed at 07/18/14 0735  Gross per 24 hour  Intake    360 ml  Output   3150 ml  Net  -2790 ml    Exam:   General:  Pt is alert, not in acute distress  Cardiovascular: Regular rate and rhythm, S1/S2 (+)  Respiratory: Clear to auscultation bilaterally, no wheezing, no crackles, no rhonchi  Abdomen: Soft, non tender, non distended, bowel sounds present  Extremities: LE pitting edema, pulses  palpable bilaterally  Neuro: Grossly nonfocal  Data Reviewed: Basic Metabolic Panel:  Recent Labs Lab 07/17/14 1700 07/17/14 1830 07/17/14 2115 07/18/14 0649  NA 128* 126*  --  130*  K 4.8 4.9  --  3.9  CL 88* 88*  --  88*  CO2 31  --   --  33*  GLUCOSE 125* 120*  --  112*  BUN 30* 39*  --  29*  CREATININE 1.24* 1.30* 1.18* 1.19*  CALCIUM 9.8  --   --  9.2  MG  --   --   --  2.5  PHOS  --   --   --  3.7   Liver Function Tests:  Recent Labs Lab 07/17/14 1700 07/18/14 0649  AST 103* 56*  ALT 95* 70*  ALKPHOS 134* 123*  BILITOT 0.6 0.6  PROT 7.0 6.8  ALBUMIN 3.2* 2.9*   No results for input(s): LIPASE, AMYLASE in the last 168 hours. No results for input(s): AMMONIA in the last 168 hours. CBC:  Recent Labs Lab 07/17/14 1700 07/17/14 1830 07/17/14 2115 07/18/14 0649  WBC 20.8*  --  20.9* 17.5*  NEUTROABS 18.8*  --  18.7* 14.7*  HGB 10.6* 12.9 10.8* 9.8*  HCT 32.1* 38.0 31.9* 30.0*  MCV 100.3*  --  100.0 100.0  PLT 335  --  319 311   Cardiac Enzymes:  Recent Labs Lab 07/17/14 2115 07/18/14 0649  TROPONINI <0.03 <0.03   BNP: Invalid input(s): POCBNP CBG: No results for input(s): GLUCAP in the last 168 hours.  No results found for this or any previous visit (from the past 240 hour(s)).   Scheduled Meds: . DULoxetine  60 mg Oral QHS  . enoxaparin (LOVENOX) injection  30 mg Subcutaneous Q24H  . fluticasone  1 spray Each Nare Daily  . gabapentin  400 mg  Oral BID  . [START ON 07/19/2014] levofloxacin (LEVAQUIN) IV  750 mg Intravenous Q48H  . methadone  5 mg Oral 3 times per day   Continuous Infusions:

## 2014-07-18 NOTE — Progress Notes (Signed)
Echocardiogram 2D Echocardiogram has been performed.  Monique Russo 07/18/2014, 12:23 PM

## 2014-07-19 LAB — BASIC METABOLIC PANEL
ANION GAP: 9 (ref 5–15)
BUN: 22 mg/dL — AB (ref 6–20)
CO2: 31 mmol/L (ref 22–32)
Calcium: 9.4 mg/dL (ref 8.9–10.3)
Chloride: 92 mmol/L — ABNORMAL LOW (ref 101–111)
Creatinine, Ser: 1.01 mg/dL — ABNORMAL HIGH (ref 0.44–1.00)
GFR calc non Af Amer: 51 mL/min — ABNORMAL LOW (ref >60)
GFR, EST AFRICAN AMERICAN: 59 mL/min — AB (ref >60)
GLUCOSE: 108 mg/dL — AB (ref 70–99)
Potassium: 3.7 mmol/L (ref 3.5–5.1)
SODIUM: 132 mmol/L — AB (ref 135–145)

## 2014-07-19 LAB — EXPECTORATED SPUTUM ASSESSMENT W REFEX TO RESP CULTURE

## 2014-07-19 LAB — EXPECTORATED SPUTUM ASSESSMENT W GRAM STAIN, RFLX TO RESP C

## 2014-07-19 LAB — OSMOLALITY, URINE: OSMOLALITY UR: 212 mosm/kg — AB (ref 390–1090)

## 2014-07-19 MED ORDER — GABAPENTIN 400 MG PO CAPS
400.0000 mg | ORAL_CAPSULE | Freq: Two times a day (BID) | ORAL | Status: DC
  Administered 2014-07-19 – 2014-07-20 (×2): 400 mg via ORAL
  Filled 2014-07-19 (×2): qty 1

## 2014-07-19 MED ORDER — FUROSEMIDE 40 MG PO TABS
80.0000 mg | ORAL_TABLET | Freq: Every day | ORAL | Status: DC
  Administered 2014-07-19 – 2014-07-20 (×2): 80 mg via ORAL
  Filled 2014-07-19 (×2): qty 2

## 2014-07-19 NOTE — Care Management Note (Signed)
Case Management Note  Patient Details  Name: Monique Russo MRN: [id number] Date of Birth: 25-Jul-1930  Subjective/Objective:    81 y/o f admitted w/Acute respiratory failure w/hypoxia.                Action/Plan: Monitor progress for d/c plans.Anticipated d/c plan return home w/prior [facility] services.   Expected Discharge Date:   (unknown)               Expected Discharge Plan:  [place]  In-House Referral:     Discharge planning Services     Post Acute Care Choice:  Home Health Choice offered to:     DME Arranged:    DME Agency:     HH Arranged:    HH Agency:     Status of Service:  In process, will continue to follow  Medicare Important Message Given:    Date Medicare IM Given:    Medicare IM give by:    Date Additional Medicare IM Given:    Additional Medicare Important Message give by:     If discussed at [place] of Stay Meetings, dates discussed:    Additional Comments:  Dessa Phi, RN 07/19/2014, 4:44 PM

## 2014-07-19 NOTE — Progress Notes (Addendum)
Patient ID: Monique Russo, female   DOB: 1931-02-14, 81 y.o.   MRN: [id number] [facility] PROGRESS NOTE  Monique Russo [id number]:[id number] DOB: November 07, 1930 DOA: 07/17/2014 PCP: Tommy Medal, MD  Brief narrative:    81 y.o. female with past medical history of chronic combined systolic and diastolic CHF (last 2 D ECHO on file 05/2013 showed EF of 50%), depression, dyslipidemia who presented to [facility] ED with concern for ongoing shortness of breath for 24 hours prior to this admission. Her O2 sat per EMS was in 70's.  In ED. Pt was hemodynamically stable except for O2 sats of 79% on room air. It has however improved to mid 90's with 4 L [place] oxygen support. CXR was concerning for pulmonary edema for which she received lasix 40 mg IV once. She was also started on Levaquin for possible pneumonia since she had low grade fever of 99 F and leukocytosis of 20.9. Other relevant blood work included sodium of 128, creatinine 1.3 and slight elevation in liver enzymes.  Barrier to discharge: Continue Levaquin for possible pneumonia. Anticipate discharge 07/20/2014 with home health (orders in place).  Assessment/Plan:    Principal Problem:   Acute respiratory failure with hypoxia / Acute on chronic combined systolic and diastolic CHF (congestive heart failure) - Hypoxia on admission likely secondary to CHF etiology and possible pneumonia. - CXR on admission showed edema and CHF. She has received lasix 40 mg IV on admission. Her BNP was only mildly elevated at 159 (could be artifically low due to morbid obesity. - 2 D ECHO on this admission showed EF of 55% and normal LV diastolic function parameters. - We did not give any other lasix due to BP of 94/59. BP in 120's range this am so we will resume her lasix dose 80 mg daily. - Her weight in past 48 hours: 86.36 kg --> 85.41 kg - Continue daily weight and strict intake and output - Replete electrolytes as indicated.   Active Problems: Lobar pneumonia /  Leukocytosis - Empiric treatment with Levaquin initiated even though no conclusive finding of pneumonia on CXR. However, on admission, pt did have low grade fever of 99 F and leukocytosis of 20.9. - Leukocytosis is improving.  _ Levaquin is renally adjusted to be given every other day.  - Strep pneumonia negative. Legionella negative.   Hyponatremia - Likely from CHF etiology - Improved since admission, sodium 132 this morning.   Acute renal failure - Possibly from lasix, she is on lasix at home and she received dose in ED. Her renal function improved. - We will resume lasix today. Follow up BMP tomorrow am.  - Creatinine improving since admission, 1.3 --> 1.19 --> 1.01  Normocytic anemia - Hemoglobin stable at 9.8 - No current indications for transfusion   Elevated LFTs - No abdominal pain - Abd US showed possible small noncalcified gallstone but no evidence of acute cholecystitis.   Depression - Continue Cymbalta - Stable - She does not feel depressed at this time.    DVT Prophylaxis  - Lovenox subQ while pt is in hospital    Code Status: DNR/DNI Family Communication:  plan of care discussed with the patient; family not at the bedside. Disposition Plan: home likely in next 24 hours.   IV access:  Peripheral IV  Procedures and diagnostic studies:    Dg Chest 2 View 07/18/2014  1. Improved aeration of lungs suggestive of improved pulmonary edema. No new focal airspace opacities. 2. Similar findings of suspected large hiatal  hernia.    Dg Chest Port 1 View 07/17/2014 Diffuse bilateral interstitial and alveolar opacities with borderline heart size. Suspect edema/ CHF.  Large hiatal hernia.    Medical Consultants:  Palliative care   Other Consultants:  Physical therapy   IAnti-Infectives:   Levaquin 07/17/2014 -->   Leisa Lenz, MD  [facility] Pager [phone]  Time spent in minutes: 25 minutes  If 7PM-7AM, please contact  night-coverage [email or web address] Password [email or web address] 07/19/2014, 12:26 PM   LOS: 2 days    HPI/Subjective: No acute overnight events. Patient reprots feeling tires. Says she gets short of breath with exertion but stable while at rest.   Objective: Filed Vitals:   07/17/14 2101 07/18/14 1340 07/18/14 2051 07/19/14 0444  BP: 118/55 94/59 117/53 129/67  Pulse: 88 75 83 71  Temp: 99 F (37.2 C) 98.3 F (36.8 C) 98.4 F (36.9 C) 98.2 F (36.8 C)  TempSrc: Oral Oral Oral Oral  Resp: 16 18 18 18   Height:      Weight:    85.412 kg (188 lb 4.8 oz)  SpO2: 98%  91% 93%    Intake/Output Summary (Last 24 hours) at 07/19/14 1226 Last data filed at 07/19/14 0600  Gross per 24 hour  Intake    600 ml  Output    950 ml  Net   -350 ml    Exam:   General:  Pt is not in acute distress  Cardiovascular: Rate controlled, S1/S2 appreciated   Respiratory: bilateral air entry, no wheezing   Abdomen: obese, non tender to palpation, (+) BS  Extremities: LE pitting edema, no cyanosis, palpable pulses   Neuro: Nonfocal  Data Reviewed: Basic Metabolic Panel:  Recent Labs Lab 07/17/14 1700 07/17/14 1830 07/17/14 2115 07/18/14 0649 07/19/14 0501  NA 128* 126*  --  130* 132*  K 4.8 4.9  --  3.9 3.7  CL 88* 88*  --  88* 92*  CO2 31  --   --  33* 31  GLUCOSE 125* 120*  --  112* 108*  BUN 30* 39*  --  29* 22*  CREATININE 1.24* 1.30* 1.18* 1.19* 1.01*  CALCIUM 9.8  --   --  9.2 9.4  MG  --   --   --  2.5  --   PHOS  --   --   --  3.7  --    Liver Function Tests:  Recent Labs Lab 07/17/14 1700 07/18/14 0649  AST 103* 56*  ALT 95* 70*  ALKPHOS 134* 123*  BILITOT 0.6 0.6  PROT 7.0 6.8  ALBUMIN 3.2* 2.9*   No results for input(s): LIPASE, AMYLASE in the last 168 hours. No results for input(s): AMMONIA in the last 168 hours. CBC:  Recent Labs Lab 07/17/14 1700 07/17/14 1830 07/17/14 2115 07/18/14 0649  WBC 20.8*  --  20.9* 17.5*  NEUTROABS 18.8*  --  18.7* 14.7*  HGB 10.6*  12.9 10.8* 9.8*  HCT 32.1* 38.0 31.9* 30.0*  MCV 100.3*  --  100.0 100.0  PLT 335  --  319 311   Cardiac Enzymes:  Recent Labs Lab 07/17/14 2115 07/18/14 0649 07/18/14 1315  TROPONINI <0.03 <0.03 <0.03   BNP: Invalid input(s): POCBNP CBG:  Recent Labs Lab 07/18/14 1256  GLUCAP 132*    Blood culture (routine x 2)     Status: None (Preliminary result)   Collection Time: 07/17/14  6:22 PM  Result Value Ref Range Status   Specimen Description BLOOD RIGHT HAND  Final  Special Requests BOTTLES DRAWN AEROBIC ONLY 5ML  Final   Culture   Final           BLOOD CULTURE RECEIVED NO GROWTH TO DATE     Report Status PENDING  Incomplete  Blood culture (routine x 2)     Status: None (Preliminary result)   Collection Time: 07/17/14  6:23 PM  Result Value Ref Range Status   Specimen Description BLOOD RIGHT ARM  Final   Special Requests BOTTLES DRAWN AEROBIC AND ANAEROBIC 5CC  Final   Culture   Final           BLOOD CULTURE RECEIVED NO GROWTH TO DATE     Report Status PENDING  Incomplete  Culture, Urine     Status: None   Collection Time: 07/17/14 10:46 PM  Result Value Ref Range Status   Specimen Description URINE, CATHETERIZED  Final   Special Requests NONE  Final   Culture NO GROWTH Performed at [facility]   Final   Report Status 07/18/2014 FINAL  Final  Culture, sputum-assessment     Status: None   Collection Time: 07/19/14  2:41 AM  Result Value Ref Range Status   Specimen Description SPUTUM  Final   Special Requests NONE  Final   Sputum evaluation   Final    THIS SPECIMEN IS ACCEPTABLE. RESPIRATORY CULTURE REPORT TO FOLLOW.   Report Status 07/19/2014 FINAL  Final     Scheduled Meds: . DULoxetine  60 mg Oral QHS  . enoxaparin (LOVENOX) injection  30 mg Subcutaneous Q24H  . fluticasone  1 spray Each Nare Daily  . gabapentin  400 mg Oral BID  .  (LEVAQUIN) IV  750 mg Intravenous Q48H  . methadone  5 mg Oral 3 times per day

## 2014-07-19 NOTE — Evaluation (Addendum)
Occupational Therapy Evaluation Patient Details Name: Monique Russo MRN: [id number] DOB: [birth year] Today's Date: 07/19/2014    History of Present Illness 81 yo female admitted with acute resp failure. hx of CHF, arthritis, chronic back pain, R shoulder surgery   Clinical Impression   Pt was admitted for the above.  She has assistance with adls but reports that she walks to bathroom at mod I level.  Pt was unsafe with SPT today.  Will follow in acute with focus on toilet transfers, safely at min guard level.      Follow Up Recommendations  Supervision/Assistance - 24 hour;Home health OT  Pt is declining placement   Equipment Recommendations  None recommended by OT    Recommendations for Other Services       Precautions / Restrictions Precautions Precautions: Fall Restrictions Weight Bearing Restrictions: No      Mobility Bed Mobility Overal bed mobility: Needs Assistance Bed Mobility: Supine to Sit      Sit to supine: Min assist   General bed mobility comments: assist for RLE  Transfers Overall transfer level: Needs assistance Equipment used: Rolling walker (2 wheeled) Transfers: Sit to/from [facility] Sit to Stand: Min assist Stand pivot transfers: Min assist       General transfer comment: assist to rise and steady.  Pt did not use RW safely:  placed on hand on this and one on armrest of chair.  Cued her that both hands on walker would be safer and help her to decrease pressure/weight on R knee    Balance                                            ADL Overall ADL's : Needs assistance/impaired                         Toilet Transfer: Minimal assistance;Stand-pivot (back to bed--unsafe technique)             General ADL Comments: pt states that she has help for LB ADLs.  She typically walks to the bathroom, which is "way down the hall".  Encouraged her to use BSC at bedside, if needed due to  urgency/pain.  Also concerned that she may not be safe based on evaluation today     Vision     Perception     Praxis      Pertinent Vitals/Pain Pain Assessment: Faces Faces Pain Scale: Hurts whole lot Pain Location: R knee Pain Descriptors / Indicators: Aching Pain Intervention(s): Limited activity within patient's tolerance;Monitored during session;Repositioned;Patient requesting pain meds-RN notified     Hand Dominance Right   Extremity/Trunk Assessment Upper Extremity Assessment Upper Extremity Assessment: Generalized weakness (arthritis changes )  Pt was able to lift R shoulder to 90 and L to 110 degrees      Cervical / Trunk Assessment Cervical / Trunk Assessment: Kyphotic   Communication Communication Communication: No difficulties   Cognition Arousal/Alertness: Awake/alert Behavior During Therapy: WFL for tasks assessed/performed Overall Cognitive Status: Within Functional Limits for tasks assessed (decreased safety)                     General Comments       Exercises       Shoulder Instructions      Home Living Family/patient expects to be discharged to:: Private residence Living  Arrangements: Spouse/significant other Available Help at Discharge: Family;Available 24 hours/day;Personal care attendant Type of Home: House Home Access: Stairs to enter [employer] of Steps: 1   Home Layout: One level     Bathroom Shower/Tub: Occupational psychologist: Standard     Home Equipment: Bedside commode;Wheelchair - power;Walker - 2 wheels;Wheelchair - Banker          Prior Functioning/Environment Level of Independence: Needs assistance    ADL's / Homemaking Assistance Needed: aise assists with bathing and family assists with dressing        OT Diagnosis: Acute pain;Generalized weakness   OT Problem List: Decreased strength;Decreased activity tolerance;Impaired balance (sitting and/or  standing);Decreased safety awareness;Pain   OT Treatment/Interventions: Self-care/ADL training;DME and/or AE instruction;Patient/family education;Balance training    OT Goals(Current goals can be found in the care plan section) Acute Rehab OT Goals Patient Stated Goal: home soon OT Goal Formulation: With patient Time For Goal Achievement: 07/26/14 Potential to Achieve Goals: Good ADL Goals Pt Will Perform Grooming: with supervision;standing Pt Will Transfer to Toilet: with min guard assist;ambulating;stand pivot transfer;bedside commode (both by bedside and ambulating to bathroom with safe technique) Pt Will Perform Toileting - Clothing Manipulation and hygiene: with supervision;sit to/from stand  OT Frequency: Min 2X/week   Barriers to D/C:            Co-evaluation              End of Session    Activity Tolerance: No increased pain Patient left: in bed;with call bell/phone within reach   Time: 1000-1012 OT Time Calculation (min): 12 min Charges:  OT General Charges $OT Visit: 1 Procedure OT Evaluation $Initial OT Evaluation Tier I: 1 Procedure G-Codes:    Byrne Capek 2014-07-25, 11:40 AM  Monique Russo, OTR/L [phone] 07-25-14

## 2014-07-19 NOTE — Evaluation (Signed)
Physical Therapy Evaluation Patient Details Name: Yuki Brunsman MRN: [id number] DOB: 03-24-1930 Today's Date: 07/19/2014   History of Present Illness  81 yo female admitted with acute resp failure. hx of CHF, arthritis, chronic back pain, R shoulder surgery  Clinical Impression  On eval, pt required Min assist for mobility-able to perform stand pivot and take a few steps in room although with significant difficulty due to back pain and R knee pain. Assisted pt to [facility] then to recliner. Recommend HHPT and 24 hour supervision/assist at d/c. May need to consider ST rehab if mobility doesn't improve however pt will likely not agree to this.     Follow Up Recommendations Home health PT;Supervision/Assistance - 24 hour (pt declining placement)    Equipment Recommendations  None recommended by PT    Recommendations for Other Services OT consult     Precautions / Restrictions Precautions Precautions: Fall Restrictions Weight Bearing Restrictions: No      Mobility  Bed Mobility Overal bed mobility: Needs Assistance Bed Mobility: Supine to Sit     Supine to sit: HOB elevated;Supervision     General bed mobility comments: Increased time.   Transfers Overall transfer level: Needs assistance Equipment used: Rolling walker (2 wheeled) Transfers: Sit to/from [facility] Sit to Stand: Min assist Stand pivot transfers: Min assist       General transfer comment: Assist to rise, stabilize, control descent. VCs safety, technique. Pt tends to push walker away and grab onto objects in environment to hold on to.   Ambulation/Gait Ambulation/Gait assistance: Min assist Ambulation Distance (Feet): 4 Feet Assistive device: Rolling walker (2 wheeled) Gait Pattern/deviations: Step-to pattern;Trunk flexed;Antalgic     General Gait Details: Assist to stabilize pt. Again pt tends to push walker away and grab on objects in environment to hold on to, despite cueing from  therapist.  Limited by R knee pain.   Stairs            Wheelchair Mobility    Modified Rankin (Stroke Patients Only)       Balance                                             Pertinent Vitals/Pain Pain Assessment: Faces Faces Pain Scale: Hurts whole lot Pain Location: back, R knee Pain Descriptors / Indicators: Aching;Sore;Sharp Pain Intervention(s): Limited activity within patient's tolerance;Repositioned    Home Living Family/patient expects to be discharged to:: Private residence Living Arrangements: Spouse/significant other Available Help at Discharge: Family;Available 24 hours/day;Personal care attendant (2x/week aide) Type of Home: House Home Access: Stairs to enter   Entrance Stairs-Number of Steps: 1 Home Layout: One level Home Equipment: Bedside commode;Wheelchair - power;Walker - 2 wheels;Wheelchair - Banker      Prior Function Level of Independence: Needs assistance      ADL's / Homemaking Assistance Needed: aise assists with bathing        Hand Dominance   Dominant Hand: Right    Extremity/Trunk Assessment   Upper Extremity Assessment: Defer to OT evaluation           Lower Extremity Assessment: Generalized weakness      Cervical / Trunk Assessment: Kyphotic  Communication   Communication: No difficulties  Cognition Arousal/Alertness: Awake/alert Behavior During Therapy: WFL for tasks assessed/performed Overall Cognitive Status: Within Functional Limits for tasks assessed  General Comments      Exercises        Assessment/Plan    PT Assessment Patient needs continued PT services  PT Diagnosis Difficulty walking;Abnormality of gait;Generalized weakness;Acute pain   PT Problem List Decreased strength;Decreased range of motion;Decreased activity tolerance;Decreased balance;Decreased mobility;Pain;Decreased knowledge of use of DME  PT Treatment Interventions  DME instruction;Gait training;Functional mobility training;Therapeutic activities;Therapeutic exercise;Patient/family education;Balance training   PT Goals (Current goals can be found in the Care Plan section) Acute Rehab PT Goals Patient Stated Goal: home soon PT Goal Formulation: With patient Time For Goal Achievement: 08/02/14 Potential to Achieve Goals: Fair    Frequency Min 3X/week   Barriers to discharge        Co-evaluation               End of Session   Activity Tolerance: Patient limited by pain Patient left: in chair;with call bell/phone within reach;with chair alarm set           Time: 2248-2500 PT Time Calculation (min) (ACUTE ONLY): 24 min   Charges:   PT Evaluation $Initial PT Evaluation Tier I: 1 Procedure PT Treatments $Therapeutic Activity: 8-22 mins   PT G Codes:        Weston Anna, MPT Pager: [phone]

## 2014-07-20 DIAGNOSIS — E871 Hypo-osmolality and hyponatremia: Secondary | ICD-10-CM

## 2014-07-20 LAB — CBC
HCT: 32.3 % — ABNORMAL LOW (ref 36.0–46.0)
HEMOGLOBIN: 10.6 g/dL — AB (ref 12.0–15.0)
MCH: 32.9 pg (ref 26.0–34.0)
MCHC: 32.8 g/dL (ref 30.0–36.0)
MCV: 100.3 fL — ABNORMAL HIGH (ref 78.0–100.0)
Platelets: 369 10*3/uL (ref 150–400)
RBC: 3.22 MIL/uL — ABNORMAL LOW (ref 3.87–5.11)
RDW: 13.2 % (ref 11.5–15.5)
WBC: 9.4 10*3/uL (ref 4.0–10.5)

## 2014-07-20 LAB — BASIC METABOLIC PANEL
Anion gap: 9 (ref 5–15)
BUN: 19 mg/dL (ref 6–20)
CO2: 32 mmol/L (ref 22–32)
CREATININE: 0.95 mg/dL (ref 0.44–1.00)
Calcium: 9.4 mg/dL (ref 8.9–10.3)
Chloride: 92 mmol/L — ABNORMAL LOW (ref 101–111)
GFR calc Af Amer: >60 mL/min (ref >60)
GFR, EST NON AFRICAN AMERICAN: 55 mL/min — AB (ref >60)
GLUCOSE: 100 mg/dL — AB (ref 70–99)
POTASSIUM: 3.7 mmol/L (ref 3.5–5.1)
SODIUM: 133 mmol/L — AB (ref 135–145)

## 2014-07-20 LAB — LEGIONELLA ANTIGEN, URINE

## 2014-07-20 MED ORDER — LEVOFLOXACIN 750 MG PO TABS: 750.0000 mg | ORAL_TABLET | ORAL | Status: AC

## 2014-07-20 NOTE — Discharge Summary (Signed)
Physician Discharge Summary  Monique Russo VEH:[id number] DOB: [birth year] DOA: 07/17/2014  PCP: Tommy Medal, MD  Admit date: 07/17/2014 Discharge date: 07/20/2014  Time spent: 35 minutes  Recommendations for Outpatient Follow-up:  1. Discharge home with outpatient PCP follow-up in 1 week. 2. Please monitor LFTs as outpatient. Patient has iron deficiency anemia and recommend to start her on iron supplements as outpatient  Discharge Diagnoses:  Principal Problem:   Acute respiratory failure with hypoxia  Active Problems:   Acute on chronic combined systolic and diastolic CHF (congestive heart failure)   Lobar pneumonia   Leukocytosis   Hyponatremia   Acute renal failure   Elevated LFTs   Chronic pain syndrome   Iron deficiency anemia   Discharge Condition: Fair  Diet recommendation: Heart healthy  Filed Weights   07/17/14 2053 07/19/14 0444 07/20/14 0446  Weight: 86.365 kg (190 lb 6.4 oz) 85.412 kg (188 lb 4.8 oz) 84.913 kg (187 lb 3.2 oz)    History of present illness:  Please refer to admission H&P for details, in brief, 81 year old female with history of mine chronic systolic and diastolic CHF, depression, dyslipidemia who presented to [facility] ED for shortness of breath of one day duration. O2 sat per EMS was in the 70s. In the ED her O2 sat was at 79% on room air which improved to 90% with 4 L via nasal cannula. Chest x-ray was concerning for pulmonary edema and she was started on Levaquin for possible pneumonia given leukocytosis and low-grade fever. Patient was also hyponatremic with sodium of 128 and mild elevation of her liver enzymes.  Hospital Course:  Acute respiratory failure with hypoxia secondary to acute on chronic, systolic and diastolic CHF and lobar pneumonia Patient received IV Lasix with good improvement in symptoms. 2-D echo was repeated which showed EF of 35% with no wall motion abnormality. Further Lasix was held as blood pressure was low normal.  Patient has diuresed well and is currently euvolemic. Patient's home dose Lasix resumed upon discharge.  Lobar pneumonia  Patient treated empirically with Levaquin given leukocytosis and low-grade fever with hypoxia. Leukocytosis now resolved and patient clinically stable. Will discharge her on oral Levaquin to complete antibiotic course on 5/4. Blood cultures was negative.  Hyponatremia Likely secondary to pneumonia and has improved.   Acute kidney injury Possibly secondary to IV Lasix   received during hospitalization. Now improved.  Transaminitis Mild and improved. Ultrasound abdomen showed possible small noncalcified gallstones versus foci of sludge within the gallbladder but without acute cholecystitis. Follow-up LFTs as outpatient  Iron deficiency anemia Recommend starting iron supplements as outpatient  Depression Continue Cymbalta.  Patient clinically stable. Stable for discharge home with home health.  Diet: Heart healthy  CODE STATUS: Full code   Procedures:  2-D echo  Consultations:  None  Discharge Exam: Filed Vitals:   07/20/14 0446  BP: 138/68  Pulse: 81  Temp: 98.6 F (37 C)  Resp: 20    General: Elderly female in no acute distress HEENT: No pallor, moist oral mucosa, supple neck, no JVD Chest: Ear to auscultate bilaterally, no added sounds CVS: Normal S1 and S2, no murmurs rub or gallop GI: Soft, nondistended, nontender, bowel sounds present Musculoskeletal: Warm, no edema CNS: Alert and oriented   Discharge Instructions    Current Discharge Medication List    START taking these medications   Details  levofloxacin (LEVAQUIN) 750 MG tablet Take 1 tablet (750 mg total) by mouth every other day. Qty: 2 tablet, Refills: 0  CONTINUE these medications which have NOT CHANGED   Details  acetaminophen (TYLENOL) 325 MG tablet Take 325 mg by mouth every 6 (six) hours as needed for mild pain or headache.    Ascorbic Acid (VITAMIN C) 1000  MG tablet Take 1,000 mg by mouth daily.    b complex vitamins tablet Take 1 tablet by mouth daily as needed.     bisacodyl (DULCOLAX) 10 MG suppository Place 1 suppository (10 mg total) rectally daily as needed for moderate constipation (May repeat times one). Qty: 12 suppository, Refills: 0    budesonide (RHINOCORT AQUA) 32 MCG/ACT nasal spray Place 1 spray into both nostrils daily as needed for rhinitis.    cholecalciferol (VITAMIN D) 1000 UNITS tablet Take 1,000 Units by mouth daily.    diclofenac (VOLTAREN) 50 MG EC tablet Take 50 mg by mouth 2 (two) times daily.     diclofenac sodium (VOLTAREN) 1 % GEL Apply topically every three (3) days as needed (For Leg pain). 1 gm    docusate sodium (COLACE) 100 MG capsule Take 1 capsule (100 mg total) by mouth 3 (three) times daily as needed. Qty: 20 capsule, Refills: 0    DULoxetine (CYMBALTA) 60 MG capsule Take 60 mg by mouth daily. At bedtime    furosemide (LASIX) 80 MG tablet Take 80 mg by mouth daily. Prescribed as needed    gabapentin (NEURONTIN) 400 MG capsule Take 400 mg by mouth 2 (two) times daily.     magnesium hydroxide (MILK OF MAGNESIA) 400 MG/5ML suspension Take 30 mLs by mouth daily as needed for mild constipation.    methadone (DOLOPHINE) 5 MG tablet Take 5 mg by mouth every 8 (eight) hours.     methocarbamol (ROBAXIN) 750 MG tablet Take 750 mg by mouth every 8 (eight) hours as needed (Prescribed TID but patient takes PRN).     Omega-3 Fatty Acids (FISH OIL) 1200 MG CAPS Take 2 capsules by mouth daily.    oxyCODONE-acetaminophen (PERCOCET) 10-325 MG per tablet Take 1 tablet by mouth every 8 (eight) hours as needed for pain.    potassium chloride SA (K-DUR,KLOR-CON) 20 MEQ tablet Take 20 mEq by mouth daily as needed (Only takes with lasix).    temazepam (RESTORIL) 30 MG capsule Take 1 capsule (30 mg total) by mouth at bedtime as needed for sleep. Qty: 30 capsule, Refills: 0    timolol (BETIMOL) 0.5 % ophthalmic  solution Place 1 drop into both eyes daily.     vitamin E 400 UNIT capsule Take 400 Units by mouth daily.       STOP taking these medications     oxyCODONE-acetaminophen (ROXICET) 5-325 MG per tablet        No Known Allergies Follow-up Information    Follow up with PANG,RICHARD, MD. Schedule an appointment as soon as possible for a visit in 1 week.   Specialty:  Internal Medicine   Contact information:   [address], [place] [address] [phone]        The results of significant diagnostics from this hospitalization (including imaging, microbiology, ancillary and laboratory) are listed below for reference.    Significant Diagnostic Studies: Dg Chest 2 View  07/18/2014   CLINICAL DATA:  Shortness of breath and cough since yesterday. History of CHF,.  EXAM: CHEST  2 VIEW  COMPARISON:  07/17/2014; 11/26/2013; 07/04/2013 ; 04/13/2010  FINDINGS: Grossly unchanged cardiac silhouette and mediastinal contours with persistent partial obscuration of the right heart border secondary to air fluid  containing structure compatible with a hiatal hernia. Improved aeration of the lungs without new focal airspace opacity. No pleural effusion or pneumothorax. There is persistent elevation / eventration of the anterior lateral aspect of the right hemidiaphragm. Unchanged bones including post bilateral total shoulder replacement, incompletely evaluated. Moderate scoliotic curvature of the thoracolumbar spine.  IMPRESSION: 1. Improved aeration of lungs suggestive of improved pulmonary edema. No new focal airspace opacities. 2. Similar findings of suspected large hiatal hernia.   Electronically Signed   By: Sandi Mariscal M.D.   On: 07/18/2014 11:17   US Abdomen Complete  07/18/2014   CLINICAL DATA:  Elevated LFTs.  EXAM: ULTRASOUND ABDOMEN COMPLETE  COMPARISON:  None.  FINDINGS: Gallbladder: Gallbladder wall is normal in thickness, 1.0 mm. No sonographic Murphy sign or pericholecystic  fluid. Small echogenic mobile areas within the gallbladder are not associated with shadowing. Largest measures approximately 4 mm. These may represent noncalcified gallstones or sludge balls.  Common bile duct: Diameter: 1.0 cm.  Liver: Mildly echogenic without focal lesion.  IVC: Lower inferior vena cava is difficult to image. Visualized portion is unremarkable.  Pancreas: Not well seen because of overlying bowel gas.  Spleen: Size and appearance within normal limits.  Right Kidney: Length: 10.9 cm. Echogenicity within normal limits. No mass or hydronephrosis visualized.  Left Kidney: Length: 10.9 cm. Although no abnormalities are identified, images are limited because of overlying bowel gas.  Abdominal aorta: Calcified but not aneurysmal, 2.3 cm.  Other findings: None.  IMPRESSION: 1. Possible small noncalcified gallstones versus foci of sludge within the gallbladder. No other evidence for acute cholecystitis. 2. Mild prominence of the common bile duct without visible choledocholithiasis. 3. Mildly echogenic liver. 4. No hydronephrosis.   Electronically Signed   By: Nolon Nations M.D.   On: 07/18/2014 12:05   Dg Chest Port 1 View  07/17/2014   CLINICAL DATA:  Shortness of Breath beginning today. History of CHF, pneumonia.  EXAM: PORTABLE CHEST - 1 VIEW  COMPARISON:  11/26/2013  FINDINGS: Large hiatal hernia. Heart is borderline in size. Diffuse interstitial prominence with patchy bilateral alveolar opacities. Suspect edema/CHF. No effusions. No acute bony abnormality. Bilateral shoulder replacements noted.  IMPRESSION: Diffuse bilateral interstitial and alveolar opacities with borderline heart size. Suspect edema/ CHF.  Large hiatal hernia.   Electronically Signed   By: Rolm Baptise M.D.   On: 07/17/2014 15:53    Microbiology: Recent Results (from the past 240 hour(s))  Blood culture (routine x 2)     Status: None (Preliminary result)   Collection Time: 07/17/14  6:22 PM  Result Value Ref Range Status    Specimen Description BLOOD RIGHT HAND  Final   Special Requests BOTTLES DRAWN AEROBIC ONLY 5ML  Final   Culture   Final           BLOOD CULTURE RECEIVED NO GROWTH TO DATE CULTURE WILL BE HELD FOR 5 DAYS BEFORE ISSUING A FINAL NEGATIVE REPORT Performed at [facility]    Report Status PENDING  Incomplete  Blood culture (routine x 2)     Status: None (Preliminary result)   Collection Time: 07/17/14  6:23 PM  Result Value Ref Range Status   Specimen Description BLOOD RIGHT ARM  Final   Special Requests BOTTLES DRAWN AEROBIC AND ANAEROBIC 5CC  Final   Culture   Final           BLOOD CULTURE RECEIVED NO GROWTH TO DATE CULTURE WILL BE HELD FOR 5 DAYS BEFORE ISSUING A FINAL  NEGATIVE REPORT Performed at [facility]    Report Status PENDING  Incomplete  Culture, Urine     Status: None   Collection Time: 07/17/14 10:46 PM  Result Value Ref Range Status   Specimen Description URINE, CATHETERIZED  Final   Special Requests NONE  Final   Colony Count NO GROWTH Performed at [facility]   Final   Culture NO GROWTH Performed at [facility]   Final   Report Status 07/18/2014 FINAL  Final  Culture, sputum-assessment     Status: None   Collection Time: 07/19/14  2:41 AM  Result Value Ref Range Status   Specimen Description SPUTUM  Final   Special Requests NONE  Final   Sputum evaluation   Final    THIS SPECIMEN IS ACCEPTABLE. RESPIRATORY CULTURE REPORT TO FOLLOW.   Report Status 07/19/2014 FINAL  Final     Labs: Basic Metabolic Panel:  Recent Labs Lab 07/17/14 1700 07/17/14 1830 07/17/14 2115 07/18/14 0649 07/19/14 0501 07/20/14 0519  NA 128* 126*  --  130* 132* 133*  K 4.8 4.9  --  3.9 3.7 3.7  CL 88* 88*  --  88* 92* 92*  CO2 31  --   --  33* 31 32  GLUCOSE 125* 120*  --  112* 108* 100*  BUN 30* 39*  --  29* 22* 19  CREATININE 1.24* 1.30* 1.18* 1.19* 1.01* 0.95  CALCIUM 9.8  --   --  9.2 9.4 9.4  MG  --   --   --  2.5  --   --   PHOS  --    --   --  3.7  --   --    Liver Function Tests:  Recent Labs Lab 07/17/14 1700 07/18/14 0649  AST 103* 56*  ALT 95* 70*  ALKPHOS 134* 123*  BILITOT 0.6 0.6  PROT 7.0 6.8  ALBUMIN 3.2* 2.9*   No results for input(s): LIPASE, AMYLASE in the last 168 hours. No results for input(s): AMMONIA in the last 168 hours. CBC:  Recent Labs Lab 07/17/14 1700 07/17/14 1830 07/17/14 2115 07/18/14 0649 07/20/14 0519  WBC 20.8*  --  20.9* 17.5* 9.4  NEUTROABS 18.8*  --  18.7* 14.7*  --   HGB 10.6* 12.9 10.8* 9.8* 10.6*  HCT 32.1* 38.0 31.9* 30.0* 32.3*  MCV 100.3*  --  100.0 100.0 100.3*  PLT 335  --  319 311 369   Cardiac Enzymes:  Recent Labs Lab 07/17/14 2115 07/18/14 0649 07/18/14 1315  TROPONINI <0.03 <0.03 <0.03   BNP: BNP (last 3 results)  Recent Labs  07/17/14 1700  BNP 159.5*    ProBNP (last 3 results) No results for input(s): PROBNP in the last 8760 hours.  CBG:  Recent Labs Lab 07/18/14 1256  GLUCAP 132*       Signed:  Dezeray Puccio  [facility] 07/20/2014, 8:56 AM

## 2014-07-20 NOTE — Discharge Instructions (Signed)

## 2014-07-20 NOTE — Progress Notes (Signed)
D/C instructions reviewed w/ pt and spouse. Both verbalize understanding, all questions answered.  Pt d/c in w/c in stable condition by NT to spouse's car.  Pt in possession of d/c instructions, script, and all personal belongings.

## 2014-07-20 NOTE — Progress Notes (Signed)
Occupational Therapy Treatment Patient Details Name: Monique Russo MRN: [id number] DOB: 1931/02/09 Today's Date: 07/20/2014    History of present illness 81 yo female admitted with acute resp failure. hx of CHF, arthritis, chronic back pain, R shoulder surgery   OT comments  Pt ready to DC  Follow Up Recommendations  Supervision/Assistance - 24 hour;Home health OT    Equipment Recommendations  None recommended by OT    Recommendations for Other Services      Precautions / Restrictions Precautions Precautions: Fall Restrictions Weight Bearing Restrictions: No       Mobility Bed Mobility               General bed mobility comments: pt in chair  Transfers Overall transfer level: Needs assistance   Transfers: Sit to/from Stand Sit to Stand: Min guard Stand pivot transfers: Min guard       General transfer comment: VC for safety        ADL Overall ADL's : Needs assistance/impaired     Grooming: Minimal assistance;Sitting           Upper Body Dressing : Minimal assistance;Sitting   Lower Body Dressing: Minimal assistance;Sit to/from stand   Toilet Transfer: Min guard;Ambulation   Toileting- Clothing Manipulation and Hygiene: Min guard;Sit to/from stand         General ADL Comments: husband helps with all ADL activity . Pt does not desire to be I - she wants husband to A.  Explained her trying to don clothes good for muscles and endurance.  Pt verbalized understanding                Cognition   Behavior During Therapy: WFL for tasks assessed/performed Overall Cognitive Status: Within Functional Limits for tasks assessed (decreased safety)                               General Comments Pt very dependent on husband    Pertinent Vitals/ Pain       Faces Pain Scale: Hurts little more Pain Location: r knee Pain Descriptors / Indicators: Sore Pain Intervention(s): Limited activity within patient's tolerance;Monitored during  session;Repositioned         Frequency       Progress Toward Goals  OT Goals(current goals can now be found in the care plan section)  Progress towards OT goals: Progressing toward goals     Plan Discharge plan remains appropriate       End of Session     Activity Tolerance Patient tolerated treatment well   Patient Left in chair;with family/visitor present   Nurse Communication          Time: 1017-5102 OT Time Calculation (min): 24 min  Charges: OT General Charges $OT Visit: 1 Procedure OT Treatments $Self Care/Home Management : 23-37 mins  Rajeev Escue D 07/20/2014, 10:03 AM

## 2014-07-20 NOTE — Progress Notes (Signed)
Physical Therapy Treatment Patient Details Name: Monique Russo MRN: [id number] DOB: [birth year] Today's Date: Jul 24, 2014    History of Present Illness 81 yo female admitted with acute resp failure. hx of CHF, arthritis, chronic back pain, R shoulder surgery    PT Comments    Pt ambulated short distance in hallway.  Pt is likely close to her baseline as she reports only ambulating when necessary at home due to chronic back and R knee pain.  Spouse reports he can assist her into home with w/c or transport chair if necessary.   Follow Up Recommendations  Home health PT;Supervision/Assistance - 24 hour     Equipment Recommendations  None recommended by PT    Recommendations for Other Services       Precautions / Restrictions Precautions Precautions: Fall    Mobility  Bed Mobility               General bed mobility comments: pt in chair  Transfers Overall transfer level: Needs assistance Equipment used: Rolling walker (2 wheeled) Transfers: Sit to/from Stand Sit to Stand: Min guard Stand pivot transfers: Min guard       General transfer comment: verbal cues for safety with RW  Ambulation/Gait Ambulation/Gait assistance: Min guard Ambulation Distance (Feet): 40 Feet Assistive device: Rolling walker (2 wheeled) Gait Pattern/deviations: Step-to pattern;Antalgic;Trunk flexed     General Gait Details: pt reports distance limited by chronic back and R knee pain (states her knee needs another injection), verbal cues for safe use of RW   Stairs            Wheelchair Mobility    Modified Rankin (Stroke Patients Only)       Balance                                    Cognition Arousal/Alertness: Awake/alert Behavior During Therapy: WFL for tasks assessed/performed Overall Cognitive Status: Within Functional Limits for tasks assessed                      Exercises      General Comments        Pertinent Vitals/Pain Pain  Assessment: Faces Faces Pain Scale: Hurts little more Pain Location: R knee Pain Descriptors / Indicators: Sore Pain Intervention(s): Limited activity within patient's tolerance;Monitored during session    Home Living                      Prior Function            PT Goals (current goals can now be found in the care plan section) Progress towards PT goals: Progressing toward goals    Frequency  Min 3X/week    PT Plan Current plan remains appropriate    Co-evaluation             End of Session Equipment Utilized During Treatment: Gait belt Activity Tolerance: Patient limited by pain Patient left: in chair;with call bell/phone within reach;with chair alarm set;with family/visitor present     Time: 9485-4627 PT Time Calculation (min) (ACUTE ONLY): 14 min  Charges:  $Gait Training: 8-22 mins                    G Codes:      Jayvian Escoe,KATHrine E 07-24-14, 12:59 PM Monique Russo, PT, DPT 2014/07/24 Pager: [phone]

## 2014-07-21 LAB — CULTURE, RESPIRATORY

## 2014-07-21 LAB — CULTURE, RESPIRATORY W GRAM STAIN: Culture: NORMAL

## 2014-07-24 LAB — CULTURE, BLOOD (ROUTINE X 2)
Culture: NO GROWTH
Culture: NO GROWTH

## 2014-07-30 ENCOUNTER — Telehealth: Payer: Self-pay | Admitting: Cardiovascular Disease

## 2014-07-30 NOTE — Telephone Encounter (Signed)
Received records from [facility] for appointment on 09/09/14 with Dr Sallyanne Kuster.  Records given to [facility]  (medical records) for Dr Croitoru's schedule on 09/09/14. lp

## 2014-08-02 ENCOUNTER — Encounter (HOSPITAL_COMMUNITY): Payer: Self-pay

## 2014-08-02 ENCOUNTER — Emergency Department (HOSPITAL_COMMUNITY): Payer: PPO

## 2014-08-02 ENCOUNTER — Emergency Department (HOSPITAL_COMMUNITY)
Admission: EM | Admit: 2014-08-02 | Discharge: 2014-08-03 | Disposition: A | Discharge status: Home / Self Care | Payer: PPO | Source: Home / Self Care | Attending: Emergency Medicine | Admitting: Emergency Medicine

## 2014-08-02 DIAGNOSIS — Z8719 Personal history of other diseases of the digestive system: Secondary | ICD-10-CM | POA: Insufficient documentation

## 2014-08-02 DIAGNOSIS — K255 Chronic or unspecified gastric ulcer with perforation: Secondary | ICD-10-CM | POA: Diagnosis not present

## 2014-08-02 DIAGNOSIS — I509 Heart failure, unspecified: Secondary | ICD-10-CM | POA: Insufficient documentation

## 2014-08-02 DIAGNOSIS — Z8659 Personal history of other mental and behavioral disorders: Secondary | ICD-10-CM | POA: Insufficient documentation

## 2014-08-02 DIAGNOSIS — Z791 Long term (current) use of non-steroidal anti-inflammatories (NSAID): Secondary | ICD-10-CM

## 2014-08-02 DIAGNOSIS — M1990 Unspecified osteoarthritis, unspecified site: Secondary | ICD-10-CM | POA: Insufficient documentation

## 2014-08-02 DIAGNOSIS — K8020 Calculus of gallbladder without cholecystitis without obstruction: Secondary | ICD-10-CM

## 2014-08-02 DIAGNOSIS — Z8701 Personal history of pneumonia (recurrent): Secondary | ICD-10-CM

## 2014-08-02 DIAGNOSIS — R1011 Right upper quadrant pain: Secondary | ICD-10-CM | POA: Diagnosis not present

## 2014-08-02 DIAGNOSIS — R0602 Shortness of breath: Secondary | ICD-10-CM | POA: Insufficient documentation

## 2014-08-02 DIAGNOSIS — R2240 Localized swelling, mass and lump, unspecified lower limb: Secondary | ICD-10-CM

## 2014-08-02 DIAGNOSIS — Z79899 Other long term (current) drug therapy: Secondary | ICD-10-CM

## 2014-08-02 DIAGNOSIS — K8050 Calculus of bile duct without cholangitis or cholecystitis without obstruction: Secondary | ICD-10-CM

## 2014-08-02 DIAGNOSIS — R52 Pain, unspecified: Secondary | ICD-10-CM

## 2014-08-02 LAB — CBC WITH DIFFERENTIAL/PLATELET
Basophils Absolute: 0 10*3/uL (ref 0.0–0.1)
Basophils Relative: 0 % (ref 0–1)
Eosinophils Absolute: 0.1 10*3/uL (ref 0.0–0.7)
Eosinophils Relative: 1 % (ref 0–5)
HCT: 39.2 % (ref 36.0–46.0)
Hemoglobin: 12.9 g/dL (ref 12.0–15.0)
Lymphocytes Relative: 25 % (ref 12–46)
Lymphs Abs: 2.5 10*3/uL (ref 0.7–4.0)
MCH: 32.6 pg (ref 26.0–34.0)
MCHC: 32.9 g/dL (ref 30.0–36.0)
MCV: 99 fL (ref 78.0–100.0)
Monocytes Absolute: 1.1 10*3/uL — ABNORMAL HIGH (ref 0.1–1.0)
Monocytes Relative: 11 % (ref 3–12)
NEUTROS ABS: 6.3 10*3/uL (ref 1.7–7.7)
NEUTROS PCT: 63 % (ref 43–77)
Platelets: 451 10*3/uL — ABNORMAL HIGH (ref 150–400)
RBC: 3.96 MIL/uL (ref 3.87–5.11)
RDW: 13.7 % (ref 11.5–15.5)
WBC: 10 10*3/uL (ref 4.0–10.5)

## 2014-08-02 LAB — I-STAT TROPONIN, ED: Troponin i, poc: 0 ng/mL (ref 0.00–0.08)

## 2014-08-02 LAB — COMPREHENSIVE METABOLIC PANEL
ALBUMIN: 3.3 g/dL — AB (ref 3.5–5.0)
ALK PHOS: 98 U/L (ref 38–126)
ALT: 28 U/L (ref 14–54)
AST: 40 U/L (ref 15–41)
Anion gap: 12 (ref 5–15)
BUN: 13 mg/dL (ref 6–20)
CALCIUM: 9.4 mg/dL (ref 8.9–10.3)
CO2: 25 mmol/L (ref 22–32)
CREATININE: 0.82 mg/dL (ref 0.44–1.00)
Chloride: 96 mmol/L — ABNORMAL LOW (ref 101–111)
GFR calc Af Amer: >60 mL/min (ref >60)
GFR calc non Af Amer: >60 mL/min (ref >60)
Glucose, Bld: 115 mg/dL — ABNORMAL HIGH (ref 65–99)
POTASSIUM: 4.1 mmol/L (ref 3.5–5.1)
Sodium: 133 mmol/L — ABNORMAL LOW (ref 135–145)
Total Bilirubin: 0.5 mg/dL (ref 0.3–1.2)
Total Protein: 7.8 g/dL (ref 6.5–8.1)

## 2014-08-02 LAB — LIPASE, BLOOD: Lipase: 17 U/L — ABNORMAL LOW (ref 22–51)

## 2014-08-02 MED ORDER — FENTANYL CITRATE (PF) 100 MCG/2ML IJ SOLN
25.0000 ug | Freq: Once | INTRAMUSCULAR | Status: AC
  Administered 2014-08-03: 25 ug via INTRAVENOUS
  Filled 2014-08-02: qty 2

## 2014-08-02 NOTE — ED Provider Notes (Signed)
CSN: [id number]     Arrival date & time 08/02/14  2105 History   First MD Initiated Contact with Patient 08/02/14 2242     Chief Complaint  Patient presents with  . Shortness of Breath  . Abdominal Pain     (Consider location/radiation/quality/duration/timing/severity/associated sxs/prior Treatment) HPI   Pt with hx GERD, recent pneumonia, CHF, gallstones vs sludge p/w RUQ and epigastric pain, aching, constant, with radiation through to the back.  Associated nausea.  Began last night, worsened today.  Pain is worse with sitting up and better with lying flat.  Has recent admission for respiratory failure (4/29-5/2), has overall improved since then but husband notes she looked more SOB today.  Pt had an episode of getting hot/sweaty today.  Denies fevers, urinary symptoms, change in chronic constipation (last BM yesterday).  States she has spent all day in bed due to the pain.  Does note that she is very hungry.  Past abdominal surgeries: 2 hernia, 2 surgeries for "locked bowel," hysterectomy due to bleeding, appendectomy.    Past Medical History  Diagnosis Date  . Arthritis   . CHF (congestive heart failure)   . Pneumonia   . Depression   . GERD (gastroesophageal reflux disease)    Past Surgical History  Procedure Laterality Date  . Abdominal hysterectomy    . Joint replacement Left     shoulder  . Reverse shoulder arthroplasty Right 12/04/2013  . Reverse shoulder arthroplasty  12/04/2013    Procedure: REVERSE SHOULDER ARTHROPLASTY;  Surgeon: Nita Sells, MD;  Location: [facility] OR;  Service: Orthopedics;;  Right reverse total shoulder arthroplasty  . Back surgery  2000  . Colon surgery      x2-blockage  . Hernia repair  2012  . Orif wrist fracture  2012    left  . Ercp    . Colonoscopy    . Rotator cuff repair      dr Tamera Punt  . Shoulder arthroscopy  2012    lt and rt  . Tendon repair Right 04/13/2014    Procedure: RIGHT HAND CENTRAL TENDON CENTRALIZATION OF LONG AND  RING FINGERS;  Surgeon: Jolyn Nap, MD;  Location: [facility];  Service: Orthopedics;  Laterality: Right;   Family History  Problem Relation Age of Onset  . Stroke Mother   . Breast cancer Sister   . Ulcerative colitis Daughter   . Chronic fatigue Daughter   . Chronic fatigue Daughter    History  Substance Use Topics  . Smoking status: Never Smoker   . Smokeless tobacco: Never Used  . Alcohol Use: 4.2 oz/week    7 Glasses of wine per week     Comment: 8 oz -12 oz of wine every    OB History    No data available     Review of Systems  Cardiovascular: Positive for leg swelling (unchanged, chronic).  All other systems reviewed and are negative.     Allergies  Review of patient's allergies indicates no known allergies.  Home Medications   Prior to Admission medications   Medication Sig Start Date End Date Taking? Authorizing Provider  acetaminophen (TYLENOL) 325 MG tablet Take 325 mg by mouth every 6 (six) hours as needed for mild pain or headache. 07/10/13   Melton Alar, PA-C  Ascorbic Acid (VITAMIN C) 1000 MG tablet Take 1,000 mg by mouth daily.    Historical Provider, MD  b complex vitamins tablet Take 1 tablet by mouth daily as needed.  Historical Provider, MD  bisacodyl (DULCOLAX) 10 MG suppository Place 1 suppository (10 mg total) rectally daily as needed for moderate constipation (May repeat times one). 07/10/13   Bobby Rumpf [place], PA-C  budesonide (RHINOCORT AQUA) 32 MCG/ACT nasal spray Place 1 spray into both nostrils daily as needed for rhinitis.    Historical Provider, MD  cholecalciferol (VITAMIN D) 1000 UNITS tablet Take 1,000 Units by mouth daily.    Historical Provider, MD  diclofenac (VOLTAREN) 50 MG EC tablet Take 50 mg by mouth 2 (two) times daily.     Historical Provider, MD  diclofenac sodium (VOLTAREN) 1 % GEL Apply topically every three (3) days as needed (For Leg pain). 1 gm    Historical Provider, MD  docusate sodium (COLACE)  100 MG capsule Take 1 capsule (100 mg total) by mouth 3 (three) times daily as needed. 12/08/13   Grier Mitts, PA-C  DULoxetine (CYMBALTA) 60 MG capsule Take 60 mg by mouth daily. At bedtime    Historical Provider, MD  furosemide (LASIX) 80 MG tablet Take 80 mg by mouth daily. Prescribed as needed    Historical Provider, MD  gabapentin (NEURONTIN) 400 MG capsule Take 400 mg by mouth 2 (two) times daily.     Historical Provider, MD  magnesium hydroxide (MILK OF MAGNESIA) 400 MG/5ML suspension Take 30 mLs by mouth daily as needed for mild constipation.    Historical Provider, MD  methadone (DOLOPHINE) 5 MG tablet Take 5 mg by mouth every 8 (eight) hours.     Historical Provider, MD  methocarbamol (ROBAXIN) 750 MG tablet Take 750 mg by mouth every 8 (eight) hours as needed (Prescribed TID but patient takes PRN).     Historical Provider, MD  Omega-3 Fatty Acids (FISH OIL) 1200 MG CAPS Take 2 capsules by mouth daily.    Historical Provider, MD  oxyCODONE-acetaminophen (PERCOCET) 10-325 MG per tablet Take 1 tablet by mouth every 8 (eight) hours as needed for pain.    Historical Provider, MD  potassium chloride SA (K-DUR,KLOR-CON) 20 MEQ tablet Take 20 mEq by mouth daily as needed (Only takes with lasix).    Historical Provider, MD  temazepam (RESTORIL) 30 MG capsule Take 1 capsule (30 mg total) by mouth at bedtime as needed for sleep. 07/10/13   Bobby Rumpf York, PA-C  timolol (BETIMOL) 0.5 % ophthalmic solution Place 1 drop into both eyes daily.     Historical Provider, MD  vitamin E 400 UNIT capsule Take 400 Units by mouth daily.     Historical Provider, MD   BP 146/64 mmHg  Pulse 76  Temp(Src) 98.2 F (36.8 C) (Oral)  Resp 18  Ht 5' (1.524 m)  Wt 185 lb (83.915 kg)  BMI 36.13 kg/m2  SpO2 99% Physical Exam  Constitutional: She appears well-developed and well-nourished. No distress.  HENT:  Head: Normocephalic and atraumatic.  Neck: Neck supple.  Cardiovascular: Normal rate and regular  rhythm.   Pulmonary/Chest: Effort normal and breath sounds normal. No respiratory distress. She has no wheezes. She has no rales.  Abdominal: Soft. Bowel sounds are normal. She exhibits no distension. There is tenderness in the right upper quadrant. There is no rebound and no guarding.  Neurological: She is alert.  Skin: She is not diaphoretic.  Nursing note and vitals reviewed.   ED Course  Procedures (including critical care time) Labs Review Labs Reviewed  CBC WITH DIFFERENTIAL/PLATELET - Abnormal; Notable for the following:    Platelets 451 (*)    Monocytes Absolute 1.1 (*)  All other components within normal limits  COMPREHENSIVE METABOLIC PANEL - Abnormal; Notable for the following:    Sodium 133 (*)    Chloride 96 (*)    Glucose, Bld 115 (*)    Albumin 3.3 (*)    All other components within normal limits  LIPASE, BLOOD - Abnormal; Notable for the following:    Lipase 17 (*)    All other components within normal limits  I-STAT TROPOININ, ED    Imaging Review Dg Chest 2 View  08/02/2014   CLINICAL DATA:  Shortness of breath and abdominal pain today.  EXAM: CHEST  2 VIEW  COMPARISON:  07/18/2014  FINDINGS: Lungs are adequately inflated without consolidation or effusion. Mild linear atelectasis in the left perihilar region. Cardiomediastinal silhouette is within normal. There is a large hiatal hernia unchanged. There is mild calcified plaque over the aortic arch. Remainder the exam is unchanged.  IMPRESSION: No acute cardiopulmonary disease.   Electronically Signed   By: Marin Olp M.D.   On: 08/02/2014 21:59     EKG Interpretation   Date/Time:  "Sunday Aug 02 2014 21:13:04 EDT Ventricular Rate:  94 PR Interval:  134 QRS Duration: 88 QT Interval:  358 QTC Calculation: 447 R Axis:   13 Text Interpretation:  Normal sinus rhythm Cannot rule out Anterior infarct  , age undetermined Abnormal ECG No significant change since last tracing  Confirmed by HORTON  MD, COURTNEY  ([id number]) on 08/03/2014 12:24:08 AM       Patient's husband: Home [phone] Cell [phone]  12:08 AM Discussed pt with Dr Horton who will also see and examine patient.     12" :59 AM Pt reports pain is improved except when I examine her.  Persistent tenderness to palpation in RUQ, no guarding or rebound.     MDM   Final diagnoses:  Pain  RUQ abdominal pain    Afebrile nontoxic patient with known gallstones vs sludge with RUQ pain that began last night.  Labs at patient's baseline.  CXR negative.  EKG unchanged. RUQ abd [place] pending at end of shift.  Pt notes she is hungry.  If [place] reading is not concerning for anything requiring acute intervention, anticipate pain control, PO trial, outpatient surgical follow up.  Discussed pt with Dr Dina Rich at end of shift who assumes care of patient.     [place], PA-C 08/03/14 [id number]  Merryl Hacker, MD 08/03/14 0330

## 2014-08-02 NOTE — ED Notes (Signed)
Husband contact info:  Cell: [phone] Home: [phone]

## 2014-08-02 NOTE — ED Notes (Signed)
Pt reports ruq pain, sts she thinks it is her gall bladder, seen in hosptial 2 weeks ago and told she had gall stones.

## 2014-08-03 ENCOUNTER — Emergency Department (HOSPITAL_COMMUNITY): Payer: PPO

## 2014-08-03 MED ORDER — FENTANYL CITRATE (PF) 100 MCG/2ML IJ SOLN: 50.0000 ug | Freq: Once | INTRAMUSCULAR | Status: DC

## 2014-08-03 MED ORDER — OXYCODONE-ACETAMINOPHEN 5-325 MG PO TABS: 1.0000 | ORAL_TABLET | Freq: Four times a day (QID) | ORAL | Status: DC | PRN

## 2014-08-03 NOTE — ED Notes (Signed)
Patient transported to Ultrasound 

## 2014-08-03 NOTE — ED Notes (Signed)
Pt tolerated PO fluids/food well.

## 2014-08-03 NOTE — Discharge Instructions (Signed)
Read the information below.  You may return to the Emergency Department at any time for worsening condition or any new symptoms that concern you.  If you develop high fevers, worsening abdominal pain, uncontrolled vomiting, or are unable to tolerate fluids by mouth, return to the ER for a recheck.    You need to call the general surgery office to set up an appointment for evaluation for gallbladder removal.  Biliary Colic  Biliary colic is a steady or irregular pain in the upper abdomen. It is usually under the right side of the rib cage. It happens when gallstones interfere with the normal flow of bile from the gallbladder. Bile is a liquid that helps to digest fats. Bile is made in the liver and stored in the gallbladder. When you eat a meal, bile passes from the gallbladder through the cystic duct and the common bile duct into the small intestine. There, it mixes with partially digested food. If a gallstone blocks either of these ducts, the normal flow of bile is blocked. The muscle cells in the bile duct contract forcefully to try to move the stone. This causes the pain of biliary colic.  SYMPTOMS   A person with biliary colic usually complains of pain in the upper abdomen. This pain can be:  In the center of the upper abdomen just below the breastbone.  In the upper-right part of the abdomen, near the gallbladder and liver.  Spread back toward the right shoulder blade.  Nausea and vomiting.  The pain usually occurs after eating.  Biliary colic is usually triggered by the digestive system's demand for bile. The demand for bile is high after fatty meals. Symptoms can also occur when a person who has been fasting suddenly eats a very large meal. Most episodes of biliary colic pass after 1 to 5 hours. After the most intense pain passes, your abdomen may continue to ache mildly for about 24 hours. DIAGNOSIS  After you describe your symptoms, your caregiver will perform a physical exam. He or  she will pay attention to the upper right portion of your belly (abdomen). This is the area of your liver and gallbladder. An ultrasound will help your caregiver look for gallstones. Specialized scans of the gallbladder may also be done. Blood tests may be done, especially if you have fever or if your pain persists. PREVENTION  Biliary colic can be prevented by controlling the risk factors for gallstones. Some of these risk factors, such as heredity, increasing age, and pregnancy are a normal part of life. Obesity and a high-fat diet are risk factors you can change through a healthy lifestyle. Women going through menopause who take hormone replacement therapy (estrogen) are also more likely to develop biliary colic. TREATMENT   Pain medication may be prescribed.  You may be encouraged to eat a fat-free diet.  If the first episode of biliary colic is severe, or episodes of colic keep retuning, surgery to remove the gallbladder (cholecystectomy) is usually recommended. This procedure can be done through small incisions using an instrument called a laparoscope. The procedure often requires a brief stay in the hospital. Some people can leave the hospital the same day. It is the most widely used treatment in people troubled by painful gallstones. It is effective and safe, with no complications in more than 90% of cases.  If surgery cannot be done, medication that dissolves gallstones may be used. This medication is expensive and can take months or years to work. Only small stones  will dissolve.  Rarely, medication to dissolve gallstones is combined with a procedure called shock-wave lithotripsy. This procedure uses carefully aimed shock waves to break up gallstones. In many people treated with this procedure, gallstones form again within a few years. PROGNOSIS  If gallstones block your cystic duct or common bile duct, you are at risk for repeated episodes of biliary colic. There is also a 25% chance that  you will develop a gallbladder infection(acute cholecystitis), or some other complication of gallstones within 10 to 20 years. If you have surgery, schedule it at a time that is convenient for you and at a time when you are not sick. HOME CARE INSTRUCTIONS   Drink plenty of clear fluids.  Avoid fatty, greasy or fried foods, or any foods that make your pain worse.  Take medications as directed. SEEK MEDICAL CARE IF:   You develop a fever over 100.5 F (38.1 C).  Your pain gets worse over time.  You develop nausea that prevents you from eating and drinking.  You develop vomiting. SEEK IMMEDIATE MEDICAL CARE IF:   You have continuous or severe belly (abdominal) pain which is not relieved with medications.  You develop nausea and vomiting which is not relieved with medications.  You have symptoms of biliary colic and you suddenly develop a fever and shaking chills. This may signal cholecystitis. Call your caregiver immediately.  You develop a yellow color to your skin or the white part of your eyes (jaundice). Document Released: 08/07/2005 Document Revised: 05/29/2011 Document Reviewed: 10/17/2007 [facility] Patient Information 2015 [place], [place]. This information is not intended to replace advice given to you by your health care provider. Make sure you discuss any questions you have with your health care provider.  Abdominal Pain Many things can cause abdominal pain. Usually, abdominal pain is not caused by a disease and will improve without treatment. It can often be observed and treated at home. Your health care provider will do a physical exam and possibly order blood tests and X-rays to help determine the seriousness of your pain. However, in many cases, more time must pass before a clear cause of the pain can be found. Before that point, your health care provider may not know if you need more testing or further treatment. HOME CARE INSTRUCTIONS  Monitor your abdominal pain for any  changes. The following actions may help to alleviate any discomfort you are experiencing:  Only take over-the-counter or prescription medicines as directed by your health care provider.  Do not take laxatives unless directed to do so by your health care provider.  Try a clear liquid diet (broth, tea, or water) as directed by your health care provider. Slowly move to a bland diet as tolerated. SEEK MEDICAL CARE IF:  You have unexplained abdominal pain.  You have abdominal pain associated with nausea or diarrhea.  You have pain when you urinate or have a bowel movement.  You experience abdominal pain that wakes you in the night.  You have abdominal pain that is worsened or improved by eating food.  You have abdominal pain that is worsened with eating fatty foods.  You have a fever. SEEK IMMEDIATE MEDICAL CARE IF:   Your pain does not go away within 2 hours.  You keep throwing up (vomiting).  Your pain is felt only in portions of the abdomen, such as the right side or the left lower portion of the abdomen.  You pass bloody or black tarry stools. MAKE SURE YOU:  Understand these  instructions.   Will watch your condition.   Will get help right away if you are not doing well or get worse.  Document Released: 12/14/2004 Document Revised: 03/11/2013 Document Reviewed: 11/13/2012 [facility] Patient Information 2015 [place], [place]. This information is not intended to replace advice given to you by your health care provider. Make sure you discuss any questions you have with your health care provider.

## 2014-08-03 NOTE — ED Notes (Signed)
Pt given PO fluids/food.

## 2014-08-04 ENCOUNTER — Encounter (HOSPITAL_COMMUNITY): Admission: EM | Disposition: A | Discharge status: Skilled Nursing Facility | Payer: Self-pay | Source: Home / Self Care

## 2014-08-04 ENCOUNTER — Emergency Department (HOSPITAL_COMMUNITY): Payer: PPO

## 2014-08-04 ENCOUNTER — Inpatient Hospital Stay (HOSPITAL_COMMUNITY)
Admission: EM | Admit: 2014-08-04 | Discharge: 2014-08-12 | DRG: 326 | Disposition: A | Discharge status: Skilled Nursing Facility | Payer: PPO | Attending: General Surgery | Admitting: General Surgery

## 2014-08-04 ENCOUNTER — Encounter (HOSPITAL_COMMUNITY): Payer: Self-pay | Admitting: Emergency Medicine

## 2014-08-04 ENCOUNTER — Inpatient Hospital Stay (HOSPITAL_COMMUNITY): Payer: PPO | Admitting: Anesthesiology

## 2014-08-04 ENCOUNTER — Inpatient Hospital Stay (HOSPITAL_COMMUNITY): Payer: PPO

## 2014-08-04 DIAGNOSIS — D649 Anemia, unspecified: Secondary | ICD-10-CM | POA: Diagnosis present

## 2014-08-04 DIAGNOSIS — R109 Unspecified abdominal pain: Secondary | ICD-10-CM

## 2014-08-04 DIAGNOSIS — G9341 Metabolic encephalopathy: Secondary | ICD-10-CM | POA: Diagnosis present

## 2014-08-04 DIAGNOSIS — E871 Hypo-osmolality and hyponatremia: Secondary | ICD-10-CM | POA: Diagnosis present

## 2014-08-04 DIAGNOSIS — K219 Gastro-esophageal reflux disease without esophagitis: Secondary | ICD-10-CM | POA: Diagnosis present

## 2014-08-04 DIAGNOSIS — K265 Chronic or unspecified duodenal ulcer with perforation: Secondary | ICD-10-CM | POA: Diagnosis not present

## 2014-08-04 DIAGNOSIS — E875 Hyperkalemia: Secondary | ICD-10-CM | POA: Diagnosis present

## 2014-08-04 DIAGNOSIS — Z515 Encounter for palliative care: Secondary | ICD-10-CM

## 2014-08-04 DIAGNOSIS — Z6838 Body mass index (BMI) 38.0-38.9, adult: Secondary | ICD-10-CM | POA: Diagnosis not present

## 2014-08-04 DIAGNOSIS — J9601 Acute respiratory failure with hypoxia: Secondary | ICD-10-CM | POA: Diagnosis not present

## 2014-08-04 DIAGNOSIS — M1990 Unspecified osteoarthritis, unspecified site: Secondary | ICD-10-CM | POA: Diagnosis present

## 2014-08-04 DIAGNOSIS — E860 Dehydration: Secondary | ICD-10-CM | POA: Diagnosis present

## 2014-08-04 DIAGNOSIS — E876 Hypokalemia: Secondary | ICD-10-CM | POA: Diagnosis not present

## 2014-08-04 DIAGNOSIS — R198 Other specified symptoms and signs involving the digestive system and abdomen: Secondary | ICD-10-CM

## 2014-08-04 DIAGNOSIS — G8929 Other chronic pain: Secondary | ICD-10-CM | POA: Diagnosis present

## 2014-08-04 DIAGNOSIS — R1011 Right upper quadrant pain: Secondary | ICD-10-CM | POA: Diagnosis present

## 2014-08-04 DIAGNOSIS — K659 Peritonitis, unspecified: Secondary | ICD-10-CM | POA: Diagnosis present

## 2014-08-04 DIAGNOSIS — Z79899 Other long term (current) drug therapy: Secondary | ICD-10-CM

## 2014-08-04 DIAGNOSIS — N179 Acute kidney failure, unspecified: Secondary | ICD-10-CM | POA: Diagnosis present

## 2014-08-04 DIAGNOSIS — Z96612 Presence of left artificial shoulder joint: Secondary | ICD-10-CM | POA: Diagnosis present

## 2014-08-04 DIAGNOSIS — I5042 Chronic combined systolic (congestive) and diastolic (congestive) heart failure: Secondary | ICD-10-CM | POA: Diagnosis present

## 2014-08-04 DIAGNOSIS — Z79891 Long term (current) use of opiate analgesic: Secondary | ICD-10-CM | POA: Diagnosis not present

## 2014-08-04 DIAGNOSIS — Z22322 Carrier or suspected carrier of Methicillin resistant Staphylococcus aureus: Secondary | ICD-10-CM

## 2014-08-04 DIAGNOSIS — Z01818 Encounter for other preprocedural examination: Secondary | ICD-10-CM | POA: Diagnosis not present

## 2014-08-04 DIAGNOSIS — F329 Major depressive disorder, single episode, unspecified: Secondary | ICD-10-CM | POA: Diagnosis present

## 2014-08-04 DIAGNOSIS — M549 Dorsalgia, unspecified: Secondary | ICD-10-CM | POA: Diagnosis present

## 2014-08-04 DIAGNOSIS — K255 Chronic or unspecified gastric ulcer with perforation: Principal | ICD-10-CM | POA: Diagnosis present

## 2014-08-04 DIAGNOSIS — K8020 Calculus of gallbladder without cholecystitis without obstruction: Secondary | ICD-10-CM | POA: Diagnosis present

## 2014-08-04 DIAGNOSIS — K660 Peritoneal adhesions (postprocedural) (postinfection): Secondary | ICD-10-CM | POA: Diagnosis present

## 2014-08-04 DIAGNOSIS — I959 Hypotension, unspecified: Secondary | ICD-10-CM

## 2014-08-04 DIAGNOSIS — R69 Illness, unspecified: Secondary | ICD-10-CM | POA: Diagnosis not present

## 2014-08-04 DIAGNOSIS — Z9911 Dependence on respirator [ventilator] status: Secondary | ICD-10-CM | POA: Diagnosis not present

## 2014-08-04 DIAGNOSIS — Z9889 Other specified postprocedural states: Secondary | ICD-10-CM

## 2014-08-04 DIAGNOSIS — J181 Lobar pneumonia, unspecified organism: Secondary | ICD-10-CM | POA: Diagnosis present

## 2014-08-04 HISTORY — PX: LAPAROTOMY: SHX154

## 2014-08-04 HISTORY — DX: Chronic or unspecified gastric ulcer with perforation: K25.5

## 2014-08-04 LAB — URINALYSIS, ROUTINE W REFLEX MICROSCOPIC
Bilirubin Urine: NEGATIVE
Glucose, UA: NEGATIVE mg/dL
Hgb urine dipstick: NEGATIVE
Ketones, ur: NEGATIVE mg/dL
Leukocytes, UA: NEGATIVE
NITRITE: NEGATIVE
Protein, ur: NEGATIVE mg/dL
Specific Gravity, Urine: 1.019 (ref 1.005–1.030)
Urobilinogen, UA: 0.2 mg/dL (ref 0.0–1.0)
pH: 5 (ref 5.0–8.0)

## 2014-08-04 LAB — COMPREHENSIVE METABOLIC PANEL
ALK PHOS: 82 U/L (ref 38–126)
ALT: 28 U/L (ref 14–54)
ANION GAP: 15 (ref 5–15)
AST: 30 U/L (ref 15–41)
Albumin: 3 g/dL — ABNORMAL LOW (ref 3.5–5.0)
BUN: 26 mg/dL — ABNORMAL HIGH (ref 6–20)
CO2: 22 mmol/L (ref 22–32)
Calcium: 9.1 mg/dL (ref 8.9–10.3)
Chloride: 97 mmol/L — ABNORMAL LOW (ref 101–111)
Creatinine, Ser: 1.6 mg/dL — ABNORMAL HIGH (ref 0.44–1.00)
GFR calc Af Amer: 34 mL/min — ABNORMAL LOW (ref >60)
GFR, EST NON AFRICAN AMERICAN: 29 mL/min — AB (ref >60)
Glucose, Bld: 142 mg/dL — ABNORMAL HIGH (ref 65–99)
POTASSIUM: 4.5 mmol/L (ref 3.5–5.1)
Sodium: 134 mmol/L — ABNORMAL LOW (ref 135–145)
Total Bilirubin: 0.8 mg/dL (ref 0.3–1.2)
Total Protein: 6.6 g/dL (ref 6.5–8.1)

## 2014-08-04 LAB — CBC WITH DIFFERENTIAL/PLATELET
BASOS ABS: 0 10*3/uL (ref 0.0–0.1)
BASOS PCT: 0 % (ref 0–1)
EOS PCT: 0 % (ref 0–5)
Eosinophils Absolute: 0 10*3/uL (ref 0.0–0.7)
HCT: 42 % (ref 36.0–46.0)
HEMOGLOBIN: 13.7 g/dL (ref 12.0–15.0)
LYMPHS ABS: 1 10*3/uL (ref 0.7–4.0)
Lymphocytes Relative: 5 % — ABNORMAL LOW (ref 12–46)
MCH: 32.7 pg (ref 26.0–34.0)
MCHC: 32.6 g/dL (ref 30.0–36.0)
MCV: 100.2 fL — ABNORMAL HIGH (ref 78.0–100.0)
Monocytes Absolute: 1.1 10*3/uL — ABNORMAL HIGH (ref 0.1–1.0)
Monocytes Relative: 6 % (ref 3–12)
NEUTROS PCT: 89 % — AB (ref 43–77)
Neutro Abs: 18.4 10*3/uL — ABNORMAL HIGH (ref 1.7–7.7)
Platelets: 503 10*3/uL — ABNORMAL HIGH (ref 150–400)
RBC: 4.19 MIL/uL (ref 3.87–5.11)
RDW: 13.5 % (ref 11.5–15.5)
WBC: 20.6 10*3/uL — AB (ref 4.0–10.5)

## 2014-08-04 LAB — CREATININE, SERUM
Creatinine, Ser: 1.19 mg/dL — ABNORMAL HIGH (ref 0.44–1.00)
GFR calc Af Amer: 48 mL/min — ABNORMAL LOW (ref >60)
GFR, EST NON AFRICAN AMERICAN: 42 mL/min — AB (ref >60)

## 2014-08-04 LAB — LIPASE, BLOOD: LIPASE: 92 U/L — AB (ref 22–51)

## 2014-08-04 LAB — MRSA PCR SCREENING: MRSA by PCR: POSITIVE — AB

## 2014-08-04 LAB — TROPONIN I: Troponin I: <0.03 ng/mL (ref <0.031)

## 2014-08-04 LAB — I-STAT CG4 LACTIC ACID, ED: Lactic Acid, Venous: 5.42 mmol/L (ref 0.5–2.0)

## 2014-08-04 SURGERY — LAPAROTOMY, EXPLORATORY
Anesthesia: General | Site: Abdomen

## 2014-08-04 MED ORDER — MIDAZOLAM HCL 2 MG/2ML IJ SOLN: 2.0000 mg | INTRAMUSCULAR | Status: DC | PRN

## 2014-08-04 MED ORDER — CETYLPYRIDINIUM CHLORIDE 0.05 % MT LIQD
7.0000 mL | Freq: Four times a day (QID) | OROMUCOSAL | Status: DC
  Administered 2014-08-05 – 2014-08-06 (×7): 7 mL via OROMUCOSAL

## 2014-08-04 MED ORDER — SUCCINYLCHOLINE CHLORIDE 20 MG/ML IJ SOLN
INTRAMUSCULAR | Status: DC | PRN
  Administered 2014-08-04: 80 mg via INTRAVENOUS

## 2014-08-04 MED ORDER — PIPERACILLIN-TAZOBACTAM 3.375 G IVPB 30 MIN
3.3750 g | Freq: Once | INTRAVENOUS | Status: AC
  Administered 2014-08-04: 3.375 g via INTRAVENOUS
  Filled 2014-08-04: qty 50

## 2014-08-04 MED ORDER — FENTANYL CITRATE (PF) 100 MCG/2ML IJ SOLN
25.0000 ug | INTRAMUSCULAR | Status: DC | PRN
  Administered 2014-08-04: 50 ug via INTRAVENOUS

## 2014-08-04 MED ORDER — FENTANYL CITRATE (PF) 100 MCG/2ML IJ SOLN
25.0000 ug | INTRAMUSCULAR | Status: DC | PRN
  Administered 2014-08-04: 25 ug via INTRAVENOUS
  Filled 2014-08-04: qty 2

## 2014-08-04 MED ORDER — VANCOMYCIN HCL IN DEXTROSE 1-5 GM/200ML-% IV SOLN
1000.0000 mg | Freq: Once | INTRAVENOUS | Status: AC
  Administered 2014-08-04: 1000 mg via INTRAVENOUS
  Filled 2014-08-04: qty 200

## 2014-08-04 MED ORDER — LACTATED RINGERS IV SOLN
INTRAVENOUS | Status: DC | PRN
  Administered 2014-08-04: 17:00:00 via INTRAVENOUS

## 2014-08-04 MED ORDER — ENOXAPARIN SODIUM 30 MG/0.3ML ~~LOC~~ SOLN: 30.0000 mg | SUBCUTANEOUS | Status: DC

## 2014-08-04 MED ORDER — SODIUM CHLORIDE 0.9 % IV BOLUS (SEPSIS)
1000.0000 mL | Freq: Once | INTRAVENOUS | Status: AC
  Administered 2014-08-04: 1000 mL via INTRAVENOUS

## 2014-08-04 MED ORDER — DEXTROSE 5 % IV SOLN
10.0000 mg | INTRAVENOUS | Status: DC | PRN
  Administered 2014-08-04: 50 ug/min via INTRAVENOUS

## 2014-08-04 MED ORDER — SODIUM CHLORIDE 0.9 % IV SOLN
1000.0000 mL | Freq: Once | INTRAVENOUS | Status: AC
  Administered 2014-08-04: 1000 mL via INTRAVENOUS

## 2014-08-04 MED ORDER — FENTANYL CITRATE (PF) 100 MCG/2ML IJ SOLN
INTRAMUSCULAR | Status: DC | PRN
  Administered 2014-08-04 (×4): 50 ug via INTRAVENOUS

## 2014-08-04 MED ORDER — FENTANYL CITRATE (PF) 250 MCG/5ML IJ SOLN
INTRAMUSCULAR | Status: AC
  Filled 2014-08-04: qty 5

## 2014-08-04 MED ORDER — PROPOFOL 10 MG/ML IV BOLUS
INTRAVENOUS | Status: AC
  Filled 2014-08-04: qty 20

## 2014-08-04 MED ORDER — MIDAZOLAM HCL 2 MG/2ML IJ SOLN
2.0000 mg | INTRAMUSCULAR | Status: DC | PRN
  Administered 2014-08-04: 2 mg via INTRAVENOUS
  Filled 2014-08-04: qty 2

## 2014-08-04 MED ORDER — FENTANYL CITRATE (PF) 100 MCG/2ML IJ SOLN
100.0000 ug | INTRAMUSCULAR | Status: DC | PRN
  Administered 2014-08-04 – 2014-08-05 (×2): 100 ug via INTRAVENOUS
  Filled 2014-08-04 (×2): qty 2

## 2014-08-04 MED ORDER — ROCURONIUM BROMIDE 100 MG/10ML IV SOLN
INTRAVENOUS | Status: DC | PRN
  Administered 2014-08-04: 40 mg via INTRAVENOUS
  Administered 2014-08-04: 10 mg via INTRAVENOUS
  Administered 2014-08-04: 30 mg via INTRAVENOUS

## 2014-08-04 MED ORDER — VANCOMYCIN HCL IN DEXTROSE 1-5 GM/200ML-% IV SOLN: 1000.0000 mg | INTRAVENOUS | Status: DC

## 2014-08-04 MED ORDER — POTASSIUM CHLORIDE 2 MEQ/ML IV SOLN
INTRAVENOUS | Status: DC
  Filled 2014-08-04: qty 1000

## 2014-08-04 MED ORDER — FENTANYL CITRATE (PF) 100 MCG/2ML IJ SOLN
100.0000 ug | INTRAMUSCULAR | Status: DC | PRN
  Administered 2014-08-05 (×2): 100 ug via INTRAVENOUS
  Filled 2014-08-04 (×3): qty 2

## 2014-08-04 MED ORDER — PROPOFOL 10 MG/ML IV BOLUS
INTRAVENOUS | Status: DC | PRN
  Administered 2014-08-04: 80 mg via INTRAVENOUS

## 2014-08-04 MED ORDER — FENTANYL CITRATE (PF) 100 MCG/2ML IJ SOLN: 25.0000 ug | INTRAMUSCULAR | Status: DC | PRN

## 2014-08-04 MED ORDER — CHLORHEXIDINE GLUCONATE 0.12 % MT SOLN
15.0000 mL | Freq: Two times a day (BID) | OROMUCOSAL | Status: DC
  Administered 2014-08-04 – 2014-08-07 (×6): 15 mL via OROMUCOSAL
  Filled 2014-08-04 (×6): qty 15

## 2014-08-04 MED ORDER — PANTOPRAZOLE SODIUM 40 MG IV SOLR
40.0000 mg | Freq: Two times a day (BID) | INTRAVENOUS | Status: DC
  Administered 2014-08-04 – 2014-08-09 (×10): 40 mg via INTRAVENOUS
  Filled 2014-08-04 (×11): qty 40

## 2014-08-04 MED ORDER — KCL IN DEXTROSE-NACL 20-5-0.45 MEQ/L-%-% IV SOLN
INTRAVENOUS | Status: DC
  Administered 2014-08-04 – 2014-08-05 (×2): via INTRAVENOUS
  Filled 2014-08-04 (×2): qty 1000

## 2014-08-04 MED ORDER — PIPERACILLIN-TAZOBACTAM 3.375 G IVPB
3.3750 g | Freq: Three times a day (TID) | INTRAVENOUS | Status: DC
  Administered 2014-08-04 – 2014-08-11 (×20): 3.375 g via INTRAVENOUS
  Filled 2014-08-04 (×23): qty 50

## 2014-08-04 SURGICAL SUPPLY — 48 items
APPLICATOR COTTON TIP 6IN STRL (MISCELLANEOUS) ×8 IMPLANT
BLADE EXTENDED COATED 6.5IN (ELECTRODE) IMPLANT
BLADE HEX COATED 2.75 (ELECTRODE) ×4 IMPLANT
BLADE SURG SZ10 CARB STEEL (BLADE) ×4 IMPLANT
CLIP TI LARGE 6 (CLIP) IMPLANT
COVER MAYO STAND STRL (DRAPES) ×4 IMPLANT
DRAPE LAPAROSCOPIC ABDOMINAL (DRAPES) ×4 IMPLANT
DRAPE SHEET LG 3/4 BI-LAMINATE (DRAPES) IMPLANT
DRAPE WARM FLUID 44X44 (DRAPE) ×4 IMPLANT
DRSG PAD ABDOMINAL 8X10 ST (GAUZE/BANDAGES/DRESSINGS) ×4 IMPLANT
ELECT REM PT RETURN 9FT ADLT (ELECTROSURGICAL) ×4
ELECTRODE REM PT RTRN 9FT ADLT (ELECTROSURGICAL) ×2 IMPLANT
GAUZE SPONGE 4X4 12PLY STRL (GAUZE/BANDAGES/DRESSINGS) ×4 IMPLANT
GLOVE BIOGEL PI IND STRL 7.0 (GLOVE) ×2 IMPLANT
GLOVE BIOGEL PI IND STRL 7.5 (GLOVE) ×2 IMPLANT
GLOVE BIOGEL PI INDICATOR 7.0 (GLOVE) ×2
GLOVE BIOGEL PI INDICATOR 7.5 (GLOVE) ×2
GLOVE ECLIPSE 7.5 STRL STRAW (GLOVE) ×8 IMPLANT
GOWN STRL REUS W/TWL LRG LVL3 (GOWN DISPOSABLE) ×4 IMPLANT
GOWN STRL REUS W/TWL XL LVL3 (GOWN DISPOSABLE) ×8 IMPLANT
KIT BASIN OR (CUSTOM PROCEDURE TRAY) ×4 IMPLANT
LEGGING LITHOTOMY PAIR STRL (DRAPES) IMPLANT
LIGASURE IMPACT 36 18CM CVD LR (INSTRUMENTS) IMPLANT
NS IRRIG 1000ML POUR BTL (IV SOLUTION) ×8 IMPLANT
PACK GENERAL/GYN (CUSTOM PROCEDURE TRAY) ×4 IMPLANT
SHEARS HARMONIC ACE PLUS 36CM (ENDOMECHANICALS) IMPLANT
STAPLER VISISTAT 35W (STAPLE) ×4 IMPLANT
SUCTION POOLE TIP (SUCTIONS) ×4 IMPLANT
SUT NOV 1 T60/GS (SUTURE) IMPLANT
SUT NOVA 1 T20/GS 25DT (SUTURE) IMPLANT
SUT NOVA NAB DX-16 0-1 5-0 T12 (SUTURE) ×12 IMPLANT
SUT NOVA T20/GS 25 (SUTURE) IMPLANT
SUT PDS AB 1 CTX 36 (SUTURE) IMPLANT
SUT PDS AB 1 TP1 96 (SUTURE) IMPLANT
SUT SILK 2 0 (SUTURE) ×4
SUT SILK 2 0 SH CR/8 (SUTURE) ×4 IMPLANT
SUT SILK 2 0SH CR/8 30 (SUTURE) ×4 IMPLANT
SUT SILK 2-0 18XBRD TIE 12 (SUTURE) ×2 IMPLANT
SUT SILK 2-0 30XBRD TIE 12 (SUTURE) IMPLANT
SUT SILK 3 0 (SUTURE) ×8
SUT SILK 3 0 SH CR/8 (SUTURE) ×4 IMPLANT
SUT SILK 3-0 18XBRD TIE 12 (SUTURE) ×4 IMPLANT
SUT VIC AB 3-0 54XBRD REEL (SUTURE) IMPLANT
SUT VIC AB 3-0 BRD 54 (SUTURE)
TAPE CLOTH SURG 6X10 WHT LF (GAUZE/BANDAGES/DRESSINGS) ×4 IMPLANT
TOWEL OR 17X26 10 PK STRL BLUE (TOWEL DISPOSABLE) ×4 IMPLANT
TRAY FOLEY W/METER SILVER 14FR (SET/KITS/TRAYS/PACK) ×4 IMPLANT
YANKAUER SUCT BULB TIP NO VENT (SUCTIONS) ×4 IMPLANT

## 2014-08-04 NOTE — Anesthesia Procedure Notes (Signed)
Procedure Name: Intubation Date/Time: 08/04/2014 5:33 PM Performed by: Anne Fu Pre-anesthesia Checklist: Patient identified, Emergency Drugs available, Suction available, Patient being monitored and Timeout performed Patient Re-evaluated:Patient Re-evaluated prior to inductionOxygen Delivery Method: Circle system utilized Preoxygenation: Pre-oxygenation with 100% oxygen Intubation Type: IV induction Ventilation: Mask ventilation without difficulty Laryngoscope Size: Mac and 4 Grade View: Grade I Tube type: Oral Tube size: 7.5 mm Number of attempts: 1 Airway Equipment and Method: Stylet Placement Confirmation: ETT inserted through vocal cords under direct vision,  positive ETCO2,  CO2 detector and breath sounds checked- equal and bilateral Secured at: 22 cm Tube secured with: Tape Dental Injury: Teeth and Oropharynx as per pre-operative assessment

## 2014-08-04 NOTE — ED Notes (Signed)
Pt brought from home for right upper quadrant abdominal pain radiating to shoulders, pt recently in hospital for gallbladder. Per EMS pt had altered mental status, tachypnea, and hypotension. Pt's husband states patient takes an excessive amount of her prescribed pain medications. At present, pt is hypertensive and alert and oriented.

## 2014-08-04 NOTE — Op Note (Signed)
Preoperative Diagnosis: Perforated viscus [R69] Hypotension [I95.9]  Postoprative Diagnosis:perforated pyloric ulcer   Procedure: Procedure(s): EXPLORATORY LAPAROTOMY, REPAIR OF PERFORATED ULCER   Surgeon: Excell Seltzer T   Assistants: Johnathan Hausen  Anesthesia:  General endotracheal anesthesia  Indications: patient is an 81 year old female with multiple significant medical problems who presents with 3 days of acute and worsening upper abdominal pain. CT scan shows free air and fluid in the upper abdomen consistent with a perforated viscus, probable perforated ulcer. After a frank discussion with the patient and the family regarding her very high risk of surgery and alternatives they've elected to proceed with surgical intervention for closure of her perforated ulcer. They understand that she is very likely to be ventilator dependent postoperatively and at high risk for complications including death.    Procedure Detail:  Patient was brought to the operating room, placed in supine position on the operating table, and general endotracheal anesthesia induced. She received preoperative broad-spectrum IV antibiotics. PAS replaced. Foley catheter was placed. The abdomen was widely sterilely prepped and draped. Patient timeout was performed and correct procedure verified. The patient had a previous long midline incision and had history of bowel obstruction lysis of adhesions and 2 ventral hernia repairs with mesh. I used the upper portion of the incision and dissection was carried down through the subcutaneous tissue toward the fascia. There was a hernia above a previously placed piece of mesh. I was able to enter the peritoneal cavity through the hernia sac. Hernia sac was debrided. I then extended the incision somewhat up toward the xiphoid and down toward the firm area of intra-abdominal mesh at the lower end of the incision. I did not incise into the mesh. There was obvious bilious cloudy  fluid in the upper abdomen which was suctioned and I was able to adequately expose the distal stomach and duodenum and there was a approximate 1 cm discrete perforation around the pyloric channel. I really could not explore the upper abdomen due to adhesions but I was able to pass the sucker up in the left upper quadrant and suctioned a large amount of cloudy greenish fluid. There were fairly dense adhesions down in the lower abdomen that prevented significant contamination  And the rest of the peritoneal cavity. The perforation was exposed and then I closed this transversely with 4 interrupted 2-0 silk sutures with large bites of the gastric wall and mucosa. This closed adequately and then a piece of omentum was brought up into the top most suture which was tied over it patching with omentum. The abdomen that we could expose with open peritoneal access was thoroughly irrigated with saline until clear. There was no bleeding. The fascia was in very poor condition with the previous hernia. I did mobilize the fascia on either side raising short skin is simultaneous flaps. The fascia was then closed with interrupted #1 Novafil with some tension extending down toward the mesh. The wound was left open and packed with moist saline gauze. Sponge needle and sponge counts were correct. Patient was transported to the ICU intubated in critical but stable condition    Findings: As above  Estimated Blood Loss:  Minimal         Drains: none  Blood Given: none          Specimens: culture and sensitivity        Complications:  * No complications entered in OR log *         Disposition: ICU - intubated and hemodynamically  stable.         Condition: stable

## 2014-08-04 NOTE — ED Notes (Signed)
Bed: RESA Expected date:  Expected time:  Means of arrival:  Comments: EMS- 81yo F, abdominal pain, hypotensive

## 2014-08-04 NOTE — Anesthesia Postprocedure Evaluation (Signed)
  Anesthesia Post-op Note  Patient: Monique Russo  Procedure(s) Performed: Procedure(s) (LRB): EXPLORATORY LAPAROTOMY, REPAIR OF PERFORATED ULCER (N/A)  Patient Location: ICU     Anesthesia Type: General  Level of Consciousness: sedated  Airway and Oxygen Therapy: Intubated and ventilated.  Post-op Pain: None observed.  Post-op Assessment: Post-op Vital signs reviewed, HR 102, BP 115/75  Last Vitals:  Filed Vitals:   08/04/14 1939  BP: 137/62  Pulse: 105  Temp:   Resp: 15    Post-op Vital Signs: stable   Complications: No apparent anesthesia complications. Multiple attempts for arterial line unsuccessful including use of ultrasound.

## 2014-08-04 NOTE — Transfer of Care (Signed)
Immediate Anesthesia Transfer of Care Note  Patient: Monique Russo  Procedure(s) Performed: Procedure(s): EXPLORATORY LAPAROTOMY, REPAIR OF PERFORATED ULCER (N/A)  Patient Location: ICU  Anesthesia Type:General  Level of Consciousness: sedated  Airway & Oxygen Therapy: Patient remains intubated per anesthesia plan and Patient placed on Ventilator (see vital sign flow sheet for setting)  Post-op Assessment: Report given to RN and Post -op Vital signs reviewed and stable  Post vital signs: Reviewed and stable  Last Vitals:  Filed Vitals:   08/04/14 1630  BP: 104/60  Pulse:   Temp:   Resp: 21    Complications: No apparent anesthesia complications

## 2014-08-04 NOTE — Progress Notes (Signed)
ANTIBIOTIC CONSULT NOTE - INITIAL  Pharmacy Consult for Vancomycin, Zosyn Indication: rule out sepsis  No Known Allergies  Patient Measurements:   Height: 5" Weight: 83.9 kg  Vital Signs: Temp: 100.4 F (38 C) (05/17 1303) Temp Source: Rectal (05/17 1303) BP: 133/71 mmHg (05/17 1330) Pulse Rate: 113 (05/17 1241) Intake/Output from previous day:   Intake/Output from this shift:    Labs:  Recent Labs  08/02/14 2111 08/04/14 1256  WBC 10.0 20.6*  HGB 12.9 13.7  PLT 451* 503*  CREATININE 0.82 1.60*   Estimated Creatinine Clearance: 26.5 mL/min (by C-G formula based on Cr of 1.6). No results for input(s): VANCOTROUGH, VANCOPEAK, VANCORANDOM, GENTTROUGH, GENTPEAK, GENTRANDOM, TOBRATROUGH, TOBRAPEAK, TOBRARND, AMIKACINPEAK, AMIKACINTROU, AMIKACIN in the last 72 hours.   Microbiology: Recent Results (from the past 720 hour(s))  Blood culture (routine x 2)     Status: None   Collection Time: 07/17/14  6:22 PM  Result Value Ref Range Status   Specimen Description BLOOD RIGHT HAND  Final   Special Requests BOTTLES DRAWN AEROBIC ONLY 5ML  Final   Culture   Final    NO GROWTH 5 DAYS Performed at [facility]    Report Status 07/24/2014 FINAL  Final  Blood culture (routine x 2)     Status: None   Collection Time: 07/17/14  6:23 PM  Result Value Ref Range Status   Specimen Description BLOOD RIGHT ARM  Final   Special Requests BOTTLES DRAWN AEROBIC AND ANAEROBIC 5CC  Final   Culture   Final    NO GROWTH 5 DAYS Performed at [facility]    Report Status 07/24/2014 FINAL  Final  Culture, Urine     Status: None   Collection Time: 07/17/14 10:46 PM  Result Value Ref Range Status   Specimen Description URINE, CATHETERIZED  Final   Special Requests NONE  Final   Colony Count NO GROWTH Performed at [facility]   Final   Culture NO GROWTH Performed at [facility]   Final   Report Status 07/18/2014 FINAL  Final  Culture,  sputum-assessment     Status: None   Collection Time: 07/19/14  2:41 AM  Result Value Ref Range Status   Specimen Description SPUTUM  Final   Special Requests NONE  Final   Sputum evaluation   Final    THIS SPECIMEN IS ACCEPTABLE. RESPIRATORY CULTURE REPORT TO FOLLOW.   Report Status 07/19/2014 FINAL  Final  Culture, respiratory (NON-Expectorated)     Status: None   Collection Time: 07/19/14  2:41 AM  Result Value Ref Range Status   Specimen Description SPUTUM  Final   Special Requests NONE  Final   Gram Stain   Final    MODERATE WBC PRESENT,BOTH PMN AND MONONUCLEAR RARE SQUAMOUS EPITHELIAL CELLS PRESENT MODERATE GRAM POSITIVE COCCI IN PAIRS IN CLUSTERS IN CHAINS Performed at [facility]    Culture   Final    NORMAL OROPHARYNGEAL FLORA Performed at [facility]    Report Status 07/21/2014 FINAL  Final    Medical History: Past Medical History  Diagnosis Date  . Arthritis   . CHF (congestive heart failure)   . Pneumonia   . Depression   . GERD (gastroesophageal reflux disease)     Assessment: 81 y/o F with PMH of CHF, arthritis, depression, GERD who presents with RUQ abdominal pain radiating to shoulders. Per EMS, pt had AMS, tachypnea, and hypotension on their arrival. Patient febrile and hypotensive in ED.  CT of abdomen/pelvis ordered. Pharmacy consulted to dose Vancomycin and Zosyn for possible sepsis.  5/17 >> Vancomycin >> 5/17 >> Zosyn >>    5/17 blood x 2: sent 5/17 Lactic Acid: 5.42, elevated  Goal of Therapy:  Vancomycin trough level 15-20 mcg/ml  Appropriate antibiotic dosing for renal function and indication Eradication of infection  Plan:   Vancomycin 1g IV x 1 given in ED. Continue with Vancomycin 1g IV q24h to start 5/18 at 0600.  Plan for Vancomycin trough level at steady state.  Zosyn 3.375g IV x 1 over 30 min given in ED. Continue with Zosyn 3.375g IV q8h (infuse over 4 hours).  Monitor renal function, cultures, clinical  course.   Lindell Spar, PharmD, BCPS Pager: [phone] 08/04/2014 2:25 PM

## 2014-08-04 NOTE — Progress Notes (Signed)
eLink Physician-Brief Progress Note Patient Name: Monique Russo DOB: 1930/05/11 MRN: [id number]   Date of Service  08/04/2014  HPI/Events of Note  Lactic Acid = 3.9.  eICU Interventions  Will bolus with 0.9 NaCl 1 liter IV over 1 hour now.      Intervention Category Intermediate Interventions: Diagnostic test evaluation  Sommer,Steven Eugene 08/04/2014, 10:09 PM

## 2014-08-04 NOTE — Anesthesia Preprocedure Evaluation (Addendum)
Anesthesia Evaluation  Patient identified by MRN, date of birth, ID band Patient awake    Reviewed: Allergy & Precautions, NPO status , Patient's Chart, lab work & pertinent test results  Airway Mallampati: II  TM Distance: >3 FB Neck ROM: Full    Dental no notable dental hx.    Pulmonary pneumonia -, resolved,  CXR: Findings concerning for mild interstitial edema.  Admitted 07-17-14 for CHF versus pneumonia. Discharged 07-20-14. Repeat ECHO with EF 35% (according to discharge summary. I don't see this report.)   Pulmonary hypertension with PAP 41 mm Hg breath sounds clear to auscultation  Pulmonary exam normal       Cardiovascular +CHF Normal cardiovascular examRhythm:Regular Rate:Normal  ECHO: 07-18-14: LV EF 55% RV mildly dilated with decreased EF   Neuro/Psych PSYCHIATRIC DISORDERS Depression negative neurological ROS     GI/Hepatic negative GI ROS, Neg liver ROS, GERD-  ,  Endo/Other  negative endocrine ROS  Renal/GU Renal InsufficiencyRenal disease  negative genitourinary   Musculoskeletal  (+) Arthritis -,   Abdominal (+) + obese,   Peds negative pediatric ROS (+)  Hematology negative hematology ROS (+)   Anesthesia Other Findings   Reproductive/Obstetrics negative OB ROS                          Anesthesia Physical Anesthesia Plan  ASA: IV and emergent  Anesthesia Plan: General   Post-op Pain Management:    Induction: Intravenous  Airway Management Planned: Oral ETT  Additional Equipment:   Intra-op Plan:   Post-operative Plan: Extubation in OR and Possible Post-op intubation/ventilation  Informed Consent: I have reviewed the patients History and Physical, chart, labs and discussed the procedure including the risks, benefits and alternatives for the proposed anesthesia with the patient or authorized representative who has indicated his/her understanding and acceptance.    Dental advisory given  Plan Discussed with: CRNA  Anesthesia Plan Comments: (Very high risk with h/o systolic and diastolic heart failure recently, possible pneumonia, pulmonary hypertension.)      Anesthesia Quick Evaluation

## 2014-08-04 NOTE — ED Provider Notes (Signed)
CSN: [id number]     Arrival date & time 08/04/14  1237 History   First MD Initiated Contact with Patient 08/04/14 1245     Chief Complaint  Patient presents with  . Hypotension  . Abdominal Pain     (Consider location/radiation/quality/duration/timing/severity/associated sxs/prior Treatment) HPI  Patient presents with concern of ongoing upper abdominal pain. Pain is focally in the epigastrium, radiating towards the left shoulder. Patient is present for several days, worse over the past 2 or There is associated nausea, anorexia, no vomiting. There is associated dyspnea, generalized discomfort. No confusion, disorientation. Patient was seen at another facility yesterday. She states that since that time she has been persistently ill. EMS reports that on their arrival the patient was hypotensive, listless. No clear alleviating or exacerbating factors.   Past Medical History  Diagnosis Date  . Arthritis   . CHF (congestive heart failure)   . Pneumonia   . Depression   . GERD (gastroesophageal reflux disease)    Past Surgical History  Procedure Laterality Date  . Abdominal hysterectomy    . Joint replacement Left     shoulder  . Reverse shoulder arthroplasty Right 12/04/2013  . Reverse shoulder arthroplasty  12/04/2013    Procedure: REVERSE SHOULDER ARTHROPLASTY;  Surgeon: Nita Sells, MD;  Location: [facility] OR;  Service: Orthopedics;;  Right reverse total shoulder arthroplasty  . Back surgery  2000  . Colon surgery      x2-blockage  . Hernia repair  2012  . Orif wrist fracture  2012    left  . Ercp    . Colonoscopy    . Rotator cuff repair      dr Tamera Punt  . Shoulder arthroscopy  2012    lt and rt  . Tendon repair Right 04/13/2014    Procedure: RIGHT HAND CENTRAL TENDON CENTRALIZATION OF LONG AND RING FINGERS;  Surgeon: Jolyn Nap, MD;  Location: [facility];  Service: Orthopedics;  Laterality: Right;   Family History  Problem Relation Age  of Onset  . Stroke Mother   . Breast cancer Sister   . Ulcerative colitis Daughter   . Chronic fatigue Daughter   . Chronic fatigue Daughter    History  Substance Use Topics  . Smoking status: Never Smoker   . Smokeless tobacco: Never Used  . Alcohol Use: 4.2 oz/week    7 Glasses of wine per week     Comment: 8 oz -12 oz of wine every    OB History    No data available     Review of Systems  Constitutional:       Per HPI, otherwise negative  HENT:       Per HPI, otherwise negative  Respiratory:       Per HPI, otherwise negative  Cardiovascular:       Per HPI, otherwise negative  Gastrointestinal: Positive for vomiting and abdominal pain.  Endocrine:       Negative aside from HPI  Genitourinary:       Neg aside from HPI   Musculoskeletal:       Per HPI, otherwise negative  Skin: Negative.   Neurological: Negative for syncope.      Allergies  Review of patient's allergies indicates no known allergies.  Home Medications   Prior to Admission medications   Medication Sig Start Date End Date Taking? Authorizing Provider  acetaminophen (TYLENOL) 325 MG tablet Take 325 mg by mouth every 6 (six) hours as needed for mild pain  or headache. 07/10/13   Melton Alar, PA-C  Ascorbic Acid (VITAMIN C) 1000 MG tablet Take 1,000 mg by mouth daily.    Historical Provider, MD  b complex vitamins tablet Take 1 tablet by mouth daily as needed.     Historical Provider, MD  bisacodyl (DULCOLAX) 10 MG suppository Place 1 suppository (10 mg total) rectally daily as needed for moderate constipation (May repeat times one). 07/10/13   Bobby Rumpf York, PA-C  budesonide (RHINOCORT AQUA) 32 MCG/ACT nasal spray Place 1 spray into both nostrils daily as needed for rhinitis.    Historical Provider, MD  cholecalciferol (VITAMIN D) 1000 UNITS tablet Take 1,000 Units by mouth daily.    Historical Provider, MD  diclofenac (VOLTAREN) 50 MG EC tablet Take 50 mg by mouth 2 (two) times daily.      Historical Provider, MD  diclofenac sodium (VOLTAREN) 1 % GEL Apply topically every three (3) days as needed (For Leg pain). 1 gm    Historical Provider, MD  docusate sodium (COLACE) 100 MG capsule Take 1 capsule (100 mg total) by mouth 3 (three) times daily as needed. 12/08/13   Grier Mitts, PA-C  DULoxetine (CYMBALTA) 60 MG capsule Take 60 mg by mouth daily. At bedtime    Historical Provider, MD  furosemide (LASIX) 80 MG tablet Take 80 mg by mouth daily. Prescribed as needed    Historical Provider, MD  gabapentin (NEURONTIN) 400 MG capsule Take 400 mg by mouth 2 (two) times daily.     Historical Provider, MD  magnesium hydroxide (MILK OF MAGNESIA) 400 MG/5ML suspension Take 30 mLs by mouth daily as needed for mild constipation.    Historical Provider, MD  methadone (DOLOPHINE) 5 MG tablet Take 5 mg by mouth every 8 (eight) hours.     Historical Provider, MD  methocarbamol (ROBAXIN) 750 MG tablet Take 750 mg by mouth every 8 (eight) hours as needed (Prescribed TID but patient takes PRN).     Historical Provider, MD  Omega-3 Fatty Acids (FISH OIL) 1200 MG CAPS Take 2 capsules by mouth daily.    Historical Provider, MD  oxyCODONE-acetaminophen (PERCOCET/ROXICET) 5-325 MG per tablet Take 1 tablet by mouth every 6 (six) hours as needed for severe pain. 08/03/14   Merryl Hacker, MD  potassium chloride SA (K-DUR,KLOR-CON) 20 MEQ tablet Take 20 mEq by mouth daily as needed (Only takes with lasix).    Historical Provider, MD  temazepam (RESTORIL) 30 MG capsule Take 1 capsule (30 mg total) by mouth at bedtime as needed for sleep. 07/10/13   Bobby Rumpf York, PA-C  timolol (BETIMOL) 0.5 % ophthalmic solution Place 1 drop into both eyes daily.     Historical Provider, MD  vitamin E 400 UNIT capsule Take 400 Units by mouth daily.     Historical Provider, MD   BP 133/71 mmHg  Pulse 113  Temp(Src) 100.4 F (38 C) (Rectal)  Resp 15  SpO2 98% Physical Exam  Constitutional: She is oriented to person,  place, and time. She appears well-developed and well-nourished. She appears ill. She appears distressed.  HENT:  Head: Normocephalic and atraumatic.  Eyes: Conjunctivae and EOM are normal.  Cardiovascular: Normal rate and regular rhythm.   Pulmonary/Chest: Effort normal and breath sounds normal. No stridor. No respiratory distress.  Abdominal: She exhibits no distension. There is tenderness in the epigastric area and periumbilical area. There is guarding.    Musculoskeletal: She exhibits no edema.  Neurological: She is alert and oriented to person, place,  and time. No cranial nerve deficit.  Skin: Skin is warm and dry.  Psychiatric: She has a normal mood and affect.  Nursing note and vitals reviewed.   ED Course  Procedures (including critical care time) Labs Review Labs Reviewed  CBC WITH DIFFERENTIAL/PLATELET - Abnormal; Notable for the following:    WBC 20.6 (*)    MCV 100.2 (*)    Platelets 503 (*)    Neutrophils Relative % 89 (*)    Neutro Abs 18.4 (*)    Lymphocytes Relative 5 (*)    Monocytes Absolute 1.1 (*)    All other components within normal limits  COMPREHENSIVE METABOLIC PANEL - Abnormal; Notable for the following:    Sodium 134 (*)    Chloride 97 (*)    Glucose, Bld 142 (*)    BUN 26 (*)    Creatinine, Ser 1.60 (*)    Albumin 3.0 (*)    GFR calc non Af Amer 29 (*)    GFR calc Af Amer 34 (*)    All other components within normal limits  LIPASE, BLOOD - Abnormal; Notable for the following:    Lipase 92 (*)    All other components within normal limits  I-STAT CG4 LACTIC ACID, ED - Abnormal; Notable for the following:    Lactic Acid, Venous 5.42 (*)    All other components within normal limits  CULTURE, BLOOD (ROUTINE X 2)  CULTURE, BLOOD (ROUTINE X 2)  TROPONIN I  URINALYSIS, ROUTINE W REFLEX MICROSCOPIC    Imaging Review Dg Chest 2 View  08/02/2014   CLINICAL DATA:  Shortness of breath and abdominal pain today.  EXAM: CHEST  2 VIEW  COMPARISON:   07/18/2014  FINDINGS: Lungs are adequately inflated without consolidation or effusion. Mild linear atelectasis in the left perihilar region. Cardiomediastinal silhouette is within normal. There is a large hiatal hernia unchanged. There is mild calcified plaque over the aortic arch. Remainder the exam is unchanged.  IMPRESSION: No acute cardiopulmonary disease.   Electronically Signed   By: Marin Olp M.D.   On: 08/02/2014 21:59   US Abdomen Limited Ruq  08/03/2014   CLINICAL DATA:  Acute onset of right upper quadrant abdominal pain. Initial encounter.  EXAM: US ABDOMEN LIMITED - RIGHT UPPER QUADRANT  COMPARISON:  None.  FINDINGS: Gallbladder:  A few small stones are noted layering dependently within the gallbladder, measuring up to 5 mm in size. Mild gallbladder wall thickening is noted. The patient has focal right upper quadrant tenderness, without definite ultrasonographic Murphy's sign. No pericholecystic fluid is seen.  Common bile duct:  Diameter: 1.0 cm, dilated in caliber.  Liver:  No focal lesion identified. Mildly increased parenchymal echogenicity and coarsened echotexture, likely reflecting fatty infiltration.  IMPRESSION: 1. Cholelithiasis, with mild apparent gallbladder wall thickening. Focal right upper quadrant tenderness, without definite ultrasonographic Murphy's sign. Common bile duct dilated to 1.0 cm in diameter. This could reflect some degree of obstruction due to a stone in the distal common bile duct. MRCP could be considered further evaluation. 2. Mild fatty infiltration within the liver.   Electronically Signed   By: Garald Balding M.D.   On: 08/03/2014 01:42     EKG Interpretation   Date/Time:  Tuesday Aug 04 2014 12:41:22 EDT Ventricular Rate:  115 PR Interval:  149 QRS Duration: 84 QT Interval:  346 QTC Calculation: 479 R Axis:   -33 Text Interpretation:  Sinus tachycardia Left axis deviation Borderline T  abnormalities, anterior leads Baseline wander in  lead(s) II III  aVF Sinus  tachycardia T wave abnormality Artifact Abnormal ekg Confirmed by  Carmin Muskrat  MD [phone]) on 08/04/2014 12:45:32 PM      Patient's blood pressure is initially minimally palpable, with manual auscultation it is 69/37.  Patient is febrile. Patient had sepsis resuscitation bundle initiated, with broad-spectrum antibiotics, blood cultures, continued fluid resuscitation.   Patient's initial labs notable for lactic acidosis, leukocytosis, acute kidney injury, with doubling of creatinine since yesterday.  Update: Patient continues to complain of severe pain. Patient is receiving fentanyl, fluids.  Update: I discussed the patient's case with our radiologist, as the patient has notable free air in the abdomen. Patient has a revision of bowel.  I discussed patient's case with our surgical colleagues, who will assist with management.  MDM   Final diagnoses:  Hypotension   Patient presents with concern of ongoing abdominal pain, is initially hypotensive, tachycardic, febrile. With particular tenderness, patient and emergent CT scan performed, in addition to labs. Initial resuscitation included broad-spectrum antibiotics, IV fluids and analgesia. CT scan demonstrated perforated bowel or Patient required admission to the surgical team for further evaluation, management.  CRITICAL CARE Performed by: Carmin Muskrat Total critical care time: 45 Critical care time was exclusive of separately billable procedures and treating other patients. Critical care was necessary to treat or prevent imminent or life-threatening deterioration. Critical care was time spent personally by me on the following activities: development of treatment plan with patient and/or surrogate as well as nursing, discussions with consultants, evaluation of patient's response to treatment, examination of patient, obtaining history from patient or surrogate, ordering and performing treatments and interventions,  ordering and review of laboratory studies, ordering and review of radiographic studies, pulse oximetry and re-evaluation of patient's condition.    Carmin Muskrat, MD 08/04/14 [phone]

## 2014-08-04 NOTE — H&P (Signed)
Monique Russo [birth year]  [id number].  Pneumoperitoneum  Chief Complaint/Reason for Consult: free air HPI: This is an 81 yo white female who has a history of CHF, actually apparently was on hospice for this last year per palliative care's note but she came off of this in September.  Her husband states today she was just diagnosed with CHF a couple of weeks ago when she was admitted for PNA.  She has not seen a cardiologist and was supposed to see Dr. Sallyanne Kuster, but has not had her appointment yet.  She also has chronic back pain and has a history of narcotic use.  On Sunday, she started having some RUQ abdominal pain.  Her husband brought her to the ED and they felt this may be from her gallstones and she was encouraged to follow up with [place] for further evaluation.  She was having some nausea, but no real emesis.  Her pain persisted and worsened yesterday.  She has been having chills and sweats.  She had a BM 2 days ago that was normal with no blood.  She denies use of NSAIDs.  She was brought back to the [facility] today for continued abdominal pain.  She was found to have perforated viscus with pneumoperitoneum on her scan today.  Site is unable to be located, but there is some gastric antral thickening noted.  Her WBC is 20K and creatinine is 1.6.  We have been asked to see her for admission.  ROS : Please see HPI, otherwise, the patient can not provide a detailed ROS due to pain  Family History  Problem Relation Age of Onset  . Stroke Mother   . Breast cancer Sister   . Ulcerative colitis Daughter   . Chronic fatigue Daughter   . Chronic fatigue Daughter     Past Medical History  Diagnosis Date  . Arthritis   . CHF (congestive heart failure)   . Pneumonia   . Depression   . GERD (gastroesophageal reflux disease)     Past Surgical History  Procedure Laterality Date  . Abdominal hysterectomy    . Joint replacement Left     shoulder  . Reverse shoulder arthroplasty Right 12/04/2013  .  Reverse shoulder arthroplasty  12/04/2013    Procedure: REVERSE SHOULDER ARTHROPLASTY;  Surgeon: Nita Sells, MD;  Location: [facility] OR;  Service: Orthopedics;;  Right reverse total shoulder arthroplasty  . Back surgery  2000  . Colon surgery      x2-blockage  . Hernia repair  2012    x2 with mesh  . Orif wrist fracture  2012    left  . Ercp    . Colonoscopy    . Rotator cuff repair      dr Tamera Punt  . Shoulder arthroscopy  2012    lt and rt  . Tendon repair Right 04/13/2014    Procedure: RIGHT HAND CENTRAL TENDON CENTRALIZATION OF LONG AND RING FINGERS;  Surgeon: Jolyn Nap, MD;  Location: [place];  Service: Orthopedics;  Laterality: Right;    Social History:  reports that she has never smoked. She has never used smokeless tobacco. She reports that she drinks about 4.2 oz of alcohol per week. She reports that she does not use illicit drugs.  Allergies: No Known Allergies   (Not in a hospital admission)  Blood pressure 117/55, pulse 101, temperature 100.4 F (38 C), temperature source Rectal, resp. rate 15, SpO2 93 %. Physical Exam: General: obese white female who is  laying in bed in distress secondary to pain and screaming at times HEENT: head is normocephalic, atraumatic.  Sclera are noninjected.  PERRL.  Ears and nose without any masses or lesions, but does have a scratch on the inside of her left ear.  Mouth is pink Heart: regular rhythm, but slightly tachycardia.  Normal s1,s2. No obvious murmurs, gallops, or rubs noted.  Palpable radial and pedal pulses bilaterally Lungs: CTAB, no wheezes, rhonchi, or rales noted.  Respiratory effort somewhat labored due to pain Abd: soft, diffusely tender, but greatest in the upper abdomen, maybe minimal distention, +BS, no masses, hernias, or organomegaly, lower midline scar from prior surgery MS: all 4 extremities are symmetrical with no cyanosis, clubbing, she does have peripheral pitting edema in her lower  extremities Skin: warm and dry with no masses, lesions, or rashes Psych: A&Ox3 but in clear distress secondary to pain    Results for orders placed or performed during the hospital encounter of 08/04/14 (from the past 48 hour(s))  CBC with Differential     Status: Abnormal   Collection Time: 08/04/14 12:56 PM  Result Value Ref Range   WBC 20.6 (H) 4.0 - 10.5 K/uL   RBC 4.19 3.87 - 5.11 MIL/uL   Hemoglobin 13.7 12.0 - 15.0 g/dL   HCT 42.0 36.0 - 46.0 %   MCV 100.2 (H) 78.0 - 100.0 fL   MCH 32.7 26.0 - 34.0 pg   MCHC 32.6 30.0 - 36.0 g/dL   RDW 13.5 11.5 - 15.5 %   Platelets 503 (H) 150 - 400 K/uL   Neutrophils Relative % 89 (H) 43 - 77 %   Neutro Abs 18.4 (H) 1.7 - 7.7 K/uL   Lymphocytes Relative 5 (L) 12 - 46 %   Lymphs Abs 1.0 0.7 - 4.0 K/uL   Monocytes Relative 6 3 - 12 %   Monocytes Absolute 1.1 (H) 0.1 - 1.0 K/uL   Eosinophils Relative 0 0 - 5 %   Eosinophils Absolute 0.0 0.0 - 0.7 K/uL   Basophils Relative 0 0 - 1 %   Basophils Absolute 0.0 0.0 - 0.1 K/uL  Comprehensive metabolic panel     Status: Abnormal   Collection Time: 08/04/14 12:56 PM  Result Value Ref Range   Sodium 134 (L) 135 - 145 mmol/L   Potassium 4.5 3.5 - 5.1 mmol/L   Chloride 97 (L) 101 - 111 mmol/L   CO2 22 22 - 32 mmol/L   Glucose, Bld 142 (H) 65 - 99 mg/dL   BUN 26 (H) 6 - 20 mg/dL   Creatinine, Ser 1.60 (H) 0.44 - 1.00 mg/dL   Calcium 9.1 8.9 - 10.3 mg/dL   Total Protein 6.6 6.5 - 8.1 g/dL   Albumin 3.0 (L) 3.5 - 5.0 g/dL   AST 30 15 - 41 U/L   ALT 28 14 - 54 U/L   Alkaline Phosphatase 82 38 - 126 U/L   Total Bilirubin 0.8 0.3 - 1.2 mg/dL   GFR calc non Af Amer 29 (L) >60 mL/min   GFR calc Af Amer 34 (L) >60 mL/min    Comment: (NOTE) The eGFR has been calculated using the CKD EPI equation. This calculation has not been validated in all clinical situations. eGFR's persistently <60 mL/min signify possible Chronic Kidney Disease.    Anion gap 15 5 - 15  Lipase, blood     Status: Abnormal    Collection Time: 08/04/14 12:56 PM  Result Value Ref Range   Lipase 92 (H)  22 - 51 U/L  Troponin I     Status: None   Collection Time: 08/04/14 12:56 PM  Result Value Ref Range   Troponin I <0.03 <0.031 ng/mL    Comment:        NO INDICATION OF MYOCARDIAL INJURY.   I-Stat CG4 Lactic Acid, ED     Status: Abnormal   Collection Time: 08/04/14  1:08 PM  Result Value Ref Range   Lactic Acid, Venous 5.42 (HH) 0.5 - 2.0 mmol/L   Comment NOTIFIED PHYSICIAN    Ct Abdomen Pelvis Wo Contrast  08/04/2014   CLINICAL DATA:  RIGHT upper quadrant abdominal pain radiating to shoulders, recent hospitalization for pneumonia, cholelithiasis versus sludge on ultrasound, altered mental status, tachypnea, hypotension, history CHF, GERD  EXAM: CT ABDOMEN AND PELVIS WITHOUT CONTRAST  TECHNIQUE: Multidetector CT imaging of the abdomen and pelvis was performed following the standard protocol without IV contrast. Sagittal and coronal MPR images reconstructed from axial data set. Oral contrast not administered.  COMPARISON:  None  FINDINGS: Exam quality degraded secondary to body habitus, inclusion of patient's arms in imaged field with streak artifacts, and a lack of IV and oral contrast.  Bibasilar atelectasis.  Large hiatal hernia.  Liver, spleen, atrophic pancreas, kidneys, and adrenal glands.  Distended gallbladder without calcification.  Prior ventral hernia repair with mesh.  Free intraperitoneal air and fluid compatible with perforated viscus.  Source of perforation is uncertain.  Diverticulosis of descending and sigmoid colon without wall thickening to suggest diverticulitis.  Questionable wall thickening of gastric antrum unable to exclude gastric tumor or ulcer disease.  Prominent stool at distal ascending through mid transverse colon with suspect prior resection of cecum and proximal ascending colon.  Small bowel loops grossly unremarkable wall thickness suboptimally assessed due to lack of IV contrast and  adjacent ascites.  Duodenum decompressed.  Scattered atherosclerotic calcifications.  Small LEFT inguinal hernia containing fat.  No mass or adenopathy.  Scoliosis with degenerative changes of the thoracolumbar spine.  IMPRESSION: Free intraperitoneal air and fluid compatible with perforated viscus of uncertain etiology though questionable wall thickening of the gastric antrum is identified.  Suspect prior resection of the cecum and ascending colon.  Distal colonic diverticulosis without gross evidence of diverticulitis.  Large hiatal hernia.  Distended gallbladder with suspected cholelithiasis by prior ultrasound.  Critical Value/emergent results were called by telephone at the time of interpretation on 08/04/2014 at 1429 hours to Dr. Carmin Muskrat , who verbally acknowledged these results.   Electronically Signed   By: Lavonia Dana M.D.   On: 08/04/2014 14:31   Dg Chest 2 View  08/02/2014   CLINICAL DATA:  Shortness of breath and abdominal pain today.  EXAM: CHEST  2 VIEW  COMPARISON:  07/18/2014  FINDINGS: Lungs are adequately inflated without consolidation or effusion. Mild linear atelectasis in the left perihilar region. Cardiomediastinal silhouette is within normal. There is a large hiatal hernia unchanged. There is mild calcified plaque over the aortic arch. Remainder the exam is unchanged.  IMPRESSION: No acute cardiopulmonary disease.   Electronically Signed   By: Marin Olp M.D.   On: 08/02/2014 21:59   Dg Chest Port 1 View  08/04/2014   CLINICAL DATA:  Hypotension, abdominal pain  EXAM: PORTABLE CHEST - 1 VIEW  COMPARISON:  08/02/2014  FINDINGS: Bilateral interstitial thickening. No pleural effusion or pneumothorax. No focal consolidation. Prominence of the central pulmonary vasculature. Stable cardiomediastinal silhouette. No acute osseous abnormality. Bilateral shoulder arthroplasties.  IMPRESSION: Findings concerning for  mild interstitial edema.   Electronically Signed   By: Kathreen Devoid   On:  08/04/2014 14:24   US Abdomen Limited Ruq  08/03/2014   CLINICAL DATA:  Acute onset of right upper quadrant abdominal pain. Initial encounter.  EXAM: US ABDOMEN LIMITED - RIGHT UPPER QUADRANT  COMPARISON:  None.  FINDINGS: Gallbladder:  A few small stones are noted layering dependently within the gallbladder, measuring up to 5 mm in size. Mild gallbladder wall thickening is noted. The patient has focal right upper quadrant tenderness, without definite ultrasonographic Murphy's sign. No pericholecystic fluid is seen.  Common bile duct:  Diameter: 1.0 cm, dilated in caliber.  Liver:  No focal lesion identified. Mildly increased parenchymal echogenicity and coarsened echotexture, likely reflecting fatty infiltration.  IMPRESSION: 1. Cholelithiasis, with mild apparent gallbladder wall thickening. Focal right upper quadrant tenderness, without definite ultrasonographic Murphy's sign. Common bile duct dilated to 1.0 cm in diameter. This could reflect some degree of obstruction due to a stone in the distal common bile duct. MRCP could be considered further evaluation. 2. Mild fatty infiltration within the liver.   Electronically Signed   By: Garald Balding M.D.   On: 08/03/2014 01:42       Assessment/Plan 1. Pneumoperitoneum secondary to perforated viscus -admit -will likely need CCM to see, but if does not require ventilation post op, will have medicine see her to manage her medical problems -zosyn, if she has a perforated ulcer, diflucan will need to be added -NPO -to OR emergently today for ex lap.  This procedure has been thoroughly explained to the patient as well as her husband.  Due to pain, I"m not sure the patient truly has a great understanding of everything that is going on.  I have d/w the husband the risks and complications, including but not limited to bleeding, infection, post op infection, bowel resection, bowel leak, colostomy, colon resection, open wound, need for ventilator, open abdomen  with further surgery, recurrent hernia, injury to organs we did not intend to injury.  He understands wants to proceed with surgery. 2. ARF -likely due to dehydration -IVFs and follow with labs 3. CHF -will ask medicine to consult 4. Recent respiratory failure with hypoxia due to PNA -cont O2, will have medicine assist 5. Leukocytosis -on zosyn, may need diflucan if proximal perforation 6. CBP -may be somewhat narcotic resistant.    Gleen Ripberger E 08/04/2014, 3:24 PM Pager: [phone]

## 2014-08-04 NOTE — Consult Note (Signed)
PULMONARY / CRITICAL CARE MEDICINE   Name: Monique Russo MRN: [id number] DOB: [birth year]    ADMISSION DATE:  08/04/2014 CONSULTATION DATE:  08/04/2014  REFERRING MD :  CCS  CHIEF COMPLAINT:  Vent management s/p ex lap for perforated ulcer  INITIAL PRESENTATION:  81 y.o. F brought to [facility] ED 5/17 for abdominal pain, found to have perforated viscus with pneumoperitoneum.  She was taken to the OR for ex-lap with repair of perforated ulcer and returned to the ICU on the vent.  PCCM was consulted for vent management.   STUDIES:  RUQ [place] 5/16 > Cholelithiasis focal RUQ tenderness, without definite ultrasonographic Murphy's sign. Common bile duct dilated to 1.0 cm in diameter. This could reflect some degree of obstruction  CXR 5/17 > interstitial edema CT A/P 5/17 > free intraperitoneal air and fluid c/w perforated viscus.  Large hiatal hernia, distended GB with suspected cholelithiasis.  SIGNIFICANT EVENTS: 5/15 - to ED for abdominal pain. [place] with gallstones 5/17 - admitted with perforated viscus, taken to OR for ex-lap / repair of perforated ulcer.  Back to ICU on vent.   HISTORY OF PRESENT ILLNESS:  Pt is encephalopathic; therefore, this HPI is obtained from chart review. Monique Russo is a 81 y.o. F with PMH as outlined below.  She was recently admitted to [facility] at the very end of April - May 2 for hyopxemic respiratory failure 2nd to PNA, CHF exacerbation. She was treated with levaquin and diuresis. Found to have LVEF 35% at that time. Also with some AKI which resolved. Discharged to home 5/2.   5/12 she presented to [facility] ED complaining of abdominal pain. She had an ultrasound of the RUQ which showed cholelithiasis with question but no definite cholelithiasis. She was discharged and referred for surgical follow up.  5/17 she returned to ED complaining again of abdominal pain and was found to have perforated viscus with pneumoperitoneum.  She was taken to the OR for ex-lap with repair of  perforated ulcer.  She then returned to the ICU on the vent and PCCM consulted for vent management..  Of note, she apparently has been on hospice last year; however, came off last September.   PAST MEDICAL HISTORY :   has a past medical history of Arthritis; CHF (congestive heart failure); Pneumonia; Depression; and GERD (gastroesophageal reflux disease).  has past surgical history that includes Abdominal hysterectomy; Joint replacement (Left); Reverse shoulder arthroplasty (Right, 12/04/2013); Reverse shoulder arthroplasty (12/04/2013); Back surgery (2000); Colon surgery; Hernia repair (2012); ORIF wrist fracture (2012); ERCP; Colonoscopy; Rotator cuff repair; Shoulder arthroscopy (2012); and Tendon repair (Right, 04/13/2014). Prior to Admission medications   Medication Sig Start Date End Date Taking? Authorizing Provider  acetaminophen (TYLENOL) 325 MG tablet Take 325 mg by mouth every 6 (six) hours as needed for mild pain or headache. 07/10/13  Yes Bobby Rumpf York, PA-C  Ascorbic Acid (VITAMIN C) 1000 MG tablet Take 1,000 mg by mouth daily.   Yes Historical Provider, MD  b complex vitamins tablet Take 1 tablet by mouth daily as needed.    Yes Historical Provider, MD  bisacodyl (DULCOLAX) 10 MG suppository Place 1 suppository (10 mg total) rectally daily as needed for moderate constipation (May repeat times one). 07/10/13  Yes Marianne L York, PA-C  budesonide (RHINOCORT AQUA) 32 MCG/ACT nasal spray Place 1 spray into both nostrils daily as needed for rhinitis.   Yes Historical Provider, MD  cholecalciferol (VITAMIN D) 1000 UNITS tablet Take 1,000 Units by mouth daily.  Yes Historical Provider, MD  diclofenac (VOLTAREN) 50 MG EC tablet Take 50 mg by mouth 2 (two) times daily.    Yes Historical Provider, MD  diclofenac sodium (VOLTAREN) 1 % GEL Apply topically every three (3) days as needed (For Leg pain). 1 gm   Yes Historical Provider, MD  docusate sodium (COLACE) 100 MG capsule Take 1 capsule (100  mg total) by mouth 3 (three) times daily as needed. 12/08/13  Yes Grier Mitts, PA-C  DULoxetine (CYMBALTA) 60 MG capsule Take 60 mg by mouth daily. At bedtime   Yes Historical Provider, MD  furosemide (LASIX) 80 MG tablet Take 80 mg by mouth daily as needed for fluid.    Yes Historical Provider, MD  gabapentin (NEURONTIN) 400 MG capsule Take 400 mg by mouth 2 (two) times daily.    Yes Historical Provider, MD  magnesium hydroxide (MILK OF MAGNESIA) 400 MG/5ML suspension Take 30 mLs by mouth every other day. As needed for constipation   Yes Historical Provider, MD  methadone (DOLOPHINE) 5 MG tablet Take 5 mg by mouth every 8 (eight) hours.    Yes Historical Provider, MD  methocarbamol (ROBAXIN) 750 MG tablet Take 750 mg by mouth every 8 (eight) hours as needed for muscle spasms (Prescribed TID but patient takes PRN).    Yes Historical Provider, MD  Omega-3 Fatty Acids (FISH OIL) 1200 MG CAPS Take 2 capsules by mouth daily.   Yes Historical Provider, MD  oxyCODONE-acetaminophen (PERCOCET) 10-325 MG per tablet Take 1 tablet by mouth every 8 (eight) hours as needed for pain.   Yes Historical Provider, MD  potassium chloride SA (K-DUR,KLOR-CON) 20 MEQ tablet Take 20 mEq by mouth daily as needed (Only takes with lasix).   Yes Historical Provider, MD  temazepam (RESTORIL) 30 MG capsule Take 1 capsule (30 mg total) by mouth at bedtime as needed for sleep. 07/10/13  Yes Marianne L York, PA-C  timolol (BETIMOL) 0.5 % ophthalmic solution Place 1 drop into both eyes daily.    Yes Historical Provider, MD  vitamin E 400 UNIT capsule Take 400 Units by mouth daily.    Yes Historical Provider, MD  oxyCODONE-acetaminophen (PERCOCET/ROXICET) 5-325 MG per tablet Take 1 tablet by mouth every 6 (six) hours as needed for severe pain. Patient not taking: Reported on 08/04/2014 08/03/14   Merryl Hacker, MD   No Known Allergies  FAMILY HISTORY:  Family History  Problem Relation Age of Onset  . Stroke Mother   .  Breast cancer Sister   . Ulcerative colitis Daughter   . Chronic fatigue Daughter   . Chronic fatigue Daughter     SOCIAL HISTORY:  reports that she has never smoked. She has never used smokeless tobacco. She reports that she drinks about 4.2 oz of alcohol per week. She reports that she does not use illicit drugs.  REVIEW OF SYSTEMS:  Unable to obtain a spt is encephalopathic.  SUBJECTIVE:   VITAL SIGNS: Temp:  [97.4 F (36.3 C)-100.4 F (38 C)] 100.4 F (38 C) (05/17 1303) Pulse Rate:  [100-113] 105 (05/17 1939) Resp:  [13-26] 15 (05/17 1939) BP: (69-137)/(37-71) 137/62 mmHg (05/17 1939) SpO2:  [93 %-100 %] 100 % (05/17 1939) FiO2 (%):  [100 %] 100 % (05/17 1939) HEMODYNAMICS:   VENTILATOR SETTINGS: Vent Mode:  [-] PRVC FiO2 (%):  [100 %] 100 % Set Rate:  [14 bmp] 14 bmp Vt Set:  [370 mL] 370 mL PEEP:  [5 cmH20] 5 cmH20 Plateau Pressure:  [19 cmH20]  19 cmH20 INTAKE / OUTPUT: Intake/Output      05/17 0701 - 05/18 0700   I.V. 1100   Total Intake 1100   Urine 150   Total Output 150   Net +950         PHYSICAL EXAMINATION: General: WDWN female, in NAD. Neuro: Sedated, opens eyes to voice. HEENT: [place]/AT. PERRL, sclerae anicteric. Cardiovascular: RRR, no M/R/G.  Lungs: Respirations even and unlabored.  CTA bilaterally, No W/R/R. Abdomen:  Midline dressing C/D/I. Soft, ND.  Appropriately tender. Musculoskeletal: No gross deformities, no edema.  Skin: Intact, warm, no rashes.  LABS:  CBC  Recent Labs Lab 08/02/14 2111 08/04/14 1256  WBC 10.0 20.6*  HGB 12.9 13.7  HCT 39.2 42.0  PLT 451* 503*   Coag's No results for input(s): APTT, INR in the last 168 hours. BMET  Recent Labs Lab 08/02/14 2111 08/04/14 1256  NA 133* 134*  K 4.1 4.5  CL 96* 97*  CO2 25 22  BUN 13 26*  CREATININE 0.82 1.60*  GLUCOSE 115* 142*   Electrolytes  Recent Labs Lab 08/02/14 2111 08/04/14 1256  CALCIUM 9.4 9.1   Sepsis Markers  Recent Labs Lab 08/04/14 1308   LATICACIDVEN 5.42*   ABG No results for input(s): PHART, PCO2ART, PO2ART in the last 168 hours. Liver Enzymes  Recent Labs Lab 08/02/14 2111 08/04/14 1256  AST 40 30  ALT 28 28  ALKPHOS 98 82  BILITOT 0.5 0.8  ALBUMIN 3.3* 3.0*   Cardiac Enzymes  Recent Labs Lab 08/04/14 1256  TROPONINI <0.03   Glucose No results for input(s): GLUCAP in the last 168 hours.  Imaging Ct Abdomen Pelvis Wo Contrast  08/04/2014   CLINICAL DATA:  RIGHT upper quadrant abdominal pain radiating to shoulders, recent hospitalization for pneumonia, cholelithiasis versus sludge on ultrasound, altered mental status, tachypnea, hypotension, history CHF, GERD  EXAM: CT ABDOMEN AND PELVIS WITHOUT CONTRAST  TECHNIQUE: Multidetector CT imaging of the abdomen and pelvis was performed following the standard protocol without IV contrast. Sagittal and coronal MPR images reconstructed from axial data set. Oral contrast not administered.  COMPARISON:  None  FINDINGS: Exam quality degraded secondary to body habitus, inclusion of patient's arms in imaged field with streak artifacts, and a lack of IV and oral contrast.  Bibasilar atelectasis.  Large hiatal hernia.  Liver, spleen, atrophic pancreas, kidneys, and adrenal glands.  Distended gallbladder without calcification.  Prior ventral hernia repair with mesh.  Free intraperitoneal air and fluid compatible with perforated viscus.  Source of perforation is uncertain.  Diverticulosis of descending and sigmoid colon without wall thickening to suggest diverticulitis.  Questionable wall thickening of gastric antrum unable to exclude gastric tumor or ulcer disease.  Prominent stool at distal ascending through mid transverse colon with suspect prior resection of cecum and proximal ascending colon.  Small bowel loops grossly unremarkable wall thickness suboptimally assessed due to lack of IV contrast and adjacent ascites.  Duodenum decompressed.  Scattered atherosclerotic calcifications.   Small LEFT inguinal hernia containing fat.  No mass or adenopathy.  Scoliosis with degenerative changes of the thoracolumbar spine.  IMPRESSION: Free intraperitoneal air and fluid compatible with perforated viscus of uncertain etiology though questionable wall thickening of the gastric antrum is identified.  Suspect prior resection of the cecum and ascending colon.  Distal colonic diverticulosis without gross evidence of diverticulitis.  Large hiatal hernia.  Distended gallbladder with suspected cholelithiasis by prior ultrasound.  Critical Value/emergent results were called by telephone at the time of interpretation  on 08/04/2014 at 1429 hours to Dr. Carmin Muskrat , who verbally acknowledged these results.   Electronically Signed   By: Lavonia Dana M.D.   On: 08/04/2014 14:31   Dg Chest 2 View  08/02/2014   CLINICAL DATA:  Shortness of breath and abdominal pain today.  EXAM: CHEST  2 VIEW  COMPARISON:  07/18/2014  FINDINGS: Lungs are adequately inflated without consolidation or effusion. Mild linear atelectasis in the left perihilar region. Cardiomediastinal silhouette is within normal. There is a large hiatal hernia unchanged. There is mild calcified plaque over the aortic arch. Remainder the exam is unchanged.  IMPRESSION: No acute cardiopulmonary disease.   Electronically Signed   By: Marin Olp M.D.   On: 08/02/2014 21:59   Dg Chest Port 1 View  08/04/2014   CLINICAL DATA:  Hypotension, abdominal pain  EXAM: PORTABLE CHEST - 1 VIEW  COMPARISON:  08/02/2014  FINDINGS: Bilateral interstitial thickening. No pleural effusion or pneumothorax. No focal consolidation. Prominence of the central pulmonary vasculature. Stable cardiomediastinal silhouette. No acute osseous abnormality. Bilateral shoulder arthroplasties.  IMPRESSION: Findings concerning for mild interstitial edema.   Electronically Signed   By: Kathreen Devoid   On: 08/04/2014 14:24   US Abdomen Limited Ruq  08/03/2014   CLINICAL DATA:  Acute  onset of right upper quadrant abdominal pain. Initial encounter.  EXAM: US ABDOMEN LIMITED - RIGHT UPPER QUADRANT  COMPARISON:  None.  FINDINGS: Gallbladder:  A few small stones are noted layering dependently within the gallbladder, measuring up to 5 mm in size. Mild gallbladder wall thickening is noted. The patient has focal right upper quadrant tenderness, without definite ultrasonographic Murphy's sign. No pericholecystic fluid is seen.  Common bile duct:  Diameter: 1.0 cm, dilated in caliber.  Liver:  No focal lesion identified. Mildly increased parenchymal echogenicity and coarsened echotexture, likely reflecting fatty infiltration.  IMPRESSION: 1. Cholelithiasis, with mild apparent gallbladder wall thickening. Focal right upper quadrant tenderness, without definite ultrasonographic Murphy's sign. Common bile duct dilated to 1.0 cm in diameter. This could reflect some degree of obstruction due to a stone in the distal common bile duct. MRCP could be considered further evaluation. 2. Mild fatty infiltration within the liver.   Electronically Signed   By: Garald Balding M.D.   On: 08/03/2014 01:42    ASSESSMENT / PLAN:  GASTROINTESTINAL A:   Perforated ulcer s/p repair 5/17 ([place]) Cholelithiasis as seen on RUQ ultrasound 5/16 GERD Nutrition P:   Post op care per [facility]CS. SUP: Pantoprazole. NPO. Nutrition per CCS.  PULMONARY OETT 5/17 >>>  A: VDRF following ex-lap for repair of perforated ulcer P:   Full mechanical support, wean as able. VAP bundle. Hopeful SBT and extubation in AM. CXR in AM.  CARDIOVASCULAR A:  Elevated lactate Hx dCHF - echo from April 2016 with EF 55-60% P:  Repeat lactate to assess for clearance. Hold outpatient furosemide, timolol.  RENAL A:   Hyponatremia AKI - likely pre-renal from decreased PO intake P:   BMP in AM.  HEMATOLOGIC A:   VTE Prophylaxis P:  SCD's / Lovenox. CBC in AM.  INFECTIOUS A:   Peritonitis from perforated ulcer P:    BCx2 5/17 > Anaerobic Cx 5/17 > Abx: Zosyn, start date 5/17, day 1/x.  ENDOCRINE A:   Hyperglycemia - no hx of DM   P:   SSI if glucose consistently > 180.  NEUROLOGIC A:   Acute metabolic encephalopathy Chronic pain with chronic opioid use Depression P:   Sedation:  Fentanyl PRN / Versed PRN. RASS goal: 0 to -1. Daily WUA. Hold outpatient duloxetine, gabapentin, methadone, methocarbamol, percocet, temazepam.   Family updated: Husband and pt's sister at bedside.  Interdisciplinary Family Meeting v Palliative Care Meeting:  Due by: 08/10/14.  CC time:  35 minutes.   Montey Hora, [place] [facility] Pager: [phone]  or [phone] 08/04/2014, 8:11 PM   Attending Note:  I have examined patient, reviewed labs, studies and notes. I have discussed the case with [place], and I agree with the data and plans as amended above.  Baltazar Apo, MD, PhD 08/05/2014, 1:07 PM [place] [facility] [phone] or if no answer [phone]

## 2014-08-04 NOTE — Anesthesia Postprocedure Evaluation (Signed)
  Anesthesia Post-op Note  Patient: Monique Russo  Procedure(s) Performed: Procedure(s): EXPLORATORY LAPAROTOMY, REPAIR OF PERFORATED ULCER (N/A)  Patient Location: ICU  Anesthesia Type:General  Level of Consciousness: Patient remains intubated per anesthesia plan  Airway and Oxygen Therapy: Patient remains intubated per anesthesia plan and Patient placed on Ventilator (see vital sign flow sheet for setting)  Post-op Pain: none  Post-op Assessment: Post-op Vital signs reviewed, No signs of Nausea or vomiting and Pain level controlled  Post-op Vital Signs: Reviewed and stable  Last Vitals:  Filed Vitals:   08/04/14 1939  BP: 137/62  Pulse: 105  Temp:   Resp: 15    Complications: No apparent anesthesia complications

## 2014-08-05 ENCOUNTER — Inpatient Hospital Stay (HOSPITAL_COMMUNITY): Payer: PPO

## 2014-08-05 ENCOUNTER — Encounter (HOSPITAL_COMMUNITY): Payer: Self-pay | Admitting: General Surgery

## 2014-08-05 DIAGNOSIS — Z01818 Encounter for other preprocedural examination: Secondary | ICD-10-CM | POA: Insufficient documentation

## 2014-08-05 DIAGNOSIS — K265 Chronic or unspecified duodenal ulcer with perforation: Secondary | ICD-10-CM

## 2014-08-05 DIAGNOSIS — Z9911 Dependence on respirator [ventilator] status: Secondary | ICD-10-CM

## 2014-08-05 LAB — BASIC METABOLIC PANEL
ANION GAP: 6 (ref 5–15)
ANION GAP: 10 (ref 5–15)
BUN: 23 mg/dL — ABNORMAL HIGH (ref 6–20)
BUN: 22 mg/dL — AB (ref 6–20)
CHLORIDE: 103 mmol/L (ref 101–111)
CO2: 22 mmol/L (ref 22–32)
CO2: 25 mmol/L (ref 22–32)
CREATININE: 1.11 mg/dL — AB (ref 0.44–1.00)
Calcium: 8.5 mg/dL — ABNORMAL LOW (ref 8.9–10.3)
Calcium: 8.6 mg/dL — ABNORMAL LOW (ref 8.9–10.3)
Chloride: 101 mmol/L (ref 101–111)
Creatinine, Ser: 1.08 mg/dL — ABNORMAL HIGH (ref 0.44–1.00)
GFR calc Af Amer: 54 mL/min — ABNORMAL LOW (ref >60)
GFR calc non Af Amer: 45 mL/min — ABNORMAL LOW (ref >60)
GFR calc non Af Amer: 47 mL/min — ABNORMAL LOW (ref >60)
GFR, EST AFRICAN AMERICAN: 53 mL/min — AB (ref >60)
GLUCOSE: 138 mg/dL — AB (ref 65–99)
Glucose, Bld: 121 mg/dL — ABNORMAL HIGH (ref 65–99)
POTASSIUM: 5.4 mmol/L — AB (ref 3.5–5.1)
Potassium: 5.5 mmol/L — ABNORMAL HIGH (ref 3.5–5.1)
SODIUM: 131 mmol/L — AB (ref 135–145)
SODIUM: 136 mmol/L (ref 135–145)

## 2014-08-05 LAB — BLOOD GAS, ARTERIAL
ACID-BASE DEFICIT: 1.7 mmol/L (ref 0.0–2.0)
Bicarbonate: 22 mEq/L (ref 20.0–24.0)
DRAWN BY: 308601
FIO2: 0.6 %
LHR: 14 {breaths}/min
MECHVT: 370 mL
O2 Saturation: 95.9 %
PEEP: 5 cmH2O
PO2 ART: 76.8 mmHg — AB (ref 80.0–100.0)
Patient temperature: 37
TCO2: 19.9 mmol/L (ref 0–100)
pCO2 arterial: 35.5 mmHg (ref 35.0–45.0)
pH, Arterial: 7.409 (ref 7.350–7.450)

## 2014-08-05 LAB — CBC
HCT: 38.7 % (ref 36.0–46.0)
HCT: 42.4 % (ref 36.0–46.0)
HEMOGLOBIN: 14.1 g/dL (ref 12.0–15.0)
Hemoglobin: 12.1 g/dL (ref 12.0–15.0)
MCH: 33.3 pg (ref 26.0–34.0)
MCH: 31.5 pg (ref 26.0–34.0)
MCHC: 31.3 g/dL (ref 30.0–36.0)
MCHC: 33.3 g/dL (ref 30.0–36.0)
MCV: 100.8 fL — ABNORMAL HIGH (ref 78.0–100.0)
MCV: 100 fL (ref 78.0–100.0)
PLATELETS: 434 10*3/uL — AB (ref 150–400)
RBC: 3.84 MIL/uL — ABNORMAL LOW (ref 3.87–5.11)
RBC: 4.24 MIL/uL (ref 3.87–5.11)
RDW: 13.8 % (ref 11.5–15.5)
RDW: 13.8 % (ref 11.5–15.5)
WBC: 21.6 10*3/uL — ABNORMAL HIGH (ref 4.0–10.5)
WBC: 27.5 10*3/uL — ABNORMAL HIGH (ref 4.0–10.5)

## 2014-08-05 LAB — LACTIC ACID, PLASMA: Lactic Acid, Venous: 3.9 mmol/L (ref 0.5–2.0)

## 2014-08-05 MED ORDER — ENOXAPARIN SODIUM 40 MG/0.4ML ~~LOC~~ SOLN
40.0000 mg | SUBCUTANEOUS | Status: DC
  Administered 2014-08-05 – 2014-08-12 (×8): 40 mg via SUBCUTANEOUS
  Filled 2014-08-05 (×8): qty 0.4

## 2014-08-05 MED ORDER — SODIUM CHLORIDE 0.9 % IJ SOLN: 9.0000 mL | INTRAMUSCULAR | Status: DC | PRN

## 2014-08-05 MED ORDER — DIPHENHYDRAMINE HCL 12.5 MG/5ML PO ELIX: 12.5000 mg | ORAL_SOLUTION | Freq: Four times a day (QID) | ORAL | Status: DC | PRN

## 2014-08-05 MED ORDER — DIPHENHYDRAMINE HCL 50 MG/ML IJ SOLN: 12.5000 mg | Freq: Four times a day (QID) | INTRAMUSCULAR | Status: DC | PRN

## 2014-08-05 MED ORDER — NALOXONE HCL 0.4 MG/ML IJ SOLN: 0.4000 mg | INTRAMUSCULAR | Status: DC | PRN

## 2014-08-05 MED ORDER — ONDANSETRON HCL 4 MG/2ML IJ SOLN: 4.0000 mg | Freq: Four times a day (QID) | INTRAMUSCULAR | Status: DC | PRN

## 2014-08-05 MED ORDER — HYDROMORPHONE 0.3 MG/ML IV SOLN
INTRAVENOUS | Status: DC
  Administered 2014-08-05: 5.2 mg via INTRAVENOUS
  Administered 2014-08-05: 3 mg via INTRAVENOUS
  Administered 2014-08-05 (×2): via INTRAVENOUS
  Administered 2014-08-06: 3.3 mg via INTRAVENOUS
  Administered 2014-08-06: 2.4 mg via INTRAVENOUS
  Administered 2014-08-06: 3 mg via INTRAVENOUS
  Administered 2014-08-06: 4.2 mg via INTRAVENOUS
  Administered 2014-08-06: 13:00:00 via INTRAVENOUS
  Administered 2014-08-06: 1.8 mg via INTRAVENOUS
  Administered 2014-08-06: 21:00:00 via INTRAVENOUS
  Administered 2014-08-06 (×2): 2.4 mg via INTRAVENOUS
  Administered 2014-08-06: 02:00:00 via INTRAVENOUS
  Filled 2014-08-05 (×5): qty 25

## 2014-08-05 MED ORDER — DEXTROSE-NACL 5-0.45 % IV SOLN
INTRAVENOUS | Status: DC
  Administered 2014-08-05 – 2014-08-07 (×3): via INTRAVENOUS

## 2014-08-05 NOTE — Progress Notes (Signed)
CRITICAL VALUE ALERT  Critical value received:  Lactic Acid 3.9    Date of notification:  08/04/14  Time of notification:  2202  Critical value read back:Yes.    Nurse who received alert:  Irene Pap, RN   MD notified (1st page):  Dr. Emmit Alexanders  Time of first page:  2208  MD notified (2nd page):  Time of second page:  Responding MD:  Dr. Emmit Alexanders  Time MD responded:  2208

## 2014-08-05 NOTE — Care Management Note (Signed)
Case Management Note  Patient Details  Name: Monique Russo MRN: [id number] Date of Birth: Sep 20, 1930  Subjective/Objective:                 Perforated ulcer repair and on vent post op   Action/Plan: Home when stable  Expected Discharge Date:   Verita Schneiders)       [id number]        Expected Discharge Plan:  Home/Self Care  In-House Referral:  NA  Discharge planning Services  CM Consult  Post Acute Care Choice:  NA Choice offered to:  NA  DME Arranged:    DME Agency:     HH Arranged:    HH Agency:     Status of Service:  In process, will continue to follow  Medicare Important Message Given:    Date Medicare IM Given:    Medicare IM give by:    Date Additional Medicare IM Given:    Additional Medicare Important Message give by:     If discussed at [place] of Stay Meetings, dates discussed:    Additional Comments:  Leeroy Cha, RN 08/05/2014, 8:38 AM

## 2014-08-05 NOTE — Progress Notes (Signed)
Patient ID: Monique Russo, female   DOB: May 05, 1930, 81 y.o.   MRN: [id number] 1 Day Post-Op  Subjective: Alert and communicative on the vent. Able to indicate she has pain in her incision but her overall abdominal pain is better. No subjective shortness of breath.  Objective: Vital signs in last 24 hours: Temp:  [97.4 F (36.3 C)-100.7 F (38.2 C)] 100.7 F (38.2 C) (05/18 0800) Pulse Rate:  [94-113] 97 (05/18 0600) Resp:  [12-26] 16 (05/18 0600) BP: (69-146)/(37-71) 119/45 mmHg (05/18 0600) SpO2:  [93 %-100 %] 100 % (05/18 0600) FiO2 (%):  [40 %-100 %] 40 % (05/18 0428) Weight:  [89.3 kg (196 lb 13.9 oz)] 89.3 kg (196 lb 13.9 oz) (05/17 2000)    Intake/Output from previous day: 05/17 0701 - 05/18 0700 In: 2136.7 [I.V.:2036.7; IV Piggyback:100] Out: 790 [Urine:790] Intake/Output this shift:    General appearance: alert, cooperative and no distress Resp: clear to auscultation bilaterally GI: moderate appropriate abdominal tenderness. Dressing clean and dry.   Lab Results:   Recent Labs  08/05/14 0025 08/05/14 0340  WBC 27.5* 21.6*  HGB 14.1 12.1  HCT 42.4 38.7  PLT PLATELET CLUMPS NOTED ON SMEAR, COUNT APPEARS INCREASED 434*   BMET  Recent Labs  08/05/14 0025 08/05/14 0340  NA 131* 136  K 5.4* 5.5*  CL 103 101  CO2 22 25  GLUCOSE 121* 138*  BUN 23* 22*  CREATININE 1.11* 1.08*  CALCIUM 8.5* 8.6*     Studies/Results: Ct Abdomen Pelvis Wo Contrast  08/04/2014   CLINICAL DATA:  RIGHT upper quadrant abdominal pain radiating to shoulders, recent hospitalization for pneumonia, cholelithiasis versus sludge on ultrasound, altered mental status, tachypnea, hypotension, history CHF, GERD  EXAM: CT ABDOMEN AND PELVIS WITHOUT CONTRAST  TECHNIQUE: Multidetector CT imaging of the abdomen and pelvis was performed following the standard protocol without IV contrast. Sagittal and coronal MPR images reconstructed from axial data set. Oral contrast not administered.   COMPARISON:  None  FINDINGS: Exam quality degraded secondary to body habitus, inclusion of patient's arms in imaged field with streak artifacts, and a lack of IV and oral contrast.  Bibasilar atelectasis.  Large hiatal hernia.  Liver, spleen, atrophic pancreas, kidneys, and adrenal glands.  Distended gallbladder without calcification.  Prior ventral hernia repair with mesh.  Free intraperitoneal air and fluid compatible with perforated viscus.  Source of perforation is uncertain.  Diverticulosis of descending and sigmoid colon without wall thickening to suggest diverticulitis.  Questionable wall thickening of gastric antrum unable to exclude gastric tumor or ulcer disease.  Prominent stool at distal ascending through mid transverse colon with suspect prior resection of cecum and proximal ascending colon.  Small bowel loops grossly unremarkable wall thickness suboptimally assessed due to lack of IV contrast and adjacent ascites.  Duodenum decompressed.  Scattered atherosclerotic calcifications.  Small LEFT inguinal hernia containing fat.  No mass or adenopathy.  Scoliosis with degenerative changes of the thoracolumbar spine.  IMPRESSION: Free intraperitoneal air and fluid compatible with perforated viscus of uncertain etiology though questionable wall thickening of the gastric antrum is identified.  Suspect prior resection of the cecum and ascending colon.  Distal colonic diverticulosis without gross evidence of diverticulitis.  Large hiatal hernia.  Distended gallbladder with suspected cholelithiasis by prior ultrasound.  Critical Value/emergent results were called by telephone at the time of interpretation on 08/04/2014 at 1429 hours to Dr. Carmin Muskrat , who verbally acknowledged these results.   Electronically Signed   By: Crist Infante.D.  On: 08/04/2014 14:31   Portable Chest Xray  08/05/2014   CLINICAL DATA:  Intubation.  EXAM: PORTABLE CHEST - 1 VIEW  COMPARISON:  08/04/2014.  FINDINGS: Endotracheal  tube in stable position. Persistent right lower lobe infiltrate consistent pneumonia. No pleural effusion or pneumothorax. Heart size stable. Bilateral shoulder replacements.  IMPRESSION: 1. Endotracheal tube in stable position. 2. Persistent right lower lobe infiltrate consistent with pneumonia.   Electronically Signed   By: Marcello Moores  Register   On: 08/05/2014 07:00   Dg Chest Port 1 View  08/04/2014   CLINICAL DATA:  Ventilator dependence post abdominal surgery, history CHF, pneumonia, GERD  EXAM: PORTABLE CHEST - 1 VIEW  COMPARISON:  Portable exam 2017 hours compared to 08/04/2014 at 1351 hours  FINDINGS: Tip of endotracheal tube projects 4.3 cm above carina.  Rotated to the RIGHT.  Borderline cardiac enlargement.  Atherosclerotic calcification aorta.  Increased perihilar markings which could represent minimal edema similar to previous exam.  However increased opacity is seen at the RIGHT lung base versus the previous study, cannot exclude superimposed consolidation.  No gross pleural effusion or pneumothorax.  BILATERAL shoulder prostheses.  IMPRESSION: Question minimal perihilar edema with increased RIGHT basilar opacity raising question of superimposed consolidation.   Electronically Signed   By: Lavonia Dana M.D.   On: 08/04/2014 21:33   Dg Chest Port 1 View  08/04/2014   CLINICAL DATA:  Hypotension, abdominal pain  EXAM: PORTABLE CHEST - 1 VIEW  COMPARISON:  08/02/2014  FINDINGS: Bilateral interstitial thickening. No pleural effusion or pneumothorax. No focal consolidation. Prominence of the central pulmonary vasculature. Stable cardiomediastinal silhouette. No acute osseous abnormality. Bilateral shoulder arthroplasties.  IMPRESSION: Findings concerning for mild interstitial edema.   Electronically Signed   By: Kathreen Devoid   On: 08/04/2014 14:24    Anti-infectives: Anti-infectives    Start     Dose/Rate Route Frequency Ordered Stop   08/05/14 0600  vancomycin (VANCOCIN) IVPB 1000 mg/200 mL premix   Status:  Discontinued     1,000 mg 200 mL/hr over 60 Minutes Intravenous Every 24 hours 08/04/14 1417 08/04/14 1517   08/04/14 2000  piperacillin-tazobactam (ZOSYN) IVPB 3.375 g     3.375 g 12.5 mL/hr over 240 Minutes Intravenous Every 8 hours 08/04/14 1344     08/04/14 1330  piperacillin-tazobactam (ZOSYN) IVPB 3.375 g     3.375 g 100 mL/hr over 30 Minutes Intravenous  Once 08/04/14 1326 08/04/14 1443   08/04/14 1330  vancomycin (VANCOCIN) IVPB 1000 mg/200 mL premix     1,000 mg 200 mL/hr over 60 Minutes Intravenous  Once 08/04/14 1326 08/04/14 1442      Assessment/Plan: s/p Procedure(s): EXPLORATORY LAPAROTOMY, REPAIR OF PERFORATED ULCER Overall stable. Postoperative vent support but it appears she likely could be weaned today. Continue antibiotics, bowel rest and start dressing changes.   LOS: 1 day    Rutledge Selsor T 08/05/2014

## 2014-08-05 NOTE — Progress Notes (Signed)
Patient placed on 100% O2, suctioned for scant clear secretions, cuff deflated, and extubated to a 3lpm [place] humidified. Able to verbalize well. HR96. O2 sat 100%, RR 18, BP 127/50. Will continue to monitor.

## 2014-08-05 NOTE — Progress Notes (Signed)
[facility]  Patient Status: Active (receiving services up to time of hospitalization)  [facility] is providing the following services: RN  If patient discharges after hours, please call [phone].   Monique Russo 08/05/2014, 11:45 AM

## 2014-08-05 NOTE — Progress Notes (Signed)
PULMONARY / CRITICAL CARE MEDICINE   Name: Monique Russo MRN: [id number] DOB: 1930-08-28    ADMISSION DATE:  08/04/2014 CONSULTATION DATE:  08/05/2014  REFERRING MD :  CCS  CHIEF COMPLAINT:  Vent management s/p ex lap for perforated ulcer  INITIAL PRESENTATION:  81 y.o. F brought to [facility] ED 5/17 for abdominal pain, found to have perforated viscus with pneumoperitoneum.  She was taken to the OR for ex-lap with repair of perforated ulcer and returned to the ICU on the vent.  PCCM was consulted for vent management.   STUDIES:  RUQ [place] 5/16 > Cholelithiasis focal RUQ tenderness, without definite ultrasonographic Murphy's sign. Common bile duct dilated to 1.0 cm in diameter. This could reflect some degree of obstruction  CXR 5/17 > interstitial edema CT A/P 5/17 > free intraperitoneal air and fluid c/w perforated viscus.  Large hiatal hernia, distended GB with suspected cholelithiasis.  SIGNIFICANT EVENTS: 5/15 - to ED for abdominal pain. [place] with gallstones 5/17 - admitted with perforated viscus, taken to OR for ex-lap / repair of perforated ulcer.  Back to ICU on vent. 5/18 extubated; started TNA  VITAL SIGNS: Temp:  [97.4 F (36.3 C)-100.7 F (38.2 C)] 100.7 F (38.2 C) (05/18 0800) Pulse Rate:  [94-113] 95 (05/18 1000) Resp:  [12-26] 14 (05/18 1100) BP: (69-146)/(37-71) 127/50 mmHg (05/18 1100) SpO2:  [93 %-100 %] 100 % (05/18 1000) FiO2 (%):  [40 %-100 %] 40 % (05/18 0915) Weight:  [89.3 kg (196 lb 13.9 oz)] 89.3 kg (196 lb 13.9 oz) (05/17 2000) HEMODYNAMICS:   VENTILATOR SETTINGS: Vent Mode:  [-] PSV;CPAP FiO2 (%):  [40 %-100 %] 40 % Set Rate:  [14 bmp] 14 bmp Vt Set:  [370 mL] 370 mL PEEP:  [5 cmH20] 5 cmH20 Pressure Support:  [5 cmH20] 5 cmH20 Plateau Pressure:  [13 cmH20-19 cmH20] 13 cmH20 INTAKE / OUTPUT: Intake/Output      05/17 0701 - 05/18 0700 05/18 0701 - 05/19 0700   I.V. (mL/kg) 2036.7 (22.8) 253.3 (2.8)   IV Piggyback 100    Total Intake(mL/kg) 2136.7  (23.9) 253.3 (2.8)   Urine (mL/kg/hr) 790 50 (0.1)   Total Output 790 50   Net +1346.7 +203.3          PHYSICAL EXAMINATION: General: WDWN female, in NAD. Neuro: Sedated, opens eyes to voice. HEENT: [place]/AT. PERRL, sclerae anicteric. Cardiovascular: RRR, no M/R/G.  Lungs: Respirations even and unlabored.  CTA bilaterally, good Vt on PSV prior to extubation . Abdomen:  Midline dressing C/D/I. Soft, ND.  Appropriately tender. Musculoskeletal: No gross deformities, no edema.  Skin: Intact, warm, no rashes.  LABS: CBC  Recent Labs Lab 08/04/14 1256 08/05/14 0025 08/05/14 0340  WBC 20.6* 27.5* 21.6*  HGB 13.7 14.1 12.1  HCT 42.0 42.4 38.7  PLT 503* PLATELET CLUMPS NOTED ON SMEAR, COUNT APPEARS INCREASED 434*   Coag's No results for input(s): APTT, INR in the last 168 hours. BMET  Recent Labs Lab 08/04/14 1256 08/04/14 2110 08/05/14 0025 08/05/14 0340  NA 134*  --  131* 136  K 4.5  --  5.4* 5.5*  CL 97*  --  103 101  CO2 22  --  22 25  BUN 26*  --  23* 22*  CREATININE 1.60* 1.19* 1.11* 1.08*  GLUCOSE 142*  --  121* 138*   Electrolytes  Recent Labs Lab 08/04/14 1256 08/05/14 0025 08/05/14 0340  CALCIUM 9.1 8.5* 8.6*   Sepsis Markers  Recent Labs Lab 08/04/14 1308 08/04/14 2110  LATICACIDVEN 5.42* 3.9*   ABG  Recent Labs Lab 08/05/14 0418  PHART 7.409  PCO2ART 35.5  PO2ART 76.8*   Liver Enzymes  Recent Labs Lab 08/02/14 2111 08/04/14 1256  AST 40 30  ALT 28 28  ALKPHOS 98 82  BILITOT 0.5 0.8  ALBUMIN 3.3* 3.0*   Cardiac Enzymes  Recent Labs Lab 08/04/14 1256  TROPONINI <0.03   Glucose No results for input(s): GLUCAP in the last 168 hours.  Imaging Ct Abdomen Pelvis Wo Contrast  08/04/2014   CLINICAL DATA:  RIGHT upper quadrant abdominal pain radiating to shoulders, recent hospitalization for pneumonia, cholelithiasis versus sludge on ultrasound, altered mental status, tachypnea, hypotension, history CHF, GERD  EXAM: CT ABDOMEN  AND PELVIS WITHOUT CONTRAST  TECHNIQUE: Multidetector CT imaging of the abdomen and pelvis was performed following the standard protocol without IV contrast. Sagittal and coronal MPR images reconstructed from axial data set. Oral contrast not administered.  COMPARISON:  None  FINDINGS: Exam quality degraded secondary to body habitus, inclusion of patient's arms in imaged field with streak artifacts, and a lack of IV and oral contrast.  Bibasilar atelectasis.  Large hiatal hernia.  Liver, spleen, atrophic pancreas, kidneys, and adrenal glands.  Distended gallbladder without calcification.  Prior ventral hernia repair with mesh.  Free intraperitoneal air and fluid compatible with perforated viscus.  Source of perforation is uncertain.  Diverticulosis of descending and sigmoid colon without wall thickening to suggest diverticulitis.  Questionable wall thickening of gastric antrum unable to exclude gastric tumor or ulcer disease.  Prominent stool at distal ascending through mid transverse colon with suspect prior resection of cecum and proximal ascending colon.  Small bowel loops grossly unremarkable wall thickness suboptimally assessed due to lack of IV contrast and adjacent ascites.  Duodenum decompressed.  Scattered atherosclerotic calcifications.  Small LEFT inguinal hernia containing fat.  No mass or adenopathy.  Scoliosis with degenerative changes of the thoracolumbar spine.  IMPRESSION: Free intraperitoneal air and fluid compatible with perforated viscus of uncertain etiology though questionable wall thickening of the gastric antrum is identified.  Suspect prior resection of the cecum and ascending colon.  Distal colonic diverticulosis without gross evidence of diverticulitis.  Large hiatal hernia.  Distended gallbladder with suspected cholelithiasis by prior ultrasound.  Critical Value/emergent results were called by telephone at the time of interpretation on 08/04/2014 at 1429 hours to Dr. Carmin Muskrat , who  verbally acknowledged these results.   Electronically Signed   By: Lavonia Dana M.D.   On: 08/04/2014 14:31   Portable Chest Xray  08/05/2014   CLINICAL DATA:  Intubation.  EXAM: PORTABLE CHEST - 1 VIEW  COMPARISON:  08/04/2014.  FINDINGS: Endotracheal tube in stable position. Persistent right lower lobe infiltrate consistent pneumonia. No pleural effusion or pneumothorax. Heart size stable. Bilateral shoulder replacements.  IMPRESSION: 1. Endotracheal tube in stable position. 2. Persistent right lower lobe infiltrate consistent with pneumonia.   Electronically Signed   By: Marcello Moores  Register   On: 08/05/2014 07:00   Dg Chest Port 1 View  08/04/2014   CLINICAL DATA:  Ventilator dependence post abdominal surgery, history CHF, pneumonia, GERD  EXAM: PORTABLE CHEST - 1 VIEW  COMPARISON:  Portable exam 2017 hours compared to 08/04/2014 at 1351 hours  FINDINGS: Tip of endotracheal tube projects 4.3 cm above carina.  Rotated to the RIGHT.  Borderline cardiac enlargement.  Atherosclerotic calcification aorta.  Increased perihilar markings which could represent minimal edema similar to previous exam.  However increased opacity is seen at the  RIGHT lung base versus the previous study, cannot exclude superimposed consolidation.  No gross pleural effusion or pneumothorax.  BILATERAL shoulder prostheses.  IMPRESSION: Question minimal perihilar edema with increased RIGHT basilar opacity raising question of superimposed consolidation.   Electronically Signed   By: Lavonia Dana M.D.   On: 08/04/2014 21:33   Dg Chest Port 1 View  08/04/2014   CLINICAL DATA:  Hypotension, abdominal pain  EXAM: PORTABLE CHEST - 1 VIEW  COMPARISON:  08/02/2014  FINDINGS: Bilateral interstitial thickening. No pleural effusion or pneumothorax. No focal consolidation. Prominence of the central pulmonary vasculature. Stable cardiomediastinal silhouette. No acute osseous abnormality. Bilateral shoulder arthroplasties.  IMPRESSION: Findings concerning  for mild interstitial edema.   Electronically Signed   By: Kathreen Devoid   On: 08/04/2014 14:24    ASSESSMENT / PLAN:  GASTROINTESTINAL A:   Perforated ulcer s/p repair 5/17 ([place]) Cholelithiasis as seen on RUQ ultrasound 5/16 GERD Nutrition P:   Post op care per CCS. SUP: Pantoprazole. NPO. Nutrition per CCS.  PULMONARY OETT 5/17 >>> 5/18 A: VDRF following ex-lap for repair of perforated ulcer Extubated 5/18 RLL atx P:   Wean O2 Mobilize  Careful w/ narcs  CARDIOVASCULAR A:  Elevated lactate-->cleared well, now hemodynamically stable  Hx dCHF - echo from April 2016 with EF 55-60% P:  Hold lasix Cont IVFs  RENAL A:   Hyponatremia AKI - likely pre-renal from decreased PO intake, improved  Hyperkalemia  P:   Dc'd KCL  BMP in AM. Cont IVFs  HEMATOLOGIC A:   VTE Prophylaxis P:  SCD's / Lovenox. CBC in AM.  INFECTIOUS A:   Peritonitis from perforated ulcer P:   BCx2 5/17 > Anaerobic Cx 5/17 > Abx: Zosyn, start date 5/17, day 2/x.  Broaden and include antifungals if she decompensates.   ENDOCRINE A:   Hyperglycemia - no hx of DM   P:   SSI if glucose consistently > 180.  NEUROLOGIC A:   Acute metabolic encephalopathy-->resolved Chronic pain with chronic opioid use Depression P:   Start PCA w/ cont CO2 following; will use dilaudid given her hx of high narcotic requirements.  Hold outpatient duloxetine, gabapentin, methadone, methocarbamol, percocet, temazepam.   Family updated: Husband and pt's sister at bedside.  Interdisciplinary Family Meeting v Palliative Care Meeting:  Due by: 08/10/14.  Looks good. Extubate today. Cont current abx, bowel rest and pain management. Will defer diet to surg. PCCM will s/o.   Erick Colace ACNP-BC [place] Pager # [phone] OR # [phone] if no answer   Attending Note:  I have examined patient, reviewed labs, studies and notes. I have discussed the case with Jerrye Bushy, and I  agree with the data and plans as amended above. Pt is POD#1 s/p repair perforated GU and peritonitis. Stable on broad spectrum abx. Tolerating SBT this am. No barriers noted to extubation, will proceed this am. Independent critical care time is 45 minutes.   Baltazar Apo, MD, PhD 08/05/2014, 1:08 PM [facility] [phone] or if no answer [phone]

## 2014-08-06 ENCOUNTER — Inpatient Hospital Stay (HOSPITAL_COMMUNITY): Payer: PPO

## 2014-08-06 LAB — HEPATIC FUNCTION PANEL
ALT: 16 U/L (ref 14–54)
AST: 18 U/L (ref 15–41)
Albumin: 2.3 g/dL — ABNORMAL LOW (ref 3.5–5.0)
Alkaline Phosphatase: 87 U/L (ref 38–126)
Bilirubin, Direct: 0.1 mg/dL (ref 0.1–0.5)
Indirect Bilirubin: 0.4 mg/dL (ref 0.3–0.9)
Total Bilirubin: 0.5 mg/dL (ref 0.3–1.2)
Total Protein: 6.4 g/dL — ABNORMAL LOW (ref 6.5–8.1)

## 2014-08-06 LAB — BASIC METABOLIC PANEL
ANION GAP: 3 — AB (ref 5–15)
ANION GAP: 8 (ref 5–15)
BUN: 18 mg/dL (ref 6–20)
BUN: 11 mg/dL (ref 6–20)
CALCIUM: 8.4 mg/dL — AB (ref 8.9–10.3)
CHLORIDE: 105 mmol/L (ref 101–111)
CHLORIDE: 103 mmol/L (ref 101–111)
CO2: 25 mmol/L (ref 22–32)
CO2: 23 mmol/L (ref 22–32)
Calcium: 8.9 mg/dL (ref 8.9–10.3)
Creatinine, Ser: 0.87 mg/dL (ref 0.44–1.00)
Creatinine, Ser: 0.68 mg/dL (ref 0.44–1.00)
GFR calc Af Amer: >60 mL/min (ref >60)
GFR calc non Af Amer: >60 mL/min (ref >60)
GFR calc non Af Amer: >60 mL/min (ref >60)
GLUCOSE: 118 mg/dL — AB (ref 65–99)
Glucose, Bld: 125 mg/dL — ABNORMAL HIGH (ref 65–99)
POTASSIUM: 4.4 mmol/L (ref 3.5–5.1)
Potassium: 4.4 mmol/L (ref 3.5–5.1)
SODIUM: 134 mmol/L — AB (ref 135–145)
Sodium: 133 mmol/L — ABNORMAL LOW (ref 135–145)

## 2014-08-06 LAB — CBC WITH DIFFERENTIAL/PLATELET
Basophils Absolute: 0 10*3/uL (ref 0.0–0.1)
Basophils Relative: 0 % (ref 0–1)
Eosinophils Absolute: 0.2 10*3/uL (ref 0.0–0.7)
Eosinophils Relative: 2 % (ref 0–5)
HEMATOCRIT: 31.4 % — AB (ref 36.0–46.0)
Hemoglobin: 10.1 g/dL — ABNORMAL LOW (ref 12.0–15.0)
Lymphocytes Relative: 9 % — ABNORMAL LOW (ref 12–46)
Lymphs Abs: 1.1 10*3/uL (ref 0.7–4.0)
MCH: 32.4 pg (ref 26.0–34.0)
MCHC: 32.2 g/dL (ref 30.0–36.0)
MCV: 100.6 fL — ABNORMAL HIGH (ref 78.0–100.0)
Monocytes Absolute: 1.2 10*3/uL — ABNORMAL HIGH (ref 0.1–1.0)
Monocytes Relative: 9 % (ref 3–12)
NEUTROS ABS: 10.2 10*3/uL — AB (ref 1.7–7.7)
NEUTROS PCT: 80 % — AB (ref 43–77)
Platelets: 374 10*3/uL (ref 150–400)
RBC: 3.12 MIL/uL — ABNORMAL LOW (ref 3.87–5.11)
RDW: 13.9 % (ref 11.5–15.5)
WBC: 12.7 10*3/uL — ABNORMAL HIGH (ref 4.0–10.5)

## 2014-08-06 LAB — LIPASE, BLOOD: LIPASE: 12 U/L — AB (ref 22–51)

## 2014-08-06 LAB — LACTIC ACID, PLASMA: Lactic Acid, Venous: 1.3 mmol/L (ref 0.5–2.0)

## 2014-08-06 LAB — CBC
HCT: 29.7 % — ABNORMAL LOW (ref 36.0–46.0)
Hemoglobin: 9.6 g/dL — ABNORMAL LOW (ref 12.0–15.0)
MCH: 32.5 pg (ref 26.0–34.0)
MCHC: 32.3 g/dL (ref 30.0–36.0)
MCV: 100.7 fL — ABNORMAL HIGH (ref 78.0–100.0)
Platelets: 379 10*3/uL (ref 150–400)
RBC: 2.95 MIL/uL — AB (ref 3.87–5.11)
RDW: 14.1 % (ref 11.5–15.5)
WBC: 15.5 10*3/uL — AB (ref 4.0–10.5)

## 2014-08-06 LAB — TROPONIN I: TROPONIN I: 0.04 ng/mL — AB (ref <0.031)

## 2014-08-06 MED ORDER — HYDROMORPHONE HCL 1 MG/ML IJ SOLN
1.0000 mg | INTRAMUSCULAR | Status: DC | PRN
  Administered 2014-08-06: 2 mg via INTRAVENOUS
  Administered 2014-08-07: 1 mg via INTRAVENOUS
  Administered 2014-08-07: 2 mg via INTRAVENOUS
  Administered 2014-08-07: 1 mg via INTRAVENOUS
  Administered 2014-08-07 (×7): 2 mg via INTRAVENOUS
  Administered 2014-08-07: 1 mg via INTRAVENOUS
  Administered 2014-08-07 – 2014-08-11 (×9): 2 mg via INTRAVENOUS
  Filled 2014-08-06 (×9): qty 2
  Filled 2014-08-06: qty 1
  Filled 2014-08-06 (×11): qty 2

## 2014-08-06 MED ORDER — CHLORHEXIDINE GLUCONATE CLOTH 2 % EX PADS
6.0000 | MEDICATED_PAD | Freq: Every day | CUTANEOUS | Status: DC
  Administered 2014-08-06: 6 via TOPICAL

## 2014-08-06 MED ORDER — MUPIROCIN 2 % EX OINT
1.0000 "application " | TOPICAL_OINTMENT | Freq: Two times a day (BID) | CUTANEOUS | Status: AC
  Administered 2014-08-06 – 2014-08-10 (×10): 1 via NASAL
  Filled 2014-08-06 (×3): qty 22

## 2014-08-06 NOTE — Progress Notes (Addendum)
eLink Physician-Brief Progress Note Patient Name: Monique Russo DOB: 05/14/1930 MRN: [id number]   Date of Service  08/06/2014  HPI/Events of Note  Severe abd apain despite dilaudid pca  Camera exam Appears to be in severe pain Very tender on RN exam  eICU Interventions  Check LFT, lipase, CK, CBC, BMET, Lactate, KUB  RN instructed to infiorm CCS     Intervention Category Intermediate Interventions: Abdominal pain - evaluation and management  Ethelean Colla 08/06/2014, 6:31 PM

## 2014-08-06 NOTE — Progress Notes (Signed)
eLink Physician-Brief Progress Note Patient Name: Monique Russo DOB: 07-26-1930 MRN: [id number]   Date of Service  08/06/2014  HPI/Events of Note  Patient back in pain per RN  RN says hom cns meds - tylenol 325mg  q6h -voltarin XR 50mg  bid - voltarin gel q72h - neurontin 400mg  bid - methadone 5mg  q8h (baseline MEDD is 150mg  per day per Rennis Petty 2008 J pall med) - robaxin 750mg  q8h - percocet 10/325 q8h prn - restoril 30mg  qhs  - cymbalta 60mg  daily  eICU Interventions  Currently in severe pain  Partly this is post op incision + lack of baseline CNS meds  Currently not doing her PCA dilaudid enough - dosing does not even match baseline methadone need  On top she has post op pain  Plan RN to checkw with CCS about po Move to RN directed dilaudid minium 2mg  Q2h pn     Intervention Category Major Interventions: Other:  Aman Bonet 08/06/2014, 10:35 PM

## 2014-08-06 NOTE — Progress Notes (Signed)
PCCM Interval Note  Pt examined. She is stable post-extubation.  No new issues.   Please call us if we can help you.  Baltazar Apo, MD, PhD 08/06/2014, 10:03 AM [facility] [phone] or if no answer [phone]

## 2014-08-06 NOTE — Progress Notes (Signed)
Patient ID: Monique Russo, female   DOB: 1931-01-24, 81 y.o.   MRN: [id number] 2 Days Post-Op  Subjective: Complaining of abdominal pain but says it is much better than before surgery. No shortness of breath post extubation  Objective: Vital signs in last 24 hours: Temp:  [98.9 F (37.2 C)-99.6 F (37.6 C)] 99.2 F (37.3 C) (05/19 0400) Pulse Rate:  [94-103] 94 (05/18 1800) Resp:  [9-28] 21 (05/19 0800) BP: (90-143)/(40-83) 135/57 mmHg (05/19 0800) SpO2:  [96 %-100 %] 99 % (05/19 0800) FiO2 (%):  [40 %] 40 % (05/18 0915) Weight:  [89.4 kg (197 lb 1.5 oz)] 89.4 kg (197 lb 1.5 oz) (05/19 0400)    Intake/Output from previous day: 05/18 0701 - 05/19 0700 In: 2103.3 [I.V.:1953.3; IV Piggyback:150] Out: 985 [Urine:985] Intake/Output this shift: Total I/O In: 100 [I.V.:100] Out: 115 [Urine:115]  General appearance: alert, cooperative, mild distress and morbidly obese GI: abnormal findings:  moderate tenderness in the upper abdomen Incision/Wound: wound packed open, clean without unusual drainage  Lab Results:   Recent Labs  08/05/14 0340 08/06/14 0345  WBC 21.6* 15.5*  HGB 12.1 9.6*  HCT 38.7 29.7*  PLT 434* 379   BMET  Recent Labs  08/05/14 0340 08/06/14 0345  NA 136 133*  K 5.5* 4.4  CL 101 105  CO2 25 25  GLUCOSE 138* 118*  BUN 22* 18  CREATININE 1.08* 0.87  CALCIUM 8.6* 8.4*     Studies/Results: Ct Abdomen Pelvis Wo Contrast  08/04/2014   CLINICAL DATA:  RIGHT upper quadrant abdominal pain radiating to shoulders, recent hospitalization for pneumonia, cholelithiasis versus sludge on ultrasound, altered mental status, tachypnea, hypotension, history CHF, GERD  EXAM: CT ABDOMEN AND PELVIS WITHOUT CONTRAST  TECHNIQUE: Multidetector CT imaging of the abdomen and pelvis was performed following the standard protocol without IV contrast. Sagittal and coronal MPR images reconstructed from axial data set. Oral contrast not administered.  COMPARISON:  None   FINDINGS: Exam quality degraded secondary to body habitus, inclusion of patient's arms in imaged field with streak artifacts, and a lack of IV and oral contrast.  Bibasilar atelectasis.  Large hiatal hernia.  Liver, spleen, atrophic pancreas, kidneys, and adrenal glands.  Distended gallbladder without calcification.  Prior ventral hernia repair with mesh.  Free intraperitoneal air and fluid compatible with perforated viscus.  Source of perforation is uncertain.  Diverticulosis of descending and sigmoid colon without wall thickening to suggest diverticulitis.  Questionable wall thickening of gastric antrum unable to exclude gastric tumor or ulcer disease.  Prominent stool at distal ascending through mid transverse colon with suspect prior resection of cecum and proximal ascending colon.  Small bowel loops grossly unremarkable wall thickness suboptimally assessed due to lack of IV contrast and adjacent ascites.  Duodenum decompressed.  Scattered atherosclerotic calcifications.  Small LEFT inguinal hernia containing fat.  No mass or adenopathy.  Scoliosis with degenerative changes of the thoracolumbar spine.  IMPRESSION: Free intraperitoneal air and fluid compatible with perforated viscus of uncertain etiology though questionable wall thickening of the gastric antrum is identified.  Suspect prior resection of the cecum and ascending colon.  Distal colonic diverticulosis without gross evidence of diverticulitis.  Large hiatal hernia.  Distended gallbladder with suspected cholelithiasis by prior ultrasound.  Critical Value/emergent results were called by telephone at the time of interpretation on 08/04/2014 at 1429 hours to Dr. Carmin Muskrat , who verbally acknowledged these results.   Electronically Signed   By: Lavonia Dana M.D.   On: 08/04/2014 14:31  Portable Chest Xray  08/05/2014   CLINICAL DATA:  Intubation.  EXAM: PORTABLE CHEST - 1 VIEW  COMPARISON:  08/04/2014.  FINDINGS: Endotracheal tube in stable  position. Persistent right lower lobe infiltrate consistent pneumonia. No pleural effusion or pneumothorax. Heart size stable. Bilateral shoulder replacements.  IMPRESSION: 1. Endotracheal tube in stable position. 2. Persistent right lower lobe infiltrate consistent with pneumonia.   Electronically Signed   By: Marcello Moores  Register   On: 08/05/2014 07:00   Dg Chest Port 1 View  08/04/2014   CLINICAL DATA:  Ventilator dependence post abdominal surgery, history CHF, pneumonia, GERD  EXAM: PORTABLE CHEST - 1 VIEW  COMPARISON:  Portable exam 2017 hours compared to 08/04/2014 at 1351 hours  FINDINGS: Tip of endotracheal tube projects 4.3 cm above carina.  Rotated to the RIGHT.  Borderline cardiac enlargement.  Atherosclerotic calcification aorta.  Increased perihilar markings which could represent minimal edema similar to previous exam.  However increased opacity is seen at the RIGHT lung base versus the previous study, cannot exclude superimposed consolidation.  No gross pleural effusion or pneumothorax.  BILATERAL shoulder prostheses.  IMPRESSION: Question minimal perihilar edema with increased RIGHT basilar opacity raising question of superimposed consolidation.   Electronically Signed   By: Lavonia Dana M.D.   On: 08/04/2014 21:33   Dg Chest Port 1 View  08/04/2014   CLINICAL DATA:  Hypotension, abdominal pain  EXAM: PORTABLE CHEST - 1 VIEW  COMPARISON:  08/02/2014  FINDINGS: Bilateral interstitial thickening. No pleural effusion or pneumothorax. No focal consolidation. Prominence of the central pulmonary vasculature. Stable cardiomediastinal silhouette. No acute osseous abnormality. Bilateral shoulder arthroplasties.  IMPRESSION: Findings concerning for mild interstitial edema.   Electronically Signed   By: Kathreen Devoid   On: 08/04/2014 14:24    Anti-infectives: Anti-infectives    Start     Dose/Rate Route Frequency Ordered Stop   08/05/14 0600  vancomycin (VANCOCIN) IVPB 1000 mg/200 mL premix  Status:   Discontinued     1,000 mg 200 mL/hr over 60 Minutes Intravenous Every 24 hours 08/04/14 1417 08/04/14 1517   08/04/14 2000  piperacillin-tazobactam (ZOSYN) IVPB 3.375 g     3.375 g 12.5 mL/hr over 240 Minutes Intravenous Every 8 hours 08/04/14 1344     08/04/14 1330  piperacillin-tazobactam (ZOSYN) IVPB 3.375 g     3.375 g 100 mL/hr over 30 Minutes Intravenous  Once 08/04/14 1326 08/04/14 1443   08/04/14 1330  vancomycin (VANCOCIN) IVPB 1000 mg/200 mL premix     1,000 mg 200 mL/hr over 60 Minutes Intravenous  Once 08/04/14 1326 08/04/14 1442      Assessment/Plan: s/p Procedure(s): EXPLORATORY LAPAROTOMY, REPAIR OF PERFORATED ULCER Overall improving. Continue nothing by mouth. Continue IV antibiotics. Out of bed to chair and work on pulmonary toilet   LOS: 2 days    Teylor Wolven T 08/06/2014

## 2014-08-06 NOTE — Progress Notes (Signed)
eLink Physician-Brief Progress Note Patient Name: Monique Russo DOB: Jan 15, 1931 MRN: [id number]     Pain fu  On camer a exam: Sleeping comfortalbly   PULMONARY  Recent Labs Lab 08/05/14 0418  PHART 7.409  PCO2ART 35.5  PO2ART 76.8*  HCO3 22.0  TCO2 19.9  O2SAT 95.9    CBC  Recent Labs Lab 08/05/14 0340 08/06/14 0345 08/06/14 2020  HGB 12.1 9.6* 10.1*  HCT 38.7 29.7* 31.4*  WBC 21.6* 15.5* 12.7*  PLT 434* 379 374    COAGULATION No results for input(s): INR in the last 168 hours.  CARDIAC   Recent Labs Lab 08/04/14 1256 08/06/14 2020  TROPONINI <0.03 0.04*   No results for input(s): PROBNP in the last 168 hours.   CHEMISTRY  Recent Labs Lab 08/04/14 1256 08/04/14 2110 08/05/14 0025 08/05/14 0340 08/06/14 0345 08/06/14 2020  NA 134*  --  131* 136 133* 134*  K 4.5  --  5.4* 5.5* 4.4 4.4  CL 97*  --  103 101 105 103  CO2 22  --  22 25 25 23   GLUCOSE 142*  --  121* 138* 118* 125*  BUN 26*  --  23* 22* 18 11  CREATININE 1.60* 1.19* 1.11* 1.08* 0.87 0.68  CALCIUM 9.1  --  8.5* 8.6* 8.4* 8.9   Estimated Creatinine Clearance: 54.9 mL/min (by C-G formula based on Cr of 0.68).   LIVER  Recent Labs Lab 08/02/14 2111 08/04/14 1256 08/06/14 2020  AST 40 30 18  ALT 28 28 16   ALKPHOS 98 82 87  BILITOT 0.5 0.8 0.5  PROT 7.8 6.6 6.4*  ALBUMIN 3.3* 3.0* 2.3*     INFECTIOUS  Recent Labs Lab 08/04/14 1308 08/04/14 2110 08/06/14 1930  LATICACIDVEN 5.42* 3.9* 1.3     ENDOCRINE CBG (last 3)  No results for input(s): GLUCAP in the last 72 hours.       IMAGING x48h  - Portable Chest Xray  08/05/2014   CLINICAL DATA:  Intubation.  EXAM: PORTABLE CHEST - 1 VIEW  COMPARISON:  08/04/2014.  FINDINGS: Endotracheal tube in stable position. Persistent right lower lobe infiltrate consistent pneumonia. No pleural effusion or pneumothorax. Heart size stable. Bilateral shoulder replacements.  IMPRESSION: 1. Endotracheal tube in stable  position. 2. Persistent right lower lobe infiltrate consistent with pneumonia.   Electronically Signed   By: Marcello Moores  Register   On: 08/05/2014 07:00   Dg Abd Portable 1v  08/06/2014   CLINICAL DATA:  Patient underwent exploratory laparotomy on Tuesday, now with severe diffuse abdominal pain.  EXAM: PORTABLE ABDOMEN - 1 VIEW  COMPARISON:  CT abdomen pelvis- 08/04/2014; chest radiograph- 08/05/2014  FINDINGS: Post ventral abdominal wall hernia repair.  Paucity of bowel gas without definite evidence of obstruction. No definite pneumatosis or portal venous gas.  Nondiagnostic evaluation for pneumoperitoneum secondary to supine positioning and exclusion of the lower thorax.  Left basilar consolidative opacities are suspected though incompletely evaluated.  Moderate to severe scoliotic curvature of the thoracolumbar spine with associated moderate to severe multilevel DDD, incompletely evaluated.  IMPRESSION: 1. Paucity of bowel gas without evidence of obstruction 2. Post ventral wall hernia repair. 3. Suspected left basilar consolidative opacities. Further evaluation dedicated chest radiograph could be performed as clinically indicated.   Electronically Signed   By: Sandi Mariscal M.D.   On: 08/06/2014 19:06    Lipase ok   PLAN Nil acute on labs Likely surgical incision pain Seems improved now [place] to monitor     Intervention  Category Intermediate Interventions: Abdominal pain - evaluation and management  Shay Jhaveri 08/06/2014, 9:57 PM

## 2014-08-06 NOTE — Progress Notes (Addendum)
ELink physician on call notified of patient increase in abdominal pain near incision and left shoulder pain. Patient currently on dilaudid PCA and states pain has gotten worse over the last hour. [facility] physician on call (Dr. Marcello Moores) notified of patient complaint of abdominal pain. Dr. Marcello Moores states abdominal pain is likely from incision. Will continue to monitor.

## 2014-08-07 MED ORDER — DEXTROSE-NACL 5-0.45 % IV SOLN
INTRAVENOUS | Status: DC
  Administered 2014-08-07: 12:00:00 via INTRAVENOUS
  Administered 2014-08-08: 75 mL/h via INTRAVENOUS
  Administered 2014-08-09: 19:00:00 via INTRAVENOUS
  Administered 2014-08-10: 75 mL/h via INTRAVENOUS
  Administered 2014-08-10: 20:00:00 via INTRAVENOUS

## 2014-08-07 MED ORDER — CHLORHEXIDINE GLUCONATE CLOTH 2 % EX PADS
6.0000 | MEDICATED_PAD | Freq: Every morning | CUTANEOUS | Status: DC
  Administered 2014-08-07 – 2014-08-11 (×3): 6 via TOPICAL

## 2014-08-07 NOTE — Progress Notes (Addendum)
19 ml dilaudid PCA wasted in sink  Shirlee Limerick and Javier Glazier RN

## 2014-08-07 NOTE — Progress Notes (Signed)
Pt has had uncontrollable pain for the last several hours through nursing shift change. At report I became aware that the patient's pain has increased and the MD was notified, labs were ordered and a KUB has been performed. PTcontinually encouraged to use her dilaudid PCA, the patient is repeatedly forgetting and her pain level remains elevated at an 8-9 continuously for the last several hours. Her home medications were reviewed and I notified CCM of her extensive pain home regimen. CCS called as well about unmanaged pain and asked about NPO status. The on-call Dr. Marcello Moores informed me that the pt must remain completely NPO including medications. I made her aware of the pt's pain and that CCM was changing her pain medications. Physicians also notified of pt's absence of bowel sounds upon assessment.   Orders changed from Dilaudid PCA to PRN pushes to ensure the pt is receiving coverage for her pain.  Will continue to monitor and assess patient.

## 2014-08-07 NOTE — Progress Notes (Signed)
08/07/14 1640  Patient has coughed up bloody sputum (quarter size amount) in a napkin.

## 2014-08-07 NOTE — Progress Notes (Signed)
Patient ID: Monique Russo, female   DOB: 12-Feb-1931, 81 y.o.   MRN: [id number] 3 Days Post-Op  Subjective: "don't know what is wrong with me".  She denies pain or nausea. No flatus or bowel movements. He just doesn't feel good.  Objective: Vital signs in last 24 hours: Temp:  [98.3 F (36.8 C)-99.4 F (37.4 C)] 99.4 F (37.4 C) (05/20 0400) Resp:  [10-24] 22 (05/20 0800) BP: (98-154)/(19-92) 115/90 mmHg (05/20 0800) SpO2:  [95 %-100 %] 100 % (05/20 0800) Last BM Date:  (prior to admission)  Intake/Output from previous day: 05/19 0701 - 05/20 0700 In: 2550 [I.V.:2400; IV Piggyback:150] Out: 1750 [Urine:1750] Intake/Output this shift: Total I/O In: 100 [I.V.:100] Out: -   General appearance: alert, cooperative, no distress and looks generally much brighter than yesterday. Resp: clear to auscultation bilaterally GI: mmild appropriate tenderness. Incision/Wound: wwound packed open, dressing clean and dry  Lab Results:   Recent Labs  08/06/14 0345 08/06/14 2020  WBC 15.5* 12.7*  HGB 9.6* 10.1*  HCT 29.7* 31.4*  PLT 379 374   BMET  Recent Labs  08/06/14 0345 08/06/14 2020  NA 133* 134*  K 4.4 4.4  CL 105 103  CO2 25 23  GLUCOSE 118* 125*  BUN 18 11  CREATININE 0.87 0.68  CALCIUM 8.4* 8.9     Studies/Results: Dg Abd Portable 1v  08/06/2014   CLINICAL DATA:  Patient underwent exploratory laparotomy on Tuesday, now with severe diffuse abdominal pain.  EXAM: PORTABLE ABDOMEN - 1 VIEW  COMPARISON:  CT abdomen pelvis- 08/04/2014; chest radiograph- 08/05/2014  FINDINGS: Post ventral abdominal wall hernia repair.  Paucity of bowel gas without definite evidence of obstruction. No definite pneumatosis or portal venous gas.  Nondiagnostic evaluation for pneumoperitoneum secondary to supine positioning and exclusion of the lower thorax.  Left basilar consolidative opacities are suspected though incompletely evaluated.  Moderate to severe scoliotic curvature of the  thoracolumbar spine with associated moderate to severe multilevel DDD, incompletely evaluated.  IMPRESSION: 1. Paucity of bowel gas without evidence of obstruction 2. Post ventral wall hernia repair. 3. Suspected left basilar consolidative opacities. Further evaluation dedicated chest radiograph could be performed as clinically indicated.   Electronically Signed   By: Sandi Mariscal M.D.   On: 08/06/2014 19:06    Anti-infectives: Anti-infectives    Start     Dose/Rate Route Frequency Ordered Stop   08/05/14 0600  vancomycin (VANCOCIN) IVPB 1000 mg/200 mL premix  Status:  Discontinued     1,000 mg 200 mL/hr over 60 Minutes Intravenous Every 24 hours 08/04/14 1417 08/04/14 1517   08/04/14 2000  piperacillin-tazobactam (ZOSYN) IVPB 3.375 g     3.375 g 12.5 mL/hr over 240 Minutes Intravenous Every 8 hours 08/04/14 1344     08/04/14 1330  piperacillin-tazobactam (ZOSYN) IVPB 3.375 g     3.375 g 100 mL/hr over 30 Minutes Intravenous  Once 08/04/14 1326 08/04/14 1443   08/04/14 1330  vancomycin (VANCOCIN) IVPB 1000 mg/200 mL premix     1,000 mg 200 mL/hr over 60 Minutes Intravenous  Once 08/04/14 1326 08/04/14 1442      Assessment/Plan: s/p Procedure(s): EXPLORATORY LAPAROTOMY, REPAIR OF PERFORATED ULCER Seems to be steadily improving overall. Transfer to floor. Try clear liquid diet.   LOS: 3 days    Bowie Delia T 08/07/2014

## 2014-08-08 LAB — BODY FLUID CULTURE: Culture: NO GROWTH

## 2014-08-08 LAB — CLOSTRIDIUM DIFFICILE BY PCR: Toxigenic C. Difficile by PCR: NEGATIVE

## 2014-08-08 MED ORDER — TIMOLOL MALEATE 0.5 % OP SOLN
1.0000 [drp] | Freq: Every day | OPHTHALMIC | Status: DC
  Filled 2014-08-08: qty 5

## 2014-08-08 MED ORDER — ACETAMINOPHEN 325 MG PO TABS
325.0000 mg | ORAL_TABLET | Freq: Four times a day (QID) | ORAL | Status: DC | PRN
Start: 2014-08-08 — End: 2014-08-12

## 2014-08-08 MED ORDER — OXYCODONE-ACETAMINOPHEN 5-325 MG PO TABS: 1.0000 | ORAL_TABLET | Freq: Four times a day (QID) | ORAL | Status: DC | PRN

## 2014-08-08 MED ORDER — ZOLPIDEM TARTRATE 5 MG PO TABS
5.0000 mg | ORAL_TABLET | Freq: Every evening | ORAL | Status: DC | PRN
  Administered 2014-08-08 – 2014-08-12 (×4): 5 mg via ORAL
  Filled 2014-08-08 (×4): qty 1

## 2014-08-08 MED ORDER — DOCUSATE SODIUM 100 MG PO CAPS: 100.0000 mg | ORAL_CAPSULE | Freq: Three times a day (TID) | ORAL | Status: DC | PRN

## 2014-08-08 MED ORDER — TEMAZEPAM 15 MG PO CAPS: 30.0000 mg | ORAL_CAPSULE | Freq: Every evening | ORAL | Status: DC | PRN

## 2014-08-08 MED ORDER — DICLOFENAC SODIUM 1 % TD GEL
2.0000 g | Freq: Four times a day (QID) | TRANSDERMAL | Status: DC
  Administered 2014-08-08 – 2014-08-12 (×16): 2 g via TOPICAL
  Filled 2014-08-08 (×2): qty 100

## 2014-08-08 MED ORDER — FLUTICASONE PROPIONATE 50 MCG/ACT NA SUSP
1.0000 | Freq: Every day | NASAL | Status: DC
  Administered 2014-08-09 – 2014-08-11 (×3): 1 via NASAL
  Filled 2014-08-08: qty 16

## 2014-08-08 MED ORDER — DULOXETINE HCL 60 MG PO CPEP
60.0000 mg | ORAL_CAPSULE | Freq: Every day | ORAL | Status: DC
  Administered 2014-08-08 – 2014-08-11 (×4): 60 mg via ORAL
  Filled 2014-08-08 (×5): qty 1

## 2014-08-08 MED ORDER — METHOCARBAMOL 500 MG PO TABS
750.0000 mg | ORAL_TABLET | Freq: Three times a day (TID) | ORAL | Status: DC | PRN
  Administered 2014-08-11 – 2014-08-12 (×2): 750 mg via ORAL
  Filled 2014-08-08 (×2): qty 2

## 2014-08-08 MED ORDER — FUROSEMIDE 80 MG PO TABS
80.0000 mg | ORAL_TABLET | Freq: Every day | ORAL | Status: DC | PRN
  Filled 2014-08-08: qty 1

## 2014-08-08 MED ORDER — METHADONE HCL 5 MG PO TABS
5.0000 mg | ORAL_TABLET | Freq: Three times a day (TID) | ORAL | Status: DC
  Administered 2014-08-08 – 2014-08-12 (×11): 5 mg via ORAL
  Filled 2014-08-08 (×12): qty 1

## 2014-08-08 MED ORDER — DULOXETINE HCL 60 MG PO CPEP
60.0000 mg | ORAL_CAPSULE | Freq: Every day | ORAL | Status: DC
  Filled 2014-08-08: qty 1

## 2014-08-08 MED ORDER — OXYCODONE-ACETAMINOPHEN 5-325 MG PO TABS
1.0000 | ORAL_TABLET | ORAL | Status: DC | PRN
  Administered 2014-08-09 – 2014-08-12 (×9): 1 via ORAL
  Filled 2014-08-08 (×11): qty 1

## 2014-08-08 MED ORDER — BISACODYL 10 MG RE SUPP: 10.0000 mg | Freq: Every day | RECTAL | Status: DC | PRN

## 2014-08-08 MED ORDER — GABAPENTIN 400 MG PO CAPS
400.0000 mg | ORAL_CAPSULE | Freq: Two times a day (BID) | ORAL | Status: DC
  Administered 2014-08-08 – 2014-08-12 (×8): 400 mg via ORAL
  Filled 2014-08-08 (×10): qty 1

## 2014-08-08 MED ORDER — TIMOLOL MALEATE 0.5 % OP SOLN
1.0000 [drp] | Freq: Every day | OPHTHALMIC | Status: DC
  Administered 2014-08-08 – 2014-08-12 (×3): 1 [drp] via OPHTHALMIC
  Filled 2014-08-08: qty 5

## 2014-08-08 NOTE — Progress Notes (Signed)
Able to arouse pt, but she slept entire day after receiving 0715 pain meds.  Daughter with pt all day and reported pt has not been able to sleep for any period of time for several days.  Said when pt was being changed after her BMs today, she would arouse and one time reported being in pain, but immediately fell back asleep.  Sleep appeared calm and restful with no distress observed.  Acknowledged her family when awakened, but RN unable to awaken pt sufficiently to administer oral meds.  Will update oncoming nurse with pt's status.

## 2014-08-08 NOTE — Progress Notes (Signed)
ANTIBIOTIC CONSULT NOTE - FOLLOW UP  Pharmacy Consult for zosyn  Indication: intra-abdominal infection, sepsis  No Known Allergies  Patient Measurements: Height: 5' (152.4 cm) Weight: 197 lb 1.5 oz (89.4 kg) IBW/kg (Calculated) : 45.5   Vital Signs: Temp: 98.4 F (36.9 C) (05/21 1000) Temp Source: Oral (05/21 1000) BP: 124/46 mmHg (05/21 1000) Pulse Rate: 91 (05/21 1000) Intake/Output from previous day: 05/20 0701 - 05/21 0700 In: 2018.8 [P.O.:600; I.V.:1368.8; IV Piggyback:50] Out: 3100 [Urine:3100] Intake/Output from this shift: Total I/O In: 0  Out: 350 [Urine:350]  Labs:  Recent Labs  08/06/14 0345 08/06/14 2020  WBC 15.5* 12.7*  HGB 9.6* 10.1*  PLT 379 374  CREATININE 0.87 0.68   Estimated Creatinine Clearance: 54.9 mL/min (by C-G formula based on Cr of 0.68). No results for input(s): VANCOTROUGH, VANCOPEAK, VANCORANDOM, GENTTROUGH, GENTPEAK, GENTRANDOM, TOBRATROUGH, TOBRAPEAK, TOBRARND, AMIKACINPEAK, AMIKACINTROU, AMIKACIN in the last 72 hours.   Microbiology: Recent Results (from the past 720 hour(s))  Blood culture (routine x 2)     Status: None   Collection Time: 07/17/14  6:22 PM  Result Value Ref Range Status   Specimen Description BLOOD RIGHT HAND  Final   Special Requests BOTTLES DRAWN AEROBIC ONLY 5ML  Final   Culture   Final    NO GROWTH 5 DAYS Performed at [facility]    Report Status 07/24/2014 FINAL  Final  Blood culture (routine x 2)     Status: None   Collection Time: 07/17/14  6:23 PM  Result Value Ref Range Status   Specimen Description BLOOD RIGHT ARM  Final   Special Requests BOTTLES DRAWN AEROBIC AND ANAEROBIC 5CC  Final   Culture   Final    NO GROWTH 5 DAYS Performed at [facility]    Report Status 07/24/2014 FINAL  Final  Culture, Urine     Status: None   Collection Time: 07/17/14 10:46 PM  Result Value Ref Range Status   Specimen Description URINE, CATHETERIZED  Final   Special Requests NONE  Final   Colony Count NO GROWTH Performed at [facility]   Final   Culture NO GROWTH Performed at [facility]   Final   Report Status 07/18/2014 FINAL  Final  Culture, sputum-assessment     Status: None   Collection Time: 07/19/14  2:41 AM  Result Value Ref Range Status   Specimen Description SPUTUM  Final   Special Requests NONE  Final   Sputum evaluation   Final    THIS SPECIMEN IS ACCEPTABLE. RESPIRATORY CULTURE REPORT TO FOLLOW.   Report Status 07/19/2014 FINAL  Final  Culture, respiratory (NON-Expectorated)     Status: None   Collection Time: 07/19/14  2:41 AM  Result Value Ref Range Status   Specimen Description SPUTUM  Final   Special Requests NONE  Final   Gram Stain   Final    MODERATE WBC PRESENT,BOTH PMN AND MONONUCLEAR RARE SQUAMOUS EPITHELIAL CELLS PRESENT MODERATE GRAM POSITIVE COCCI IN PAIRS IN CLUSTERS IN CHAINS Performed at [facility]    Culture   Final    NORMAL OROPHARYNGEAL FLORA Performed at [facility]    Report Status 07/21/2014 FINAL  Final  Blood Culture (routine x 2)     Status: None (Preliminary result)   Collection Time: 08/04/14  1:47 PM  Result Value Ref Range Status   Specimen Description BLOOD BLOOD RIGHT FOREARM  Final   Special Requests BOTTLES DRAWN AEROBIC ONLY 0.5 CC  Final  Culture   Final           BLOOD CULTURE RECEIVED NO GROWTH TO DATE CULTURE WILL BE HELD FOR 5 DAYS BEFORE ISSUING A FINAL NEGATIVE REPORT Performed at [facility]    Report Status PENDING  Incomplete  Blood Culture (routine x 2)     Status: None (Preliminary result)   Collection Time: 08/04/14  1:47 PM  Result Value Ref Range Status   Specimen Description BLOOD RIGHT ANTECUBITAL  Final   Special Requests   Final    BOTTLES DRAWN AEROBIC AND ANAEROBIC 4.5 CC BLUE,5 CC RED   Culture   Final           BLOOD CULTURE RECEIVED NO GROWTH TO DATE CULTURE WILL BE HELD FOR 5 DAYS BEFORE ISSUING A FINAL NEGATIVE REPORT Performed at  [facility]    Report Status PENDING  Incomplete  Anaerobic culture     Status: None (Preliminary result)   Collection Time: 08/04/14  6:09 PM  Result Value Ref Range Status   Specimen Description PERITONEAL  Final   Special Requests PATIENT ON FOLLOWING VANCOMYCIN AND ZOSYN  Final   Gram Stain   Final    FEW WBC PRESENT,BOTH PMN AND MONONUCLEAR NO ORGANISMS SEEN Performed at [facility]    Culture   Final    NO ANAEROBES ISOLATED; CULTURE IN PROGRESS FOR 5 DAYS Performed at [facility]    Report Status PENDING  Incomplete  Body fluid culture     Status: None   Collection Time: 08/04/14  6:09 PM  Result Value Ref Range Status   Specimen Description PERITONEAL  Final   Special Requests PATIENT ON FOLLOWING VANCOMYCIN AND ZOSYN  Final   Gram Stain   Final    FEW WBC PRESENT,BOTH PMN AND MONONUCLEAR NO ORGANISMS SEEN Performed at [facility]    Culture   Final    NO GROWTH 3 DAYS Performed at [facility]    Report Status 08/08/2014 FINAL  Final  MRSA PCR Screening     Status: Abnormal   Collection Time: 08/04/14  7:58 PM  Result Value Ref Range Status   MRSA by PCR POSITIVE (A) NEGATIVE Final    Comment:        The GeneXpert MRSA Assay (FDA approved for NASAL specimens only), is one component of a comprehensive MRSA colonization surveillance program. It is not intended to diagnose MRSA infection nor to guide or monitor treatment for MRSA infections. RESULT CALLED TO, READ BACK BY AND VERIFIED WITH: WELCH,A/2W @2218  ON 08/04/14 BY KARCZEWSKI,S.     Anti-infectives    Start     Dose/Rate Route Frequency Ordered Stop   08/05/14 0600  vancomycin (VANCOCIN) IVPB 1000 mg/200 mL premix  Status:  Discontinued     1,000 mg 200 mL/hr over 60 Minutes Intravenous Every 24 hours 08/04/14 1417 08/04/14 1517   08/04/14 2000  piperacillin-tazobactam (ZOSYN) IVPB 3.375 g     3.375 g 12.5 mL/hr over 240 Minutes Intravenous Every 8  hours 08/04/14 1344     08/04/14 1330  piperacillin-tazobactam (ZOSYN) IVPB 3.375 g     3.375 g 100 mL/hr over 30 Minutes Intravenous  Once 08/04/14 1326 08/04/14 1443   08/04/14 1330  vancomycin (VANCOCIN) IVPB 1000 mg/200 mL premix     1,000 mg 200 mL/hr over 60 Minutes Intravenous  Once 08/04/14 1326 08/04/14 1442      Assessment: Patient's an 81 y.o F with perforated viscus  with pneumoperitoneum s/p ex lap w/repair of perforated ulcer, currently on zosyn day #5 for broad coverage.  - Afeb, wbc down to 12.7 on 5/19, scr stable  5/17 >> Vancomycin >> 5/17 5/17 >> Zosyn >>    5/17 blood x 2: NGTD 5/17 anareobic peritoneal: ngtd 5/17 peritoneal fluid: neg FINAL 5/17 MRSA PCR: positive   Plan:  - continue zosyn 3.375 gm IV q8h (infuse over 4 hours) - f/u cultures  William Laske P 08/08/2014,2:18 PM

## 2014-08-08 NOTE — Progress Notes (Signed)
Patient ID: Monique Russo, female   DOB: 09-13-1930, 81 y.o.   MRN: [id number] 4 Days Post-Op  Subjective: Pt did not get out of bed yesterday.  Daughter says that they moved rooms, and staff did not get to it.    Objective: Vital signs in last 24 hours: Temp:  [97 F (36.1 C)-98.9 F (37.2 C)] 98.1 F (36.7 C) (05/21 0635) Pulse Rate:  [76-130] 79 (05/21 0635) Resp:  [15-20] 18 (05/21 0635) BP: (120-153)/(60-122) 135/60 mmHg (05/21 0635) SpO2:  [98 %-99 %] 99 % (05/21 0635) Last BM Date:  (prior to admission)  Intake/Output from previous day: 05/20 0701 - 05/21 0700 In: 2018.8 [P.O.:600; I.V.:1368.8; IV Piggyback:50] Out: 3100 [Urine:3100] Intake/Output this shift:    General appearance: alert, cooperative, no distress and looks generally much brighter than yesterday. Resp: clear to auscultation bilaterally GI: mild appropriate tenderness. Incision/Wound:good granulation tissue.   wwound packed open, dressing clean and dry  Lab Results:   Recent Labs  08/06/14 0345 08/06/14 2020  WBC 15.5* 12.7*  HGB 9.6* 10.1*  HCT 29.7* 31.4*  PLT 379 374   BMET  Recent Labs  08/06/14 0345 08/06/14 2020  NA 133* 134*  K 4.4 4.4  CL 105 103  CO2 25 23  GLUCOSE 118* 125*  BUN 18 11  CREATININE 0.87 0.68  CALCIUM 8.4* 8.9     Studies/Results: Dg Abd Portable 1v  08/06/2014   CLINICAL DATA:  Patient underwent exploratory laparotomy on Tuesday, now with severe diffuse abdominal pain.  EXAM: PORTABLE ABDOMEN - 1 VIEW  COMPARISON:  CT abdomen pelvis- 08/04/2014; chest radiograph- 08/05/2014  FINDINGS: Post ventral abdominal wall hernia repair.  Paucity of bowel gas without definite evidence of obstruction. No definite pneumatosis or portal venous gas.  Nondiagnostic evaluation for pneumoperitoneum secondary to supine positioning and exclusion of the lower thorax.  Left basilar consolidative opacities are suspected though incompletely evaluated.  Moderate to severe scoliotic  curvature of the thoracolumbar spine with associated moderate to severe multilevel DDD, incompletely evaluated.  IMPRESSION: 1. Paucity of bowel gas without evidence of obstruction 2. Post ventral wall hernia repair. 3. Suspected left basilar consolidative opacities. Further evaluation dedicated chest radiograph could be performed as clinically indicated.   Electronically Signed   By: Sandi Mariscal M.D.   On: 08/06/2014 19:06    Anti-infectives: Anti-infectives    Start     Dose/Rate Route Frequency Ordered Stop   08/05/14 0600  vancomycin (VANCOCIN) IVPB 1000 mg/200 mL premix  Status:  Discontinued     1,000 mg 200 mL/hr over 60 Minutes Intravenous Every 24 hours 08/04/14 1417 08/04/14 1517   08/04/14 2000  piperacillin-tazobactam (ZOSYN) IVPB 3.375 g     3.375 g 12.5 mL/hr over 240 Minutes Intravenous Every 8 hours 08/04/14 1344     08/04/14 1330  piperacillin-tazobactam (ZOSYN) IVPB 3.375 g     3.375 g 100 mL/hr over 30 Minutes Intravenous  Once 08/04/14 1326 08/04/14 1443   08/04/14 1330  vancomycin (VANCOCIN) IVPB 1000 mg/200 mL premix     1,000 mg 200 mL/hr over 60 Minutes Intravenous  Once 08/04/14 1326 08/04/14 1442      Assessment/Plan: s/p Procedure(s): EXPLORATORY LAPAROTOMY, REPAIR OF PERFORATED ULCER Seems to be steadily improving overall. Advance to full liquids Start home medications.   Add back lasix, timolol drops, neurontin, volaren gel. Oral narcotics PT consult.     LOS: 4 days    [facility] 08/08/2014

## 2014-08-09 LAB — ANAEROBIC CULTURE

## 2014-08-09 MED ORDER — PANTOPRAZOLE SODIUM 40 MG PO TBEC
40.0000 mg | DELAYED_RELEASE_TABLET | Freq: Two times a day (BID) | ORAL | Status: DC
  Administered 2014-08-09 – 2014-08-12 (×6): 40 mg via ORAL
  Filled 2014-08-09 (×7): qty 1

## 2014-08-09 NOTE — Plan of Care (Signed)
Problem: Phase I Progression Outcomes Goal: OOB as tolerated unless otherwise ordered Outcome: Not Progressing Did not get OOB 5/21 due to drowsiness/lethargy, PT consult ordered

## 2014-08-09 NOTE — Progress Notes (Signed)
Husband at bedside helping pt to eat and giving her support.

## 2014-08-09 NOTE — Plan of Care (Signed)
Problem: Phase I Progression Outcomes Goal: Voiding-avoid urinary catheter unless indicated Outcome: Progressing Catheter to be dc'd today

## 2014-08-09 NOTE — Evaluation (Addendum)
Physical Therapy Evaluation Patient Details Name: Monique Russo MRN: [id number] DOB: 02/22/1931 Today's Date: 08/09/2014   History of Present Illness  81 yo female admitted with hemoptysis, perforated ulcer, s/p exp lap on 08/04/14; PMhx of CHF, arthritis, chronic back pain, R shoulder surgery  Clinical Impression  Pt admitted with above diagnosis. Pt currently with functional limitations due to the deficits listed below (see PT Problem List).  Pt will benefit from skilled PT to increase their independence and safety with mobility to allow discharge to the venue listed below.   Pt amb 8' with RW, min/mod assist and much encouragement to participate; Pt is quite deconditioned even from her fairly low level baseline; Husband unable to manage her at her current status; recommend SNF at this time,  will continue to follow for needs     Follow Up Recommendations SNF    Equipment Recommendations  None recommended by PT    Recommendations for Other Services       Precautions / Restrictions Precautions Precautions: Fall      Mobility  Bed Mobility Overal bed mobility: Needs Assistance Bed Mobility: Rolling;Sidelying to Sit Rolling: Mod assist Sidelying to sit: Mod assist       General bed mobility comments: multi-modal step by step cues for technique and participation, pt requires incr time and encouragement to self assist  Transfers Overall transfer level: Needs assistance Equipment used: Rolling walker (2 wheeled) Transfers: Sit to/from Stand Sit to Stand: Mod assist;Min assist;From elevated surface         General transfer comment: multi-modal cues for hand placment  and  wt shift; incr time required  Ambulation/Gait Ambulation/Gait assistance: Mod assist;Min assist Ambulation Distance (Feet): 8 Feet Assistive device: Rolling walker (2 wheeled) Gait Pattern/deviations: Step-to pattern;Trunk flexed     General Gait Details:+2 for chair and safety; multi-modal cues  for RW position, participation and overall safety;   Stairs            Wheelchair Mobility    Modified Rankin (Stroke Patients Only)       Balance Overall balance assessment: Needs assistance           Standing balance-Leahy Scale: Poor; pt leaning to left side, difficulty correcting with cues; requires support to maintain balance                               Pertinent Vitals/Pain Pain Assessment: Faces Faces Pain Scale: Hurts little more Pain Location: abd Pain Descriptors / Indicators: Sore Pain Intervention(s): Limited activity within patient's tolerance;Monitored during session;Repositioned    Home Living Family/patient expects to be discharged to:: Skilled nursing facility Living Arrangements: Spouse/significant other Available Help at Discharge: Family;Available 24 hours/day;Personal care attendant Type of Home: House Home Access: Stairs to enter   Entrance Stairs-Number of Steps: 1   Home Equipment: Bedside commode;Wheelchair - power;Walker - 2 wheels;Wheelchair - Visual merchandiser - 4 wheels Additional Comments: pt has aide (hired) aide to assist pt with bathing 2x/wk; pt husband reports she doe snot use the power chair at all    Prior Function Level of Independence: Independent with assistive device(s);Needs assistance   Gait / Transfers Assistance Needed: supervision  with RW short distances;   ADL's / Homemaking Assistance Needed: aide assists with bathing x 2 and family assists with dressing  Comments: very little amb for the last 10 days, mostly transfers per pt and husb     Hand Dominance  Extremity/Trunk Assessment   Upper Extremity Assessment: Generalized weakness           Lower Extremity Assessment: Generalized weakness      Cervical / Trunk Assessment: Kyphotic  Communication   Communication: No difficulties  Cognition Arousal/Alertness: Awake/alert Behavior During Therapy: WFL for tasks  assessed/performed Overall Cognitive Status: Within Functional Limits for tasks assessed Area of Impairment: Following commands;Problem solving;Safety/judgement       Following Commands: Follows one step commands with increased time Safety/Judgement: Decreased awareness of deficits   Problem Solving: Decreased initiation;Requires verbal cues;Requires tactile cues General Comments: pt denies having difficulty at home and feels she will be fine at D/C;     General Comments      Exercises        Assessment/Plan    PT Assessment Patient needs continued PT services  PT Diagnosis Difficulty walking;Generalized weakness;Acute pain   PT Problem List Decreased strength;Decreased range of motion;Decreased activity tolerance;Decreased balance;Decreased mobility;Pain;Decreased knowledge of use of DME  PT Treatment Interventions DME instruction;Gait training;Functional mobility training;Therapeutic activities;Therapeutic exercise;Patient/family education;Balance training   PT Goals (Current goals can be found in the Care Plan section) Acute Rehab PT Goals Patient Stated Goal: pt wants to go home, husband interested in rehab to incr pt level of independence Time For Goal Achievement: 08/23/14 Potential to Achieve Goals: Fair    Frequency Min 3X/week   Barriers to discharge   pt husband states he can't care for her at home at her current status    Co-evaluation               End of Session Equipment Utilized During Treatment: Gait belt Activity Tolerance: Patient limited by pain;Patient limited by fatigue Patient left: in chair;with call bell/phone within reach;with family/visitor present           Time: 1345-1419 PT Time Calculation (min) (ACUTE ONLY): 34 min   Charges:   PT Evaluation $Initial PT Evaluation Tier I: 1 Procedure PT Treatments $Gait Training: 8-22 mins   PT G Codes:        Geonna Lockyer 08/17/2014, 2:35 PM

## 2014-08-09 NOTE — Progress Notes (Signed)
General Surgery Note  LOS: 5 days  POD -  5 Days Post-Op  Assessment/Plan: 1.  EXPLORATORY LAPAROTOMY, REPAIR OF PERFORATED ULCER - 08/04/2014 - [facility]  On Zosyn  On full liquids  Needs to ambulate more.  PT is ordered.  1A.  Open wound - clean  2.  DVT prophylaxis - Lovenox 3.  On isolation - MRSA   Active Problems:   Perforated viscus   Perforated duodenal ulcer   Encounter for intubation   Ventilator dependence   Subjective:  Doing okay.  No very hungry.  In room by herself. Objective:   Filed Vitals:   08/09/14 1102  BP: 140/71  Pulse: 85  Temp: 98.9 F (37.2 C)  Resp: 18     Intake/Output from previous day:  05/21 0701 - 05/22 0700 In: 1300 [P.O.:120; I.V.:1130; IV Piggyback:50] Out: 1700 [Urine:1700]  Intake/Output this shift:      Physical Exam:   General: Older WF who is alert.   HEENT: Normal. Pupils equal. .   Lungs: Clear.  IS about 500 cc, but she is not very coordinated doing this.   Abdomen: Soft.   Wound: Clean.  Packing BID.   Lab Results:    Recent Labs  08/06/14 2020  WBC 12.7*  HGB 10.1*  HCT 31.4*  PLT 374    BMET   Recent Labs  08/06/14 2020  NA 134*  K 4.4  CL 103  CO2 23  GLUCOSE 125*  BUN 11  CREATININE 0.68  CALCIUM 8.9    PT/INR  No results for input(s): LABPROT, INR in the last 72 hours.  ABG  No results for input(s): PHART, HCO3 in the last 72 hours.  Invalid input(s): PCO2, PO2   Studies/Results:  No results found.   Anti-infectives:   Anti-infectives    Start     Dose/Rate Route Frequency Ordered Stop   08/05/14 0600  vancomycin (VANCOCIN) IVPB 1000 mg/200 mL premix  Status:  Discontinued     1,000 mg 200 mL/hr over 60 Minutes Intravenous Every 24 hours 08/04/14 1417 08/04/14 1517   08/04/14 2000  piperacillin-tazobactam (ZOSYN) IVPB 3.375 g     3.375 g 12.5 mL/hr over 240 Minutes Intravenous Every 8 hours 08/04/14 1344     08/04/14 1330  piperacillin-tazobactam (ZOSYN) IVPB 3.375 g     3.375  g 100 mL/hr over 30 Minutes Intravenous  Once 08/04/14 1326 08/04/14 1443   08/04/14 1330  vancomycin (VANCOCIN) IVPB 1000 mg/200 mL premix     1,000 mg 200 mL/hr over 60 Minutes Intravenous  Once 08/04/14 1326 08/04/14 1442      Alphonsa Overall, MD, FACS Pager: [facility] Office: [phone] 08/09/2014

## 2014-08-10 LAB — CULTURE, BLOOD (ROUTINE X 2)
Culture: NO GROWTH
Culture: NO GROWTH

## 2014-08-10 NOTE — Clinical Social Work Note (Signed)
Clinical Social Work Assessment  Patient Details  Name: Monique Russo MRN: [id number] Date of Birth: 07-09-1930  Date of referral:  08/10/14               Reason for consult:  Facility Placement                Permission sought to share information with:  Chartered certified accountant granted to share information::  Yes, Verbal Permission Granted  Name::        Agency::     Relationship::     Contact Information:     Housing/Transportation Living arrangements for the past 2 months:  Single Family Home Source of Information:  Patient, Spouse Patient Interpreter Needed:  None Criminal Activity/Legal Involvement Pertinent to Current Situation/Hospitalization:    Significant Relationships:  Spouse Lives with:  Spouse Do you feel safe going back to the place where you live?  No Need for family participation in patient care:  Yes (Comment)  Care giving concerns:  CSW reviewed PT evaluation recommending SNF at discharge.    Social Worker assessment / plan:  CSW met with patient & husband, Rush Landmark at bedside to discuss discharge planning.   Employment status:  Retired Nurse, adult PT Recommendations:  [place] / Referral to community resources:  [place]  Patient/Family's Response to care:  Patient & husband are agreeable with plan for SNF - patient informed CSW that she had been to [facility] in the past but would like to [facility]. CSW confirmed with Claiborne Billings at [facility] that they would have a bed available for her when she's ready for discharge.   Patient/Family's Understanding of and Emotional Response to Diagnosis, Current Treatment, and Prognosis:  Patient's husband seemed optimistic about her being closer to discharge - awaiting doctor's visit from today. CSW left message for Stanton Kidney at [facility] re: insurance authorization for patient to go to [facility].   Emotional Assessment Appearance:   Appears stated age Attitude/Demeanor/Rapport:    Affect (typically observed):  Pleasant, Calm, Happy Orientation:  Oriented to Self, Oriented to Place, Oriented to  Time, Oriented to Situation Alcohol / Substance use:    Psych involvement (Current and /or in the community):  No (Comment)  Discharge Needs  Concerns to be addressed:    Readmission within the last 30 days:    Current discharge risk:    Barriers to Discharge:      Standley Brooking, LCSW 08/10/2014, 12:04 PM

## 2014-08-10 NOTE — Progress Notes (Signed)
Physical Therapy Treatment Patient Details Name: Monique Russo MRN: [id number] DOB: 08-08-1930 Today's Date: 08/10/2014    History of Present Illness 81 yo female admitted with hemoptysis, perforated ulcer, s/p exp lap on 08/04/14; PMhx of CHF, arthritis, chronic back pain, R shoulder surgery    PT Comments    Sit to stand x 2 with +2 assist. Pt only able to tolerate standing with RW for 20 seconds due to "not feeling right". Pt unable to walk. Pt reports her L shoulder is painful if someone pulls on it, but she denied pain during PT session.   Follow Up Recommendations  Supervision/Assistance - 24 hour;SNF     Equipment Recommendations  None recommended by PT    Recommendations for Other Services       Precautions / Restrictions Precautions Precautions: Fall Restrictions Weight Bearing Restrictions: No    Mobility  Bed Mobility               General bed mobility comments: NT-up in chair  Transfers Overall transfer level: Needs assistance Equipment used: Rolling walker (2 wheeled) Transfers: Sit to/from Stand Sit to Stand: Mod assist;From elevated surface         General transfer comment: multi-modal cues for hand placment  and  wt shift; incr time required; sit to stand x 2 trials, pt stated she didn't feel well in standing and wasn't able to walk, pt stood with flexed neck and upper trunk  Ambulation/Gait                 Stairs            Wheelchair Mobility    Modified Rankin (Stroke Patients Only)       Balance             Standing balance-Leahy Scale: Poor                      Cognition Arousal/Alertness: Awake/alert Behavior During Therapy: WFL for tasks assessed/performed Overall Cognitive Status: Within Functional Limits for tasks assessed                      Exercises      General Comments        Pertinent Vitals/Pain Pain Assessment: No/denies pain (pt denied pain at present but stated L  shoulder hurts if someone pulls on her arm) Pain Descriptors / Indicators: Sore    Home Living                      Prior Function            PT Goals (current goals can now be found in the care plan section) Acute Rehab PT Goals Patient Stated Goal: husband interested in rehab to incr pt level of independence Time For Goal Achievement: 08/23/14 Potential to Achieve Goals: Fair Progress towards PT goals: Not progressing toward goals - comment (pt not able to walk today )    Frequency  Min 3X/week    PT Plan Current plan remains appropriate    Co-evaluation             End of Session Equipment Utilized During Treatment: Gait belt Activity Tolerance: Patient limited by fatigue Patient left: in chair;with call bell/phone within reach;with chair alarm set;with family/visitor present     Time: 1042-1101 PT Time Calculation (min) (ACUTE ONLY): 19 min  Charges:  $Therapeutic Activity: 8-22 mins  G Codes:      Philomena Doheny 08/10/2014, 11:37 AM [phone]

## 2014-08-10 NOTE — Progress Notes (Signed)
6 Days Post-Op  Subjective: Says she gets OOB about 3 times a day at home, takes Methadone and Percocet normally at home for pain.  Complained of her shoulder hurting and keeping her up last PM.  She took dilaudid x 1 yesterday, and early this AM.  Open wound looks fine.  She is tolerating PO's. Full liquids.  Objective: Vital signs in last 24 hours: Temp:  [98.1 F (36.7 C)-98.9 F (37.2 C)] 98.3 F (36.8 C) (05/23 0600) Pulse Rate:  [79-85] 85 (05/23 0600) Resp:  [16-19] 19 (05/23 0600) BP: (120-140)/(55-73) 123/64 mmHg (05/23 0600) SpO2:  [92 %-97 %] 92 % (05/23 0600) Last BM Date: 08/09/14 600 PO Stools x 4 recorded Full liquid diet No labs H pyloir stool antigen ordered.  C diff is negative Intake/Output from previous day: 05/22 0701 - 05/23 0700 In: 2670 [P.O.:600; I.V.:1920; IV Piggyback:150] Out: 1600 [Urine:1600] Intake/Output this shift:    General appearance: alert, cooperative, no distress and moaning when she wakes up and with any movement.   Resp: clear to auscultation bilaterally and anterior exam GI: soft sore, complains with movement, I changed dressing and packing it looks fine.  + BS, + BM  Lab Results:  No results for input(s): WBC, HGB, HCT, PLT in the last 72 hours.  BMET No results for input(s): NA, K, CL, CO2, GLUCOSE, BUN, CREATININE, CALCIUM in the last 72 hours. PT/INR No results for input(s): LABPROT, INR in the last 72 hours.   Recent Labs Lab 08/04/14 1256 08/06/14 2020  AST 30 18  ALT 28 16  ALKPHOS 82 87  BILITOT 0.8 0.5  PROT 6.6 6.4*  ALBUMIN 3.0* 2.3*     Lipase     Component Value Date/Time   LIPASE 12* 08/06/2014 2020     Studies/Results: No results found.  Medications: . Chlorhexidine Gluconate Cloth  6 each Topical q morning - 10a  . diclofenac sodium  2 g Topical QID  . DULoxetine  60 mg Oral QHS  . enoxaparin (LOVENOX) injection  40 mg Subcutaneous Q24H  . fluticasone  1 spray Each Nare Daily  . gabapentin   400 mg Oral BID  . methadone  5 mg Oral 3 times per day  . mupirocin ointment  1 application Nasal BID  . pantoprazole  40 mg Oral BID  . piperacillin-tazobactam (ZOSYN)  IV  3.375 g Intravenous Q8H  . timolol  1 drop Both Eyes Daily   . dextrose 5 % and 0.45% NaCl 75 mL/hr (08/10/14 0554)   Prior to Admission medications   Medication Sig Start Date End Date Taking? Authorizing Provider  acetaminophen (TYLENOL) 325 MG tablet Take 325 mg by mouth every 6 (six) hours as needed for mild pain or headache. 07/10/13  Yes Bobby Rumpf York, PA-C  Ascorbic Acid (VITAMIN C) 1000 MG tablet Take 1,000 mg by mouth daily.   Yes Historical Provider, MD  b complex vitamins tablet Take 1 tablet by mouth daily as needed.    Yes Historical Provider, MD  bisacodyl (DULCOLAX) 10 MG suppository Place 1 suppository (10 mg total) rectally daily as needed for moderate constipation (May repeat times one). 07/10/13  Yes Marianne L York, PA-C  budesonide (RHINOCORT AQUA) 32 MCG/ACT nasal spray Place 1 spray into both nostrils daily as needed for rhinitis.   Yes Historical Provider, MD  cholecalciferol (VITAMIN D) 1000 UNITS tablet Take 1,000 Units by mouth daily.   Yes Historical Provider, MD  diclofenac (VOLTAREN) 50 MG EC tablet Take  50 mg by mouth 2 (two) times daily.    Yes Historical Provider, MD  diclofenac sodium (VOLTAREN) 1 % GEL Apply topically every three (3) days as needed (For Leg pain). 1 gm   Yes Historical Provider, MD  docusate sodium (COLACE) 100 MG capsule Take 1 capsule (100 mg total) by mouth 3 (three) times daily as needed. 12/08/13  Yes Grier Mitts, PA-C  DULoxetine (CYMBALTA) 60 MG capsule Take 60 mg by mouth daily. At bedtime   Yes Historical Provider, MD  furosemide (LASIX) 80 MG tablet Take 80 mg by mouth daily as needed for fluid.    Yes Historical Provider, MD  gabapentin (NEURONTIN) 400 MG capsule Take 400 mg by mouth 2 (two) times daily.    Yes Historical Provider, MD  magnesium  hydroxide (MILK OF MAGNESIA) 400 MG/5ML suspension Take 30 mLs by mouth every other day. As needed for constipation   Yes Historical Provider, MD  methadone (DOLOPHINE) 5 MG tablet Take 5 mg by mouth every 8 (eight) hours.    Yes Historical Provider, MD  methocarbamol (ROBAXIN) 750 MG tablet Take 750 mg by mouth every 8 (eight) hours as needed for muscle spasms (Prescribed TID but patient takes PRN).    Yes Historical Provider, MD  Omega-3 Fatty Acids (FISH OIL) 1200 MG CAPS Take 2 capsules by mouth daily.   Yes Historical Provider, MD  oxyCODONE-acetaminophen (PERCOCET) 10-325 MG per tablet Take 1 tablet by mouth every 8 (eight) hours as needed for pain.   Yes Historical Provider, MD  potassium chloride SA (K-DUR,KLOR-CON) 20 MEQ tablet Take 20 mEq by mouth daily as needed (Only takes with lasix).   Yes Historical Provider, MD  temazepam (RESTORIL) 30 MG capsule Take 1 capsule (30 mg total) by mouth at bedtime as needed for sleep. 07/10/13  Yes Marianne L York, PA-C  timolol (BETIMOL) 0.5 % ophthalmic solution Place 1 drop into both eyes daily.    Yes Historical Provider, MD  vitamin E 400 UNIT capsule Take 400 Units by mouth daily.    Yes Historical Provider, MD  oxyCODONE-acetaminophen (PERCOCET/ROXICET) 5-325 MG per tablet Take 1 tablet by mouth every 6 (six) hours as needed for severe pain. Patient not taking: Reported on 08/04/2014 08/03/14   Merryl Hacker, MD     Assessment/Plan Perforated pyloric ulcer S/p Exploratory laparotomy, with repair of ulcer,  08/04/14,Dr. Johnathan Hausen Severe Arthritis previously on Hospice for this Chronic pain Hx of CHF Hx of pneumonia  Hx of GERD Hx of depression Antibiotics:  Day 7 Zosyn DVT:  Lovenox/SCD   Plan:  PT, case manager consult, husband is not here and he appears to be primary caretaker here.  Soft diet, H pylori is ordered.  Decrease IV fluids, I will check on length of antibiotics, work on IS, and mobilization. RECHECK LABS IN AM.  LOS:  6 days    Brandice Busser 08/10/2014

## 2014-08-10 NOTE — Clinical Social Work Placement (Signed)
Patient has a bed at [facility] when stable for discharge.   *MD: please sign FL2 on front of shadow chart.     Raynaldo Opitz, [facility] Clinical Social Worker cell #: [phone]    CLINICAL SOCIAL WORK PLACEMENT  NOTE  Date:  08/10/2014  Patient Details  Name: Esmee Fallaw MRN: [id number] Date of Birth: May 06, 1930  Clinical Social Work is seeking post-discharge placement for this patient at the [place] level of care (*CSW will initial, date and re-position this form in  chart as items are completed):  Yes   Patient/family provided with [facility] Work Department's list of facilities offering this level of care within the geographic area requested by the patient (or if unable, by the patient's family).  Yes   Patient/family informed of their freedom to choose among providers that offer the needed level of care, that participate in Medicare, Medicaid or managed care program needed by the patient, have an available bed and are willing to accept the patient.  Yes   Patient/family informed of [facility]'s ownership interest in [facility] and [facility], as well as of the fact that they are under no obligation to receive care at these facilities.  PASRR submitted to EDS on 08/10/14     PASRR number received on 08/10/14     Existing PASRR number confirmed on       FL2 transmitted to all facilities in geographic area requested by pt/family on 08/10/14     FL2 transmitted to all facilities within larger geographic area on       Patient informed that his/her managed care company has contracts with or will negotiate with certain facilities, including the following:        Yes   Patient/family informed of bed offers received.  Patient chooses bed at [facility]     Physician recommends and patient chooses bed at      Patient to be transferred to [facility] on  .  Patient to be transferred to  facility by       Patient family notified on   of transfer.  Name of family member notified:        PHYSICIAN       Additional Comment:    _______________________________________________ Standley Brooking, LCSW 08/10/2014, 12:07 PM

## 2014-08-10 NOTE — Care Management Note (Signed)
Case Management Note  Patient Details  Name: Monique Russo MRN: [id number] Date of Birth: [birth year]  Subjective/Objective:         S/p Exploratory laparotomy, with repair of ulcer, 08/04/14           Action/Plan: Discharge planning, referred to CSW  Expected Discharge Date:   (UNKNOWN)               Expected Discharge Plan:  Skilled Nursing Facility  In-House Referral:  Clinical Social Work  Discharge planning Services  CM Consult  Post Acute Care Choice:  NA Choice offered to:  NA  DME Arranged:    DME Agency:     HH Arranged:    [place] Agency:     Status of Service:  Completed, signed off  Medicare Important Message Given:    Date Medicare IM Given:    Medicare IM give by:    Date Additional Medicare IM Given:    Additional Medicare Important Message give by:     If discussed at [place] of Stay Meetings, dates discussed:    Additional Comments:  Guadalupe Maple, RN 08/10/2014, 11:41 AM

## 2014-08-11 ENCOUNTER — Encounter (HOSPITAL_COMMUNITY): Payer: Self-pay | Admitting: General Surgery

## 2014-08-11 DIAGNOSIS — K255 Chronic or unspecified gastric ulcer with perforation: Secondary | ICD-10-CM

## 2014-08-11 HISTORY — DX: Chronic or unspecified gastric ulcer with perforation: K25.5

## 2014-08-11 LAB — BASIC METABOLIC PANEL
Anion gap: 8 (ref 5–15)
BUN: <5 mg/dL — ABNORMAL LOW (ref 6–20)
CHLORIDE: 103 mmol/L (ref 101–111)
CO2: 27 mmol/L (ref 22–32)
Calcium: 8.1 mg/dL — ABNORMAL LOW (ref 8.9–10.3)
Creatinine, Ser: 0.62 mg/dL (ref 0.44–1.00)
GFR calc non Af Amer: >60 mL/min (ref >60)
GLUCOSE: 111 mg/dL — AB (ref 65–99)
Potassium: 2.4 mmol/L — CL (ref 3.5–5.1)
SODIUM: 138 mmol/L (ref 135–145)

## 2014-08-11 LAB — H.PYLORI ANTIGEN, STOOL

## 2014-08-11 LAB — MAGNESIUM: Magnesium: 1.6 mg/dL — ABNORMAL LOW (ref 1.7–2.4)

## 2014-08-11 LAB — CBC
HEMATOCRIT: 26.9 % — AB (ref 36.0–46.0)
Hemoglobin: 8.7 g/dL — ABNORMAL LOW (ref 12.0–15.0)
MCH: 31.6 pg (ref 26.0–34.0)
MCHC: 32.3 g/dL (ref 30.0–36.0)
MCV: 97.8 fL (ref 78.0–100.0)
Platelets: 339 10*3/uL (ref 150–400)
RBC: 2.75 MIL/uL — ABNORMAL LOW (ref 3.87–5.11)
RDW: 13.2 % (ref 11.5–15.5)
WBC: 9.6 10*3/uL (ref 4.0–10.5)

## 2014-08-11 MED ORDER — KCL IN DEXTROSE-NACL 40-5-0.45 MEQ/L-%-% IV SOLN
INTRAVENOUS | Status: DC
  Administered 2014-08-11: 08:00:00 via INTRAVENOUS
  Administered 2014-08-11: 1000 mL via INTRAVENOUS
  Filled 2014-08-11 (×3): qty 1000

## 2014-08-11 MED ORDER — MAGNESIUM SULFATE 50 % IJ SOLN
3.0000 g | Freq: Once | INTRAVENOUS | Status: AC
  Administered 2014-08-11: 3 g via INTRAVENOUS
  Filled 2014-08-11: qty 6

## 2014-08-11 MED ORDER — BOOST PLUS PO LIQD
237.0000 mL | Freq: Two times a day (BID) | ORAL | Status: DC
  Administered 2014-08-11 – 2014-08-12 (×2): 237 mL via ORAL
  Filled 2014-08-11 (×3): qty 237

## 2014-08-11 MED ORDER — MAGNESIUM OXIDE 400 (241.3 MG) MG PO TABS
200.0000 mg | ORAL_TABLET | Freq: Two times a day (BID) | ORAL | Status: DC
  Filled 2014-08-11 (×2): qty 0.5

## 2014-08-11 MED ORDER — SACCHAROMYCES BOULARDII 250 MG PO CAPS
250.0000 mg | ORAL_CAPSULE | Freq: Two times a day (BID) | ORAL | Status: DC
  Administered 2014-08-11 – 2014-08-12 (×3): 250 mg via ORAL
  Filled 2014-08-11 (×4): qty 1

## 2014-08-11 MED ORDER — POTASSIUM CHLORIDE CRYS ER 20 MEQ PO TBCR
30.0000 meq | EXTENDED_RELEASE_TABLET | Freq: Three times a day (TID) | ORAL | Status: DC
  Administered 2014-08-11 – 2014-08-12 (×4): 30 meq via ORAL
  Filled 2014-08-11 (×8): qty 1

## 2014-08-11 MED ORDER — POTASSIUM CHLORIDE CRYS ER 20 MEQ PO TBCR
20.0000 meq | EXTENDED_RELEASE_TABLET | Freq: Three times a day (TID) | ORAL | Status: DC
  Administered 2014-08-11: 20 meq via ORAL
  Filled 2014-08-11: qty 1

## 2014-08-11 MED ORDER — TAB-A-VITE/IRON PO TABS
1.0000 | ORAL_TABLET | Freq: Every day | ORAL | Status: DC
  Administered 2014-08-11: 1 via ORAL
  Filled 2014-08-11 (×3): qty 1

## 2014-08-11 NOTE — Progress Notes (Signed)
7 Days Post-Op  Subjective: No real change, she did OK with PO, not eating much, it doesn't really appeal to her yet. She has been up to BR, not much in the way of hall ambulation. She has a bed at the [facility] home when ready and FL2 is on chart to be signed.  She requires 2 people to assist in sitting up to stand.  PT has recommended SNF also.    Objective: Vital signs in last 24 hours: Temp:  [97.3 F (36.3 C)-98.4 F (36.9 C)] 97.3 F (36.3 C) (05/24 0650) Pulse Rate:  [80-83] 80 (05/24 0650) Resp:  [18] 18 (05/24 0650) BP: (112-131)/(53-74) 112/72 mmHg (05/24 0650) SpO2:  [93 %-94 %] 94 % (05/24 0650) Last BM Date: 08/10/14 420 PO recorded Soft diet 2 stools recorded Afebrile, VSS K+ 2.4 H/H is down Intake/Output from previous day: 05/23 0701 - 05/24 0700 In: 2415 [P.O.:420; I.V.:1995] Out: 800 [Urine:800] Intake/Output this shift: Total I/O In: 677.5 [I.V.:677.5] Out: -   General appearance: alert, cooperative and no distress Resp: clear to auscultation bilaterally GI: soft sore, open wound looks fine.  + BS and stools, are more firm.  Lab Results:   Recent Labs  08/11/14 0502  WBC 9.6  HGB 8.7*  HCT 26.9*  PLT 339    BMET  Recent Labs  08/11/14 0502  NA 138  K 2.4*  CL 103  CO2 27  GLUCOSE 111*  BUN <5*  CREATININE 0.62  CALCIUM 8.1*   PT/INR No results for input(s): LABPROT, INR in the last 72 hours.   Recent Labs Lab 08/04/14 1256 08/06/14 2020  AST 30 18  ALT 28 16  ALKPHOS 82 87  BILITOT 0.8 0.5  PROT 6.6 6.4*  ALBUMIN 3.0* 2.3*     Lipase     Component Value Date/Time   LIPASE 12* 08/06/2014 2020     Studies/Results: No results found.  Medications: . Chlorhexidine Gluconate Cloth  6 each Topical q morning - 10a  . diclofenac sodium  2 g Topical QID  . DULoxetine  60 mg Oral QHS  . enoxaparin (LOVENOX) injection  40 mg Subcutaneous Q24H  . fluticasone  1 spray Each Nare Daily  . gabapentin  400 mg Oral BID  .  methadone  5 mg Oral 3 times per day  . pantoprazole  40 mg Oral BID  . piperacillin-tazobactam (ZOSYN)  IV  3.375 g Intravenous Q8H  . potassium chloride  20 mEq Oral TID  . timolol  1 drop Both Eyes Daily    Assessment/Plan Perforated pyloric ulcer S/p Exploratory laparotomy, with repair of ulcer, 08/04/14,Dr. Johnathan Hausen Severe Arthritis previously on Hospice for this Chronic pain/Methadone use Deconditioning Hypokalemia  K+ 2.4, being replaced by Dr. Hassell Done 08/11/14 Anemia -  8.7/26.9 08/11/14 Hx of CHF Hx of pneumonia Hx of GERD Hx of depression Antibiotics: Day 7 Zosyn completed. DVT: Lovenox/SCD   Plan:  WBC is normal I will check on stopping Zosyn.  K+ being replaced.  Wound needs BID dressing changes, I will get dietician to help with nutrition and multiple small meals. Add probiotic, and MVI with iron for anemia.  I will discuss discharge date, possibly tomorrow with Dr. Dalbert Batman.  Recheck labs in AM.    LOS: 7 days    Monique Russo 08/11/2014

## 2014-08-11 NOTE — Discharge Instructions (Signed)
[facility] Stay Proper nutrition can help your body recover from illness and injury.   Foods and beverages high in protein, vitamins, and minerals help rebuild muscle loss, promote healing, & reduce fall risk.   In addition to eating healthy foods, a nutrition shake is an easy, delicious way to get the nutrition you need during and after your hospital stay  It is recommended that you continue to drink 2 bottles per day of:       Boost Plus for at least 1 month (30 days) after your hospital stay   Tips for adding a nutrition shake into your routine: As allowed, drink one with vitamins or medications instead of water or juice Enjoy one as a tasty mid-morning or afternoon snack Drink cold or make a milkshake out of it Drink one instead of milk with cereal or snacks Use as a coffee creamer   Available at the following grocery stores and pharmacies:           * [facility] [phone]            For COUPONS visit: www.ensure.com/join or http://dawson-may.com/   Suggested Substitutions Ensure Plus = Boost Plus = Carnation Breakfast Essentials = Boost Compact Ensure Active Clear = Boost Breeze Glucerna Shake = Boost Glucose Control = Carnation Breakfast Essentials SUGAR FREE    CCS      [facility], [place] [phone]  OPEN ABDOMINAL SURGERY: POST OP INSTRUCTIONS  Always review your discharge instruction sheet given to you by the facility where your surgery was performed.  IF YOU HAVE DISABILITY OR FAMILY LEAVE FORMS, YOU MUST BRING THEM TO THE OFFICE FOR PROCESSING.  PLEASE DO NOT GIVE THEM TO YOUR DOCTOR.  1. A prescription for pain medication may be given to you upon discharge.  Take your pain medication as prescribed, if needed.  If narcotic pain medicine is not needed, then you may take  acetaminophen (Tylenol) or ibuprofen (Advil) as needed. 2. Take your usually prescribed medications unless otherwise directed. 3. If you need a refill on your pain medication, please contact your pharmacy. They will contact our office to request authorization.  Prescriptions will not be filled after 5pm or on week-ends. 4. You should follow a light diet the first few days after arrival home, such as soup and crackers, pudding, etc.unless your doctor has advised otherwise. A high-fiber, low fat diet can be resumed as tolerated.   Be sure to include lots of fluids daily. Most patients will experience some swelling and bruising on the chest and neck area.  Ice packs will help.  Swelling and bruising can take several days to resolve 5. Most patients will experience some swelling and bruising in the area of the incision. Ice pack will help. Swelling and bruising can take several days to resolve..  6. It is common to experience some constipation if taking pain medication after surgery.  Increasing fluid intake and taking a stool softener will usually help or prevent this problem from occurring.  A mild laxative (Milk of Magnesia or Miralax) should be taken according to package directions if there are no bowel movements after 48 hours. 7.  You may have steri-strips (small skin tapes)  in place directly over the incision.  These strips should be left on the skin for 7-10 days.  If your surgeon used skin glue on the incision, you may shower in 24 hours.  The glue will flake off over the next 2-3 weeks.  Any sutures or staples will be removed at the office during your follow-up visit. You may find that a light gauze bandage over your incision may keep your staples from being rubbed or pulled. You may shower and replace the bandage daily. 8. ACTIVITIES:  You may resume regular (light) daily activities beginning the next day--such as daily self-care, walking, climbing stairs--gradually increasing activities as tolerated.   You may have sexual intercourse when it is comfortable.  Refrain from any heavy lifting or straining until approved by your doctor. a. You may drive when you no longer are taking prescription pain medication, you can comfortably wear a seatbelt, and you can safely maneuver your car and apply brakes b. Return to Work: ___________________________________ 35. You should see your doctor in the office for a follow-up appointment approximately two weeks after your surgery.  Make sure that you call for this appointment within a day or two after you arrive home to insure a convenient appointment time. OTHER INSTRUCTIONS:  _____________________________________________________________ _____________________________________________________________  WHEN TO CALL YOUR DOCTOR: 1. Fever over 101.0 2. Inability to urinate 3. Nausea and/or vomiting 4. Extreme swelling or bruising 5. Continued bleeding from incision. 6. Increased pain, redness, or drainage from the incision. 7. Difficulty swallowing or breathing 8. Muscle cramping or spasms. 9. Numbness or tingling in hands or feet or around lips.  The clinic staff is available to answer your questions during regular business hours.  Please dont hesitate to call and ask to speak to one of the nurses if you have concerns.  For further questions, please visit [email or web address]  Dressing Change A dressing is a material placed over wounds. It keeps the wound clean, dry, and protected from further injury.  BEFORE YOU BEGIN  Get your supplies together. Things you may need include:  Salt solution (saline).  You can use 4 x 4's,    PaperTape.  Gloves.  Belly (abdominal) pads You can use dry 4 x 4's to cover site after packing.  Gauze squares.  Plastic bags.  Take pain medicine 30 minutes before the bandage change if you need it.  Take a shower before you do the first bandage change of the day. Put plastic wrap or a bag over the  dressing. REMOVING YOUR OLD BANDAGE  Wash your hands with soap and water. Dry your hands with a clean towel.  Put on your gloves.  Remove any tape.  Remove the old bandage as told. If it sticks, put a small amount of warm water on it to loosen the bandage.  Remove any gauze or packing tape in your wound.  Take off your gloves.  Put the gloves, tape, gauze, or any packing tape in a plastic bag. CHANGING YOUR BANDAGE  Open the supplies.  Take the cap off the salt solution.  Open the gauze. Leave the gauze on the inside of the package.  Put on your gloves.  Clean your wound as told by your doctor.  Keep your wound dry if your doctor told you to do so.  Your doctor may tell you to do one or more of the following:  Pick up the gauze. Pour the salt solution over the gauze. Squeeze out the extra salt solution.  Put medicated cream  or other medicine on your wound.  Put solution soaked gauze only in your wound, not on the skin around it.  Pack your wound loosely.  Put dry gauze on your wound.  Put belly pads over the dry gauze if your bandages soak through.  Tape the bandage in place so it will not fall off. Do not wrap the tape all the way around your arm or leg.  Wrap the bandage with the flexible gauze bandage as told by your doctor.  Take off your gloves. Put them in the plastic bag with the old bandage. Tie the bag shut and throw it away.  Keep the bandage clean and dry.  Wash your hands. GET HELP RIGHT AWAY IF:   Your skin around the wound looks red.  Your wound feels more tender or sore.  You see yellowish-white fluid (pus) in the wound.  Your wound smells bad.  You have a fever.  Your skin around the wound has a red rash that itches and burns.  You see black or yellow skin in your wound that was not there before.  You feel sick to your stomach (nauseous), throw up (vomit), and feel very tired. Document Released: 06/02/2008 Document Revised:  07/21/2013 Document Reviewed: 01/15/2011 [facility] Patient Information 2015 [place], [place]. This information is not intended to replace advice given to you by your health care provider. Make sure you discuss any questions you have with your health care provider.

## 2014-08-11 NOTE — Progress Notes (Signed)
CRITICAL VALUE ALERT  Critical value received:  K+ 2.4  Date of notification:  08/11/2014  Time of notification:  2595  Critical value read back:Yes.    Nurse who received alert:  Felicity Coyer  MD notified (1st page):  Rockne Coons  Time of first page:  06:15  MD notified (2nd page):  Time of second page:  Responding MD:  Rockne Coons  Time MD responded:  06:17

## 2014-08-11 NOTE — Care Management Note (Signed)
Case Management Note  Patient Details  Name: Monique Russo MRN: [id number] Date of Birth: [birth year]  Subjective/Objective:             S/p Exploratory laparotomy, with repair of ulcer, 08/04/14       Action/Plan: Discharge planning  Expected Discharge Date:   (UNKNOWN)               Expected Discharge Plan:  [place]  In-House Referral:  Clinical Social Work  Discharge planning Services  CM Consult  Post Acute Care Choice:  NA Choice offered to:  NA  DME Arranged:    DME Agency:     HH Arranged:    [place] Agency:     Status of Service:  Completed, signed off  Medicare Important Message Given:  Yes Date Medicare IM Given:  08/11/14 Medicare IM give by:  Sunday Spillers RN CM  Date Additional Medicare IM Given:    Additional Medicare Important Message give by:     If discussed at [place] of Stay Meetings, dates discussed:    Additional Comments:  Guadalupe Maple, RN 08/11/2014, 1:26 PM

## 2014-08-11 NOTE — Clinical Social Work Placement (Signed)
CSW received call from patient's insurance - [employer] that [facility] is not yet in network with them. CSW checked with [facility] & confirmed that they would have a bed available for patient. CSW spoke with patient's husband, Monique Russo who accepted bed at [facility] and will do admission paperwork with them this afternoon. Anticipating discharge tomorrow, Wednesday 5/25.   *MD: please sign FL2 on front of shadow chart.     Raynaldo Opitz, [facility] Clinical Social Worker cell #: [phone]    CLINICAL SOCIAL WORK PLACEMENT  NOTE  Date:  08/11/2014  Patient Details  Name: Monique Russo MRN: [id number] Date of Birth: 08-20-1930  Clinical Social Work is seeking post-discharge placement for this patient at the [place] level of care (*CSW will initial, date and re-position this form in  chart as items are completed):  Yes   Patient/family provided with [facility]'s list of facilities offering this level of care within the geographic area requested by the patient (or if unable, by the patient's family).  Yes   Patient/family informed of their freedom to choose among providers that offer the needed level of care, that participate in Medicare, Medicaid or managed care program needed by the patient, have an available bed and are willing to accept the patient.  Yes   Patient/family informed of [facility]'s ownership interest in [facility] and [facility], as well as of the fact that they are under no obligation to receive care at these facilities.  PASRR submitted to EDS on 08/10/14     PASRR number received on 08/10/14     Existing PASRR number confirmed on       FL2 transmitted to all facilities in geographic area requested by pt/family on 08/10/14     FL2 transmitted to all facilities within larger geographic area on       Patient informed that his/her managed care company has contracts with  or will negotiate with certain facilities, including the following:        Yes   Patient/family informed of bed offers received.  Patient chooses bed at [facility]     Physician recommends and patient chooses bed at      Patient to be transferred to [facility] on  .  Patient to be transferred to facility by       Patient family notified on   of transfer.  Name of family member notified:        PHYSICIAN       Additional Comment:    _______________________________________________ Standley Brooking, LCSW 08/11/2014, 9:31 AM

## 2014-08-11 NOTE — Discharge Summary (Signed)
Physician Discharge Summary  Patient ID: Monique Russo MRN: [id number] DOB/AGE: April 24, 1930 81 y.o.  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 Hassell Done 08/11/14 Anemia - 8.7/26.9 08/11/14 Hx of CHF  (hospitalized 4/29-07/20/14 with acute respiratory failure, pneumonia, combined systolic and diastolic CHF, EF 40%)  Hx of pneumonia Hx of GERD Hx of depression  Principal Problem:   Perforated chronic gastric ulcer Active Problems:   Acute respiratory failure with hypoxia   Lobar pneumonia   Acute renal failure   Encounter for intubation   Ventilator dependence   PROCEDURES: S/p Exploratory laparotomy, with repair of ulcer, 08/04/14,Dr. Lynn Ito Course: This is an 81 yo white female who has a history of CHF, actually apparently was on hospice for this last year per palliative care's note but she came off of this in September. Her husband states today she was just diagnosed with CHF a couple of weeks ago when she was admitted for PNA. She has not seen a cardiologist and was supposed to see Dr. Sallyanne Kuster, but has not had her appointment yet. She also has chronic back pain and has a history of narcotic use. On Sunday, she started having some RUQ abdominal pain. Her husband brought her to the ED and they felt this may be from her gallstones and she was encouraged to follow up with [place] for further evaluation. She was having some nausea, but no real  emesis. Her pain persisted and worsened yesterday. She has been having chills and sweats. She had a BM 2 days ago that was normal with no blood. She denies use of NSAIDs. She was brought back to the [facility] today for continued abdominal pain. She was found to have perforated viscus with pneumoperitoneum on her scan today. Site is unable to be located, but there is some gastric antral thickening noted. Her WBC is 20K and creatinine is 1.6. We have been asked to see her for admission. She was seen and admitted by Dr. Excell Seltzer who took her to the OR later after CT confirmed free air consistent with a perforated viscus.  With her very deconditioned state this was a very high risk procedure.  Post op she required Ventilator support for her over night and was extubated the following day.  Post op pain was an ongoing issue, as was her immobility.  Her diet was slowly advanced, she used allot of pain medications and was very lethargic post op. She had no growth on her intraabdominal cultures, urine culture, C diff was negative and her H pylori stool is also negative.  Her diet was advanced, PT recommended SNF, and her husband did not feel he could care for there at home by himself.   She was kept in isolation for MRSA nasal swab positive.  We have begun arrangements for post op care and family is accepting SNF placement at this time.  She is on a soft diet, no significant new pain, but ongoing chronic pain, she is fairly deconditioned, and her nutrition is poor.  She is eating some but food still does not taste good to  her.  It take 2 to get her OOB and she is walking to BR/bedside commode, but has not made it to the halls. Open abdominal wound is healing well she is tolerating a soft diet and having bowel movements.  We have her back on her preadmission Methadone, percocet, Cymbalta, Voltaren gel, and Neurontin.  Her potassium is replaced, her H/H is improving, and her wound looks good.   CBC Latest Ref Rng  08/12/2014 08/11/2014 08/06/2014  WBC 4.0 - 10.5 K/uL 8.8 9.6 12.7(H)  Hemoglobin 12.0 - 15.0 g/dL 10.4(L) 8.7(L) 10.1(L)  Hematocrit 36.0 - 46.0 % 31.5(L) 26.9(L) 31.4(L)  Platelets 150 - 400 K/uL PLATELET CLUMPS NOTED ON SMEAR, COUNT APPEARS ADEQUATE 339 374   CMP Latest Ref Rng 08/12/2014 08/11/2014 08/06/2014  Glucose 65 - 99 mg/dL 100(H) 111(H) 125(H)  BUN 6 - 20 mg/dL <5(L) <5(L) 11  Creatinine 0.44 - 1.00 mg/dL 0.45 0.62 0.68  Sodium 135 - 145 mmol/L 137 138 134(L)  Potassium 3.5 - 5.1 mmol/L 4.4 2.4(LL) 4.4  Chloride 101 - 111 mmol/L 106 103 103  CO2 22 - 32 mmol/L 24 27 23   Calcium 8.9 - 10.3 mg/dL 8.5(L) 8.1(L) 8.9  Total Protein 6.5 - 8.1 g/dL - - 6.4(L)  Total Bilirubin 0.3 - 1.2 mg/dL - - 0.5  Alkaline Phos 38 - 126 U/L - - 87  AST 15 - 41 U/L - - 18  ALT 14 - 54 U/L - - 16   Condition on d/c:  Improved       Disposition: SNF      Discharge Instructions    Change dressing    Complete by:  As directed   BID wet to dry dressings using normal saline and paper tape to protect skin.            Medication List    STOP taking these medications        diclofenac 50 MG EC tablet  Commonly known as:  VOLTAREN      TAKE these medications        acetaminophen 325 MG tablet  Commonly known as:  TYLENOL  Take 325 mg by mouth every 6 (six) hours as needed for mild pain or headache.     b complex vitamins tablet  Take 1 tablet by mouth daily as needed.     bisacodyl 10 MG suppository  Commonly known as:  DULCOLAX  Place 1 suppository (10 mg total) rectally daily as needed for moderate constipation (May repeat times one).     budesonide 32 MCG/ACT nasal spray  Commonly known as:  RHINOCORT AQUA  Place 1 spray into both nostrils daily as needed for rhinitis.     cholecalciferol 1000 UNITS tablet  Commonly known as:  VITAMIN D  Take 1,000 Units by mouth daily.     diclofenac sodium 1 % Gel  Commonly known as:  VOLTAREN  Apply topically every three (3) days  as needed (For Leg pain). 1 gm     docusate sodium 100 MG capsule  Commonly known as:  COLACE  Take 1 capsule (100 mg total) by mouth 3 (three) times daily as needed.     DULoxetine 60 MG capsule  Commonly known as:  CYMBALTA  Take 60 mg by mouth daily. At bedtime     Fish Oil 1200 MG Caps  Take 2 capsules by mouth daily.     furosemide 80 MG tablet  Commonly known as:  LASIX  Take 80 mg by mouth  daily as needed for fluid.     gabapentin 400 MG capsule  Commonly known as:  NEURONTIN  Take 400 mg by mouth 2 (two) times daily.     lactose free nutrition Liqd  Take 237 mLs by mouth 2 (two) times daily between meals.     magnesium hydroxide 400 MG/5ML suspension  Commonly known as:  MILK OF MAGNESIA  Take 30 mLs by mouth every other day. As needed for constipation     methadone 5 MG tablet  Commonly known as:  DOLOPHINE  Take 5 mg by mouth every 8 (eight) hours.     methocarbamol 750 MG tablet  Commonly known as:  ROBAXIN  Take 750 mg by mouth every 8 (eight) hours as needed for muscle spasms (Prescribed TID but patient takes PRN).     multivitamins with iron Tabs tablet  Take 1 tablet by mouth daily with supper.     oxyCODONE-acetaminophen 10-325 MG per tablet  Commonly known as:  PERCOCET  Take 1 tablet by mouth every 8 (eight) hours as needed for pain.     pantoprazole 40 MG tablet  Commonly known as:  PROTONIX  Take 1 tablet (40 mg total) by mouth 2 (two) times daily.     potassium chloride SA 20 MEQ tablet  Commonly known as:  K-DUR,KLOR-CON  Take 20 mEq by mouth daily as needed (Only takes with lasix).     saccharomyces boulardii 250 MG capsule  Commonly known as:  FLORASTOR  Take 1 capsule (250 mg total) by mouth 2 (two) times daily.     temazepam 30 MG capsule  Commonly known as:  RESTORIL  Take 1 capsule (30 mg total) by mouth at bedtime as needed for sleep.     timolol 0.5 % ophthalmic solution  Commonly known as:  BETIMOL  Place 1 drop into both  eyes daily.     vitamin C 1000 MG tablet  Take 1,000 mg by mouth daily.     vitamin E 400 UNIT capsule  Take 400 Units by mouth daily.       Follow-up Information    Follow up with Tommy Medal, MD.   Specialty:  Internal Medicine   Why:  Call for follow up and chronic pain management before you leave SNF.  Follow up on your ulcer and other medical issues.   Contact information:   [address], [place] [address] [phone]       Follow up with Johnathan Hausen B, MD. Schedule an appointment as soon as possible for a visit in 3 weeks.   Specialty:  General Surgery   Why:  Continue wet to dry dressing to open abdominal wound twice a day with sterile saline.   Contact information:   [address] [place] [address] [phone]       Signed: Earnstine Regal 08/12/2014, 8:46 AM

## 2014-08-11 NOTE — Progress Notes (Signed)
Physical Therapy Treatment Patient Details Name: Monique Russo MRN: [id number] DOB: October 09, 1930 Today's Date: 08/11/2014    History of Present Illness 81 yo female admitted with hemoptysis, perforated ulcer, s/p exp lap on 08/04/14; PMhx of CHF, arthritis, chronic back pain, R shoulder surgery    PT Comments    Assisted pt OOB to [facility] + 2 assit for safety.  Amb in hallway + 2 assist with MAX encouragement to increase amb distance.  Pt demonstrates increased anxiety and multiple reasons/excuses.    Follow Up Recommendations  Supervision/Assistance - 24 hour;SNF  [facility]   Equipment Recommendations  None recommended by PT    Recommendations for Other Services       Precautions / Restrictions Precautions Precautions: Fall Restrictions Weight Bearing Restrictions: No    Mobility  Bed Mobility   Bed Mobility: Supine to Sit     Supine to sit: Min assist     General bed mobility comments: increased time  Transfers Overall transfer level: Needs assistance Equipment used: Rolling walker (2 wheeled) Transfers: Sit to/from Stand Sit to Stand: Mod assist;From elevated surface         General transfer comment: 75% VC's on proper tech and hand placement esp turn completion prior to sit.    Ambulation/Gait Ambulation/Gait assistance: +2 safety/equipment;Min assist Ambulation Distance (Feet): 22 Feet (11 feet x 2) Assistive device: Rolling walker (2 wheeled) Gait Pattern/deviations: Step-to pattern;Step-through pattern;Trunk flexed Gait velocity: decreased   General Gait Details: multi-modal cues for RW position, participation and overall safety;   MAX encouragement to increase gait distance. Pt demonstrates multiple excuses and is self limiting. tends to push walker too far to front.   Stairs            Wheelchair Mobility    Modified Rankin (Stroke Patients Only)       Balance                                    Cognition  Arousal/Alertness: Awake/alert Behavior During Therapy: WFL for tasks assessed/performed Overall Cognitive Status: Within Functional Limits for tasks assessed Area of Impairment: Following commands;Problem solving;Safety/judgement       Following Commands: Follows one step commands with increased time Safety/Judgement: Decreased awareness of deficits   Problem Solving: Decreased initiation;Requires verbal cues;Requires tactile cues General Comments: pt has multiple excuses for her self mobility    Exercises      General Comments        Pertinent Vitals/Pain Pain Assessment: Faces Faces Pain Scale: Hurts little more Pain Location: headache    Home Living                      Prior Function            PT Goals (current goals can now be found in the care plan section) Progress towards PT goals: Progressing toward goals    Frequency  Min 3X/week    PT Plan      Co-evaluation             End of Session Equipment Utilized During Treatment: Gait belt Activity Tolerance: Treatment limited secondary to medical complications (Comment) (self limiting) Patient left: in chair;with call bell/phone within reach;with chair alarm set;with family/visitor present     Time: 1340-1405 PT Time Calculation (min) (ACUTE ONLY): 25 min  Charges:  $Gait Training: 8-22 mins $Therapeutic Activity: 8-22 mins  G Codes:      Rica Koyanagi  PTA [facility]  Acute  Rehab Pager      [phone]

## 2014-08-11 NOTE — Progress Notes (Signed)
Initial Nutrition Assessment  DOCUMENTATION CODES:  Obesity unspecified  INTERVENTION:  Provide Boost Plus BID, each provides 360 kcal and 14g of protein Provide daily snacks (10am, 2pm, 8pm) Encourage PO intake (small, frequent meals) RD to continue to monitor  NUTRITION DIAGNOSIS:  Inadequate oral intake related to other (see comment) (difficulty chewing) as evidenced by meal completion < 50%.  GOAL:  Patient will meet greater than or equal to 90% of their needs  MONITOR:  PO intake, Supplement acceptance, Labs, Weight trends, Skin, I & O's  REASON FOR ASSESSMENT:  Consult Assessment of nutrition requirement/status  ASSESSMENT: 81 yo white female who has a history of CHF, apparently was on hospice for this last year per palliative care's note but she came off of this in September.  S/p Exploratory laparotomy, with repair of ulcer, 08/04/14.  Pt in room with daughter at bedside. Per daughter, pt ate a few bites of her eggs and french toast this morning. PO intake: 30%.  Per MD, pt recommended to eat small frequent meals. RD ordered daily snacks for patient. Pt reports drinking 2 Boosts at home. RD to order supplements as well. Per weight history documentation, pt with weight gain since January 2016.   Nutrition focused physical exam shows no sign of depletion of muscle mass or body fat.  Labs reviewed:  Low K, BUN, Mg    Height:  Ht Readings from Last 1 Encounters:  08/04/14 5' (1.524 m)    Weight:  Wt Readings from Last 1 Encounters:  08/06/14 197 lb 1.5 oz (89.4 kg)    Ideal Body Weight:  45.5 kg  Wt Readings from Last 10 Encounters:  08/06/14 197 lb 1.5 oz (89.4 kg)  08/02/14 185 lb (83.915 kg)  07/20/14 187 lb 3.2 oz (84.913 kg)  04/13/14 183 lb (83.008 kg)  12/04/13 176 lb (79.833 kg)  07/04/13 192 lb 7.4 oz (87.3 kg)    BMI:  Body mass index is 38.49 kg/(m^2).  Estimated Nutritional Needs:  Kcal:  1400-1600  Protein:   60-70g  Fluid:  1.5L/day    Skin:  Wound (see comment) (abdominal incision)  Diet Order:  DIET SOFT Room service appropriate?: Yes; Fluid consistency:: Thin  EDUCATION NEEDS:  No education needs identified at this time   Intake/Output Summary (Last 24 hours) at 08/11/14 1109 Last data filed at 08/11/14 0738  Gross per 24 hour  Intake 3092.5 ml  Output    800 ml  Net 2292.5 ml    Last BM:  5/23  Clayton Bibles, MS, RD, LDN Pager: [phone] After Hours Pager: [phone]

## 2014-08-11 NOTE — Progress Notes (Signed)
ANTIBIOTIC CONSULT NOTE - FOLLOW UP  Pharmacy Consult for zosyn  Indication: intra-abdominal infection, sepsis  No Known Allergies  Patient Measurements: Height: 5' (152.4 cm) Weight: 197 lb 1.5 oz (89.4 kg) IBW/kg (Calculated) : 45.5   Vital Signs: Temp: 97.3 F (36.3 C) (05/24 0650) Temp Source: Axillary (05/24 0650) BP: 112/72 mmHg (05/24 0650) Pulse Rate: 80 (05/24 0650) Intake/Output from previous day: 05/23 0701 - 05/24 0700 In: 2415 [P.O.:420; I.V.:1995] Out: 800 [Urine:800] Intake/Output from this shift:    Labs:  Recent Labs  08/11/14 0502  WBC 9.6  HGB 8.7*  PLT 339  CREATININE 0.62   Estimated Creatinine Clearance: 54.9 mL/min (by C-G formula based on Cr of 0.62). No results for input(s): VANCOTROUGH, VANCOPEAK, VANCORANDOM, GENTTROUGH, GENTPEAK, GENTRANDOM, TOBRATROUGH, TOBRAPEAK, TOBRARND, AMIKACINPEAK, AMIKACINTROU, AMIKACIN in the last 72 hours.   Microbiology: Recent Results (from the past 720 hour(s))  Blood culture (routine x 2)     Status: None   Collection Time: 07/17/14  6:22 PM  Result Value Ref Range Status   Specimen Description BLOOD RIGHT HAND  Final   Special Requests BOTTLES DRAWN AEROBIC ONLY 5ML  Final   Culture   Final    NO GROWTH 5 DAYS Performed at [facility]    Report Status 07/24/2014 FINAL  Final  Blood culture (routine x 2)     Status: None   Collection Time: 07/17/14  6:23 PM  Result Value Ref Range Status   Specimen Description BLOOD RIGHT ARM  Final   Special Requests BOTTLES DRAWN AEROBIC AND ANAEROBIC 5CC  Final   Culture   Final    NO GROWTH 5 DAYS Performed at [facility]    Report Status 07/24/2014 FINAL  Final  Culture, Urine     Status: None   Collection Time: 07/17/14 10:46 PM  Result Value Ref Range Status   Specimen Description URINE, CATHETERIZED  Final   Special Requests NONE  Final   Colony Count NO GROWTH Performed at [facility]   Final   Culture NO  GROWTH Performed at [facility]   Final   Report Status 07/18/2014 FINAL  Final  Culture, sputum-assessment     Status: None   Collection Time: 07/19/14  2:41 AM  Result Value Ref Range Status   Specimen Description SPUTUM  Final   Special Requests NONE  Final   Sputum evaluation   Final    THIS SPECIMEN IS ACCEPTABLE. RESPIRATORY CULTURE REPORT TO FOLLOW.   Report Status 07/19/2014 FINAL  Final  Culture, respiratory (NON-Expectorated)     Status: None   Collection Time: 07/19/14  2:41 AM  Result Value Ref Range Status   Specimen Description SPUTUM  Final   Special Requests NONE  Final   Gram Stain   Final    MODERATE WBC PRESENT,BOTH PMN AND MONONUCLEAR RARE SQUAMOUS EPITHELIAL CELLS PRESENT MODERATE GRAM POSITIVE COCCI IN PAIRS IN CLUSTERS IN CHAINS Performed at [facility]    Culture   Final    NORMAL OROPHARYNGEAL FLORA Performed at [facility]    Report Status 07/21/2014 FINAL  Final  Blood Culture (routine x 2)     Status: None   Collection Time: 08/04/14  1:47 PM  Result Value Ref Range Status   Specimen Description BLOOD BLOOD RIGHT FOREARM  Final   Special Requests BOTTLES DRAWN AEROBIC ONLY 0.5 CC  Final   Culture   Final    NO GROWTH 5 DAYS Performed at  [facility]    Report Status 08/10/2014 FINAL  Final  Blood Culture (routine x 2)     Status: None   Collection Time: 08/04/14  1:47 PM  Result Value Ref Range Status   Specimen Description BLOOD RIGHT ANTECUBITAL  Final   Special Requests   Final    BOTTLES DRAWN AEROBIC AND ANAEROBIC 4.5 CC BLUE,5 CC RED   Culture   Final    NO GROWTH 5 DAYS Performed at [facility]    Report Status 08/10/2014 FINAL  Final  Anaerobic culture     Status: None   Collection Time: 08/04/14  6:09 PM  Result Value Ref Range Status   Specimen Description PERITONEAL  Final   Special Requests PATIENT ON FOLLOWING VANCOMYCIN AND ZOSYN  Final   Gram Stain   Final    FEW WBC  PRESENT,BOTH PMN AND MONONUCLEAR NO ORGANISMS SEEN Performed at [facility]    Culture   Final    NO ANAEROBES ISOLATED Performed at [facility]    Report Status 08/09/2014 FINAL  Final  Body fluid culture     Status: None   Collection Time: 08/04/14  6:09 PM  Result Value Ref Range Status   Specimen Description PERITONEAL  Final   Special Requests PATIENT ON FOLLOWING VANCOMYCIN AND ZOSYN  Final   Gram Stain   Final    FEW WBC PRESENT,BOTH PMN AND MONONUCLEAR NO ORGANISMS SEEN Performed at [facility]    Culture   Final    NO GROWTH 3 DAYS Performed at [facility]    Report Status 08/08/2014 FINAL  Final  MRSA PCR Screening     Status: Abnormal   Collection Time: 08/04/14  7:58 PM  Result Value Ref Range Status   MRSA by PCR POSITIVE (A) NEGATIVE Final    Comment:        The GeneXpert MRSA Assay (FDA approved for NASAL specimens only), is one component of a comprehensive MRSA colonization surveillance program. It is not intended to diagnose MRSA infection nor to guide or monitor treatment for MRSA infections. RESULT CALLED TO, READ BACK BY AND VERIFIED WITH: WELCH,A/2W @2218  ON 08/04/14 BY KARCZEWSKI,S.   Clostridium Difficile by PCR     Status: None   Collection Time: 08/08/14  7:52 PM  Result Value Ref Range Status   C difficile by pcr NEGATIVE NEGATIVE Final    Assessment: Patient's an 81 y.o F with perforated viscus with pneumoperitoneum s/p ex lap w/repair of perforated ulcer, currently on Zosyn day #8 for broad coverage.  - Afebrile, WBC now WNL, SCr stable  5/17 >> Vancomycin >> 5/17 5/17 >> Zosyn >>    5/17 blood x 2: NGF 5/17 anareobic peritoneal: NGF 5/17 peritoneal fluid: NGF 5/17 MRSA PCR: positive 5/21 C.diff PCR: negative 5/23 H.pylori antigen: sent  Plan:  - Continue Zosyn 3.375 gm IV q8h (infuse over 4 hours) - F/u renal function, cultures, clinical course. - F/u duration of therapy.   Lindell Spar, PharmD, BCPS Pager: [phone] 08/11/2014 7:13 AM

## 2014-08-12 LAB — CBC
HCT: 31.5 % — ABNORMAL LOW (ref 36.0–46.0)
HEMOGLOBIN: 10.4 g/dL — AB (ref 12.0–15.0)
MCH: 32.2 pg (ref 26.0–34.0)
MCHC: 33 g/dL (ref 30.0–36.0)
MCV: 97.5 fL (ref 78.0–100.0)
Platelets: ADEQUATE 10*3/uL (ref 150–400)
RBC: 3.23 MIL/uL — ABNORMAL LOW (ref 3.87–5.11)
RDW: 13.4 % (ref 11.5–15.5)
WBC: 8.8 10*3/uL (ref 4.0–10.5)

## 2014-08-12 LAB — BASIC METABOLIC PANEL
ANION GAP: 7 (ref 5–15)
BUN: <5 mg/dL — ABNORMAL LOW (ref 6–20)
CALCIUM: 8.5 mg/dL — AB (ref 8.9–10.3)
CO2: 24 mmol/L (ref 22–32)
Chloride: 106 mmol/L (ref 101–111)
Creatinine, Ser: 0.45 mg/dL (ref 0.44–1.00)
GFR calc Af Amer: >60 mL/min (ref >60)
Glucose, Bld: 100 mg/dL — ABNORMAL HIGH (ref 65–99)
Potassium: 4.4 mmol/L (ref 3.5–5.1)
SODIUM: 137 mmol/L (ref 135–145)

## 2014-08-12 LAB — MAGNESIUM: MAGNESIUM: 2.1 mg/dL (ref 1.7–2.4)

## 2014-08-12 MED ORDER — BOOST PLUS PO LIQD: 237.0000 mL | Freq: Two times a day (BID) | ORAL | Status: DC

## 2014-08-12 MED ORDER — PANTOPRAZOLE SODIUM 40 MG PO TBEC: 40.0000 mg | DELAYED_RELEASE_TABLET | Freq: Two times a day (BID) | ORAL | Status: DC

## 2014-08-12 MED ORDER — SACCHAROMYCES BOULARDII 250 MG PO CAPS
250.0000 mg | ORAL_CAPSULE | Freq: Two times a day (BID) | ORAL | Status: DC
Start: 2014-08-12 — End: 2014-11-26

## 2014-08-12 MED ORDER — TAB-A-VITE/IRON PO TABS: 1.0000 | ORAL_TABLET | Freq: Every day | ORAL | Status: DC

## 2014-08-12 NOTE — Progress Notes (Signed)
8 Days Post-Op  Subjective: She is sleeping but wake up easily, no real complaints, no SOB, she gets a little winded getting up OOB to Commode.  No complaints of pain right now.    Objective: Vital signs in last 24 hours: Temp:  [97.4 F (36.3 C)-98.6 F (37 C)] 97.4 F (36.3 C) (05/25 0545) Pulse Rate:  [82-83] 83 (05/25 0545) Resp:  [18] 18 (05/25 0545) BP: (122-133)/(55-72) 130/68 mmHg (05/25 0545) SpO2:  [93 %-97 %] 97 % (05/25 0545) Last BM Date: 08/11/14 960 PO recorded Soft diet 2 stools Afebrile, VSS Labs OK, K+ up to 4.4  Mag up to 2.1 H/H is better Intake/Output from previous day: 05/24 0701 - 05/25 0700 In: 3315 [P.O.:960; I.V.:2355] Out: 2200 [Urine:2200] Intake/Output this shift:    General appearance: alert, cooperative and no distress Resp: clear to auscultation bilaterally GI: soft, non-tender; bowel sounds normal; no masses,  no organomegaly and open wound is clean, with good granular tissue at the base and sides. Extremities: extremities normal, atraumatic, no cyanosis or edema  Lab Results:   Recent Labs  08/11/14 0502 08/12/14 0505  WBC 9.6 8.8  HGB 8.7* 10.4*  HCT 26.9* 31.5*  PLT 339 PLATELET CLUMPS NOTED ON SMEAR, COUNT APPEARS ADEQUATE    BMET  Recent Labs  08/11/14 0502 08/12/14 0505  NA 138 137  K 2.4* 4.4  CL 103 106  CO2 27 24  GLUCOSE 111* 100*  BUN <5* <5*  CREATININE 0.62 0.45  CALCIUM 8.1* 8.5*   PT/INR No results for input(s): LABPROT, INR in the last 72 hours.   Recent Labs Lab 08/06/14 2020  AST 18  ALT 16  ALKPHOS 87  BILITOT 0.5  PROT 6.4*  ALBUMIN 2.3*     Lipase     Component Value Date/Time   LIPASE 12* 08/06/2014 2020     Studies/Results: No results found.  Medications: . Chlorhexidine Gluconate Cloth  6 each Topical q morning - 10a  . diclofenac sodium  2 g Topical QID  . DULoxetine  60 mg Oral QHS  . enoxaparin (LOVENOX) injection  40 mg Subcutaneous Q24H  . fluticasone  1 spray Each  Nare Daily  . gabapentin  400 mg Oral BID  . lactose free nutrition  237 mL Oral BID BM  . methadone  5 mg Oral 3 times per day  . multivitamins with iron  1 tablet Oral Q supper  . pantoprazole  40 mg Oral BID  . potassium chloride  30 mEq Oral TID PC & HS  . saccharomyces boulardii  250 mg Oral BID  . timolol  1 drop Both Eyes Daily    Assessment/Plan Perforated pyloric ulcer Severe Arthritis previously on Hospice for this Chronic pain/Methadone use Deconditioning Hypokalemia resolved Anemia - improving Hx of CHF (hospitalized 4/29-07/20/14 with acute respiratory failure, pneumonia, combined systolic and diastolic CHF, EF 93%)  Hx of pneumonia Hx of GERD Hx of depression Antibiotics: Day 7 Zosyn completed. DVT: Lovenox/SCD   Plan:  Discharge to SNF today, dressing changes BID, with follow up with Dr. Hassell Done in 3 weeks. Dr. Minna Antis is her PCP and where she gets her chronic medicines, she will follow up with his office in 2 weeks also.     LOS: 8 days    Monique Russo 08/12/2014

## 2014-08-12 NOTE — Progress Notes (Signed)
08/12/14 1100  Reviewed discharge instructions with patient. Patient verbalized understanding of discharge instructions. Copy of discharge instructions were given to husband along with list of medicines per husbands request.

## 2014-08-12 NOTE — Clinical Social Work Placement (Signed)
Patient is set to discharge to [facility] today. Patient & husband, [facility] aware. Discharge packet given to RN, Dekina. Husband to transport to SNF around 1:30pm.    Raynaldo Opitz, [place] Social Worker cell #: [phone]    CLINICAL SOCIAL WORK PLACEMENT  NOTE  Date:  08/12/2014  Patient Details  Name: Monique Russo MRN: [id number] Date of Birth: May 11, 1930  Clinical Social Work is seeking post-discharge placement for this patient at the [place] level of care (*CSW will initial, date and re-position this form in  chart as items are completed):  Yes   Patient/family provided with [facility] Work Department's list of facilities offering this level of care within the geographic area requested by the patient (or if unable, by the patient's family).  Yes   Patient/family informed of their freedom to choose among providers that offer the needed level of care, that participate in Medicare, Medicaid or managed care program needed by the patient, have an available bed and are willing to accept the patient.  Yes   Patient/family informed of [facility]'s ownership interest in [facility] and [facility], as well as of the fact that they are under no obligation to receive care at these facilities.  PASRR submitted to EDS on 08/10/14     PASRR number received on 08/10/14     Existing PASRR number confirmed on       FL2 transmitted to all facilities in geographic area requested by pt/family on 08/10/14     FL2 transmitted to all facilities within larger geographic area on       Patient informed that his/her managed care company has contracts with or will negotiate with certain facilities, including the following:        Yes   Patient/family informed of bed offers received.  Patient chooses bed at [facility]     Physician recommends and patient chooses bed at      Patient to be  transferred to [facility] on 08/12/14.  Patient to be transferred to facility by husband's car     Patient family notified on 08/12/14 of transfer.  Name of family member notified:  patient's husband, Monique Russo via phone     PHYSICIAN       Additional Comment:    _______________________________________________ Standley Brooking, LCSW 08/12/2014, 10:01 AM

## 2014-08-13 NOTE — Discharge Summary (Signed)
I made a mistake and told family to follow up with Dr. Hassell Done, it is part of the DC instructions.  She should follow up with Dr. Excell Seltzer who did her surgery.   I have ask the office staff to correct this error and to set her up with an appointment with Dr. Excell Seltzer.  I have contacted Mr. Birnbaum and told him of my mistake and he is aware.  She was doing OK last PM when he left SNF.  [employer]

## 2014-09-09 ENCOUNTER — Ambulatory Visit: Payer: Self-pay | Admitting: Cardiovascular Disease

## 2014-11-19 NOTE — Patient Outreach (Signed)
[facility]) Care Management  11/19/2014  Zaeda Mcferran [birth year] [id number]   Referral from HTA tier 4 list, assigned to Dannielle Huh, [facility] for patient outreach.  Kamoria Lucien L. Efstathios Sawin, [place] Care Management Assistant

## 2014-11-26 ENCOUNTER — Ambulatory Visit (INDEPENDENT_AMBULATORY_CARE_PROVIDER_SITE_OTHER): Payer: Medicare Other | Admitting: Cardiovascular Disease

## 2014-11-26 ENCOUNTER — Other Ambulatory Visit: Payer: Self-pay | Admitting: *Deleted

## 2014-11-26 ENCOUNTER — Encounter: Payer: Self-pay | Admitting: Cardiovascular Disease

## 2014-11-26 VITALS — BP 105/67 | HR 85 | Ht 60.0 in | Wt 183.1 lb

## 2014-11-26 DIAGNOSIS — I272 Other secondary pulmonary hypertension: Secondary | ICD-10-CM

## 2014-11-26 DIAGNOSIS — I519 Heart disease, unspecified: Secondary | ICD-10-CM | POA: Diagnosis not present

## 2014-11-26 DIAGNOSIS — I2721 Secondary pulmonary arterial hypertension: Secondary | ICD-10-CM

## 2014-11-26 DIAGNOSIS — R609 Edema, unspecified: Secondary | ICD-10-CM | POA: Diagnosis not present

## 2014-11-26 NOTE — Patient Instructions (Signed)
Your physician recommends that you schedule a follow-up appointment As Neeeded

## 2014-11-26 NOTE — Patient Outreach (Signed)
Monique Russo) Care Management  11/26/2014  Monique Russo [birth year] [id number]   Assessment: Telephone screening Patient is a [employer] referral to engage patient with [facility] care management services.  Call placed to patient but a man answered who identified self as Monique Russo (husband) and informed care management coordinator that patient is asleep at this time. Left name and contact number to call back when patient is available.  Plan: Will await call back. If unable to receive a call back, will schedule patient for next outreach call.  Monique Russo, BSN, RN-BC Page Management Coordinator Cell: [phone]

## 2014-11-27 NOTE — Progress Notes (Signed)
Patient ID: Caydee Talkington, female   DOB: [birth year], 81 y.o.   MRN: [id number]     Cardiology Office Note   Date:  11/28/2014   ID:  Kelley Polinsky, DOB [birth year], MRN [id number]  PCP:  Horatio Pel, MD  Cardiologist:   Sanda Klein, MD   Chief Complaint  Patient presents with  . New Evaluation    hospital follow up (May) for heart failure; complains of shortness of breath; complains of leg swelling; complains of pain in legs when walking that goes away with rest      History of Present Illness: Iyona Pehrson is a 81 y.o. female whose husband is my patient. I'm seeing her for the first time. She is here for a reported history of depressed left ventricular systolic function by echocardiography. This is mentioned in several notes that referred to an echocardiogram performed on April 30, during a hospitalization for noncardiac illness.  She was hospitalized in late April with lobar pneumonia and was also identified to have iron deficiency anemia. She was readmitted on May 15 with a perforated pyloric ulcer, surgically closed with an omental patch. These notes also state that her ejection fraction was 35%.  However review of the actual echocardiogram report shows left ventricular ejection fraction of 55-60 percent and really no cardiac abnormalities, other than moderate right atrial dilatation and mild right ventricular dilatation and borderline elevation in pulmonary artery pressure.  An echocardiogram performed in March 2015 also showed normal left heart findings and showed a similar degree of elevation in pulmonary artery pressure.   Mrs. Talmage Coin reports at least 10-15 years of ankle swelling occasionally higher up her calf and rarely up to the knees. This predates the recent questionable report of abnormal left ventricular function by a decade.  She has no other complaints to suggest congestive heart failure, although functional status is difficult to assess due to chronic  back and orthopedic problems. She also denies angina pectoris, palpitations, syncope,intermittent claudication or other cardiovascular complaints. On methadone for chronic back pain.   there is no family history of cardiac illness other than rheumatic fever in her sister.    her past medical history significantly negative for cardiac risk factors such as hypertension, diabetes, smoking , hypercholesterolemia. She has a history of a duodenal ulcer but does not have bleeding problems or a history of stroke. She has had multiple orthopedic surgeries including lumbar spine surgery bilateral shoulder replacement left wrist fracture repair and surgery for a trigger finger. She has also had a hysterectomy.   Past Medical History  Diagnosis Date  . Arthritis   . CHF (congestive heart failure)   . Pneumonia   . Depression   . GERD (gastroesophageal reflux disease)   . Perforated chronic gastric ulcer 08/11/2014    Past Surgical History  Procedure Laterality Date  . Abdominal hysterectomy    . Joint replacement Left     shoulder  . Reverse shoulder arthroplasty Right 12/04/2013  . Reverse shoulder arthroplasty  12/04/2013    Procedure: REVERSE SHOULDER ARTHROPLASTY;  Surgeon: Nita Sells, MD;  Location: [facility] OR;  Service: Orthopedics;;  Right reverse total shoulder arthroplasty  . Back surgery  2000  . Colon surgery      x2-blockage  . Hernia repair  2012    x2 with mesh  . Orif wrist fracture  2012    left  . Ercp    . Colonoscopy    . Rotator cuff repair  dr Tamera Punt  . Shoulder arthroscopy  2012    lt and rt  . Tendon repair Right 04/13/2014    Procedure: RIGHT HAND CENTRAL TENDON CENTRALIZATION OF LONG AND RING FINGERS;  Surgeon: Jolyn Nap, MD;  Location: [place];  Service: Orthopedics;  Laterality: Right;  . Laparotomy N/A 08/04/2014    Procedure: EXPLORATORY LAPAROTOMY, REPAIR OF PERFORATED ULCER;  Surgeon: Excell Seltzer, MD;  Location: [facility]  ORS;  Service: General;  Laterality: N/A;     Current Outpatient Prescriptions  Medication Sig Dispense Refill  . acetaminophen (TYLENOL) 325 MG tablet Take 325 mg by mouth every 6 (six) hours as needed for mild pain or headache.    . Ascorbic Acid (VITAMIN C) 1000 MG tablet Take 1,000 mg by mouth daily.    Marland Kitchen b complex vitamins tablet Take 1 tablet by mouth daily as needed.     . bisacodyl (DULCOLAX) 10 MG suppository Place 1 suppository (10 mg total) rectally daily as needed for moderate constipation (May repeat times one). 12 suppository 0  . budesonide (RHINOCORT AQUA) 32 MCG/ACT nasal spray Place 1 spray into both nostrils daily as needed for rhinitis.    . cholecalciferol (VITAMIN D) 1000 UNITS tablet Take 1,000 Units by mouth daily.    . diclofenac sodium (VOLTAREN) 1 % GEL Apply topically every three (3) days as needed (For Leg pain). 1 gm    . docusate sodium (COLACE) 100 MG capsule Take 1 capsule (100 mg total) by mouth 3 (three) times daily as needed. 20 capsule 0  . DULoxetine (CYMBALTA) 60 MG capsule Take 60 mg by mouth daily. At bedtime    . Esomeprazole Magnesium (NEXIUM 24HR PO) Take 22.3 mg by mouth daily.    . furosemide (LASIX) 80 MG tablet Take 80 mg by mouth daily as needed for fluid.     Marland Kitchen lactose free nutrition (BOOST PLUS) LIQD Take 237 mLs by mouth 2 (two) times daily between meals.  0  . magnesium hydroxide (MILK OF MAGNESIA) 400 MG/5ML suspension Take 30 mLs by mouth every other day. As needed for constipation    . methadone (DOLOPHINE) 5 MG tablet Take 5 mg by mouth every 8 (eight) hours.     . methocarbamol (ROBAXIN) 750 MG tablet Take 750 mg by mouth every 8 (eight) hours as needed for muscle spasms (Prescribed TID but patient takes PRN).     . Multiple Vitamins-Iron (MULTIVITAMINS WITH IRON) TABS tablet Take 1 tablet by mouth daily with supper.  0  . Omega-3 Fatty Acids (FISH OIL) 1200 MG CAPS Take 2 capsules by mouth daily.    Marland Kitchen oxyCODONE-acetaminophen (PERCOCET)  10-325 MG per tablet Take 1 tablet by mouth every 8 (eight) hours as needed for pain.    . potassium chloride SA (K-DUR,KLOR-CON) 20 MEQ tablet Take 20 mEq by mouth daily as needed (Only takes with lasix).    Marland Kitchen temazepam (RESTORIL) 30 MG capsule Take 1 capsule (30 mg total) by mouth at bedtime as needed for sleep. 30 capsule 0  . timolol (BETIMOL) 0.5 % ophthalmic solution Place 1 drop into both eyes daily.     . vitamin E 400 UNIT capsule Take 800 Units by mouth daily.      No current facility-administered medications for this visit.    Allergies:   Review of patient's allergies indicates no known allergies.    Social History:  The patient  reports that she has never smoked. She has never used smokeless tobacco. She  reports that she drinks about 4.2 oz of alcohol per week. She reports that she does not use illicit drugs.   Family History:  The patient's family history includes Breast cancer in her sister; Chronic fatigue in her daughter and daughter; Stroke in her mother; Ulcerative colitis in her daughter.    ROS:  Please see the history of present illness.    Otherwise, review of systems positive for  Leg pain with a pattern suggestive of spinal stenosis and spinal claudication..   All other systems are reviewed and negative.    PHYSICAL EXAM: VS:  BP 105/67 mmHg  Pulse 85  Ht 5' (1.524 m)  Wt 183 lb 1.6 oz (83.054 kg)  BMI 35.76 kg/m2 , BMI Body mass index is 35.76 kg/(m^2).  General: Alert, oriented x3, no distress Head: no evidence of trauma, PERRL, EOMI, no exophtalmos or lid lag, no myxedema, no xanthelasma; normal ears, nose and oropharynx Neck: normal jugular venous pulsations and no hepatojugular reflux; brisk carotid pulses without delay and no carotid bruits Chest: clear to auscultation, no signs of consolidation by percussion or palpation, normal fremitus, symmetrical and full respiratory excursions Cardiovascular: normal position and quality of the apical impulse,  regular rhythm, normal first and second heart sounds, no murmurs, rubs or gallops Abdomen: no tenderness or distention, no masses by palpation, no abnormal pulsatility or arterial bruits, normal bowel sounds, no hepatosplenomegaly Extremities: no clubbing, cyanosis or edema; 2+ radial, ulnar and brachial pulses bilaterally; 2+ right femoral, posterior tibial and dorsalis pedis pulses; 2+ left femoral, posterior tibial and dorsalis pedis pulses; no subclavian or femoral bruits Neurological: grossly nonfocal Psych: euthymic mood, full affect   EKG:  EKG is ordered today. The ekg ordered today demonstrates sinus rhythm, normal tracing   Recent Labs: 07/17/2014: B Natriuretic Peptide 159.5* 07/18/2014: TSH 1.605 08/06/2014: ALT 16 08/12/2014: BUN <5*; Creatinine, Ser 0.45; Hemoglobin 10.4*; Magnesium 2.1; Platelets PLATELET CLUMPS NOTED ON SMEAR, COUNT APPEARS ADEQUATE; Potassium 4.4; Sodium 137    Lipid Panel No results found for: CHOL, TRIG, HDL, CHOLHDL, VLDL, LDLCALC, LDLDIRECT    Wt Readings from Last 3 Encounters:  11/26/14 183 lb 1.6 oz (83.054 kg)  08/06/14 197 lb 1.5 oz (89.4 kg)  08/02/14 185 lb (83.915 kg)      Other studies Reviewed: Additional studies/ records that were reviewed today include:  Multiple records from hospitalization this spring.   ASSESSMENT AND PLAN:   as far as I can tell by ECG, clinical exam, review of the actual echocardiogram from April Mrs. Lebron does not have significant left ventricular dysfunction or left heart disease. She does have some findings that suggest mild pulmonary hypertension and mild cor pulmonale , likely secondary to chronic ventilatory problems. She does not have any manifestations of left heart failure. Her lower extremity edema predates these reported problems by many many years and might be related to cor pulmonale. From a symptom point of view she denies any cardiac issues and is quite satisfied with the treatment for her edema.  She prefers not to undergo further cardiac workup and I really agree that at this point this is not necessary.   I'm not certain how the reported left ventricular ejection fraction of 35% became well-established in her chart, but this appears to be inaccurate.   her pulmonary artery hypertension appears to be stable when studies from 2013 and 2015 are compared. Etiology is uncertain, but she declines workup at this time. She has at most limited symptoms for obstructive sleep  apnea, has never smoked or had diagnosis lung disease or pulmonary embolism.  Current medicines are reviewed at length with the patient today.  The patient does not have concerns regarding medicines.  The following changes have been made:  no change  Labs/ tests ordered today include:  No orders of the defined types were placed in this encounter.    Patient Instructions  Your physician recommends that you schedule a follow-up appointment As Deborra Medina, MD  11/28/2014 1:55 PM    Sanda Klein, MD, [facility] [phone] office [phone] pager

## 2014-11-28 ENCOUNTER — Encounter: Payer: Self-pay | Admitting: Cardiovascular Disease

## 2014-11-28 DIAGNOSIS — I2721 Secondary pulmonary arterial hypertension: Secondary | ICD-10-CM

## 2014-11-28 DIAGNOSIS — I519 Heart disease, unspecified: Secondary | ICD-10-CM | POA: Insufficient documentation

## 2014-11-28 HISTORY — DX: Secondary pulmonary arterial hypertension: I27.21

## 2014-11-30 ENCOUNTER — Other Ambulatory Visit: Payer: Self-pay | Admitting: *Deleted

## 2014-11-30 ENCOUNTER — Encounter: Payer: Self-pay | Admitting: *Deleted

## 2014-11-30 NOTE — Patient Outreach (Signed)
[facility]) Care Management  11/30/2014  Monique Russo Apr 26, 1930 [id number]   Assessment: Telephone screen Call placed to patient and spoke to her briefly. Identity verified. Care management coordinator introduced self and explained the purpose of the call. Verbal consent obtained. Patient's consent also obtained to talk to her husband due to patient not very motivated to talk on the phone.  Patient's husband Monique Russo) explained to care management coordinator that patient "will not do anything and will not even try at all"- pertaining to the [facility] services. He said he can not think of any needs that patient have at this time. He states "we have enough people coming in and giving services to her".   Husband reports that patient had a recent cardiologist appointment and Dr. Sallyanne Russo told patient's husband that he does not believe patient has congestive heart failure- looking at previous tests done. Per doctor's note, " no other complaints to suggest congestive heart failure" on patient. Husband states " not interested in any service at this time".  He appreciated the call back and told care management coordinator, " we will get in touch with you if we need you or get the doctor to refer [place] back to you". Patient has [facility] care management contact informations.  Plan: Will close case. Will notify primary care provider of case closure.  Monique Russo A. Orvis Stann, BSN, RN-BC [place] Management Coordinator Cell: [phone]

## 2014-12-01 ENCOUNTER — Telehealth: Payer: Self-pay | Admitting: Cardiovascular Disease

## 2014-12-01 NOTE — Patient Outreach (Signed)
[facility]) Care Management  12/01/2014  Monique Russo [birth year] [id number]   Notification from Dannielle Huh, RN to close case due to patient refused [facility] Management services.  Thanks, Ronnell Freshwater. Gr[place], [place] Assistant Phone: [phone] Fax: [phone]

## 2014-12-01 NOTE — Telephone Encounter (Signed)
Please call,he wants to talk to you about her last office visit.

## 2014-12-03 NOTE — Telephone Encounter (Signed)
Calling to go over the hospital admissions since April this year.  States Dr. Loletha Grayer told them she may not have had CHF in the past.  Informed Dr. Loletha Grayer can review past hospitalizations .

## 2015-04-05 DIAGNOSIS — F5101 Primary insomnia: Secondary | ICD-10-CM | POA: Diagnosis not present

## 2015-04-21 DIAGNOSIS — M1990 Unspecified osteoarthritis, unspecified site: Secondary | ICD-10-CM | POA: Diagnosis not present

## 2015-04-21 DIAGNOSIS — M533 Sacrococcygeal disorders, not elsewhere classified: Secondary | ICD-10-CM | POA: Diagnosis not present

## 2015-04-21 DIAGNOSIS — M5136 Other intervertebral disc degeneration, lumbar region: Secondary | ICD-10-CM | POA: Diagnosis not present

## 2015-04-28 DIAGNOSIS — M1711 Unilateral primary osteoarthritis, right knee: Secondary | ICD-10-CM | POA: Diagnosis not present

## 2015-04-28 DIAGNOSIS — M1712 Unilateral primary osteoarthritis, left knee: Secondary | ICD-10-CM | POA: Diagnosis not present

## 2015-04-30 DIAGNOSIS — Z13220 Encounter for screening for lipoid disorders: Secondary | ICD-10-CM | POA: Diagnosis not present

## 2015-04-30 DIAGNOSIS — Z0001 Encounter for general adult medical examination with abnormal findings: Secondary | ICD-10-CM | POA: Diagnosis not present

## 2015-05-05 DIAGNOSIS — G4700 Insomnia, unspecified: Secondary | ICD-10-CM | POA: Diagnosis not present

## 2015-05-05 DIAGNOSIS — Z0000 Encounter for general adult medical examination without abnormal findings: Secondary | ICD-10-CM | POA: Diagnosis not present

## 2015-06-16 DIAGNOSIS — M533 Sacrococcygeal disorders, not elsewhere classified: Secondary | ICD-10-CM | POA: Diagnosis not present

## 2015-06-16 DIAGNOSIS — M1990 Unspecified osteoarthritis, unspecified site: Secondary | ICD-10-CM | POA: Diagnosis not present

## 2015-06-16 DIAGNOSIS — M5136 Other intervertebral disc degeneration, lumbar region: Secondary | ICD-10-CM | POA: Diagnosis not present

## 2015-06-17 DIAGNOSIS — T814XXA Infection following a procedure, initial encounter: Secondary | ICD-10-CM | POA: Diagnosis not present

## 2015-08-04 DIAGNOSIS — F329 Major depressive disorder, single episode, unspecified: Secondary | ICD-10-CM | POA: Diagnosis not present

## 2015-08-04 DIAGNOSIS — G4700 Insomnia, unspecified: Secondary | ICD-10-CM | POA: Diagnosis not present

## 2015-08-17 DIAGNOSIS — T8579XS Infection and inflammatory reaction due to other internal prosthetic devices, implants and grafts, sequela: Secondary | ICD-10-CM | POA: Diagnosis not present

## 2015-08-19 DIAGNOSIS — M1711 Unilateral primary osteoarthritis, right knee: Secondary | ICD-10-CM | POA: Diagnosis not present

## 2015-08-19 DIAGNOSIS — M1712 Unilateral primary osteoarthritis, left knee: Secondary | ICD-10-CM | POA: Diagnosis not present

## 2015-09-24 DIAGNOSIS — T8579XS Infection and inflammatory reaction due to other internal prosthetic devices, implants and grafts, sequela: Secondary | ICD-10-CM | POA: Diagnosis not present

## 2015-09-29 DIAGNOSIS — R600 Localized edema: Secondary | ICD-10-CM | POA: Diagnosis not present

## 2015-09-29 DIAGNOSIS — L0390 Cellulitis, unspecified: Secondary | ICD-10-CM | POA: Diagnosis not present

## 2015-10-06 ENCOUNTER — Other Ambulatory Visit: Payer: Self-pay | Admitting: Internal Medicine

## 2015-10-06 DIAGNOSIS — R600 Localized edema: Secondary | ICD-10-CM | POA: Diagnosis not present

## 2015-10-06 DIAGNOSIS — M7989 Other specified soft tissue disorders: Secondary | ICD-10-CM

## 2015-10-06 DIAGNOSIS — L0390 Cellulitis, unspecified: Secondary | ICD-10-CM | POA: Diagnosis not present

## 2015-10-07 ENCOUNTER — Ambulatory Visit
Admission: RE | Admit: 2015-10-07 | Discharge: 2015-10-07 | Disposition: A | Discharge status: Home / Self Care | Payer: PPO | Source: Ambulatory Visit | Attending: Internal Medicine | Admitting: Internal Medicine

## 2015-10-07 DIAGNOSIS — M7989 Other specified soft tissue disorders: Secondary | ICD-10-CM | POA: Diagnosis not present

## 2015-10-20 DIAGNOSIS — H401122 Primary open-angle glaucoma, left eye, moderate stage: Secondary | ICD-10-CM | POA: Diagnosis not present

## 2015-10-20 DIAGNOSIS — Z961 Presence of intraocular lens: Secondary | ICD-10-CM | POA: Diagnosis not present

## 2015-10-20 DIAGNOSIS — H401112 Primary open-angle glaucoma, right eye, moderate stage: Secondary | ICD-10-CM | POA: Diagnosis not present

## 2015-10-27 ENCOUNTER — Emergency Department (HOSPITAL_COMMUNITY): Admission: EM | Admit: 2015-10-27 | Discharge: 2015-10-27 | Discharge status: Absent without leave | Payer: PPO

## 2015-10-27 ENCOUNTER — Other Ambulatory Visit (HOSPITAL_COMMUNITY): Payer: Self-pay | Admitting: Internal Medicine

## 2015-10-27 ENCOUNTER — Ambulatory Visit (HOSPITAL_COMMUNITY)
Admission: RE | Admit: 2015-10-27 | Discharge: 2015-10-27 | Disposition: A | Discharge status: Home / Self Care | Payer: PPO | Source: Ambulatory Visit | Attending: Internal Medicine | Admitting: Internal Medicine

## 2015-10-27 DIAGNOSIS — I700 Atherosclerosis of aorta: Secondary | ICD-10-CM | POA: Insufficient documentation

## 2015-10-27 DIAGNOSIS — K5730 Diverticulosis of large intestine without perforation or abscess without bleeding: Secondary | ICD-10-CM | POA: Diagnosis not present

## 2015-10-27 DIAGNOSIS — Z9889 Other specified postprocedural states: Secondary | ICD-10-CM | POA: Diagnosis not present

## 2015-10-27 DIAGNOSIS — E871 Hypo-osmolality and hyponatremia: Secondary | ICD-10-CM | POA: Diagnosis not present

## 2015-10-27 DIAGNOSIS — E876 Hypokalemia: Secondary | ICD-10-CM | POA: Diagnosis not present

## 2015-10-27 DIAGNOSIS — R1031 Right lower quadrant pain: Secondary | ICD-10-CM | POA: Insufficient documentation

## 2015-10-27 DIAGNOSIS — I2510 Atherosclerotic heart disease of native coronary artery without angina pectoris: Secondary | ICD-10-CM | POA: Diagnosis not present

## 2015-10-27 MED ORDER — IOPAMIDOL (ISOVUE-300) INJECTION 61%
100.0000 mL | Freq: Once | INTRAVENOUS | Status: AC | PRN
  Administered 2015-10-27: 100 mL via INTRAVENOUS

## 2015-11-03 DIAGNOSIS — M533 Sacrococcygeal disorders, not elsewhere classified: Secondary | ICD-10-CM | POA: Diagnosis not present

## 2015-11-05 DIAGNOSIS — T8579XS Infection and inflammatory reaction due to other internal prosthetic devices, implants and grafts, sequela: Secondary | ICD-10-CM | POA: Diagnosis not present

## 2015-12-02 DIAGNOSIS — M1711 Unilateral primary osteoarthritis, right knee: Secondary | ICD-10-CM | POA: Diagnosis not present

## 2015-12-02 DIAGNOSIS — M1712 Unilateral primary osteoarthritis, left knee: Secondary | ICD-10-CM | POA: Diagnosis not present

## 2015-12-15 DIAGNOSIS — T8579XS Infection and inflammatory reaction due to other internal prosthetic devices, implants and grafts, sequela: Secondary | ICD-10-CM | POA: Diagnosis not present

## 2015-12-17 ENCOUNTER — Inpatient Hospital Stay (HOSPITAL_COMMUNITY)
Admission: EM | Admit: 2015-12-17 | Discharge: 2016-01-06 | DRG: 335 | Disposition: A | Discharge status: Skilled Nursing Facility | Payer: PPO | Attending: Family Medicine | Admitting: Family Medicine

## 2015-12-17 ENCOUNTER — Encounter (HOSPITAL_COMMUNITY): Payer: Self-pay

## 2015-12-17 DIAGNOSIS — R05 Cough: Secondary | ICD-10-CM | POA: Diagnosis not present

## 2015-12-17 DIAGNOSIS — K56600 Partial intestinal obstruction, unspecified as to cause: Secondary | ICD-10-CM | POA: Diagnosis not present

## 2015-12-17 DIAGNOSIS — I9589 Other hypotension: Secondary | ICD-10-CM | POA: Diagnosis not present

## 2015-12-17 DIAGNOSIS — Z515 Encounter for palliative care: Secondary | ICD-10-CM

## 2015-12-17 DIAGNOSIS — M545 Low back pain: Secondary | ICD-10-CM | POA: Diagnosis not present

## 2015-12-17 DIAGNOSIS — Z90710 Acquired absence of both cervix and uterus: Secondary | ICD-10-CM | POA: Diagnosis not present

## 2015-12-17 DIAGNOSIS — G894 Chronic pain syndrome: Secondary | ICD-10-CM | POA: Diagnosis present

## 2015-12-17 DIAGNOSIS — J9811 Atelectasis: Secondary | ICD-10-CM | POA: Diagnosis not present

## 2015-12-17 DIAGNOSIS — K56699 Other intestinal obstruction unspecified as to partial versus complete obstruction: Secondary | ICD-10-CM | POA: Diagnosis not present

## 2015-12-17 DIAGNOSIS — J9601 Acute respiratory failure with hypoxia: Secondary | ICD-10-CM | POA: Diagnosis not present

## 2015-12-17 DIAGNOSIS — K56609 Unspecified intestinal obstruction, unspecified as to partial versus complete obstruction: Secondary | ICD-10-CM | POA: Diagnosis not present

## 2015-12-17 DIAGNOSIS — K439 Ventral hernia without obstruction or gangrene: Secondary | ICD-10-CM | POA: Diagnosis not present

## 2015-12-17 DIAGNOSIS — K219 Gastro-esophageal reflux disease without esophagitis: Secondary | ICD-10-CM | POA: Diagnosis present

## 2015-12-17 DIAGNOSIS — J690 Pneumonitis due to inhalation of food and vomit: Secondary | ICD-10-CM | POA: Diagnosis not present

## 2015-12-17 DIAGNOSIS — E43 Unspecified severe protein-calorie malnutrition: Secondary | ICD-10-CM

## 2015-12-17 DIAGNOSIS — M1990 Unspecified osteoarthritis, unspecified site: Secondary | ICD-10-CM | POA: Diagnosis present

## 2015-12-17 DIAGNOSIS — M6281 Muscle weakness (generalized): Secondary | ICD-10-CM

## 2015-12-17 DIAGNOSIS — E871 Hypo-osmolality and hyponatremia: Secondary | ICD-10-CM | POA: Diagnosis not present

## 2015-12-17 DIAGNOSIS — D638 Anemia in other chronic diseases classified elsewhere: Secondary | ICD-10-CM | POA: Diagnosis present

## 2015-12-17 DIAGNOSIS — T8579XA Infection and inflammatory reaction due to other internal prosthetic devices, implants and grafts, initial encounter: Secondary | ICD-10-CM | POA: Diagnosis not present

## 2015-12-17 DIAGNOSIS — R1084 Generalized abdominal pain: Secondary | ICD-10-CM | POA: Diagnosis not present

## 2015-12-17 DIAGNOSIS — I5042 Chronic combined systolic (congestive) and diastolic (congestive) heart failure: Secondary | ICD-10-CM | POA: Diagnosis present

## 2015-12-17 DIAGNOSIS — Z66 Do not resuscitate: Secondary | ICD-10-CM | POA: Diagnosis not present

## 2015-12-17 DIAGNOSIS — R10819 Abdominal tenderness, unspecified site: Secondary | ICD-10-CM | POA: Diagnosis not present

## 2015-12-17 DIAGNOSIS — I2721 Secondary pulmonary arterial hypertension: Secondary | ICD-10-CM | POA: Diagnosis not present

## 2015-12-17 DIAGNOSIS — E876 Hypokalemia: Secondary | ICD-10-CM | POA: Diagnosis not present

## 2015-12-17 DIAGNOSIS — K449 Diaphragmatic hernia without obstruction or gangrene: Secondary | ICD-10-CM | POA: Diagnosis present

## 2015-12-17 DIAGNOSIS — Z803 Family history of malignant neoplasm of breast: Secondary | ICD-10-CM

## 2015-12-17 DIAGNOSIS — R627 Adult failure to thrive: Secondary | ICD-10-CM | POA: Diagnosis not present

## 2015-12-17 DIAGNOSIS — I959 Hypotension, unspecified: Secondary | ICD-10-CM

## 2015-12-17 DIAGNOSIS — R918 Other nonspecific abnormal finding of lung field: Secondary | ICD-10-CM | POA: Diagnosis not present

## 2015-12-17 DIAGNOSIS — R0902 Hypoxemia: Secondary | ICD-10-CM

## 2015-12-17 DIAGNOSIS — R0602 Shortness of breath: Secondary | ICD-10-CM | POA: Diagnosis not present

## 2015-12-17 DIAGNOSIS — G4720 Circadian rhythm sleep disorder, unspecified type: Secondary | ICD-10-CM

## 2015-12-17 DIAGNOSIS — R197 Diarrhea, unspecified: Secondary | ICD-10-CM | POA: Diagnosis not present

## 2015-12-17 DIAGNOSIS — R64 Cachexia: Secondary | ICD-10-CM | POA: Diagnosis not present

## 2015-12-17 DIAGNOSIS — Z48815 Encounter for surgical aftercare following surgery on the digestive system: Secondary | ICD-10-CM | POA: Diagnosis not present

## 2015-12-17 DIAGNOSIS — Z7189 Other specified counseling: Secondary | ICD-10-CM

## 2015-12-17 DIAGNOSIS — K5900 Constipation, unspecified: Secondary | ICD-10-CM | POA: Diagnosis present

## 2015-12-17 DIAGNOSIS — K5651 Intestinal adhesions [bands], with partial obstruction: Secondary | ICD-10-CM | POA: Diagnosis not present

## 2015-12-17 DIAGNOSIS — T85898A Other specified complication of other internal prosthetic devices, implants and grafts, initial encounter: Secondary | ICD-10-CM | POA: Diagnosis not present

## 2015-12-17 DIAGNOSIS — G8929 Other chronic pain: Secondary | ICD-10-CM

## 2015-12-17 DIAGNOSIS — K5649 Other impaction of intestine: Secondary | ICD-10-CM | POA: Diagnosis not present

## 2015-12-17 DIAGNOSIS — Z8711 Personal history of peptic ulcer disease: Secondary | ICD-10-CM | POA: Diagnosis not present

## 2015-12-17 DIAGNOSIS — K5669 Other intestinal obstruction: Secondary | ICD-10-CM | POA: Diagnosis not present

## 2015-12-17 DIAGNOSIS — R109 Unspecified abdominal pain: Secondary | ICD-10-CM | POA: Diagnosis not present

## 2015-12-17 DIAGNOSIS — Z79891 Long term (current) use of opiate analgesic: Secondary | ICD-10-CM

## 2015-12-17 DIAGNOSIS — R262 Difficulty in walking, not elsewhere classified: Secondary | ICD-10-CM

## 2015-12-17 DIAGNOSIS — Y832 Surgical operation with anastomosis, bypass or graft as the cause of abnormal reaction of the patient, or of later complication, without mention of misadventure at the time of the procedure: Secondary | ICD-10-CM | POA: Diagnosis not present

## 2015-12-17 DIAGNOSIS — K5732 Diverticulitis of large intestine without perforation or abscess without bleeding: Secondary | ICD-10-CM | POA: Diagnosis not present

## 2015-12-17 DIAGNOSIS — R1311 Dysphagia, oral phase: Secondary | ICD-10-CM | POA: Diagnosis not present

## 2015-12-17 DIAGNOSIS — Z4689 Encounter for fitting and adjustment of other specified devices: Secondary | ICD-10-CM | POA: Diagnosis not present

## 2015-12-17 DIAGNOSIS — R0600 Dyspnea, unspecified: Secondary | ICD-10-CM

## 2015-12-17 DIAGNOSIS — K2970 Gastritis, unspecified, without bleeding: Secondary | ICD-10-CM | POA: Diagnosis not present

## 2015-12-17 DIAGNOSIS — K567 Ileus, unspecified: Secondary | ICD-10-CM | POA: Diagnosis not present

## 2015-12-17 DIAGNOSIS — I509 Heart failure, unspecified: Secondary | ICD-10-CM | POA: Diagnosis not present

## 2015-12-17 MED ORDER — SODIUM CHLORIDE 0.9 % IV BOLUS (SEPSIS)
500.0000 mL | Freq: Once | INTRAVENOUS | Status: AC
  Administered 2015-12-18: 500 mL via INTRAVENOUS

## 2015-12-17 MED ORDER — ONDANSETRON HCL 4 MG/2ML IJ SOLN
4.0000 mg | Freq: Once | INTRAMUSCULAR | Status: AC
Start: 2015-12-17 — End: 2015-12-18
  Administered 2015-12-18: 4 mg via INTRAVENOUS
  Filled 2015-12-17: qty 2

## 2015-12-17 MED ORDER — FENTANYL CITRATE (PF) 100 MCG/2ML IJ SOLN
50.0000 ug | Freq: Once | INTRAMUSCULAR | Status: AC
  Administered 2015-12-18: 50 ug via INTRAVENOUS
  Filled 2015-12-17: qty 2

## 2015-12-17 NOTE — ED Notes (Signed)
Pt states she is currently taken antibiotic for infection in abdomen; unknown name of Rx;

## 2015-12-17 NOTE — ED Notes (Signed)
Attempted IV x2 without success  

## 2015-12-17 NOTE — ED Triage Notes (Signed)
Per EMS pt had perforated bile last year and had mesh placed; Pt has not healed from surgery and goes to Md every 6 weeks for dressing change of RU abdominal area; Pt had some mesh and tissue removed on 12/15/15; Pt has had increased pain since 09/27; Pt spouse called Md this am who advised them to come to ED; Pt sat at home all day and called EMS tonight due to worsen pain; Pt c/o of abdominal at 9/10 on arrival; Pt a&ox 4 on arrival. Pt arrives ST on monitor

## 2015-12-17 NOTE — ED Provider Notes (Signed)
[facility] DEPT Provider Note   CSN: [id number] Arrival date & time: 12/17/15  2236  By signing my name below, I, Reola Mosher, attest that this documentation has been prepared under the direction and in the presence of Quintella Reichert, MD. Electronically Signed: Reola Mosher, ED Scribe. 12/17/15. 11:16 PM.  History   Chief Complaint Chief Complaint  Patient presents with  . Abdominal Pain   The history is provided by the patient, the EMS personnel and the spouse. No language interpreter was used.   HPI Comments: Monique Russo is a 82 y.o. female BIB EMS, with a PMhx of CHF, perforated gastric ulcer, and multiple abd hernias s/p repair, who presents to the Emergency Department complaining of constant, periumbilical abdominal pain onset ~2 days ago. Pt reports associated nausea, emesis, and constipation secondary to her abdominal pain. Husband states that approximately ~1 year ago pt had a gastric ulcer repair, and since then the wound has not healed as she has to visit her surgeon every six weeks for dressing changes. He notes that ~2 days ago, prior to the onset of her current pain, pt f'd/u w/ her surgeon for a debridement procedure of the surgical incision site and she has been in persistant pain since. She also has had two abdominal hernia repairs w/ mesh placement and an exploratory laparotomy to the abdomen. Pt is currently on a course of antibiotics that she has been taking prior to her debridement procedure. She is not currently on anticoagulant or antiplatelet therapy. Decreased PO intake since the onset of her pain. Denies fever, diarrhea, chest pain, or any other associated symptoms.   Past Medical History:  Diagnosis Date  . Arthritis   . CHF (congestive heart failure) ([place])   . Depression   . GERD (gastroesophageal reflux disease)   . Perforated chronic gastric ulcer ([place]) 08/11/2014  . Pneumonia   . Pulmonary arterial hypertension ([place]) 11/28/2014    Patient Active Problem List   Diagnosis Date Noted  . Left ventricular dysfunction  reported in chart, not confirmed on review of echo 11/28/2014  . Pulmonary arterial hypertension ([place]) 11/28/2014  . Perforated chronic gastric ulcer ([place]) 08/11/2014  . Encounter for intubation   . Ventilator dependence ([place])   . Perforated viscus 08/04/2014  . Acute respiratory failure with hypoxia ([place]) 07/18/2014  . Acute on chronic combined systolic and diastolic CHF (congestive heart failure) ([place]) 07/18/2014  . Lobar pneumonia ([place]) 07/18/2014  . Chronic pain syndrome   . Hyponatremia 07/17/2014  . Acute renal failure ([place]) 07/17/2014  . Elevated LFTs 07/17/2014  . Leukocytosis 07/08/2013  . DNR (do not resuscitate) 07/07/2013  . Palliative care encounter 07/07/2013  . Narcotic overdose 07/03/2013   Past Surgical History:  Procedure Laterality Date  . ABDOMINAL HYSTERECTOMY    . BACK SURGERY  2000  . COLON SURGERY     x2-blockage  . COLONOSCOPY    . ERCP    . HERNIA REPAIR  2012   x2 with mesh  . JOINT REPLACEMENT Left    shoulder  . LAPAROTOMY N/A 08/04/2014   Procedure: EXPLORATORY LAPAROTOMY, REPAIR OF PERFORATED ULCER;  Surgeon: Excell Seltzer, MD;  Location: [facility];  Service: General;  Laterality: N/A;  . ORIF WRIST FRACTURE  2012   left  . REVERSE SHOULDER ARTHROPLASTY Right 12/04/2013  . REVERSE SHOULDER ARTHROPLASTY  12/04/2013   Procedure: REVERSE SHOULDER ARTHROPLASTY;  Surgeon: Nita Sells, MD;  Location: [facility] OR;  Service: Orthopedics;;  Right reverse total shoulder  arthroplasty  . ROTATOR CUFF REPAIR     dr Tamera Punt  . SHOULDER ARTHROSCOPY  2012   lt and rt  . TENDON REPAIR Right 04/13/2014   Procedure: RIGHT HAND CENTRAL TENDON CENTRALIZATION OF LONG AND RING FINGERS;  Surgeon: Jolyn Nap, MD;  Location: [facility];  Service: Orthopedics;  Laterality: Right;   OB History    No data available     Home Medications    Prior to  Admission medications   Medication Sig Start Date End Date Taking? Authorizing Provider  acetaminophen (TYLENOL) 325 MG tablet Take 325 mg by mouth every 6 (six) hours as needed for mild pain or headache. 07/10/13   Melton Alar, PA-C  Ascorbic Acid (VITAMIN C) 1000 MG tablet Take 1,000 mg by mouth daily.    Historical Provider, MD  b complex vitamins tablet Take 1 tablet by mouth daily as needed.     Historical Provider, MD  bisacodyl (DULCOLAX) 10 MG suppository Place 1 suppository (10 mg total) rectally daily as needed for moderate constipation (May repeat times one). 07/10/13   Bobby Rumpf York, PA-C  budesonide (RHINOCORT AQUA) 32 MCG/ACT nasal spray Place 1 spray into both nostrils daily as needed for rhinitis.    Historical Provider, MD  cholecalciferol (VITAMIN D) 1000 UNITS tablet Take 1,000 Units by mouth daily.    Historical Provider, MD  diclofenac sodium (VOLTAREN) 1 % GEL Apply topically every three (3) days as needed (For Leg pain). 1 gm    Historical Provider, MD  docusate sodium (COLACE) 100 MG capsule Take 1 capsule (100 mg total) by mouth 3 (three) times daily as needed. 12/08/13   Grier Mitts, PA-C  DULoxetine (CYMBALTA) 60 MG capsule Take 60 mg by mouth daily. At bedtime    Historical Provider, MD  Esomeprazole Magnesium (NEXIUM 24HR PO) Take 22.3 mg by mouth daily.    Historical Provider, MD  furosemide (LASIX) 80 MG tablet Take 80 mg by mouth daily as needed for fluid.     Historical Provider, MD  lactose free nutrition (BOOST PLUS) LIQD Take 237 mLs by mouth 2 (two) times daily between meals. 08/12/14   Earnstine Regal, PA-C  magnesium hydroxide (MILK OF MAGNESIA) 400 MG/5ML suspension Take 30 mLs by mouth every other day. As needed for constipation    Historical Provider, MD  methadone (DOLOPHINE) 5 MG tablet Take 5 mg by mouth every 8 (eight) hours.     Historical Provider, MD  methocarbamol (ROBAXIN) 750 MG tablet Take 750 mg by mouth every 8 (eight) hours as needed  for muscle spasms (Prescribed TID but patient takes PRN).     Historical Provider, MD  Multiple Vitamins-Iron (MULTIVITAMINS WITH IRON) TABS tablet Take 1 tablet by mouth daily with supper. 08/12/14   Earnstine Regal, PA-C  Omega-3 Fatty Acids (FISH OIL) 1200 MG CAPS Take 2 capsules by mouth daily.    Historical Provider, MD  oxyCODONE-acetaminophen (PERCOCET) 10-325 MG per tablet Take 1 tablet by mouth every 8 (eight) hours as needed for pain.    Historical Provider, MD  potassium chloride SA (K-DUR,KLOR-CON) 20 MEQ tablet Take 20 mEq by mouth daily as needed (Only takes with lasix).    Historical Provider, MD  temazepam (RESTORIL) 30 MG capsule Take 1 capsule (30 mg total) by mouth at bedtime as needed for sleep. 07/10/13   Bobby Rumpf York, PA-C  timolol (BETIMOL) 0.5 % ophthalmic solution Place 1 drop into both eyes daily.     Historical  Provider, MD  vitamin E 400 UNIT capsule Take 800 Units by mouth daily.     Historical Provider, MD   Family History Family History  Problem Relation Age of Onset  . Stroke Mother   . Breast cancer Sister   . Ulcerative colitis Daughter   . Chronic fatigue Daughter   . Chronic fatigue Daughter    Social History Social History  Substance Use Topics  . Smoking status: Never Smoker  . Smokeless tobacco: Never Used  . Alcohol use 4.2 oz/week    7 Glasses of wine per week     Comment: 8 oz -12 oz of wine every    Allergies   Review of patient's allergies indicates no known allergies.  Review of Systems Review of Systems  Constitutional: Negative for fever.  Cardiovascular: Negative for chest pain.  Gastrointestinal: Positive for abdominal pain, constipation, nausea and vomiting. Negative for diarrhea.  All other systems reviewed and are negative.  Physical Exam Updated Vital Signs BP 143/86 (BP Location: Right Arm)   Pulse 113   Temp 98.2 F (36.8 C) (Oral)   Resp 23   Ht 5' (1.524 m)   Wt 161 lb (73 kg)   SpO2 94%   BMI 31.44 kg/m    Physical Exam  Constitutional: She is oriented to person, place, and time. She appears well-developed and well-nourished.  Ill appearing.   HENT:  Head: Normocephalic and atraumatic.  Cardiovascular: Normal rate and regular rhythm.   No murmur heard. Pulmonary/Chest: Effort normal and breath sounds normal. No respiratory distress.  Abdominal: Soft. There is tenderness. There is guarding. There is no rebound.  Linear, horizontal surgical wound to the center of her abdomen that appears to be healing well. Moderate diffuse abodminal tenderness. Voluntary guarding.   Musculoskeletal: She exhibits edema. She exhibits no tenderness.  Bilat LE pitting edema.  Neurological: She is alert and oriented to person, place, and time.  Skin: Skin is warm and dry.  Psychiatric: She has a normal mood and affect. Her behavior is normal.  Nursing note and vitals reviewed.  ED Treatments / Results  DIAGNOSTIC STUDIES: Oxygen Saturation is 94% on RA, adeqaute by my interpretation.   COORDINATION OF CARE: 11:15 PM-Discussed next steps with pt. Pt verbalized understanding and is agreeable with the plan.   Labs (all labs ordered are listed, but only abnormal results are displayed) Labs Reviewed  CULTURE, BLOOD (ROUTINE X 2)  CULTURE, BLOOD (ROUTINE X 2)  URINE CULTURE  COMPREHENSIVE METABOLIC PANEL  CBC WITH DIFFERENTIAL/PLATELET  URINALYSIS, ROUTINE W REFLEX MICROSCOPIC (NOT AT [facility])  I-STAT BETA HCG BLOOD, ED ([facility], [facility], [facility] ONLY)  I-STAT CG4 LACTIC ACID, ED    EKG  EKG Interpretation None       Radiology No results found.  Procedures Procedures (including critical care time)  Medications Ordered in ED Medications - No data to display   Initial Impression / Assessment and Plan / ED Course  I have reviewed the triage vital signs and the nursing notes.  Pertinent labs & imaging results that were available during my care of the patient were reviewed by me and considered in my medical  decision making (see chart for details).  Clinical Course     3:30 AM: Discussed case with Dr. Georgette Dover who agreed to consult on the pt in the ED.   Patient here for evaluation of abdominal pain, dry heaves, vomiting. She has a history of multiple prior abdominal surgeries. She has significant tenderness on examination. Her  abdominal wound appears to be healing with no evidence of active infection. CT scan concerning for bowel obstruction with question of diverticulitis. CBC with leukocytosis. Plan to admit to hospitalist service with general surgery consultation.  Final Clinical Impressions(s) / ED Diagnoses   Final diagnoses:  SBO (small bowel obstruction) (HCC)    New Prescriptions New Prescriptions   No medications on file   I personally performed the services described in this documentation, which was scribed in my presence. The recorded information has been reviewed and is accurate.     Quintella Reichert, MD 12/18/15 [phone]

## 2015-12-18 ENCOUNTER — Encounter (HOSPITAL_COMMUNITY): Payer: Self-pay | Admitting: Family Medicine

## 2015-12-18 ENCOUNTER — Emergency Department (HOSPITAL_COMMUNITY): Payer: PPO

## 2015-12-18 DIAGNOSIS — J690 Pneumonitis due to inhalation of food and vomit: Secondary | ICD-10-CM | POA: Diagnosis not present

## 2015-12-18 DIAGNOSIS — K5651 Intestinal adhesions [bands], with partial obstruction: Secondary | ICD-10-CM | POA: Diagnosis present

## 2015-12-18 DIAGNOSIS — Z8711 Personal history of peptic ulcer disease: Secondary | ICD-10-CM | POA: Diagnosis not present

## 2015-12-18 DIAGNOSIS — I9589 Other hypotension: Secondary | ICD-10-CM | POA: Diagnosis not present

## 2015-12-18 DIAGNOSIS — E871 Hypo-osmolality and hyponatremia: Secondary | ICD-10-CM

## 2015-12-18 DIAGNOSIS — E43 Unspecified severe protein-calorie malnutrition: Secondary | ICD-10-CM | POA: Diagnosis not present

## 2015-12-18 DIAGNOSIS — T8579XA Infection and inflammatory reaction due to other internal prosthetic devices, implants and grafts, initial encounter: Secondary | ICD-10-CM | POA: Diagnosis not present

## 2015-12-18 DIAGNOSIS — Z90710 Acquired absence of both cervix and uterus: Secondary | ICD-10-CM | POA: Diagnosis not present

## 2015-12-18 DIAGNOSIS — K5732 Diverticulitis of large intestine without perforation or abscess without bleeding: Secondary | ICD-10-CM

## 2015-12-18 DIAGNOSIS — I5042 Chronic combined systolic (congestive) and diastolic (congestive) heart failure: Secondary | ICD-10-CM | POA: Diagnosis present

## 2015-12-18 DIAGNOSIS — G4720 Circadian rhythm sleep disorder, unspecified type: Secondary | ICD-10-CM | POA: Diagnosis not present

## 2015-12-18 DIAGNOSIS — K219 Gastro-esophageal reflux disease without esophagitis: Secondary | ICD-10-CM | POA: Diagnosis present

## 2015-12-18 DIAGNOSIS — Z803 Family history of malignant neoplasm of breast: Secondary | ICD-10-CM | POA: Diagnosis not present

## 2015-12-18 DIAGNOSIS — K56609 Unspecified intestinal obstruction, unspecified as to partial versus complete obstruction: Secondary | ICD-10-CM | POA: Diagnosis present

## 2015-12-18 DIAGNOSIS — R109 Unspecified abdominal pain: Secondary | ICD-10-CM | POA: Diagnosis not present

## 2015-12-18 DIAGNOSIS — R918 Other nonspecific abnormal finding of lung field: Secondary | ICD-10-CM | POA: Diagnosis not present

## 2015-12-18 DIAGNOSIS — K567 Ileus, unspecified: Secondary | ICD-10-CM | POA: Diagnosis not present

## 2015-12-18 DIAGNOSIS — Y832 Surgical operation with anastomosis, bypass or graft as the cause of abnormal reaction of the patient, or of later complication, without mention of misadventure at the time of the procedure: Secondary | ICD-10-CM | POA: Diagnosis not present

## 2015-12-18 DIAGNOSIS — J9811 Atelectasis: Secondary | ICD-10-CM | POA: Diagnosis not present

## 2015-12-18 DIAGNOSIS — Z515 Encounter for palliative care: Secondary | ICD-10-CM | POA: Diagnosis not present

## 2015-12-18 DIAGNOSIS — I2721 Secondary pulmonary arterial hypertension: Secondary | ICD-10-CM | POA: Diagnosis present

## 2015-12-18 DIAGNOSIS — E876 Hypokalemia: Secondary | ICD-10-CM | POA: Diagnosis not present

## 2015-12-18 DIAGNOSIS — R64 Cachexia: Secondary | ICD-10-CM | POA: Diagnosis not present

## 2015-12-18 DIAGNOSIS — G894 Chronic pain syndrome: Secondary | ICD-10-CM | POA: Diagnosis present

## 2015-12-18 DIAGNOSIS — R262 Difficulty in walking, not elsewhere classified: Secondary | ICD-10-CM | POA: Diagnosis not present

## 2015-12-18 DIAGNOSIS — K5669 Other intestinal obstruction: Secondary | ICD-10-CM | POA: Diagnosis not present

## 2015-12-18 DIAGNOSIS — M1990 Unspecified osteoarthritis, unspecified site: Secondary | ICD-10-CM | POA: Diagnosis present

## 2015-12-18 DIAGNOSIS — D638 Anemia in other chronic diseases classified elsewhere: Secondary | ICD-10-CM | POA: Diagnosis present

## 2015-12-18 DIAGNOSIS — M545 Low back pain: Secondary | ICD-10-CM | POA: Diagnosis not present

## 2015-12-18 DIAGNOSIS — J9601 Acute respiratory failure with hypoxia: Secondary | ICD-10-CM | POA: Diagnosis not present

## 2015-12-18 DIAGNOSIS — Z66 Do not resuscitate: Secondary | ICD-10-CM | POA: Diagnosis not present

## 2015-12-18 DIAGNOSIS — G8929 Other chronic pain: Secondary | ICD-10-CM | POA: Diagnosis not present

## 2015-12-18 LAB — COMPREHENSIVE METABOLIC PANEL
ALBUMIN: 2.9 g/dL — AB (ref 3.5–5.0)
ALK PHOS: 83 U/L (ref 38–126)
ALT: 11 U/L — ABNORMAL LOW (ref 14–54)
ALT: 11 U/L — AB (ref 14–54)
ANION GAP: 12 (ref 5–15)
ANION GAP: 10 (ref 5–15)
AST: 27 U/L (ref 15–41)
AST: 19 U/L (ref 15–41)
Albumin: 2.7 g/dL — ABNORMAL LOW (ref 3.5–5.0)
Alkaline Phosphatase: 74 U/L (ref 38–126)
BILIRUBIN TOTAL: 0.7 mg/dL (ref 0.3–1.2)
BUN: 15 mg/dL (ref 6–20)
BUN: 11 mg/dL (ref 6–20)
CALCIUM: 10.5 mg/dL — AB (ref 8.9–10.3)
CHLORIDE: 95 mmol/L — AB (ref 101–111)
CO2: 23 mmol/L (ref 22–32)
CO2: 27 mmol/L (ref 22–32)
CREATININE: 1.19 mg/dL — AB (ref 0.44–1.00)
CREATININE: 1 mg/dL (ref 0.44–1.00)
Calcium: 9.6 mg/dL (ref 8.9–10.3)
Chloride: 95 mmol/L — ABNORMAL LOW (ref 101–111)
GFR calc Af Amer: 59 mL/min — ABNORMAL LOW (ref >60)
GFR calc non Af Amer: 51 mL/min — ABNORMAL LOW (ref >60)
GFR, EST AFRICAN AMERICAN: 48 mL/min — AB (ref >60)
GFR, EST NON AFRICAN AMERICAN: 41 mL/min — AB (ref >60)
GLUCOSE: 139 mg/dL — AB (ref 65–99)
Glucose, Bld: 119 mg/dL — ABNORMAL HIGH (ref 65–99)
POTASSIUM: 4.2 mmol/L (ref 3.5–5.1)
Potassium: 4.6 mmol/L (ref 3.5–5.1)
SODIUM: 132 mmol/L — AB (ref 135–145)
Sodium: 130 mmol/L — ABNORMAL LOW (ref 135–145)
Total Bilirubin: 0.5 mg/dL (ref 0.3–1.2)
Total Protein: 7.1 g/dL (ref 6.5–8.1)
Total Protein: 8.2 g/dL — ABNORMAL HIGH (ref 6.5–8.1)

## 2015-12-18 LAB — BASIC METABOLIC PANEL
Anion gap: 11 (ref 5–15)
BUN: 12 mg/dL (ref 6–20)
CHLORIDE: 96 mmol/L — AB (ref 101–111)
CO2: 23 mmol/L (ref 22–32)
Calcium: 9.6 mg/dL (ref 8.9–10.3)
Creatinine, Ser: 0.87 mg/dL (ref 0.44–1.00)
GFR calc non Af Amer: 60 mL/min — ABNORMAL LOW (ref >60)
Glucose, Bld: 131 mg/dL — ABNORMAL HIGH (ref 65–99)
POTASSIUM: 4.5 mmol/L (ref 3.5–5.1)
SODIUM: 130 mmol/L — AB (ref 135–145)

## 2015-12-18 LAB — URINALYSIS, ROUTINE W REFLEX MICROSCOPIC
GLUCOSE, UA: NEGATIVE mg/dL
HGB URINE DIPSTICK: NEGATIVE
KETONES UR: NEGATIVE mg/dL
LEUKOCYTES UA: NEGATIVE
Nitrite: NEGATIVE
PH: 5.5 (ref 5.0–8.0)
Protein, ur: NEGATIVE mg/dL
Specific Gravity, Urine: >1.046 — ABNORMAL HIGH (ref 1.005–1.030)

## 2015-12-18 LAB — CBC WITH DIFFERENTIAL/PLATELET
BASOS ABS: 0 10*3/uL (ref 0.0–0.1)
Basophils Relative: 0 %
Eosinophils Absolute: 0 10*3/uL (ref 0.0–0.7)
Eosinophils Relative: 0 %
HEMATOCRIT: 41.2 % (ref 36.0–46.0)
Hemoglobin: 13.9 g/dL (ref 12.0–15.0)
LYMPHS ABS: 1.1 10*3/uL (ref 0.7–4.0)
LYMPHS PCT: 6 %
MCH: 31.7 pg (ref 26.0–34.0)
MCHC: 33.7 g/dL (ref 30.0–36.0)
MCV: 93.8 fL (ref 78.0–100.0)
MONO ABS: 1.7 10*3/uL — AB (ref 0.1–1.0)
Monocytes Relative: 10 %
NEUTROS ABS: 14.3 10*3/uL — AB (ref 1.7–7.7)
Neutrophils Relative %: 84 %
Platelets: 506 10*3/uL — ABNORMAL HIGH (ref 150–400)
RBC: 4.39 MIL/uL (ref 3.87–5.11)
RDW: 14 % (ref 11.5–15.5)
WBC: 17.1 10*3/uL — ABNORMAL HIGH (ref 4.0–10.5)

## 2015-12-18 LAB — CBC
HCT: 38.7 % (ref 36.0–46.0)
Hemoglobin: 12.6 g/dL (ref 12.0–15.0)
MCH: 31.3 pg (ref 26.0–34.0)
MCHC: 32.6 g/dL (ref 30.0–36.0)
MCV: 96.3 fL (ref 78.0–100.0)
PLATELETS: 463 10*3/uL — AB (ref 150–400)
RBC: 4.02 MIL/uL (ref 3.87–5.11)
RDW: 14.2 % (ref 11.5–15.5)
WBC: 8.3 10*3/uL (ref 4.0–10.5)

## 2015-12-18 LAB — I-STAT CG4 LACTIC ACID, ED: LACTIC ACID, VENOUS: 1.7 mmol/L (ref 0.5–1.9)

## 2015-12-18 LAB — OSMOLALITY: OSMOLALITY: 287 mosm/kg (ref 275–295)

## 2015-12-18 LAB — I-STAT BETA HCG BLOOD, ED (MC, WL, AP ONLY): I-stat hCG, quantitative: <5 m[IU]/mL (ref <5)

## 2015-12-18 LAB — MRSA PCR SCREENING: MRSA BY PCR: NEGATIVE

## 2015-12-18 LAB — OSMOLALITY, URINE: OSMOLALITY UR: 647 mosm/kg (ref 300–900)

## 2015-12-18 LAB — SODIUM, URINE, RANDOM: Sodium, Ur: <10 mmol/L

## 2015-12-18 LAB — MAGNESIUM: MAGNESIUM: 2 mg/dL (ref 1.7–2.4)

## 2015-12-18 MED ORDER — LORAZEPAM 2 MG/ML IJ SOLN
1.0000 mg | Freq: Once | INTRAMUSCULAR | Status: AC | PRN
  Administered 2015-12-19: 1 mg via INTRAVENOUS
  Filled 2015-12-18: qty 1

## 2015-12-18 MED ORDER — FAMOTIDINE IN NACL 20-0.9 MG/50ML-% IV SOLN
20.0000 mg | INTRAVENOUS | Status: DC
  Administered 2015-12-18 – 2015-12-22 (×5): 20 mg via INTRAVENOUS
  Filled 2015-12-18 (×8): qty 50

## 2015-12-18 MED ORDER — TIMOLOL MALEATE 0.5 % OP SOLN
2.0000 [drp] | Freq: Every day | OPHTHALMIC | Status: DC
  Administered 2015-12-19 – 2016-01-06 (×18): 2 [drp] via OPHTHALMIC
  Filled 2015-12-18 (×2): qty 5

## 2015-12-18 MED ORDER — HYDROMORPHONE HCL 1 MG/ML IJ SOLN
1.0000 mg | INTRAMUSCULAR | Status: DC | PRN
  Administered 2015-12-18 – 2015-12-21 (×12): 1 mg via INTRAVENOUS
  Filled 2015-12-18 (×12): qty 1

## 2015-12-18 MED ORDER — BISACODYL 10 MG RE SUPP
10.0000 mg | Freq: Once | RECTAL | Status: AC
  Administered 2015-12-18: 10 mg via RECTAL
  Filled 2015-12-18: qty 1

## 2015-12-18 MED ORDER — SODIUM CHLORIDE 0.9 % IV SOLN
INTRAVENOUS | Status: DC
  Administered 2015-12-18 – 2015-12-20 (×4): via INTRAVENOUS

## 2015-12-18 MED ORDER — CIPROFLOXACIN IN D5W 400 MG/200ML IV SOLN
400.0000 mg | Freq: Two times a day (BID) | INTRAVENOUS | Status: DC
  Administered 2015-12-18 – 2015-12-19 (×4): 400 mg via INTRAVENOUS
  Filled 2015-12-18 (×6): qty 200

## 2015-12-18 MED ORDER — IOPAMIDOL (ISOVUE-300) INJECTION 61%
INTRAVENOUS | Status: AC
  Administered 2015-12-18: 100 mL
  Filled 2015-12-18: qty 100

## 2015-12-18 MED ORDER — ENOXAPARIN SODIUM 40 MG/0.4ML ~~LOC~~ SOLN
40.0000 mg | SUBCUTANEOUS | Status: DC
  Administered 2015-12-18 – 2015-12-20 (×3): 40 mg via SUBCUTANEOUS
  Filled 2015-12-18 (×3): qty 0.4

## 2015-12-18 MED ORDER — TIMOLOL MALEATE 0.5 % OP SOLN
1.0000 [drp] | Freq: Every day | OPHTHALMIC | Status: DC
  Administered 2015-12-18 – 2016-01-05 (×18): 1 [drp] via OPHTHALMIC
  Filled 2015-12-18 (×2): qty 5

## 2015-12-18 MED ORDER — PIPERACILLIN-TAZOBACTAM 3.375 G IVPB 30 MIN
3.3750 g | Freq: Once | INTRAVENOUS | Status: DC
  Filled 2015-12-18: qty 50

## 2015-12-18 MED ORDER — METRONIDAZOLE IN NACL 5-0.79 MG/ML-% IV SOLN
500.0000 mg | Freq: Three times a day (TID) | INTRAVENOUS | Status: DC
  Administered 2015-12-18 – 2015-12-20 (×7): 500 mg via INTRAVENOUS
  Filled 2015-12-18 (×10): qty 100

## 2015-12-18 MED ORDER — LORAZEPAM 2 MG/ML IJ SOLN
1.0000 mg | Freq: Once | INTRAMUSCULAR | Status: AC
  Administered 2015-12-19: 1 mg via INTRAVENOUS
  Filled 2015-12-18: qty 1

## 2015-12-18 MED ORDER — TIMOLOL HEMIHYDRATE 0.5 % OP SOLN: 1.0000 [drp] | OPHTHALMIC | Status: DC

## 2015-12-18 MED ORDER — ONDANSETRON HCL 4 MG/2ML IJ SOLN
4.0000 mg | Freq: Four times a day (QID) | INTRAMUSCULAR | Status: DC | PRN
  Administered 2015-12-18 – 2016-01-03 (×7): 4 mg via INTRAVENOUS
  Filled 2015-12-18 (×8): qty 2

## 2015-12-18 NOTE — Consult Note (Signed)
Reason for Consult:SBO Referring Physician: Bonnita Hollow [name] is an 82 y.o. female.  HPI: Monique Russo is well known to our practice. She is S/P laparoscopic incisional hernia repair with Goretex mesh by Dr. Zella Richer about 20y ago. In May 2016 showed underwent repair of perforated gastric ulcer by Dr. Excell Seltzer. Dr. Excell Seltzer has been following her in the office and has periodically debrided some of the Gore-Tex mesh from her wound. This was last done a few days ago. Subsequently, she developed nausea and vomiting. Evaluation the emergency department included CT scan which is consistent with small bowel obstruction. There was also question of possible mild sigmoid diverticulitis but she has not been having any lower abdominal pain. Minimal cramping.  Past Medical History:  Diagnosis Date  . Arthritis   . CHF (congestive heart failure) ([place])   . Depression   . GERD (gastroesophageal reflux disease)   . Perforated chronic gastric ulcer ([place]) 08/11/2014  . Pneumonia   . Pulmonary arterial hypertension ([place]) 11/28/2014    Past Surgical History:  Procedure Laterality Date  . ABDOMINAL HYSTERECTOMY    . BACK SURGERY  2000  . COLON SURGERY     x2-blockage  . COLONOSCOPY    . ERCP    . HERNIA REPAIR  2012   x2 with mesh  . JOINT REPLACEMENT Left    shoulder  . LAPAROTOMY N/A 08/04/2014   Procedure: EXPLORATORY LAPAROTOMY, REPAIR OF PERFORATED ULCER;  Surgeon: Excell Seltzer, MD;  Location: [facility];  Service: General;  Laterality: N/A;  . ORIF WRIST FRACTURE  2012   left  . REVERSE SHOULDER ARTHROPLASTY Right 12/04/2013  . REVERSE SHOULDER ARTHROPLASTY  12/04/2013   Procedure: REVERSE SHOULDER ARTHROPLASTY;  Surgeon: Nita Sells, MD;  Location: [facility] OR;  Service: Orthopedics;;  Right reverse total shoulder arthroplasty  . ROTATOR CUFF REPAIR     dr Tamera Punt  . SHOULDER ARTHROSCOPY  2012   lt and rt  . TENDON REPAIR Right 04/13/2014   Procedure: RIGHT HAND CENTRAL TENDON  CENTRALIZATION OF LONG AND RING FINGERS;  Surgeon: Jolyn Nap, MD;  Location: [facility];  Service: Orthopedics;  Laterality: Right;    Family History  Problem Relation Age of Onset  . Stroke Mother   . Breast cancer Sister   . Ulcerative colitis Daughter   . Chronic fatigue Daughter   . Chronic fatigue Daughter     Social History:  reports that she has never smoked. She has never used smokeless tobacco. She reports that she drinks about 4.2 oz of alcohol per week . She reports that she does not use drugs.  Allergies: No Known Allergies  Medications:  Scheduled:  Continuous: . sodium chloride 125 mL/hr at 12/18/15 0658  . ciprofloxacin Stopped (12/18/15 0705)  . metronidazole Stopped (12/18/15 0705)   HYQ:MVHQIONGEXBMW (DILAUDID) injection, ondansetron (ZOFRAN) IV  Results for orders placed or performed during the hospital encounter of 12/17/15 (from the past 48 hour(s))  Comprehensive metabolic panel     Status: Abnormal   Collection Time: 12/17/15 11:59 PM  Result Value Ref Range   Sodium 132 (L) 135 - 145 mmol/L   Potassium 4.6 3.5 - 5.1 mmol/L   Chloride 95 (L) 101 - 111 mmol/L   CO2 27 22 - 32 mmol/L   Glucose, Bld 139 (H) 65 - 99 mg/dL   BUN 11 6 - 20 mg/dL   Creatinine, Ser 1.00 0.44 - 1.00 mg/dL   Calcium 10.5 (H) 8.9 - 10.3 mg/dL  Total Protein 8.2 (H) 6.5 - 8.1 g/dL   Albumin 2.9 (L) 3.5 - 5.0 g/dL   AST 19 15 - 41 U/L   ALT 11 (L) 14 - 54 U/L   Alkaline Phosphatase 83 38 - 126 U/L   Total Bilirubin 0.7 0.3 - 1.2 mg/dL   GFR calc non Af Amer 51 (L) >60 mL/min   GFR calc Af Amer 59 (L) >60 mL/min    Comment: (NOTE) The eGFR has been calculated using the CKD EPI equation. This calculation has not been validated in all clinical situations. eGFR's persistently <60 mL/min signify possible Chronic Kidney Disease.    Anion gap 10 5 - 15  CBC with Differential     Status: Abnormal   Collection Time: 12/17/15 11:59 PM  Result Value Ref  Range   WBC 17.1 (H) 4.0 - 10.5 K/uL   RBC 4.39 3.87 - 5.11 MIL/uL   Hemoglobin 13.9 12.0 - 15.0 g/dL   HCT 41.2 36.0 - 46.0 %   MCV 93.8 78.0 - 100.0 fL   MCH 31.7 26.0 - 34.0 pg   MCHC 33.7 30.0 - 36.0 g/dL   RDW 14.0 11.5 - 15.5 %   Platelets 506 (H) 150 - 400 K/uL   Neutrophils Relative % 84 %   Neutro Abs 14.3 (H) 1.7 - 7.7 K/uL   Lymphocytes Relative 6 %   Lymphs Abs 1.1 0.7 - 4.0 K/uL   Monocytes Relative 10 %   Monocytes Absolute 1.7 (H) 0.1 - 1.0 K/uL   Eosinophils Relative 0 %   Eosinophils Absolute 0.0 0.0 - 0.7 K/uL   Basophils Relative 0 %   Basophils Absolute 0.0 0.0 - 0.1 K/uL  I-Stat CG4 Lactic Acid, ED     Status: None   Collection Time: 12/18/15 12:07 AM  Result Value Ref Range   Lactic Acid, Venous 1.70 0.5 - 1.9 mmol/L  I-Stat beta hCG blood, ED     Status: None   Collection Time: 12/18/15 12:17 AM  Result Value Ref Range   I-stat hCG, quantitative <5.0 <5 mIU/mL   Comment 3            Comment:   GEST. AGE      CONC.  (mIU/mL)   <=1 WEEK        5 - 50     2 WEEKS       50 - 500     3 WEEKS       100 - 10,000     4 WEEKS     1,000 - 30,000        FEMALE AND NON-PREGNANT FEMALE:     LESS THAN 5 mIU/mL     Dg Chest 2 View  Result Date: 12/18/2015 CLINICAL DATA:  82 y/o F; abdominal surgery last week, suspected sepsis. EXAM: CHEST  2 VIEW COMPARISON:  08/05/2014 chest radiograph. FINDINGS: Right mid and lower lung zone opacity is consistent with a large hiatal hernia seen on prior CT. Linear and patchy opacities of the lungs bilaterally, predominantly perihilar. Underlying pneumonia is not excluded. No pneumothorax or pleural effusion. Bilateral reverse total shoulder replacements and degenerative changes of the spine. Stable cardiac silhouette. IMPRESSION: Large right-sided hiatal hernia. Linear and patchy lung opacities probably represent mild edema and/or atelectasis. Pneumonia is not excluded. Electronically Signed   By: Kristine Garbe M.D.   On:  12/18/2015 01:28   Ct Abdomen Pelvis W Contrast  Result Date: 12/18/2015 CLINICAL DATA:  Abdominal pain and  vomiting. Perforated ulcer 1 year ago with subsequent surgery. Problems since then. EXAM: CT ABDOMEN AND PELVIS WITH CONTRAST TECHNIQUE: Multidetector CT imaging of the abdomen and pelvis was performed using the standard protocol following bolus administration of intravenous contrast. CONTRAST:  133m ISOVUE-300 IOPAMIDOL (ISOVUE-300) INJECTION 61% COMPARISON:  10/27/2015 FINDINGS: Lower chest: Atelectasis in the lung bases. Hepatobiliary: Gallbladder and bile ducts are mildly dilated. No stones or obstructing lesion appreciated. Pancreas: Pancreas is fatty replaced. Spleen: Normal in size without focal abnormality. Adrenals/Urinary Tract: No adrenal gland nodules. Atrophy and scarring of the kidneys. No hydronephrosis. Central calcification in the right renal hilum may represent a distal renal artery aneurysm. Cyst on the left kidney. No bladder wall thickening bladder stones. No adrenal gland nodules. Stomach/Bowel: Large esophageal hiatal hernia with distention of the stomach and distal esophagus. Small bowel are distended and predominantly fluid-filled throughout. Distal terminal ileum is decompressed. There appears to partial resection of the colon with and ileal colonic anastomosis. Changes suggest small bowel obstruction at the level of the distal ileum, transition zone appears to be in the right lower mid abdomen. Diverticulosis of the sigmoid colon. Mild stranding around the sigmoid colon could also represent diverticulitis. No abscess. Vascular/Lymphatic: Mild calcification and torsion of the aorta. No significant lymphadenopathy. Reproductive: Status post hysterectomy. No adnexal masses. Other: Postoperative changes in the anterior abdominal wall consistent with mesh hernia repair. There appears to be and open incision or scar centrally. No abscess. Small left inguinal hernia containing fat. No  free air or free fluid in the abdomen. Musculoskeletal: Degenerative changes and scoliosis of the lumbar spine. IMPRESSION: Diffusely dilated fluid-filled small bowel with transition zone in the distal ileum consistent with small bowel obstruction. Stomach and esophagus are also distended with large esophageal hiatal hernia. Ileocolonic anastomosis. Small left inguinal hernia containing fat. Diverticulosis of the sigmoid colon with stranding in the pelvic fat possibly indicating superimposed diverticulitis. No abscess Electronically Signed   By: WLucienne CapersM.D.   On: 12/18/2015 03:01    Review of Systems  Constitutional: Negative for chills and fever.  Eyes: Negative for blurred vision.  Respiratory: Negative for cough and shortness of breath.   Cardiovascular: Negative for chest pain.  Gastrointestinal: Positive for constipation, nausea and vomiting. Negative for abdominal pain.  Genitourinary: Negative.   Musculoskeletal: Negative.   Skin:       Chronic abdominal wound  Neurological: Negative.  Negative for headaches.  Endo/Heme/Allergies: Negative.   Psychiatric/Behavioral: Negative.    Blood pressure 126/73, pulse 95, temperature 98.8 F (37.1 C), temperature source Oral, resp. rate 12, height 5' (1.524 m), weight 73 kg (161 lb), SpO2 90 %. Physical Exam  Constitutional: She is oriented to person, place, and time. She appears well-developed and well-nourished. No distress.  HENT:  Head: Normocephalic.  Right Ear: External ear normal.  Left Ear: External ear normal.  Mouth/Throat: Oropharynx is clear and moist.  Eyes: EOM are normal. Pupils are equal, round, and reactive to light. No scleral icterus.  Neck: Neck supple. No tracheal deviation present.  Cardiovascular: Normal rate, regular rhythm and normal heart sounds.   Respiratory: Effort normal and breath sounds normal. No respiratory distress. She has no wheezes. She has no rales.  GI: She exhibits distension. There is  tenderness. There is no rebound and no guarding.    Minimal tenderness, hypoactive bowel sounds, small open wound with granulation tissue along the upper midline incision, mild purulent drainage, no cellulitis. No lower abdominal tenderness.  Musculoskeletal: Normal range of  motion. She exhibits no tenderness.  Neurological: She is alert and oriented to person, place, and time. She exhibits normal muscle tone.  Psychiatric: She has a normal mood and affect.    Assessment/Plan: SBO - NGT, IVF, abdominal x-rays in a.m. We may consider our small bowel protocol at that time. Possible diverticulitis - neither CT scan nor exam is impressive regarding this. On antibiotics for now. We will reevaluate tomorrow. I spoke with Dr. Tana Coast.  Monique Russo 12/18/2015, 8:02 AM

## 2015-12-18 NOTE — ED Notes (Signed)
Multiple unsuccessful attempts at inserting NG tube. Dr. Loleta Books aware.

## 2015-12-18 NOTE — H&P (Signed)
History and Physical  Patient Name: Monique Russo     [id number]    DOB: 1930/05/10    DOA: 12/17/2015 PCP: Horatio Pel, MD  General Surgery: Dr. Excell Seltzer    Patient coming from: Home  Chief Complaint: Abdominal pain and vomiting  HPI: Monique Russo is a 82 y.o. female with a past medical history significant for chronic pain on methadone and chronic non-healing surgical wound who presents with abdominal pain and vomiting for 3 days.  The patient had a perforated ulcer 1 year ago requiring [name] patch. This surgery was unfortunately complicated by nonhealing surgical wound (because of 80 year old infected hernia repair mesh?), Which for the last year the patient has been having wound care by her general surgeon in the office every six weeks ago (per family).  This week, two days ago, the patient had an office debridement and describes that Dr. Excell Seltzer did his usual debridement, after which she began to develop nausea and LLQ abdominal pain.  This crampy, severe, LLQ aching abdominal pain continued intermittently yesterday and was associated with several episodes of NBNB emesis and obstipation.  Today, she called his office in the morning and was recommended to have ER evaluation, but tried to wait it out all day until tonight when she came to the ER.  ED course: -Afebrile, heart rate 110s, respirations 20s, pulse oximetry low 90s, blood pressure 140/86 -Na 132, K 4.6, Cr 1.0 (baseline 0.5), WBC 17.1K, Hgb 13.9 -Lactate normal -Blood cultures were obtained -CT of the abdomen and pelvis was obtained that showed small bowel obstruction with transition point in the right mid abdomen and stranding around the colon, consistent with diverticulitis -The case was discussed with Gen Surg on call who recommended NG tube and conservative mgmt of SBO and TRH were asked to evaluate for admission  She also reports being started on Augmentin this week around the time of her  debridement.         ROS: Review of Systems  Constitutional: Positive for chills and malaise/fatigue. Negative for fever.  Respiratory: Negative for cough and sputum production.   Gastrointestinal: Positive for abdominal pain, constipation, nausea and vomiting. Negative for blood in stool, diarrhea and melena.  All other systems reviewed and are negative.         Past Medical History:  Diagnosis Date  . Arthritis   . CHF (congestive heart failure) ([place])   . Depression   . GERD (gastroesophageal reflux disease)   . Perforated chronic gastric ulcer ([place]) 08/11/2014  . Pneumonia   . Pulmonary arterial hypertension ([place]) 11/28/2014    Past Surgical History:  Procedure Laterality Date  . ABDOMINAL HYSTERECTOMY    . BACK SURGERY  2000  . COLON SURGERY     x2-blockage  . COLONOSCOPY    . ERCP    . HERNIA REPAIR  2012   x2 with mesh  . JOINT REPLACEMENT Left    shoulder  . LAPAROTOMY N/A 08/04/2014   Procedure: EXPLORATORY LAPAROTOMY, REPAIR OF PERFORATED ULCER;  Surgeon: Excell Seltzer, MD;  Location: [facility];  Service: General;  Laterality: N/A;  . ORIF WRIST FRACTURE  2012   left  . REVERSE SHOULDER ARTHROPLASTY Right 12/04/2013  . REVERSE SHOULDER ARTHROPLASTY  12/04/2013   Procedure: REVERSE SHOULDER ARTHROPLASTY;  Surgeon: Nita Sells, MD;  Location: [facility] OR;  Service: Orthopedics;;  Right reverse total shoulder arthroplasty  . ROTATOR CUFF REPAIR     dr Tamera Punt  . SHOULDER ARTHROSCOPY  2012  lt and rt  . TENDON REPAIR Right 04/13/2014   Procedure: RIGHT HAND CENTRAL TENDON CENTRALIZATION OF LONG AND RING FINGERS;  Surgeon: Jolyn Nap, MD;  Location: [place];  Service: Orthopedics;  Laterality: Right;    Social History: Patient lives with her husband.  The patient uses a mobile wheelchair but can perform transfers.  She is not a smoker.  She is a Research scientist (medical), grew up in [place], lived most of her life in [place].    No  Known Allergies  Family history: family history includes Breast cancer in her sister; Chronic fatigue in her daughter and daughter; Stroke in her mother; Ulcerative colitis in her daughter.  Prior to Admission medications   Medication Sig Start Date End Date Taking? Authorizing Provider  acetaminophen (TYLENOL) 325 MG tablet Take 325 mg by mouth every 6 (six) hours as needed for mild pain or headache. 07/10/13   Melton Alar, PA-C  Ascorbic Acid (VITAMIN C) 1000 MG tablet Take 1,000 mg by mouth daily.    Historical Provider, MD  b complex vitamins tablet Take 1 tablet by mouth daily as needed.     Historical Provider, MD  bisacodyl (DULCOLAX) 10 MG suppository Place 1 suppository (10 mg total) rectally daily as needed for moderate constipation (May repeat times one). 07/10/13   Bobby Rumpf York, PA-C  budesonide (RHINOCORT AQUA) 32 MCG/ACT nasal spray Place 1 spray into both nostrils daily as needed for rhinitis.    Historical Provider, MD  cholecalciferol (VITAMIN D) 1000 UNITS tablet Take 1,000 Units by mouth daily.    Historical Provider, MD  diclofenac sodium (VOLTAREN) 1 % GEL Apply topically every three (3) days as needed (For Leg pain). 1 gm    Historical Provider, MD  docusate sodium (COLACE) 100 MG capsule Take 1 capsule (100 mg total) by mouth 3 (three) times daily as needed. 12/08/13   Grier Mitts, PA-C  DULoxetine (CYMBALTA) 60 MG capsule Take 60 mg by mouth daily. At bedtime    Historical Provider, MD  Esomeprazole Magnesium (NEXIUM 24HR PO) Take 22.3 mg by mouth daily.    Historical Provider, MD  furosemide (LASIX) 80 MG tablet Take 80 mg by mouth daily as needed for fluid.     Historical Provider, MD  lactose free nutrition (BOOST PLUS) LIQD Take 237 mLs by mouth 2 (two) times daily between meals. 08/12/14   Earnstine Regal, PA-C  magnesium hydroxide (MILK OF MAGNESIA) 400 MG/5ML suspension Take 30 mLs by mouth every other day. As needed for constipation    Historical Provider,  MD  methadone (DOLOPHINE) 5 MG tablet Take 5 mg by mouth every 8 (eight) hours.     Historical Provider, MD  methocarbamol (ROBAXIN) 750 MG tablet Take 750 mg by mouth every 8 (eight) hours as needed for muscle spasms (Prescribed TID but patient takes PRN).     Historical Provider, MD  Multiple Vitamins-Iron (MULTIVITAMINS WITH IRON) TABS tablet Take 1 tablet by mouth daily with supper. 08/12/14   Earnstine Regal, PA-C  Omega-3 Fatty Acids (FISH OIL) 1200 MG CAPS Take 2 capsules by mouth daily.    Historical Provider, MD  oxyCODONE-acetaminophen (PERCOCET) 10-325 MG per tablet Take 1 tablet by mouth every 8 (eight) hours as needed for pain.    Historical Provider, MD  potassium chloride SA (K-DUR,KLOR-CON) 20 MEQ tablet Take 20 mEq by mouth daily as needed (Only takes with lasix).    Historical Provider, MD  temazepam (RESTORIL) 30 MG capsule Take  1 capsule (30 mg total) by mouth at bedtime as needed for sleep. 07/10/13   Bobby Rumpf York, PA-C  timolol (BETIMOL) 0.5 % ophthalmic solution Place 1 drop into both eyes daily.     Historical Provider, MD  vitamin E 400 UNIT capsule Take 800 Units by mouth daily.     Historical Provider, MD       Physical Exam: BP 135/86   Pulse 114   Temp 98.2 F (36.8 C) (Oral)   Resp 23   Ht 5' (1.524 m)   Wt 73 kg (161 lb)   SpO2 91%   BMI 31.44 kg/m  General appearance: Overweight, frail elderly adult female, alert and in moderate distress from pain.   Eyes: Anicteric, conjunctiva pink, lids and lashes normal. PERRL.    ENT: No nasal deformity, discharge, epistaxis.  Hearing normal. OP moist without lesions.   Skin: Warm and dry.  No jaundice.  No suspicious rashes or lesions.  The abdomen wound appears about 4 cm wide and has no foul drainage, surrounding redness, pain. Cardiac: Tachycardic, nl S1-S2, no murmurs appreciated.  Capillary refill is brisk.  JVP not visible.  Marked pitting LE edema.  Radial and DP pulses 2+ and symmetric. Respiratory:  Normal respiratory rate and rhythm.  CTAB without rales or wheezes.  Atelectasis at bases. Abdomen: Abdomen soft.  Marked TTP in lower quadrants without rebound. No ascites, distension, hepatosplenomegaly.   MSK: No deformities or effusions.  No cyanosis or clubbing. Neuro: Cranial nerves normal.  Sensation intact to light touch. Speech is fluent.  Muscle strength globally weak.    Psych: Sensorium intact and responding to questions, attention normal.  Behavior appropriate.  Affect: in pain.  Judgment and insight appear normal.     Labs on Admission:  I have personally reviewed following labs and imaging studies: CBC:  Recent Labs Lab 12/17/15 2359  WBC 17.1*  NEUTROABS 14.3*  HGB 13.9  HCT 41.2  MCV 93.8  PLT [id number]*   Basic Metabolic Panel:  Recent Labs Lab 12/17/15 2359  NA 132*  K 4.6  CL 95*  CO2 27  GLUCOSE 139*  BUN 11  CREATININE 1.00  CALCIUM 10.5*   GFR: Estimated Creatinine Clearance: 38 mL/min (by C-G formula based on SCr of 1 mg/dL).  Liver Function Tests:  Recent Labs Lab 12/17/15 2359  AST 19  ALT 11*  ALKPHOS 83  BILITOT 0.7  PROT 8.2*  ALBUMIN 2.9*   No results for input(s): LIPASE, AMYLASE in the last 168 hours. No results for input(s): AMMONIA in the last 168 hours. Coagulation Profile: No results for input(s): INR, PROTIME in the last 168 hours. Cardiac Enzymes: No results for input(s): CKTOTAL, CKMB, CKMBINDEX, TROPONINI in the last 168 hours. BNP (last 3 results) No results for input(s): PROBNP in the last 8760 hours. HbA1C: No results for input(s): HGBA1C in the last 72 hours. CBG: No results for input(s): GLUCAP in the last 168 hours. Lipid Profile: No results for input(s): CHOL, HDL, LDLCALC, TRIG, CHOLHDL, LDLDIRECT in the last 72 hours. Thyroid Function Tests: No results for input(s): TSH, T4TOTAL, FREET4, T3FREE, THYROIDAB in the last 72 hours. Anemia Panel: No results for input(s): VITAMINB12, FOLATE, FERRITIN, TIBC, IRON,  RETICCTPCT in the last 72 hours. Sepsis Labs: Lactic acid 1.7 Invalid input(s): PROCALCITONIN, LACTICIDVEN No results found for this or any previous visit (from the past 240 hour(s)).       Radiological Exams on Admission: Personally reviewed CXR shows hiatal hernia very large  and extending into the right chest, no definite pneumonia, CT views of lung fields show no pneumonia: Dg Chest 2 View  Result Date: 12/18/2015 CLINICAL DATA:  82 y/o F; abdominal surgery last week, suspected sepsis. EXAM: CHEST  2 VIEW COMPARISON:  08/05/2014 chest radiograph. FINDINGS: Right mid and lower lung zone opacity is consistent with a large hiatal hernia seen on prior CT. Linear and patchy opacities of the lungs bilaterally, predominantly perihilar. Underlying pneumonia is not excluded. No pneumothorax or pleural effusion. Bilateral reverse total shoulder replacements and degenerative changes of the spine. Stable cardiac silhouette. IMPRESSION: Large right-sided hiatal hernia. Linear and patchy lung opacities probably represent mild edema and/or atelectasis. Pneumonia is not excluded. Electronically Signed   By: Kristine Garbe M.D.   On: 12/18/2015 01:28   Ct Abdomen Pelvis W Contrast  Result Date: 12/18/2015 CLINICAL DATA:  Abdominal pain and vomiting. Perforated ulcer 1 year ago with subsequent surgery. Problems since then. EXAM: CT ABDOMEN AND PELVIS WITH CONTRAST TECHNIQUE: Multidetector CT imaging of the abdomen and pelvis was performed using the standard protocol following bolus administration of intravenous contrast. CONTRAST:  160mL ISOVUE-300 IOPAMIDOL (ISOVUE-300) INJECTION 61% COMPARISON:  10/27/2015 FINDINGS: Lower chest: Atelectasis in the lung bases. Hepatobiliary: Gallbladder and bile ducts are mildly dilated. No stones or obstructing lesion appreciated. Pancreas: Pancreas is fatty replaced. Spleen: Normal in size without focal abnormality. Adrenals/Urinary Tract: No adrenal gland nodules.  Atrophy and scarring of the kidneys. No hydronephrosis. Central calcification in the right renal hilum may represent a distal renal artery aneurysm. Cyst on the left kidney. No bladder wall thickening bladder stones. No adrenal gland nodules. Stomach/Bowel: Large esophageal hiatal hernia with distention of the stomach and distal esophagus. Small bowel are distended and predominantly fluid-filled throughout. Distal terminal ileum is decompressed. There appears to partial resection of the colon with and ileal colonic anastomosis. Changes suggest small bowel obstruction at the level of the distal ileum, transition zone appears to be in the right lower mid abdomen. Diverticulosis of the sigmoid colon. Mild stranding around the sigmoid colon could also represent diverticulitis. No abscess. Vascular/Lymphatic: Mild calcification and torsion of the aorta. No significant lymphadenopathy. Reproductive: Status post hysterectomy. No adnexal masses. Other: Postoperative changes in the anterior abdominal wall consistent with mesh hernia repair. There appears to be and open incision or scar centrally. No abscess. Small left inguinal hernia containing fat. No free air or free fluid in the abdomen. Musculoskeletal: Degenerative changes and scoliosis of the lumbar spine. IMPRESSION: Diffusely dilated fluid-filled small bowel with transition zone in the distal ileum consistent with small bowel obstruction. Stomach and esophagus are also distended with large esophageal hiatal hernia. Ileocolonic anastomosis. Small left inguinal hernia containing fat. Diverticulosis of the sigmoid colon with stranding in the pelvic fat possibly indicating superimposed diverticulitis. No abscess Electronically Signed   By: Lucienne Capers M.D.   On: 12/18/2015 03:01    Echocardiogram 2016:  EF 55-60% No significant valvular disease         Assessment/Plan 1. SBO:  -Place NG -NPO -MIVF -Hydromorphone for pain, may need increased dose  given chronic narcotics -Ondansetron for pain -Repeat abdomen radiograph tomorrow -Consult to General Surgery, appreciate cares   2. Diverticulitis:  There is stranding around the colon that suggests diverticulitis.   -Stop Augmentin -Ciprofloxacin and Flagyl IV  3. Hyponatremia:  -Check urine sodium and osmolalities -Trend BMP while on fluids  4. Chronic pain syndrome:  -Hold methadone and hydrocodone while NPO  5. GERD and hiatal hernia: -  Replace PPI with IV famotidine while NPO   6. Other medications: Patient unable to reconcile her medications, but per her [facility] chart takes oxycodone 15 mg TID and methadone 5 mg TID     DVT prophylaxis: Lovenox  Code Status: FULL  Family Communication: Sisters at bedside.  Diagnosis discussed.  An opportunity for questions was given and all questions were answered.  CODE STATUS was specifically confirmed.  Disposition Plan: Anticipate conservative mgmt of SBO. Consults called: General Surgery Admission status: INPATIENT, med surg       Medical decision making: Patient seen at 4:20 AM on 12/18/2015.  The patient was discussed with Dr. Ralene Bathe.  What exists of the patient's chart was reviewed in depth and summarized above.  Clinical condition: stable.        Edwin Dada [facility] Pager [phone]

## 2015-12-18 NOTE — Progress Notes (Signed)
[facility]                                                                              Patient Demographics  Monique Russo, is a 82 y.o. female, DOB - [birth year], [place] date - 12/17/2015   Admitting Physician No admitting provider for patient encounter.  Outpatient Primary MD for the patient is Monique Pel, MD  Outpatient specialists:   LOS - 0  days    Chief Complaint  Patient presents with  . Abdominal Pain       Brief summary   Monique Russo is a 82 y.o. female with a past medical history significant for chronic pain on methadone and chronic non-healing surgical wound who presented with abdominal pain and vomiting for 3 days. The patient had a perforated ulcer 1 year ago requiring Monique Russo patch. This surgery was unfortunately complicated by nonhealing surgical wound (because of 82 year old infected hernia repair mesh?), Which for the last year the patient has been having wound care by her general surgeon in the office every six weeks (per family). This week, two days prior to admission, the patient had an office debridement and described that Monique Russo did his usual debridement, after which she began to develop nausea and LLQ abdominal pain.  This crampy, severe, LLQ aching abdominal pain continued intermittently and was associated with several episodes of nonbloody emesis and obstipation. On the morning of admission, she was recommended by surgery office for ER evaluation. CT of the abdomen and pelvis was obtained that showed small bowel obstruction with transition point in the right mid abdomen and stranding around the colon, consistent with diverticulitis. Surgery was consulted and patient was admitted for further workup.   Assessment & Plan    Principal Problem: Small bowel obstruction -Continue  NPO status, IV fluids  - Multiple unsuccessful attempts in ED for NG placement, requested NG placement under fluoroscopy guidance  -  Continue pain control and antiemetics - Discussed with Monique Russo, recommended continue conservative management and patient will be placed on SBO protocol   Diverticulitis:  There is stranding around the colon that suggests diverticulitis, although no fevers, she does have leukocytosis. -Stop Augmentin -Placed on Ciprofloxacin and Flagyl IV for today, will reassess in a.m.   Hyponatremia:  -Check urine sodium and osmolalities, continue IV fluid hydration   Chronic pain syndrome:  -Hold methadone and hydrocodone while NPO   GERD and hiatal hernia: -Replace PPI with IV famotidine while NPO   Code Status: full DVT Prophylaxis: SCD's Family Communication: Discussed in detail with the patient, all imaging results, lab results explained to the patient   Disposition Plan: Time Spent in minutes  25 minutes  Procedures:  CT abd  Consultants:   Surgery- Monique Russo  Antimicrobials:   IV ciprofloxacin 9/30  IV Flagyl 9/30   Medications  Scheduled Meds:  Continuous Infusions: . sodium chloride 125 mL/hr at 12/18/15 0658  . ciprofloxacin Stopped (12/18/15 0705)  . metronidazole Stopped (12/18/15 0705)   PRN Meds:.HYDROmorphone (DILAUDID) injection, ondansetron (ZOFRAN) IV   Antibiotics   Anti-infectives    Start     Dose/Rate Route  Frequency Ordered Stop   12/18/15 0430  ciprofloxacin (CIPRO) IVPB 400 mg     400 mg 200 mL/hr over 60 Minutes Intravenous 2 times daily 12/18/15 0427     12/18/15 0430  metroNIDAZOLE (FLAGYL) IVPB 500 mg     500 mg 100 mL/hr over 60 Minutes Intravenous Every 8 hours 12/18/15 0427     12/18/15 0345  piperacillin-tazobactam (ZOSYN) IVPB 3.375 g  Status:  Discontinued     3.375 g 100 mL/hr over 30 Minutes Intravenous  Once 12/18/15 0330 12/18/15 [id number]        Subjective:   Monique Russo was seen and examined today.Patient sleeping and requesting not to be bothered. Denies any abdominal pain at this time, no nausea or  vomiting at the time of my encounter. No fevers.  No chest pain or shortness of breath.  Objective:   Vitals:   12/18/15 0830 12/18/15 0845 12/18/15 0900 12/18/15 1023  BP: 148/79 147/75 141/82 138/85  Pulse: 97 96 99 94  Resp: 13 14 13 12   Temp:      TempSrc:      SpO2: 91% 92% 93% 91%  Weight:      Height:        Intake/Output Summary (Last 24 hours) at 12/18/15 1034 Last data filed at 12/18/15 0705  Gross per 24 hour  Intake              800 ml  Output                0 ml  Net              800 ml     Wt Readings from Last 3 Encounters:  12/17/15 73 kg (161 lb)  11/26/14 83.1 kg (183 lb 1.6 oz)  08/06/14 89.4 kg (197 lb 1.5 oz)     Exam  General: Sleepy but easily arousable and oriented 3  HEENT:    Neck: Supple, no JVD  Cardiovascular: S1 S2 auscultated, no rubs, murmurs or gallops. Regular rate and rhythm.  Respiratory: Clear to auscultation bilaterally, no wheezing, rales or rhonchi  Gastrointestinal: Soft, dressing intact, mild tenderness, hypoactive bowel sounds  Ext: no cyanosis clubbing, + edema  Neuro: AAOx3, Cr N's II- XII. Strength 5/5 upper and lower extremities bilaterally  Skin: No rashes  Psych: sleepy but arousable and oriented 3   Data Reviewed:  I have personally reviewed following labs and imaging studies  Micro Results No results found for this or any previous visit (from the past 240 hour(s)).  Radiology Reports Dg Chest 2 View  Result Date: 12/18/2015 CLINICAL DATA:  82 y/o F; abdominal surgery last week, suspected sepsis. EXAM: CHEST  2 VIEW COMPARISON:  08/05/2014 chest radiograph. FINDINGS: Right mid and lower lung zone opacity is consistent with a large hiatal hernia seen on prior CT. Linear and patchy opacities of the lungs bilaterally, predominantly perihilar. Underlying pneumonia is not excluded. No pneumothorax or pleural effusion. Bilateral reverse total shoulder replacements and degenerative changes of the spine. Stable  cardiac silhouette. IMPRESSION: Large right-sided hiatal hernia. Linear and patchy lung opacities probably represent mild edema and/or atelectasis. Pneumonia is not excluded. Electronically Signed   By: Kristine Garbe M.D.   On: 12/18/2015 01:28   Ct Abdomen Pelvis W Contrast  Result Date: 12/18/2015 CLINICAL DATA:  Abdominal pain and vomiting. Perforated ulcer 1 year ago with subsequent surgery. Problems since then. EXAM: CT ABDOMEN AND PELVIS WITH CONTRAST TECHNIQUE: Multidetector CT imaging of the  abdomen and pelvis was performed using the standard protocol following bolus administration of intravenous contrast. CONTRAST:  15mL ISOVUE-300 IOPAMIDOL (ISOVUE-300) INJECTION 61% COMPARISON:  10/27/2015 FINDINGS: Lower chest: Atelectasis in the lung bases. Hepatobiliary: Gallbladder and bile ducts are mildly dilated. No stones or obstructing lesion appreciated. Pancreas: Pancreas is fatty replaced. Spleen: Normal in size without focal abnormality. Adrenals/Urinary Tract: No adrenal gland nodules. Atrophy and scarring of the kidneys. No hydronephrosis. Central calcification in the right renal hilum may represent a distal renal artery aneurysm. Cyst on the left kidney. No bladder wall thickening bladder stones. No adrenal gland nodules. Stomach/Bowel: Large esophageal hiatal hernia with distention of the stomach and distal esophagus. Small bowel are distended and predominantly fluid-filled throughout. Distal terminal ileum is decompressed. There appears to partial resection of the colon with and ileal colonic anastomosis. Changes suggest small bowel obstruction at the level of the distal ileum, transition zone appears to be in the right lower mid abdomen. Diverticulosis of the sigmoid colon. Mild stranding around the sigmoid colon could also represent diverticulitis. No abscess. Vascular/Lymphatic: Mild calcification and torsion of the aorta. No significant lymphadenopathy. Reproductive: Status post  hysterectomy. No adnexal masses. Other: Postoperative changes in the anterior abdominal wall consistent with mesh hernia repair. There appears to be and open incision or scar centrally. No abscess. Small left inguinal hernia containing fat. No free air or free fluid in the abdomen. Musculoskeletal: Degenerative changes and scoliosis of the lumbar spine. IMPRESSION: Diffusely dilated fluid-filled small bowel with transition zone in the distal ileum consistent with small bowel obstruction. Stomach and esophagus are also distended with large esophageal hiatal hernia. Ileocolonic anastomosis. Small left inguinal hernia containing fat. Diverticulosis of the sigmoid colon with stranding in the pelvic fat possibly indicating superimposed diverticulitis. No abscess Electronically Signed   By: Lucienne Capers M.D.   On: 12/18/2015 03:01    Lab Data:  CBC:  Recent Labs Lab 12/17/15 2359  WBC 17.1*  NEUTROABS 14.3*  HGB 13.9  HCT 41.2  MCV 93.8  PLT [id number]*   Basic Metabolic Panel:  Recent Labs Lab 12/17/15 2359 12/18/15 0635  NA 132* 130*  K 4.6 4.5  CL 95* 96*  CO2 27 23  GLUCOSE 139* 131*  BUN 11 12  CREATININE 1.00 0.87  CALCIUM 10.5* 9.6  MG  --  2.0   GFR: Estimated Creatinine Clearance: 43.7 mL/min (by C-G formula based on SCr of 0.87 mg/dL). Liver Function Tests:  Recent Labs Lab 12/17/15 2359  AST 19  ALT 11*  ALKPHOS 83  BILITOT 0.7  PROT 8.2*  ALBUMIN 2.9*   No results for input(s): LIPASE, AMYLASE in the last 168 hours. No results for input(s): AMMONIA in the last 168 hours. Coagulation Profile: No results for input(s): INR, PROTIME in the last 168 hours. Cardiac Enzymes: No results for input(s): CKTOTAL, CKMB, CKMBINDEX, TROPONINI in the last 168 hours. BNP (last 3 results) No results for input(s): PROBNP in the last 8760 hours. HbA1C: No results for input(s): HGBA1C in the last 72 hours. CBG: No results for input(s): GLUCAP in the last 168 hours. Lipid  Profile: No results for input(s): CHOL, HDL, LDLCALC, TRIG, CHOLHDL, LDLDIRECT in the last 72 hours. Thyroid Function Tests: No results for input(s): TSH, T4TOTAL, FREET4, T3FREE, THYROIDAB in the last 72 hours. Anemia Panel: No results for input(s): VITAMINB12, FOLATE, FERRITIN, TIBC, IRON, RETICCTPCT in the last 72 hours. Urine analysis:    Component Value Date/Time   COLORURINE YELLOW 08/04/2014 2100   APPEARANCEUR  CLOUDY (A) 08/04/2014 2100   LABSPEC 1.019 08/04/2014 2100   PHURINE 5.0 08/04/2014 2100   GLUCOSEU NEGATIVE 08/04/2014 2100   HGBUR NEGATIVE 08/04/2014 2100   [place] NEGATIVE 08/04/2014 2100   [place] NEGATIVE 08/04/2014 2100   PROTEINUR NEGATIVE 08/04/2014 2100   UROBILINOGEN 0.2 08/04/2014 2100   NITRITE NEGATIVE 08/04/2014 2100   LEUKOCYTESUR NEGATIVE 08/04/2014 2100     Chyan Carnero M.D. [facility] 12/18/2015, 10:34 AM  Pager: [phone] Between 7am to 7pm - call Pager - [phone]  After 7pm go to [email or web address] - password [email or web address]  Call night coverage person covering after 7pm

## 2015-12-18 NOTE — ED Notes (Signed)
Pt continues to c/o nausea, noted to be eating a cracker.

## 2015-12-19 ENCOUNTER — Inpatient Hospital Stay (HOSPITAL_COMMUNITY): Payer: PPO

## 2015-12-19 LAB — CBC
HCT: 33.9 % — ABNORMAL LOW (ref 36.0–46.0)
Hemoglobin: 10.6 g/dL — ABNORMAL LOW (ref 12.0–15.0)
MCH: 30 pg (ref 26.0–34.0)
MCHC: 31.3 g/dL (ref 30.0–36.0)
MCV: 96 fL (ref 78.0–100.0)
PLATELETS: 381 10*3/uL (ref 150–400)
RBC: 3.53 MIL/uL — ABNORMAL LOW (ref 3.87–5.11)
RDW: 14.2 % (ref 11.5–15.5)
WBC: 7.1 10*3/uL (ref 4.0–10.5)

## 2015-12-19 LAB — URINE CULTURE: CULTURE: NO GROWTH

## 2015-12-19 LAB — BASIC METABOLIC PANEL
Anion gap: 7 (ref 5–15)
BUN: 13 mg/dL (ref 6–20)
CHLORIDE: 104 mmol/L (ref 101–111)
CO2: 26 mmol/L (ref 22–32)
CREATININE: 0.88 mg/dL (ref 0.44–1.00)
Calcium: 9.3 mg/dL (ref 8.9–10.3)
GFR, EST NON AFRICAN AMERICAN: 59 mL/min — AB (ref >60)
Glucose, Bld: 96 mg/dL (ref 65–99)
POTASSIUM: 4.1 mmol/L (ref 3.5–5.1)
SODIUM: 137 mmol/L (ref 135–145)

## 2015-12-19 MED ORDER — LORAZEPAM 2 MG/ML IJ SOLN
0.5000 mg | Freq: Once | INTRAMUSCULAR | Status: AC
  Administered 2015-12-19: 0.5 mg via INTRAVENOUS
  Filled 2015-12-19: qty 1

## 2015-12-19 MED ORDER — IOPAMIDOL (ISOVUE-M 300) INJECTION 61%
INTRAMUSCULAR | Status: AC
  Filled 2015-12-19: qty 15

## 2015-12-19 MED ORDER — LIDOCAINE VISCOUS 2 % MT SOLN
OROMUCOSAL | Status: AC
  Filled 2015-12-19: qty 15

## 2015-12-19 MED ORDER — LORAZEPAM 0.5 MG PO TABS: 0.5000 mg | ORAL_TABLET | Freq: Once | ORAL | Status: DC

## 2015-12-19 NOTE — Progress Notes (Signed)
Patient ID: Monique Russo, female   DOB: 02/23/1931, 82 y.o.   MRN: [id number]  [facility] Surgery Progress Note     Subjective: Denies any current nausea but states that she still has significant abdominal pain. The pain is global about her abdomen. Does not yet have NGT in place. Had BM yesterday following a suppository. No flatus today.   Objective: Vital signs in last 24 hours: Temp:  [98.3 F (36.8 C)-98.8 F (37.1 C)] 98.3 F (36.8 C) (10/01 0424) Pulse Rate:  [88-99] 98 (10/01 0424) Resp:  [12-18] 16 (10/01 0424) BP: (128-161)/(52-91) 133/74 (10/01 0424) SpO2:  [91 %-96 %] 96 % (10/01 0424) Weight:  [160 lb (72.6 kg)] 160 lb (72.6 kg) (09/30 1238) Last BM Date: 12/18/15  Intake/Output from previous day: 09/30 0701 - 10/01 0700 In: 3243.8 [I.V.:2393.8; IV Piggyback:850] Out: 550 [Urine:550] Intake/Output this shift: No intake/output data recorded.  PE: Gen:  Alert, NAD, pleasant Abd: Soft, moderately distended, +BS, very tender in all 4 quadrants, small open wound along the upper midline incision (dressing in place), no cellulitis  Lab Results:   Recent Labs  12/18/15 1510 12/19/15 0528  WBC 8.3 7.1  HGB 12.6 10.6*  HCT 38.7 33.9*  PLT 463* 381   BMET  Recent Labs  12/18/15 1510 12/19/15 0528  NA 130* 137  K 4.2 4.1  CL 95* 104  CO2 23 26  GLUCOSE 119* 96  BUN 15 13  CREATININE 1.19* 0.88  CALCIUM 9.6 9.3   PT/INR No results for input(s): LABPROT, INR in the last 72 hours. CMP     Component Value Date/Time   NA 137 12/19/2015 0528   K 4.1 12/19/2015 0528   CL 104 12/19/2015 0528   CO2 26 12/19/2015 0528   GLUCOSE 96 12/19/2015 0528   BUN 13 12/19/2015 0528   CREATININE 0.88 12/19/2015 0528   CALCIUM 9.3 12/19/2015 0528   PROT 7.1 12/18/2015 1510   ALBUMIN 2.7 (L) 12/18/2015 1510   AST 27 12/18/2015 1510   ALT 11 (L) 12/18/2015 1510   ALKPHOS 74 12/18/2015 1510   BILITOT 0.5 12/18/2015 1510   GFRNONAA 59 (L) 12/19/2015 0528    GFRAA >60 12/19/2015 0528   Lipase     Component Value Date/Time   LIPASE 12 (L) 08/06/2014 2020       Studies/Results: Dg Chest 2 View  Result Date: 12/18/2015 CLINICAL DATA:  82 y/o F; abdominal surgery last week, suspected sepsis. EXAM: CHEST  2 VIEW COMPARISON:  08/05/2014 chest radiograph. FINDINGS: Right mid and lower lung zone opacity is consistent with a large hiatal hernia seen on prior CT. Linear and patchy opacities of the lungs bilaterally, predominantly perihilar. Underlying pneumonia is not excluded. No pneumothorax or pleural effusion. Bilateral reverse total shoulder replacements and degenerative changes of the spine. Stable cardiac silhouette. IMPRESSION: Large right-sided hiatal hernia. Linear and patchy lung opacities probably represent mild edema and/or atelectasis. Pneumonia is not excluded. Electronically Signed   By: Kristine Garbe M.D.   On: 12/18/2015 01:28   Ct Abdomen Pelvis W Contrast  Result Date: 12/18/2015 CLINICAL DATA:  Abdominal pain and vomiting. Perforated ulcer 1 year ago with subsequent surgery. Problems since then. EXAM: CT ABDOMEN AND PELVIS WITH CONTRAST TECHNIQUE: Multidetector CT imaging of the abdomen and pelvis was performed using the standard protocol following bolus administration of intravenous contrast. CONTRAST:  140mL ISOVUE-300 IOPAMIDOL (ISOVUE-300) INJECTION 61% COMPARISON:  10/27/2015 FINDINGS: Lower chest: Atelectasis in the lung bases. Hepatobiliary: Gallbladder and bile  ducts are mildly dilated. No stones or obstructing lesion appreciated. Pancreas: Pancreas is fatty replaced. Spleen: Normal in size without focal abnormality. Adrenals/Urinary Tract: No adrenal gland nodules. Atrophy and scarring of the kidneys. No hydronephrosis. Central calcification in the right renal hilum may represent a distal renal artery aneurysm. Cyst on the left kidney. No bladder wall thickening bladder stones. No adrenal gland nodules. Stomach/Bowel:  Large esophageal hiatal hernia with distention of the stomach and distal esophagus. Small bowel are distended and predominantly fluid-filled throughout. Distal terminal ileum is decompressed. There appears to partial resection of the colon with and ileal colonic anastomosis. Changes suggest small bowel obstruction at the level of the distal ileum, transition zone appears to be in the right lower mid abdomen. Diverticulosis of the sigmoid colon. Mild stranding around the sigmoid colon could also represent diverticulitis. No abscess. Vascular/Lymphatic: Mild calcification and torsion of the aorta. No significant lymphadenopathy. Reproductive: Status post hysterectomy. No adnexal masses. Other: Postoperative changes in the anterior abdominal wall consistent with mesh hernia repair. There appears to be and open incision or scar centrally. No abscess. Small left inguinal hernia containing fat. No free air or free fluid in the abdomen. Musculoskeletal: Degenerative changes and scoliosis of the lumbar spine. IMPRESSION: Diffusely dilated fluid-filled small bowel with transition zone in the distal ileum consistent with small bowel obstruction. Stomach and esophagus are also distended with large esophageal hiatal hernia. Ileocolonic anastomosis. Small left inguinal hernia containing fat. Diverticulosis of the sigmoid colon with stranding in the pelvic fat possibly indicating superimposed diverticulitis. No abscess Electronically Signed   By: Lucienne Capers M.D.   On: 12/18/2015 03:01    Anti-infectives: Anti-infectives    Start     Dose/Rate Route Frequency Ordered Stop   12/18/15 0430  ciprofloxacin (CIPRO) IVPB 400 mg     400 mg 200 mL/hr over 60 Minutes Intravenous 2 times daily 12/18/15 0427     12/18/15 0430  metroNIDAZOLE (FLAGYL) IVPB 500 mg     500 mg 100 mL/hr over 60 Minutes Intravenous Every 8 hours 12/18/15 0427     12/18/15 0345  piperacillin-tazobactam (ZOSYN) IVPB 3.375 g  Status:  Discontinued      3.375 g 100 mL/hr over 30 Minutes Intravenous  Once 12/18/15 0330 12/18/15 0427       Assessment/Plan S/P laparoscopic incisional hernia repair with Goretex mesh by Dr. Zella Richer about 20y ago - Dr. Excell Seltzer has been following her in the office and has periodically debrided some of the Gore-Tex mesh from her wound - continue dressing changes S/p repair perforated gastric ulcer 07/2014 Dr. Excell Seltzer SBO - XR this AM suggests partial small bowel obstruction - pending NGT placement by IR - BM yesterday following suppository but she is not passing flatus ?Diverticulitis - very tender today upper and lower abdomen - on cipro/flagyl since yesterday, will continue  Hyponatremia - 137 today Chronic pain GERD - IV famotidine for now, likely transition back to PPI when no longer NPO  ID - cipro/flagyl day 2 FEN - NPO, IVF VTE - lovenox  Plan - pending NGT placement by IR. Will consider small bowel protocol once NGT placed. Continue NPO. Will continue antibiotics today due to lower abdominal tenderness. XR in AM.    LOS: 1 day    Jerrye Beavers , [facility] Surgery 12/19/2015, 8:25 AM Pager: [phone] Consults: [phone] Mon-Fri 7:00 am-4:30 pm Sat-Sun 7:00 am-11:30 am

## 2015-12-19 NOTE — Progress Notes (Signed)
Patient was noted with a red bag on her table tray, when nurse tech checked contents, she found food and drinks, reminded patient that she can't have anything by mouth. Nurse tech removed red bag out of reach and will remind family members not to bring or give her anything by mouth until the doctor allows it.

## 2015-12-19 NOTE — Progress Notes (Signed)
[facility]                                                                              Patient Demographics  Monique Russo, is a 82 y.o. female, DOB - [birth year], [id number]  Admit date - 12/17/2015   Admitting Physician Edwin Dada, MD  Outpatient Primary MD for the patient is Horatio Pel, MD  Outpatient specialists:   LOS - 1  days    Chief Complaint  Patient presents with  . Abdominal Pain       Brief summary   Monique Russo is a 82 y.o. female with a past medical history significant for chronic pain on methadone and chronic non-healing surgical wound who presented with abdominal pain and vomiting for 3 days. The patient had a perforated ulcer 1 year ago requiring [name] patch. This surgery was unfortunately complicated by nonhealing surgical wound (because of 82 year old infected hernia repair mesh?), Which for the last year the patient has been having wound care by her general surgeon in the office every six weeks (per family). This week, two days prior to admission, the patient had an office debridement and described that Dr. Excell Seltzer did his usual debridement, after which she began to develop nausea and LLQ abdominal pain.  This crampy, severe, LLQ aching abdominal pain continued intermittently and was associated with several episodes of nonbloody emesis and obstipation. On the morning of admission, she was recommended by surgery office for ER evaluation. CT of the abdomen and pelvis was obtained that showed small bowel obstruction with transition point in the right mid abdomen and stranding around the colon, consistent with diverticulitis. Surgery was consulted and patient was admitted for further workup.   Assessment & Plan    Principal Problem: Small bowel obstruction -Continue  NPO status, IV fluids  - Multiple unsuccessful attempts in ED for NG placement, requested NG placement under fluoroscopy guidance  - Continue pain  control and antiemetics - Surgery following - Apparently patient's family members bringing food, during my examination I noticed patient holding onto the coke bottle and red bag with crackers and candies.   Diverticulitis:  There is stranding around the colon that suggests diverticulitis. -Stopped Augmentin -Placed on Ciprofloxacin and Flagyl IV for today   Hyponatremia:  -Improving, continue  IV fluid hydration   Chronic pain syndrome:  -Hold methadone and hydrocodone while NPO   GERD and hiatal hernia: -Replace PPI with IV famotidine while NPO   Code Status: full DVT Prophylaxis: SCD's Family Communication: Discussed in detail with the patient, all imaging results, lab results explained to the patient   Disposition Plan: Time Spent in minutes  15 minutes  Procedures:  CT abd  Consultants:   Surgery- Dr Grandville Silos  Antimicrobials:   IV ciprofloxacin 9/30  IV Flagyl 9/30   Medications  Scheduled Meds: . ciprofloxacin  400 mg Intravenous BID  . enoxaparin (LOVENOX) injection  40 mg Subcutaneous Q24H  . famotidine (PEPCID) IV  20 mg Intravenous Q24H  . iopamidol      . iopamidol      . lidocaine      . metronidazole  500 mg  Intravenous Q8H  . timolol  1 drop Both Eyes QHS  . timolol  2 drop Both Eyes Daily   Continuous Infusions: . sodium chloride 75 mL/hr at 12/18/15 2258   PRN Meds:.HYDROmorphone (DILAUDID) injection, LORazepam, ondansetron (ZOFRAN) IV   Antibiotics   Anti-infectives    Start     Dose/Rate Route Frequency Ordered Stop   12/18/15 0430  ciprofloxacin (CIPRO) IVPB 400 mg     400 mg 200 mL/hr over 60 Minutes Intravenous 2 times daily 12/18/15 0427     12/18/15 0430  metroNIDAZOLE (FLAGYL) IVPB 500 mg     500 mg 100 mL/hr over 60 Minutes Intravenous Every 8 hours 12/18/15 0427     12/18/15 0345  piperacillin-tazobactam (ZOSYN) IVPB 3.375 g  Status:  Discontinued     3.375 g 100 mL/hr over 30 Minutes Intravenous  Once 12/18/15 0330  12/18/15 [id number]        Subjective:   Monique Russo was seen and examined today.Patient sleeping and requesting not to be bothered. Denies any abdominal pain at this time, no nausea or vomiting at the time of my encounter. No fevers.  No chest pain or shortness of breath.  Objective:   Vitals:   12/18/15 1200 12/18/15 1238 12/18/15 2215 12/19/15 0424  BP: 137/78 (!) 161/77 128/70 133/74  Pulse: 96 94 88 98  Resp: 16 16 18 16   Temp:  98.8 F (37.1 C) 98.6 F (37 C) 98.3 F (36.8 C)  TempSrc:  Oral Oral Oral  SpO2: 92% 95% 93% 96%  Weight:  72.6 kg (160 lb)    Height:  5' (1.524 m)      Intake/Output Summary (Last 24 hours) at 12/19/15 1247 Last data filed at 12/19/15 1126  Gross per 24 hour  Intake          2943.75 ml  Output              800 ml  Net          2143.75 ml     Wt Readings from Last 3 Encounters:  12/18/15 72.6 kg (160 lb)  11/26/14 83.1 kg (183 lb 1.6 oz)  08/06/14 89.4 kg (197 lb 1.5 oz)     Exam  General: Sleepy   HEENT:    Neck: Supple, no JVD  Cardiovascular: S1 S2 auscultated, no rubs, murmurs or gallops. Regular rate and rhythm.  Respiratory: Clear to auscultation bilaterally, no wheezing, rales or rhonchi  Gastrointestinal: Soft, dressing intact, mild tenderness, hypoactive bowel sounds  Ext: no cyanosis clubbing, + edema  Neuro:   Skin: No rashes  Psych: sleepy    Data Reviewed:  I have personally reviewed following labs and imaging studies  Micro Results Recent Results (from the past 240 hour(s))  MRSA PCR Screening     Status: None   Collection Time: 12/18/15  2:03 PM  Result Value Ref Range Status   MRSA by PCR NEGATIVE NEGATIVE Final    Comment:        The GeneXpert MRSA Assay (FDA approved for NASAL specimens only), is one component of a comprehensive MRSA colonization surveillance program. It is not intended to diagnose MRSA infection nor to guide or monitor treatment for MRSA infections.     Radiology  Reports Dg Chest 2 View  Result Date: 12/18/2015 CLINICAL DATA:  82 y/o F; abdominal surgery last week, suspected sepsis. EXAM: CHEST  2 VIEW COMPARISON:  08/05/2014 chest radiograph. FINDINGS: Right mid and lower lung zone opacity is consistent  with a large hiatal hernia seen on prior CT. Linear and patchy opacities of the lungs bilaterally, predominantly perihilar. Underlying pneumonia is not excluded. No pneumothorax or pleural effusion. Bilateral reverse total shoulder replacements and degenerative changes of the spine. Stable cardiac silhouette. IMPRESSION: Large right-sided hiatal hernia. Linear and patchy lung opacities probably represent mild edema and/or atelectasis. Pneumonia is not excluded. Electronically Signed   By: Kristine Garbe M.D.   On: 12/18/2015 01:28   Ct Abdomen Pelvis W Contrast  Result Date: 12/18/2015 CLINICAL DATA:  Abdominal pain and vomiting. Perforated ulcer 1 year ago with subsequent surgery. Problems since then. EXAM: CT ABDOMEN AND PELVIS WITH CONTRAST TECHNIQUE: Multidetector CT imaging of the abdomen and pelvis was performed using the standard protocol following bolus administration of intravenous contrast. CONTRAST:  13mL ISOVUE-300 IOPAMIDOL (ISOVUE-300) INJECTION 61% COMPARISON:  10/27/2015 FINDINGS: Lower chest: Atelectasis in the lung bases. Hepatobiliary: Gallbladder and bile ducts are mildly dilated. No stones or obstructing lesion appreciated. Pancreas: Pancreas is fatty replaced. Spleen: Normal in size without focal abnormality. Adrenals/Urinary Tract: No adrenal gland nodules. Atrophy and scarring of the kidneys. No hydronephrosis. Central calcification in the right renal hilum may represent a distal renal artery aneurysm. Cyst on the left kidney. No bladder wall thickening bladder stones. No adrenal gland nodules. Stomach/Bowel: Large esophageal hiatal hernia with distention of the stomach and distal esophagus. Small bowel are distended and  predominantly fluid-filled throughout. Distal terminal ileum is decompressed. There appears to partial resection of the colon with and ileal colonic anastomosis. Changes suggest small bowel obstruction at the level of the distal ileum, transition zone appears to be in the right lower mid abdomen. Diverticulosis of the sigmoid colon. Mild stranding around the sigmoid colon could also represent diverticulitis. No abscess. Vascular/Lymphatic: Mild calcification and torsion of the aorta. No significant lymphadenopathy. Reproductive: Status post hysterectomy. No adnexal masses. Other: Postoperative changes in the anterior abdominal wall consistent with mesh hernia repair. There appears to be and open incision or scar centrally. No abscess. Small left inguinal hernia containing fat. No free air or free fluid in the abdomen. Musculoskeletal: Degenerative changes and scoliosis of the lumbar spine. IMPRESSION: Diffusely dilated fluid-filled small bowel with transition zone in the distal ileum consistent with small bowel obstruction. Stomach and esophagus are also distended with large esophageal hiatal hernia. Ileocolonic anastomosis. Small left inguinal hernia containing fat. Diverticulosis of the sigmoid colon with stranding in the pelvic fat possibly indicating superimposed diverticulitis. No abscess Electronically Signed   By: Lucienne Capers M.D.   On: 12/18/2015 03:01   Dg Abd Portable 1v  Result Date: 12/19/2015 CLINICAL DATA:  82 year old female with history of small-bowel obstruction. EXAM: PORTABLE ABDOMEN - 1 VIEW COMPARISON:  Abdominal radiograph 08/06/2014. FINDINGS: Gas and stool are seen scattered throughout the colon extending to the level of the distal rectum. There are a few mildly dilated loops of small bowel, most evident in the left lower quadrant of the abdomen measuring up to 3.6 cm in diameter. No gross evidence of pneumoperitoneum. Numerous soft tissue anchors are seen projecting over the  abdomen, presumably from prior mesh repair for ventral hernia. Iodinated contrast material within the lumen of the urinary bladder related to recent CT examination. Dextroscoliosis of the lumbar spine convex to the right at the level of L2. Severe multilevel degenerative disc disease in the thoracolumbar spine. IMPRESSION: 1. Findings again suggest partial small bowel obstruction. 2. No pneumoperitoneum. Electronically Signed   By: Vinnie Langton M.D.   On: 12/19/2015  08:40    Lab Data:  CBC:  Recent Labs Lab 12/17/15 2359 12/18/15 1510 12/19/15 0528  WBC 17.1* 8.3 7.1  NEUTROABS 14.3*  --   --   HGB 13.9 12.6 10.6*  HCT 41.2 38.7 33.9*  MCV 93.8 96.3 96.0  PLT 506* 463* [id number]   Basic Metabolic Panel:  Recent Labs Lab 12/17/15 2359 12/18/15 0635 12/18/15 1510 12/19/15 0528  NA 132* 130* 130* 137  K 4.6 4.5 4.2 4.1  CL 95* 96* 95* 104  CO2 27 23 23 26   GLUCOSE 139* 131* 119* 96  BUN 11 12 15 13   CREATININE 1.00 0.87 1.19* 0.88  CALCIUM 10.5* 9.6 9.6 9.3  MG  --  2.0  --   --    GFR: Estimated Creatinine Clearance: 43.1 mL/min (by C-G formula based on SCr of 0.88 mg/dL). Liver Function Tests:  Recent Labs Lab 12/17/15 2359 12/18/15 1510  AST 19 27  ALT 11* 11*  ALKPHOS 83 74  BILITOT 0.7 0.5  PROT 8.2* 7.1  ALBUMIN 2.9* 2.7*   No results for input(s): LIPASE, AMYLASE in the last 168 hours. No results for input(s): AMMONIA in the last 168 hours. Coagulation Profile: No results for input(s): INR, PROTIME in the last 168 hours. Cardiac Enzymes: No results for input(s): CKTOTAL, CKMB, CKMBINDEX, TROPONINI in the last 168 hours. BNP (last 3 results) No results for input(s): PROBNP in the last 8760 hours. HbA1C: No results for input(s): HGBA1C in the last 72 hours. CBG: No results for input(s): GLUCAP in the last 168 hours. Lipid Profile: No results for input(s): CHOL, HDL, LDLCALC, TRIG, CHOLHDL, LDLDIRECT in the last 72 hours. Thyroid Function Tests: No  results for input(s): TSH, T4TOTAL, FREET4, T3FREE, THYROIDAB in the last 72 hours. Anemia Panel: No results for input(s): VITAMINB12, FOLATE, FERRITIN, TIBC, IRON, RETICCTPCT in the last 72 hours. Urine analysis:    Component Value Date/Time   COLORURINE AMBER (A) 12/18/2015 1507   APPEARANCEUR CLEAR 12/18/2015 1507   LABSPEC >1.046 (H) 12/18/2015 1507   PHURINE 5.5 12/18/2015 1507   GLUCOSEU NEGATIVE 12/18/2015 1507   HGBUR NEGATIVE 12/18/2015 1507   BILIRUBINUR SMALL (A) 12/18/2015 1507   KETONESUR NEGATIVE 12/18/2015 1507   PROTEINUR NEGATIVE 12/18/2015 1507   UROBILINOGEN 0.2 08/04/2014 2100   NITRITE NEGATIVE 12/18/2015 1507   LEUKOCYTESUR NEGATIVE 12/18/2015 1507     Monique Russo M.D. [facility] 12/19/2015, 12:47 PM  Pager: [phone] Between 7am to 7pm - call Pager - [phone]  After 7pm go to [email or web address] - password [email or web address]  Call night coverage person covering after 7pm

## 2015-12-19 NOTE — Progress Notes (Signed)
Attempted to insert NGT tube but unsuccessful.

## 2015-12-20 ENCOUNTER — Inpatient Hospital Stay (HOSPITAL_COMMUNITY): Payer: PPO

## 2015-12-20 LAB — BASIC METABOLIC PANEL
ANION GAP: 7 (ref 5–15)
BUN: 9 mg/dL (ref 6–20)
CO2: 23 mmol/L (ref 22–32)
CREATININE: 0.76 mg/dL (ref 0.44–1.00)
Calcium: 8.7 mg/dL — ABNORMAL LOW (ref 8.9–10.3)
Chloride: 106 mmol/L (ref 101–111)
GFR calc non Af Amer: >60 mL/min (ref >60)
GLUCOSE: 86 mg/dL (ref 65–99)
Potassium: 3.6 mmol/L (ref 3.5–5.1)
Sodium: 136 mmol/L (ref 135–145)

## 2015-12-20 LAB — CBC
HCT: 32.1 % — ABNORMAL LOW (ref 36.0–46.0)
HEMOGLOBIN: 10.1 g/dL — AB (ref 12.0–15.0)
MCH: 30.3 pg (ref 26.0–34.0)
MCHC: 31.5 g/dL (ref 30.0–36.0)
MCV: 96.4 fL (ref 78.0–100.0)
Platelets: 366 10*3/uL (ref 150–400)
RBC: 3.33 MIL/uL — AB (ref 3.87–5.11)
RDW: 14.3 % (ref 11.5–15.5)
WBC: 8.5 10*3/uL (ref 4.0–10.5)

## 2015-12-20 MED ORDER — DIATRIZOATE MEGLUMINE & SODIUM 66-10 % PO SOLN
ORAL | Status: AC
  Filled 2015-12-20: qty 90

## 2015-12-20 MED ORDER — CIPROFLOXACIN IN D5W 400 MG/200ML IV SOLN
400.0000 mg | Freq: Two times a day (BID) | INTRAVENOUS | Status: DC
  Administered 2015-12-20 – 2015-12-25 (×11): 400 mg via INTRAVENOUS
  Filled 2015-12-20 (×12): qty 200

## 2015-12-20 MED ORDER — DIATRIZOATE MEGLUMINE & SODIUM 66-10 % PO SOLN
90.0000 mL | Freq: Once | ORAL | Status: DC
  Filled 2015-12-20: qty 90

## 2015-12-20 MED ORDER — DIPHENHYDRAMINE HCL 50 MG/ML IJ SOLN
12.5000 mg | Freq: Once | INTRAMUSCULAR | Status: AC
Start: 2015-12-20 — End: 2015-12-20
  Administered 2015-12-20: 12.5 mg via INTRAVENOUS
  Filled 2015-12-20: qty 1

## 2015-12-20 MED ORDER — METRONIDAZOLE IN NACL 5-0.79 MG/ML-% IV SOLN
500.0000 mg | Freq: Three times a day (TID) | INTRAVENOUS | Status: DC
  Administered 2015-12-20 – 2015-12-25 (×14): 500 mg via INTRAVENOUS
  Filled 2015-12-20 (×18): qty 100

## 2015-12-20 NOTE — Progress Notes (Signed)
[facility]                                                                              Patient Demographics  Monique Russo, is a 82 y.o. female, DOB - 05/23/1930, YTK:[id number]  Admit date - 12/17/2015   Admitting Physician Edwin Dada, MD  Outpatient Primary MD for the patient is Horatio Pel, MD  Outpatient specialists:   LOS - 2  days    Chief Complaint  Patient presents with  . Abdominal Pain       Brief summary   Monique Russo is a 82 y.o. female with a past medical history significant for chronic pain on methadone and chronic non-healing surgical wound who presented with abdominal pain and vomiting for 3 days. The patient had a perforated ulcer 1 year ago requiring [name] patch. This surgery was unfortunately complicated by nonhealing surgical wound (because of 82 year old infected hernia repair mesh?), Which for the last year the patient has been having wound care by her general surgeon in the office every six weeks (per family). This week, two days prior to admission, the patient had an office debridement and described that Dr. Excell Seltzer did his usual debridement, after which she began to develop nausea and LLQ abdominal pain.  This crampy, severe, LLQ aching abdominal pain continued intermittently and was associated with several episodes of nonbloody emesis and obstipation. On the morning of admission, she was recommended by surgery office for ER evaluation. CT of the abdomen and pelvis was obtained that showed small bowel obstruction with transition point in the right mid abdomen and stranding around the colon, consistent with diverticulitis. Surgery was consulted and patient was admitted for further workup.   Assessment & Plan    Principal Problem: Small bowel obstruction -Continue  NPO status, IV fluids  - Multiple unsuccessful attempts in ED and under fluro for NG placement guidance  - Continue pain control and antiemetics -  Surgery following - Discussed with radiology yesterday due to multiple unsuccessful attempts for NG placement and under fluoroscopic guidance. Per Dr. Orlene Plum, patient has a large hiatal hernia however he still could not place the NG. Will obtain barium swallow study to rule out any mechanical obstruction/ mass, explained to the patient's daughter at the bedside.   Diverticulitis:  There is stranding around the colon that suggests diverticulitis. -Stopped Augmentin -Placed on Ciprofloxacin and Flagyl IV    Hyponatremia:  -Improving, continue  IV fluid hydration   Chronic pain syndrome:  -Hold methadone and hydrocodone while NPO   GERD and hiatal hernia: -Replace PPI with IV famotidine while NPO   Code Status: full DVT Prophylaxis: SCD's Family Communication: Discussed in detail with the patient, all imaging results, lab results explained to the patient and patient's daughter at the bedside   Disposition Plan: Time Spent in minutes  15 minutes  Procedures:  CT abd  Consultants:   Surgery- Dr Grandville Silos  Antimicrobials:   IV ciprofloxacin 9/30  IV Flagyl 9/30   Medications  Scheduled Meds: . diatrizoate meglumine-sodium  90 mL Oral Once  . diatrizoate meglumine-sodium      . enoxaparin (LOVENOX) injection  40 mg Subcutaneous Q24H  . famotidine (PEPCID) IV  20 mg Intravenous Q24H  . timolol  1 drop Both Eyes QHS  . timolol  2 drop Both Eyes Daily   Continuous Infusions: . sodium chloride 75 mL/hr at 12/20/15 0520   PRN Meds:.HYDROmorphone (DILAUDID) injection, ondansetron (ZOFRAN) IV   Antibiotics   Anti-infectives    Start     Dose/Rate Route Frequency Ordered Stop   12/18/15 0430  ciprofloxacin (CIPRO) IVPB 400 mg  Status:  Discontinued     400 mg 200 mL/hr over 60 Minutes Intravenous 2 times daily 12/18/15 0427 12/20/15 0904   12/18/15 0430  metroNIDAZOLE (FLAGYL) IVPB 500 mg  Status:  Discontinued     500 mg 100 mL/hr over 60 Minutes Intravenous  Every 8 hours 12/18/15 0427 12/20/15 0904   12/18/15 0345  piperacillin-tazobactam (ZOSYN) IVPB 3.375 g  Status:  Discontinued     3.375 g 100 mL/hr over 30 Minutes Intravenous  Once 12/18/15 0330 12/18/15 9675        Subjective:   Monique Russo was seen and examined today. Alert and awake, denies any specific complaints. Minimal abdominal pain. No fevers.  No chest pain or shortness of breath, nausea, vomiting, weakness, numbness or tingling.  Objective:   Vitals:   12/19/15 0424 12/19/15 1300 12/19/15 2246 12/20/15 0648  BP: 133/74 140/81 122/73 (!) 140/59  Pulse: 98 87 99 91  Resp: '16 16 19 17  ' Temp: 98.3 F (36.8 C) 98.7 F (37.1 C) 99.1 F (37.3 C) 98.7 F (37.1 C)  TempSrc: Oral Oral Oral   SpO2: 96% 93% 93% 94%  Weight:      Height:        Intake/Output Summary (Last 24 hours) at 12/20/15 0906 Last data filed at 12/19/15 2246  Gross per 24 hour  Intake              900 ml  Output              650 ml  Net              250 ml     Wt Readings from Last 3 Encounters:  12/18/15 72.6 kg (160 lb)  11/26/14 83.1 kg (183 lb 1.6 oz)  08/06/14 89.4 kg (197 lb 1.5 oz)     Exam  General: Alert and oriented 3, NAD  HEENT:    Neck: Supple, no JVD  Cardiovascular: S1 S2 auscultated, no rubs, murmurs or gallops. Regular rate and rhythm.  Respiratory: Clear to auscultation bilaterally, no wheezing, rales or rhonchi  Gastrointestinal: Soft, dressing intact, mild tenderness, hypoactive bowel sounds  Ext: no cyanosis clubbing, + edema  Neuro: no focal neurological deficits  Skin: No rashes  Psych: alert and oriented, normal mood and affect   Data Reviewed:  I have personally reviewed following labs and imaging studies  Micro Results Recent Results (from the past 240 hour(s))  Urine culture     Status: None   Collection Time: 12/17/15  3:07 PM  Result Value Ref Range Status   Specimen Description URINE, RANDOM  Final   Special Requests NONE  Final    Culture NO GROWTH  Final   Report Status 12/19/2015 FINAL  Final  Culture, blood (Routine x 2)     Status: None (Preliminary result)   Collection Time: 12/17/15 11:55 PM  Result Value Ref Range Status   Specimen Description BLOOD RIGHT HAND  Final   Special Requests IN PEDIATRIC BOTTLE 1ML  Final   Culture NO GROWTH 1 DAY  Final   Report Status PENDING  Incomplete  Culture, blood (Routine x 2)     Status: None (Preliminary result)   Collection Time: 12/17/15 11:59 PM  Result Value Ref Range Status   Specimen Description BLOOD RIGHT ARM  Final   Special Requests BOTTLES DRAWN AEROBIC AND ANAEROBIC 5ML  Final   Culture NO GROWTH 1 DAY  Final   Report Status PENDING  Incomplete  MRSA PCR Screening     Status: None   Collection Time: 12/18/15  2:03 PM  Result Value Ref Range Status   MRSA by PCR NEGATIVE NEGATIVE Final    Comment:        The GeneXpert MRSA Assay (FDA approved for NASAL specimens only), is one component of a comprehensive MRSA colonization surveillance program. It is not intended to diagnose MRSA infection nor to guide or monitor treatment for MRSA infections.     Radiology Reports Dg Chest 2 View  Result Date: 12/18/2015 CLINICAL DATA:  82 y/o F; abdominal surgery last week, suspected sepsis. EXAM: CHEST  2 VIEW COMPARISON:  08/05/2014 chest radiograph. FINDINGS: Right mid and lower lung zone opacity is consistent with a large hiatal hernia seen on prior CT. Linear and patchy opacities of the lungs bilaterally, predominantly perihilar. Underlying pneumonia is not excluded. No pneumothorax or pleural effusion. Bilateral reverse total shoulder replacements and degenerative changes of the spine. Stable cardiac silhouette. IMPRESSION: Large right-sided hiatal hernia. Linear and patchy lung opacities probably represent mild edema and/or atelectasis. Pneumonia is not excluded. Electronically Signed   By: Kristine Garbe M.D.   On: 12/18/2015 01:28   Ct Abdomen  Pelvis W Contrast  Result Date: 12/18/2015 CLINICAL DATA:  Abdominal pain and vomiting. Perforated ulcer 1 year ago with subsequent surgery. Problems since then. EXAM: CT ABDOMEN AND PELVIS WITH CONTRAST TECHNIQUE: Multidetector CT imaging of the abdomen and pelvis was performed using the standard protocol following bolus administration of intravenous contrast. CONTRAST:  119m ISOVUE-300 IOPAMIDOL (ISOVUE-300) INJECTION 61% COMPARISON:  10/27/2015 FINDINGS: Lower chest: Atelectasis in the lung bases. Hepatobiliary: Gallbladder and bile ducts are mildly dilated. No stones or obstructing lesion appreciated. Pancreas: Pancreas is fatty replaced. Spleen: Normal in size without focal abnormality. Adrenals/Urinary Tract: No adrenal gland nodules. Atrophy and scarring of the kidneys. No hydronephrosis. Central calcification in the right renal hilum may represent a distal renal artery aneurysm. Cyst on the left kidney. No bladder wall thickening bladder stones. No adrenal gland nodules. Stomach/Bowel: Large esophageal hiatal hernia with distention of the stomach and distal esophagus. Small bowel are distended and predominantly fluid-filled throughout. Distal terminal ileum is decompressed. There appears to partial resection of the colon with and ileal colonic anastomosis. Changes suggest small bowel obstruction at the level of the distal ileum, transition zone appears to be in the right lower mid abdomen. Diverticulosis of the sigmoid colon. Mild stranding around the sigmoid colon could also represent diverticulitis. No abscess. Vascular/Lymphatic: Mild calcification and torsion of the aorta. No significant lymphadenopathy. Reproductive: Status post hysterectomy. No adnexal masses. Other: Postoperative changes in the anterior abdominal wall consistent with mesh hernia repair. There appears to be and open incision or scar centrally. No abscess. Small left inguinal hernia containing fat. No free air or free fluid in the  abdomen. Musculoskeletal: Degenerative changes and scoliosis of the lumbar spine. IMPRESSION: Diffusely dilated fluid-filled small bowel with transition zone in the distal ileum consistent with small bowel obstruction. Stomach and esophagus are also  distended with large esophageal hiatal hernia. Ileocolonic anastomosis. Small left inguinal hernia containing fat. Diverticulosis of the sigmoid colon with stranding in the pelvic fat possibly indicating superimposed diverticulitis. No abscess Electronically Signed   By: Lucienne Capers M.D.   On: 12/18/2015 03:01   Dg Abd Portable 1v  Result Date: 12/20/2015 CLINICAL DATA:  Small bowel obstruction history of previous perforated gastric ulcer in 2016. EXAM: PORTABLE ABDOMEN - 1 VIEW COMPARISON:  Portable supine abdominal radiographs of December 19, 2015 FINDINGS: There remain loops of moderately distended gas-filled small bowel in the mid abdomen. A few loops of normal caliber bowel are noted as well. There is some stool and gas in the colon and rectum. No free extraluminal gas collections are observed. There are degenerative changes of the lumbar spine with moderate levocurvature centered at L1. Numerous coils from previous hernia repair are present. IMPRESSION: Bowel gas pattern consistent with a clinically known partial mid to distal small bowel obstruction. No evidence of perforation currently. Electronically Signed   By: David  Martinique M.D.   On: 12/20/2015 07:46   Dg Abd Portable 1v  Result Date: 12/19/2015 CLINICAL DATA:  82 year old female with history of small-bowel obstruction. EXAM: PORTABLE ABDOMEN - 1 VIEW COMPARISON:  Abdominal radiograph 08/06/2014. FINDINGS: Gas and stool are seen scattered throughout the colon extending to the level of the distal rectum. There are a few mildly dilated loops of small bowel, most evident in the left lower quadrant of the abdomen measuring up to 3.6 cm in diameter. No gross evidence of pneumoperitoneum. Numerous soft  tissue anchors are seen projecting over the abdomen, presumably from prior mesh repair for ventral hernia. Iodinated contrast material within the lumen of the urinary bladder related to recent CT examination. Dextroscoliosis of the lumbar spine convex to the right at the level of L2. Severe multilevel degenerative disc disease in the thoracolumbar spine. IMPRESSION: 1. Findings again suggest partial small bowel obstruction. 2. No pneumoperitoneum. Electronically Signed   By: Vinnie Langton M.D.   On: 12/19/2015 08:40   Dg Addison Bailey G Tube Plc W/fl W/rad  Result Date: 12/19/2015 CLINICAL DATA:  82 year old female with small bowel obstruction in need of nasogastric tube. EXAM: NASO G TUBE PLACEMENT WITH FL AND WITH RAD CONTRAST:  None. FLUOROSCOPY TIME:  Fluoroscopy Time:  6 minutes and 42 seconds COMPARISON:  None. FINDINGS: Despite multiple attempts to fluoroscopically place a nasogastric tube into the patient's stomach, significant resistance was met in the region of the distal third of the esophagus at every attempt, with inability to pass the nasogastric tube beyond the area of apparent physical obstruction. During no time was the tube even able to extend into the intrathoracic portion of the patient's large hiatal hernia. Accordingly, the tube was removed from the patient, and the examination was terminated. IMPRESSION: 1. Unsuccessful attempt to fluoroscopically place a nasogastric tube into the stomach. Based upon review of the recent CT the abdomen and pelvis, this is presumably related to the patient's complex anatomy. The patient has a very large hiatal hernia, within acute tortuosity of the esophagus as it leads toward the hernia. Given the inability to pass the tube, the possibility of a proximal obstructing mass should be considered, and further evaluation with endoscopy, contrast enhanced chest CT, and/or barium swallow is recommended to better evaluate these findings. Electronically Signed   By:  Vinnie Langton M.D.   On: 12/19/2015 15:12    Lab Data:  CBC:  Recent Labs Lab 12/17/15 2359 12/18/15 1510  12/19/15 0528 12/20/15 0541  WBC 17.1* 8.3 7.1 8.5  NEUTROABS 14.3*  --   --   --   HGB 13.9 12.6 10.6* 10.1*  HCT 41.2 38.7 33.9* 32.1*  MCV 93.8 96.3 96.0 96.4  PLT 506* 463* 381 472   Basic Metabolic Panel:  Recent Labs Lab 12/17/15 2359 12/18/15 0635 12/18/15 1510 12/19/15 0528 12/20/15 0541  NA 132* 130* 130* 137 136  K 4.6 4.5 4.2 4.1 3.6  CL 95* 96* 95* 104 106  CO2 '27 23 23 26 23  ' GLUCOSE 139* 131* 119* 96 86  BUN '11 12 15 13 9  ' CREATININE 1.00 0.87 1.19* 0.88 0.76  CALCIUM 10.5* 9.6 9.6 9.3 8.7*  MG  --  2.0  --   --   --    GFR: Estimated Creatinine Clearance: 47.4 mL/min (by C-G formula based on SCr of 0.76 mg/dL). Liver Function Tests:  Recent Labs Lab 12/17/15 2359 12/18/15 1510  AST 19 27  ALT 11* 11*  ALKPHOS 83 74  BILITOT 0.7 0.5  PROT 8.2* 7.1  ALBUMIN 2.9* 2.7*   No results for input(s): LIPASE, AMYLASE in the last 168 hours. No results for input(s): AMMONIA in the last 168 hours. Coagulation Profile: No results for input(s): INR, PROTIME in the last 168 hours. Cardiac Enzymes: No results for input(s): CKTOTAL, CKMB, CKMBINDEX, TROPONINI in the last 168 hours. BNP (last 3 results) No results for input(s): PROBNP in the last 8760 hours. HbA1C: No results for input(s): HGBA1C in the last 72 hours. CBG: No results for input(s): GLUCAP in the last 168 hours. Lipid Profile: No results for input(s): CHOL, HDL, LDLCALC, TRIG, CHOLHDL, LDLDIRECT in the last 72 hours. Thyroid Function Tests: No results for input(s): TSH, T4TOTAL, FREET4, T3FREE, THYROIDAB in the last 72 hours. Anemia Panel: No results for input(s): VITAMINB12, FOLATE, FERRITIN, TIBC, IRON, RETICCTPCT in the last 72 hours. Urine analysis:    Component Value Date/Time   COLORURINE AMBER (A) 12/18/2015 1507   APPEARANCEUR CLEAR 12/18/2015 1507   LABSPEC >1.046  (H) 12/18/2015 1507   PHURINE 5.5 12/18/2015 1507   GLUCOSEU NEGATIVE 12/18/2015 1507   HGBUR NEGATIVE 12/18/2015 1507   BILIRUBINUR SMALL (A) 12/18/2015 1507   KETONESUR NEGATIVE 12/18/2015 1507   PROTEINUR NEGATIVE 12/18/2015 1507   UROBILINOGEN 0.2 08/04/2014 2100   NITRITE NEGATIVE 12/18/2015 1507   LEUKOCYTESUR NEGATIVE 12/18/2015 1507     RAI,RIPUDEEP M.D. [facility] 12/20/2015, 9:06 AM  Pager: [phone] Between 7am to 7pm - call Pager - [phone]  After 7pm go to [email or web address] - password [email or web address]  Call night coverage person covering after 7pm

## 2015-12-20 NOTE — Progress Notes (Signed)
Patient ID: Monique Russo, female   DOB: [birth year], 82 y.o.   MRN: [id number]  [facility] Surgery Progress Note     Subjective: Patient sleeping while I was in room. Spoke with daughter who states that she was about the same yesterday. No BM or flatus yesterday. No nausea or vomiting. NG placement in IR unsuccessful yesterday. Patient went for barium swallow study this morning to rule out any mechanical obstruction/mass.  Objective: Vital signs in last 24 hours: Temp:  [98.7 F (37.1 C)-99.1 F (37.3 C)] 98.7 F (37.1 C) (10/02 0648) Pulse Rate:  [87-99] 91 (10/02 0648) Resp:  [16-19] 17 (10/02 0648) BP: (122-140)/(59-81) 140/59 (10/02 0648) SpO2:  [93 %-94 %] 94 % (10/02 0648) Last BM Date: 12/18/15  Intake/Output from previous day: 10/01 0701 - 10/02 0700 In: 900 [I.V.:750; IV Piggyback:150] Out: 650 [Urine:650] Intake/Output this shift: No intake/output data recorded.  PE: Gen:  sleeping, NAD Abd: Soft, moderately distended, present but hypoactive BS, dressing in place. No peritonitis.  Lab Results:   Recent Labs  12/19/15 0528 12/20/15 0541  WBC 7.1 8.5  HGB 10.6* 10.1*  HCT 33.9* 32.1*  PLT 381 366   BMET  Recent Labs  12/19/15 0528 12/20/15 0541  NA 137 136  K 4.1 3.6  CL 104 106  CO2 26 23  GLUCOSE 96 86  BUN 13 9  CREATININE 0.88 0.76  CALCIUM 9.3 8.7*   PT/INR No results for input(s): LABPROT, INR in the last 72 hours. CMP     Component Value Date/Time   NA 136 12/20/2015 0541   K 3.6 12/20/2015 0541   CL 106 12/20/2015 0541   CO2 23 12/20/2015 0541   GLUCOSE 86 12/20/2015 0541   BUN 9 12/20/2015 0541   CREATININE 0.76 12/20/2015 0541   CALCIUM 8.7 (L) 12/20/2015 0541   PROT 7.1 12/18/2015 1510   ALBUMIN 2.7 (L) 12/18/2015 1510   AST 27 12/18/2015 1510   ALT 11 (L) 12/18/2015 1510   ALKPHOS 74 12/18/2015 1510   BILITOT 0.5 12/18/2015 1510   GFRNONAA >60 12/20/2015 0541   GFRAA >60 12/20/2015 0541   Lipase      Component Value Date/Time   LIPASE 12 (L) 08/06/2014 2020       Studies/Results: Dg Abd Portable 1v  Result Date: 12/20/2015 CLINICAL DATA:  Small bowel obstruction history of previous perforated gastric ulcer in 2016. EXAM: PORTABLE ABDOMEN - 1 VIEW COMPARISON:  Portable supine abdominal radiographs of December 19, 2015 FINDINGS: There remain loops of moderately distended gas-filled small bowel in the mid abdomen. A few loops of normal caliber bowel are noted as well. There is some stool and gas in the colon and rectum. No free extraluminal gas collections are observed. There are degenerative changes of the lumbar spine with moderate levocurvature centered at L1. Numerous coils from previous hernia repair are present. IMPRESSION: Bowel gas pattern consistent with a clinically known partial mid to distal small bowel obstruction. No evidence of perforation currently. Electronically Signed   By: David  Martinique M.D.   On: 12/20/2015 07:46   Dg Abd Portable 1v  Result Date: 12/19/2015 CLINICAL DATA:  82 year old female with history of small-bowel obstruction. EXAM: PORTABLE ABDOMEN - 1 VIEW COMPARISON:  Abdominal radiograph 08/06/2014. FINDINGS: Gas and stool are seen scattered throughout the colon extending to the level of the distal rectum. There are a few mildly dilated loops of small bowel, most evident in the left lower quadrant of the abdomen measuring up to  3.6 cm in diameter. No gross evidence of pneumoperitoneum. Numerous soft tissue anchors are seen projecting over the abdomen, presumably from prior mesh repair for ventral hernia. Iodinated contrast material within the lumen of the urinary bladder related to recent CT examination. Dextroscoliosis of the lumbar spine convex to the right at the level of L2. Severe multilevel degenerative disc disease in the thoracolumbar spine. IMPRESSION: 1. Findings again suggest partial small bowel obstruction. 2. No pneumoperitoneum. Electronically Signed   By:  Vinnie Langton M.D.   On: 12/19/2015 08:40   Dg Addison Bailey G Tube Plc W/fl W/rad  Result Date: 12/19/2015 CLINICAL DATA:  82 year old female with small bowel obstruction in need of nasogastric tube. EXAM: NASO G TUBE PLACEMENT WITH FL AND WITH RAD CONTRAST:  None. FLUOROSCOPY TIME:  Fluoroscopy Time:  6 minutes and 42 seconds COMPARISON:  None. FINDINGS: Despite multiple attempts to fluoroscopically place a nasogastric tube into the patient's stomach, significant resistance was met in the region of the distal third of the esophagus at every attempt, with inability to pass the nasogastric tube beyond the area of apparent physical obstruction. During no time was the tube even able to extend into the intrathoracic portion of the patient's large hiatal hernia. Accordingly, the tube was removed from the patient, and the examination was terminated. IMPRESSION: 1. Unsuccessful attempt to fluoroscopically place a nasogastric tube into the stomach. Based upon review of the recent CT the abdomen and pelvis, this is presumably related to the patient's complex anatomy. The patient has a very large hiatal hernia, within acute tortuosity of the esophagus as it leads toward the hernia. Given the inability to pass the tube, the possibility of a proximal obstructing mass should be considered, and further evaluation with endoscopy, contrast enhanced chest CT, and/or barium swallow is recommended to better evaluate these findings. Electronically Signed   By: Vinnie Langton M.D.   On: 12/19/2015 15:12    Anti-infectives: Anti-infectives    Start     Dose/Rate Route Frequency Ordered Stop   12/20/15 1000  ciprofloxacin (CIPRO) IVPB 400 mg     400 mg 200 mL/hr over 60 Minutes Intravenous 2 times daily 12/20/15 0907     12/20/15 0915  metroNIDAZOLE (FLAGYL) IVPB 500 mg     500 mg 100 mL/hr over 60 Minutes Intravenous Every 8 hours 12/20/15 0907     12/18/15 0430  ciprofloxacin (CIPRO) IVPB 400 mg  Status:  Discontinued      400 mg 200 mL/hr over 60 Minutes Intravenous 2 times daily 12/18/15 0427 12/20/15 0904   12/18/15 0430  metroNIDAZOLE (FLAGYL) IVPB 500 mg  Status:  Discontinued     500 mg 100 mL/hr over 60 Minutes Intravenous Every 8 hours 12/18/15 0427 12/20/15 0904   12/18/15 0345  piperacillin-tazobactam (ZOSYN) IVPB 3.375 g  Status:  Discontinued     3.375 g 100 mL/hr over 30 Minutes Intravenous  Once 12/18/15 0330 12/18/15 0427       Assessment/Plan S/P laparoscopic incisional hernia repair with Goretex mesh by Dr. Zella Richer about 20y ago - Dr. Excell Seltzer has been following her in the office and has periodically debrided some of the Gore-Tex mesh from her wound - continue dressing changes S/p repair perforated gastric ulcer 07/2014 Dr. Excell Seltzer SBO - XR this AM suggests persistent, partial mid to distal small bowel obstruction - NG placement unsuccessful in IR - no n/v yesterday - no flatus ?Diverticulitis - Augmentin stopped yesterday, switched to cipro/flagyl yesterday per internal medicine  Hyponatremia - 136  today Chronic pain GERD - IV famotidine for now, likely transition back to PPI when no longer NPO Hiatal hernia  ID - cipro/flagyl FEN - NPO, IVF VTE - lovenox  Plan - pending barium swallow study. Patient drank gastrograffin for barium swallow, will consider repeating abdominal film 8 hours later (5pm) to look for contrast in colon.   LOS: 2 days    Jerrye Beavers , [facility] 12/20/2015, 9:01 AM Pager: [phone] Consults: [phone] Mon-Fri 7:00 am-4:30 pm Sat-Sun 7:00 am-11:30 am

## 2015-12-21 ENCOUNTER — Inpatient Hospital Stay (HOSPITAL_COMMUNITY): Payer: PPO | Admitting: Anesthesiology

## 2015-12-21 ENCOUNTER — Inpatient Hospital Stay (HOSPITAL_COMMUNITY): Payer: PPO

## 2015-12-21 ENCOUNTER — Encounter (HOSPITAL_COMMUNITY): Payer: Self-pay | Admitting: Certified Registered"

## 2015-12-21 ENCOUNTER — Encounter (HOSPITAL_COMMUNITY)
Admission: EM | Disposition: A | Discharge status: Skilled Nursing Facility | Payer: Self-pay | Source: Home / Self Care | Attending: Internal Medicine

## 2015-12-21 HISTORY — PX: LAPAROTOMY: SHX154

## 2015-12-21 SURGERY — LAPAROTOMY, EXPLORATORY
Anesthesia: General | Site: Abdomen

## 2015-12-21 MED ORDER — LACTATED RINGERS IV SOLN
INTRAVENOUS | Status: DC
  Administered 2015-12-21: 10:00:00 via INTRAVENOUS

## 2015-12-21 MED ORDER — SUCCINYLCHOLINE CHLORIDE 200 MG/10ML IV SOSY
PREFILLED_SYRINGE | INTRAVENOUS | Status: DC | PRN
  Administered 2015-12-21: 100 mg via INTRAVENOUS

## 2015-12-21 MED ORDER — ONDANSETRON HCL 4 MG/2ML IJ SOLN
INTRAMUSCULAR | Status: DC | PRN
  Administered 2015-12-21: 4 mg via INTRAVENOUS

## 2015-12-21 MED ORDER — PROPOFOL 10 MG/ML IV BOLUS
INTRAVENOUS | Status: AC
  Filled 2015-12-21: qty 20

## 2015-12-21 MED ORDER — SODIUM CHLORIDE 0.9 % IV SOLN
INTRAVENOUS | Status: DC
  Administered 2015-12-21: 125 mL/h via INTRAVENOUS
  Administered 2015-12-22: 10:00:00 via INTRAVENOUS

## 2015-12-21 MED ORDER — ONDANSETRON HCL 4 MG/2ML IJ SOLN: 4.0000 mg | Freq: Four times a day (QID) | INTRAMUSCULAR | Status: DC | PRN

## 2015-12-21 MED ORDER — SUFENTANIL CITRATE 50 MCG/ML IV SOLN
INTRAVENOUS | Status: DC | PRN
  Administered 2015-12-21 (×5): 10 ug via INTRAVENOUS

## 2015-12-21 MED ORDER — MIDAZOLAM HCL 5 MG/5ML IJ SOLN
INTRAMUSCULAR | Status: DC | PRN
  Administered 2015-12-21 (×2): 1 mg via INTRAVENOUS

## 2015-12-21 MED ORDER — PROMETHAZINE HCL 25 MG/ML IJ SOLN
INTRAMUSCULAR | Status: AC
  Administered 2015-12-21: 6.25 mg via INTRAVENOUS
  Filled 2015-12-21: qty 1

## 2015-12-21 MED ORDER — PROMETHAZINE HCL 25 MG/ML IJ SOLN
6.2500 mg | INTRAMUSCULAR | Status: DC | PRN
  Administered 2015-12-21: 6.25 mg via INTRAVENOUS

## 2015-12-21 MED ORDER — MORPHINE SULFATE 2 MG/ML IV SOLN
INTRAVENOUS | Status: DC
  Administered 2015-12-21: 4.5 mg via INTRAVENOUS
  Administered 2015-12-21: 13:00:00 via INTRAVENOUS
  Administered 2015-12-21: 19 mg via INTRAVENOUS
  Administered 2015-12-21 – 2015-12-22 (×2): 12 mg via INTRAVENOUS
  Administered 2015-12-22: 9 mg via INTRAVENOUS
  Administered 2015-12-22: 15 mg via INTRAVENOUS
  Filled 2015-12-21: qty 25

## 2015-12-21 MED ORDER — SODIUM CHLORIDE 0.9% FLUSH: 9.0000 mL | INTRAVENOUS | Status: DC | PRN

## 2015-12-21 MED ORDER — SUFENTANIL CITRATE 50 MCG/ML IV SOLN
INTRAVENOUS | Status: AC
  Filled 2015-12-21: qty 1

## 2015-12-21 MED ORDER — DIPHENHYDRAMINE HCL 12.5 MG/5ML PO ELIX: 12.5000 mg | ORAL_SOLUTION | Freq: Four times a day (QID) | ORAL | Status: DC | PRN

## 2015-12-21 MED ORDER — NALOXONE HCL 0.4 MG/ML IJ SOLN: 0.4000 mg | INTRAMUSCULAR | Status: DC | PRN

## 2015-12-21 MED ORDER — LORAZEPAM 2 MG/ML IJ SOLN
1.0000 mg | Freq: Four times a day (QID) | INTRAMUSCULAR | Status: DC | PRN
  Administered 2015-12-21 – 2015-12-24 (×4): 1 mg via INTRAVENOUS
  Filled 2015-12-21 (×5): qty 1

## 2015-12-21 MED ORDER — HYDROMORPHONE HCL 1 MG/ML IJ SOLN
0.2500 mg | INTRAMUSCULAR | Status: DC | PRN
  Administered 2015-12-21: 0.25 mg via INTRAVENOUS

## 2015-12-21 MED ORDER — ENOXAPARIN SODIUM 40 MG/0.4ML ~~LOC~~ SOLN
40.0000 mg | SUBCUTANEOUS | Status: DC
  Administered 2015-12-22 – 2015-12-29 (×8): 40 mg via SUBCUTANEOUS
  Filled 2015-12-21 (×8): qty 0.4

## 2015-12-21 MED ORDER — SUGAMMADEX SODIUM 200 MG/2ML IV SOLN
INTRAVENOUS | Status: DC | PRN
  Administered 2015-12-21: 145.2 mg via INTRAVENOUS

## 2015-12-21 MED ORDER — HYDROMORPHONE HCL 1 MG/ML IJ SOLN
INTRAMUSCULAR | Status: AC
  Administered 2015-12-21: 0.25 mg via INTRAVENOUS
  Filled 2015-12-21: qty 1

## 2015-12-21 MED ORDER — 0.9 % SODIUM CHLORIDE (POUR BTL) OPTIME
TOPICAL | Status: DC | PRN
  Administered 2015-12-21 (×3): 1000 mL

## 2015-12-21 MED ORDER — LIDOCAINE 2% (20 MG/ML) 5 ML SYRINGE
INTRAMUSCULAR | Status: AC
  Filled 2015-12-21: qty 5

## 2015-12-21 MED ORDER — LIDOCAINE 2% (20 MG/ML) 5 ML SYRINGE
INTRAMUSCULAR | Status: DC | PRN
  Administered 2015-12-21: 60 mg via INTRAVENOUS

## 2015-12-21 MED ORDER — DIPHENHYDRAMINE HCL 50 MG/ML IJ SOLN: 12.5000 mg | Freq: Four times a day (QID) | INTRAMUSCULAR | Status: DC | PRN

## 2015-12-21 MED ORDER — PROPOFOL 10 MG/ML IV BOLUS
INTRAVENOUS | Status: DC | PRN
  Administered 2015-12-21: 100 mg via INTRAVENOUS

## 2015-12-21 MED ORDER — MIDAZOLAM HCL 2 MG/2ML IJ SOLN
INTRAMUSCULAR | Status: AC
  Filled 2015-12-21: qty 2

## 2015-12-21 MED ORDER — MORPHINE SULFATE 2 MG/ML IV SOLN
INTRAVENOUS | Status: AC
  Filled 2015-12-21: qty 25

## 2015-12-21 MED ORDER — ROCURONIUM BROMIDE 10 MG/ML (PF) SYRINGE
PREFILLED_SYRINGE | INTRAVENOUS | Status: DC | PRN
  Administered 2015-12-21: 30 mg via INTRAVENOUS
  Administered 2015-12-21: 10 mg via INTRAVENOUS

## 2015-12-21 MED ORDER — ROCURONIUM BROMIDE 10 MG/ML (PF) SYRINGE
PREFILLED_SYRINGE | INTRAVENOUS | Status: AC
  Filled 2015-12-21: qty 10

## 2015-12-21 SURGICAL SUPPLY — 55 items
BLADE SURG ROTATE 9660 (MISCELLANEOUS) IMPLANT
CANISTER SUCTION 2500CC (MISCELLANEOUS) ×3 IMPLANT
COVER SURGICAL LIGHT HANDLE (MISCELLANEOUS) ×3 IMPLANT
DRAIN CHANNEL 19F RND (DRAIN) ×3 IMPLANT
DRAPE LAPAROSCOPIC ABDOMINAL (DRAPES) ×3 IMPLANT
DRAPE WARM FLUID 44X44 (DRAPE) ×3 IMPLANT
DRSG OPSITE POSTOP 4X10 (GAUZE/BANDAGES/DRESSINGS) IMPLANT
DRSG OPSITE POSTOP 4X8 (GAUZE/BANDAGES/DRESSINGS) IMPLANT
DRSG PAD ABDOMINAL 8X10 ST (GAUZE/BANDAGES/DRESSINGS) ×3 IMPLANT
ELECT BLADE 6.5 EXT (BLADE) IMPLANT
ELECT CAUTERY BLADE 6.4 (BLADE) ×3 IMPLANT
ELECT REM PT RETURN 9FT ADLT (ELECTROSURGICAL) ×3
ELECTRODE REM PT RTRN 9FT ADLT (ELECTROSURGICAL) ×1 IMPLANT
EVACUATOR SILICONE 100CC (DRAIN) ×3 IMPLANT
GLOVE BIO SURGEON STRL SZ7.5 (GLOVE) ×3 IMPLANT
GLOVE BIO SURGEON STRL SZ8 (GLOVE) ×6 IMPLANT
GLOVE BIOGEL PI IND STRL 6.5 (GLOVE) ×2 IMPLANT
GLOVE BIOGEL PI IND STRL 7.5 (GLOVE) ×2 IMPLANT
GLOVE BIOGEL PI IND STRL 8 (GLOVE) ×1 IMPLANT
GLOVE BIOGEL PI INDICATOR 6.5 (GLOVE) ×4
GLOVE BIOGEL PI INDICATOR 7.5 (GLOVE) ×4
GLOVE BIOGEL PI INDICATOR 8 (GLOVE) ×2
GLOVE SURG SIGNA 7.5 PF LTX (GLOVE) ×6 IMPLANT
GLOVE SURG SS PI 6.5 STRL IVOR (GLOVE) ×6 IMPLANT
GLOVE SURG SS PI 7.0 STRL IVOR (GLOVE) ×3 IMPLANT
GOWN STRL REUS W/ TWL LRG LVL3 (GOWN DISPOSABLE) ×3 IMPLANT
GOWN STRL REUS W/ TWL XL LVL3 (GOWN DISPOSABLE) ×2 IMPLANT
GOWN STRL REUS W/TWL LRG LVL3 (GOWN DISPOSABLE) ×9
GOWN STRL REUS W/TWL XL LVL3 (GOWN DISPOSABLE) ×6
KIT BASIN OR (CUSTOM PROCEDURE TRAY) ×3 IMPLANT
KIT ROOM TURNOVER OR (KITS) ×3 IMPLANT
LIGASURE IMPACT 36 18CM CVD LR (INSTRUMENTS) IMPLANT
MESH PHASIX ST 20CMX25CM (Tissue Mesh) ×3 IMPLANT
NS IRRIG 1000ML POUR BTL (IV SOLUTION) ×6 IMPLANT
PACK GENERAL/GYN (CUSTOM PROCEDURE TRAY) ×3 IMPLANT
PAD ABD 8X10 STRL (GAUZE/BANDAGES/DRESSINGS) ×3 IMPLANT
PAD ARMBOARD 7.5X6 YLW CONV (MISCELLANEOUS) ×3 IMPLANT
SPECIMEN JAR LARGE (MISCELLANEOUS) IMPLANT
SPONGE GAUZE 4X4 12PLY STER LF (GAUZE/BANDAGES/DRESSINGS) ×3 IMPLANT
SPONGE LAP 18X18 X RAY DECT (DISPOSABLE) IMPLANT
STAPLER VISISTAT 35W (STAPLE) ×3 IMPLANT
SUCTION POOLE TIP (SUCTIONS) ×3 IMPLANT
SUT ETHILON 2 0 FS 18 (SUTURE) ×3 IMPLANT
SUT NOVA NAB DX-16 0-1 5-0 T12 (SUTURE) ×9 IMPLANT
SUT PDS AB 1 TP1 96 (SUTURE) ×6 IMPLANT
SUT SILK 2 0 SH CR/8 (SUTURE) ×3 IMPLANT
SUT SILK 2 0 TIES 10X30 (SUTURE) ×3 IMPLANT
SUT SILK 3 0 SH CR/8 (SUTURE) ×3 IMPLANT
SUT SILK 3 0 TIES 10X30 (SUTURE) ×3 IMPLANT
SUT VIC AB 3-0 SH 18 (SUTURE) IMPLANT
TOWEL OR 17X24 6PK STRL BLUE (TOWEL DISPOSABLE) ×3 IMPLANT
TOWEL OR 17X26 10 PK STRL BLUE (TOWEL DISPOSABLE) ×3 IMPLANT
TRAY FOLEY CATH 16FRSI W/METER (SET/KITS/TRAYS/PACK) IMPLANT
TRAY FOLEY W/METER SILVER 16FR (SET/KITS/TRAYS/PACK) ×3 IMPLANT
YANKAUER SUCT BULB TIP NO VENT (SUCTIONS) ×3 IMPLANT

## 2015-12-21 NOTE — Progress Notes (Signed)
Pt. Refused AM lab draws.   Jimmie Molly, RN

## 2015-12-21 NOTE — Anesthesia Postprocedure Evaluation (Signed)
Anesthesia Post Note  Patient: Monique Russo  Procedure(s) Performed: Procedure(s) (LRB): EXPLORATORY LAPAROTOMY/ REMOVAL OF MESH (N/A)  Patient location during evaluation: PACU Anesthesia Type: General Level of consciousness: awake and alert Pain management: pain level controlled Vital Signs Assessment: post-procedure vital signs reviewed and stable Respiratory status: spontaneous breathing, nonlabored ventilation, respiratory function stable and patient connected to nasal cannula oxygen Cardiovascular status: blood pressure returned to baseline and stable Postop Assessment: no signs of nausea or vomiting Anesthetic complications: no    Last Vitals:  Vitals:   12/21/15 1239 12/21/15 1254  BP: 138/76   Pulse: 92   Resp: (!) 27 15  Temp:      Last Pain:  Vitals:   12/21/15 1254  TempSrc:   PainSc: 10-Worst pain ever                 Nilesh Stegall S

## 2015-12-21 NOTE — Progress Notes (Signed)
[facility]                                                                              Patient Demographics  Monique Russo, is a 82 y.o. female, DOB - 14-Sep-1930, [id number]  Admit date - 12/17/2015   Admitting Physician Edwin Dada, MD  Outpatient Primary MD for the patient is Horatio Pel, MD  Outpatient specialists:   LOS - 3  days    Chief Complaint  Patient presents with  . Abdominal Pain       Brief summary   Monique Russo is a 82 y.o. female with a past medical history significant for chronic pain on methadone and chronic non-healing surgical wound who presented with abdominal pain and vomiting for 3 days. The patient had a perforated ulcer 1 year ago requiring [name] patch. This surgery was unfortunately complicated by nonhealing surgical wound (because of 82 year old infected hernia repair mesh?), Which for the last year the patient has been having wound care by her general surgeon in the office every six weeks (per family). This week, two days prior to admission, the patient had an office debridement and described that Dr. Excell Seltzer did his usual debridement, after which she began to develop nausea and LLQ abdominal pain.  This crampy, severe, LLQ aching abdominal pain continued intermittently and was associated with several episodes of nonbloody emesis and obstipation. On the morning of admission, she was recommended by surgery office for ER evaluation. CT of the abdomen and pelvis was obtained that showed small bowel obstruction with transition point in the right mid abdomen and stranding around the colon, consistent with diverticulitis. Surgery was consulted and patient was admitted for further workup.   Assessment & Plan    Principal Problem: Small bowel obstruction - Per patient, had a BM yesterday however abdominal x-ray shows persistent high-grade small bowel obstruction -Continue  NPO status, IV fluids and GT could not be  placed despite multiple attempts, even under fluoroscopy.   - Continue pain control and antiemetics - Surgery following, recommending exploratory laparotomy today   Diverticulitis:  There is stranding around the colon that suggests diverticulitis. -Placed on Ciprofloxacin and Flagyl IV    Hyponatremia:  -Improving, continue  IV fluid hydration   Chronic pain syndrome:  -Hold methadone and hydrocodone while NPO   GERD and hiatal hernia: -Replace PPI with IV famotidine while NPO  - DG barium esophagus showed marked lower esophageal tortuosity with large hiatal hernia but no mass. For definitive imaging, patient will need EGD.   Code Status: full DVT Prophylaxis: SCD's Family Communication: Discussed in detail with the patient, all imaging results, lab results explained to the patient and patient's daughter at the bedside   Disposition Plan: Time Spent in minutes  25 minutes  Procedures:  CT abd Multiple abdominal x-rays   Consultants:   Surgery- Dr Grandville Silos  Antimicrobials:   IV ciprofloxacin 9/30  IV Flagyl 9/30   Medications  Scheduled Meds: . [MAR Hold] ciprofloxacin  400 mg Intravenous BID  . [MAR Hold] diatrizoate meglumine-sodium  90 mL Oral Once  . [MAR Hold] enoxaparin (LOVENOX) injection  40 mg Subcutaneous  Q24H  . [MAR Hold] famotidine (PEPCID) IV  20 mg Intravenous Q24H  . [MAR Hold] metronidazole  500 mg Intravenous Q8H  . [MAR Hold] timolol  1 drop Both Eyes QHS  . [MAR Hold] timolol  2 drop Both Eyes Daily   Continuous Infusions: . sodium chloride 100 mL/hr at 12/20/15 0936  . lactated ringers 50 mL/hr at 12/21/15 1010   PRN Meds:.0.9 % irrigation (POUR BTL), [MAR Hold]  HYDROmorphone (DILAUDID) injection, [MAR Hold] LORazepam, [MAR Hold] ondansetron (ZOFRAN) IV   Antibiotics   Anti-infectives    Start     Dose/Rate Route Frequency Ordered Stop   12/20/15 1000  [MAR Hold]  ciprofloxacin (CIPRO) IVPB 400 mg     (MAR Hold since 12/21/15  0947)   400 mg 200 mL/hr over 60 Minutes Intravenous 2 times daily 12/20/15 0907     12/20/15 0945  [MAR Hold]  metroNIDAZOLE (FLAGYL) IVPB 500 mg     (MAR Hold since 12/21/15 0947)   500 mg 100 mL/hr over 60 Minutes Intravenous Every 8 hours 12/20/15 0907     12/18/15 0430  ciprofloxacin (CIPRO) IVPB 400 mg  Status:  Discontinued     400 mg 200 mL/hr over 60 Minutes Intravenous 2 times daily 12/18/15 0427 12/20/15 0904   12/18/15 0430  metroNIDAZOLE (FLAGYL) IVPB 500 mg  Status:  Discontinued     500 mg 100 mL/hr over 60 Minutes Intravenous Every 8 hours 12/18/15 0427 12/20/15 0904   12/18/15 0345  piperacillin-tazobactam (ZOSYN) IVPB 3.375 g  Status:  Discontinued     3.375 g 100 mL/hr over 30 Minutes Intravenous  Once 12/18/15 0330 12/18/15 2330        Subjective:   Monique Russo was seen and examined today. Alert and awake, complains of diffuse abdominal pain. No fevers. Daughter at the bedside.  No chest pain or shortness of breath, nausea, vomiting, weakness, numbness or tingling.  Objective:   Vitals:   12/20/15 1335 12/20/15 2216 12/21/15 0525 12/21/15 0921  BP: 140/76 (!) 142/67 132/70 126/60  Pulse: 93 90 92 93  Resp:  '16 16 18  ' Temp: 98.8 F (37.1 C) 98.5 F (36.9 C) 99.5 F (37.5 C) 98.2 F (36.8 C)  TempSrc: Oral Oral  Oral  SpO2: 95% 93% 92% 92%  Weight:      Height:        Intake/Output Summary (Last 24 hours) at 12/21/15 1109 Last data filed at 12/21/15 0525  Gross per 24 hour  Intake          2456.67 ml  Output             2250 ml  Net           206.67 ml     Wt Readings from Last 3 Encounters:  12/18/15 72.6 kg (160 lb)  11/26/14 83.1 kg (183 lb 1.6 oz)  08/06/14 89.4 kg (197 lb 1.5 oz)     Exam  General: Alert and oriented 3,Uncomfortable with pain  HEENT:    Neck: Supple, no JVD  Cardiovascular: S1 S2 clear, RRR  Respiratory: CTAB  Gastrointestinal: Soft, dressing intact, TTP diffusely, hypoactive bowel sounds  Ext: no  cyanosis clubbing, + edema  Neuro: no focal neurological deficits  Skin: No rashes  Psych: alert and oriented, normal mood and affect   Data Reviewed:  I have personally reviewed following labs and imaging studies  Micro Results Recent Results (from the past 240 hour(s))  Urine culture  Status: None   Collection Time: 12/17/15  3:07 PM  Result Value Ref Range Status   Specimen Description URINE, RANDOM  Final   Special Requests NONE  Final   Culture NO GROWTH  Final   Report Status 12/19/2015 FINAL  Final  Culture, blood (Routine x 2)     Status: None (Preliminary result)   Collection Time: 12/17/15 11:55 PM  Result Value Ref Range Status   Specimen Description BLOOD RIGHT HAND  Final   Special Requests IN PEDIATRIC BOTTLE 1ML  Final   Culture NO GROWTH 2 DAYS  Final   Report Status PENDING  Incomplete  Culture, blood (Routine x 2)     Status: None (Preliminary result)   Collection Time: 12/17/15 11:59 PM  Result Value Ref Range Status   Specimen Description BLOOD RIGHT ARM  Final   Special Requests BOTTLES DRAWN AEROBIC AND ANAEROBIC 5ML  Final   Culture NO GROWTH 2 DAYS  Final   Report Status PENDING  Incomplete  MRSA PCR Screening     Status: None   Collection Time: 12/18/15  2:03 PM  Result Value Ref Range Status   MRSA by PCR NEGATIVE NEGATIVE Final    Comment:        The GeneXpert MRSA Assay (FDA approved for NASAL specimens only), is one component of a comprehensive MRSA colonization surveillance program. It is not intended to diagnose MRSA infection nor to guide or monitor treatment for MRSA infections.     Radiology Reports Dg Chest 2 View  Result Date: 12/18/2015 CLINICAL DATA:  82 y/o F; abdominal surgery last week, suspected sepsis. EXAM: CHEST  2 VIEW COMPARISON:  08/05/2014 chest radiograph. FINDINGS: Right mid and lower lung zone opacity is consistent with a large hiatal hernia seen on prior CT. Linear and patchy opacities of the lungs  bilaterally, predominantly perihilar. Underlying pneumonia is not excluded. No pneumothorax or pleural effusion. Bilateral reverse total shoulder replacements and degenerative changes of the spine. Stable cardiac silhouette. IMPRESSION: Large right-sided hiatal hernia. Linear and patchy lung opacities probably represent mild edema and/or atelectasis. Pneumonia is not excluded. Electronically Signed   By: Kristine Garbe M.D.   On: 12/18/2015 01:28   Ct Abdomen Pelvis W Contrast  Result Date: 12/18/2015 CLINICAL DATA:  Abdominal pain and vomiting. Perforated ulcer 1 year ago with subsequent surgery. Problems since then. EXAM: CT ABDOMEN AND PELVIS WITH CONTRAST TECHNIQUE: Multidetector CT imaging of the abdomen and pelvis was performed using the standard protocol following bolus administration of intravenous contrast. CONTRAST:  127m ISOVUE-300 IOPAMIDOL (ISOVUE-300) INJECTION 61% COMPARISON:  10/27/2015 FINDINGS: Lower chest: Atelectasis in the lung bases. Hepatobiliary: Gallbladder and bile ducts are mildly dilated. No stones or obstructing lesion appreciated. Pancreas: Pancreas is fatty replaced. Spleen: Normal in size without focal abnormality. Adrenals/Urinary Tract: No adrenal gland nodules. Atrophy and scarring of the kidneys. No hydronephrosis. Central calcification in the right renal hilum may represent a distal renal artery aneurysm. Cyst on the left kidney. No bladder wall thickening bladder stones. No adrenal gland nodules. Stomach/Bowel: Large esophageal hiatal hernia with distention of the stomach and distal esophagus. Small bowel are distended and predominantly fluid-filled throughout. Distal terminal ileum is decompressed. There appears to partial resection of the colon with and ileal colonic anastomosis. Changes suggest small bowel obstruction at the level of the distal ileum, transition zone appears to be in the right lower mid abdomen. Diverticulosis of the sigmoid colon. Mild  stranding around the sigmoid colon could also represent diverticulitis. No  abscess. Vascular/Lymphatic: Mild calcification and torsion of the aorta. No significant lymphadenopathy. Reproductive: Status post hysterectomy. No adnexal masses. Other: Postoperative changes in the anterior abdominal wall consistent with mesh hernia repair. There appears to be and open incision or scar centrally. No abscess. Small left inguinal hernia containing fat. No free air or free fluid in the abdomen. Musculoskeletal: Degenerative changes and scoliosis of the lumbar spine. IMPRESSION: Diffusely dilated fluid-filled small bowel with transition zone in the distal ileum consistent with small bowel obstruction. Stomach and esophagus are also distended with large esophageal hiatal hernia. Ileocolonic anastomosis. Small left inguinal hernia containing fat. Diverticulosis of the sigmoid colon with stranding in the pelvic fat possibly indicating superimposed diverticulitis. No abscess Electronically Signed   By: Lucienne Capers M.D.   On: 12/18/2015 03:01   Dg Esophagus  Result Date: 12/20/2015 CLINICAL DATA:  Difficulty passing nasogastric tube yesterday. Question esophageal mass. EXAM: ESOPHOGRAM/BARIUM SWALLOW TECHNIQUE: Single contrast examination was performed using  water-soluble. FLUOROSCOPY TIME:  Fluoroscopy Time:  0 minutes 51 seconds Radiation Exposure Index (if provided by the fluoroscopic device): Number of Acquired Spot Images: 3 COMPARISON:  CT abdomen pelvis 12/18/2015. FINDINGS: A limited examination was performed in the supine position. Patient drank water soluble contrast. The lower esophagus is markedly tortuous. Large hiatal hernia. Assessment for a mass or stricture is limited by suboptimal distension and opacification of the esophagus and hiatal hernia. IMPRESSION: 1. Difficulty passing nasogastric tube likely due to marked lower esophageal tortuosity. 2. Difficult to assess for a mass due to limited  opacification and distention of the esophagus. 3. Large hiatal hernia. Electronically Signed   By: Lorin Picket M.D.   On: 12/20/2015 09:27   Dg Abd Portable 1v  Result Date: 12/21/2015 CLINICAL DATA:  Small-bowel obstruction. EXAM: PORTABLE ABDOMEN - 1 VIEW COMPARISON:  12/20/2015.  12/19/2015.  CT 12/18/2015. FINDINGS: Prior hernia repair. Persistent distended loops of small bowel noted with slight progression from prior exam. Oral contrast noted in colon. These findings are again consistent with partial small bowel obstruction. No free air. Degenerative changes scoliosis lumbar spine. IMPRESSION: Persistent distended loops of small bowel noted with slight progression from prior exam. Oral contrast in the colon. These findings are consistent with persistent partial small bowel obstruction. Electronically Signed   By: Marcello Moores  Register   On: 12/21/2015 07:37   Dg Abd Portable 1v  Result Date: 12/20/2015 CLINICAL DATA:  Small bowel obstruction history of previous perforated gastric ulcer in 2016. EXAM: PORTABLE ABDOMEN - 1 VIEW COMPARISON:  Portable supine abdominal radiographs of December 19, 2015 FINDINGS: There remain loops of moderately distended gas-filled small bowel in the mid abdomen. A few loops of normal caliber bowel are noted as well. There is some stool and gas in the colon and rectum. No free extraluminal gas collections are observed. There are degenerative changes of the lumbar spine with moderate levocurvature centered at L1. Numerous coils from previous hernia repair are present. IMPRESSION: Bowel gas pattern consistent with a clinically known partial mid to distal small bowel obstruction. No evidence of perforation currently. Electronically Signed   By: David  Martinique M.D.   On: 12/20/2015 07:46   Dg Abd Portable 1v  Result Date: 12/19/2015 CLINICAL DATA:  82 year old female with history of small-bowel obstruction. EXAM: PORTABLE ABDOMEN - 1 VIEW COMPARISON:  Abdominal radiograph  08/06/2014. FINDINGS: Gas and stool are seen scattered throughout the colon extending to the level of the distal rectum. There are a few mildly dilated loops of small bowel,  most evident in the left lower quadrant of the abdomen measuring up to 3.6 cm in diameter. No gross evidence of pneumoperitoneum. Numerous soft tissue anchors are seen projecting over the abdomen, presumably from prior mesh repair for ventral hernia. Iodinated contrast material within the lumen of the urinary bladder related to recent CT examination. Dextroscoliosis of the lumbar spine convex to the right at the level of L2. Severe multilevel degenerative disc disease in the thoracolumbar spine. IMPRESSION: 1. Findings again suggest partial small bowel obstruction. 2. No pneumoperitoneum. Electronically Signed   By: Vinnie Langton M.D.   On: 12/19/2015 08:40   Dg Addison Bailey G Tube Plc W/fl W/rad  Result Date: 12/19/2015 CLINICAL DATA:  82 year old female with small bowel obstruction in need of nasogastric tube. EXAM: NASO G TUBE PLACEMENT WITH FL AND WITH RAD CONTRAST:  None. FLUOROSCOPY TIME:  Fluoroscopy Time:  6 minutes and 42 seconds COMPARISON:  None. FINDINGS: Despite multiple attempts to fluoroscopically place a nasogastric tube into the patient's stomach, significant resistance was met in the region of the distal third of the esophagus at every attempt, with inability to pass the nasogastric tube beyond the area of apparent physical obstruction. During no time was the tube even able to extend into the intrathoracic portion of the patient's large hiatal hernia. Accordingly, the tube was removed from the patient, and the examination was terminated. IMPRESSION: 1. Unsuccessful attempt to fluoroscopically place a nasogastric tube into the stomach. Based upon review of the recent CT the abdomen and pelvis, this is presumably related to the patient's complex anatomy. The patient has a very large hiatal hernia, within acute tortuosity of the  esophagus as it leads toward the hernia. Given the inability to pass the tube, the possibility of a proximal obstructing mass should be considered, and further evaluation with endoscopy, contrast enhanced chest CT, and/or barium swallow is recommended to better evaluate these findings. Electronically Signed   By: Vinnie Langton M.D.   On: 12/19/2015 15:12    Lab Data:  CBC:  Recent Labs Lab 12/17/15 2359 12/18/15 1510 12/19/15 0528 12/20/15 0541  WBC 17.1* 8.3 7.1 8.5  NEUTROABS 14.3*  --   --   --   HGB 13.9 12.6 10.6* 10.1*  HCT 41.2 38.7 33.9* 32.1*  MCV 93.8 96.3 96.0 96.4  PLT 506* 463* 381 446   Basic Metabolic Panel:  Recent Labs Lab 12/17/15 2359 12/18/15 0635 12/18/15 1510 12/19/15 0528 12/20/15 0541  NA 132* 130* 130* 137 136  K 4.6 4.5 4.2 4.1 3.6  CL 95* 96* 95* 104 106  CO2 '27 23 23 26 23  ' GLUCOSE 139* 131* 119* 96 86  BUN '11 12 15 13 9  ' CREATININE 1.00 0.87 1.19* 0.88 0.76  CALCIUM 10.5* 9.6 9.6 9.3 8.7*  MG  --  2.0  --   --   --    GFR: Estimated Creatinine Clearance: 47.4 mL/min (by C-G formula based on SCr of 0.76 mg/dL). Liver Function Tests:  Recent Labs Lab 12/17/15 2359 12/18/15 1510  AST 19 27  ALT 11* 11*  ALKPHOS 83 74  BILITOT 0.7 0.5  PROT 8.2* 7.1  ALBUMIN 2.9* 2.7*   No results for input(s): LIPASE, AMYLASE in the last 168 hours. No results for input(s): AMMONIA in the last 168 hours. Coagulation Profile: No results for input(s): INR, PROTIME in the last 168 hours. Cardiac Enzymes: No results for input(s): CKTOTAL, CKMB, CKMBINDEX, TROPONINI in the last 168 hours. BNP (last 3 results) No results  for input(s): PROBNP in the last 8760 hours. HbA1C: No results for input(s): HGBA1C in the last 72 hours. CBG: No results for input(s): GLUCAP in the last 168 hours. Lipid Profile: No results for input(s): CHOL, HDL, LDLCALC, TRIG, CHOLHDL, LDLDIRECT in the last 72 hours. Thyroid Function Tests: No results for input(s): TSH,  T4TOTAL, FREET4, T3FREE, THYROIDAB in the last 72 hours. Anemia Panel: No results for input(s): VITAMINB12, FOLATE, FERRITIN, TIBC, IRON, RETICCTPCT in the last 72 hours. Urine analysis:    Component Value Date/Time   COLORURINE AMBER (A) 12/18/2015 1507   APPEARANCEUR CLEAR 12/18/2015 1507   LABSPEC >1.046 (H) 12/18/2015 1507   PHURINE 5.5 12/18/2015 1507   GLUCOSEU NEGATIVE 12/18/2015 1507   HGBUR NEGATIVE 12/18/2015 1507   BILIRUBINUR SMALL (A) 12/18/2015 1507   KETONESUR NEGATIVE 12/18/2015 1507   PROTEINUR NEGATIVE 12/18/2015 1507   UROBILINOGEN 0.2 08/04/2014 2100   NITRITE NEGATIVE 12/18/2015 1507   LEUKOCYTESUR NEGATIVE 12/18/2015 1507     RAI,RIPUDEEP M.D. [facility] 12/21/2015, 11:09 AM  Pager: [phone] Between 7am to 7pm - call Pager - [phone]  After 7pm go to [email or web address] - password [email or web address]  Call night coverage person covering after 7pm

## 2015-12-21 NOTE — Progress Notes (Signed)
During shift change oncoming RN informed that patient's daughter had been pressing the PCA for the patient when she wakes up groaning in pain.  RN expressed to the daughter that per policy, the patient is the only person that is supposed to push the PCA. Per daughter ", I have been told what the policy is but I am still going to press it because she cannot do it." Sticky note left for MD about possibly changing the pain medication the patient is receiving to an IV push by nurse.

## 2015-12-21 NOTE — Anesthesia Procedure Notes (Signed)
Procedure Name: Intubation Date/Time: 12/21/2015 10:41 AM Performed by: Melina Copa, Allenmichael Mcpartlin R Pre-anesthesia Checklist: Patient identified, Emergency Drugs available, Suction available and Patient being monitored Patient Re-evaluated:Patient Re-evaluated prior to inductionOxygen Delivery Method: Circle System Utilized Preoxygenation: Pre-oxygenation with 100% oxygen Intubation Type: IV induction and Rapid sequence Laryngoscope Size: Mac and 3 Grade View: Grade I Tube type: Oral Tube size: 7.5 mm Number of attempts: 1 Airway Equipment and Method: Stylet and Oral airway Placement Confirmation: ETT inserted through vocal cords under direct vision,  positive ETCO2 and breath sounds checked- equal and bilateral Secured at: 21 cm Tube secured with: Tape Dental Injury: Teeth and Oropharynx as per pre-operative assessment

## 2015-12-21 NOTE — Progress Notes (Signed)
Went to place PICC line, consent explained and signed by patient daughter but when 2RN do time out, patient doesn't wanna be touch, in pain post surgery. Blanch Media RN to let primary RN to inform PICC nurse when patient is ready and fully awake.

## 2015-12-21 NOTE — Transfer of Care (Signed)
Immediate Anesthesia Transfer of Care Note  Patient: Monique Russo  Procedure(s) Performed: Procedure(s): EXPLORATORY LAPAROTOMY/ REMOVAL OF MESH (N/A)  Patient Location: PACU  Anesthesia Type:General  Level of Consciousness: awake and patient cooperative  Airway & Oxygen Therapy: Patient Spontanous Breathing and Patient connected to nasal cannula oxygen  Post-op Assessment: Report given to RN, Post -op Vital signs reviewed and stable and Patient moving all extremities  Post vital signs: Reviewed and stable  Last Vitals:  Vitals:   12/21/15 0921 12/21/15 1238  BP: 126/60   Pulse: 93   Resp: 18   Temp: 36.8 C 36.8 C    Last Pain:  Vitals:   12/21/15 0921  TempSrc: Oral  PainSc:       Patients Stated Pain Goal: 2 ([id number])  Complications: No apparent anesthesia complications

## 2015-12-21 NOTE — Op Note (Addendum)
EXPLORATORY LAPAROTOMY  Procedure Note  Monique Russo 12/17/2015 - 12/21/2015   Pre-op Diagnosis: Small Bowel Obstruction     Post-op Diagnosis: same  Procedure(s): EXPLORATORY LAPAROTOMY LYSIS OF ADHESIONS REMOVAL OF MESH COMPLEX ABDOMINAL WALL HERNIA CLOSURE WITH BIOLOGIC MESH (20 X 25 CM PHASIX)  Surgeon(s): Coralie Keens, MD Georganna Skeans, MD  Anesthesia: General  Staff:  Circulator: Christen Bame, RN Relief Circulator: Sharee Holster, RN Relief Scrub: Kara Mead, RN Scrub Person: Georgeanna Harrison  Estimated Blood Loss: Minimal                         [name]   Date: 12/21/2015  Time: 12:31 PM

## 2015-12-21 NOTE — Anesthesia Preprocedure Evaluation (Addendum)
Anesthesia Evaluation  Patient identified by MRN, date of birth, ID band Patient awake    Reviewed: Allergy & Precautions, NPO status , Patient's Chart, lab work & pertinent test results  Airway Mallampati: II  TM Distance: >3 FB Neck ROM: Full    Dental no notable dental hx.    Pulmonary neg pulmonary ROS,    Pulmonary exam normal breath sounds clear to auscultation       Cardiovascular +CHF  Normal cardiovascular exam Rhythm:Regular Rate:Normal     Neuro/Psych Patient on methadone negative neurological ROS  negative psych ROS   GI/Hepatic Neg liver ROS, GERD  Medicated,  Endo/Other  negative endocrine ROS  Renal/GU negative Renal ROS  negative genitourinary   Musculoskeletal negative musculoskeletal ROS (+)   Abdominal   Peds negative pediatric ROS (+)  Hematology negative hematology ROS (+)   Anesthesia Other Findings   Reproductive/Obstetrics negative OB ROS                             Anesthesia Physical Anesthesia Plan  ASA: III  Anesthesia Plan: General   Post-op Pain Management:    Induction: Intravenous, Rapid sequence and Cricoid pressure planned  Airway Management Planned: Oral ETT  Additional Equipment:   Intra-op Plan:   Post-operative Plan: Possible Post-op intubation/ventilation  Informed Consent: I have reviewed the patients History and Physical, chart, labs and discussed the procedure including the risks, benefits and alternatives for the proposed anesthesia with the patient or authorized representative who has indicated his/her understanding and acceptance.   Dental advisory given  Plan Discussed with: CRNA and Surgeon  Anesthesia Plan Comments:        Anesthesia Quick Evaluation

## 2015-12-21 NOTE — Progress Notes (Signed)
Report called to Short Stay 

## 2015-12-21 NOTE — Progress Notes (Signed)
Patient ID: Monique Russo, female   DOB: [birth year], 82 y.o.   MRN: [id number]  [facility] Surgery Progress Note     Subjective: Not feeling well this morning. Patient reports bloating, abdominal pain, and nausea. Had 2 BM's yesterday but no flatus since then.  Objective: Vital signs in last 24 hours: Temp:  [98.5 F (36.9 C)-98.8 F (37.1 C)] 98.5 F (36.9 C) (10/02 2216) Pulse Rate:  [90-93] 90 (10/02 2216) Resp:  [16] 16 (10/02 2216) BP: (140-142)/(67-76) 142/67 (10/02 2216) SpO2:  [93 %-95 %] 93 % (10/02 2216) Last BM Date: 12/20/15 (X's 2)  Intake/Output from previous day: 10/02 0701 - 10/03 0700 In: 2756.7 [I.V.:2106.7; IV Piggyback:650] Out: 1850 [Urine:1850] Intake/Output this shift: No intake/output data recorded.  PE: Gen: sleeping, NAD Abd: Soft, moderately distended, diffusely tender, present but hypoactive BS, dressing in place. No peritonitis.  Lab Results:   Recent Labs  12/19/15 0528 12/20/15 0541  WBC 7.1 8.5  HGB 10.6* 10.1*  HCT 33.9* 32.1*  PLT 381 366   BMET  Recent Labs  12/19/15 0528 12/20/15 0541  NA 137 136  K 4.1 3.6  CL 104 106  CO2 26 23  GLUCOSE 96 86  BUN 13 9  CREATININE 0.88 0.76  CALCIUM 9.3 8.7*   PT/INR No results for input(s): LABPROT, INR in the last 72 hours. CMP     Component Value Date/Time   NA 136 12/20/2015 0541   K 3.6 12/20/2015 0541   CL 106 12/20/2015 0541   CO2 23 12/20/2015 0541   GLUCOSE 86 12/20/2015 0541   BUN 9 12/20/2015 0541   CREATININE 0.76 12/20/2015 0541   CALCIUM 8.7 (L) 12/20/2015 0541   PROT 7.1 12/18/2015 1510   ALBUMIN 2.7 (L) 12/18/2015 1510   AST 27 12/18/2015 1510   ALT 11 (L) 12/18/2015 1510   ALKPHOS 74 12/18/2015 1510   BILITOT 0.5 12/18/2015 1510   GFRNONAA >60 12/20/2015 0541   GFRAA >60 12/20/2015 0541   Lipase     Component Value Date/Time   LIPASE 12 (L) 08/06/2014 2020       Studies/Results: Dg Esophagus  Result Date: 12/20/2015 CLINICAL  DATA:  Difficulty passing nasogastric tube yesterday. Question esophageal mass. EXAM: ESOPHOGRAM/BARIUM SWALLOW TECHNIQUE: Single contrast examination was performed using  water-soluble. FLUOROSCOPY TIME:  Fluoroscopy Time:  0 minutes 51 seconds Radiation Exposure Index (if provided by the fluoroscopic device): Number of Acquired Spot Images: 3 COMPARISON:  CT abdomen pelvis 12/18/2015. FINDINGS: A limited examination was performed in the supine position. Patient drank water soluble contrast. The lower esophagus is markedly tortuous. Large hiatal hernia. Assessment for a mass or stricture is limited by suboptimal distension and opacification of the esophagus and hiatal hernia. IMPRESSION: 1. Difficulty passing nasogastric tube likely due to marked lower esophageal tortuosity. 2. Difficult to assess for a mass due to limited opacification and distention of the esophagus. 3. Large hiatal hernia. Electronically Signed   By: Lorin Picket M.D.   On: 12/20/2015 09:27   Dg Abd Portable 1v  Result Date: 12/20/2015 CLINICAL DATA:  Small bowel obstruction history of previous perforated gastric ulcer in 2016. EXAM: PORTABLE ABDOMEN - 1 VIEW COMPARISON:  Portable supine abdominal radiographs of December 19, 2015 FINDINGS: There remain loops of moderately distended gas-filled small bowel in the mid abdomen. A few loops of normal caliber bowel are noted as well. There is some stool and gas in the colon and rectum. No free extraluminal gas collections are observed. There  are degenerative changes of the lumbar spine with moderate levocurvature centered at L1. Numerous coils from previous hernia repair are present. IMPRESSION: Bowel gas pattern consistent with a clinically known partial mid to distal small bowel obstruction. No evidence of perforation currently. Electronically Signed   By: David  Martinique M.D.   On: 12/20/2015 07:46   Dg Abd Portable 1v  Result Date: 12/19/2015 CLINICAL DATA:  82 year old female with history  of small-bowel obstruction. EXAM: PORTABLE ABDOMEN - 1 VIEW COMPARISON:  Abdominal radiograph 08/06/2014. FINDINGS: Gas and stool are seen scattered throughout the colon extending to the level of the distal rectum. There are a few mildly dilated loops of small bowel, most evident in the left lower quadrant of the abdomen measuring up to 3.6 cm in diameter. No gross evidence of pneumoperitoneum. Numerous soft tissue anchors are seen projecting over the abdomen, presumably from prior mesh repair for ventral hernia. Iodinated contrast material within the lumen of the urinary bladder related to recent CT examination. Dextroscoliosis of the lumbar spine convex to the right at the level of L2. Severe multilevel degenerative disc disease in the thoracolumbar spine. IMPRESSION: 1. Findings again suggest partial small bowel obstruction. 2. No pneumoperitoneum. Electronically Signed   By: Vinnie Langton M.D.   On: 12/19/2015 08:40   Dg Addison Bailey G Tube Plc W/fl W/rad  Result Date: 12/19/2015 CLINICAL DATA:  82 year old female with small bowel obstruction in need of nasogastric tube. EXAM: NASO G TUBE PLACEMENT WITH FL AND WITH RAD CONTRAST:  None. FLUOROSCOPY TIME:  Fluoroscopy Time:  6 minutes and 42 seconds COMPARISON:  None. FINDINGS: Despite multiple attempts to fluoroscopically place a nasogastric tube into the patient's stomach, significant resistance was met in the region of the distal third of the esophagus at every attempt, with inability to pass the nasogastric tube beyond the area of apparent physical obstruction. During no time was the tube even able to extend into the intrathoracic portion of the patient's large hiatal hernia. Accordingly, the tube was removed from the patient, and the examination was terminated. IMPRESSION: 1. Unsuccessful attempt to fluoroscopically place a nasogastric tube into the stomach. Based upon review of the recent CT the abdomen and pelvis, this is presumably related to the patient's  complex anatomy. The patient has a very large hiatal hernia, within acute tortuosity of the esophagus as it leads toward the hernia. Given the inability to pass the tube, the possibility of a proximal obstructing mass should be considered, and further evaluation with endoscopy, contrast enhanced chest CT, and/or barium swallow is recommended to better evaluate these findings. Electronically Signed   By: Vinnie Langton M.D.   On: 12/19/2015 15:12    Anti-infectives: Anti-infectives    Start     Dose/Rate Route Frequency Ordered Stop   12/20/15 1000  ciprofloxacin (CIPRO) IVPB 400 mg     400 mg 200 mL/hr over 60 Minutes Intravenous 2 times daily 12/20/15 0907     12/20/15 0945  metroNIDAZOLE (FLAGYL) IVPB 500 mg     500 mg 100 mL/hr over 60 Minutes Intravenous Every 8 hours 12/20/15 0907     12/18/15 0430  ciprofloxacin (CIPRO) IVPB 400 mg  Status:  Discontinued     400 mg 200 mL/hr over 60 Minutes Intravenous 2 times daily 12/18/15 0427 12/20/15 0904   12/18/15 0430  metroNIDAZOLE (FLAGYL) IVPB 500 mg  Status:  Discontinued     500 mg 100 mL/hr over 60 Minutes Intravenous Every 8 hours 12/18/15 0427 12/20/15 0904  12/18/15 0345  piperacillin-tazobactam (ZOSYN) IVPB 3.375 g  Status:  Discontinued     3.375 g 100 mL/hr over 30 Minutes Intravenous  Once 12/18/15 0330 12/18/15 0427       Assessment/Plan S/P laparoscopic incisional hernia repair with Goretex mesh by Dr. Zella Richer about 20y ago - Dr. Excell Seltzer has been following her in the office and has periodically debrided some of the Gore-Tex mesh from her wound - continue dressing changes S/p repair perforated gastric ulcer 07/2014 Dr. Excell Seltzer SBO - NG placement unsuccessful after multiple attempts - XR this AM shows persistent distended loops of small bowel noted with slight progression from prior exam, oral contrast in the colon; persistent partial small bowel obstruction - persistent abdominal pain and distension, no flatus  today ?Diverticulitis - cipro/flagyl per internal medicine  Hyponatremia - patient refused labs today Chronic pain GERD - IV famotidine for now, likely transition back to PPI when no longer NPO Hiatal hernia  ID - cipro/flagyl FEN - NPO, IVF VTE - SCD, lovenox (hold today for procedure)  Plan - Continued abdominal pain and distension. Discussed with MD and plan to proceed to OR later today for ex lap. pending PICC placement for TNA.    LOS: 3 days    Jerrye Beavers , [facility] Surgery 12/21/2015, 7:39 AM Pager: [phone] Consults: [phone] Mon-Fri 7:00 am-4:30 pm Sat-Sun 7:00 am-11:30 am

## 2015-12-22 ENCOUNTER — Inpatient Hospital Stay (HOSPITAL_COMMUNITY): Payer: PPO

## 2015-12-22 ENCOUNTER — Encounter (HOSPITAL_COMMUNITY): Payer: Self-pay | Admitting: Surgery

## 2015-12-22 LAB — BASIC METABOLIC PANEL
Anion gap: 10 (ref 5–15)
CHLORIDE: 110 mmol/L (ref 101–111)
CO2: 17 mmol/L — AB (ref 22–32)
CREATININE: 0.76 mg/dL (ref 0.44–1.00)
Calcium: 7.9 mg/dL — ABNORMAL LOW (ref 8.9–10.3)
GFR calc Af Amer: >60 mL/min (ref >60)
GFR calc non Af Amer: >60 mL/min (ref >60)
GLUCOSE: 112 mg/dL — AB (ref 65–99)
Potassium: 3.3 mmol/L — ABNORMAL LOW (ref 3.5–5.1)
Sodium: 137 mmol/L (ref 135–145)

## 2015-12-22 LAB — CBC
HEMATOCRIT: 34.4 % — AB (ref 36.0–46.0)
HEMOGLOBIN: 11.2 g/dL — AB (ref 12.0–15.0)
MCH: 31.1 pg (ref 26.0–34.0)
MCHC: 32.6 g/dL (ref 30.0–36.0)
MCV: 95.6 fL (ref 78.0–100.0)
Platelets: 410 10*3/uL — ABNORMAL HIGH (ref 150–400)
RBC: 3.6 MIL/uL — ABNORMAL LOW (ref 3.87–5.11)
RDW: 14.3 % (ref 11.5–15.5)
WBC: 21.7 10*3/uL — ABNORMAL HIGH (ref 4.0–10.5)

## 2015-12-22 MED ORDER — ONDANSETRON HCL 4 MG/2ML IJ SOLN: 4.0000 mg | Freq: Four times a day (QID) | INTRAMUSCULAR | Status: DC | PRN

## 2015-12-22 MED ORDER — DIPHENHYDRAMINE HCL 12.5 MG/5ML PO ELIX: 12.5000 mg | ORAL_SOLUTION | Freq: Four times a day (QID) | ORAL | Status: DC | PRN

## 2015-12-22 MED ORDER — NALOXONE HCL 0.4 MG/ML IJ SOLN: 0.4000 mg | INTRAMUSCULAR | Status: DC | PRN

## 2015-12-22 MED ORDER — SODIUM CHLORIDE 0.9% FLUSH: 9.0000 mL | INTRAVENOUS | Status: DC | PRN

## 2015-12-22 MED ORDER — SODIUM CHLORIDE 0.9% FLUSH
10.0000 mL | INTRAVENOUS | Status: DC | PRN
  Administered 2015-12-24 – 2016-01-06 (×4): 10 mL
  Filled 2015-12-22 (×4): qty 40

## 2015-12-22 MED ORDER — DIPHENHYDRAMINE HCL 50 MG/ML IJ SOLN: 12.5000 mg | Freq: Four times a day (QID) | INTRAMUSCULAR | Status: DC | PRN

## 2015-12-22 MED ORDER — MORPHINE SULFATE 2 MG/ML IV SOLN
INTRAVENOUS | Status: DC
  Administered 2015-12-22: 3 mg via INTRAVENOUS
  Administered 2015-12-22: 7 mg via INTRAVENOUS
  Administered 2015-12-22: 0 mg via INTRAVENOUS
  Administered 2015-12-22: 1.75 mg via INTRAVENOUS
  Administered 2015-12-23: 10.5 mg via INTRAVENOUS
  Filled 2015-12-22: qty 25

## 2015-12-22 NOTE — Progress Notes (Signed)
Peripherally Inserted Central Catheter/Midline Placement  The IV Nurse has discussed with the patient and/or persons authorized to consent for the patient, the purpose of this procedure and the potential benefits and risks involved with this procedure.  The benefits include less needle sticks, lab draws from the catheter, and the patient may be discharged home with the catheter. Risks include, but not limited to, infection, bleeding, blood clot (thrombus formation), and puncture of an artery; nerve damage and irregular heartbeat and possibility to perform a PICC exchange if needed/ordered by physician.  Alternatives to this procedure were also discussed.  Bard Power PICC patient education guide, fact sheet on infection prevention and patient information card has been provided to patient /or left at bedside.    PICC/Midline Placement Documentation        Monique Russo 12/22/2015, 9:10 AM

## 2015-12-22 NOTE — Progress Notes (Signed)
1 Day Post-Op  Subjective: Having a lot of incisional pain as expected  Objective: Vital signs in last 24 hours: Temp:  [98.1 F (36.7 C)-98.6 F (37 C)] 98.6 F (37 C) (10/03 2037) Pulse Rate:  [91-117] 117 (10/03 2037) Resp:  [14-27] 18 (10/04 0950) BP: (106-138)/(61-80) 106/61 (10/03 2037) SpO2:  [91 %-98 %] 94 % (10/04 0950) Last BM Date: 12/20/15 (X's 2)  Intake/Output from previous day: 10/03 0701 - 10/04 0700 In: 2400 [I.V.:2100; IV Piggyback:300] Out: 1065 [Urine:780; Drains:210; Blood:75] Intake/Output this shift: No intake/output data recorded.  Exam: Abdomen soft, tender, drain serosang  Lab Results:   Recent Labs  12/20/15 0541 12/22/15 0728  WBC 8.5 21.7*  HGB 10.1* 11.2*  HCT 32.1* 34.4*  PLT 366 410*   BMET  Recent Labs  12/20/15 0541 12/22/15 0728  NA 136 137  K 3.6 3.3*  CL 106 110  CO2 23 17*  GLUCOSE 86 112*  BUN 9 <5*  CREATININE 0.76 0.76  CALCIUM 8.7* 7.9*   PT/INR No results for input(s): LABPROT, INR in the last 72 hours. ABG No results for input(s): PHART, HCO3 in the last 72 hours.  Invalid input(s): PCO2, PO2  Studies/Results: Dg Abd Portable 1v  Result Date: 12/21/2015 CLINICAL DATA:  Small-bowel obstruction. EXAM: PORTABLE ABDOMEN - 1 VIEW COMPARISON:  12/20/2015.  12/19/2015.  CT 12/18/2015. FINDINGS: Prior hernia repair. Persistent distended loops of small bowel noted with slight progression from prior exam. Oral contrast noted in colon. These findings are again consistent with partial small bowel obstruction. No free air. Degenerative changes scoliosis lumbar spine. IMPRESSION: Persistent distended loops of small bowel noted with slight progression from prior exam. Oral contrast in the colon. These findings are consistent with persistent partial small bowel obstruction. Electronically Signed   By: Marcello Moores  Register   On: 12/21/2015 07:37    Anti-infectives: Anti-infectives    Start     Dose/Rate Route Frequency Ordered  Stop   12/20/15 1000  ciprofloxacin (CIPRO) IVPB 400 mg     400 mg 200 mL/hr over 60 Minutes Intravenous 2 times daily 12/20/15 0907     12/20/15 0945  metroNIDAZOLE (FLAGYL) IVPB 500 mg     500 mg 100 mL/hr over 60 Minutes Intravenous Every 8 hours 12/20/15 0907     12/18/15 0430  ciprofloxacin (CIPRO) IVPB 400 mg  Status:  Discontinued     400 mg 200 mL/hr over 60 Minutes Intravenous 2 times daily 12/18/15 0427 12/20/15 0904   12/18/15 0430  metroNIDAZOLE (FLAGYL) IVPB 500 mg  Status:  Discontinued     500 mg 100 mL/hr over 60 Minutes Intravenous Every 8 hours 12/18/15 0427 12/20/15 0904   12/18/15 0345  piperacillin-tazobactam (ZOSYN) IVPB 3.375 g  Status:  Discontinued     3.375 g 100 mL/hr over 30 Minutes Intravenous  Once 12/18/15 0330 12/18/15 0427      Assessment/Plan: s/p Procedure(s): EXPLORATORY LAPAROTOMY/ REMOVAL OF MESH (N/A)  Amount of pain not surprising given the need to remove the previous mesh with the old tacks embedded in the abdominal wall muscle. Will go very slowly with any po given the inability to place an NG  LOS: 4 days    Leoni Goodness A 12/22/2015

## 2015-12-22 NOTE — Progress Notes (Signed)
PROGRESS NOTE  Monique Russo [id number] DOB: 05/05/1930 DOA: 12/17/2015 PCP: Horatio Pel, MD   LOS: 4 days   Brief Narrative: Monique Russo a 82 y.o.femalewith a past medical history significant for chronic pain on methadone and chronic non-healing surgical woundwho presented with abdominal pain and vomiting for 3 days. The patient had a perforated ulcer 1 year ago requiring [name] patch.This surgery was unfortunately complicated by nonhealing surgical wound (because of 82 year old infected hernia repair mesh?), Which for the last year the patient has been having wound care by her general surgeon in the office every six weeks (per family). This week, two days prior to admission, the patient had an office debridement and described that Dr. Excell Seltzer did his usual debridement, after which she began to develop nausea and LLQ abdominal pain. This crampy, severe, LLQ aching abdominal pain continued intermittently and was associated with several episodes of nonbloody emesis and obstipation. On the morning of admission, she was recommended by surgery office for ER evaluation. CT of the abdomen and pelvis was obtained that showed small bowel obstruction with transition point in the right mid abdomenand stranding around the colon, consistent with diverticulitis. Surgery was consulted and patient was admitted for further workup. Patient's condition did not improve and she underwent ExLap, LOA, removal of prior mesh and complex abdominal wall closure and hernia repair with biologic mesh on 10/3.  Assessment & Plan: Principal Problem:   Small bowel obstruction Active Problems:   Hyponatremia   Chronic pain syndrome   Diverticulitis of large intestine without perforation or abscess without bleeding   SBO (small bowel obstruction)   Small bowel obstruction - Per patient, had a BM 10/2, however abdominal x-ray 10/3 showed persistent high-grade small bowel obstruction - general  surgery, following, patient underwent ExLap, LOA, removal of prior mesh and complex abdominal wall closure and hernia repair with biologic mesh on 10/3. - continue NPO - morphine PCA for pain, will slightly increase dose this morning - if pain poorly controlled will call palliative for assistance  Diverticulitis - There is stranding around the colon that suggests diverticulitis. - continue Ciprofloxacin and Flagyl IV   Hyponatremia - Improving, continue  IV fluid hydration  Chronic pain syndrome - Hold methadone and hydrocodone while NPO - morphine PCA  GERD and hiatal hernia - Replace PPI with IV famotidine while NPO  - DG barium esophagus showed marked lower esophageal tortuosity with large hiatal hernia but no mass. For definitive imaging, patient will need EGD but this can be done at a later date  Acute hypoxic respiratory failure - post op requiring 4L [place] - positive almost 5 L - CXR, decrease IVF rate   DVT prophylaxis: SCD Code Status: Full Family Communication: d/w daughter at bedside Disposition Plan: remain inpatient  Consultants:   General surgery  Procedures:   ExLap, LOA, removal of prior mesh and complex abdominal wall closure and hernia repair with biologic mesh on 10/3.  Foley: yes  Antimicrobials:  Ciprofloxacin 9/30 >>  Flagyl 9/30 >>   Subjective: - appears very uncomfortable, in severe pain while awake. Once she pushes the PCA, sleeps however creams in pain when is awake again. Abdomen hurts  Objective: Vitals:   12/22/15 0335 12/22/15 0802 12/22/15 0950 12/22/15 1129  BP:    129/75  Pulse:    (!) 120  Resp: 16 (!) 22 18 (!) 23  Temp:    100.1 F (37.8 C)  TempSrc:    Oral  SpO2: 92% 91% 94% 92%  Weight:      Height:        Intake/Output Summary (Last 24 hours) at 12/22/15 1311 Last data filed at 12/22/15 0525  Gross per 24 hour  Intake             1550 ml  Output              550 ml  Net             1000 ml   Filed Weights    12/17/15 2249 12/18/15 1238  Weight: 73 kg (161 lb) 72.6 kg (160 lb)    Examination: Constitutional: in pain,appears distressed Vitals:   12/22/15 0335 12/22/15 0802 12/22/15 0950 12/22/15 1129  BP:    129/75  Pulse:    (!) 120  Resp: 16 (!) 22 18 (!) 23  Temp:    100.1 F (37.8 C)  TempSrc:    Oral  SpO2: 92% 91% 94% 92%  Weight:      Height:       Eyes: PERRL Neck: normal, supple Respiratory: clear to auscultation bilaterally, no wheezing, no crackles.  Cardiovascular: Regular rate and rhythm, no murmurs / rubs / gallops. No LE edema. Abdomen: dressing in place, C/D/I Musculoskeletal: no clubbing / cyanosis. Skin: no rashes, lesions, ulcers.  Neurologic: non focal    Data Reviewed: I have personally reviewed following labs and imaging studies  CBC:  Recent Labs Lab 12/17/15 2359 12/18/15 1510 12/19/15 0528 12/20/15 0541 12/22/15 0728  WBC 17.1* 8.3 7.1 8.5 21.7*  NEUTROABS 14.3*  --   --   --   --   HGB 13.9 12.6 10.6* 10.1* 11.2*  HCT 41.2 38.7 33.9* 32.1* 34.4*  MCV 93.8 96.3 96.0 96.4 95.6  PLT 506* 463* 381 366 [id number]*   Basic Metabolic Panel:  Recent Labs Lab 12/18/15 0635 12/18/15 1510 12/19/15 0528 12/20/15 0541 12/22/15 0728  NA 130* 130* 137 136 137  K 4.5 4.2 4.1 3.6 3.3*  CL 96* 95* 104 106 110  CO2 23 23 26 23  17*  GLUCOSE 131* 119* 96 86 112*  BUN 12 15 13 9  <5*  CREATININE 0.87 1.19* 0.88 0.76 0.76  CALCIUM 9.6 9.6 9.3 8.7* 7.9*  MG 2.0  --   --   --   --    GFR: Estimated Creatinine Clearance: 47.4 mL/min (by C-G formula based on SCr of 0.76 mg/dL). Liver Function Tests:  Recent Labs Lab 12/17/15 2359 12/18/15 1510  AST 19 27  ALT 11* 11*  ALKPHOS 83 74  BILITOT 0.7 0.5  PROT 8.2* 7.1  ALBUMIN 2.9* 2.7*   No results for input(s): LIPASE, AMYLASE in the last 168 hours. No results for input(s): AMMONIA in the last 168 hours. Coagulation Profile: No results for input(s): INR, PROTIME in the last 168 hours. Cardiac  Enzymes: No results for input(s): CKTOTAL, CKMB, CKMBINDEX, TROPONINI in the last 168 hours. BNP (last 3 results) No results for input(s): PROBNP in the last 8760 hours. HbA1C: No results for input(s): HGBA1C in the last 72 hours. CBG: No results for input(s): GLUCAP in the last 168 hours. Lipid Profile: No results for input(s): CHOL, HDL, LDLCALC, TRIG, CHOLHDL, LDLDIRECT in the last 72 hours. Thyroid Function Tests: No results for input(s): TSH, T4TOTAL, FREET4, T3FREE, THYROIDAB in the last 72 hours. Anemia Panel: No results for input(s): VITAMINB12, FOLATE, FERRITIN, TIBC, IRON, RETICCTPCT in the last 72 hours. Urine analysis:    Component Value Date/Time   COLORURINE AMBER (A)  12/18/2015 [place] 12/18/2015 1507   LABSPEC >1.046 (H) 12/18/2015 1507   PHURINE 5.5 12/18/2015 1507   GLUCOSEU NEGATIVE 12/18/2015 1507   HGBUR NEGATIVE 12/18/2015 1507   BILIRUBINUR SMALL (A) 12/18/2015 1507   KETONESUR NEGATIVE 12/18/2015 1507   PROTEINUR NEGATIVE 12/18/2015 1507   UROBILINOGEN 0.2 08/04/2014 2100   NITRITE NEGATIVE 12/18/2015 1507   LEUKOCYTESUR NEGATIVE 12/18/2015 1507   Sepsis Labs: Invalid input(s): PROCALCITONIN, LACTICIDVEN  Recent Results (from the past 240 hour(s))  Urine culture     Status: None   Collection Time: 12/17/15  3:07 PM  Result Value Ref Range Status   Specimen Description URINE, RANDOM  Final   Special Requests NONE  Final   Culture NO GROWTH  Final   Report Status 12/19/2015 FINAL  Final  Culture, blood (Routine x 2)     Status: None (Preliminary result)   Collection Time: 12/17/15 11:55 PM  Result Value Ref Range Status   Specimen Description BLOOD RIGHT HAND  Final   Special Requests IN PEDIATRIC BOTTLE 1ML  Final   Culture NO GROWTH 3 DAYS  Final   Report Status PENDING  Incomplete  Culture, blood (Routine x 2)     Status: None (Preliminary result)   Collection Time: 12/17/15 11:59 PM  Result Value Ref Range Status    Specimen Description BLOOD RIGHT ARM  Final   Special Requests BOTTLES DRAWN AEROBIC AND ANAEROBIC 5ML  Final   Culture NO GROWTH 3 DAYS  Final   Report Status PENDING  Incomplete  MRSA PCR Screening     Status: None   Collection Time: 12/18/15  2:03 PM  Result Value Ref Range Status   MRSA by PCR NEGATIVE NEGATIVE Final    Comment:        The GeneXpert MRSA Assay (FDA approved for NASAL specimens only), is one component of a comprehensive MRSA colonization surveillance program. It is not intended to diagnose MRSA infection nor to guide or monitor treatment for MRSA infections.       Radiology Studies: Dg Abd Portable 1v  Result Date: 12/21/2015 CLINICAL DATA:  Small-bowel obstruction. EXAM: PORTABLE ABDOMEN - 1 VIEW COMPARISON:  12/20/2015.  12/19/2015.  CT 12/18/2015. FINDINGS: Prior hernia repair. Persistent distended loops of small bowel noted with slight progression from prior exam. Oral contrast noted in colon. These findings are again consistent with partial small bowel obstruction. No free air. Degenerative changes scoliosis lumbar spine. IMPRESSION: Persistent distended loops of small bowel noted with slight progression from prior exam. Oral contrast in the colon. These findings are consistent with persistent partial small bowel obstruction. Electronically Signed   By: Marcello Moores  Register   On: 12/21/2015 07:37     Scheduled Meds: . ciprofloxacin  400 mg Intravenous BID  . diatrizoate meglumine-sodium  90 mL Oral Once  . enoxaparin (LOVENOX) injection  40 mg Subcutaneous Q24H  . famotidine (PEPCID) IV  20 mg Intravenous Q24H  . metronidazole  500 mg Intravenous Q8H  . morphine   Intravenous Q4H  . timolol  1 drop Both Eyes QHS  . timolol  2 drop Both Eyes Daily   Continuous Infusions: . sodium chloride 50 mL/hr at 12/22/15 1133  . lactated ringers 50 mL/hr at 12/21/15 1010    Marzetta Board, MD, PhD [facility] Pager [phone]  If 7PM-7AM, please  contact night-coverage [email or web address] Password [email or web address] 12/22/2015, 1:11 PM

## 2015-12-22 NOTE — Care Management Important Message (Signed)
Important Message  Patient Details  Name: Monique Russo MRN: [id number] Date of Birth: December 16, 1930   Medicare Important Message Given:  Yes    [name] Abena 12/22/2015, 11:15 AM

## 2015-12-22 NOTE — Op Note (Signed)
NAMEMIQUELLE, HUNKELE NO.:  [id number]  MEDICAL RECORD NO.:  [id number]  LOCATION:  6N06C                        FACILITY:  [facility]  PHYSICIAN:  Coralie Keens, M.D. DATE OF BIRTH:  01-06-1931  DATE OF PROCEDURE:  12/21/2015 DATE OF DISCHARGE:                              OPERATIVE REPORT   PREOPERATIVE DIAGNOSIS:  Small-bowel obstruction.  POSTOPERATIVE DIAGNOSIS:  Small-bowel obstruction.  PROCEDURE: 1. Exploratory laparotomy. 2. Lysis of adhesions. 3. Removal of previously placed mesh. 4. Complex abdominal wall closure and hernia repair with biologic mesh     (20 cm x 25 cm Phasix).  SURGEON:  Coralie Keens, M.D.  ASSISTANT:  Georganna Skeans, M.D.  ANESTHESIA:  General endotracheal anesthesia.  ESTIMATED BLOOD LOSS:  Minimal.  This was a very complex procedure requiring 2 surgeons to lyse the adhesions and complete the complex abdominal wall closure.  INDICATIONS:  This is an 82 year old female, who presents with a small- bowel obstruction.  She has had a previously placed piece of Gore-Tex mesh approximately 17 years ago for a ventral hernia repair.  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  Incidentally, she now presents with a small-bowel obstruction.  Decision was made to proceed to the operating room.  FINDINGS:  The patient was found to have multiple adhesions and a partial small-bowel obstruction.  No bowel had to be resected.  Because of her previous abdominal wall infections and chronic infection of the mesh, the entire piece of Gore-Tex mesh was removed.  I then underwent complex abdominal wall closure using biologic mesh in order to reapproximate her abdominal wall.  PROCEDURE IN DETAIL:  The patient was brought to the operating room, identified as Monique Russo.  She was placed supine on the operating room table and general anesthesia was induced.  Her abdomen was then prepped and draped in usual sterile fashion.  She  had an open wound just to the right of the midline in the upper abdomen where she has been having mesh removed.  I created a midline incision through previous scar with a scalpel.  I took this down to the underlying mesh with the electrocautery.  I was able to separate the underlying biologic membrane from the overlying Gore-Tex mesh and then incised the mesh right down the middle and gain entrance into the abdominal cavity.  I then undertook lysis of adhesions freeing up loops of small bowel from the abdominal wall and mesh as well as from inner loops.  I was then able to completely eviscerate the small bowel right from the ligament of Treitz to the terminal ileum and lysed all adhesions.  The patient had a partial obstruction from bowel stuck to the abdominal wall.  No bowel was injured.  There was only one serosal area which I closed with silk sutures.  At this point, again the bowel was evaluated and found to be intact.  At this point, the decision was made to remove all the Gore-Tex mesh and previously placed titanium tacks.  I did this with the electrocautery circumferentially around the fascia and peritoneum.  At this point, it became apparent that we could not reapproximate the fascia because of the  previous hernia.  I undermined the skin circumferentially and widely.  I then performed a relaxing incision on the rectus sheath laterally on both sides of the incision.  Despite the relaxing incisions, I still could not get the fascia completely closed. At this point, I brought a 20 cm x 25 cm piece of Phasix mesh onto the field.  I placed it as an underlay underneath the fascia, peritoneum and sewed it in place circumferentially with #1 Novafil sutures.  I then was able to close some of the fascia and soft tissue over the top of the mesh both superiorly and inferiorly but still left about a 6 x 6 cm round area open with exposed Phasix mesh.  I then made a separate skin incision and  placed a 19-French Blake drain into the wound.  We irrigated the wound extensively.  I then sewed the drain in place with a nylon suture and closed the skin over top of the mesh with staples. Occlusive dressing was then applied.  The patient tolerated the procedure well.  All the counts were correct at the end of the procedure.  The patient was then extubated in the operating room and taken in stable condition to recovery room.     Coralie Keens, M.D.     DB/MEDQ  D:  12/21/2015  T:  12/22/2015  Job:  [id number]

## 2015-12-23 LAB — CBC
HCT: 30.2 % — ABNORMAL LOW (ref 36.0–46.0)
Hemoglobin: 9.6 g/dL — ABNORMAL LOW (ref 12.0–15.0)
MCH: 30.4 pg (ref 26.0–34.0)
MCHC: 31.8 g/dL (ref 30.0–36.0)
MCV: 95.6 fL (ref 78.0–100.0)
PLATELETS: 405 10*3/uL — AB (ref 150–400)
RBC: 3.16 MIL/uL — AB (ref 3.87–5.11)
RDW: 14.7 % (ref 11.5–15.5)
WBC: 19.8 10*3/uL — AB (ref 4.0–10.5)

## 2015-12-23 LAB — COMPREHENSIVE METABOLIC PANEL
ALT: 9 U/L — AB (ref 14–54)
AST: 14 U/L — AB (ref 15–41)
Albumin: 1.7 g/dL — ABNORMAL LOW (ref 3.5–5.0)
Alkaline Phosphatase: 57 U/L (ref 38–126)
Anion gap: 13 (ref 5–15)
BUN: 5 mg/dL — ABNORMAL LOW (ref 6–20)
CO2: 22 mmol/L (ref 22–32)
CREATININE: 0.77 mg/dL (ref 0.44–1.00)
Calcium: 8.4 mg/dL — ABNORMAL LOW (ref 8.9–10.3)
Chloride: 104 mmol/L (ref 101–111)
Glucose, Bld: 95 mg/dL (ref 65–99)
POTASSIUM: 3.1 mmol/L — AB (ref 3.5–5.1)
SODIUM: 139 mmol/L (ref 135–145)
Total Bilirubin: 0.8 mg/dL (ref 0.3–1.2)
Total Protein: 4.8 g/dL — ABNORMAL LOW (ref 6.5–8.1)

## 2015-12-23 LAB — CULTURE, BLOOD (ROUTINE X 2)
CULTURE: NO GROWTH
CULTURE: NO GROWTH

## 2015-12-23 MED ORDER — ONDANSETRON HCL 4 MG/2ML IJ SOLN: 4.0000 mg | Freq: Four times a day (QID) | INTRAMUSCULAR | Status: DC | PRN

## 2015-12-23 MED ORDER — DIPHENHYDRAMINE HCL 50 MG/ML IJ SOLN: 12.5000 mg | Freq: Four times a day (QID) | INTRAMUSCULAR | Status: DC | PRN

## 2015-12-23 MED ORDER — MORPHINE SULFATE 2 MG/ML IV SOLN
INTRAVENOUS | Status: DC
  Administered 2015-12-23: 14 mg via INTRAVENOUS
  Administered 2015-12-23: 14:00:00 via INTRAVENOUS
  Administered 2015-12-23: 8.75 mg via INTRAVENOUS
  Administered 2015-12-23: 5.25 mg via INTRAVENOUS
  Filled 2015-12-23: qty 25

## 2015-12-23 MED ORDER — FAT EMULSION 20 % IV EMUL
240.0000 mL | INTRAVENOUS | Status: DC
  Filled 2015-12-23: qty 250

## 2015-12-23 MED ORDER — POTASSIUM CHLORIDE IN NACL 20-0.9 MEQ/L-% IV SOLN: INTRAVENOUS | Status: AC

## 2015-12-23 MED ORDER — POTASSIUM CHLORIDE IN NACL 20-0.9 MEQ/L-% IV SOLN
INTRAVENOUS | Status: DC
  Administered 2015-12-23: 09:00:00 via INTRAVENOUS
  Filled 2015-12-23: qty 1000

## 2015-12-23 MED ORDER — INSULIN ASPART 100 UNIT/ML ~~LOC~~ SOLN
0.0000 [IU] | Freq: Four times a day (QID) | SUBCUTANEOUS | Status: DC
  Administered 2015-12-24: 1 [IU] via SUBCUTANEOUS
  Administered 2015-12-24: 2 [IU] via SUBCUTANEOUS
  Administered 2015-12-25 (×3): 1 [IU] via SUBCUTANEOUS
  Administered 2015-12-25: 2 [IU] via SUBCUTANEOUS
  Administered 2015-12-26 – 2015-12-27 (×4): 1 [IU] via SUBCUTANEOUS

## 2015-12-23 MED ORDER — SODIUM CHLORIDE 0.9% FLUSH: 9.0000 mL | INTRAVENOUS | Status: DC | PRN

## 2015-12-23 MED ORDER — DIPHENHYDRAMINE HCL 12.5 MG/5ML PO ELIX: 12.5000 mg | ORAL_SOLUTION | Freq: Four times a day (QID) | ORAL | Status: DC | PRN

## 2015-12-23 MED ORDER — FAT EMULSION 20 % IV EMUL
240.0000 mL | INTRAVENOUS | Status: AC
  Administered 2015-12-23: 240 mL via INTRAVENOUS
  Filled 2015-12-23: qty 250

## 2015-12-23 MED ORDER — MORPHINE SULFATE 2 MG/ML IV SOLN
INTRAVENOUS | Status: DC
  Administered 2015-12-23: 15.75 mg via INTRAVENOUS
  Administered 2015-12-24: 01:00:00 via INTRAVENOUS
  Administered 2015-12-24: 14 mg via INTRAVENOUS
  Administered 2015-12-24: 3.5 mg via INTRAVENOUS
  Filled 2015-12-23: qty 25

## 2015-12-23 MED ORDER — POTASSIUM CHLORIDE 10 MEQ/50ML IV SOLN
10.0000 meq | INTRAVENOUS | Status: AC
  Administered 2015-12-23 (×6): 10 meq via INTRAVENOUS
  Filled 2015-12-23 (×6): qty 50

## 2015-12-23 MED ORDER — NALOXONE HCL 0.4 MG/ML IJ SOLN: 0.4000 mg | INTRAMUSCULAR | Status: DC | PRN

## 2015-12-23 MED ORDER — SODIUM CHLORIDE 0.9 % IV SOLN
INTRAVENOUS | Status: DC
  Filled 2015-12-23: qty 1000

## 2015-12-23 MED ORDER — TRACE MINERALS CR-CU-MN-SE-ZN 10-1000-500-60 MCG/ML IV SOLN
INTRAVENOUS | Status: AC
  Administered 2015-12-23: 18:00:00 via INTRAVENOUS
  Filled 2015-12-23: qty 960

## 2015-12-23 MED ORDER — SODIUM CHLORIDE 0.9 % IV SOLN: INTRAVENOUS | Status: DC

## 2015-12-23 MED ORDER — TRACE MINERALS CR-CU-MN-SE-ZN 10-1000-500-60 MCG/ML IV SOLN
INTRAVENOUS | Status: DC
  Filled 2015-12-23: qty 960

## 2015-12-23 MED ORDER — POTASSIUM CHLORIDE 10 MEQ/100ML IV SOLN
INTRAVENOUS | Status: AC
  Filled 2015-12-23: qty 100

## 2015-12-23 NOTE — Progress Notes (Signed)
Initial Nutrition Assessment  DOCUMENTATION CODES:   Obesity unspecified  INTERVENTION:   -TPN management per pharmacy -RD will follow for diet advancement  NUTRITION DIAGNOSIS:   Inadequate oral intake related to altered GI function as evidenced by NPO status.  GOAL:   Patient will meet greater than or equal to 90% of their needs  MONITOR:   Diet advancement, Labs, Weight trends, Skin, I & O's  REASON FOR ASSESSMENT:   NPO/Clear Liquid Diet, Consult New TPN/TNA  ASSESSMENT:   Monique Russo is a 82 y.o. female with a past medical history significant for chronic pain on methadone and chronic non-healing surgical wound who presents with abdominal pain and vomiting for 3 days.  Pt admitted with SBO.  S/p Procedure(s) on 12/21/15: EXPLORATORY LAPAROTOMY LYSIS OF ADHESIONS REMOVAL OF MESH COMPLEX ABDOMINAL WALL HERNIA CLOSURE WITH BIOLOGIC MESH (20 X 25 CM PHASIX)  Spoke with pt and sister at bedside. Pt sister reports poor oral intake within the past week; pt reports the last thing she ate was a half of an oatmeal cookie last Wednesday (12/15/15).   Pt reports that she lost about 10# over the past week PTA. She reports UBW is around 170#. However, there is no wt hx to confirm this finding.   Nutrition-Focused physical exam completed. Findings are mild fat depletion, no muscle depletion, and moderate edema.   Case discussed with RN. Pt already has PICC line in place. Pt has been NPO x 5 days during hospitalization, >7 days per pt report. RN confirms surgical service's plan to be very conservative with PO advancement, due to inability to place NGT operatively.   Per pharmacy note, TPN to be started at 1800; plan to initiate Clinimix E 5/15 at 40 ml/hr plus lipids at 10 ml/hr, which will provide 1162 kcals and 48 grams protein (which meets 83% of estimated kcal needs and 74% of estimated protein needs).   Labs reviewed: K: 3.1 (on IV supplementation).   Diet Order:   Diet NPO time specified Except for: Ice Chips TPN (CLINIMIX-E) Adult  Skin:  Reviewed, no issues  Last BM:  12/20/15  Height:   Ht Readings from Last 1 Encounters:  12/18/15 5' (1.524 m)    Weight:   Wt Readings from Last 1 Encounters:  12/18/15 160 lb (72.6 kg)    Ideal Body Weight:  45.5 kg  BMI:  Body mass index is 31.25 kg/m.  Estimated Nutritional Needs:   Kcal:  1400-1600  Protein:  65-80 grams  Fluid:  1.4-1.5 L  EDUCATION NEEDS:   No education needs identified at this time  Monique Russo A. Jimmye Norman, RD, LDN, CDE Pager: [phone] After hours Pager: [phone]

## 2015-12-23 NOTE — Evaluation (Signed)
Occupational Therapy Evaluation Patient Details Name: Monique Russo MRN: [id number] DOB: 1930/07/19 Today's Date: 12/23/2015    History of Present Illness 82 y.o. female with a past medical history significant for chronic pain on methadone and chronic non-healing surgical wound who presented with abdominal pain and vomiting for 3 days. Now s/p ExLap, LOA, removal of prior mesh and complex abdominal wall closure and hernia repair with biologic mesh on 10/3.   Clinical Impression   Pt admitted with the above diagnoses and presents with below problem list. Pt will benefit from continued acute OT to address the below listed deficits and maximize independence with basic ADLs prior to d/c to venue below. PTA needed assistance with bathing/dressing. She used a power chair and did not ambulate. Pt and sister report she did not stand PTA, sounds like she did lateral scoot transfers. Pt limited by 10/10 (Faces) back and abdominal pain; very anxious. Pt needing max encouragement and lengthy discussion of benefits of mobilization and risk of not mobilizing. Pt +2 total assist to sit EOB. Sat 1 minute with continued +2 assist. Encouraged deep breathing sitting EOB. Encouraged pt to continue mobilizing to sit EOB with assist from nursing/therapy at intervals throughout the day. Sister present and also encouraging pt during session.      Follow Up Recommendations  SNF    Equipment Recommendations  None recommended by OT    Recommendations for Other Services PT consult     Precautions / Restrictions Precautions Precautions: Fall;Other (comment) (abdominal incision and drain) Restrictions Weight Bearing Restrictions: No      Mobility Bed Mobility Overal bed mobility: +2 for physical assistance;Needs Assistance Bed Mobility: Supine to Sit;Sit to Supine     Supine to sit: Total assist;+2 for physical assistance;HOB elevated Sit to supine: Total assist;+2 for physical assistance   General  bed mobility comments: Pt resisting trunk flexion initially with max encouragment to sit EOB about 1 minute. +2 assist utilizing pad behind back.   Transfers                 General transfer comment: did not attempt due to zero sitting balance    Balance Overall balance assessment: Needs assistance Sitting-balance support: Bilateral upper extremity supported Sitting balance-Leahy Scale: Zero Sitting balance - Comments: +2 assist to maintain sitting posture EOB for about 1 minute. Pain a factor: back>abdomen.                                    ADL Overall ADL's : Needs assistance/impaired Eating/Feeding: NPO   Grooming: Set up;Minimal assistance;Bed level   Upper Body Bathing: Maximal assistance;Bed level   Lower Body Bathing: Maximal assistance;+2 for physical assistance;Bed level   Upper Body Dressing : Maximal assistance;Bed level   Lower Body Dressing: Maximal assistance;+2 for physical assistance;Bed level                 General ADL Comments: Pt needing max encouragement to come to EOB. Educated pt on importance of mobilizing and risks of not mobilizing.      Vision     Perception     Praxis      Pertinent Vitals/Pain Pain Assessment: Faces Faces Pain Scale: Hurts worst Pain Location: abdomen and back Pain Descriptors / Indicators: Grimacing;Moaning;Crying;Sore Pain Intervention(s): Limited activity within patient's tolerance;Monitored during session;Repositioned;PCA encouraged;Utilized relaxation techniques     Hand Dominance     Extremity/Trunk Assessment Upper Extremity  Assessment Upper Extremity Assessment: Generalized weakness   Lower Extremity Assessment Lower Extremity Assessment: Defer to PT evaluation       Communication Communication Communication: No difficulties   Cognition Arousal/Alertness: Awake/alert Behavior During Therapy: Anxious;Flat affect Overall Cognitive Status: Within Functional Limits for tasks  assessed                     General Comments       Exercises       Shoulder Instructions      Home Living Family/patient expects to be discharged to:: Private residence Living Arrangements: Spouse/significant other Available Help at Discharge: Family;Available 24 hours/day Type of Home: House                       Home Equipment: Wheelchair - power;Tub bench          Prior Functioning/Environment Level of Independence: Needs assistance  Gait / Transfers Assistance Needed: power chair ar baseline. lateral scooting for transfers. Does not stand at baseline, does sit EOB per pt and sister's report. ADL's / Homemaking Assistance Needed: aide assists with bathing/dressing            OT Problem List: Decreased strength;Decreased activity tolerance;Impaired balance (sitting and/or standing);Decreased safety awareness;Decreased knowledge of use of DME or AE;Decreased knowledge of precautions;Pain   OT Treatment/Interventions: Self-care/ADL training;DME and/or AE instruction;Therapeutic activities;Patient/family education;Balance training    OT Goals(Current goals can be found in the care plan section) Acute Rehab OT Goals Patient Stated Goal: feel better, less pain OT Goal Formulation: With patient/family Time For Goal Achievement: 01/06/16 Potential to Achieve Goals: Good ADL Goals Pt Will Perform Grooming: with supervision;sitting Pt Will Perform Upper Body Bathing: sitting;with max assist Pt Will Perform Upper Body Dressing: with max assist;sitting Additional ADL Goal #1: Pt will sit EOB 3 minutes at min guard level to facilitate with completion of UB ADLs.  Additional ADL Goal #2: Pt will complete lateral scoot transfer at mod A level as precursor to toilet/tub transfers.   OT Frequency: Min 2X/week   Barriers to D/C:            Co-evaluation              End of Session Equipment Utilized During Treatment: Oxygen Nurse Communication: Other  (comment) (Nurse present and involved in bed mobility.)  Activity Tolerance: Patient limited by pain;Other (comment) (very anxious) Patient left: in bed;with call bell/phone within reach;with family/visitor present   Time: 1400-1425 OT Time Calculation (min): 25 min Charges:  OT General Charges $OT Visit: 1 Procedure OT Evaluation $OT Eval Moderate Complexity: 1 Procedure OT Treatments $Self Care/Home Management : 8-22 mins G-Codes:    Hortencia Pilar 12-30-2015, 2:45 PM

## 2015-12-23 NOTE — Progress Notes (Signed)
PROGRESS NOTE  Monique Russo [id number] DOB: 1930/11/06 DOA: 12/17/2015 PCP: Monique Pel, MD   LOS: 5 days   Brief Narrative: Monique Russo a 82 y.o.femalewith a past medical history significant for chronic pain on methadone and chronic non-healing surgical woundwho presented with abdominal pain and vomiting for 3 days. The patient had a perforated ulcer 1 year ago requiring Monique Russo patch.This surgery was unfortunately complicated by nonhealing surgical wound (because of 82 year old infected hernia repair mesh?), Which for the last year the patient has been having wound care by her general surgeon in the office every six weeks (per family). This week, two days prior to admission, the patient had an office debridement and described that Dr. Excell Russo did his usual debridement, after which she began to develop nausea and LLQ abdominal pain. This crampy, severe, LLQ aching abdominal pain continued intermittently and was associated with several episodes of nonbloody emesis and obstipation. On the morning of admission, she was recommended by surgery office for ER evaluation. CT of the abdomen and pelvis was obtained that showed small bowel obstruction with transition point in the right mid abdomenand stranding around the colon, consistent with diverticulitis. Surgery was consulted and patient was admitted for further workup. Patient's condition did not improve and she underwent ExLap, LOA, removal of prior mesh and complex abdominal wall closure and hernia repair with biologic mesh on 10/3.  Assessment & Plan: Principal Problem:   Small bowel obstruction Active Problems:   Hyponatremia   Chronic pain syndrome   Diverticulitis of large intestine without perforation or abscess without bleeding   SBO (small bowel obstruction)   Small bowel obstruction - Per patient, had a BM 10/2, however abdominal x-ray 10/3 showed persistent high-grade small bowel obstruction - general  surgery, following, patient underwent ExLap, LOA, removal of prior mesh and complex abdominal wall closure and hernia repair with biologic mesh on 10/3. - continue NPO - morphine PCA for pain, responded well to increased dose, will back off tomorrow  Diverticulitis - There is stranding around the colon that suggests diverticulitis. - continue Ciprofloxacin and Flagyl IV, today day 6/10  Hyponatremia - Improving, continue IV fluid hydration. Fluid rate decreased yesterday to prevent fluid overload   Chronic pain syndrome - Hold methadone and hydrocodone while NPO - morphine PCA, stable on this regimen, will start weaning tomorrow.  GERD and hiatal hernia - Replace PPI with IV famotidine while NPO  - DG barium esophagus showed marked lower esophageal tortuosity with large hiatal hernia but no mass. For definitive imaging, patient will need EGD but this can be done at a later date  Acute hypoxic respiratory failure - post op requiring 4L [place] - positive almost 5 L - CXR without overt pulmonary edema 10/4, decrease IVF rate. Stable today. Wean off oxygen as tolerated. Strict I&O to assess UOP and fluid balance    DVT prophylaxis: SCD Code Status: Full Family Communication: d/w daughter at bedside Disposition Plan: remain inpatient  Consultants:   General surgery  Procedures:   ExLap, LOA, removal of prior mesh and complex abdominal wall closure and hernia repair with biologic mesh on 10/3.  Foley: yes  Antimicrobials:  Ciprofloxacin 9/30 >>  Flagyl 9/30 >>   Subjective: - more comfortable this morning, more alert. Doesn't remember much from yesterday  Objective: Vitals:   12/23/15 0400 12/23/15 0543 12/23/15 0800 12/23/15 1100  BP:  114/67 100/82 (!) 104/56  Pulse:  (!) 105 (!) 104 92  Resp: 12 (!) 8 18 12  Temp:  98.2 F (36.8 C) 98.2 F (36.8 C) 99 F (37.2 C)  TempSrc:  Axillary Oral Axillary  SpO2: 95% 90% 97% 97%  Weight:      Height:         Intake/Output Summary (Last 24 hours) at 12/23/15 1125 Last data filed at 12/23/15 0902  Gross per 24 hour  Intake          2241.42 ml  Output              630 ml  Net          1611.42 ml   Filed Weights   12/17/15 2249 12/18/15 1238  Weight: 73 kg (161 lb) 72.6 kg (160 lb)    Examination: Constitutional: in pain,appears distressed Vitals:   12/23/15 0400 12/23/15 0543 12/23/15 0800 12/23/15 1100  BP:  114/67 100/82 (!) 104/56  Pulse:  (!) 105 (!) 104 92  Resp: 12 (!) 8 18 12   Temp:  98.2 F (36.8 C) 98.2 F (36.8 C) 99 F (37.2 C)  TempSrc:  Axillary Oral Axillary  SpO2: 95% 90% 97% 97%  Weight:      Height:       Eyes: PERRL Neck: normal, supple Respiratory: clear to auscultation bilaterally, no wheezing, no crackles.  Cardiovascular: Regular rate and rhythm, no murmurs / rubs / gallops. No LE edema. Abdomen: dressing in place, C/D/I Musculoskeletal: no clubbing / cyanosis. Skin: no rashes, lesions, ulcers.  Neurologic: non focal    Data Reviewed: I have personally reviewed following labs and imaging studies  CBC:  Recent Labs Lab 12/17/15 2359 12/18/15 1510 12/19/15 0528 12/20/15 0541 12/22/15 0728 12/23/15 0500  WBC 17.1* 8.3 7.1 8.5 21.7* 19.8*  NEUTROABS 14.3*  --   --   --   --   --   HGB 13.9 12.6 10.6* 10.1* 11.2* 9.6*  HCT 41.2 38.7 33.9* 32.1* 34.4* 30.2*  MCV 93.8 96.3 96.0 96.4 95.6 95.6  PLT 506* 463* 381 366 410* 123456*   Basic Metabolic Panel:  Recent Labs Lab 12/18/15 0635 12/18/15 1510 12/19/15 0528 12/20/15 0541 12/22/15 0728 12/23/15 0500  NA 130* 130* 137 136 137 139  K 4.5 4.2 4.1 3.6 3.3* 3.1*  CL 96* 95* 104 106 110 104  CO2 23 23 26 23  17* 22  GLUCOSE 131* 119* 96 86 112* 95  BUN 12 15 13 9  <5* 5*  CREATININE 0.87 1.19* 0.88 0.76 0.76 0.77  CALCIUM 9.6 9.6 9.3 8.7* 7.9* 8.4*  MG 2.0  --   --   --   --   --    GFR: Estimated Creatinine Clearance: 47.4 mL/min (by C-G formula based on SCr of 0.77 mg/dL). Liver  Function Tests:  Recent Labs Lab 12/17/15 2359 12/18/15 1510 12/23/15 0500  AST 19 27 14*  ALT 11* 11* 9*  ALKPHOS 83 74 57  BILITOT 0.7 0.5 0.8  PROT 8.2* 7.1 4.8*  ALBUMIN 2.9* 2.7* 1.7*   No results for input(s): LIPASE, AMYLASE in the last 168 hours. No results for input(s): AMMONIA in the last 168 hours. Coagulation Profile: No results for input(s): INR, PROTIME in the last 168 hours. Cardiac Enzymes: No results for input(s): CKTOTAL, CKMB, CKMBINDEX, TROPONINI in the last 168 hours. BNP (last 3 results) No results for input(s): PROBNP in the last 8760 hours. HbA1C: No results for input(s): HGBA1C in the last 72 hours. CBG: No results for input(s): GLUCAP in the last 168 hours. Lipid Profile: No results for  input(s): CHOL, HDL, LDLCALC, TRIG, CHOLHDL, LDLDIRECT in the last 72 hours. Thyroid Function Tests: No results for input(s): TSH, T4TOTAL, FREET4, T3FREE, THYROIDAB in the last 72 hours. Anemia Panel: No results for input(s): VITAMINB12, FOLATE, FERRITIN, TIBC, IRON, RETICCTPCT in the last 72 hours. Urine analysis:    Component Value Date/Time   COLORURINE AMBER (A) 12/18/2015 1507   APPEARANCEUR CLEAR 12/18/2015 1507   LABSPEC >1.046 (H) 12/18/2015 1507   PHURINE 5.5 12/18/2015 1507   GLUCOSEU NEGATIVE 12/18/2015 1507   HGBUR NEGATIVE 12/18/2015 1507   BILIRUBINUR SMALL (A) 12/18/2015 1507   KETONESUR NEGATIVE 12/18/2015 1507   PROTEINUR NEGATIVE 12/18/2015 1507   UROBILINOGEN 0.2 08/04/2014 2100   NITRITE NEGATIVE 12/18/2015 1507   LEUKOCYTESUR NEGATIVE 12/18/2015 1507   Sepsis Labs: Invalid input(s): PROCALCITONIN, LACTICIDVEN  Recent Results (from the past 240 hour(s))  Urine culture     Status: None   Collection Time: 12/17/15  3:07 PM  Result Value Ref Range Status   Specimen Description URINE, RANDOM  Final   Special Requests NONE  Final   Culture NO GROWTH  Final   Report Status 12/19/2015 FINAL  Final  Culture, blood (Routine x 2)      Status: None (Preliminary result)   Collection Time: 12/17/15 11:55 PM  Result Value Ref Range Status   Specimen Description BLOOD RIGHT HAND  Final   Special Requests IN PEDIATRIC BOTTLE 1ML  Final   Culture NO GROWTH 4 DAYS  Final   Report Status PENDING  Incomplete  Culture, blood (Routine x 2)     Status: None (Preliminary result)   Collection Time: 12/17/15 11:59 PM  Result Value Ref Range Status   Specimen Description BLOOD RIGHT ARM  Final   Special Requests BOTTLES DRAWN AEROBIC AND ANAEROBIC 5ML  Final   Culture NO GROWTH 4 DAYS  Final   Report Status PENDING  Incomplete  MRSA PCR Screening     Status: None   Collection Time: 12/18/15  2:03 PM  Result Value Ref Range Status   MRSA by PCR NEGATIVE NEGATIVE Final    Comment:        The GeneXpert MRSA Assay (FDA approved for NASAL specimens only), is one component of a comprehensive MRSA colonization surveillance program. It is not intended to diagnose MRSA infection nor to guide or monitor treatment for MRSA infections.       Radiology Studies: Dg Chest Port 1 View  Result Date: 12/22/2015 CLINICAL DATA:  Decreased oxygen saturation. EXAM: PORTABLE CHEST 1 VIEW COMPARISON:  Chest radiographs Aug 05, 2014 and December 18, 2015 FINDINGS: There is a large paraesophageal hernia on the right, grossly stable. There is mild atelectasis in each mid lung. Lungs elsewhere clear. Heart size is upper normal with pulmonary vascularity within normal limits. There is atherosclerotic calcification in the aorta. Central catheter tip is in the superior vena cava. There are total shoulder replacements bilaterally. No pneumothorax. IMPRESSION: Large right-sided paraesophageal hernia. Areas of mild atelectasis without frank edema or consolidation. Stable cardiac silhouette. Aortic atherosclerosis. Central catheter tip in superior vena cava. Electronically Signed   By: Lowella Grip III M.D.   On: 12/22/2015 15:50     Scheduled Meds: .  ciprofloxacin  400 mg Intravenous BID  . diatrizoate meglumine-sodium  90 mL Oral Once  . enoxaparin (LOVENOX) injection  40 mg Subcutaneous Q24H  . famotidine (PEPCID) IV  20 mg Intravenous Q24H  . metronidazole  500 mg Intravenous Q8H  . morphine   Intravenous  Q4H  . timolol  1 drop Both Eyes QHS  . timolol  2 drop Both Eyes Daily   Continuous Infusions: . 0.9 % NaCl with KCl 20 mEq / L 50 mL/hr at 12/23/15 0902  . lactated ringers 50 mL/hr at 12/21/15 1010    Marzetta Board, MD, PhD [facility] Pager [phone]  If 7PM-7AM, please contact night-coverage [email or web address] Password [email or web address] 12/23/2015, 11:25 AM

## 2015-12-23 NOTE — Progress Notes (Signed)
Patient ID: Monique Russo, female   DOB: 04/23/1930, 82 y.o.   MRN: [id number]  [facility] Surgery Progress Note  2 Days Post-Op  Subjective: Continues to complain of significant abdominal pain. No flatus.  Objective: Vital signs in last 24 hours: Temp:  [97.3 F (36.3 C)-100.3 F (37.9 C)] 98.2 F (36.8 C) (10/05 0543) Pulse Rate:  [105-120] 105 (10/05 0543) Resp:  [8-24] 8 (10/05 0543) BP: (104-132)/(60-75) 114/67 (10/05 0543) SpO2:  [87 %-95 %] 90 % (10/05 0543) Last BM Date: 12/20/15  Intake/Output from previous day: 10/04 0701 - 10/05 0700 In: 2241.4 [I.V.:1541.4; IV Piggyback:700] Out: 630 [Urine:500; Drains:130] Intake/Output this shift: No intake/output data recorded.  PE: Gen:  Alert, NAD, pleasant Abd: Soft, Nd, appropriately tender, +BS, midline incision C/D/I with staples intact, drain site looks good Pulm: only pulling about 400 on IS  RLQ drain 630cc/24hr serosanguinous output  Lab Results:   Recent Labs  12/22/15 0728 12/23/15 0500  WBC 21.7* 19.8*  HGB 11.2* 9.6*  HCT 34.4* 30.2*  PLT 410* 405*   BMET  Recent Labs  12/22/15 0728 12/23/15 0500  NA 137 139  K 3.3* 3.1*  CL 110 104  CO2 17* 22  GLUCOSE 112* 95  BUN <5* 5*  CREATININE 0.76 0.77  CALCIUM 7.9* 8.4*   PT/INR No results for input(s): LABPROT, INR in the last 72 hours. CMP     Component Value Date/Time   NA 139 12/23/2015 0500   K 3.1 (L) 12/23/2015 0500   CL 104 12/23/2015 0500   CO2 22 12/23/2015 0500   GLUCOSE 95 12/23/2015 0500   BUN 5 (L) 12/23/2015 0500   CREATININE 0.77 12/23/2015 0500   CALCIUM 8.4 (L) 12/23/2015 0500   PROT 4.8 (L) 12/23/2015 0500   ALBUMIN 1.7 (L) 12/23/2015 0500   AST 14 (L) 12/23/2015 0500   ALT 9 (L) 12/23/2015 0500   ALKPHOS 57 12/23/2015 0500   BILITOT 0.8 12/23/2015 0500   GFRNONAA >60 12/23/2015 0500   GFRAA >60 12/23/2015 0500   Lipase     Component Value Date/Time   LIPASE 12 (L) 08/06/2014 2020        Studies/Results: Dg Chest Port 1 View  Result Date: 12/22/2015 CLINICAL DATA:  Decreased oxygen saturation. EXAM: PORTABLE CHEST 1 VIEW COMPARISON:  Chest radiographs Aug 05, 2014 and December 18, 2015 FINDINGS: There is a large paraesophageal hernia on the right, grossly stable. There is mild atelectasis in each mid lung. Lungs elsewhere clear. Heart size is upper normal with pulmonary vascularity within normal limits. There is atherosclerotic calcification in the aorta. Central catheter tip is in the superior vena cava. There are total shoulder replacements bilaterally. No pneumothorax. IMPRESSION: Large right-sided paraesophageal hernia. Areas of mild atelectasis without frank edema or consolidation. Stable cardiac silhouette. Aortic atherosclerosis. Central catheter tip in superior vena cava. Electronically Signed   By: Lowella Grip III M.D.   On: 12/22/2015 15:50    Anti-infectives: Anti-infectives    Start     Dose/Rate Route Frequency Ordered Stop   12/20/15 1000  ciprofloxacin (CIPRO) IVPB 400 mg     400 mg 200 mL/hr over 60 Minutes Intravenous 2 times daily 12/20/15 0907     12/20/15 0945  metroNIDAZOLE (FLAGYL) IVPB 500 mg     500 mg 100 mL/hr over 60 Minutes Intravenous Every 8 hours 12/20/15 0907     12/18/15 0430  ciprofloxacin (CIPRO) IVPB 400 mg  Status:  Discontinued     400 mg  200 mL/hr over 60 Minutes Intravenous 2 times daily 12/18/15 0427 12/20/15 0904   12/18/15 0430  metroNIDAZOLE (FLAGYL) IVPB 500 mg  Status:  Discontinued     500 mg 100 mL/hr over 60 Minutes Intravenous Every 8 hours 12/18/15 0427 12/20/15 0904   12/18/15 0345  piperacillin-tazobactam (ZOSYN) IVPB 3.375 g  Status:  Discontinued     3.375 g 100 mL/hr over 30 Minutes Intravenous  Once 12/18/15 0330 12/18/15 0427       Assessment/Plan S/p procedures: 1. Exploratory laparotomy. 2. Lysis of adhesions. 3. Removal of previously placed mesh. 4. Complex abdominal wall closure and  hernia repair with biologic mesh     (20 cm x 25 cm Phasix). -- 12/21/15 Dr. Ninfa Linden - POD 2 - unable to get NG tube placed postoperatively - +BS but no flatus. Continue NPO except ice chips  ?Diverticulitis - cipro/flagyl per internal medicine  Hyponatremia - 139 today Hypokalemia - 3.1, add K to IVF Anemia - Hg 9.6 today, continue to monitor Acute hypoxic respiratory failure - IVF rate decreased per internal medicine, CXR showed only mild atelectasis; continue IS Chronic pain GERD - IV famotidine for now, likely transition back to PPI when no longer NPO  Hiatal hernia  ID - cipro/flagyl FEN - NPO, IVF VTE - SCD, lovenox   Plan - continue NPO except ice chips. Mobilize. Encourage IS.   LOS: 5 days    Jerrye Beavers , [facility] 12/23/2015, 7:35 AM Pager: [phone] Consults: [phone] Mon-Fri 7:00 am-4:30 pm Sat-Sun 7:00 am-11:30 am

## 2015-12-23 NOTE — Care Management Important Message (Signed)
Important Message  Patient Details  Name: Monique Russo MRN: [id number] Date of Birth: [birth year]   Medicare Important Message Given:  Yes    Orbie Pyo 12/23/2015, 12:27 PM

## 2015-12-23 NOTE — Progress Notes (Signed)
PARENTERAL NUTRITION CONSULT NOTE - INITIAL  Pharmacy Consult for TPN Indication: prolonged ileus  No Known Allergies  Patient Measurements: Height: 5' (152.4 cm) Weight: 160 lb (72.6 kg) IBW/kg (Calculated) : 45.5   Vital Signs: Temp: 98.7 F (37.1 C) (10/05 1343) Temp Source: Axillary (10/05 1343) BP: 115/69 (10/05 1343) Pulse Rate: 85 (10/05 1343) Intake/Output from previous day: 10/04 0701 - 10/05 0700 In: 2241.4 [I.V.:1541.4; IV Piggyback:700] Out: 630 [Urine:500; Drains:130] Intake/Output from this shift: Total I/O In: 0  Out: 150 [Urine:150]  Labs:  Recent Labs  12/22/15 0728 12/23/15 0500  WBC 21.7* 19.8*  HGB 11.2* 9.6*  HCT 34.4* 30.2*  PLT 410* 405*     Recent Labs  12/22/15 0728 12/23/15 0500  NA 137 139  K 3.3* 3.1*  CL 110 104  CO2 17* 22  GLUCOSE 112* 95  BUN <5* 5*  CREATININE 0.76 0.77  CALCIUM 7.9* 8.4*  PROT  --  4.8*  ALBUMIN  --  1.7*  AST  --  14*  ALT  --  9*  ALKPHOS  --  57  BILITOT  --  0.8   Estimated Creatinine Clearance: 47.4 mL/min (by C-G formula based on SCr of 0.77 mg/dL).   No results for input(s): GLUCAP in the last 72 hours.  Medical History: Past Medical History:  Diagnosis Date  . Arthritis   . CHF (congestive heart failure) ([place])   . Depression   . GERD (gastroesophageal reflux disease)   . Perforated chronic gastric ulcer ([place]) 08/11/2014  . Pneumonia   . Pulmonary arterial hypertension 11/28/2014    Medications:  Prescriptions Prior to Admission  Medication Sig Dispense Refill Last Dose  . acetaminophen (TYLENOL) 325 MG tablet Take 325 mg by mouth every 6 (six) hours as needed for mild pain or headache.   12/17/2015 at Unknown time  . amoxicillin-clavulanate (AUGMENTIN) 875-125 MG tablet Take 1 tablet by mouth 2 (two) times daily.  0 12/17/2015 at Unknown time  . Ascorbic Acid (VITAMIN C) 1000 MG tablet Take 1,000 mg by mouth daily.   Past Week at Unknown time  . b complex vitamins tablet Take 1  tablet by mouth daily as needed.    Past Week at Unknown time  . bisacodyl (DULCOLAX) 10 MG suppository Place 1 suppository (10 mg total) rectally daily as needed for moderate constipation (May repeat times one). 12 suppository 0 12/17/2015 at Unknown time  . budesonide (RHINOCORT AQUA) 32 MCG/ACT nasal spray Place 1 spray into both nostrils daily as needed for rhinitis.   12/17/2015 at Unknown time  . cholecalciferol (VITAMIN D) 1000 UNITS tablet Take 1,000 Units by mouth daily.   Past Week at Unknown time  . diclofenac sodium (VOLTAREN) 1 % GEL Apply topically every three (3) days as needed (For Leg pain). 1 gm   Past Week at Unknown time  . DULoxetine (CYMBALTA) 60 MG capsule Take 60 mg by mouth daily. At bedtime   12/17/2015 at Unknown time  . Esomeprazole Magnesium (NEXIUM 24HR PO) Take 22.3 mg by mouth daily.   12/17/2015 at Unknown time  . furosemide (LASIX) 80 MG tablet Take 80 mg by mouth daily as needed for fluid.    Past Week at Unknown time  . furosemide (LASIX) 80 MG tablet Take 80 mg by mouth daily as needed for fluid.   Past Week at Unknown time  . gabapentin (NEURONTIN) 800 MG tablet Take 800 mg by mouth 2 (two) times daily.  0 12/17/2015 at Unknown  time  . lactose free nutrition (BOOST PLUS) LIQD Take 237 mLs by mouth 2 (two) times daily between meals.  0 12/17/2015 at Unknown time  . magnesium hydroxide (MILK OF MAGNESIA) 400 MG/5ML suspension Take 30 mLs by mouth every other day. As needed for constipation   Past Week at Unknown time  . methadone (DOLOPHINE) 5 MG tablet Take 5 mg by mouth every 8 (eight) hours.    12/17/2015 at Unknown time  . methocarbamol (ROBAXIN) 750 MG tablet Take 750 mg by mouth every 8 (eight) hours as needed for muscle spasms (Prescribed TID but patient takes PRN).    Past Week at Unknown time  . Multiple Vitamins-Iron (MULTIVITAMINS WITH IRON) TABS tablet Take 1 tablet by mouth daily with supper.  0 Past Week at Unknown time  . Omega-3 Fatty Acids (FISH OIL) 1200  MG CAPS Take 2 capsules by mouth daily.   Past Week at Unknown time  . oxyCODONE (ROXICODONE) 15 MG immediate release tablet Take 15 mg by mouth every 8 (eight) hours as needed for pain.  0 12/17/2015 at Unknown time  . potassium chloride SA (K-DUR,KLOR-CON) 20 MEQ tablet Take 20 mEq by mouth daily as needed (Only takes with lasix).   Past Week at Unknown time  . temazepam (RESTORIL) 30 MG capsule Take 1 capsule (30 mg total) by mouth at bedtime as needed for sleep. 30 capsule 0 Past Week at Unknown time  . timolol (BETIMOL) 0.5 % ophthalmic solution Place 1-2 drops into both eyes See admin instructions. 2 drops into both eyes in am, 1 drop into both eyes in evening   12/17/2015 at Unknown time  . vitamin E 400 UNIT capsule Take 800 Units by mouth daily.    Past Week at Unknown time    Insulin Requirements in the past 24 hours:  none  Current Nutrition:  NPO  Assessment: 82 yo F POD #2 exp lap w/ LOA, removal of previously placed mesh, complex abd wall closure and hernia repair with biologic mesh.  Unable to get NG tube placed postoperatively.  NPO x ice chips. PICC line place 10/4.  Pharmacy consulted for TPN for prolonged ileus.   Nutritional Goals: f/u w/ unit RD Lytes: K 3.1, goal > = 4 for ileus, other lytes WNL, previously had low Na,  IVF: NS 20 K at 50 ml/hr  Hepatic: alb 1.7 ID: on cipro/flagyl day 6/10 for ? Diverticulitis; WBC up to 19.8, AF, Bcx ngtd.   Plan:  Start TPN with Clinimix E 5/15 at 40 ml/hr plus lipids at 10 ml/hr to provide 48 gm protein and 1162 kcals 6 runs of K Change NS w/ 20K at 50 ml/hr to NS at kvo once TPN started tonight Add 20 mg of pepcid to TPN and stop IV pepcid bolus Add MVI and trace elements to TPN F/u with unit RD for goals Add empiric SSI TPN labs in am  Eudelia Bunch, Pharm.D. [id number] 12/23/2015 2:03 PM

## 2015-12-24 LAB — DIFFERENTIAL
BASOS ABS: 0 10*3/uL (ref 0.0–0.1)
Basophils Relative: 0 %
EOS ABS: 0.8 10*3/uL — AB (ref 0.0–0.7)
Eosinophils Relative: 5 %
LYMPHS PCT: 11 %
Lymphs Abs: 1.7 10*3/uL (ref 0.7–4.0)
MONO ABS: 1.6 10*3/uL — AB (ref 0.1–1.0)
Monocytes Relative: 10 %
Neutro Abs: 11.7 10*3/uL — ABNORMAL HIGH (ref 1.7–7.7)
Neutrophils Relative %: 74 %

## 2015-12-24 LAB — CBC
HCT: 26.5 % — ABNORMAL LOW (ref 36.0–46.0)
Hemoglobin: 8.6 g/dL — ABNORMAL LOW (ref 12.0–15.0)
MCH: 30.8 pg (ref 26.0–34.0)
MCHC: 32.5 g/dL (ref 30.0–36.0)
MCV: 95 fL (ref 78.0–100.0)
PLATELETS: 393 10*3/uL (ref 150–400)
RBC: 2.79 MIL/uL — ABNORMAL LOW (ref 3.87–5.11)
RDW: 14.9 % (ref 11.5–15.5)
WBC: 15.8 10*3/uL — AB (ref 4.0–10.5)

## 2015-12-24 LAB — COMPREHENSIVE METABOLIC PANEL
ALT: 9 U/L — ABNORMAL LOW (ref 14–54)
ANION GAP: 8 (ref 5–15)
AST: 14 U/L — AB (ref 15–41)
Albumin: 1.6 g/dL — ABNORMAL LOW (ref 3.5–5.0)
Alkaline Phosphatase: 44 U/L (ref 38–126)
BUN: 6 mg/dL (ref 6–20)
CHLORIDE: 105 mmol/L (ref 101–111)
CO2: 26 mmol/L (ref 22–32)
Calcium: 8.7 mg/dL — ABNORMAL LOW (ref 8.9–10.3)
Creatinine, Ser: 0.6 mg/dL (ref 0.44–1.00)
Glucose, Bld: 147 mg/dL — ABNORMAL HIGH (ref 65–99)
POTASSIUM: 3.6 mmol/L (ref 3.5–5.1)
Sodium: 139 mmol/L (ref 135–145)
Total Bilirubin: 0.4 mg/dL (ref 0.3–1.2)
Total Protein: 4.8 g/dL — ABNORMAL LOW (ref 6.5–8.1)

## 2015-12-24 LAB — GLUCOSE, CAPILLARY
GLUCOSE-CAPILLARY: 113 mg/dL — AB (ref 65–99)
GLUCOSE-CAPILLARY: 125 mg/dL — AB (ref 65–99)
Glucose-Capillary: 108 mg/dL — ABNORMAL HIGH (ref 65–99)
Glucose-Capillary: 148 mg/dL — ABNORMAL HIGH (ref 65–99)
Glucose-Capillary: 149 mg/dL — ABNORMAL HIGH (ref 65–99)
Glucose-Capillary: 149 mg/dL — ABNORMAL HIGH (ref 65–99)

## 2015-12-24 LAB — TRIGLYCERIDES: TRIGLYCERIDES: 64 mg/dL (ref <150)

## 2015-12-24 LAB — MAGNESIUM: MAGNESIUM: 1.7 mg/dL (ref 1.7–2.4)

## 2015-12-24 LAB — PHOSPHORUS: PHOSPHORUS: 1 mg/dL — AB (ref 2.5–4.6)

## 2015-12-24 LAB — PREALBUMIN

## 2015-12-24 MED ORDER — FAT EMULSION 20 % IV EMUL
240.0000 mL | INTRAVENOUS | Status: AC
  Administered 2015-12-24: 240 mL via INTRAVENOUS
  Filled 2015-12-24: qty 250

## 2015-12-24 MED ORDER — TRACE MINERALS CR-CU-MN-SE-ZN 10-1000-500-60 MCG/ML IV SOLN
INTRAVENOUS | Status: AC
  Administered 2015-12-24: 17:00:00 via INTRAVENOUS
  Filled 2015-12-24: qty 960

## 2015-12-24 MED ORDER — MAGNESIUM SULFATE 2 GM/50ML IV SOLN
2.0000 g | Freq: Once | INTRAVENOUS | Status: AC
Start: 2015-12-24 — End: 2015-12-24
  Administered 2015-12-24: 2 g via INTRAVENOUS
  Filled 2015-12-24: qty 50

## 2015-12-24 MED ORDER — HYDROMORPHONE HCL 1 MG/ML IJ SOLN
0.5000 mg | INTRAMUSCULAR | Status: DC | PRN
  Administered 2015-12-25 – 2015-12-26 (×9): 0.5 mg via INTRAVENOUS
  Filled 2015-12-24 (×11): qty 1

## 2015-12-24 MED ORDER — SODIUM CHLORIDE 0.9% FLUSH
10.0000 mL | INTRAVENOUS | Status: DC | PRN
  Administered 2015-12-27: 10 mL via INTRAVENOUS
  Administered 2015-12-29: 30 mL via INTRAVENOUS
  Administered 2015-12-31 – 2016-01-05 (×2): 10 mL via INTRAVENOUS
  Filled 2015-12-24 (×4): qty 10

## 2015-12-24 MED ORDER — SODIUM PHOSPHATES 45 MMOLE/15ML IV SOLN
30.0000 mmol | Freq: Once | INTRAVENOUS | Status: AC
  Administered 2015-12-24: 30 mmol via INTRAVENOUS
  Filled 2015-12-24: qty 10

## 2015-12-24 MED ORDER — HYDROMORPHONE HCL 1 MG/ML IJ SOLN
1.0000 mg | INTRAMUSCULAR | Status: DC | PRN
  Administered 2015-12-24 – 2015-12-25 (×5): 1 mg via INTRAVENOUS
  Filled 2015-12-24 (×5): qty 1

## 2015-12-24 MED ORDER — METHOCARBAMOL 1000 MG/10ML IJ SOLN
500.0000 mg | Freq: Four times a day (QID) | INTRAVENOUS | Status: DC | PRN
  Administered 2015-12-24 – 2015-12-28 (×7): 500 mg via INTRAVENOUS
  Filled 2015-12-24 (×10): qty 5

## 2015-12-24 MED ORDER — KETOROLAC TROMETHAMINE 15 MG/ML IJ SOLN
7.5000 mg | Freq: Three times a day (TID) | INTRAMUSCULAR | Status: AC | PRN
  Administered 2015-12-24 – 2015-12-29 (×9): 7.5 mg via INTRAVENOUS
  Filled 2015-12-24 (×9): qty 1

## 2015-12-24 MED ORDER — POTASSIUM CHLORIDE 10 MEQ/50ML IV SOLN
10.0000 meq | INTRAVENOUS | Status: AC
  Administered 2015-12-24 (×4): 10 meq via INTRAVENOUS
  Filled 2015-12-24 (×4): qty 50

## 2015-12-24 NOTE — Progress Notes (Signed)
[facility] Progress Note  3 Days Post-Op  Subjective: Laying in bed, moaning in pain. Denies flatus or BM. Not using IS or getting out of bed secondary to pain.  Objective: Vital signs in last 24 hours: Temp:  [98.1 F (36.7 C)-99 F (37.2 C)] 98.2 F (36.8 C) (10/06 0600) Pulse Rate:  [84-104] 95 (10/06 0600) Resp:  [12-19] 17 (10/06 0600) BP: (100-126)/(56-95) 105/59 (10/06 0600) SpO2:  [95 %-99 %] 98 % (10/06 0600) Last BM Date: 12/20/15  Intake/Output from previous day: 10/05 0701 - 10/06 0700 In: 1300.8 [I.V.:300.8; IV Piggyback:1000] Out: 885 [Urine:800; Drains:85] Intake/Output this shift: No intake/output data recorded.  PE: Gen:  Alert, in obvious abdominal discomfort  HEENT: Nasal cannula  Card:  RRR, no M/G/R appreciated Pulm:  Some upper airway congestion, CTABL, decreased lung sounds at bases Abd: Soft, tender, nondistended, +BS, dressing c/d/i.  Lab Results:   Recent Labs  12/23/15 0500 12/24/15 0500  WBC 19.8* 15.8*  HGB 9.6* 8.6*  HCT 30.2* 26.5*  PLT 405* 393   BMET  Recent Labs  12/23/15 0500 12/24/15 0500  NA 139 139  K 3.1* 3.6  CL 104 105  CO2 22 26  GLUCOSE 95 147*  BUN 5* 6  CREATININE 0.77 0.60  CALCIUM 8.4* 8.7*   CMP     Component Value Date/Time   NA 139 12/24/2015 0500   K 3.6 12/24/2015 0500   CL 105 12/24/2015 0500   CO2 26 12/24/2015 0500   GLUCOSE 147 (H) 12/24/2015 0500   BUN 6 12/24/2015 0500   CREATININE 0.60 12/24/2015 0500   CALCIUM 8.7 (L) 12/24/2015 0500   PROT 4.8 (L) 12/24/2015 0500   ALBUMIN 1.6 (L) 12/24/2015 0500   AST 14 (L) 12/24/2015 0500   ALT 9 (L) 12/24/2015 0500   ALKPHOS 44 12/24/2015 0500   BILITOT 0.4 12/24/2015 0500   GFRNONAA >60 12/24/2015 0500   GFRAA >60 12/24/2015 0500   Lipase     Component Value Date/Time   LIPASE 12 (L) 08/06/2014 2020   Studies/Results: Dg Chest Port 1 View  Result Date: 12/22/2015 CLINICAL DATA:  Decreased oxygen saturation. EXAM:  PORTABLE CHEST 1 VIEW COMPARISON:  Chest radiographs Aug 05, 2014 and December 18, 2015 FINDINGS: There is a large paraesophageal hernia on the right, grossly stable. There is mild atelectasis in each mid lung. Lungs elsewhere clear. Heart size is upper normal with pulmonary vascularity within normal limits. There is atherosclerotic calcification in the aorta. Central catheter tip is in the superior vena cava. There are total shoulder replacements bilaterally. No pneumothorax. IMPRESSION: Large right-sided paraesophageal hernia. Areas of mild atelectasis without frank edema or consolidation. Stable cardiac silhouette. Aortic atherosclerosis. Central catheter tip in superior vena cava. Electronically Signed   By: Lowella Grip III M.D.   On: 12/22/2015 15:50    Anti-infectives: Anti-infectives    Start     Dose/Rate Route Frequency Ordered Stop   12/20/15 1000  ciprofloxacin (CIPRO) IVPB 400 mg     400 mg 200 mL/hr over 60 Minutes Intravenous 2 times daily 12/20/15 0907     12/20/15 0945  metroNIDAZOLE (FLAGYL) IVPB 500 mg     500 mg 100 mL/hr over 60 Minutes Intravenous Every 8 hours 12/20/15 0907     12/18/15 0430  ciprofloxacin (CIPRO) IVPB 400 mg  Status:  Discontinued     400 mg 200 mL/hr over 60 Minutes Intravenous 2 times daily 12/18/15 0427 12/20/15 0904   12/18/15 0430  metroNIDAZOLE (  FLAGYL) IVPB 500 mg  Status:  Discontinued     500 mg 100 mL/hr over 60 Minutes Intravenous Every 8 hours 12/18/15 0427 12/20/15 0904   12/18/15 0345  piperacillin-tazobactam (ZOSYN) IVPB 3.375 g  Status:  Discontinued     3.375 g 100 mL/hr over 30 Minutes Intravenous  Once 12/18/15 0330 12/18/15 0427     Assessment/Plan S/p procedures: Exploratory laparotomy. Lysis of adhesions. Removal of previously placed mesh. Complex abdominal wall closure and hernia repair with biologic mesh (20 cm x 25 cm Phasix). -- 12/21/15 Dr. Ninfa Linden - POD 3 - unable to get NG tube placed postoperatively - +BS  but no flatus. Continue NPO except ice chips  Diverticulitis - cipro/flagyl per internal medicine  Hyponatremia - 139 today Hypokalemia - 3.1, add K to IVF Anemia - Hg 9.6 today, continue to monitor Acute hypoxic respiratory failure - IVF rate decreased per internal medicine, CXR showed only mild atelectasis; continue IS Chronic pain GERD - IV famotidine for now, likely transition back to PPI when no longer NPO  Hiatal hernia  ID - cipro/flagyl FEN - NPO, IVF VTE - SCD, lovenox   Plan - not using PCA regularly, pain not controlled. Will DC PCA and start PRN IV Dilaudid and Robaxin.  Continue to encourage IS and ambulation.    LOS: 6 days    [facility] 12/24/2015, 7:41 AM Pager: [phone] Consults: [phone] Mon-Fri 7:00 am-4:30 pm Sat-Sun 7:00 am-11:30 am

## 2015-12-24 NOTE — Progress Notes (Signed)
PT Cancellation Note  Patient Details Name: Monique Russo MRN: [id number] DOB: 1930-09-20   Cancelled Treatment:    Reason Eval/Treat Not Completed: Fatigue/lethargy limiting ability to participate;Pain limiting ability to participate.  Pt adamantly refusing PT and any mobility despite encouragement.     Catarina Hartshorn, [place] 12/24/2015, 8:55 AM

## 2015-12-24 NOTE — Progress Notes (Signed)
CRITICAL VALUE ALERT  Critical value received: Phosphorus 1.0  Date of notification:  12/24/15  Time of notification:  [id number]  Critical value read back:Yes.    Nurse who received alert:  Junius Argyle, RN  MD notified (1st page):  Dr. Barry Dienes  Time of first page:  94  MD notified (2nd page):  Time of second page:  Responding MD:  Dr. Barry Dienes  Time MD responded:  [phone]

## 2015-12-24 NOTE — Progress Notes (Signed)
PROGRESS NOTE  Monique Russo [id number] DOB: April 12, 1930 DOA: 12/17/2015 PCP: Horatio Pel, MD   LOS: 6 days   Brief Narrative: Monique Russo a 82 y.o.femalewith a past medical history significant for chronic pain on methadone and chronic non-healing surgical woundwho presented with abdominal pain and vomiting for 3 days. The patient had a perforated ulcer 1 year ago requiring [name] patch.This surgery was unfortunately complicated by nonhealing surgical wound (because of 80 year old infected hernia repair mesh?), Which for the last year the patient has been having wound care by her general surgeon in the office every six weeks (per family). This week, two days prior to admission, the patient had an office debridement and described that Dr. Excell Seltzer did his usual debridement, after which she began to develop nausea and LLQ abdominal pain. This crampy, severe, LLQ aching abdominal pain continued intermittently and was associated with several episodes of nonbloody emesis and obstipation. On the morning of admission, she was recommended by surgery office for ER evaluation. CT of the abdomen and pelvis was obtained that showed small bowel obstruction with transition point in the right mid abdomenand stranding around the colon, consistent with diverticulitis. Surgery was consulted and patient was admitted for further workup. Patient's condition did not improve and she underwent ExLap, LOA, removal of prior mesh and complex abdominal wall closure and hernia repair with biologic mesh on 10/3.  Assessment & Plan: Principal Problem:   Small bowel obstruction Active Problems:   Hyponatremia   Chronic pain syndrome   Diverticulitis of large intestine without perforation or abscess without bleeding   SBO (small bowel obstruction)    Small bowel obstruction - Per patient, had a BM 10/2, however abdominal x-ray 10/3 showed persistent high-grade small bowel obstruction - general  surgery, following, patient underwent ExLap, LOA, removal of prior mesh and complex abdominal wall closure and hernia repair with biologic mesh on 10/3. - continue NPO - on TPN now - prn dilaudid for pain. Off of PCA this morning per surgery   Diverticulitis - There is stranding around the colon that suggests diverticulitis. - continue Ciprofloxacin and Flagyl IV, today day 7/10  Hyponatremia - Improving - on TPN  Chronic pain syndrome - Hold methadone and hydrocodone while NPO - dilaudid prn  GERD and hiatal hernia - Replace PPI with IV famotidine while NPO  - DG barium esophagus showed marked lower esophageal tortuosity with large hiatal hernia but no mass. For definitive imaging, patient will need EGD but this can be done at a later date  Acute hypoxic respiratory failure - post op requiring 4L [place] - CXR without overt pulmonary edema 10/4 - closely monitor respiratory status    DVT prophylaxis: SCD Code Status: Full Family Communication: d/w sister at bedside Disposition Plan: remain inpatient  Consultants:   General surgery  Procedures:   ExLap, LOA, removal of prior mesh and complex abdominal wall closure and hernia repair with biologic mesh on 10/3.  Foley: yes  Antimicrobials:  Ciprofloxacin 9/30 >>  Flagyl 9/30 >>   Subjective: - in pain.  Objective: Vitals:   12/24/15 0109 12/24/15 0205 12/24/15 0407 12/24/15 0600  BP:  118/65  (!) 105/59  Pulse:  (!) 104  95  Resp: 18 18 16 17   Temp:  98.2 F (36.8 C)  98.2 F (36.8 C)  TempSrc:  Oral  Oral  SpO2: 95% 98% 97% 98%  Weight:      Height:        Intake/Output Summary (Last 24 hours)  at 12/24/15 1253 Last data filed at 12/24/15 1058  Gross per 24 hour  Intake          1300.84 ml  Output              770 ml  Net           530.84 ml   Filed Weights   12/17/15 2249 12/18/15 1238  Weight: 73 kg (161 lb) 72.6 kg (160 lb)    Examination: Constitutional: in pain,appears  distressed Vitals:   12/24/15 0109 12/24/15 0205 12/24/15 0407 12/24/15 0600  BP:  118/65  (!) 105/59  Pulse:  (!) 104  95  Resp: 18 18 16 17   Temp:  98.2 F (36.8 C)  98.2 F (36.8 C)  TempSrc:  Oral  Oral  SpO2: 95% 98% 97% 98%  Weight:      Height:       Eyes: PERRL Neck: normal, supple Respiratory: clear to auscultation bilaterally, no wheezing, no crackles.  Cardiovascular: Regular rate and rhythm, no murmurs / rubs / gallops. No LE edema. Abdomen: dressing in place, C/D/I Musculoskeletal: no clubbing / cyanosis. Neurologic: non focal    Data Reviewed: I have personally reviewed following labs and imaging studies  CBC:  Recent Labs Lab 12/17/15 2359  12/19/15 0528 12/20/15 0541 12/22/15 0728 12/23/15 0500 12/24/15 0500  WBC 17.1*  < > 7.1 8.5 21.7* 19.8* 15.8*  NEUTROABS 14.3*  --   --   --   --   --  11.7*  HGB 13.9  < > 10.6* 10.1* 11.2* 9.6* 8.6*  HCT 41.2  < > 33.9* 32.1* 34.4* 30.2* 26.5*  MCV 93.8  < > 96.0 96.4 95.6 95.6 95.0  PLT 506*  < > 381 366 410* 405* 393  < > = values in this interval not displayed. Basic Metabolic Panel:  Recent Labs Lab 12/18/15 0635  12/19/15 0528 12/20/15 0541 12/22/15 0728 12/23/15 0500 12/24/15 0500  NA 130*  < > 137 136 137 139 139  K 4.5  < > 4.1 3.6 3.3* 3.1* 3.6  CL 96*  < > 104 106 110 104 105  CO2 23  < > 26 23 17* 22 26  GLUCOSE 131*  < > 96 86 112* 95 147*  BUN 12  < > 13 9 <5* 5* 6  CREATININE 0.87  < > 0.88 0.76 0.76 0.77 0.60  CALCIUM 9.6  < > 9.3 8.7* 7.9* 8.4* 8.7*  MG 2.0  --   --   --   --   --  1.7  PHOS  --   --   --   --   --   --  1.0*  < > = values in this interval not displayed. GFR: Estimated Creatinine Clearance: 47.4 mL/min (by C-G formula based on SCr of 0.6 mg/dL). Liver Function Tests:  Recent Labs Lab 12/17/15 2359 12/18/15 1510 12/23/15 0500 12/24/15 0500  AST 19 27 14* 14*  ALT 11* 11* 9* 9*  ALKPHOS 83 74 57 44  BILITOT 0.7 0.5 0.8 0.4  PROT 8.2* 7.1 4.8* 4.8*   ALBUMIN 2.9* 2.7* 1.7* 1.6*   No results for input(s): LIPASE, AMYLASE in the last 168 hours. No results for input(s): AMMONIA in the last 168 hours. Coagulation Profile: No results for input(s): INR, PROTIME in the last 168 hours. Cardiac Enzymes: No results for input(s): CKTOTAL, CKMB, CKMBINDEX, TROPONINI in the last 168 hours. BNP (last 3 results) No results for input(s): PROBNP  in the last 8760 hours. HbA1C: No results for input(s): HGBA1C in the last 72 hours. CBG:  Recent Labs Lab 12/24/15 0009 12/24/15 0024 12/24/15 0516 12/24/15 1200  GLUCAP 113* 125* 148* 149*   Lipid Profile:  Recent Labs  12/24/15 0500  TRIG 64   Thyroid Function Tests: No results for input(s): TSH, T4TOTAL, FREET4, T3FREE, THYROIDAB in the last 72 hours. Anemia Panel: No results for input(s): VITAMINB12, FOLATE, FERRITIN, TIBC, IRON, RETICCTPCT in the last 72 hours. Urine analysis:    Component Value Date/Time   COLORURINE AMBER (A) 12/18/2015 1507   APPEARANCEUR CLEAR 12/18/2015 1507   LABSPEC >1.046 (H) 12/18/2015 1507   PHURINE 5.5 12/18/2015 1507   GLUCOSEU NEGATIVE 12/18/2015 1507   HGBUR NEGATIVE 12/18/2015 1507   BILIRUBINUR SMALL (A) 12/18/2015 1507   KETONESUR NEGATIVE 12/18/2015 1507   PROTEINUR NEGATIVE 12/18/2015 1507   UROBILINOGEN 0.2 08/04/2014 2100   NITRITE NEGATIVE 12/18/2015 1507   LEUKOCYTESUR NEGATIVE 12/18/2015 1507   Sepsis Labs: Invalid input(s): PROCALCITONIN, LACTICIDVEN  Recent Results (from the past 240 hour(s))  Urine culture     Status: None   Collection Time: 12/17/15  3:07 PM  Result Value Ref Range Status   Specimen Description URINE, RANDOM  Final   Special Requests NONE  Final   Culture NO GROWTH  Final   Report Status 12/19/2015 FINAL  Final  Culture, blood (Routine x 2)     Status: None   Collection Time: 12/17/15 11:55 PM  Result Value Ref Range Status   Specimen Description BLOOD RIGHT HAND  Final   Special Requests IN PEDIATRIC  BOTTLE 1ML  Final   Culture NO GROWTH 5 DAYS  Final   Report Status 12/23/2015 FINAL  Final  Culture, blood (Routine x 2)     Status: None   Collection Time: 12/17/15 11:59 PM  Result Value Ref Range Status   Specimen Description BLOOD RIGHT ARM  Final   Special Requests BOTTLES DRAWN AEROBIC AND ANAEROBIC 5ML  Final   Culture NO GROWTH 5 DAYS  Final   Report Status 12/23/2015 FINAL  Final  MRSA PCR Screening     Status: None   Collection Time: 12/18/15  2:03 PM  Result Value Ref Range Status   MRSA by PCR NEGATIVE NEGATIVE Final    Comment:        The GeneXpert MRSA Assay (FDA approved for NASAL specimens only), is one component of a comprehensive MRSA colonization surveillance program. It is not intended to diagnose MRSA infection nor to guide or monitor treatment for MRSA infections.       Radiology Studies: Dg Chest Port 1 View  Result Date: 12/22/2015 CLINICAL DATA:  Decreased oxygen saturation. EXAM: PORTABLE CHEST 1 VIEW COMPARISON:  Chest radiographs Aug 05, 2014 and December 18, 2015 FINDINGS: There is a large paraesophageal hernia on the right, grossly stable. There is mild atelectasis in each mid lung. Lungs elsewhere clear. Heart size is upper normal with pulmonary vascularity within normal limits. There is atherosclerotic calcification in the aorta. Central catheter tip is in the superior vena cava. There are total shoulder replacements bilaterally. No pneumothorax. IMPRESSION: Large right-sided paraesophageal hernia. Areas of mild atelectasis without frank edema or consolidation. Stable cardiac silhouette. Aortic atherosclerosis. Central catheter tip in superior vena cava. Electronically Signed   By: Lowella Grip III M.D.   On: 12/22/2015 15:50     Scheduled Meds: . ciprofloxacin  400 mg Intravenous BID  . enoxaparin (LOVENOX) injection  40 mg  Subcutaneous Q24H  . insulin aspart  0-9 Units Subcutaneous Q6H  . magnesium sulfate 1 - 4 g bolus IVPB  2 g  Intravenous Once  . metronidazole  500 mg Intravenous Q8H  . potassium chloride  10 mEq Intravenous Q1 Hr x 4  . sodium phosphate  Dextrose 5% IVPB  30 mmol Intravenous Once  . timolol  1 drop Both Eyes QHS  . timolol  2 drop Both Eyes Daily   Continuous Infusions: . Marland KitchenTPN (CLINIMIX-E) Adult 40 mL/hr at 12/23/15 1811   And  . fat emulsion 240 mL (12/23/15 1811)  . Marland KitchenTPN (CLINIMIX-E) Adult     And  . fat emulsion      Marzetta Board, MD, PhD [facility] Pager [phone]  If 7PM-7AM, please contact night-coverage [email or web address] Password [email or web address] 12/24/2015, 12:53 PM

## 2015-12-24 NOTE — Progress Notes (Signed)
PARENTERAL NUTRITION CONSULT NOTE - FOLLOW UP  Pharmacy Consult: TPN Indication:  Prolonged ileus  No Known Allergies  Patient Measurements: Height: 5' (152.4 cm) Weight: 160 lb (72.6 kg) IBW/kg (Calculated) : 45.5  Vital Signs: Temp: 98.2 F (36.8 C) (10/06 0600) Temp Source: Oral (10/06 0600) BP: 105/59 (10/06 0600) Pulse Rate: 95 (10/06 0600) Intake/Output from previous day: 10/05 0701 - 10/06 0700 In: 1300.8 [I.V.:300.8; IV Piggyback:1000] Out: 885 [Urine:800; Drains:85] Intake/Output from this shift: No intake/output data recorded.  Labs:  Recent Labs  12/22/15 0728 12/23/15 0500 12/24/15 0500  WBC 21.7* 19.8* 15.8*  HGB 11.2* 9.6* 8.6*  HCT 34.4* 30.2* 26.5*  PLT 410* 405* 393     Recent Labs  12/22/15 0728 12/23/15 0500 12/24/15 0500  NA 137 139 139  K 3.3* 3.1* 3.6  CL 110 104 105  CO2 17* 22 26  GLUCOSE 112* 95 147*  BUN <5* 5* 6  CREATININE 0.76 0.77 0.60  CALCIUM 7.9* 8.4* 8.7*  MG  --   --  1.7  PHOS  --   --  1.0*  PROT  --  4.8* 4.8*  ALBUMIN  --  1.7* 1.6*  AST  --  14* 14*  ALT  --  9* 9*  ALKPHOS  --  57 44  BILITOT  --  0.8 0.4  PREALBUMIN  --   --  <5*  TRIG  --   --  64   Estimated Creatinine Clearance: 47.4 mL/min (by C-G formula based on SCr of 0.6 mg/dL).    Recent Labs  12/24/15 0024 12/24/15 0516  GLUCAP 125* 148*     Insulin Requirements in the past 24 hours:  2 units sensitive SSI  Assessment: 24 YOF s/p ex-lap with LOA, removal of previously placed mesh, complex abdominal wall closure and hernia repair with biologic mesh.  She has been NPO x5 day since admission and >7 days per patient report.  Pharmacy consulted for TPN for prolonged ileus.  Per documentation, patient lost 10 lbs over the past week PTA and was eating minimally since 12/15/15.  She is at risk for refeeding.  GI: hx GERD - Pepcid in TPN.  Baseline prealbumin <5.  Drain O/P 70mL Endo: no hx DM - CBGs currently controlled Lytes: c/w refeeding -  K+ 3.6 (goal >/= 4 for ileus), Mag 1.7 (goal >/= 2 for ileus), Phos 1 (30 mmol NaPhos given), CoCa elevated at 10.62 Renal: SCr 0.6 stable, CrCL 48 ml/min, BUN WNL - good UOP 0.5 ml/kg/hr, NS at 10 ml/hr Pulm: satting well on 5L [place] Cards: PAH / CHF - BP soft, tachy improving (PTA Lasix, Lovaza on hold) Hepatobil: LFTs / tbili / TG WNL Neuro: depression - PTA Cymbalta, methadone, gabapentin on hold. Pain score 6-10, PRN Dilaudid available, off morphine PCA ID: Cipro/Flagyl (9/30 >> ) for diverticulitis - afebrile, WBC improving Best Practices: Lovenox TPN Access: PICC placed 12/22/15 TPN start date: 12/23/15  Current Nutrition:  TPN  Nutritional Goals: 1400-1600 kCal and 65-80 gm protein per day   Plan:  - Continue Clinimix E 5/15 at 40 ml/hr (goal 65 ml/hr) and lipids at 10 ml/hr.  Advance TPN rate once refeeding risk is minimized. - Daily multivitamin and trace elements in TPN - Pepcid 20mg  daily in TPN - KCL x 4 runs - Mag sulfate 2gm IV x 1 - Continue sensitive SSI Q6H.  D/C if CBGs remain controlled at goal TPN rate. - D/C NS at 10 ml/hr given national shortage.  Order saline flushes PRN. - F/U AM labs, pain control, abx LOT   Alazae Crymes D. Mina Marble, PharmD, BCPS Pager:  [phone] 12/24/2015, 8:37 AM

## 2015-12-24 NOTE — Progress Notes (Signed)
Nursing staff able to get pt. Into chair. Pt. Tolerated sitting in the chair for a couple of hours.

## 2015-12-25 ENCOUNTER — Inpatient Hospital Stay (HOSPITAL_COMMUNITY): Payer: PPO

## 2015-12-25 LAB — COMPREHENSIVE METABOLIC PANEL
ALK PHOS: 41 U/L (ref 38–126)
ALT: 7 U/L — AB (ref 14–54)
AST: 11 U/L — ABNORMAL LOW (ref 15–41)
Albumin: 1.6 g/dL — ABNORMAL LOW (ref 3.5–5.0)
Anion gap: 5 (ref 5–15)
BUN: 8 mg/dL (ref 6–20)
CALCIUM: 8.6 mg/dL — AB (ref 8.9–10.3)
CO2: 25 mmol/L (ref 22–32)
CREATININE: 0.57 mg/dL (ref 0.44–1.00)
Chloride: 108 mmol/L (ref 101–111)
Glucose, Bld: 125 mg/dL — ABNORMAL HIGH (ref 65–99)
Potassium: 3.8 mmol/L (ref 3.5–5.1)
Sodium: 138 mmol/L (ref 135–145)
Total Bilirubin: 0.3 mg/dL (ref 0.3–1.2)
Total Protein: 4.9 g/dL — ABNORMAL LOW (ref 6.5–8.1)

## 2015-12-25 LAB — CBC
HCT: 27 % — ABNORMAL LOW (ref 36.0–46.0)
Hemoglobin: 8.6 g/dL — ABNORMAL LOW (ref 12.0–15.0)
MCH: 30.3 pg (ref 26.0–34.0)
MCHC: 31.9 g/dL (ref 30.0–36.0)
MCV: 95.1 fL (ref 78.0–100.0)
PLATELETS: 394 10*3/uL (ref 150–400)
RBC: 2.84 MIL/uL — AB (ref 3.87–5.11)
RDW: 15 % (ref 11.5–15.5)
WBC: 14.8 10*3/uL — AB (ref 4.0–10.5)

## 2015-12-25 LAB — GLUCOSE, CAPILLARY
GLUCOSE-CAPILLARY: 150 mg/dL — AB (ref 65–99)
GLUCOSE-CAPILLARY: 158 mg/dL — AB (ref 65–99)
GLUCOSE-CAPILLARY: 141 mg/dL — AB (ref 65–99)
Glucose-Capillary: 155 mg/dL — ABNORMAL HIGH (ref 65–99)

## 2015-12-25 LAB — MAGNESIUM: MAGNESIUM: 2 mg/dL (ref 1.7–2.4)

## 2015-12-25 LAB — PHOSPHORUS: Phosphorus: 2.9 mg/dL (ref 2.5–4.6)

## 2015-12-25 MED ORDER — DEXTROSE 5 % IV SOLN
10.0000 mmol | Freq: Once | INTRAVENOUS | Status: AC
  Administered 2015-12-25: 10 mmol via INTRAVENOUS
  Filled 2015-12-25: qty 3.33

## 2015-12-25 MED ORDER — POTASSIUM CHLORIDE 10 MEQ/50ML IV SOLN
10.0000 meq | INTRAVENOUS | Status: AC
  Administered 2015-12-25 (×2): 10 meq via INTRAVENOUS
  Filled 2015-12-25 (×2): qty 50

## 2015-12-25 MED ORDER — TRACE MINERALS CR-CU-MN-SE-ZN 10-1000-500-60 MCG/ML IV SOLN
INTRAVENOUS | Status: AC
  Administered 2015-12-25: 18:00:00 via INTRAVENOUS
  Filled 2015-12-25: qty 1560

## 2015-12-25 MED ORDER — MAGNESIUM SULFATE IN D5W 1-5 GM/100ML-% IV SOLN
1.0000 g | Freq: Once | INTRAVENOUS | Status: AC
  Administered 2015-12-25: 1 g via INTRAVENOUS
  Filled 2015-12-25: qty 100

## 2015-12-25 MED ORDER — LORAZEPAM 2 MG/ML IJ SOLN
1.0000 mg | Freq: Three times a day (TID) | INTRAMUSCULAR | Status: DC | PRN
  Administered 2015-12-25 – 2015-12-29 (×6): 1 mg via INTRAVENOUS
  Filled 2015-12-25 (×6): qty 1

## 2015-12-25 MED ORDER — PIPERACILLIN-TAZOBACTAM 3.375 G IVPB
3.3750 g | Freq: Three times a day (TID) | INTRAVENOUS | Status: DC
  Administered 2015-12-25 – 2015-12-29 (×12): 3.375 g via INTRAVENOUS
  Filled 2015-12-25 (×14): qty 50

## 2015-12-25 MED ORDER — FAT EMULSION 20 % IV EMUL
240.0000 mL | INTRAVENOUS | Status: AC
  Administered 2015-12-25: 240 mL via INTRAVENOUS
  Filled 2015-12-25: qty 250

## 2015-12-25 NOTE — Progress Notes (Signed)
PROGRESS NOTE  Monique Russo [id number] DOB: 11/07/1930 DOA: 12/17/2015 PCP: Horatio Pel, MD   LOS: 7 days   Brief Narrative: Monique Russo a 82 y.o.femalewith a past medical history significant for chronic pain on methadone and chronic non-healing surgical woundwho presented with abdominal pain and vomiting for 3 days. The patient had a perforated ulcer 1 year ago requiring [name] patch.This surgery was unfortunately complicated by nonhealing surgical wound (because of 80 year old infected hernia repair mesh?), Which for the last year the patient has been having wound care by her general surgeon in the office every six weeks (per family). This week, two days prior to admission, the patient had an office debridement and described that Dr. Excell Seltzer did his usual debridement, after which she began to develop nausea and LLQ abdominal pain. This crampy, severe, LLQ aching abdominal pain continued intermittently and was associated with several episodes of nonbloody emesis and obstipation. On the morning of admission, she was recommended by surgery office for ER evaluation. CT of the abdomen and pelvis was obtained that showed small bowel obstruction with transition point in the right mid abdomenand stranding around the colon, consistent with diverticulitis. Surgery was consulted and patient was admitted for further workup. Patient's condition did not improve and she underwent ExLap, LOA, removal of prior mesh and complex abdominal wall closure and hernia repair with biologic mesh on 10/3.  Assessment & Plan: Principal Problem:   Small bowel obstruction Active Problems:   Hyponatremia   Chronic pain syndrome   Diverticulitis of large intestine without perforation or abscess without bleeding   SBO (small bowel obstruction)   Small bowel obstruction - Per patient, had a BM 10/2, however abdominal x-ray 10/3 showed persistent high-grade small bowel obstruction - general  surgery, following, patient underwent ExLap, LOA, removal of prior mesh and complex abdominal wall closure and hernia repair with biologic mesh on 10/3. - quite sleepy this morning, noticed surgery decreased her narcotics. Agree - had a BM today, start clears with caution. If she tolerates, will start home pain regimen   Diverticulitis - There is stranding around the colon that suggests diverticulitis. - was on Ciprofloxacin and Flagyl IV for 8 days. Today will switch to Zosyn for aspiration PNA  Aspiration PNA vs HCAP - SLP evaluation ordered, discussed at length with daughter, she understands aspiration risk - CXR with developing PNA - start Zosyn which will have good intra-abdominal coverage as well - patient complains of productive cough over the past day  Hyponatremia - Improving - on TPN  Chronic pain syndrome - Hold methadone and hydrocodone while NPO, resume if tolerating clears - dilaudid prn  GERD and hiatal hernia - Replace PPI with IV famotidine while NPO  - DG barium esophagus showed marked lower esophageal tortuosity with large hiatal hernia but no mass. For definitive imaging, patient will need EGD but this can be done at a later date. This may contribute to her aspiration / reflux, discussed with daughter at bedside today   Acute hypoxic respiratory failure - post op requiring 4L [place] - CXR without overt pulmonary edema 10/4 however developing infiltrate 10/7. Abx as above - closely monitor respiratory status    DVT prophylaxis: SCD Code Status: Full Family Communication: d/w daughter at bedside Disposition Plan: remain inpatient  Consultants:   General surgery  Procedures:   ExLap, LOA, removal of prior mesh and complex abdominal wall closure and hernia repair with biologic mesh on 10/3.  Foley: yes  Antimicrobials:  Ciprofloxacin 9/30 >>  10/7  Flagyl 9/30 >> 10/7  Zosyn 10/7 >>  Subjective: - sleepy, appears comfortable  Objective: Vitals:    12/24/15 0600 12/24/15 1426 12/24/15 2153 12/25/15 0632  BP: (!) 105/59 (!) 112/55 (!) 108/59 122/69  Pulse: 95 92 93 (!) 108  Resp: 17 17 18 19   Temp: 98.2 F (36.8 C) 98.1 F (36.7 C) 98.6 F (37 C) 98.6 F (37 C)  TempSrc: Oral Oral Axillary Axillary  SpO2: 98% 96% 99% 100%  Weight:      Height:        Intake/Output Summary (Last 24 hours) at 12/25/15 1151 Last data filed at 12/25/15 1053  Gross per 24 hour  Intake              550 ml  Output              916 ml  Net             -366 ml   Filed Weights   12/17/15 2249 12/18/15 1238  Weight: 73 kg (161 lb) 72.6 kg (160 lb)    Examination: Constitutional: in pain,appears distressed Vitals:   12/24/15 0600 12/24/15 1426 12/24/15 2153 12/25/15 0632  BP: (!) 105/59 (!) 112/55 (!) 108/59 122/69  Pulse: 95 92 93 (!) 108  Resp: 17 17 18 19   Temp: 98.2 F (36.8 C) 98.1 F (36.7 C) 98.6 F (37 C) 98.6 F (37 C)  TempSrc: Oral Oral Axillary Axillary  SpO2: 98% 96% 99% 100%  Weight:      Height:       Eyes: PERRL Neck: normal, supple Respiratory: no wheezing, gurgling sounds Cardiovascular: Regular rate and rhythm, no murmurs / rubs / gallops. No LE edema. Abdomen: dressing in place, C/D/I Musculoskeletal: no clubbing / cyanosis. Neurologic: non focal    Data Reviewed: I have personally reviewed following labs and imaging studies  CBC:  Recent Labs Lab 12/20/15 0541 12/22/15 0728 12/23/15 0500 12/24/15 0500 12/25/15 0500  WBC 8.5 21.7* 19.8* 15.8* 14.8*  NEUTROABS  --   --   --  11.7*  --   HGB 10.1* 11.2* 9.6* 8.6* 8.6*  HCT 32.1* 34.4* 30.2* 26.5* 27.0*  MCV 96.4 95.6 95.6 95.0 95.1  PLT 366 410* 405* 393 [id number]   Basic Metabolic Panel:  Recent Labs Lab 12/20/15 0541 12/22/15 0728 12/23/15 0500 12/24/15 0500 12/25/15 0500  NA 136 137 139 139 138  K 3.6 3.3* 3.1* 3.6 3.8  CL 106 110 104 105 108  CO2 23 17* 22 26 25   GLUCOSE 86 112* 95 147* 125*  BUN 9 <5* 5* 6 8  CREATININE 0.76 0.76 0.77  0.60 0.57  CALCIUM 8.7* 7.9* 8.4* 8.7* 8.6*  MG  --   --   --  1.7 2.0  PHOS  --   --   --  1.0* 2.9   GFR: Estimated Creatinine Clearance: 47.4 mL/min (by C-G formula based on SCr of 0.57 mg/dL). Liver Function Tests:  Recent Labs Lab 12/18/15 1510 12/23/15 0500 12/24/15 0500 12/25/15 0500  AST 27 14* 14* 11*  ALT 11* 9* 9* 7*  ALKPHOS 74 57 44 41  BILITOT 0.5 0.8 0.4 0.3  PROT 7.1 4.8* 4.8* 4.9*  ALBUMIN 2.7* 1.7* 1.6* 1.6*   No results for input(s): LIPASE, AMYLASE in the last 168 hours. No results for input(s): AMMONIA in the last 168 hours. Coagulation Profile: No results for input(s): INR, PROTIME in the last 168 hours. Cardiac Enzymes: No results for input(s):  CKTOTAL, CKMB, CKMBINDEX, TROPONINI in the last 168 hours. BNP (last 3 results) No results for input(s): PROBNP in the last 8760 hours. HbA1C: No results for input(s): HGBA1C in the last 72 hours. CBG:  Recent Labs Lab 12/24/15 0516 12/24/15 1200 12/24/15 1801 12/24/15 2352 12/25/15 0629  GLUCAP 148* 149* 108* 149* 150*   Lipid Profile:  Recent Labs  12/24/15 0500  TRIG 64   Thyroid Function Tests: No results for input(s): TSH, T4TOTAL, FREET4, T3FREE, THYROIDAB in the last 72 hours. Anemia Panel: No results for input(s): VITAMINB12, FOLATE, FERRITIN, TIBC, IRON, RETICCTPCT in the last 72 hours. Urine analysis:    Component Value Date/Time   COLORURINE AMBER (A) 12/18/2015 1507   APPEARANCEUR CLEAR 12/18/2015 1507   LABSPEC >1.046 (H) 12/18/2015 1507   PHURINE 5.5 12/18/2015 1507   GLUCOSEU NEGATIVE 12/18/2015 1507   HGBUR NEGATIVE 12/18/2015 1507   BILIRUBINUR SMALL (A) 12/18/2015 1507   KETONESUR NEGATIVE 12/18/2015 1507   PROTEINUR NEGATIVE 12/18/2015 1507   UROBILINOGEN 0.2 08/04/2014 2100   NITRITE NEGATIVE 12/18/2015 1507   LEUKOCYTESUR NEGATIVE 12/18/2015 1507   Sepsis Labs: Invalid input(s): PROCALCITONIN, LACTICIDVEN  Recent Results (from the past 240 hour(s))  Urine  culture     Status: None   Collection Time: 12/17/15  3:07 PM  Result Value Ref Range Status   Specimen Description URINE, RANDOM  Final   Special Requests NONE  Final   Culture NO GROWTH  Final   Report Status 12/19/2015 FINAL  Final  Culture, blood (Routine x 2)     Status: None   Collection Time: 12/17/15 11:55 PM  Result Value Ref Range Status   Specimen Description BLOOD RIGHT HAND  Final   Special Requests IN PEDIATRIC BOTTLE 1ML  Final   Culture NO GROWTH 5 DAYS  Final   Report Status 12/23/2015 FINAL  Final  Culture, blood (Routine x 2)     Status: None   Collection Time: 12/17/15 11:59 PM  Result Value Ref Range Status   Specimen Description BLOOD RIGHT ARM  Final   Special Requests BOTTLES DRAWN AEROBIC AND ANAEROBIC 5ML  Final   Culture NO GROWTH 5 DAYS  Final   Report Status 12/23/2015 FINAL  Final  MRSA PCR Screening     Status: None   Collection Time: 12/18/15  2:03 PM  Result Value Ref Range Status   MRSA by PCR NEGATIVE NEGATIVE Final    Comment:        The GeneXpert MRSA Assay (FDA approved for NASAL specimens only), is one component of a comprehensive MRSA colonization surveillance program. It is not intended to diagnose MRSA infection nor to guide or monitor treatment for MRSA infections.       Radiology Studies: Dg Chest Port 1 View  Result Date: 12/25/2015 CLINICAL DATA:  Productive cough and shortness of breath. Surgery on 12/22/2015 for infected ventral hernia mesh and small bowel obstruction. EXAM: PORTABLE CHEST 1 VIEW COMPARISON:  CT of the abdomen 12/18/2015 and esophagram on 12/20/2015. FINDINGS: Chronic opacity in the right lower chest likely relates to the known large hiatal/paraesophageal hernia. There may be a developing subtle infiltrate in the left lower lung. Lung volumes are low. The heart size is stable. PICC line tip is in the SVC. IMPRESSION: Possible developing left lower lung infiltrate. Opacity in the right chest likely relates to  the known large hiatal/paraesophageal hernia. Electronically Signed   By: Aletta Edouard M.D.   On: 12/25/2015 11:41     Scheduled  Meds: . enoxaparin (LOVENOX) injection  40 mg Subcutaneous Q24H  . insulin aspart  0-9 Units Subcutaneous Q6H  . potassium phosphate IVPB (mmol)  10 mmol Intravenous Once  . timolol  1 drop Both Eyes QHS  . timolol  2 drop Both Eyes Daily   Continuous Infusions: . Marland KitchenTPN (CLINIMIX-E) Adult     And  . fat emulsion    . Marland KitchenTPN (CLINIMIX-E) Adult 40 mL/hr at 12/24/15 1725   And  . fat emulsion 240 mL (12/24/15 1725)    Marzetta Board, MD, PhD [facility] Pager [phone]  If 7PM-7AM, please contact night-coverage [email or web address] Password [email or web address] 12/25/2015, 11:51 AM

## 2015-12-25 NOTE — Progress Notes (Signed)
4 Days Post-Op  Subjective: Very sleepy.  Daughter says she is like this at home I have discontinued her Ativan and decreased narcotic just to be safe. Arousable and appropriate Does not ambulate at home but mobility is with a scooter. Physical therapy involved. Still has Foley  Had a good bowel movement.  Denies nausea  Objective: Vital signs in last 24 hours: Temp:  [98.1 F (36.7 C)-98.6 F (37 C)] 98.6 F (37 C) (10/07 [id number]) Pulse Rate:  [92-108] 108 (10/07 0632) Resp:  [17-19] 19 (10/07 0632) BP: (108-122)/(55-69) 122/69 (10/07 [id number]) SpO2:  [96 %-100 %] 100 % (10/07 [id number]) Last BM Date: 12/20/15  Intake/Output from previous day: 10/06 0701 - 10/07 0700 In: 550 [I.V.:200; IV Piggyback:350] Out: 701 [Urine:450; Drains:250; Stool:1] Intake/Output this shift: Total I/O In: 0  Out: 90 [Drains:90] Exam: General: Sleepy but arousable and appropriate.  In no distress HEENT [place] nasal cannula. Heart: Regular rate and rhythm.  No murmur or ectopy. Lungs.  Decreased breath sounds at bases.  No rhonchi or wheeze. Abdomen soft.  Minimally tender.  Not distended.  Some bowel sounds.  Midline incision closed.  Clean and dry.    Lab Results:  Results for orders placed or performed during the hospital encounter of 12/17/15 (from the past 24 hour(s))  Glucose, capillary     Status: Abnormal   Collection Time: 12/24/15 12:00 PM  Result Value Ref Range   Glucose-Capillary 149 (H) 65 - 99 mg/dL   Comment 1 Notify RN   Glucose, capillary     Status: Abnormal   Collection Time: 12/24/15  6:01 PM  Result Value Ref Range   Glucose-Capillary 108 (H) 65 - 99 mg/dL   Comment 1 Notify RN   Glucose, capillary     Status: Abnormal   Collection Time: 12/24/15 11:52 PM  Result Value Ref Range   Glucose-Capillary 149 (H) 65 - 99 mg/dL  Phosphorus     Status: None   Collection Time: 12/25/15  5:00 AM  Result Value Ref Range   Phosphorus 2.9 2.5 - 4.6 mg/dL  Magnesium     Status: None    Collection Time: 12/25/15  5:00 AM  Result Value Ref Range   Magnesium 2.0 1.7 - 2.4 mg/dL  Comprehensive metabolic panel     Status: Abnormal   Collection Time: 12/25/15  5:00 AM  Result Value Ref Range   Sodium 138 135 - 145 mmol/L   Potassium 3.8 3.5 - 5.1 mmol/L   Chloride 108 101 - 111 mmol/L   CO2 25 22 - 32 mmol/L   Glucose, Bld 125 (H) 65 - 99 mg/dL   BUN 8 6 - 20 mg/dL   Creatinine, Ser 0.57 0.44 - 1.00 mg/dL   Calcium 8.6 (L) 8.9 - 10.3 mg/dL   Total Protein 4.9 (L) 6.5 - 8.1 g/dL   Albumin 1.6 (L) 3.5 - 5.0 g/dL   AST 11 (L) 15 - 41 U/L   ALT 7 (L) 14 - 54 U/L   Alkaline Phosphatase 41 38 - 126 U/L   Total Bilirubin 0.3 0.3 - 1.2 mg/dL   GFR calc non Af Amer >60 >60 mL/min   GFR calc Af Amer >60 >60 mL/min   Anion gap 5 5 - 15  CBC     Status: Abnormal   Collection Time: 12/25/15  5:00 AM  Result Value Ref Range   WBC 14.8 (H) 4.0 - 10.5 K/uL   RBC 2.84 (L) 3.87 - 5.11 MIL/uL  Hemoglobin 8.6 (L) 12.0 - 15.0 g/dL   HCT 27.0 (L) 36.0 - 46.0 %   MCV 95.1 78.0 - 100.0 fL   MCH 30.3 26.0 - 34.0 pg   MCHC 31.9 30.0 - 36.0 g/dL   RDW 15.0 11.5 - 15.5 %   Platelets 394 150 - 400 K/uL  Glucose, capillary     Status: Abnormal   Collection Time: 12/25/15  6:29 AM  Result Value Ref Range   Glucose-Capillary 150 (H) 65 - 99 mg/dL     Studies/Results: No results found.  . ciprofloxacin  400 mg Intravenous BID  . enoxaparin (LOVENOX) injection  40 mg Subcutaneous Q24H  . insulin aspart  0-9 Units Subcutaneous Q6H  . metronidazole  500 mg Intravenous Q8H  . potassium chloride  10 mEq Intravenous Q1 Hr x 2  . potassium phosphate IVPB (mmol)  10 mmol Intravenous Once  . timolol  1 drop Both Eyes QHS  . timolol  2 drop Both Eyes Daily     Assessment/Plan: s/p Procedure(s): EXPLORATORY LAPAROTOMY/ REMOVAL OF MESH  Exploratory laparotomy. Lysis of adhesions. Removal of previously placed mesh. Complex abdominal wall closure and hernia repair with biologic  mesh (20 cm x 25 cm Phasix).-- 12/21/15 Dr. Ninfa Linden - POD 4 - unable to get NG tube placed postoperatively -Had a bowel movement.  Begin clear liquids cautiously -Seems overly sedated, although daughter says this is her baseline.  Discontinue Ativan.  Decrease narcotic. -Continue physical therapy  Diverticulitis - cipro/flagyl per internal medicine  Hyponatremia - 138 today Hypokalemia - 3.8 today,  Anemia - Hg 8.6 today, continue to monitor Acute hypoxic respiratory failure- IVF rate decreased per internal medicine, CXR showed only mild atelectasis; continue IS Chronic pain GERD - IV famotidine for now, likely transition back to PPI when no longer NPO  Hiatal hernia  ID - cipro/flagyl FEN - NPO, IVF VTE - SCD, lovenox   Plan - clear liquids.  Discontinue Ativan.  Decrease narcotic.  Out of bed to chair frequently   @PROBHOSP @  LOS: 7 days    Monique Russo M 12/25/2015  . .prob

## 2015-12-25 NOTE — Progress Notes (Addendum)
PARENTERAL NUTRITION CONSULT NOTE - FOLLOW UP  Pharmacy Consult: TPN Indication:  Prolonged ileus  No Known Allergies  Patient Measurements: Height: 5' (152.4 cm) Weight: 160 lb (72.6 kg) IBW/kg (Calculated) : 45.5  Vital Signs: Temp: 98.6 F (37 C) (10/07 [id number]) Temp Source: Axillary (10/07 [id number]) BP: 122/69 (10/07 [id number]) Pulse Rate: 108 (10/07 [id number]) Intake/Output from previous day: 10/06 0701 - 10/07 0700 In: 550 [I.V.:200; IV Piggyback:350] Out: 701 [Urine:450; Drains:250; Stool:1] Intake/Output from this shift: No intake/output data recorded.  Labs:  Recent Labs  12/23/15 0500 12/24/15 0500 12/25/15 0500  WBC 19.8* 15.8* 14.8*  HGB 9.6* 8.6* 8.6*  HCT 30.2* 26.5* 27.0*  PLT 405* 393 394     Recent Labs  12/23/15 0500 12/24/15 0500 12/25/15 0500  NA 139 139 138  K 3.1* 3.6 3.8  CL 104 105 108  CO2 22 26 25   GLUCOSE 95 147* 125*  BUN 5* 6 8  CREATININE 0.77 0.60 0.57  CALCIUM 8.4* 8.7* 8.6*  MG  --  1.7 2.0  PHOS  --  1.0* 2.9  PROT 4.8* 4.8* 4.9*  ALBUMIN 1.7* 1.6* 1.6*  AST 14* 14* 11*  ALT 9* 9* 7*  ALKPHOS 57 44 41  BILITOT 0.8 0.4 0.3  PREALBUMIN  --  <5*  --   TRIG  --  64  --    Estimated Creatinine Clearance: 47.4 mL/min (by C-G formula based on SCr of 0.57 mg/dL).    Recent Labs  12/24/15 1801 12/24/15 2352 12/25/15 0629  GLUCAP 108* 149* 150*     Insulin Requirements in the past 24 hours:  3 units sensitive SSI  Assessment: 45 YOF s/p ex-lap with LOA, removal of previously placed mesh, complex abdominal wall closure and hernia repair with biologic mesh.  She has been NPO x5 day since admission and >7 days per patient report.  Pharmacy consulted to manage TPN for prolonged ileus.  Per documentation, patient lost 10 lbs over the past week PTA and was eating minimally since 12/15/15.   GI: hx GERD - Pepcid in TPN.  Baseline prealbumin low at <5.  Drain O/P increased to 228mL, BM x1 Endo: no hx DM - CBGs controlled Lytes: K+ 3.8  (goal >/= 4 for ileus), others WNL Renal: SCr 0.57 stable, CrCL 47 ml/min, BUN WNL - low UOP 0.3 ml/kg/hr Pulm: satting well on 5L [place] Cards: PAH / CHF - BP soft, tachy improving (PTA Lasix, Lovaza on hold) Hepatobil: LFTs / tbili / TG WNL Neuro: depression - PTA Cymbalta, methadone, gabapentin on hold. Pain score 10, PRN Dilaudid and Toradol, off morphine PCA 10/6 ID: Cipro/Flagyl (9/30 >> (10/9)) for diverticulitis - afebrile, WBC improving Best Practices: Lovenox TPN Access: PICC placed 12/22/15 TPN start date: 12/23/15  Current Nutrition:  TPN  Nutritional Goals: 1400-1600 kCal and 65-80 gm protein per day   Plan:  - Increase Clinimix E 5/15 to goal rate of 65 ml/hr and lipids at 10 ml/hr.  TPN will provide 1588 kCal and 78 gm of protein per day, meeting 100% of patient's needs. - Daily multivitamin and trace elements in TPN - Pepcid 20mg  daily in TPN - KPhos 10 mmol IV x 1 - KCL x 2 runs - Mag sulfate 1gm IV x 1 - Continue sensitive SSI Q6H.  D/C if CBGs remain controlled at goal TPN rate. - F/U diet initiation, pain control, TPN labs on Monday   Monique Russo, PharmD, BCPS Pager:  [phone] 12/25/2015, 7:09 AM    ================================  Addendum: - Change abx to Zosyn for aspiration PNA - Zosyn 3.375gm IV Q8H, 4 hr infusion - Pharmacy will sign off of Zosyn dosing and follow peripherally.  Thank you for the consult!   Monique Russo, PharmD, BCPS Pager:  [phone] 12/25/2015, 12:18 PM

## 2015-12-25 NOTE — Evaluation (Signed)
Physical Therapy Evaluation Patient Details Name: Monique Russo MRN: [id number] DOB: Sep 10, 1930 Today's Date: 12/25/2015   History of Present Illness  82 y.o. female with a past medical history significant for chronic pain on methadone and chronic non-healing surgical wound who presented with abdominal pain and vomiting for 3 days. Now s/p ExLap, LOA, removal of prior mesh and complex abdominal wall closure and hernia repair with biologic mesh on 10/3.  Clinical Impression  Pt was able to stand and pivot x2 with two person assist to [facility] and recliner chair.  Pt's baseline mobility is poor due to chronic low back pain.  She is however, usually mod I with a stand/squat pivot transfer into and out of an electric scooter.  She is not currently able to do this without two person physical assistance.  She would like to avoid SNF for rehab, but will have to move much better to be safe returning home with her elderly husband assisting.   PT to follow acutely for deficits listed below.       Follow Up Recommendations SNF    Equipment Recommendations  None recommended by PT    Recommendations for Other Services   NA    Precautions / Restrictions Precautions Precautions: Fall;Other (comment) Precaution Comments: abdominal incision and JP drain R LQ      Mobility  Bed Mobility Overal bed mobility: Needs Assistance;+2 for physical assistance Bed Mobility: Supine to Sit     Supine to sit: HOB elevated;+2 for physical assistance;Mod assist     General bed mobility comments: Two person mod assist to help pt progress bil legs to EOB and support trunk when coming to sitting.  Pt throwing backwards during transition, not from pain in abdomen, but pain in back as we are coming up.  Assist needed to weight shift to get to EOB.   Transfers Overall transfer level: Needs assistance Equipment used: 2 person hand held assist Transfers: Sit to/from [facility] Sit to Stand: +2  physical assistance;Mod assist Stand pivot transfers: +2 physical assistance;Mod assist       General transfer comment: Two person mod assist to transfer to [facility] and then to recliner chair.  assist needed to support trunk to power up onto her feet, for balance once on her feet and for control to make sure she truned her hips all the way around to the intended sitting surface and did not sit prematurely.  Pt was able to take some small pivotal steps around and relied heavily on the armrests of the [facility] and chair for support while stepping.   Ambulation/Gait             General Gait Details: Pt does not ambulate at baseline, transfers only.          Balance Overall balance assessment: Needs assistance Sitting-balance support: Feet supported;Bilateral upper extremity supported Sitting balance-Leahy Scale: Fair Sitting balance - Comments: supervision EOB with bil arms used to prop for balance   Standing balance support: Bilateral upper extremity supported Standing balance-Leahy Scale: Poor Standing balance comment: Mod assist and must have both hands supported.                              Pertinent Vitals/Pain Pain Assessment: Faces Faces Pain Scale: Hurts worst Pain Location: low back more than her abdomen Pain Descriptors / Indicators: Constant;Burning;Aching Pain Intervention(s): Limited activity within patient's tolerance;Monitored during session;Repositioned;Heat applied (heat applied to low back)  Home Living Family/patient expects to be discharged to:: Private residence Living Arrangements: Spouse/significant other Available Help at Discharge: Family;Available 24 hours/day;Personal care attendant (spouse can provide very little assist) Type of Home: House Home Access: Stairs to enter Entrance Stairs-Rails: None Entrance Stairs-Number of Steps: 1 Home Layout: One level   Additional Comments: aide 2 times per week for 2 hours to help with bathing--assist  pt to get into tub with seat, she does step over assisted.    Prior Function Level of Independence: Needs assistance   Gait / Transfers Assistance Needed: power chair ar baseline. lateral scooting for transfers. Does not stand at baseline, does sit EOB per pt and sister's report.  (from OT eval).  per daughter she can transfer into her electric chair, can transfer to the toilet byherself and can preform peri care by herself at baseline.  Husband has to help her down the one step and immediately in the car when entering and exiting her home, but he can literally pull up to the door and she only has to take one or two assisted steps to the car.               Extremity/Trunk Assessment   Upper Extremity Assessment: Defer to OT evaluation           Lower Extremity Assessment: Generalized weakness      Cervical / Trunk Assessment: Other exceptions  Communication   Communication: No difficulties  Cognition Arousal/Alertness: Awake/alert Behavior During Therapy: Anxious Overall Cognitive Status: Within Functional Limits for tasks assessed (not specifically tested)                             Assessment/Plan    PT Assessment Patient needs continued PT services  PT Problem List Decreased strength;Decreased activity tolerance;Decreased balance;Decreased mobility;Decreased knowledge of use of DME;Obesity;Pain          PT Treatment Interventions DME instruction;Gait training;Stair training;Functional mobility training;Therapeutic activities;Therapeutic exercise;Balance training;Patient/family education;Modalities    PT Goals (Current goals can be found in the Care Plan section)  Acute Rehab PT Goals Patient Stated Goal: decrease her low back pain, go home at discharge PT Goal Formulation: With patient/family Time For Goal Achievement: 01/08/16 Potential to Achieve Goals: Good    Frequency Min 3X/week   Barriers to discharge Decreased caregiver support Pt lives  with elderly husband       End of Session Equipment Utilized During Treatment: Gait belt (up high) Activity Tolerance: Patient limited by fatigue;Patient limited by pain Patient left: in chair;with call bell/phone within reach;with chair alarm set;with family/visitor present Nurse Communication: Patient requests pain meds         Time: 1128-1201 PT Time Calculation (min) (ACUTE ONLY): 33 min   Charges:   PT Evaluation $PT Eval Moderate Complexity: 1 Procedure PT Treatments $Therapeutic Activity: 8-22 mins        Franki Alcaide B. [place], [place], DPT [phone]   12/25/2015, 12:17 PM

## 2015-12-26 LAB — BASIC METABOLIC PANEL
Anion gap: 6 (ref 5–15)
BUN: 9 mg/dL (ref 6–20)
CHLORIDE: 102 mmol/L (ref 101–111)
CO2: 23 mmol/L (ref 22–32)
Calcium: 8.3 mg/dL — ABNORMAL LOW (ref 8.9–10.3)
Creatinine, Ser: 0.54 mg/dL (ref 0.44–1.00)
GFR calc non Af Amer: >60 mL/min (ref >60)
Glucose, Bld: 139 mg/dL — ABNORMAL HIGH (ref 65–99)
POTASSIUM: 3.8 mmol/L (ref 3.5–5.1)
SODIUM: 131 mmol/L — AB (ref 135–145)

## 2015-12-26 LAB — PHOSPHORUS: PHOSPHORUS: 2.6 mg/dL (ref 2.5–4.6)

## 2015-12-26 LAB — GLUCOSE, CAPILLARY
GLUCOSE-CAPILLARY: 150 mg/dL — AB (ref 65–99)
Glucose-Capillary: 144 mg/dL — ABNORMAL HIGH (ref 65–99)

## 2015-12-26 LAB — MAGNESIUM: MAGNESIUM: 1.8 mg/dL (ref 1.7–2.4)

## 2015-12-26 MED ORDER — SODIUM PHOSPHATES 45 MMOLE/15ML IV SOLN
20.0000 mmol | Freq: Once | INTRAVENOUS | Status: AC
  Administered 2015-12-26: 20 mmol via INTRAVENOUS
  Filled 2015-12-26: qty 6.67

## 2015-12-26 MED ORDER — HYDROMORPHONE HCL 1 MG/ML IJ SOLN
0.5000 mg | INTRAMUSCULAR | Status: DC | PRN
  Administered 2015-12-26 – 2015-12-29 (×12): 0.5 mg via INTRAVENOUS
  Filled 2015-12-26 (×13): qty 1

## 2015-12-26 MED ORDER — TRACE MINERALS CR-CU-MN-SE-ZN 10-1000-500-60 MCG/ML IV SOLN
INTRAVENOUS | Status: AC
  Administered 2015-12-26: 18:00:00 via INTRAVENOUS
  Filled 2015-12-26: qty 1560

## 2015-12-26 MED ORDER — METHADONE HCL 10 MG PO TABS
5.0000 mg | ORAL_TABLET | Freq: Three times a day (TID) | ORAL | Status: DC
  Administered 2015-12-26 – 2015-12-29 (×8): 5 mg via ORAL
  Filled 2015-12-26 (×9): qty 1

## 2015-12-26 MED ORDER — HYDROMORPHONE HCL 1 MG/ML IJ SOLN
1.0000 mg | Freq: Once | INTRAMUSCULAR | Status: AC
  Administered 2015-12-26: 1 mg via INTRAVENOUS

## 2015-12-26 MED ORDER — PANTOPRAZOLE SODIUM 40 MG PO TBEC
40.0000 mg | DELAYED_RELEASE_TABLET | Freq: Every day | ORAL | Status: DC
  Administered 2015-12-26 – 2016-01-06 (×12): 40 mg via ORAL
  Filled 2015-12-26 (×12): qty 1

## 2015-12-26 MED ORDER — FAT EMULSION 20 % IV EMUL
240.0000 mL | INTRAVENOUS | Status: DC
  Administered 2015-12-26: 240 mL via INTRAVENOUS
  Filled 2015-12-26: qty 250

## 2015-12-26 MED ORDER — MAGNESIUM SULFATE 2 GM/50ML IV SOLN
2.0000 g | Freq: Once | INTRAVENOUS | Status: AC
  Administered 2015-12-26: 2 g via INTRAVENOUS
  Filled 2015-12-26: qty 50

## 2015-12-26 MED ORDER — METHADONE HCL 10 MG PO TABS: 5.0000 mg | ORAL_TABLET | Freq: Three times a day (TID) | ORAL | Status: DC

## 2015-12-26 MED ORDER — POTASSIUM CHLORIDE 10 MEQ/50ML IV SOLN
10.0000 meq | INTRAVENOUS | Status: AC
  Administered 2015-12-26 (×4): 10 meq via INTRAVENOUS
  Filled 2015-12-26 (×4): qty 50

## 2015-12-26 NOTE — Progress Notes (Signed)
5 Days Post-Op  Subjective: Has had problems with pain.  Alternatively has had problems with oversedation. Tolerating clear liquid diet.  Has had a few bowel movements.  No nausea or vomiting. Has had some wound drainage. Working with PT very slow progress.  PT recommends SNF.  I had a long talk with the patient and especially her daughter about the numerous current problems she has and future challenges.  I explained that she seemed to have a wound infection and we had to remove some staples today and packed the wound.  Will ask [facility] nursing to consult  Objective: Vital signs in last 24 hours: Temp:  [98 F (36.7 C)-99.8 F (37.7 C)] 98.8 F (37.1 C) (10/08 0551) Pulse Rate:  [92-102] 102 (10/08 0551) Resp:  [19-20] 20 (10/08 0551) BP: (118-136)/(62-75) 136/75 (10/07 2024) SpO2:  [97 %-100 %] 97 % (10/08 0551) Last BM Date: 12/25/15  Intake/Output from previous day: 10/07 0701 - 10/08 0700 In: 2620.4 [P.O.:500; I.V.:1860.4; IV Piggyback:260] Out: 881 [Urine:700; Drains:180; Stool:1] Intake/Output this shift: No intake/output data recorded.   General: Sleepy but arousable and appropriate.  In no distress HEENT On nasal cannula. Heart: Regular rate and rhythm.  No murmur or ectopy. Lungs.  Decreased breath sounds at bases.  No rhonchi or wheeze. Abdomen soft.  Minimally tender.   some cloudy watery drainage from wound with skin edge separation apparent.  Some skin erythema in this area as well.  I removed skin staples from the central portion of the wound which exposes the mesh.  No gross purulence.  Hernia repair intact.  No odor.  Wound packed.   Lab Results:  Results for orders placed or performed during the hospital encounter of 12/17/15 (from the past 24 hour(s))  Glucose, capillary     Status: Abnormal   Collection Time: 12/25/15 11:56 AM  Result Value Ref Range   Glucose-Capillary 158 (H) 65 - 99 mg/dL  Glucose, capillary     Status: Abnormal   Collection Time:  12/25/15  5:46 PM  Result Value Ref Range   Glucose-Capillary 141 (H) 65 - 99 mg/dL  Glucose, capillary     Status: Abnormal   Collection Time: 12/25/15 11:43 PM  Result Value Ref Range   Glucose-Capillary 155 (H) 65 - 99 mg/dL     Studies/Results: No results found.  . enoxaparin (LOVENOX) injection  40 mg Subcutaneous Q24H  . insulin aspart  0-9 Units Subcutaneous Q6H  . methadone  5 mg Oral Q8H  . piperacillin-tazobactam (ZOSYN)  IV  3.375 g Intravenous Q8H  . timolol  1 drop Both Eyes QHS  . timolol  2 drop Both Eyes Daily     Assessment/Plan: s/p Procedure(s): EXPLORATORY LAPAROTOMY/ REMOVAL OF MESH    Exploratory laparotomy. Lysis of adhesions. Removal of previously placed mesh. Complex abdominal wall closure and hernia repair with biologic mesh (20 cm x 25 cm Phasix).-- 12/21/15 Dr. Ninfa Linden - POD 4 - unable to get NG tube placed postoperatively -GI tract opening up.  Advance to full liquids -Continue TNA for now. -Explained to daughter the need to carefully titrate narcotics and sedatives. -Continue physical therapy  Wound infection.  Will ask [place] nursing to consult  Diverticulitis - cipro/flagyl per internal medicine  Hyponatremia - 138 on 10/7 Hypokalemia - 3.8  On 10/7 Anemia - Hg 8.6 on 10/7 continue to monitor Acute hypoxic respiratory failure- IVF rate decreased per internal medicine, CXR showed only mild atelectasis; continue IS Chronic pain GERD - IV famotidine for  now, likely transition back to PPI when no longer NPO  Hiatal hernia  ID - cipro/flagyl FEN - FL,TNA,, IVF VTE - SCD, lovenox   Plan - clear liquids.  Discontinue Ativan.  Decrease narcotic.  Out of bed to chair frequently   @PROBHOSP @  LOS: 8 days    Monique Russo M 12/26/2015  . .prob

## 2015-12-26 NOTE — Progress Notes (Addendum)
Offered bath and pt daughter refused

## 2015-12-26 NOTE — Progress Notes (Signed)
PROGRESS NOTE  Monique Russo [id number] DOB: April 05, 1930 DOA: 12/17/2015 PCP: Horatio Pel, MD   LOS: 8 days   Brief Narrative: Monique Russo a 82 y.o.femalewith a past medical history significant for chronic pain on methadone and chronic non-healing surgical woundwho presented with abdominal pain and vomiting for 3 days. The patient had a perforated ulcer 1 year ago requiring [name] patch.This surgery was unfortunately complicated by nonhealing surgical wound (because of 80 year old infected hernia repair mesh?), Which for the last year the patient has been having wound care by her general surgeon in the office every six weeks (per family). This week, two days prior to admission, the patient had an office debridement and described that Dr. Excell Seltzer did his usual debridement, after which she began to develop nausea and LLQ abdominal pain. This crampy, severe, LLQ aching abdominal pain continued intermittently and was associated with several episodes of nonbloody emesis and obstipation. On the morning of admission, she was recommended by surgery office for ER evaluation. CT of the abdomen and pelvis was obtained that showed small bowel obstruction with transition point in the right mid abdomenand stranding around the colon, consistent with diverticulitis. Surgery was consulted and patient was admitted for further workup. Patient's condition did not improve and she underwent ExLap, LOA, removal of prior mesh and complex abdominal wall closure and hernia repair with biologic mesh on 10/3.  Assessment & Plan: Principal Problem:   Small bowel obstruction Active Problems:   Hyponatremia   Chronic pain syndrome   Diverticulitis of large intestine without perforation or abscess without bleeding   SBO (small bowel obstruction)   Small bowel obstruction - Per patient, had a BM 10/2, however abdominal x-ray 10/3 showed persistent high-grade small bowel obstruction - general  surgery, following, patient underwent ExLap, LOA, removal of prior mesh and complex abdominal wall closure and hernia repair with biologic mesh on 10/3. - quite sleepy this morning, noticed surgery decreased her narcotics. Agree - continues to have BMs, tolerating a diet  Diverticulitis - There is stranding around the colon that suggests diverticulitis. - was on Ciprofloxacin and Flagyl IV for 8 days. Now on Zosyn day 2/5  Aspiration PNA vs HCAP - SLP evaluation ordered, discussed at length with daughter, she understands aspiration risk - CXR with developing PNA - started Zosyn which will have good intra-abdominal coverage as well - patient complains of productive cough over the past day  Hyponatremia - Improving - on TPN  Chronic pain syndrome - resume methadone today, space out dilaudid  GERD and hiatal hernia - Replace PPI with IV famotidine while NPO  - DG barium esophagus showed marked lower esophageal tortuosity with large hiatal hernia but no mass. For definitive imaging, patient will need EGD but this can be done at a later date. This may contribute to her aspiration / reflux, discussed with daughter at bedside 10/6   Acute hypoxic respiratory failure - post op requiring 4L [place] - CXR without overt pulmonary edema 10/4 however developing infiltrate 10/7. Abx as above - closely monitor respiratory status    DVT prophylaxis: SCD Code Status: Full Family Communication: d/w daughter at bedside Disposition Plan: remain inpatient  Consultants:   General surgery  Procedures:   ExLap, LOA, removal of prior mesh and complex abdominal wall closure and hernia repair with biologic mesh on 10/3.  Foley: yes  Antimicrobials:  Ciprofloxacin 9/30 >> 10/7  Flagyl 9/30 >> 10/7  Zosyn 10/7 >>  Subjective: - very drowsy in am, seen again in  pm, alert and comfortable  Objective: Vitals:   12/25/15 1417 12/25/15 2024 12/26/15 0551 12/26/15 1501  BP: 118/62 136/75   129/63  Pulse: 92 96 (!) 102 89  Resp: 19 20 20 18   Temp: 98 F (36.7 C) 99.8 F (37.7 C) 98.8 F (37.1 C) (!) 100.5 F (38.1 C)  TempSrc: Oral Oral Oral Oral  SpO2: 99% 100% 97% 100%  Weight:      Height:        Intake/Output Summary (Last 24 hours) at 12/26/15 1516 Last data filed at 12/26/15 1502  Gross per 24 hour  Intake          2120.41 ml  Output              610 ml  Net          1510.41 ml   Filed Weights   12/17/15 2249 12/18/15 1238  Weight: 73 kg (161 lb) 72.6 kg (160 lb)    Examination: Constitutional: in pain,appears distressed Vitals:   12/25/15 1417 12/25/15 2024 12/26/15 0551 12/26/15 1501  BP: 118/62 136/75  129/63  Pulse: 92 96 (!) 102 89  Resp: 19 20 20 18   Temp: 98 F (36.7 C) 99.8 F (37.7 C) 98.8 F (37.1 C) (!) 100.5 F (38.1 C)  TempSrc: Oral Oral Oral Oral  SpO2: 99% 100% 97% 100%  Weight:      Height:       Eyes: PERRL Neck: normal, supple Respiratory: no wheezing, gurgling sounds Cardiovascular: Regular rate and rhythm, no murmurs / rubs / gallops. No LE edema. Abdomen: dressing in place, C/D/I. Surgical site with possible incipient dehiscence. Musculoskeletal: no clubbing / cyanosis. Neurologic: non focal    Data Reviewed: I have personally reviewed following labs and imaging studies  CBC:  Recent Labs Lab 12/20/15 0541 12/22/15 0728 12/23/15 0500 12/24/15 0500 12/25/15 0500  WBC 8.5 21.7* 19.8* 15.8* 14.8*  NEUTROABS  --   --   --  11.7*  --   HGB 10.1* 11.2* 9.6* 8.6* 8.6*  HCT 32.1* 34.4* 30.2* 26.5* 27.0*  MCV 96.4 95.6 95.6 95.0 95.1  PLT 366 410* 405* 393 [id number]   Basic Metabolic Panel:  Recent Labs Lab 12/22/15 0728 12/23/15 0500 12/24/15 0500 12/25/15 0500 12/26/15 1100  NA 137 139 139 138 131*  K 3.3* 3.1* 3.6 3.8 3.8  CL 110 104 105 108 102  CO2 17* 22 26 25 23   GLUCOSE 112* 95 147* 125* 139*  BUN <5* 5* 6 8 9   CREATININE 0.76 0.77 0.60 0.57 0.54  CALCIUM 7.9* 8.4* 8.7* 8.6* 8.3*  MG  --   --   1.7 2.0 1.8  PHOS  --   --  1.0* 2.9 2.6   GFR: Estimated Creatinine Clearance: 47.4 mL/min (by C-G formula based on SCr of 0.54 mg/dL). Liver Function Tests:  Recent Labs Lab 12/23/15 0500 12/24/15 0500 12/25/15 0500  AST 14* 14* 11*  ALT 9* 9* 7*  ALKPHOS 57 44 41  BILITOT 0.8 0.4 0.3  PROT 4.8* 4.8* 4.9*  ALBUMIN 1.7* 1.6* 1.6*   No results for input(s): LIPASE, AMYLASE in the last 168 hours. No results for input(s): AMMONIA in the last 168 hours. Coagulation Profile: No results for input(s): INR, PROTIME in the last 168 hours. Cardiac Enzymes: No results for input(s): CKTOTAL, CKMB, CKMBINDEX, TROPONINI in the last 168 hours. BNP (last 3 results) No results for input(s): PROBNP in the last 8760 hours. HbA1C: No results for input(s): HGBA1C  in the last 72 hours. CBG:  Recent Labs Lab 12/25/15 0629 12/25/15 1156 12/25/15 1746 12/25/15 2343 12/26/15 1239  GLUCAP 150* 158* 141* 155* 150*   Lipid Profile:  Recent Labs  12/24/15 0500  TRIG 64   Thyroid Function Tests: No results for input(s): TSH, T4TOTAL, FREET4, T3FREE, THYROIDAB in the last 72 hours. Anemia Panel: No results for input(s): VITAMINB12, FOLATE, FERRITIN, TIBC, IRON, RETICCTPCT in the last 72 hours. Urine analysis:    Component Value Date/Time   COLORURINE AMBER (A) 12/18/2015 1507   APPEARANCEUR CLEAR 12/18/2015 1507   LABSPEC >1.046 (H) 12/18/2015 1507   PHURINE 5.5 12/18/2015 1507   GLUCOSEU NEGATIVE 12/18/2015 1507   HGBUR NEGATIVE 12/18/2015 1507   BILIRUBINUR SMALL (A) 12/18/2015 1507   KETONESUR NEGATIVE 12/18/2015 1507   PROTEINUR NEGATIVE 12/18/2015 1507   UROBILINOGEN 0.2 08/04/2014 2100   NITRITE NEGATIVE 12/18/2015 1507   LEUKOCYTESUR NEGATIVE 12/18/2015 1507   Sepsis Labs: Invalid input(s): PROCALCITONIN, LACTICIDVEN  Recent Results (from the past 240 hour(s))  Urine culture     Status: None   Collection Time: 12/17/15  3:07 PM  Result Value Ref Range Status    Specimen Description URINE, RANDOM  Final   Special Requests NONE  Final   Culture NO GROWTH  Final   Report Status 12/19/2015 FINAL  Final  Culture, blood (Routine x 2)     Status: None   Collection Time: 12/17/15 11:55 PM  Result Value Ref Range Status   Specimen Description BLOOD RIGHT HAND  Final   Special Requests IN PEDIATRIC BOTTLE 1ML  Final   Culture NO GROWTH 5 DAYS  Final   Report Status 12/23/2015 FINAL  Final  Culture, blood (Routine x 2)     Status: None   Collection Time: 12/17/15 11:59 PM  Result Value Ref Range Status   Specimen Description BLOOD RIGHT ARM  Final   Special Requests BOTTLES DRAWN AEROBIC AND ANAEROBIC 5ML  Final   Culture NO GROWTH 5 DAYS  Final   Report Status 12/23/2015 FINAL  Final  MRSA PCR Screening     Status: None   Collection Time: 12/18/15  2:03 PM  Result Value Ref Range Status   MRSA by PCR NEGATIVE NEGATIVE Final    Comment:        The GeneXpert MRSA Assay (FDA approved for NASAL specimens only), is one component of a comprehensive MRSA colonization surveillance program. It is not intended to diagnose MRSA infection nor to guide or monitor treatment for MRSA infections.       Radiology Studies: Dg Chest Port 1 View  Result Date: 12/25/2015 CLINICAL DATA:  Productive cough and shortness of breath. Surgery on 12/22/2015 for infected ventral hernia mesh and small bowel obstruction. EXAM: PORTABLE CHEST 1 VIEW COMPARISON:  CT of the abdomen 12/18/2015 and esophagram on 12/20/2015. FINDINGS: Chronic opacity in the right lower chest likely relates to the known large hiatal/paraesophageal hernia. There may be a developing subtle infiltrate in the left lower lung. Lung volumes are low. The heart size is stable. PICC line tip is in the SVC. IMPRESSION: Possible developing left lower lung infiltrate. Opacity in the right chest likely relates to the known large hiatal/paraesophageal hernia. Electronically Signed   By: Aletta Edouard M.D.   On:  12/25/2015 11:41     Scheduled Meds: . enoxaparin (LOVENOX) injection  40 mg Subcutaneous Q24H  . insulin aspart  0-9 Units Subcutaneous Q6H  . methadone  5 mg Oral Q8H  .  pantoprazole  40 mg Oral Daily  . piperacillin-tazobactam (ZOSYN)  IV  3.375 g Intravenous Q8H  . potassium chloride  10 mEq Intravenous Q1 Hr x 4  . sodium phosphate  Dextrose 5% IVPB  20 mmol Intravenous Once  . timolol  1 drop Both Eyes QHS  . timolol  2 drop Both Eyes Daily   Continuous Infusions: . Marland KitchenTPN (CLINIMIX-E) Adult 65 mL/hr at 12/25/15 1739   And  . fat emulsion 240 mL (12/25/15 1739)  . Marland KitchenTPN (CLINIMIX-E) Adult     And  . fat emulsion      Marzetta Board, MD, PhD [facility] Pager [phone]  If 7PM-7AM, please contact night-coverage [email or web address] Password [email or web address] 12/26/2015, 3:16 PM

## 2015-12-26 NOTE — Progress Notes (Signed)
SLP Cancellation Note  Patient Details Name: Monique Russo MRN: [id number] DOB: 07-Feb-1931   Cancelled treatment:       Reason Eval/Treat Not Completed: Pain limiting ability to participate;Fatigue/lethargy limiting ability to participate;Medical issues which prohibited therapy; pt having extreme pain per nursing; recent pain medications administered.   Madisun Hargrove,PAT, M.S., CCC-SLP 12/26/2015, 4:58 PM

## 2015-12-26 NOTE — Progress Notes (Signed)
0900 Pt is lethargic, moans with verbal stimuli.  Pt had IV narcotic at 0700. Per daughter, pt was in a lot of pain all night and was not able to sleep. Daughter refused for morning care/bath this morning, refused to turn pt. A pillow placed under bil lower legs to keep heels off the bed. Explained to the daughter the prevention of pressure ulcers. She said that let the pt sleep.

## 2015-12-26 NOTE — Progress Notes (Addendum)
PARENTERAL NUTRITION CONSULT NOTE - FOLLOW UP  Pharmacy Consult: TPN Indication:  Prolonged ileus  No Known Allergies  Patient Measurements: Height: 5' (152.4 cm) Weight: 160 lb (72.6 kg) IBW/kg (Calculated) : 45.5  Vital Signs: Temp: 98.8 F (37.1 C) (10/08 0551) Temp Source: Oral (10/08 0551) BP: 136/75 (10/07 2024) Pulse Rate: 102 (10/08 0551) Intake/Output from previous day: 10/07 0701 - 10/08 0700 In: 1594.2 [P.O.:500; I.V.:934.2; IV Piggyback:160] Out: 881 [Urine:700; Drains:180; Stool:1] Intake/Output from this shift: No intake/output data recorded.  Labs:  Recent Labs  12/24/15 0500 12/25/15 0500  WBC 15.8* 14.8*  HGB 8.6* 8.6*  HCT 26.5* 27.0*  PLT 393 394     Recent Labs  12/24/15 0500 12/25/15 0500  NA 139 138  K 3.6 3.8  CL 105 108  CO2 26 25  GLUCOSE 147* 125*  BUN 6 8  CREATININE 0.60 0.57  CALCIUM 8.7* 8.6*  MG 1.7 2.0  PHOS 1.0* 2.9  PROT 4.8* 4.9*  ALBUMIN 1.6* 1.6*  AST 14* 11*  ALT 9* 7*  ALKPHOS 44 41  BILITOT 0.4 0.3  PREALBUMIN <5*  --   TRIG 64  --    Estimated Creatinine Clearance: 47.4 mL/min (by C-G formula based on SCr of 0.57 mg/dL).    Recent Labs  12/25/15 1156 12/25/15 1746 12/25/15 2343  GLUCAP 158* 141* 155*     Insulin Requirements in the past 24 hours:  5 units sensitive SSI  Assessment: [name] s/p ex-lap with LOA, removal of previously placed mesh, complex abdominal wall closure and hernia repair with biologic mesh.  She has been NPO x5 day since admission and >7 days per patient report.  Pharmacy consulted to manage TPN for prolonged ileus.  Per documentation, patient lost 10 lbs over the past week PTA and was eating minimally since 12/15/15.   GI: hx GERD - Pepcid in TPN.  Baseline prealbumin low at <5.  Drain O/P increased to 15mL, BM x1.  Tolerated clear liquid diet. Endo: no hx DM - CBGs controlled Lytes: less c/w refeeding - K+ 3.8 (goal >/= 4 for ileus), Mag 1.8 (goal >/= 2 for ileus), others  WNL Renal: SCr 0.57 stable, CrCL 47 ml/min, BUN WNL - low UOP 0.4 ml/kg/hr Pulm: satting well on 5L [place] Cards: PAH / CHF - BP soft, tachy improving (PTA Lasix, Lovaza on hold) Hepatobil: LFTs / tbili / TG WNL Neuro: depression - PTA Cymbalta, methadone, gabapentin on hold. Pain score 8, PRN Dilaudid and Toradol, off morphine PCA 10/6.  Seems oversedated but at baseline per daughter ID: Zosyn D#2 for asp PNA, s/p 8d Cipro/Flagyl for diverticulitis - afebrile, WBC improving Best Practices: Lovenox TPN Access: PICC placed 12/22/15 TPN start date: 12/23/15  Current Nutrition:  TPN Full liquid  Nutritional Goals: 1400-1600 kCal and 65-80 gm protein per day   Plan:  - Continue Clinimix E 5/15 at 65 ml/hr and lipids at 10 ml/hr.  TPN provides 1588 kCal and 78 gm of protein per day, meeting 100% of patient's needs. - Daily multivitamin and trace elements in TPN - Pepcid 20mg  daily in TPN - KCL x4 runs - Mag sulfate 2gm IV x 1 - NaPhos 20 mmol IV x 1 - Continue sensitive SSI Q6H.  D/C if CBGs remain controlled at goal TPN rate. - F/U PO intake/diet advancement to wean off of TPN - F/U AM labs   Monique Russo D. Mina Marble, PharmD, BCPS Pager:  [phone] 12/26/2015, 1:10 PM

## 2015-12-27 LAB — COMPREHENSIVE METABOLIC PANEL
ALBUMIN: 1.5 g/dL — AB (ref 3.5–5.0)
ALT: 7 U/L — ABNORMAL LOW (ref 14–54)
ANION GAP: 5 (ref 5–15)
AST: 14 U/L — AB (ref 15–41)
Alkaline Phosphatase: 62 U/L (ref 38–126)
BUN: 12 mg/dL (ref 6–20)
CHLORIDE: 102 mmol/L (ref 101–111)
CO2: 25 mmol/L (ref 22–32)
Calcium: 8.2 mg/dL — ABNORMAL LOW (ref 8.9–10.3)
Creatinine, Ser: 0.57 mg/dL (ref 0.44–1.00)
GFR calc Af Amer: >60 mL/min (ref >60)
GFR calc non Af Amer: >60 mL/min (ref >60)
GLUCOSE: 113 mg/dL — AB (ref 65–99)
POTASSIUM: 4.3 mmol/L (ref 3.5–5.1)
Sodium: 132 mmol/L — ABNORMAL LOW (ref 135–145)
Total Bilirubin: 0.2 mg/dL — ABNORMAL LOW (ref 0.3–1.2)
Total Protein: 4.6 g/dL — ABNORMAL LOW (ref 6.5–8.1)

## 2015-12-27 LAB — GLUCOSE, CAPILLARY
GLUCOSE-CAPILLARY: 122 mg/dL — AB (ref 65–99)
GLUCOSE-CAPILLARY: 135 mg/dL — AB (ref 65–99)
Glucose-Capillary: 115 mg/dL — ABNORMAL HIGH (ref 65–99)
Glucose-Capillary: 113 mg/dL — ABNORMAL HIGH (ref 65–99)

## 2015-12-27 LAB — C DIFFICILE QUICK SCREEN W PCR REFLEX
C DIFFICILE (CDIFF) TOXIN: NEGATIVE
C DIFFICLE (CDIFF) ANTIGEN: NEGATIVE
C Diff interpretation: NEGATIVE

## 2015-12-27 LAB — DIFFERENTIAL
BASOS PCT: 0 %
Basophils Absolute: 0 10*3/uL (ref 0.0–0.1)
EOS ABS: 0.6 10*3/uL (ref 0.0–0.7)
EOS PCT: 3 %
LYMPHS PCT: 10 %
Lymphs Abs: 2 10*3/uL (ref 0.7–4.0)
Monocytes Absolute: 2 10*3/uL — ABNORMAL HIGH (ref 0.1–1.0)
Monocytes Relative: 10 %
NEUTROS PCT: 77 %
Neutro Abs: 15.1 10*3/uL — ABNORMAL HIGH (ref 1.7–7.7)

## 2015-12-27 LAB — CBC
HCT: 25.2 % — ABNORMAL LOW (ref 36.0–46.0)
HEMOGLOBIN: 8.1 g/dL — AB (ref 12.0–15.0)
MCH: 29.8 pg (ref 26.0–34.0)
MCHC: 32.1 g/dL (ref 30.0–36.0)
MCV: 92.6 fL (ref 78.0–100.0)
PLATELETS: 381 10*3/uL (ref 150–400)
RBC: 2.72 MIL/uL — AB (ref 3.87–5.11)
RDW: 14.8 % (ref 11.5–15.5)
WBC: 19.7 10*3/uL — AB (ref 4.0–10.5)

## 2015-12-27 LAB — PHOSPHORUS: Phosphorus: 3.3 mg/dL (ref 2.5–4.6)

## 2015-12-27 LAB — MAGNESIUM: MAGNESIUM: 2.1 mg/dL (ref 1.7–2.4)

## 2015-12-27 LAB — TRIGLYCERIDES: Triglycerides: 64 mg/dL (ref <150)

## 2015-12-27 LAB — PREALBUMIN: Prealbumin: 6.9 mg/dL — ABNORMAL LOW (ref 18–38)

## 2015-12-27 MED ORDER — CLINIMIX E/DEXTROSE (5/15) 5 % IV SOLN
INTRAVENOUS | Status: AC
  Administered 2015-12-27: 17:00:00 via INTRAVENOUS
  Filled 2015-12-27: qty 1560

## 2015-12-27 MED ORDER — FAT EMULSION 20 % IV EMUL
240.0000 mL | INTRAVENOUS | Status: AC
  Administered 2015-12-27: 240 mL via INTRAVENOUS
  Filled 2015-12-27: qty 250

## 2015-12-27 MED ORDER — ADULT MULTIVITAMIN W/MINERALS CH
1.0000 | ORAL_TABLET | Freq: Every day | ORAL | Status: DC
  Administered 2015-12-27 – 2015-12-30 (×4): 1 via ORAL
  Filled 2015-12-27 (×4): qty 1

## 2015-12-27 MED ORDER — BOOST PLUS PO LIQD
237.0000 mL | Freq: Three times a day (TID) | ORAL | Status: DC
  Administered 2015-12-27 – 2016-01-06 (×25): 237 mL via ORAL
  Filled 2015-12-27 (×42): qty 237

## 2015-12-27 NOTE — Progress Notes (Signed)
Physical Therapy Treatment Patient Details Name: Velva Marx MRN: [id number] DOB: 1930/10/14 Today's Date: 12/27/2015    History of Present Illness 82 y.o. female with a past medical history significant for chronic pain on methadone and chronic non-healing surgical wound who presented with abdominal pain and vomiting for 3 days. Now s/p ExLap, LOA, removal of prior mesh and complex abdominal wall closure and hernia repair with biologic mesh on 10/3.    PT Comments    Pt with abdominal pain and loose BMs limiting mobility today.  Pt needed peri hygiene x2 throughout session.  Due to pt's pain and continued need for 2 person A for mobility feel pt wouild be safest with D/C to SNF for further therapies and continued nsg care at D/C.    Follow Up Recommendations  SNF     Equipment Recommendations  None recommended by PT    Recommendations for Other Services       Precautions / Restrictions Precautions Precautions: Fall Precaution Comments: abdominal incision and JP drain R LQ Restrictions Weight Bearing Restrictions: No    Mobility  Bed Mobility Overal bed mobility: Needs Assistance;+2 for physical assistance Bed Mobility: Rolling;Sidelying to Sit Rolling: Mod assist Sidelying to sit: Mod assist       General bed mobility comments: cues for log roll technique with coming to sitting to decrease strain on abdomen.  Multiple rolls needed prior to coming to sitting due to loose BM in bed.    Transfers Overall transfer level: Needs assistance Equipment used: 2 person hand held assist Transfers: Sit to/from [facility] Sit to Stand: Mod assist;+2 physical assistance Stand pivot transfers: Mod assist;+2 physical assistance       General transfer comment: pt required 2 person A for SPT to 3-in-1 and then to recliner.  pt needs total A for hygiene after further BM on 3-in-1.  cues for UE use and taking steps through pivot transfers.    Ambulation/Gait                  Stairs            Wheelchair Mobility    Modified Rankin (Stroke Patients Only)       Balance Overall balance assessment: Needs assistance Sitting-balance support: Bilateral upper extremity supported;Feet supported Sitting balance-Leahy Scale: Poor     Standing balance support: Bilateral upper extremity supported;During functional activity Standing balance-Leahy Scale: Poor                      Cognition Arousal/Alertness: Awake/alert Behavior During Therapy: Anxious Overall Cognitive Status: Within Functional Limits for tasks assessed                      Exercises      General Comments        Pertinent Vitals/Pain Pain Assessment: Faces Faces Pain Scale: Hurts whole lot Pain Location: Abdomen and back Pain Descriptors / Indicators: Aching;Grimacing;Guarding Pain Intervention(s): Monitored during session;Premedicated before session;Repositioned    Home Living                      Prior Function            PT Goals (current goals can now be found in the care plan section) Acute Rehab PT Goals Patient Stated Goal: decrease her low back pain, go home at discharge PT Goal Formulation: With patient/family Time For Goal Achievement: 01/08/16 Potential to Achieve Goals: Good Progress towards  PT goals: Progressing toward goals    Frequency    Min 3X/week      PT Plan Current plan remains appropriate    Co-evaluation             End of Session Equipment Utilized During Treatment: Gait belt Activity Tolerance: Patient limited by fatigue;Patient limited by pain Patient left: in chair;with call bell/phone within reach;with family/visitor present;with nursing/sitter in room     Time: [id number] PT Time Calculation (min) (ACUTE ONLY): 39 min  Charges:  $Therapeutic Activity: 38-52 mins                    G CodesCatarina Hartshorn, [place] 12/27/2015, 3:39 PM

## 2015-12-27 NOTE — Progress Notes (Signed)
PROGRESS NOTE  Monique Russo [id number] DOB: 07-14-1930 DOA: 12/17/2015 PCP: Horatio Pel, MD   LOS: 9 days   Brief Narrative: Monique Russo a 82 y.o.femalewith a past medical history significant for chronic pain on methadone and chronic non-healing surgical woundwho presented with abdominal pain and vomiting for 3 days. The patient had a perforated ulcer 1 year ago requiring [name] patch.This surgery was unfortunately complicated by nonhealing surgical wound (because of 82 year old infected hernia repair mesh?), Which for the last year the patient has been having wound care by her general surgeon in the office every six weeks (per family). This week, two days prior to admission, the patient had an office debridement and described that Dr. Excell Seltzer did his usual debridement, after which she began to develop nausea and LLQ abdominal pain. This crampy, severe, LLQ aching abdominal pain continued intermittently and was associated with several episodes of nonbloody emesis and obstipation. On the morning of admission, she was recommended by surgery office for ER evaluation. CT of the abdomen and pelvis was obtained that showed small bowel obstruction with transition point in the right mid abdomenand stranding around the colon, consistent with diverticulitis. Surgery was consulted and patient was admitted for further workup. Patient's condition did not improve and she underwent ExLap, LOA, removal of prior mesh and complex abdominal wall closure and hernia repair with biologic mesh on 10/3.  Assessment & Plan: Principal Problem:   Small bowel obstruction Active Problems:   Hyponatremia   Chronic pain syndrome   Diverticulitis of large intestine without perforation or abscess without bleeding   SBO (small bowel obstruction)   Small bowel obstruction - Per patient, had a BM 10/2, however abdominal x-ray 10/3 showed persistent high-grade small bowel obstruction - general  surgery, following, patient underwent ExLap, LOA, removal of prior mesh and complex abdominal wall closure and hernia repair with biologic mesh on 10/3. - continues to have BMs, tolerating a diet. Closely watch for diarrhea as she is at risk for C diff  Diverticulitis - There is stranding around the colon that suggests diverticulitis. - was on Ciprofloxacin and Flagyl IV for 8 days. Now on Zosyn day 3/5  Aspiration PNA vs HCAP - SLP evaluation ordered, discussed at length with daughter, she understands aspiration risk - CXR with developing PNA - started Zosyn which will have good intra-abdominal coverage as well, today day 3/5 - patient complains of productive cough over the past day  Hyponatremia - Improving - on TPN  Chronic pain syndrome - resumed methadone, space out dilaudid - pain improved now.   GERD and hiatal hernia - Replace PPI with IV famotidine while NPO  - DG barium esophagus showed marked lower esophageal tortuosity with large hiatal hernia but no mass. For definitive imaging, patient will need EGD but this can be done at a later date. This may contribute to her aspiration / reflux, discussed with daughter at bedside 10/6   Acute hypoxic respiratory failure - post op requiring 4L [place] - CXR without overt pulmonary edema 10/4 however developing infiltrate 10/7. Abx as above - closely monitor respiratory status    DVT prophylaxis: SCD Code Status: Full Family Communication: d/w daughter at bedside Disposition Plan: remain inpatient  Consultants:   General surgery  Procedures:   ExLap, LOA, removal of prior mesh and complex abdominal wall closure and hernia repair with biologic mesh on 10/3.  Foley: yes  Antimicrobials:  Ciprofloxacin 9/30 >> 10/7  Flagyl 9/30 >> 10/7  Zosyn 10/7 >>  Subjective: -  drowsy but wakes up quicker than yesterday. Denies abdominal pain.  Objective: Vitals:   12/26/15 0551 12/26/15 1501 12/26/15 2202 12/27/15 0624  BP:   129/63 124/68 130/65  Pulse: (!) 102 89 82 84  Resp: 20 18 18 20   Temp: 98.8 F (37.1 C) (!) 100.5 F (38.1 C) 99.5 F (37.5 C) 99.3 F (37.4 C)  TempSrc: Oral Oral Oral Oral  SpO2: 97% 100% 100% 100%  Weight:      Height:        Intake/Output Summary (Last 24 hours) at 12/27/15 1252 Last data filed at 12/27/15 0900  Gross per 24 hour  Intake              800 ml  Output             1915 ml  Net            -1115 ml   Filed Weights   12/17/15 2249 12/18/15 1238  Weight: 73 kg (161 lb) 72.6 kg (160 lb)    Examination: Constitutional: in pain,appears distressed Vitals:   12/26/15 0551 12/26/15 1501 12/26/15 2202 12/27/15 0624  BP:  129/63 124/68 130/65  Pulse: (!) 102 89 82 84  Resp: 20 18 18 20   Temp: 98.8 F (37.1 C) (!) 100.5 F (38.1 C) 99.5 F (37.5 C) 99.3 F (37.4 C)  TempSrc: Oral Oral Oral Oral  SpO2: 97% 100% 100% 100%  Weight:      Height:       Eyes: PERRL Neck: normal, supple Respiratory: no wheezing, gurgling sounds Cardiovascular: Regular rate and rhythm, no murmurs / rubs / gallops. No LE edema. Abdomen: dressing in place, C/D/I. Surgical site with possible incipient dehiscence. Musculoskeletal: no clubbing / cyanosis. Neurologic: non focal    Data Reviewed: I have personally reviewed following labs and imaging studies  CBC:  Recent Labs Lab 12/22/15 0728 12/23/15 0500 12/24/15 0500 12/25/15 0500 12/27/15 0313  WBC 21.7* 19.8* 15.8* 14.8* 19.7*  NEUTROABS  --   --  11.7*  --  15.1*  HGB 11.2* 9.6* 8.6* 8.6* 8.1*  HCT 34.4* 30.2* 26.5* 27.0* 25.2*  MCV 95.6 95.6 95.0 95.1 92.6  PLT 410* 405* 393 394 [id number]   Basic Metabolic Panel:  Recent Labs Lab 12/23/15 0500 12/24/15 0500 12/25/15 0500 12/26/15 1100 12/27/15 0313  NA 139 139 138 131* 132*  K 3.1* 3.6 3.8 3.8 4.3  CL 104 105 108 102 102  CO2 22 26 25 23 25   GLUCOSE 95 147* 125* 139* 113*  BUN 5* 6 8 9 12   CREATININE 0.77 0.60 0.57 0.54 0.57  CALCIUM 8.4* 8.7* 8.6* 8.3*  8.2*  MG  --  1.7 2.0 1.8 2.1  PHOS  --  1.0* 2.9 2.6 3.3   GFR: Estimated Creatinine Clearance: 47.4 mL/min (by C-G formula based on SCr of 0.57 mg/dL). Liver Function Tests:  Recent Labs Lab 12/23/15 0500 12/24/15 0500 12/25/15 0500 12/27/15 0313  AST 14* 14* 11* 14*  ALT 9* 9* 7* 7*  ALKPHOS 57 44 41 62  BILITOT 0.8 0.4 0.3 0.2*  PROT 4.8* 4.8* 4.9* 4.6*  ALBUMIN 1.7* 1.6* 1.6* 1.5*   CBG:  Recent Labs Lab 12/26/15 1239 12/26/15 1735 12/27/15 0028 12/27/15 0621 12/27/15 1230  GLUCAP 150* 144* 122* 135* 115*   Lipid Profile:  Recent Labs  12/27/15 0313  TRIG 64   Thyroid Function Tests: No results for input(s): TSH, T4TOTAL, FREET4, T3FREE, THYROIDAB in the last 72 hours. Anemia Panel:  No results for input(s): VITAMINB12, FOLATE, FERRITIN, TIBC, IRON, RETICCTPCT in the last 72 hours. Urine analysis:    Component Value Date/Time   COLORURINE AMBER (A) 12/18/2015 1507   APPEARANCEUR CLEAR 12/18/2015 1507   LABSPEC >1.046 (H) 12/18/2015 1507   PHURINE 5.5 12/18/2015 1507   GLUCOSEU NEGATIVE 12/18/2015 1507   HGBUR NEGATIVE 12/18/2015 1507   BILIRUBINUR SMALL (A) 12/18/2015 1507   KETONESUR NEGATIVE 12/18/2015 1507   PROTEINUR NEGATIVE 12/18/2015 1507   UROBILINOGEN 0.2 08/04/2014 2100   NITRITE NEGATIVE 12/18/2015 1507   LEUKOCYTESUR NEGATIVE 12/18/2015 1507   Sepsis Labs: Invalid input(s): PROCALCITONIN, LACTICIDVEN  Recent Results (from the past 240 hour(s))  Urine culture     Status: None   Collection Time: 12/17/15  3:07 PM  Result Value Ref Range Status   Specimen Description URINE, RANDOM  Final   Special Requests NONE  Final   Culture NO GROWTH  Final   Report Status 12/19/2015 FINAL  Final  Culture, blood (Routine x 2)     Status: None   Collection Time: 12/17/15 11:55 PM  Result Value Ref Range Status   Specimen Description BLOOD RIGHT HAND  Final   Special Requests IN PEDIATRIC BOTTLE 1ML  Final   Culture NO GROWTH 5 DAYS  Final    Report Status 12/23/2015 FINAL  Final  Culture, blood (Routine x 2)     Status: None   Collection Time: 12/17/15 11:59 PM  Result Value Ref Range Status   Specimen Description BLOOD RIGHT ARM  Final   Special Requests BOTTLES DRAWN AEROBIC AND ANAEROBIC 5ML  Final   Culture NO GROWTH 5 DAYS  Final   Report Status 12/23/2015 FINAL  Final  MRSA PCR Screening     Status: None   Collection Time: 12/18/15  2:03 PM  Result Value Ref Range Status   MRSA by PCR NEGATIVE NEGATIVE Final    Comment:        The GeneXpert MRSA Assay (FDA approved for NASAL specimens only), is one component of a comprehensive MRSA colonization surveillance program. It is not intended to diagnose MRSA infection nor to guide or monitor treatment for MRSA infections.       Radiology Studies: No results found.   Scheduled Meds: . enoxaparin (LOVENOX) injection  40 mg Subcutaneous Q24H  . methadone  5 mg Oral Q8H  . multivitamin with minerals  1 tablet Oral Daily  . pantoprazole  40 mg Oral Daily  . piperacillin-tazobactam (ZOSYN)  IV  3.375 g Intravenous Q8H  . timolol  1 drop Both Eyes QHS  . timolol  2 drop Both Eyes Daily   Continuous Infusions: . Marland KitchenTPN (CLINIMIX-E) Adult 65 mL/hr at 12/26/15 1739   And  . fat emulsion 240 mL (12/26/15 1737)  . Marland KitchenTPN (CLINIMIX-E) Adult     And  . fat emulsion      Marzetta Board, MD, PhD [facility] Pager [phone]  If 7PM-7AM, please contact night-coverage [email or web address] Password [email or web address] 12/27/2015, 12:52 PM

## 2015-12-27 NOTE — Consult Note (Addendum)
[place] Nurse wound consult note Reason for Consult: Surgical team following for assessment and plan of care to abd dehisced wound with exposed mesh.  Requested to provide dressing change suggestions. Wound type: Full thickness post-op wound to middle abd Measurement: 11X6X2.2cm; undermining to wound edges to 5 cm when swab inserted. Wound bed: Exposed tan-colored mesh with staples above and below open wound Drainage (amount, consistency, odor) Mod amt reddish drainage, no odor Periwound: Intact skin surrounding, staples well-approximated Dressing procedure/placement/frequency: Mepitel to protect mesh, hydrogel and gauze to promote moist healing. Daily topical treatment orders provided for staff nurses. Please refer to surgical team for further plan of care. Please re-consult if further assistance is needed.  Thank-you,  Julien Girt MSN, [place], [place], [place], [place]

## 2015-12-27 NOTE — Consult Note (Signed)
   [facility] CM Inpatient Consult   12/27/2015  Amelita Whiteaker 1930-11-19 [id number]  Patient screened for potential [place] Management services.   Chart review reveals an 82 y.o. female with a past medical history significant for chronic pain on methadone and chronic non-healing surgical wound who presented with abdominal pain and vomiting for 3 days. Now s/p Exploratory Lap, removal of prior mesh and complex abdominal wall closure and hernia repair with biologic mesh on 10/3. Patient is eligible for [facility] Care Management services under patient's [employer] Medicare plan. Current plan is recommended for a skilled nursing facility stay post hospitalization per Physical Therapy.  This is the patient's 1st admission in the past 6 months.  No current post hospital follow up needs noted for community follow up.  Please place a [facility] Care Management consult if there are changes to the current plan or for questions contact:   Natividad Brood, RN BSN [place] [facility] Liaison  [phone] business mobile phone Toll free office [phone]

## 2015-12-27 NOTE — Progress Notes (Signed)
PARENTERAL NUTRITION CONSULT NOTE - FOLLOW UP  Pharmacy Consult: TPN Indication:  Prolonged ileus  No Known Allergies  Patient Measurements: Height: 5' (152.4 cm) Weight: 160 lb (72.6 kg) IBW/kg (Calculated) : 45.5  Vital Signs: Temp: 99.3 F (37.4 C) (10/09 0624) Temp Source: Oral (10/09 0624) BP: 130/65 (10/09 0624) Pulse Rate: 84 (10/09 0624) Intake/Output from previous day: 10/08 0701 - 10/09 0700 In: 800 [I.V.:600; IV Piggyback:200] Out: 1895 [Urine:1875; Drains:20] Intake/Output from this shift: Total I/O In: -  Out: 10 [Stool:10]  Labs:  Recent Labs  12/25/15 0500 12/27/15 0313  WBC 14.8* 19.7*  HGB 8.6* 8.1*  HCT 27.0* 25.2*  PLT 394 381     Recent Labs  12/25/15 0500 12/26/15 1100 12/27/15 0313  NA 138 131* 132*  K 3.8 3.8 4.3  CL 108 102 102  CO2 25 23 25   GLUCOSE 125* 139* 113*  BUN 8 9 12   CREATININE 0.57 0.54 0.57  CALCIUM 8.6* 8.3* 8.2*  MG 2.0 1.8 2.1  PHOS 2.9 2.6 3.3  PROT 4.9*  --  4.6*  ALBUMIN 1.6*  --  1.5*  AST 11*  --  14*  ALT 7*  --  7*  ALKPHOS 41  --  62  BILITOT 0.3  --  0.2*  PREALBUMIN  --   --  6.9*  TRIG  --   --  64   Estimated Creatinine Clearance: 47.4 mL/min (by C-G formula based on SCr of 0.57 mg/dL).    Recent Labs  12/26/15 1735 12/27/15 0028 12/27/15 0621  GLUCAP 144* 122* 135*     Insulin Requirements in the past 24 hours:  DC SSI and CBGs 10/9  Assessment: [name] s/p ex-lap with LOA, removal of previously placed mesh, complex abdominal wall closure and hernia repair with biologic mesh.  She has been NPO x5 day since admission and >7 days per patient report.  Pharmacy consulted to manage TPN for prolonged ileus.  Per documentation, patient lost 10 lbs over the past week PTA and was eating minimally since 12/15/15.   GI: hx GERD - Pepcid in TPN and on PO PPI. LMB 10/8 prealbumin <5 >> 6.9. Diet advanced to full liquid 10/8, no PO intake recorded. SLP swallow eval not completed 10/8 due to pt  unable to participate.  Endo: no hx DM Lytes: K+ 4.3 after 4 runs k  (goal >/= 4 for ileus), Mag 2.1 after 2 gm mag  (goal >/= 2 for ileus), phos 3.3 and Na 132 after 20 mM Na phos.  Renal: SCr 0.57 stable, CrCL 47 ml/min, BUN WNL Pulm: satting well on 5L [place] Cards: PAH / CHF -(PTA Lasix, Lovaza on hold) Hepatobil: LFTs / tbili / TG WNL Neuro: depression - PTA Cymbalta,  gabapentin on hold. Methadone resumed 10/8 ID: Zosyn D#[id number]/5  for asp PNA, s/p 8d Cipro/Flagyl for diverticulitis - afebrile, WBC up to 19.7, AF Best Practices: Lovenox TPN Access: PICC placed 12/22/15 TPN start date: 12/23/15 Current Nutrition:  TPN Full liquid  Nutritional Goals: 1400-1600 kCal and 65-80 gm protein per day  Plan:  - Continue Clinimix E 5/15 at 65 ml/hr and lipids at 10 ml/hr.  TPN provides 1588 kCal and 78 gm of protein per day, meeting 100% of patient's needs. - change MVI to PO - remove Pepcid 20mg  daily in TPN as pt on PO PPI - DC SSI/CBGs - F/U PO intake/diet advancement to wean off of TPN - F/U AM labs  Eudelia Bunch, Pharm.D. [id number]  12/27/2015 10:04 AM

## 2015-12-27 NOTE — Clinical Social Work Note (Signed)
Clinical Social Work Assessment  Patient Details  Name: Monique Russo MRN: [id number] Date of Birth: January 24, 1931  Date of referral:  12/27/15               Reason for consult:  Facility Placement                Permission sought to share information with:   (fACILITIES) Permission granted to share information::   (Facilities)  Name::        Agency::     Relationship::     Contact Information:     Housing/Transportation Living arrangements for the past 2 months:  Single Family Home Source of Information:  Spouse Patient Interpreter Needed:  None Criminal Activity/Legal Involvement Pertinent to Current Situation/Hospitalization:  No - Comment as needed Significant Relationships:  Spouse Lives with:  Spouse Do you feel safe going back to the place where you live?   (Husband states he is interested in facility for patient.) Need for family participation in patient care:  No (Coment)  Care giving concerns:  SW attempted to speak with pt at bedside. However, she appeared sleepy and confused. Husband was present who states that pt needs assistance with ADL. Husband expressed that he would not be able to effectively care for her at home.   Social Worker assessment / plan:  Husbands states that he would like SW to refer pt to facilities. SW will refer pt. Husband states that prior to coming to hospital the pt had a caregiver x2 a week and who assisted her with ADLs. SW will refer pt to facilities.  Employment status:  Software engineer:   Agricultural engineer) PT Recommendations:  [place] / Referral to community resources:   (SNF)  Patient/Family's Response to care:  Appropriate.   Patient/Family's Understanding of and Emotional Response to Diagnosis, Current Treatment, and Prognosis:  No questions.  Emotional Assessment Appearance:  Appears stated age Attitude/Demeanor/Rapport:   (Appropriate.) Affect (typically observed):   Accepting Orientation:  Oriented to Self, Oriented to Situation Alcohol / Substance use:  Not Applicable Psych involvement (Current and /or in the community):  No (Comment)  Discharge Needs  Concerns to be addressed:  No discharge needs identified Readmission within the last 30 days:  No Current discharge risk:  None Barriers to Discharge:  No Barriers Identified   Bernita Buffy 12/27/2015, 4:45 PM

## 2015-12-27 NOTE — Progress Notes (Signed)
Patient ID: Monique Russo, female   DOB: Jun 06, 1930, 82 y.o.   MRN: [id number]  [facility] Surgery Progress Note  6 Days Post-Op  Subjective: Tired, did not sleep well last night. Per husband he feels like her pain has been a little less since restarting methadone. Tolerating full liquids. No n/v. Husband reports she has had multiple loose BM's since yesterday.  Objective: Vital signs in last 24 hours: Temp:  [99.3 F (37.4 C)-100.5 F (38.1 C)] 99.3 F (37.4 C) (10/09 0624) Pulse Rate:  [82-89] 84 (10/09 0624) Resp:  [18-20] 20 (10/09 0624) BP: (124-130)/(63-68) 130/65 (10/09 0624) SpO2:  [100 %] 100 % (10/09 0624) Last BM Date: 12/26/15  Intake/Output from previous day: 10/08 0701 - 10/09 0700 In: 800 [I.V.:600; IV Piggyback:200] Out: 1895 [Urine:1875; Drains:20] Intake/Output this shift: Total I/O In: -  Out: 40 [Drains:30; Stool:10]  PE: Gen:  sleeping Pulm:  No wheezing Abd: dry dressing in place (just changed this morning), soft, +BS  RLQ drain 20cc/24hr  Lab Results:   Recent Labs  12/25/15 0500 12/27/15 0313  WBC 14.8* 19.7*  HGB 8.6* 8.1*  HCT 27.0* 25.2*  PLT 394 381   BMET  Recent Labs  12/26/15 1100 12/27/15 0313  NA 131* 132*  K 3.8 4.3  CL 102 102  CO2 23 25  GLUCOSE 139* 113*  BUN 9 12  CREATININE 0.54 0.57  CALCIUM 8.3* 8.2*   PT/INR No results for input(s): LABPROT, INR in the last 72 hours. CMP     Component Value Date/Time   NA 132 (L) 12/27/2015 0313   K 4.3 12/27/2015 0313   CL 102 12/27/2015 0313   CO2 25 12/27/2015 0313   GLUCOSE 113 (H) 12/27/2015 0313   BUN 12 12/27/2015 0313   CREATININE 0.57 12/27/2015 0313   CALCIUM 8.2 (L) 12/27/2015 0313   PROT 4.6 (L) 12/27/2015 0313   ALBUMIN 1.5 (L) 12/27/2015 0313   AST 14 (L) 12/27/2015 0313   ALT 7 (L) 12/27/2015 0313   ALKPHOS 62 12/27/2015 0313   BILITOT 0.2 (L) 12/27/2015 0313   GFRNONAA >60 12/27/2015 0313   GFRAA >60 12/27/2015 0313   Lipase      Component Value Date/Time   LIPASE 12 (L) 08/06/2014 2020       Studies/Results: Dg Chest Port 1 View  Result Date: 12/25/2015 CLINICAL DATA:  Productive cough and shortness of breath. Surgery on 12/22/2015 for infected ventral hernia mesh and small bowel obstruction. EXAM: PORTABLE CHEST 1 VIEW COMPARISON:  CT of the abdomen 12/18/2015 and esophagram on 12/20/2015. FINDINGS: Chronic opacity in the right lower chest likely relates to the known large hiatal/paraesophageal hernia. There may be a developing subtle infiltrate in the left lower lung. Lung volumes are low. The heart size is stable. PICC line tip is in the SVC. IMPRESSION: Possible developing left lower lung infiltrate. Opacity in the right chest likely relates to the known large hiatal/paraesophageal hernia. Electronically Signed   By: Aletta Edouard M.D.   On: 12/25/2015 11:41    Anti-infectives: Anti-infectives    Start     Dose/Rate Route Frequency Ordered Stop   12/25/15 1300  piperacillin-tazobactam (ZOSYN) IVPB 3.375 g     3.375 g 12.5 mL/hr over 240 Minutes Intravenous Every 8 hours 12/25/15 1218     12/20/15 1000  ciprofloxacin (CIPRO) IVPB 400 mg  Status:  Discontinued     400 mg 200 mL/hr over 60 Minutes Intravenous 2 times daily 12/20/15 0907 12/25/15 1151  12/20/15 0945  metroNIDAZOLE (FLAGYL) IVPB 500 mg  Status:  Discontinued     500 mg 100 mL/hr over 60 Minutes Intravenous Every 8 hours 12/20/15 0907 12/25/15 1151   12/18/15 0430  ciprofloxacin (CIPRO) IVPB 400 mg  Status:  Discontinued     400 mg 200 mL/hr over 60 Minutes Intravenous 2 times daily 12/18/15 0427 12/20/15 0904   12/18/15 0430  metroNIDAZOLE (FLAGYL) IVPB 500 mg  Status:  Discontinued     500 mg 100 mL/hr over 60 Minutes Intravenous Every 8 hours 12/18/15 0427 12/20/15 0904   12/18/15 0345  piperacillin-tazobactam (ZOSYN) IVPB 3.375 g  Status:  Discontinued     3.375 g 100 mL/hr over 30 Minutes Intravenous  Once 12/18/15 0330 12/18/15 0427        Assessment/Plan S/p procedures: 1. Exploratory laparotomy. 2. Lysis of adhesions. 3. Removal of previously placed mesh. 4. Complex abdominal wall closure and hernia repair with biologic mesh (20 cm x 25 cm Phasix). -- 12/21/15 Dr. Ninfa Linden - POD 6 - on full liquids and TNA - multiple loose BM's yesterday/today - RLQ drain 20cc/24hr - WOC consulted for wound recommendations  ?Diverticulitis - was on cipro/flagyl for 8 days, now zosyn day 3/5 per internal medicine  Hyponatremia - 132, recheck tomorrow Hypokalemia - 4.3 Anemia - Hg 8.1 today, continue to monitor Acute hypoxic respiratory failure - CXR with developing PNA and patient with productive cough, on zosyn. Continue IS. Discussed importance of working with PT with patient Chronic pain - started back on methadone yesterday GERD - protonix Hiatal hernia  ID - was on cipro/flagyl for 8 days, now zosyn day 3/5 per internal medicine FEN - soft, wean TNA VTE - SCD, lovenox   Plan - will not treat diarrhea yet, change diet to soft and see if it resolves. Wean TNA. Discussed importance of mobilization/PT with patient and husband. Continue IS. Wound care per WOC. Continue drain and antibiotics.   LOS: 9 days    Jerrye Beavers , [facility] 12/27/2015, 10:32 AM Pager: [phone] Consults: [phone] Mon-Fri 7:00 am-4:30 pm Sat-Sun 7:00 am-11:30 am

## 2015-12-27 NOTE — Care Management Important Message (Signed)
Important Message  Patient Details  Name: Monique Russo MRN: [id number] Date of Birth: [birth year]   Medicare Important Message Given:  Yes    Jenna Ardoin 12/27/2015, 12:20 PM

## 2015-12-27 NOTE — Evaluation (Signed)
Clinical/Bedside Swallow Evaluation Patient Details  Name: Monique Russo MRN: [id number] Date of Birth: 1931-01-03  Today's Date: 12/27/2015 Time: SLP Start Time (ACUTE ONLY): 71 SLP Stop Time (ACUTE ONLY): 1202 SLP Time Calculation (min) (ACUTE ONLY): 39 min  Past Medical History:  Past Medical History:  Diagnosis Date  . Arthritis   . CHF (congestive heart failure) ([place])   . Depression   . GERD (gastroesophageal reflux disease)   . Perforated chronic gastric ulcer ([place]) 08/11/2014  . Pneumonia   . Pulmonary arterial hypertension 11/28/2014   Past Surgical History:  Past Surgical History:  Procedure Laterality Date  . ABDOMINAL HYSTERECTOMY    . BACK SURGERY  2000  . COLON SURGERY     x2-blockage  . COLONOSCOPY    . ERCP    . HERNIA REPAIR  2012   x2 with mesh  . JOINT REPLACEMENT Left    shoulder  . LAPAROTOMY N/A 08/04/2014   Procedure: EXPLORATORY LAPAROTOMY, REPAIR OF PERFORATED ULCER;  Surgeon: Excell Seltzer, MD;  Location: [facility];  Service: General;  Laterality: N/A;  . LAPAROTOMY N/A 12/21/2015   Procedure: EXPLORATORY LAPAROTOMY/ REMOVAL OF MESH;  Surgeon: Coralie Keens, MD;  Location: [facility];  Service: General;  Laterality: N/A;  . ORIF WRIST FRACTURE  2012   left  . REVERSE SHOULDER ARTHROPLASTY Right 12/04/2013  . REVERSE SHOULDER ARTHROPLASTY  12/04/2013   Procedure: REVERSE SHOULDER ARTHROPLASTY;  Surgeon: Nita Sells, MD;  Location: [facility];  Service: Orthopedics;;  Right reverse total shoulder arthroplasty  . ROTATOR CUFF REPAIR     dr Tamera Punt  . SHOULDER ARTHROSCOPY  2012   lt and rt  . TENDON REPAIR Right 04/13/2014   Procedure: RIGHT HAND CENTRAL TENDON CENTRALIZATION OF LONG AND RING FINGERS;  Surgeon: Jolyn Nap, MD;  Location: [facility];  Service: Orthopedics;  Laterality: Right;   HPI:  Monique Russo a 82 y.o.femalewith a past medical history significant for chronic pain on methadone and chronic  non-healing surgical woundwho presented with abdominal pain and vomiting for 3 days. The patient had a perforated ulcer 1 year ago requiring [name] patch.This surgery was unfortunately complicated by nonhealing surgical wound (because of 80 year old infected hernia repair mesh?), Which for the last year the patient has been having wound care by her general surgeon in the office every six weeks (per family). This week, two days prior to admission, the patient had an office debridement and described that Dr. Excell Seltzer did his usual debridement, after which she began to develop nausea and LLQ abdominal pain. This crampy, severe, LLQ aching abdominal pain continued intermittently and was associated with several episodes of nonbloody emesis and obstipation. On the morning of admission, she was recommended by surgery office for ER evaluation. CT of the abdomen and pelvis was obtained that showed small bowel obstruction with transition point in the right mid abdomenand stranding around the colon, consistent with diverticulitis. Surgery was consulted and patient was admitted for further workup. Patient's condition did not improve and she underwent ExLap, LOA, removal of prior mesh and complex abdominal wall closure and hernia repair with biologic mesh on 10/3.  Pt is on a soft diet, but there has been concern for aspiration pna. CXR 10/7 shows Possible developing left lower lung infiltrate. Opacity in the right chest likely relates to the known large hiatal/paraesophageal hernia. Esophagram on 10/2 shows torturous distal esophagus.    Assessment / Plan / Recommendation Clinical Impression  Pt demonstrates multiple risk factors  for aspiration including refusal/inability to sit upright for meals and need for pain medication that can impact arousal. Pts husband reports she typically spends 80% of the day in the bed and sits on the edge of the bed to drink or take meds, gets out of bed for meals. However since her  surgery and this admission, he has been feeding her at about 20 degress incline. SLP attempted to reposition pt but she cried out at more than 45 degrees. No immediate signs and symptoms of aspiration or swallow impairment observed, though pt did have a cough 1-2 minutes after intake. She has a congested cough so difficult to determine if this is associated with aspiration. GIven lack of cause for sensory impairment, would think silent aspriation is unlikely. Pt may be having some primary esophageal dysphagia with risk of post prandial aspiration given dx of hiatal hernia and abnormality in the distal esophagus. Educated pt and husband to risk and instructed him on how to reposition pt to decrease risk. Will plan to f/u while pt is having a meal for further assessment as intake was limited today.     Aspiration Risk  Moderate aspiration risk    Diet Recommendation Dysphagia 3 (Mech soft);Thin liquid   Liquid Administration via: Cup;Straw Medication Administration: Whole meds with liquid Supervision: Staff to assist with self feeding;Full supervision/cueing for compensatory strategies Compensations: Slow rate;Small sips/bites Postural Changes: Seated upright at 90 degrees;Remain upright for at least 30 minutes after po intake    Other  Recommendations     Follow up Recommendations 24 hour supervision/assistance      Frequency and Duration min 2x/week  2 weeks       Prognosis        Swallow Study   General HPI: Monique Russo a 82 y.o.femalewith a past medical history significant for chronic pain on methadone and chronic non-healing surgical woundwho presented with abdominal pain and vomiting for 3 days. The patient had a perforated ulcer 1 year ago requiring [name] patch.This surgery was unfortunately complicated by nonhealing surgical wound (because of 80 year old infected hernia repair mesh?), Which for the last year the patient has been having wound care by her general surgeon  in the office every six weeks (per family). This week, two days prior to admission, the patient had an office debridement and described that Dr. Excell Seltzer did his usual debridement, after which she began to develop nausea and LLQ abdominal pain. This crampy, severe, LLQ aching abdominal pain continued intermittently and was associated with several episodes of nonbloody emesis and obstipation. On the morning of admission, she was recommended by surgery office for ER evaluation. CT of the abdomen and pelvis was obtained that showed small bowel obstruction with transition point in the right mid abdomenand stranding around the colon, consistent with diverticulitis. Surgery was consulted and patient was admitted for further workup. Patient's condition did not improve and she underwent ExLap, LOA, removal of prior mesh and complex abdominal wall closure and hernia repair with biologic mesh on 10/3.  Pt is on a soft diet, but there has been concern for aspiration pna. CXR 10/7 shows Possible developing left lower lung infiltrate. Opacity in the right chest likely relates to the known large hiatal/paraesophageal hernia. Esophagram on 10/2 shows torturous distal esophagus.  Type of Study: Bedside Swallow Evaluation Previous Swallow Assessment: none Diet Prior to this Study: Dysphagia 3 (soft);Thin liquids Temperature Spikes Noted: Yes Respiratory Status: Room air History of Recent Intubation: No Behavior/Cognition: Alert Oral Care Completed by  SLP: No Oral Cavity - Dentition: Missing dentition;Poor condition Vision: Functional for self-feeding Self-Feeding Abilities: Needs assist Patient Positioning: Partially reclined Baseline Vocal Quality: Normal Volitional Cough: Congested Volitional Swallow: Able to elicit    Oral/Motor/Sensory Function Overall Oral Motor/Sensory Function: Within functional limits   Ice Chips     Thin Liquid Thin Liquid: Within functional limits Presentation: Cup;Straw    Nectar  Thick Nectar Thick Liquid: Not tested   Honey Thick Honey Thick Liquid: Not tested   Puree Puree: Not tested   Solid   GO   Solid: Not tested       Herbie [place], MA CCC-SLP [id number]  Monique Russo, Katherene Ponto 12/27/2015,12:54 PM

## 2015-12-27 NOTE — NC FL2 (Signed)
[place] LEVEL OF CARE SCREENING TOOL     IDENTIFICATION  Patient Name: Monique Russo Birthdate: [birth year] Sex: female Admission Date (Current Location): 12/17/2015  [place] [facility] and [place] Number:  Herbalist and Address:  [facility] [place] [place] [address], [place], [place] [address]      Provider Number: [id number]  Attending Physician Name and Address:  Caren Griffins, MD  Relative Name and Phone Number:       Current Level of Care: Hospital Recommended Level of Care: [place] Prior Approval Number:    Date Approved/Denied:   PASRR Number:    Discharge Plan: SNF    Current Diagnoses: Patient Active Problem List   Diagnosis Date Noted  . Small bowel obstruction 12/18/2015  . Diverticulitis of large intestine without perforation or abscess without bleeding 12/18/2015  . SBO (small bowel obstruction) 12/18/2015  . Left ventricular dysfunction  reported in chart, not confirmed on review of echo 11/28/2014  . Pulmonary arterial hypertension 11/28/2014  . Perforated chronic gastric ulcer ([place]) 08/11/2014  . Encounter for intubation   . Chronic pain syndrome   . Hyponatremia 07/17/2014  . DNR (do not resuscitate) 07/07/2013  . Palliative care encounter 07/07/2013  . Narcotic overdose 07/03/2013    Orientation RESPIRATION BLADDER Height & Weight     Self, Situation  Normal Continent Weight: 160 lb (72.6 kg) Height:  5' (152.4 cm)  BEHAVIORAL SYMPTOMS/MOOD NEUROLOGICAL BOWEL NUTRITION STATUS      Incontinent    AMBULATORY STATUS COMMUNICATION OF NEEDS Skin   Extensive Assist Verbally Normal                       Personal Care Assistance Level of Assistance  Bathing, Dressing Bathing Assistance: Maximum assistance   Dressing Assistance: Maximum assistance     Functional Limitations Info             SPECIAL CARE FACTORS FREQUENCY                       Contractures       Additional Factors Info   (Full)               Current Medications (12/27/2015):  This is the current hospital active medication list Current Facility-Administered Medications  Medication Dose Route Frequency Provider Last Rate Last Dose  . [place] KitchenTPN (CLINIMIX-E) Adult   Intravenous Continuous TPN Tyrone Apple, RPH 65 mL/hr at 12/26/15 1739    . [place] KitchenTPN (CLINIMIX-E) Adult   Intravenous Continuous TPN Costin Karlyne Greenspan, MD       And  . fat emulsion 20 % infusion 240 mL  240 mL Intravenous Continuous TPN Costin Karlyne Greenspan, MD      . enoxaparin (LOVENOX) injection 40 mg  40 mg Subcutaneous Q24H Jerrye Beavers, PA-C   40 mg at 12/27/15 1137  . HYDROmorphone (DILAUDID) injection 0.5 mg  0.5 mg Intravenous Q4H PRN Caren Griffins, MD   0.5 mg at 12/27/15 1434  . ketorolac (TORADOL) 15 MG/ML injection 7.5 mg  7.5 mg Intravenous Q8H PRN Darci Current Simaan, PA-C   7.5 mg at 12/27/15 0940  . lactose free nutrition (BOOST PLUS) liquid 237 mL  237 mL Oral TID WC Jill Alexanders, PA-C      . LORazepam (ATIVAN) injection 1 mg  1 mg Intravenous Q8H PRN Merton Border, MD   1 mg at 12/27/15 0940  .  methadone (DOLOPHINE) tablet 5 mg  5 mg Oral Q8H Costin Karlyne Greenspan, MD   5 mg at 12/27/15 1423  . methocarbamol (ROBAXIN) 500 mg in dextrose 5 % 50 mL IVPB  500 mg Intravenous Q6H PRN Jill Alexanders, PA-C   500 mg at 12/27/15 0556  . multivitamin with minerals tablet 1 tablet  1 tablet Oral Daily Costin Karlyne Greenspan, MD      . ondansetron [facility]) injection 4 mg  4 mg Intravenous Q6H PRN Edwin Dada, MD   4 mg at 12/26/15 0035  . pantoprazole (PROTONIX) EC tablet 40 mg  40 mg Oral Daily Fanny Skates, MD   40 mg at 12/27/15 1134  . piperacillin-tazobactam (ZOSYN) IVPB 3.375 g  3.375 g Intravenous Q8H Tyrone Apple, RPH   3.375 g at 12/27/15 0554  . sodium chloride flush (NS) 0.9 % injection 10 mL  10 mL Intravenous PRN Tyrone Apple, RPH   10 mL at 12/27/15 0313  . sodium chloride flush (NS) 0.9 % injection  10-40 mL  10-40 mL Intracatheter PRN Caren Griffins, MD   10 mL at 12/24/15 0415  . timolol (TIMOPTIC) 0.5 % ophthalmic solution 1 drop  1 drop Both Eyes QHS Ripudeep K Rai, MD   1 drop at 12/26/15 2121  . timolol (TIMOPTIC) 0.5 % ophthalmic solution 2 drop  2 drop Both Eyes Daily Ripudeep Krystal Eaton, MD   2 drop at 12/27/15 1137     Discharge Medications: Please see discharge summary for a list of discharge medications.  Relevant Imaging Results:  Relevant Lab Results:   Additional Information    Venetia Maxon, [place] R

## 2015-12-28 LAB — CBC
HEMATOCRIT: 23.8 % — AB (ref 36.0–46.0)
HEMOGLOBIN: 7.9 g/dL — AB (ref 12.0–15.0)
MCH: 30.2 pg (ref 26.0–34.0)
MCHC: 33.2 g/dL (ref 30.0–36.0)
MCV: 90.8 fL (ref 78.0–100.0)
Platelets: 385 10*3/uL (ref 150–400)
RBC: 2.62 MIL/uL — ABNORMAL LOW (ref 3.87–5.11)
RDW: 14.5 % (ref 11.5–15.5)
WBC: 19.4 10*3/uL — ABNORMAL HIGH (ref 4.0–10.5)

## 2015-12-28 LAB — GLUCOSE, CAPILLARY
GLUCOSE-CAPILLARY: 127 mg/dL — AB (ref 65–99)
GLUCOSE-CAPILLARY: 143 mg/dL — AB (ref 65–99)

## 2015-12-28 LAB — BASIC METABOLIC PANEL
ANION GAP: 7 (ref 5–15)
BUN: 12 mg/dL (ref 6–20)
CALCIUM: 8.3 mg/dL — AB (ref 8.9–10.3)
CO2: 24 mmol/L (ref 22–32)
CREATININE: 0.56 mg/dL (ref 0.44–1.00)
Chloride: 99 mmol/L — ABNORMAL LOW (ref 101–111)
Glucose, Bld: 123 mg/dL — ABNORMAL HIGH (ref 65–99)
Potassium: 4.7 mmol/L (ref 3.5–5.1)
Sodium: 130 mmol/L — ABNORMAL LOW (ref 135–145)

## 2015-12-28 LAB — PHOSPHORUS: PHOSPHORUS: 3.8 mg/dL (ref 2.5–4.6)

## 2015-12-28 LAB — MAGNESIUM: Magnesium: 1.9 mg/dL (ref 1.7–2.4)

## 2015-12-28 MED ORDER — CLINIMIX E/DEXTROSE (5/15) 5 % IV SOLN
INTRAVENOUS | Status: AC
Start: 2015-12-28 — End: 2015-12-29
  Administered 2015-12-28: 17:00:00 via INTRAVENOUS
  Filled 2015-12-28: qty 960

## 2015-12-28 MED ORDER — ALTEPLASE 2 MG IJ SOLR
2.0000 mg | Freq: Once | INTRAMUSCULAR | Status: AC
  Administered 2015-12-28: 2 mg

## 2015-12-28 MED ORDER — TEMAZEPAM 15 MG PO CAPS: 15.0000 mg | ORAL_CAPSULE | Freq: Every evening | ORAL | Status: DC | PRN

## 2015-12-28 MED ORDER — FAT EMULSION 20 % IV EMUL
240.0000 mL | INTRAVENOUS | Status: AC
  Administered 2015-12-28: 240 mL via INTRAVENOUS
  Filled 2015-12-28: qty 250

## 2015-12-28 NOTE — Progress Notes (Signed)
SLP Cancellation Note  Patient Details Name: Monique Russo MRN: [id number] DOB: Oct 21, 1930   Cancelled treatment:       Reason Eval/Treat Not Completed: Other (comment). Pt refused any PO. Reinforced precautions with family.    [name], Katherene Ponto 12/28/2015, 2:01 PM

## 2015-12-28 NOTE — Progress Notes (Signed)
PROGRESS NOTE  Monique Russo [id number] DOB: 04-Nov-1930 DOA: 12/17/2015 PCP: Horatio Pel, MD   LOS: 10 days   Brief Narrative: Monique Russo a 82 y.o.femalewith a past medical history significant for chronic pain on methadone and chronic non-healing surgical woundwho presented with abdominal pain and vomiting for 3 days. The patient had a perforated ulcer 1 year ago requiring [name] patch.This surgery was unfortunately complicated by nonhealing surgical wound (because of 80 year old infected hernia repair mesh?), Which for the last year the patient has been having wound care by her general surgeon in the office every six weeks (per family). This week, two days prior to admission, the patient had an office debridement and described that Dr. Excell Seltzer did his usual debridement, after which she began to develop nausea and LLQ abdominal pain. This crampy, severe, LLQ aching abdominal pain continued intermittently and was associated with several episodes of nonbloody emesis and obstipation. On the morning of admission, she was recommended by surgery office for ER evaluation. CT of the abdomen and pelvis was obtained that showed small bowel obstruction with transition point in the right mid abdomenand stranding around the colon, consistent with diverticulitis. Surgery was consulted and patient was admitted for further workup. Patient's condition did not improve and she underwent ExLap, LOA, removal of prior mesh and complex abdominal wall closure and hernia repair with biologic mesh on 10/3. Postoperative course was initially complicated by mild ileus, and patient needed to be started on TPN, however she is improving now, and her diet is advanced. On 10/9, patient started developing significant watery diarrhea requiring flexi-seal. C diff is checked and returned negative.  Assessment & Plan: Principal Problem:   Small bowel obstruction Active Problems:   Hyponatremia   Chronic  pain syndrome   Diverticulitis of large intestine without perforation or abscess without bleeding   SBO (small bowel obstruction)   Small bowel obstruction - Per patient, had a BM 10/2, however abdominal x-ray 10/3 showed persistent high-grade small bowel obstruction - general surgery, following, patient underwent ExLap, LOA, removal of prior mesh and complex abdominal wall closure and hernia repair with biologic mesh on 10/3. - continues to have BMs, tolerating a diet.   Diarrhea - Patient developed significant amounts of watery diarrhea on 10/9, requiring a Flexi Seal placement, C. difficile was checked and it returned negative - Advance diet, closely monitor electrolytes  Diverticulitis - There is stranding around the colon that suggests diverticulitis. - was on Ciprofloxacin and Flagyl IV for 8 days. Now on Zosyn day 4/5  Aspiration PNA vs HCAP - SLP evaluation ordered, discussed at length with daughter, she understands aspiration risk - CXR with developing PNA - started Zosyn which will have good intra-abdominal coverage as well, today day 4/5 - patient complains of productive cough on 10/7, improving now  Hyponatremia - Improving - on TPN  Chronic pain syndrome - resumed methadone, space out dilaudid - pain improved now.  - Discussed with social worker regarding methadone and fact that almost all skilled nursing facilities will not take a patient on methadone and she will clearly require SNF level care. May need to call palliative on Wednesday 10/11 am to see if they can assist switching her chronic pain regimen from methadone into something that SNF is able to manage. She does not seem to be a candidate for LTAC per case manager  GERD and hiatal hernia - Replace PPI with IV famotidine while NPO  - DG barium esophagus showed marked lower esophageal tortuosity with  large hiatal hernia but no mass. For definitive imaging, patient will need EGD but this can be done at a later  date. This may contribute to her aspiration / reflux, discussed with daughter at bedside 10/6 That she will need close outpatient gastroenterology follow-up once her acute illness is resolved  Acute hypoxic respiratory failure - post op requiring 4L [place] - CXR without overt pulmonary edema 10/4 however developing infiltrate 10/7. Abx as above - closely monitor respiratory status    DVT prophylaxis: SCD Code Status: Full Family Communication: d/w daughter at bedside Disposition Plan: remain inpatient  Consultants:   General surgery  Procedures:   ExLap, LOA, removal of prior mesh and complex abdominal wall closure and hernia repair with biologic mesh on 10/3.  Foley: yes  Antimicrobials:  Ciprofloxacin 9/30 >> 10/7  Flagyl 9/30 >> 10/7  Zosyn 10/7 >>  Subjective: - She appears much more alert for me today, complains of pain in her abdomen as well as lower extremities.  Objective: Vitals:   12/27/15 1756 12/27/15 2257 12/28/15 0600 12/28/15 1508  BP: (!) 143/76 (!) 123/58 (!) 116/54 93/67  Pulse: 88 100 100 98  Resp:  19 18 18   Temp: 99 F (37.2 C) 99.6 F (37.6 C) 99.4 F (37.4 C) 98.8 F (37.1 C)  TempSrc: Oral Oral Oral Oral  SpO2: 100% 100% 100% 100%  Weight:      Height:        Intake/Output Summary (Last 24 hours) at 12/28/15 1709 Last data filed at 12/28/15 1600  Gross per 24 hour  Intake             1300 ml  Output             1660 ml  Net             -360 ml   Filed Weights   12/17/15 2249 12/18/15 1238  Weight: 73 kg (161 lb) 72.6 kg (160 lb)    Examination: Constitutional: in pain,appears distressed Vitals:   12/27/15 1756 12/27/15 2257 12/28/15 0600 12/28/15 1508  BP: (!) 143/76 (!) 123/58 (!) 116/54 93/67  Pulse: 88 100 100 98  Resp:  19 18 18   Temp: 99 F (37.2 C) 99.6 F (37.6 C) 99.4 F (37.4 C) 98.8 F (37.1 C)  TempSrc: Oral Oral Oral Oral  SpO2: 100% 100% 100% 100%  Weight:      Height:       Eyes: PERRL Neck: normal,  supple Respiratory: no wheezing, gurgling sounds Cardiovascular: Regular rate and rhythm, no murmurs / rubs / gallops. No LE edema. Abdomen: dressing in place, C/D/I Musculoskeletal: no clubbing / cyanosis. Neurologic: non focal    Data Reviewed: I have personally reviewed following labs and imaging studies  CBC:  Recent Labs Lab 12/23/15 0500 12/24/15 0500 12/25/15 0500 12/27/15 0313 12/28/15 1030  WBC 19.8* 15.8* 14.8* 19.7* 19.4*  NEUTROABS  --  11.7*  --  15.1*  --   HGB 9.6* 8.6* 8.6* 8.1* 7.9*  HCT 30.2* 26.5* 27.0* 25.2* 23.8*  MCV 95.6 95.0 95.1 92.6 90.8  PLT 405* 393 394 381 0000000   Basic Metabolic Panel:  Recent Labs Lab 12/24/15 0500 12/25/15 0500 12/26/15 1100 12/27/15 0313 12/28/15 0544  NA 139 138 131* 132* 130*  K 3.6 3.8 3.8 4.3 4.7  CL 105 108 102 102 99*  CO2 26 25 23 25 24   GLUCOSE 147* 125* 139* 113* 123*  BUN 6 8 9 12 12   CREATININE 0.60  0.57 0.54 0.57 0.56  CALCIUM 8.7* 8.6* 8.3* 8.2* 8.3*  MG 1.7 2.0 1.8 2.1 1.9  PHOS 1.0* 2.9 2.6 3.3 3.8   GFR: Estimated Creatinine Clearance: 47.4 mL/min (by C-G formula based on SCr of 0.56 mg/dL). Liver Function Tests:  Recent Labs Lab 12/23/15 0500 12/24/15 0500 12/25/15 0500 12/27/15 0313  AST 14* 14* 11* 14*  ALT 9* 9* 7* 7*  ALKPHOS 57 44 41 62  BILITOT 0.8 0.4 0.3 0.2*  PROT 4.8* 4.8* 4.9* 4.6*  ALBUMIN 1.7* 1.6* 1.6* 1.5*   CBG:  Recent Labs Lab 12/27/15 0621 12/27/15 1230 12/27/15 1747 12/28/15 0629 12/28/15 1151  GLUCAP 135* 115* 113* 127* 143*   Lipid Profile:  Recent Labs  12/27/15 0313  TRIG 64   Thyroid Function Tests: No results for input(s): TSH, T4TOTAL, FREET4, T3FREE, THYROIDAB in the last 72 hours. Anemia Panel: No results for input(s): VITAMINB12, FOLATE, FERRITIN, TIBC, IRON, RETICCTPCT in the last 72 hours. Urine analysis:    Component Value Date/Time   COLORURINE AMBER (A) 12/18/2015 1507   APPEARANCEUR CLEAR 12/18/2015 1507   LABSPEC >1.046 (H)  12/18/2015 1507   PHURINE 5.5 12/18/2015 1507   GLUCOSEU NEGATIVE 12/18/2015 1507   HGBUR NEGATIVE 12/18/2015 1507   BILIRUBINUR SMALL (A) 12/18/2015 1507   KETONESUR NEGATIVE 12/18/2015 1507   PROTEINUR NEGATIVE 12/18/2015 1507   UROBILINOGEN 0.2 08/04/2014 2100   NITRITE NEGATIVE 12/18/2015 1507   LEUKOCYTESUR NEGATIVE 12/18/2015 1507   Sepsis Labs: Invalid input(s): PROCALCITONIN, LACTICIDVEN  Recent Results (from the past 240 hour(s))  C difficile quick scan w PCR reflex     Status: None   Collection Time: 12/27/15  1:52 PM  Result Value Ref Range Status   C Diff antigen NEGATIVE NEGATIVE Final   C Diff toxin NEGATIVE NEGATIVE Final   C Diff interpretation Negative for C. difficile  Final      Radiology Studies: No results found.   Scheduled Meds: . enoxaparin (LOVENOX) injection  40 mg Subcutaneous Q24H  . lactose free nutrition  237 mL Oral TID WC  . methadone  5 mg Oral Q8H  . multivitamin with minerals  1 tablet Oral Daily  . pantoprazole  40 mg Oral Daily  . piperacillin-tazobactam (ZOSYN)  IV  3.375 g Intravenous Q8H  . timolol  1 drop Both Eyes QHS  . timolol  2 drop Both Eyes Daily   Continuous Infusions: . [place] KitchenTPN (CLINIMIX-E) Adult 65 mL/hr at 12/27/15 1728   And  . fat emulsion 240 mL (12/27/15 1728)  . [place] KitchenTPN (CLINIMIX-E) Adult     And  . fat emulsion      Marzetta Board, MD, PhD [facility] Pager [phone]  If 7PM-7AM, please contact night-coverage [email or web address] Password [email or web address] 12/28/2015, 5:09 PM

## 2015-12-28 NOTE — Progress Notes (Signed)
Nutrition Follow-up  DOCUMENTATION CODES:   Obesity unspecified  INTERVENTION:    Calorie count x 48 hours  Snacks TID  Continue Boost Plus PO TID, each supplement provides 360 kcal and 14 gm protein  NUTRITION DIAGNOSIS:   Inadequate oral intake related to poor appetite as evidenced by per patient/family report.  Ongoing  GOAL:   Patient will meet greater than or equal to 90% of their needs  Met with TPN  MONITOR:   PO intake, Supplement acceptance, Labs, Weight trends  REASON FOR ASSESSMENT:   Calorie Count  ASSESSMENT:   Monique Russo is a 82 y.o. female with a past medical history significant for chronic pain on methadone and chronic non-healing surgical wound who presents with abdominal pain and vomiting for 3 days.  Patient is receiving TPN with Clinimix E 5/15 (currently at 65 ml/h) decreasing to 40 ml/h with new bag today, and lipids @ 10 ml/hr. Will provide 1200 ml, 1162 kcal, and 48 grams protein per day to meet 83% minimum estimated energy needs and 74% minimum estimated protein needs.  Patient sleeping during RD visit. Spoke with her two sisters in the room, who report that patient has been eating minimally. Sisters report that patient has a hernia which limits the amount of food she can eat at one time. Patient typically eats snack-type, junk foods at home, such as ice cream, nabs, cookies, desserts. She receives meals on wheels, but does not eat them because she doesn't like the food.   Currently on a soft diet. She only ate 2 tablespoons of grits mixed with eggs at breakfast this morning. She takes a few swallows of the Boost Plus supplement a few times per day with medications.   Calorie count ordered today. Discussed with RN, who plans to get the nurse tech to help her document patient's intake.   Diet Order:  .TPN (CLINIMIX-E) Adult DIET SOFT Room service appropriate? Yes; Fluid consistency: Thin TPN (CLINIMIX-E) Adult  Skin:  Reviewed, no  issues  Last BM:  10/9  Height:   Ht Readings from Last 1 Encounters:  12/18/15 5' (1.524 m)    Weight:   Wt Readings from Last 1 Encounters:  12/18/15 160 lb (72.6 kg)    Ideal Body Weight:  45.5 kg  BMI:  Body mass index is 31.25 kg/m.  Estimated Nutritional Needs:   Kcal:  1400-1600  Protein:  65-80 grams  Fluid:  1.4-1.5 L  EDUCATION NEEDS:   No education needs identified at this time   , RD, LDN, CNSC Pager [phone] After Hours Pager [phone]  

## 2015-12-28 NOTE — Progress Notes (Signed)
[facility] Progress Note  7 Days Post-Op  Subjective: C/o abdominal pain. Still having diarrhea. Tolerating diet. Denies nausea/vomiting. Productive cough. No hemoptysis.  Poor oral intake - again discussed supplementing with ensure and increasing oral intake.  Pulling 250 on IS.  Sat up to chair yesterday. Attempting to work with PT.   Objective: Vital signs in last 24 hours: Temp:  [99 F (37.2 C)-99.6 F (37.6 C)] 99.4 F (37.4 C) (10/10 0600) Pulse Rate:  [88-100] 100 (10/10 0600) Resp:  [18-19] 18 (10/10 0600) BP: (116-143)/(54-76) 116/54 (10/10 0600) SpO2:  [100 %] 100 % (10/10 0600) Last BM Date: 12/27/15  Intake/Output from previous day: 10/09 0701 - 10/10 0700 In: 1765 [P.O.:855; I.V.:860; IV Piggyback:50] Out: [id number] [Urine:1600; Drains:60; Stool:10] Intake/Output this shift: No intake/output data recorded.  PE: Gen:  Alert, cooperative, deconditioned  Abd: Soft, appropriately tender, ND, +BS, midline dressing in place c/d/i, no leakage or surrounding erythema.  RLQ drain clogged with clotted blood, stripped the drain and it started working again - put about about 5-10 cc sanguinous output almost immediately.  Lab Results:   Recent Labs  12/27/15 0313  WBC 19.7*  HGB 8.1*  HCT 25.2*  PLT 381   BMET  Recent Labs  12/27/15 0313 12/28/15 0544  NA 132* 130*  K 4.3 4.7  CL 102 99*  CO2 25 24  GLUCOSE 113* 123*  BUN 12 12  CREATININE 0.57 0.56  CALCIUM 8.2* 8.3*   PT/INR No results for input(s): LABPROT, INR in the last 72 hours. CMP     Component Value Date/Time   NA 130 (L) 12/28/2015 0544   K 4.7 12/28/2015 0544   CL 99 (L) 12/28/2015 0544   CO2 24 12/28/2015 0544   GLUCOSE 123 (H) 12/28/2015 0544   BUN 12 12/28/2015 0544   CREATININE 0.56 12/28/2015 0544   CALCIUM 8.3 (L) 12/28/2015 0544   PROT 4.6 (L) 12/27/2015 0313   ALBUMIN 1.5 (L) 12/27/2015 0313   AST 14 (L) 12/27/2015 0313   ALT 7 (L) 12/27/2015 0313   ALKPHOS 62  12/27/2015 0313   BILITOT 0.2 (L) 12/27/2015 0313   GFRNONAA >60 12/28/2015 0544   GFRAA >60 12/28/2015 0544   Lipase     Component Value Date/Time   LIPASE 12 (L) 08/06/2014 2020   Anti-infectives: Anti-infectives    Start     Dose/Rate Route Frequency Ordered Stop   12/25/15 1300  piperacillin-tazobactam (ZOSYN) IVPB 3.375 g     3.375 g 12.5 mL/hr over 240 Minutes Intravenous Every 8 hours 12/25/15 1218     12/20/15 1000  ciprofloxacin (CIPRO) IVPB 400 mg  Status:  Discontinued     400 mg 200 mL/hr over 60 Minutes Intravenous 2 times daily 12/20/15 0907 12/25/15 1151   12/20/15 0945  metroNIDAZOLE (FLAGYL) IVPB 500 mg  Status:  Discontinued     500 mg 100 mL/hr over 60 Minutes Intravenous Every 8 hours 12/20/15 0907 12/25/15 1151   12/18/15 0430  ciprofloxacin (CIPRO) IVPB 400 mg  Status:  Discontinued     400 mg 200 mL/hr over 60 Minutes Intravenous 2 times daily 12/18/15 0427 12/20/15 0904   12/18/15 0430  metroNIDAZOLE (FLAGYL) IVPB 500 mg  Status:  Discontinued     500 mg 100 mL/hr over 60 Minutes Intravenous Every 8 hours 12/18/15 0427 12/20/15 0904   12/18/15 0345  piperacillin-tazobactam (ZOSYN) IVPB 3.375 g  Status:  Discontinued     3.375 g 100 mL/hr over 30 Minutes Intravenous  Once 12/18/15 0330 12/18/15 0427     Assessment/Plan S/p procedures: 1. Exploratory laparotomy. 2. Lysis of adhesions. 3. Removal of previously placed mesh. 4. Complex abdominal wall closure and hernia repair with biologic mesh (20 cm x 25 cm Phasix).-- 12/21/15 Dr. Ninfa Linden - POD#7 - on full liquids and TNA  - having bowel function, diarrhea - RLQ drain 60cc/24hr - Appreciate WOC recommendations: Mepitel to protect mesh, hydrogel and gauze to promote moist healing - WBC up to 19.7 from 14 yesterday - repeat CBC pending.   ?Diverticulitis - was on cipro/flagyl for 8 days, now zosyn day 3/5 per internal medicine  Hyponatremia - 130, per medicine Hypokalemia - resolved,  4.7 Anemia - Hg 8.1 yesterday, continue to monitor Acute hypoxic respiratory failure- CXR with developing PNA and patient with productive cough, on zosyn. Continue IS. Discussed importance of working with PT with patient Chronic pain - started back on methadone 10/8 GERD - protonix Hiatal hernia  ID - was on cipro/flagyl for 8 days, now zosyn day 4/5 per internal medicine FEN - soft, wean TNA VTE - SCD, lovenox   Plan - Calorie count. Decrease TNA to 40 cc/hr. Possible d/c TNA if oral intake is sufficient. Continue Boost. Encourage work with PT and use of IS.  Follow-up CBC. continue drain and antibiotics     LOS: 10 days    Jill Alexanders , [facility] Surgery 12/28/2015, 9:25 AM Pager: [phone] Consults: [phone] Mon-Fri 7:00 am-4:30 pm Sat-Sun 7:00 am-11:30 am

## 2015-12-28 NOTE — Progress Notes (Signed)
Pt is on methadone which she has been receiving through a pain clinic- pt can not go to SNF with methadone- MD informed of this barrier to SNF placement  CSW went to inform pt of this barrier and patient stated that she would not be going anywhere but would instead return home when ready for DC- she has great confidence in her primary doctors  Per RN pt is not always completed oriented- CSW will attempt to discuss at later time with pt and pt husband to ensure comprehension and agreement with decision.  Jorge Ny, [employer] Social Worker [phone]

## 2015-12-28 NOTE — Progress Notes (Signed)
PARENTERAL NUTRITION CONSULT NOTE - FOLLOW UP  Pharmacy Consult: TPN Indication:  Prolonged ileus  No Known Allergies  Patient Measurements: Height: 5' (152.4 cm) Weight: 160 lb (72.6 kg) IBW/kg (Calculated) : 45.5  Vital Signs: Temp: 99.4 F (37.4 C) (10/10 0600) Temp Source: Oral (10/10 0600) BP: 116/54 (10/10 0600) Pulse Rate: 100 (10/10 0600) Intake/Output from previous day: 10/09 0701 - 10/10 0700 In: [id number] [P.O.:855; I.V.:860; IV Piggyback:50] Out: [id number] [Urine:1600; Drains:60; Stool:10] Intake/Output from this shift: No intake/output data recorded.  Labs:  Recent Labs  12/27/15 0313  WBC 19.7*  HGB 8.1*  HCT 25.2*  PLT 381     Recent Labs  12/26/15 1100 12/27/15 0313 12/28/15 0544  NA 131* 132* 130*  K 3.8 4.3 4.7  CL 102 102 99*  CO2 23 25 24   GLUCOSE 139* 113* 123*  BUN 9 12 12   CREATININE 0.54 0.57 0.56  CALCIUM 8.3* 8.2* 8.3*  MG 1.8 2.1 1.9  PHOS 2.6 3.3 3.8  PROT  --  4.6*  --   ALBUMIN  --  1.5*  --   AST  --  14*  --   ALT  --  7*  --   ALKPHOS  --  62  --   BILITOT  --  0.2*  --   PREALBUMIN  --  6.9*  --   TRIG  --  64  --    Estimated Creatinine Clearance: 47.4 mL/min (by C-G formula based on SCr of 0.56 mg/dL).    Recent Labs  12/27/15 1230 12/27/15 1747 12/28/15 0629  GLUCAP 115* 113* 127*     Insulin Requirements in the past 24 hours:  DC SSI and CBGs 10/9  Assessment: [name] s/p ex-lap with LOA, removal of previously placed mesh, complex abdominal wall closure and hernia repair with biologic mesh.  She has been NPO x5 day since admission and >7 days per patient report.  Pharmacy consulted to manage TPN for prolonged ileus.  Per documentation, patient lost 10 lbs over the past week PTA and was eating minimally since 12/15/15.   GI: hx GERD - on PO PPI. prealbumin <5 >> 6.9.  Having loose BMs, diet advanced yesterday to soft. D/w CCS PA - pt is eating poorly. 855 ml of po intake recorded yesterday. Boost supplements added  yesterday. Plan to get calorie count and continue TPN x 1 more day.  OK to decrease TPN rate to 40 ml/hr tonight with new bag per CCS.  Endo: no hx DM Lytes: Na 130, K 4.7, phos 3.8 mag 1.9  Renal: SCr 0.56 stable, CrCL 47 ml/min, BUN WNL Pulm: satting well on 5L [place] Cards: PAH / CHF -(PTA Lasix, Lovaza on hold) Hepatobil: LFTs / tbili / TG WNL Neuro: depression - PTA Cymbalta,  gabapentin on hold. Methadone resumed 10/8 ID: Zosyn D#4/5  for asp PNA, s/p 8d Cipro/Flagyl for diverticulitis - afebrile, WBC up to 19.7, AF Best Practices: Lovenox TPN Access: PICC placed 12/22/15 TPN start date: 12/23/15 Current Nutrition:  TPN Soft diet  Nutritional Goals: 1400-1600 kCal and 65-80 gm protein per day  Plan:  - decrease Clinimix E 5/15 to 40 ml/hr with lipids at 10  ml/hr.  TPN will provide ~1162 kCal and 48 gm of protein per day,  - MD to order calorie count - Taking PO MVI - PO PPI - F/U PO intake to stopTPN  Eudelia Bunch, Pharm.D. [id number] 12/28/2015 9:41 AM

## 2015-12-28 NOTE — Progress Notes (Signed)
Occupational Therapy Treatment Patient Details Name: Monique Russo MRN: [id number] DOB: Nov 28, 1930 Today's Date: 12/28/2015    History of present illness 82 y.o. female with a past medical history significant for chronic pain on methadone and chronic non-healing surgical wound who presented with abdominal pain and vomiting for 3 days. Now s/p ExLap, LOA, removal of prior mesh and complex abdominal wall closure and hernia repair with biologic mesh on 10/3.   OT comments  Pt very limited by pain this session, and did not make progress towards OT goals. Attempted to sit EOB x2, but able to perform bed level ADL with OT providing hygiene supplies. Pt motivated and more agreeable with Elvis music playing in background. Pt educated on importance of moving and sitting up as well as spirometer, which Pt demonstrated x6. Pt still requires OT to maximize independence in ADL prior to d/c to SNF. Next session to continue on working on sitting EOB to perform ADL. Pt shared that her ultimate goal would be to be able to go shopping in her wheelchair again.    Follow Up Recommendations  SNF    Equipment Recommendations  None recommended by OT    Recommendations for Other Services      Precautions / Restrictions Precautions Precautions: Fall Precaution Comments: abdominal incision and JP drain R LQ Restrictions Weight Bearing Restrictions: No       Mobility Bed Mobility Overal bed mobility: Needs Assistance;+2 for physical assistance Bed Mobility: Rolling;Sidelying to Sit (attempt x2, Pt refused due to pain) Rolling: Mod assist Sidelying to sit: Max assist;HOB elevated       General bed mobility comments: attempt to sit EOB x2, Pt made it 3/4 of the way and then refused, laying back down. Pt did attempt to assist with repositioning in bed  Transfers                      Balance                                   ADL Overall ADL's : Needs assistance/impaired      Grooming: Set up;Bed level;Wash/dry face;Oral care Grooming Details (indicate cue type and reason): with HOB elevated, Pt brushed teeth with cup method and washed face                               General ADL Comments: Pt educated on spirometer to prevent pnumonia, and importance of OOB. Attempted to sit EOB x2 but Pt crying out and complaining of too much pain. Pt about 3/4 of the way to EOB then refused further.      Vision                     Perception     Praxis      Cognition   Behavior During Therapy: Anxious Overall Cognitive Status: Within Functional Limits for tasks assessed                       Extremity/Trunk Assessment               Exercises     Shoulder Instructions       General Comments      Pertinent Vitals/ Pain       Pain Assessment: 0-10 Pain Score: 8  Faces Pain  Scale: Hurts whole lot Pain Location: Abdomen and back Pain Descriptors / Indicators: Crying;Grimacing;Sharp;Sore;Moaning Pain Intervention(s): Limited activity within patient's tolerance;Monitored during session;Utilized relaxation techniques;Patient requesting pain meds-RN notified  Home Living                                          Prior Functioning/Environment              Frequency  Min 2X/week        Progress Toward Goals  OT Goals(current goals can now be found in the care plan section)  Progress towards OT goals: Not progressing toward goals - comment (Pt very limited today during session)  Acute Rehab OT Goals Patient Stated Goal: to get well enough  to go shopping again OT Goal Formulation: With patient/family Time For Goal Achievement: 01/06/16 Potential to Achieve Goals: Good  Plan Discharge plan remains appropriate    Co-evaluation                 End of Session Equipment Utilized During Treatment: Oxygen   Activity Tolerance Patient limited by pain   Patient Left in bed;with call  bell/phone within reach;with family/visitor present   Nurse Communication Patient requests pain meds        Time: [id number] OT Time Calculation (min): 36 min  Charges: OT General Charges $OT Visit: 1 Procedure OT Treatments $Self Care/Home Management : 23-37 mins  Merri Ray Arran Fessel 12/28/2015, 3:59 PM Hulda Humphrey OTR/L [phone]

## 2015-12-29 DIAGNOSIS — G8929 Other chronic pain: Secondary | ICD-10-CM

## 2015-12-29 DIAGNOSIS — M545 Low back pain: Secondary | ICD-10-CM

## 2015-12-29 LAB — COMPREHENSIVE METABOLIC PANEL
ALBUMIN: 1.3 g/dL — AB (ref 3.5–5.0)
ALK PHOS: 55 U/L (ref 38–126)
ALT: 8 U/L — ABNORMAL LOW (ref 14–54)
AST: 14 U/L — AB (ref 15–41)
Anion gap: 6 (ref 5–15)
BILIRUBIN TOTAL: 0.2 mg/dL — AB (ref 0.3–1.2)
BUN: 11 mg/dL (ref 6–20)
CO2: 26 mmol/L (ref 22–32)
CREATININE: 0.6 mg/dL (ref 0.44–1.00)
Calcium: 8.4 mg/dL — ABNORMAL LOW (ref 8.9–10.3)
Chloride: 99 mmol/L — ABNORMAL LOW (ref 101–111)
GFR calc Af Amer: >60 mL/min (ref >60)
GLUCOSE: 108 mg/dL — AB (ref 65–99)
POTASSIUM: 4.8 mmol/L (ref 3.5–5.1)
Sodium: 131 mmol/L — ABNORMAL LOW (ref 135–145)
Total Protein: 4.9 g/dL — ABNORMAL LOW (ref 6.5–8.1)

## 2015-12-29 LAB — CBC
HCT: 23.1 % — ABNORMAL LOW (ref 36.0–46.0)
Hemoglobin: 7.6 g/dL — ABNORMAL LOW (ref 12.0–15.0)
MCH: 29.9 pg (ref 26.0–34.0)
MCHC: 32.9 g/dL (ref 30.0–36.0)
MCV: 90.9 fL (ref 78.0–100.0)
PLATELETS: 381 10*3/uL (ref 150–400)
RBC: 2.54 MIL/uL — AB (ref 3.87–5.11)
RDW: 14.6 % (ref 11.5–15.5)
WBC: 16.2 10*3/uL — AB (ref 4.0–10.5)

## 2015-12-29 MED ORDER — LORAZEPAM 0.5 MG PO TABS
0.5000 mg | ORAL_TABLET | Freq: Three times a day (TID) | ORAL | Status: DC | PRN
  Administered 2015-12-29: 0.5 mg via ORAL
  Filled 2015-12-29: qty 1

## 2015-12-29 MED ORDER — CLINIMIX E/DEXTROSE (5/15) 5 % IV SOLN
INTRAVENOUS | Status: DC
  Administered 2015-12-29: 18:00:00 via INTRAVENOUS
  Filled 2015-12-29: qty 960

## 2015-12-29 MED ORDER — TEMAZEPAM 15 MG PO CAPS: 15.0000 mg | ORAL_CAPSULE | Freq: Every day | ORAL | Status: DC

## 2015-12-29 MED ORDER — DULOXETINE HCL 60 MG PO CPEP
60.0000 mg | ORAL_CAPSULE | Freq: Every day | ORAL | Status: DC
  Administered 2015-12-29 – 2016-01-05 (×8): 60 mg via ORAL
  Filled 2015-12-29 (×8): qty 1

## 2015-12-29 MED ORDER — FAT EMULSION 20 % IV EMUL
240.0000 mL | INTRAVENOUS | Status: DC
  Administered 2015-12-29: 240 mL via INTRAVENOUS
  Filled 2015-12-29: qty 250

## 2015-12-29 MED ORDER — PIPERACILLIN-TAZOBACTAM 3.375 G IVPB
3.3750 g | Freq: Three times a day (TID) | INTRAVENOUS | Status: DC
  Administered 2015-12-29 – 2015-12-30 (×3): 3.375 g via INTRAVENOUS
  Filled 2015-12-29 (×5): qty 50

## 2015-12-29 MED ORDER — TEMAZEPAM 15 MG PO CAPS
15.0000 mg | ORAL_CAPSULE | ORAL | Status: AC | PRN
  Administered 2015-12-29 – 2015-12-30 (×2): 15 mg via ORAL
  Filled 2015-12-29 (×2): qty 1

## 2015-12-29 MED ORDER — OXYCODONE HCL 5 MG PO TABS
5.0000 mg | ORAL_TABLET | ORAL | Status: DC | PRN
  Administered 2015-12-29 – 2016-01-01 (×5): 5 mg via ORAL
  Filled 2015-12-29 (×5): qty 1

## 2015-12-29 MED ORDER — METHADONE HCL 10 MG PO TABS
5.0000 mg | ORAL_TABLET | Freq: Two times a day (BID) | ORAL | Status: DC
  Administered 2015-12-29 – 2015-12-30 (×2): 5 mg via ORAL
  Filled 2015-12-29 (×2): qty 1

## 2015-12-29 MED ORDER — METHOCARBAMOL 750 MG PO TABS
750.0000 mg | ORAL_TABLET | Freq: Three times a day (TID) | ORAL | Status: DC | PRN
  Administered 2015-12-29 – 2016-01-01 (×4): 750 mg via ORAL
  Filled 2015-12-29 (×4): qty 1

## 2015-12-29 MED ORDER — LOPERAMIDE HCL 2 MG PO CAPS
2.0000 mg | ORAL_CAPSULE | Freq: Every day | ORAL | Status: DC
  Administered 2015-12-29 – 2015-12-30 (×2): 2 mg via ORAL
  Filled 2015-12-29 (×2): qty 1

## 2015-12-29 NOTE — Progress Notes (Signed)
PT Cancellation Note  Patient Details Name: Monique Russo MRN: [id number] DOB: [birth year]   Cancelled Treatment:    Reason Eval/Treat Not Completed: Fatigue/lethargy limiting ability to participate.  Pt adamantly refusing any PT or mobility at this time repeatedly telling PT "leave me alone".  Strong education on need for mobility, however pt continues to refuse.  Will follow-up another time.     Cassy Sprowl, Thornton Papas 12/29/2015, 12:11 PM

## 2015-12-29 NOTE — Progress Notes (Signed)
PROGRESS NOTE  Monique Russo [id number] DOB: 1930-06-18 DOA: 12/17/2015 PCP: Monique Pel, MD   LOS: 11 days   Brief Narrative: Monique Russo a 82 y.o.femalewith a past medical history significant for chronic pain on methadone and chronic non-healing surgical woundwho presented with abdominal pain and vomiting for 3 days. The patient had a perforated ulcer 1 year ago requiring Monique Russo patch.This surgery was unfortunately complicated by nonhealing surgical wound (because of 82 year old infected hernia repair mesh?), Which for the last year the patient has been having wound care by her general surgeon in the office every six weeks (per family). This week, two days prior to admission, the patient had an office debridement and described that Dr. Excell Russo did his usual debridement, after which she began to develop nausea and LLQ abdominal pain. This crampy, severe, LLQ aching abdominal pain continued intermittently and was associated with several episodes of nonbloody emesis and obstipation.   On the morning of admission, she was recommended by surgery office for ER evaluation. CT of the abdomen and pelvis was obtained that showed small bowel obstruction with transition point in the right mid abdomenand stranding around the colon, consistent with diverticulitis. Surgery was consulted and patient was admitted for further workup. Patient's condition did not improve and she underwent ExLap, LOA, removal of prior mesh and complex abdominal wall closure and hernia repair with biologic mesh on 10/3. Postoperative course was initially complicated by mild ileus, and patient needed to be started on TPN, however she is improving now, and her diet is advanced. On 10/9, patient started developing significant watery diarrhea requiring flexi-seal. C diff is checked and returned negative.  Assessment & Plan: Principal Problem:   Small bowel obstruction Active Problems:   Hyponatremia  Chronic pain syndrome   Diverticulitis of large intestine without perforation or abscess without bleeding   SBO (small bowel obstruction)   Small bowel obstruction - Per patient, had a BM 10/2, however abdominal x-ray 10/3 showed persistent high-grade small bowel obstruction - general surgery, following, patient underwent ExLap, LOA, removal of prior mesh and complex abdominal wall closure and hernia repair with biologic mesh on 10/3. - continues to have BMs, tolerating a diet. Drank some boost o/w not eating much According to general surgery patient is not stable for discharge, currently also on TPN  RLQ drain 60cc/24hr - Appreciate WOC recommendations: Mepitel to protect mesh, hydrogel and gauze to promote moist healing  Diarrhea - Patient developed significant amounts of watery diarrhea on 10/9, requiring a Flexi Seal placement, C. difficile was checked and it returned negative - Advance diet, closely monitor electrolytes  Diverticulitis - There is stranding around the colon that suggests diverticulitis. - was on Ciprofloxacin and Flagyl IV for 8 days. Now on Zosyn day 5/5, will continue Zosyn for now due to slow improvement in her white count   Aspiration PNA vs HCAP,  - SLP evaluation ordered, discussed at length with daughter, she understands aspiration risk - CXR with developing PNA, last chest x-ray was done on 10/7  - started Zosyn which will have good intra-abdominal coverage as well as HCAP - patient complains of productive cough on 10/7, improving now  Hyponatremia - Improving - on TPN  Chronic pain syndrome Currently on methadone,  will start tapering inpatient, see if  she tolerates weaning or develops withdrawal symptoms,  Medications that can help with opioid withdrawal symptoms during a taper include clonidine, hydroxyzine, Tylenol or ibuprofen, hyocosamine, ondansetron or Phenergan, and fluids.  - pain improved now.  -  Discussed with social worker regarding  methadone and fact that almost all skilled nursing facilities will not take a patient on methadone and she will clearly require SNF level care.  She does not seem to be a candidate for LTAC per case manager Check 12-lead EKG to measured QTc, if greater than 450, a very methadone at 20-30% every 7-14 days would be justified  GERD and hiatal hernia - Replace PPI with IV famotidine while NPO  - DG barium esophagus showed marked lower esophageal tortuosity with large hiatal hernia but no mass. For definitive imaging, patient will need EGD but this can be done at a later date. This may contribute to her aspiration / reflux, discussed with daughter at bedside 10/6 That she will need close outpatient gastroenterology follow-up once her acute illness is resolved  Acute hypoxic respiratory failure - post op requiring 4L [place] - CXR without overt pulmonary edema 10/4 however developing infiltrate 10/7. Abx as above - closely monitor respiratory status    DVT prophylaxis: SCD Code Status: Full Family Communication: d/w daughter at bedside Disposition Plan: remain inpatient  Consultants:   General surgery  Procedures:   ExLap, LOA, removal of prior mesh and complex abdominal wall closure and hernia repair with biologic mesh on 10/3.  Foley: yes  Antimicrobials:  Ciprofloxacin 9/30 >> 10/7  Flagyl 9/30 >> 10/7  Zosyn 10/7 >>  Subjective: - She appears much more alert for me today, complains of pain in her abdomen as well as lower extremities.  Objective: Vitals:   12/28/15 0600 12/28/15 1508 12/28/15 2209 12/29/15 0641  BP: (!) 116/54 93/67 (!) 108/49 112/63  Pulse: 100 98 81 99  Resp: 18 18 18 18   Temp: 99.4 F (37.4 C) 98.8 F (37.1 C) 99 F (37.2 C) 98.7 F (37.1 C)  TempSrc: Oral Oral Oral Oral  SpO2: 100% 100% 100% 100%  Weight:      Height:        Intake/Output Summary (Last 24 hours) at 12/29/15 1013 Last data filed at 12/29/15 0643  Gross per 24 hour  Intake              1300 ml  Output            604.5 ml  Net            695.5 ml   Filed Weights   12/17/15 2249 12/18/15 1238  Weight: 73 kg (161 lb) 72.6 kg (160 lb)    Examination: Constitutional: in pain,appears distressed Vitals:   12/28/15 0600 12/28/15 1508 12/28/15 2209 12/29/15 0641  BP: (!) 116/54 93/67 (!) 108/49 112/63  Pulse: 100 98 81 99  Resp: 18 18 18 18   Temp: 99.4 F (37.4 C) 98.8 F (37.1 C) 99 F (37.2 C) 98.7 F (37.1 C)  TempSrc: Oral Oral Oral Oral  SpO2: 100% 100% 100% 100%  Weight:      Height:       Eyes: PERRL Neck: normal, supple Respiratory: no wheezing, gurgling sounds Cardiovascular: Regular rate and rhythm, no murmurs / rubs / gallops. No LE edema. Abdomen: dressing in place, C/D/I Musculoskeletal: no clubbing / cyanosis. Neurologic: non focal    Data Reviewed: I have personally reviewed following labs and imaging studies  CBC:  Recent Labs Lab 12/24/15 0500 12/25/15 0500 12/27/15 0313 12/28/15 1030 12/29/15 0430  WBC 15.8* 14.8* 19.7* 19.4* 16.2*  NEUTROABS 11.7*  --  15.1*  --   --   HGB 8.6* 8.6* 8.1* 7.9* 7.6*  HCT 26.5* 27.0* 25.2* 23.8* 23.1*  MCV 95.0 95.1 92.6 90.8 90.9  PLT 393 394 381 385 [id number]   Basic Metabolic Panel:  Recent Labs Lab 12/24/15 0500 12/25/15 0500 12/26/15 1100 12/27/15 0313 12/28/15 0544 12/29/15 0430  NA 139 138 131* 132* 130* 131*  K 3.6 3.8 3.8 4.3 4.7 4.8  CL 105 108 102 102 99* 99*  CO2 26 25 23 25 24 26   GLUCOSE 147* 125* 139* 113* 123* 108*  BUN 6 8 9 12 12 11   CREATININE 0.60 0.57 0.54 0.57 0.56 0.60  CALCIUM 8.7* 8.6* 8.3* 8.2* 8.3* 8.4*  MG 1.7 2.0 1.8 2.1 1.9  --   PHOS 1.0* 2.9 2.6 3.3 3.8  --    GFR: Estimated Creatinine Clearance: 47.4 mL/min (by C-G formula based on SCr of 0.6 mg/dL). Liver Function Tests:  Recent Labs Lab 12/23/15 0500 12/24/15 0500 12/25/15 0500 12/27/15 0313 12/29/15 0430  AST 14* 14* 11* 14* 14*  ALT 9* 9* 7* 7* 8*  ALKPHOS 57 44 41 62 55  BILITOT 0.8  0.4 0.3 0.2* 0.2*  PROT 4.8* 4.8* 4.9* 4.6* 4.9*  ALBUMIN 1.7* 1.6* 1.6* 1.5* 1.3*   CBG:  Recent Labs Lab 12/27/15 0621 12/27/15 1230 12/27/15 1747 12/28/15 0629 12/28/15 1151  GLUCAP 135* 115* 113* 127* 143*   Lipid Profile:  Recent Labs  12/27/15 0313  TRIG 64   Thyroid Function Tests: No results for input(s): TSH, T4TOTAL, FREET4, T3FREE, THYROIDAB in the last 72 hours. Anemia Panel: No results for input(s): VITAMINB12, FOLATE, FERRITIN, TIBC, IRON, RETICCTPCT in the last 72 hours. Urine analysis:    Component Value Date/Time   COLORURINE AMBER (A) 12/18/2015 1507   APPEARANCEUR CLEAR 12/18/2015 1507   LABSPEC >1.046 (H) 12/18/2015 1507   PHURINE 5.5 12/18/2015 1507   GLUCOSEU NEGATIVE 12/18/2015 1507   HGBUR NEGATIVE 12/18/2015 1507   BILIRUBINUR SMALL (A) 12/18/2015 1507   KETONESUR NEGATIVE 12/18/2015 1507   PROTEINUR NEGATIVE 12/18/2015 1507   UROBILINOGEN 0.2 08/04/2014 2100   NITRITE NEGATIVE 12/18/2015 1507   LEUKOCYTESUR NEGATIVE 12/18/2015 1507   Sepsis Labs: Invalid input(s): PROCALCITONIN, LACTICIDVEN  Recent Results (from the past 240 hour(s))  C difficile quick scan w PCR reflex     Status: None   Collection Time: 12/27/15  1:52 PM  Result Value Ref Range Status   C Diff antigen NEGATIVE NEGATIVE Final   C Diff toxin NEGATIVE NEGATIVE Final   C Diff interpretation Negative for C. difficile  Final      Radiology Studies: No results found.   Scheduled Meds: . enoxaparin (LOVENOX) injection  40 mg Subcutaneous Q24H  . lactose free nutrition  237 mL Oral TID WC  . methadone  5 mg Oral Q8H  . multivitamin with minerals  1 tablet Oral Daily  . pantoprazole  40 mg Oral Daily  . piperacillin-tazobactam (ZOSYN)  IV  3.375 g Intravenous Q8H  . timolol  1 drop Both Eyes QHS  . timolol  2 drop Both Eyes Daily   Continuous Infusions: . Marland KitchenTPN (CLINIMIX-E) Adult 40 mL/hr at 12/28/15 1714   And  . fat emulsion 240 mL (12/28/15 1714)    Marzetta Board, MD, PhD [facility] Pager [phone]  If 7PM-7AM, please contact night-coverage [email or web address] Password [email or web address] 12/29/2015, 10:13 AM

## 2015-12-29 NOTE — Care Management (Signed)
Received referral 12-28-15 for LTAC , discussed with Corrina at [facility] . Patient has not meet her insurance's criteria for any LTAC.   Magdalen Spatz RN BSN [phone]

## 2015-12-29 NOTE — Progress Notes (Signed)
Calorie Count Note  48 hour calorie count ordered.  Diet: Soft  Supplements: Boost Plus TID  Reviewed calorie count data. Some data incomplete; used meal ticket data collection, meal completion percentages, and meal delivery system nutrient analysis to assist with calorie count. Noted pt refused one Boost Plus supplement yesterday.   Noted pt is meeting nutritional needs via PO intake, however, this is based on assumption that pt is consuming 100% of Boost Plus supplements.   Per pharmacy note, plan to continue Clinimix E 5/15 at 40 ml/hr with lipids at 10  ml/hr.  TPN will provide ~1162 kCal and 48 gm of protein per day,  Per [facility] hope to stop TPN tomorrow.   Day 1 Breakfast: 155 kcals, 5 grams protein Lunch: 250 kcals, 11 grams protein Dinner: 249 kcals, 10 grams protein Supplements: 2 Boost Plus supplements (one refusal), 700 kcals, 32 grams protein  Total intake (PO's only): 1354 kcal (97% of minimum estimated needs)  58 grams protein (89% of minimum estimated needs)  Total intake (PO's and TPN): 2516 kcal (>100% of minimum estimated needs)  106 grams protein (>100% of minimum estimated needs)  Day 2 Breakfast: 40 kcals, 1 gram protein Lunch: n/a Dinner: n/a Supplements: 1 Boost Plus supplement (350 kcals, 16 grams protein)  Nutrition Dx: Inadequate oral intake related to poor appetite as evidenced by per patient/family report; ongoing  Goal: Patient will meet greater than or equal to 90% of their needs; ongoing  Intervention:    Continue Calorie count   Continue Snacks TID  Continue Boost Plus PO TID, each supplement provides 360 kcal and 14 gm protein  Monique Russo A. Jimmye Norman, RD, LDN, CDE Pager: [phone] After hours Pager: [phone]

## 2015-12-29 NOTE — Progress Notes (Signed)
PARENTERAL NUTRITION CONSULT NOTE - FOLLOW UP  Pharmacy Consult: TPN Indication:  Prolonged ileus  No Known Allergies  Patient Measurements: Height: 5' (152.4 cm) Weight: 160 lb (72.6 kg) IBW/kg (Calculated) : 45.5  Vital Signs: Temp: 98.7 F (37.1 C) (10/11 0641) Temp Source: Oral (10/11 0641) BP: 112/63 (10/11 0641) Pulse Rate: 99 (10/11 0641) Intake/Output from previous day: 10/10 0701 - 10/11 0700 In: 1300 [P.O.:300; I.V.:750; IV Piggyback:250] Out: 604.5 [Urine:550; Drains:52.5; Stool:2] Intake/Output from this shift: Total I/O In: 300 [Other:300] Out: -   Labs:  Recent Labs  12/27/15 0313 12/28/15 1030 12/29/15 0430  WBC 19.7* 19.4* 16.2*  HGB 8.1* 7.9* 7.6*  HCT 25.2* 23.8* 23.1*  PLT 381 385 381     Recent Labs  12/27/15 0313 12/28/15 0544 12/29/15 0430  NA 132* 130* 131*  K 4.3 4.7 4.8  CL 102 99* 99*  CO2 25 24 26   GLUCOSE 113* 123* 108*  BUN 12 12 11   CREATININE 0.57 0.56 0.60  CALCIUM 8.2* 8.3* 8.4*  MG 2.1 1.9  --   PHOS 3.3 3.8  --   PROT 4.6*  --  4.9*  ALBUMIN 1.5*  --  1.3*  AST 14*  --  14*  ALT 7*  --  8*  ALKPHOS 62  --  55  BILITOT 0.2*  --  0.2*  PREALBUMIN 6.9*  --   --   TRIG 64  --   --    Estimated Creatinine Clearance: 47.4 mL/min (by C-G formula based on SCr of 0.6 mg/dL).    Recent Labs  12/27/15 1747 12/28/15 0629 12/28/15 1151  GLUCAP 113* 127* 143*     Insulin Requirements in the past 24 hours:  DC SSI and CBGs 10/9  Assessment: 65 YOF s/p ex-lap with LOA, removal of previously placed mesh, complex abdominal wall closure and hernia repair with biologic mesh.  She has been NPO x5 day since admission and >7 days per patient report.  Pharmacy consulted to manage TPN for prolonged ileus.  Per documentation, patient lost 10 lbs over the past week PTA and was eating minimally since 12/15/15.   GI: hx GERD - on PO PPI. prealbumin <5 >> 6.9.  Having loose BMs, rectal tube in place, D/w [facility] PA - pt is eating  poorly and MD wants TPN  x 1 more day.  PO intake is poor. Calorie count in progress Endo: no hx DM Lytes: Na 131, K 487, phos 3.8 mag 1.9  Renal: SCr 0.5 stable, CrCL 47 ml/min, BUN WNL Pulm: satting well on 5L [place] Cards: PAH / CHF -(PTA Lasix, Lovaza on hold) Hepatobil: LFTs / tbili / TG WNL Neuro: depression - PTA Cymbalta,  gabapentin on hold. Methadone resumed 10/8 ID: Zosyn Day #5  for asp PNA, s/p 8d Cipro/Flagyl for diverticulitis - afebrile, WBC 16.2, AF Best Practices: Lovenox TPN Access: PICC placed 12/22/15 TPN start date: 12/23/15 Current Nutrition:  TPN Soft diet  Nutritional Goals: 1400-1600 kCal and 65-80 gm protein per day  Plan:- continue Clinimix E 5/15 at 40 ml/hr with lipids at 10  ml/hr.  TPN will provide ~1162 kCal and 48 gm of protein per day,  Per [facility] hope to stop TPN tomorrow.  - calorie count in progress, Boost supplements TID - Taking PO MVI - PO PPI - F/U PO intake to stop TPN - resume home cymbalta 60 mg qhs  Eudelia Bunch, Pharm.D. [id number] 12/29/2015 1:12 PM

## 2015-12-29 NOTE — Progress Notes (Signed)
[facility] Progress Note  8 Days Post-Op  Subjective: Refused PT today. Complaining of pain keeping her up at 3:30 this morning. Still having diarrhea, now with rectal tube in place. Denies nausea, vomiting. Poor PO intake. Refusing IS today.   Drain 112 cc/24h, sanguinous  Objective: Vital signs in last 24 hours: Temp:  [98.7 F (37.1 C)-99 F (37.2 C)] 98.7 F (37.1 C) (10/11 0641) Pulse Rate:  [81-99] 99 (10/11 0641) Resp:  [18] 18 (10/11 0641) BP: (93-112)/(49-67) 112/63 (10/11 0641) SpO2:  [100 %] 100 % (10/11 0641) Last BM Date: 12/28/15  Intake/Output from previous day: 10/10 0701 - 10/11 0700 In: 1300 [P.O.:300; I.V.:750; IV Piggyback:250] Out: 604.5 [Urine:550; Drains:52.5; Stool:2] Intake/Output this shift: Total I/O In: 300 [Other:300] Out: -   PE: Gen:  Alert, NAD, deconditioned  Abd: Soft, appropriately tender, ND, +BS, midline dressing in place c/d/i, no leakage or surrounding erythema. Drain in place with minimal sanguinous output.  Ext: BL LE swelling, no erythema or warmth   Lab Results:   Recent Labs  12/28/15 1030 12/29/15 0430  WBC 19.4* 16.2*  HGB 7.9* 7.6*  HCT 23.8* 23.1*  PLT 385 381   BMET  Recent Labs  12/28/15 0544 12/29/15 0430  NA 130* 131*  K 4.7 4.8  CL 99* 99*  CO2 24 26  GLUCOSE 123* 108*  BUN 12 11  CREATININE 0.56 0.60  CALCIUM 8.3* 8.4*   PT/INR No results for input(s): LABPROT, INR in the last 72 hours. CMP     Component Value Date/Time   NA 131 (L) 12/29/2015 0430   K 4.8 12/29/2015 0430   CL 99 (L) 12/29/2015 0430   CO2 26 12/29/2015 0430   GLUCOSE 108 (H) 12/29/2015 0430   BUN 11 12/29/2015 0430   CREATININE 0.60 12/29/2015 0430   CALCIUM 8.4 (L) 12/29/2015 0430   PROT 4.9 (L) 12/29/2015 0430   ALBUMIN 1.3 (L) 12/29/2015 0430   AST 14 (L) 12/29/2015 0430   ALT 8 (L) 12/29/2015 0430   ALKPHOS 55 12/29/2015 0430   BILITOT 0.2 (L) 12/29/2015 0430   GFRNONAA >60 12/29/2015 0430   GFRAA  >60 12/29/2015 0430   Lipase     Component Value Date/Time   LIPASE 12 (L) 08/06/2014 2020   Anti-infectives: Anti-infectives    Start     Dose/Rate Route Frequency Ordered Stop   12/29/15 1300  piperacillin-tazobactam (ZOSYN) IVPB 3.375 g     3.375 g 12.5 mL/hr over 240 Minutes Intravenous Every 8 hours 12/29/15 1016     12/25/15 1300  piperacillin-tazobactam (ZOSYN) IVPB 3.375 g  Status:  Discontinued     3.375 g 12.5 mL/hr over 240 Minutes Intravenous Every 8 hours 12/25/15 1218 12/29/15 1016   12/20/15 1000  ciprofloxacin (CIPRO) IVPB 400 mg  Status:  Discontinued     400 mg 200 mL/hr over 60 Minutes Intravenous 2 times daily 12/20/15 0907 12/25/15 1151   12/20/15 0945  metroNIDAZOLE (FLAGYL) IVPB 500 mg  Status:  Discontinued     500 mg 100 mL/hr over 60 Minutes Intravenous Every 8 hours 12/20/15 0907 12/25/15 1151   12/18/15 0430  ciprofloxacin (CIPRO) IVPB 400 mg  Status:  Discontinued     400 mg 200 mL/hr over 60 Minutes Intravenous 2 times daily 12/18/15 0427 12/20/15 0904   12/18/15 0430  metroNIDAZOLE (FLAGYL) IVPB 500 mg  Status:  Discontinued     500 mg 100 mL/hr over 60 Minutes Intravenous Every 8 hours 12/18/15 0427 12/20/15  [id number]   12/18/15 0345  piperacillin-tazobactam (ZOSYN) IVPB 3.375 g  Status:  Discontinued     3.375 g 100 mL/hr over 30 Minutes Intravenous  Once 12/18/15 0330 12/18/15 0427     Assessment/Plan /p procedures: 1. Exploratory laparotomy. 2. Lysis of adhesions. 3. Removal of previously placed mesh. 4. Complex abdominal wall closure and hernia repair with biologic mesh (20 cm x 25 cm Phasix).-- 12/21/15 Dr. Ninfa Linden - POD#7 - on full liquids and TNA  - having bowel function, diarrhea (c.dif negative) - RLQ drain 60cc/24hr - Appreciate WOC recommendations: Mepitel to protect mesh, hydrogel and gauze to promote moist healing - WBC 16.2 from 19.4  ?Diverticulitis - abx per medicine   Hyponatremia - 130, per medicine Hypokalemia -  resolved, 4.7 Anemia - Hg 8.1yesterday, continue to monitor Acute hypoxic respiratory failure- CXR with developing PNA and patient with productive cough, on zosyn. Continue IS. Discussed importance of working with PT with patient Chronic pain - started back on methadone 10/8 GERD- protonix Hiatal hernia  ID - was on cipro/flagyl for 8 days, now zosyn day 5/5 FEN - soft, wean TNA VTE - SCD, lovenox   Plan - Still with poor oral intake thus far based on calorie count. Continue TPN. continue Boost TID along with small meals. Encourage work with PT and use of IS. Will start low dose imodium for diarrhea. Add Oxy IR for increased pain control - patient on robaxin and methadone, and previously oxy 15mg  at home prior to surgery.  PT/OT recommending SNF but she will not be accepted while on methadone   LOS: 11 days    Jill Alexanders , [facility] 12/29/2015, 12:36 PM Pager: [phone] Consults: [phone] Mon-Fri 7:00 am-4:30 pm Sat-Sun 7:00 am-11:30 am

## 2015-12-30 DIAGNOSIS — R262 Difficulty in walking, not elsewhere classified: Secondary | ICD-10-CM

## 2015-12-30 DIAGNOSIS — Z7189 Other specified counseling: Secondary | ICD-10-CM

## 2015-12-30 DIAGNOSIS — E43 Unspecified severe protein-calorie malnutrition: Secondary | ICD-10-CM

## 2015-12-30 DIAGNOSIS — Z515 Encounter for palliative care: Secondary | ICD-10-CM

## 2015-12-30 DIAGNOSIS — G4720 Circadian rhythm sleep disorder, unspecified type: Secondary | ICD-10-CM

## 2015-12-30 LAB — CBC
HEMATOCRIT: 21.9 % — AB (ref 36.0–46.0)
HEMOGLOBIN: 7.5 g/dL — AB (ref 12.0–15.0)
MCH: 31.5 pg (ref 26.0–34.0)
MCHC: 34.2 g/dL (ref 30.0–36.0)
MCV: 92 fL (ref 78.0–100.0)
Platelets: 430 10*3/uL — ABNORMAL HIGH (ref 150–400)
RBC: 2.38 MIL/uL — ABNORMAL LOW (ref 3.87–5.11)
RDW: 14.6 % (ref 11.5–15.5)
WBC: 14.4 10*3/uL — ABNORMAL HIGH (ref 4.0–10.5)

## 2015-12-30 LAB — COMPREHENSIVE METABOLIC PANEL
ALBUMIN: 1.5 g/dL — AB (ref 3.5–5.0)
ALK PHOS: 58 U/L (ref 38–126)
ALT: 8 U/L — AB (ref 14–54)
AST: 13 U/L — AB (ref 15–41)
Anion gap: 5 (ref 5–15)
BUN: 11 mg/dL (ref 6–20)
CALCIUM: 8.6 mg/dL — AB (ref 8.9–10.3)
CO2: 27 mmol/L (ref 22–32)
CREATININE: 0.67 mg/dL (ref 0.44–1.00)
Chloride: 98 mmol/L — ABNORMAL LOW (ref 101–111)
GFR calc Af Amer: >60 mL/min (ref >60)
GFR calc non Af Amer: >60 mL/min (ref >60)
GLUCOSE: 112 mg/dL — AB (ref 65–99)
Potassium: 4.3 mmol/L (ref 3.5–5.1)
SODIUM: 130 mmol/L — AB (ref 135–145)
Total Bilirubin: 0.2 mg/dL — ABNORMAL LOW (ref 0.3–1.2)
Total Protein: 4.9 g/dL — ABNORMAL LOW (ref 6.5–8.1)

## 2015-12-30 LAB — MAGNESIUM: Magnesium: 1.8 mg/dL (ref 1.7–2.4)

## 2015-12-30 LAB — PHOSPHORUS: Phosphorus: 3.8 mg/dL (ref 2.5–4.6)

## 2015-12-30 LAB — GLUCOSE, CAPILLARY
GLUCOSE-CAPILLARY: 133 mg/dL — AB (ref 65–99)
Glucose-Capillary: 103 mg/dL — ABNORMAL HIGH (ref 65–99)

## 2015-12-30 MED ORDER — PIPERACILLIN-TAZOBACTAM 3.375 G IVPB
3.3750 g | Freq: Three times a day (TID) | INTRAVENOUS | Status: DC
  Administered 2015-12-30 – 2016-01-03 (×12): 3.375 g via INTRAVENOUS
  Filled 2015-12-30 (×17): qty 50

## 2015-12-30 MED ORDER — METHADONE HCL 10 MG PO TABS
5.0000 mg | ORAL_TABLET | Freq: Three times a day (TID) | ORAL | Status: DC
  Administered 2015-12-30 – 2016-01-06 (×18): 5 mg via ORAL
  Filled 2015-12-30 (×18): qty 1

## 2015-12-30 MED ORDER — LORAZEPAM 2 MG/ML PO CONC: 1.0000 mg | ORAL | Status: DC | PRN

## 2015-12-30 MED ORDER — ACETAMINOPHEN 650 MG RE SUPP: 650.0000 mg | Freq: Four times a day (QID) | RECTAL | Status: DC | PRN

## 2015-12-30 MED ORDER — LORAZEPAM 1 MG PO TABS: 1.0000 mg | ORAL_TABLET | ORAL | 0 refills | Status: DC | PRN

## 2015-12-30 MED ORDER — SODIUM CHLORIDE 0.9 % IV SOLN
250.0000 mL | INTRAVENOUS | Status: DC | PRN
  Administered 2016-01-05: 250 mL via INTRAVENOUS

## 2015-12-30 MED ORDER — SODIUM CHLORIDE 0.9% FLUSH: 3.0000 mL | INTRAVENOUS | Status: DC | PRN

## 2015-12-30 MED ORDER — GLYCOPYRROLATE 0.2 MG/ML IJ SOLN: 0.2000 mg | INTRAMUSCULAR | Status: DC | PRN

## 2015-12-30 MED ORDER — TRAZODONE HCL 50 MG PO TABS: 50.0000 mg | ORAL_TABLET | Freq: Every evening | ORAL | Status: DC | PRN

## 2015-12-30 MED ORDER — TEMAZEPAM 15 MG PO CAPS: 15.0000 mg | ORAL_CAPSULE | ORAL | Status: DC | PRN

## 2015-12-30 MED ORDER — MIRTAZAPINE 15 MG PO TBDP: 15.0000 mg | ORAL_TABLET | Freq: Every day | ORAL | Status: DC

## 2015-12-30 MED ORDER — SIMETHICONE 80 MG PO CHEW
160.0000 mg | CHEWABLE_TABLET | Freq: Once | ORAL | Status: AC
  Administered 2015-12-30: 160 mg via ORAL
  Filled 2015-12-30: qty 2

## 2015-12-30 MED ORDER — MORPHINE SULFATE (CONCENTRATE) 10 MG/0.5ML PO SOLN: 5.0000 mg | ORAL | 0 refills | Status: DC | PRN

## 2015-12-30 MED ORDER — OLANZAPINE 5 MG PO TBDP
5.0000 mg | ORAL_TABLET | Freq: Every day | ORAL | Status: DC
  Administered 2015-12-30 – 2015-12-31 (×2): 5 mg via ORAL
  Filled 2015-12-30 (×2): qty 1

## 2015-12-30 MED ORDER — FAT EMULSION 20 % IV EMUL: 240.0000 mL | INTRAVENOUS | Status: DC

## 2015-12-30 MED ORDER — TRAZODONE HCL 50 MG PO TABS
25.0000 mg | ORAL_TABLET | Freq: Every evening | ORAL | Status: DC | PRN
  Filled 2015-12-30: qty 1

## 2015-12-30 MED ORDER — GLYCOPYRROLATE 1 MG PO TABS
1.0000 mg | ORAL_TABLET | ORAL | Status: DC | PRN
  Filled 2015-12-30: qty 1

## 2015-12-30 MED ORDER — SODIUM CHLORIDE 0.9% FLUSH
3.0000 mL | Freq: Two times a day (BID) | INTRAVENOUS | Status: DC
  Administered 2015-12-30 – 2016-01-05 (×8): 3 mL via INTRAVENOUS

## 2015-12-30 MED ORDER — ACETAMINOPHEN 325 MG PO TABS
650.0000 mg | ORAL_TABLET | Freq: Four times a day (QID) | ORAL | Status: DC | PRN
  Administered 2016-01-01 – 2016-01-04 (×5): 650 mg via ORAL
  Filled 2015-12-30 (×5): qty 2

## 2015-12-30 MED ORDER — CLINIMIX E/DEXTROSE (5/15) 5 % IV SOLN: INTRAVENOUS | Status: DC

## 2015-12-30 MED ORDER — HYDROMORPHONE HCL 1 MG/ML IJ SOLN
0.5000 mg | INTRAMUSCULAR | Status: DC | PRN
  Administered 2015-12-30 – 2016-01-03 (×8): 0.5 mg via INTRAVENOUS
  Filled 2015-12-30 (×8): qty 1

## 2015-12-30 MED ORDER — LORAZEPAM 2 MG/ML IJ SOLN
1.0000 mg | INTRAMUSCULAR | Status: DC | PRN
  Administered 2015-12-30: 1 mg via INTRAVENOUS
  Filled 2015-12-30: qty 1

## 2015-12-30 MED ORDER — LORAZEPAM 1 MG PO TABS: 1.0000 mg | ORAL_TABLET | ORAL | Status: DC | PRN

## 2015-12-30 MED ORDER — ENOXAPARIN SODIUM 40 MG/0.4ML ~~LOC~~ SOLN
40.0000 mg | SUBCUTANEOUS | Status: DC
  Administered 2015-12-30 – 2016-01-06 (×8): 40 mg via SUBCUTANEOUS
  Filled 2015-12-30 (×8): qty 0.4

## 2015-12-30 NOTE — Progress Notes (Signed)
Pt was complaining of of abdominal pain all night, given all PRN meds, I paged on call MD to give additional meds, for pain when 2nd dilaudid IV was removed and I'm about to give, it discontinue the order and renew it, wasted the meds with witness 1 mg and give a new dilaudid and wasted 0.5 with witness, pt slept around 5am.

## 2015-12-30 NOTE — Care Management Important Message (Signed)
Important Message  Patient Details  Name: Monique Russo MRN: [id number] Date of Birth: January 11, 1931   Medicare Important Message Given:  Yes    [name] [place] 12/30/2015, 10:03 AM

## 2015-12-30 NOTE — Progress Notes (Signed)
Pt.'s daughter requests that we wait on putting the rectal tube back in.

## 2015-12-30 NOTE — Progress Notes (Signed)
Occupational Therapy Treatment and Discharge Patient Details Name: Monique Russo MRN: [id number] DOB: June 25, 1930 Today's Date: 12/30/2015    History of present illness 82 y.o. female with a past medical history significant for chronic pain on methadone and chronic non-healing surgical wound who presented with abdominal pain and vomiting for 3 days. Now s/p ExLap, LOA, removal of prior mesh and complex abdominal wall closure and hernia repair with biologic mesh on 10/3.   OT comments  OT and PT were already in room with pt when orders were discontinued.  Spoke with husband who indicated plan for comfort, but requested A with re-positioning pt and performing hygiene.  Pt needed total A for peri care and hand hygiene.  Feel pt will continue to need total care for all ADLs at D/C.  Per family wishes no further acute OT follow up at this time. OT need new order if needs arise, and wishes change.  Will sign off.  Follow Up Recommendations  No OT follow up    Equipment Recommendations  None recommended by OT    Recommendations for Other Services      Precautions / Restrictions Precautions Precautions: Fall Precaution Comments: abdominal incision and JP drain R LQ Restrictions Weight Bearing Restrictions: No       Mobility Bed Mobility Overal bed mobility: Needs Assistance;+2 for physical assistance Bed Mobility: Rolling Rolling: Mod assist;+2 for physical assistance         General bed mobility comments: pt rolled in bed to use bed pan and perform peri care.    Transfers                      Balance                                   ADL Overall ADL's : Needs assistance/impaired     Grooming: Maximal assistance;Bed level       Lower Body Bathing: Maximal assistance;+2 for physical assistance;Bed level               Toileting- Clothing Manipulation and Hygiene: Total assistance;+2 for physical assistance;+2 for safety/equipment;Bed  level Toileting - Clothing Manipulation Details (indicate cue type and reason): Pt was soiled when therapy entered the room, Pt total assist requiring 2 people to help with rolling, and peri care     Functional mobility during ADLs: Maximal assistance;+2 for physical assistance;+2 for safety/equipment (bed mobility) General ADL Comments: Pt total assist for bed level ADL, peri care and hand hygeine.      Vision                     Perception     Praxis      Cognition   Behavior During Therapy: Anxious Overall Cognitive Status: Within Functional Limits for tasks assessed                       Extremity/Trunk Assessment               Exercises     Shoulder Instructions       General Comments      Pertinent Vitals/ Pain       Pain Assessment: Faces Faces Pain Scale: Hurts even more Pain Location: Abdomen and back Pain Descriptors / Indicators: Guarding;Grimacing;Sore Pain Intervention(s): Monitored during session;Premedicated before session;Repositioned  Home Living  Prior Functioning/Environment              Frequency  Other (comment) (Discharge from OT)        Progress Toward Goals  OT Goals(current goals can now be found in the care plan section)  Progress towards OT goals: Not progressing toward goals - comment (Pt limited by pain and lethargy )     Plan Other (comment);Discharge plan needs to be updated (Per husband, Pt being changed to comfort care, and discontinuing OT)    Co-evaluation    PT/OT/SLP Co-Evaluation/Treatment: Yes Reason for Co-Treatment: For patient/therapist safety   OT goals addressed during session: ADL's and self-care      End of Session Equipment Utilized During Treatment: Oxygen   Activity Tolerance Patient limited by pain   Patient Left in bed;with call bell/phone within reach;with family/visitor present   Nurse Communication  Mobility status        Time: [id number] OT Time Calculation (min): 36 min  Charges: OT General Charges $OT Visit: 1 Procedure OT Treatments $Self Care/Home Management : 8-22 mins  Monique Russo Monique Russo 12/30/2015, 4:03 PM

## 2015-12-30 NOTE — Progress Notes (Signed)
PROGRESS NOTE  Monique Russo [id number] DOB: 21-Jan-1931 DOA: 12/17/2015 PCP: Horatio Pel, MD   LOS: 12 days   Brief Narrative: Monique Russo a 82 y.o.femalewith a past medical history significant for chronic pain on methadone and chronic non-healing surgical woundwho presented with abdominal pain and vomiting for 3 days. The patient had a perforated ulcer 1 year ago requiring [name] patch.This surgery was unfortunately complicated by nonhealing surgical wound (because of 82 year old infected hernia repair mesh?), Which for the last year the patient has been having wound care by her general surgeon in the office every six weeks (per family). This week, two days prior to admission, the patient had an office debridement and described that Dr. Excell Seltzer did his usual debridement, after which she began to develop nausea and LLQ abdominal pain. This crampy, severe, LLQ aching abdominal pain continued intermittently and was associated with several episodes of nonbloody emesis and obstipation.   On the morning of admission, she was recommended by surgery office for ER evaluation. CT of the abdomen and pelvis was obtained that showed small bowel obstruction with transition point in the right mid abdomenand stranding around the colon, consistent with diverticulitis. Surgery was consulted and patient was admitted for further workup. Patient's condition did not improve and she underwent ExLap, LOA, removal of prior mesh and complex abdominal wall closure and hernia repair with biologic mesh on 10/3. Postoperative course was initially complicated by mild ileus, and patient needed to be started on TPN, however she is improving now, and her diet is advanced. On 10/9, patient started developing significant watery diarrhea requiring flexi-seal. C diff is checked and returned negative.  Assessment & Plan: Principal Problem:   Small bowel obstruction Active Problems:   Hyponatremia  Chronic pain syndrome   Diverticulitis of large intestine without perforation or abscess without bleeding   SBO (small bowel obstruction)   Chronic bilateral low back pain without sciatica   Difficulty in walking, not elsewhere classified   Small bowel obstruction Last documented bowel movement 10/10, however abdominal x-ray 10/3 showed persistent high-grade small bowel obstruction, complicated surgical history, very poor nutritional status - general surgery, following, patient underwent ExLap, LOA, removal of prior mesh and complex abdominal wall closure and hernia repair with biologic mesh on 10/3. On TPN, boost, being managed by surgery According to general surgery patient is not stable for discharge, currently also on TPN  RLQ drain 60cc/24hr - Appreciate WOC recommendations: Mepitel to protect mesh, hydrogel and gauze to promote moist healing. Surgical team to make further recommendations regarding wound care, expected for wound healing given extremely poor nutritional status Continues to have severe abdominal pain, consider stopping Imodium? Transition to comfort care -Continue current pain regimen and adjust if needed- adjuvant Zyprexa and mirtazapine started  -Referral for hospice evaluation for residential hospice placement if possible    Diarrhea - Patient developed significant amounts of watery diarrhea on 10/9, requiring a Flexi Seal placement, C. difficile  negative Advanced at by general surgery recommendations   Diverticulitis - There is stranding around the colon that suggests diverticulitis. - was on Ciprofloxacin and Flagyl IV for 8 days. Now on Zosyn day 5/5, will continue Zosyn for now due to slow improvement in her white count   Aspiration PNA vs HCAP,  - SLP evaluation ordered, discussed at length with daughter, she understands aspiration risk - CXR with developing PNA, last chest x-ray was done on 10/7  - started Zosyn which will have good intra-abdominal  coverage as well as  HCAP - patient complains of productive cough on 10/7, improving now   Hyponatremia - Improving - on TPN  Chronic pain syndrome Continue home dose of methadone Medications that can help with opioid withdrawal symptoms during a taper include clonidine, hydroxyzine, Tylenol or ibuprofen, hyocosamine, ondansetron or Phenergan, and fluids.  - pain improved now.  - Discussed with social worker regarding methadone and fact that almost all skilled nursing facilities will not take a patient on methadone and she will clearly require SNF level care.  She does not seem to be a candidate for LTAC per case manager QTC 450, continue methadone at home dose without tapering   GERD and hiatal hernia - Replace PPI with IV famotidine while NPO  - DG barium esophagus showed marked lower esophageal tortuosity with large hiatal hernia but no mass. For definitive imaging, patient will need EGD but this can be done at a later date. This may contribute to her aspiration / reflux, discussed with daughter at bedside 10/6 That she will need close outpatient gastroenterology follow-up once her acute illness is resolved  Acute hypoxic respiratory failure - post op requiring 4L [place], unable to wean oxygen - CXR without overt pulmonary edema 10/4 however developing infiltrate 10/7. Abx as above - closely monitor respiratory status    DVT prophylaxis: SCD Code Status: Full Family Communication: d/w daughter at bedside Disposition Plan: remain inpatient, palliative care consultation recommended full comfort care    Consultants:   General surgery  Palliative care  Procedures:   ExLap, LOA, removal of prior mesh and complex abdominal wall closure and hernia repair with biologic mesh on 10/3.  Foley: yes  Antimicrobials:  Ciprofloxacin 9/30 >> 10/7  Flagyl 9/30 >> 10/7  Zosyn 10/7 >>  Subjective: Lethargic, discussed with husband and daughter , they would like to transition to  hospice   Objective: Vitals:   12/29/15 0641 12/29/15 1300 12/29/15 2208 12/30/15 0645  BP: 112/63 117/65 121/75 132/72  Pulse: 99 81 62 72  Resp: 18 18 17 17   Temp: 98.7 F (37.1 C) 98.1 F (36.7 C) 98.7 F (37.1 C) 98.8 F (37.1 C)  TempSrc: Oral Oral Oral Oral  SpO2: 100% 100% 98% 96%  Weight:      Height:        Intake/Output Summary (Last 24 hours) at 12/30/15 0929 Last data filed at 12/30/15 0704  Gross per 24 hour  Intake          1603.34 ml  Output             2500 ml  Net          -896.66 ml   Filed Weights   12/17/15 2249 12/18/15 1238  Weight: 73 kg (161 lb) 72.6 kg (160 lb)    Examination: Constitutional: in pain,appears distressed Vitals:   12/29/15 0641 12/29/15 1300 12/29/15 2208 12/30/15 0645  BP: 112/63 117/65 121/75 132/72  Pulse: 99 81 62 72  Resp: 18 18 17 17   Temp: 98.7 F (37.1 C) 98.1 F (36.7 C) 98.7 F (37.1 C) 98.8 F (37.1 C)  TempSrc: Oral Oral Oral Oral  SpO2: 100% 100% 98% 96%  Weight:      Height:       Eyes: PERRL Neck: normal, supple Respiratory: no wheezing, gurgling sounds Cardiovascular: Regular rate and rhythm, no murmurs / rubs / gallops. No LE edema. Abdomen: dressing in place, C/D/I Musculoskeletal: no clubbing / cyanosis. Neurologic: non focal    Data Reviewed: I have personally reviewed  following labs and imaging studies  CBC:  Recent Labs Lab 12/24/15 0500 12/25/15 0500 12/27/15 0313 12/28/15 1030 12/29/15 0430 12/30/15 0415  WBC 15.8* 14.8* 19.7* 19.4* 16.2* 14.4*  NEUTROABS 11.7*  --  15.1*  --   --   --   HGB 8.6* 8.6* 8.1* 7.9* 7.6* 7.5*  HCT 26.5* 27.0* 25.2* 23.8* 23.1* 21.9*  MCV 95.0 95.1 92.6 90.8 90.9 92.0  PLT 393 394 381 385 381 [id number]*   Basic Metabolic Panel:  Recent Labs Lab 12/25/15 0500 12/26/15 1100 12/27/15 0313 12/28/15 0544 12/29/15 0430 12/30/15 0415  NA 138 131* 132* 130* 131* 130*  K 3.8 3.8 4.3 4.7 4.8 4.3  CL 108 102 102 99* 99* 98*  CO2 25 23 25 24 26 27     GLUCOSE 125* 139* 113* 123* 108* 112*  BUN 8 9 12 12 11 11   CREATININE 0.57 0.54 0.57 0.56 0.60 0.67  CALCIUM 8.6* 8.3* 8.2* 8.3* 8.4* 8.6*  MG 2.0 1.8 2.1 1.9  --  1.8  PHOS 2.9 2.6 3.3 3.8  --  3.8   GFR: Estimated Creatinine Clearance: 47.4 mL/min (by C-G formula based on SCr of 0.67 mg/dL). Liver Function Tests:  Recent Labs Lab 12/24/15 0500 12/25/15 0500 12/27/15 0313 12/29/15 0430 12/30/15 0415  AST 14* 11* 14* 14* 13*  ALT 9* 7* 7* 8* 8*  ALKPHOS 44 41 62 55 58  BILITOT 0.4 0.3 0.2* 0.2* 0.2*  PROT 4.8* 4.9* 4.6* 4.9* 4.9*  ALBUMIN 1.6* 1.6* 1.5* 1.3* 1.5*   CBG:  Recent Labs Lab 12/27/15 1230 12/27/15 1747 12/28/15 0629 12/28/15 1151 12/30/15 0733  GLUCAP 115* 113* 127* 143* 103*   Lipid Profile: No results for input(s): CHOL, HDL, LDLCALC, TRIG, CHOLHDL, LDLDIRECT in the last 72 hours. Thyroid Function Tests: No results for input(s): TSH, T4TOTAL, FREET4, T3FREE, THYROIDAB in the last 72 hours. Anemia Panel: No results for input(s): VITAMINB12, FOLATE, FERRITIN, TIBC, IRON, RETICCTPCT in the last 72 hours. Urine analysis:    Component Value Date/Time   COLORURINE AMBER (A) 12/18/2015 1507   APPEARANCEUR CLEAR 12/18/2015 1507   LABSPEC >1.046 (H) 12/18/2015 1507   PHURINE 5.5 12/18/2015 1507   GLUCOSEU NEGATIVE 12/18/2015 1507   HGBUR NEGATIVE 12/18/2015 1507   BILIRUBINUR SMALL (A) 12/18/2015 1507   KETONESUR NEGATIVE 12/18/2015 1507   PROTEINUR NEGATIVE 12/18/2015 1507   UROBILINOGEN 0.2 08/04/2014 2100   NITRITE NEGATIVE 12/18/2015 1507   LEUKOCYTESUR NEGATIVE 12/18/2015 1507   Sepsis Labs: Invalid input(s): PROCALCITONIN, LACTICIDVEN  Recent Results (from the past 240 hour(s))  C difficile quick scan w PCR reflex     Status: None   Collection Time: 12/27/15  1:52 PM  Result Value Ref Range Status   C Diff antigen NEGATIVE NEGATIVE Final   C Diff toxin NEGATIVE NEGATIVE Final   C Diff interpretation Negative for C. difficile  Final       Radiology Studies: No results found.   Scheduled Meds: . DULoxetine  60 mg Oral QHS  . enoxaparin (LOVENOX) injection  40 mg Subcutaneous Q24H  . lactose free nutrition  237 mL Oral TID WC  . loperamide  2 mg Oral Daily  . methadone  5 mg Oral Q12H  . multivitamin with minerals  1 tablet Oral Daily  . pantoprazole  40 mg Oral Daily  . piperacillin-tazobactam (ZOSYN)  IV  3.375 g Intravenous Q8H  . timolol  1 drop Both Eyes QHS  . timolol  2 drop Both Eyes Daily  Continuous Infusions: . Marland KitchenTPN (CLINIMIX-E) Adult 40 mL/hr at 12/29/15 1820   And  . fat emulsion 240 mL (12/29/15 1820)    Marzetta Board, MD, PhD [facility] Pager [phone]  If 7PM-7AM, please contact night-coverage [email or web address] Password [email or web address] 12/30/2015, 9:29 AM

## 2015-12-30 NOTE — Progress Notes (Signed)
Physical Therapy Treatment Patient Details Name: Monique Russo MRN: [id number] DOB: [birth year] Today's Date: 12/30/2015    History of Present Illness 82 y.o. female with a past medical history significant for chronic pain on methadone and chronic non-healing surgical wound who presented with abdominal pain and vomiting for 3 days. Now s/p ExLap, LOA, removal of prior mesh and complex abdominal wall closure and hernia repair with biologic mesh on 10/3.    PT Comments    PT and OT were already in room with pt when orders were discontinued.  Spoke with husband who indicated plan for comfort, but requested A with re-positioning pt and performing hygiene.  Pt needed total A for peri care and hand hygiene.  Feel pt will continue to need total care for all ADLs at D/C.  Per family wishes no further acute PT f/u at this time and will need new order if needs arise and wishes change.  Will sign off.    Follow Up Recommendations  SNF     Equipment Recommendations  None recommended by PT    Recommendations for Other Services       Precautions / Restrictions Precautions Precautions: Fall Precaution Comments: abdominal incision and JP drain R LQ Restrictions Weight Bearing Restrictions: No    Mobility  Bed Mobility Overal bed mobility: Needs Assistance;+2 for physical assistance Bed Mobility: Rolling Rolling: Mod assist;+2 for physical assistance         General bed mobility comments: pt rolled in bed to use bed pan and perform peri care.    Transfers                    Ambulation/Gait                 Stairs            Wheelchair Mobility    Modified Rankin (Stroke Patients Only)       Balance                                    Cognition Arousal/Alertness: Awake/alert Behavior During Therapy: Anxious Overall Cognitive Status: Within Functional Limits for tasks assessed                      Exercises      General  Comments        Pertinent Vitals/Pain Pain Assessment: Faces Faces Pain Scale: Hurts even more Pain Location: Abdomen and back Pain Descriptors / Indicators: Grimacing;Guarding;Sore Pain Intervention(s): Monitored during session;Premedicated before session;Repositioned    Home Living                      Prior Function            PT Goals (current goals can now be found in the care plan section) Acute Rehab PT Goals Patient Stated Goal: to get well enough  to go shopping again Progress towards PT goals: Not progressing toward goals - comment (Pain)    Frequency           PT Plan Other (comment) (D/C PT)    Co-evaluation PT/OT/SLP Co-Evaluation/Treatment: Yes Reason for Co-Treatment: For patient/therapist safety PT goals addressed during session: Mobility/safety with mobility       End of Session Equipment Utilized During Treatment: Oxygen Activity Tolerance: Patient limited by fatigue;Patient limited by pain Patient left: in bed;with call bell/phone within reach;with  family/visitor present     Time: [id number] PT Time Calculation (min) (ACUTE ONLY): 36 min  Charges:  $Therapeutic Activity: 8-22 mins                    G CodesCatarina Hartshorn, [place] [id number] 12/30/2015, 11:52 AM

## 2015-12-30 NOTE — Progress Notes (Signed)
PARENTERAL NUTRITION CONSULT NOTE - FOLLOW UP  Pharmacy Consult: TPN Indication:  Prolonged ileus  No Known Allergies  Patient Measurements: Height: 5' (152.4 cm) Weight: 160 lb (72.6 kg) IBW/kg (Calculated) : 45.5  Vital Signs: Temp: 98.8 F (37.1 C) (10/12 0645) Temp Source: Oral (10/12 0645) BP: 132/72 (10/12 0645) Pulse Rate: 72 (10/12 0645) Intake/Output from previous day: 10/11 0701 - 10/12 0700 In: 1603.3 [P.O.:720; I.V.:483.3; IV Piggyback:100] Out: 1590 [Urine:1550; Drains:40] Intake/Output from this shift: Total I/O In: -  Out: 910 [Urine:850; Drains:60]  Labs:  Recent Labs  12/28/15 1030 12/29/15 0430 12/30/15 0415  WBC 19.4* 16.2* 14.4*  HGB 7.9* 7.6* 7.5*  HCT 23.8* 23.1* 21.9*  PLT 385 381 430*     Recent Labs  12/28/15 0544 12/29/15 0430 12/30/15 0415  NA 130* 131* 130*  K 4.7 4.8 4.3  CL 99* 99* 98*  CO2 24 26 27   GLUCOSE 123* 108* 112*  BUN 12 11 11   CREATININE 0.56 0.60 0.67  CALCIUM 8.3* 8.4* 8.6*  MG 1.9  --  1.8  PHOS 3.8  --  3.8  PROT  --  4.9* 4.9*  ALBUMIN  --  1.3* 1.5*  AST  --  14* 13*  ALT  --  8* 8*  ALKPHOS  --  55 58  BILITOT  --  0.2* 0.2*   Estimated Creatinine Clearance: 47.4 mL/min (by C-G formula based on SCr of 0.67 mg/dL).    Recent Labs  12/28/15 0629 12/28/15 1151 12/30/15 0733  GLUCAP 127* 143* 103*     Insulin Requirements in the past 24 hours:  SSI d/c'ed 12/27/15  Assessment: 46 YOF s/p ex-lap with LOA, removal of previously placed mesh, complex abdominal wall closure and hernia repair with biologic mesh.  She has been NPO x5 day since admission and >7 days per patient report.  Pharmacy consulted to manage TPN for prolonged ileus.  Per documentation, patient lost 10 lbs over the past week PTA and was eating minimally since 12/15/15.   GI: hx GERD.  Prealbumin <5 >> 6.9.  PO intake is poor and calorie count in progress.  Loperamide, multivitamin, PPI PO, drain O/P 6mL, no stool O/P Endo: no hx  DM - CBGs controlled Lytes: low Na/CL, CoCa 10.6 (Ca x Phos = 40, goal < 55), others WNL Renal: SCr 0.67 stable, CrCL 47 ml/min, BUN WNL - good UOP 0.9 ml/kg/hr Pulm: satting well on 5L [place] Cards: PAH / CHF - VSS (PTA Lasix, Lovaza on hold) Hepatobil: LFTs / tbili / TG WNL Neuro: depression - Cymbalta, methadone (home gabapentin on hold), PRN Dilaudid, oxycodone, Robaxin, Restoril ID: Zosyn D#6 for asp PNA, s/p 8d Cipro/Flagyl for diverticulitis - afebrile, WBC trending down Best Practices: Lovenox TPN Access: PICC placed 12/22/15 TPN start date: 12/23/15  Current Nutrition:  TPN Soft diet - calorie count per RD Boost Plus TID (consumed 2 = 720 kCal and 28 gm protein)  Nutritional Goals: 1400-1600 kCal and 65-80 gm protein per day   Plan: - Spoke to PA, d/c TPN post this bag. - D/C TPN labs and associated orders   Graciana Sessa D. Mina Marble, PharmD, BCPS Pager:  [phone] 12/30/2015, 9:00 AM

## 2015-12-30 NOTE — Clinical Social Work Note (Signed)
CSW met with patient's husband and daughter at bedside. CSW explained residential hospice search and placement process to patient's husband and daughter. Patient's husband and daughter would like the patient placed with [facility]. CSW explained if no beds are available with [facility] that the residential hospice search will need to be expanded outside of [place]. CSW made referral to [facility]. CSW will continue to follow for discharge needs.   [employer], LCSW [phone]

## 2015-12-30 NOTE — Progress Notes (Signed)
Nutrition Brief Note  Chart reviewed. Pt now transitioning to comfort care.  No further nutrition interventions warranted at this time.  Please re-consult as needed.   Damere Brandenburg A. Corion Sherrod, RD, LDN, CDE Pager: [phone] After hours Pager: [phone]  

## 2015-12-30 NOTE — Progress Notes (Signed)
9 Days Post-Op  Subjective: Complains of pain and some nausea. Having loose bm's  Objective: Vital signs in last 24 hours: Temp:  [98.1 F (36.7 C)-98.8 F (37.1 C)] 98.8 F (37.1 C) (10/12 0645) Pulse Rate:  [62-81] 72 (10/12 0645) Resp:  [17-18] 17 (10/12 0645) BP: (117-132)/(65-75) 132/72 (10/12 0645) SpO2:  [96 %-100 %] 96 % (10/12 0645) Last BM Date: 12/28/15  Intake/Output from previous day: 10/11 0701 - 10/12 0700 In: 1603.3 [P.O.:720; I.V.:483.3; IV Piggyback:100] Out: 1590 [Urine:1550; Drains:40] Intake/Output this shift: Total I/O In: -  Out: 910 [Urine:850; Drains:60]  Resp: rhonchi bilaterally Cardio: regular rate and rhythm GI: soft, tender. open wound with exposed mesh and some mucousy exudate  Lab Results:   Recent Labs  12/29/15 0430 12/30/15 0415  WBC 16.2* 14.4*  HGB 7.6* 7.5*  HCT 23.1* 21.9*  PLT 381 430*   BMET  Recent Labs  12/29/15 0430 12/30/15 0415  NA 131* 130*  K 4.8 4.3  CL 99* 98*  CO2 26 27  GLUCOSE 108* 112*  BUN 11 11  CREATININE 0.60 0.67  CALCIUM 8.4* 8.6*   PT/INR No results for input(s): LABPROT, INR in the last 72 hours. ABG No results for input(s): PHART, HCO3 in the last 72 hours.  Invalid input(s): PCO2, PO2  Studies/Results: No results found.  Anti-infectives: Anti-infectives    Start     Dose/Rate Route Frequency Ordered Stop   12/29/15 1300  piperacillin-tazobactam (ZOSYN) IVPB 3.375 g     3.375 g 12.5 mL/hr over 240 Minutes Intravenous Every 8 hours 12/29/15 1016     12/25/15 1300  piperacillin-tazobactam (ZOSYN) IVPB 3.375 g  Status:  Discontinued     3.375 g 12.5 mL/hr over 240 Minutes Intravenous Every 8 hours 12/25/15 1218 12/29/15 1016   12/20/15 1000  ciprofloxacin (CIPRO) IVPB 400 mg  Status:  Discontinued     400 mg 200 mL/hr over 60 Minutes Intravenous 2 times daily 12/20/15 0907 12/25/15 1151   12/20/15 0945  metroNIDAZOLE (FLAGYL) IVPB 500 mg  Status:  Discontinued     500 mg 100  mL/hr over 60 Minutes Intravenous Every 8 hours 12/20/15 0907 12/25/15 1151   12/18/15 0430  ciprofloxacin (CIPRO) IVPB 400 mg  Status:  Discontinued     400 mg 200 mL/hr over 60 Minutes Intravenous 2 times daily 12/18/15 0427 12/20/15 0904   12/18/15 0430  metroNIDAZOLE (FLAGYL) IVPB 500 mg  Status:  Discontinued     500 mg 100 mL/hr over 60 Minutes Intravenous Every 8 hours 12/18/15 0427 12/20/15 0904   12/18/15 0345  piperacillin-tazobactam (ZOSYN) IVPB 3.375 g  Status:  Discontinued     3.375 g 100 mL/hr over 30 Minutes Intravenous  Once 12/18/15 0330 12/18/15 0427      Assessment/Plan: s/p Procedure(s): EXPLORATORY LAPAROTOMY/ REMOVAL OF MESH (N/A) Advance diet. Try boost supplements Needs to work with PT even though she is nonambulatory PNA. Continue zosyn Will need snf when she is stable and ready for discharge but she does not seem to be close at this point.  LOS: 12 days    TOTH III,Justyn Boyson S 12/30/2015

## 2015-12-30 NOTE — Progress Notes (Signed)
Pt.'s flexiseal busted. Had to re-order new one.

## 2015-12-30 NOTE — Progress Notes (Addendum)
No charge note:   Called to patient's room by spouse. Patient awake and alert, oriented x 3. Patient now stating she does not want Hospice care and wants to resume IV antibiotics. Harmon Pier from [facility] saw family and will follow up again as needed, no beds available at [facility] at this time. I spent some time discussing patient's illness trajectory with her and she would like some time to discuss her decision with her family and reconsult with Palliative medicine in the morning on 12/31/15. Will restart IV antibiotics for now.   Mariana Kaufman, AGNP-C Palliative Medicine  Please call Palliative Medicine team phone with any questions [phone]. For individual providers please see AMION.

## 2015-12-30 NOTE — Consult Note (Signed)
Consultation Note Date: 12/30/2015   Patient Name: Monique Russo  DOB: 26-Aug-1930  MRN: [id number]  Age / Sex: 82 y.o., female  PCP: [name], MD Referring Physician: Reyne Dumas, MD  Reason for Consultation: Establishing goals of care  HPI/Patient Profile: 82 y.o. female  with past medical history of perforated bowel, small bowel obstruction, pulmonary arterial hypertension, chronic pain, chronic non healing abdominal surgical wound admitted on 12/17/2015 with abdominal pain and vomiting. Workup revealed Small bowel obstruction. She previously had surgery for perforated ulcer 1 year ago that was complicated by a non healing wound and resultant infection. She required surgery again during this hospitalization for exploratory lap, infected abdominal mesh and small bowel obstruction. Post operative has been complicated by ileus, poor nutritional status requiring TPN, watery diarrhea (C diff negative), pain, and now with chest xray showing likely pneumonia.  Clinical Assessment and Goals of Care: Met with spouse, Monique Russo and daughter, Monique Russo. They note patient has not walked since May, 2016.  She has stopped eating and cannot complete her own ADL's. She has had significant decline in the last six months- no longer able to toilet herself, weight loss of approximately 30 lbs and difficulty to control pain. Her cognition has declined. She has been housebound. She is asleep more than she is awake, but she wakes frequently during the night in pain. She has stopped eating. Their goals are to no longer prolong her suffering and to focus on her comfort. They feel she has no quality of life and do not want to pursue any further treatment measures. Discussed comfort care - DNR/DNI, stop TNA, antibiotics, IV fluids and family feels this is appropriate. They request residential hospice evaluation and placement if possible. This was  also discussed with Dr. Allyson Sabal and she agreed this is appropriate course for this patient.   Primary Decision Maker NEXT OF KIN- spouse- Monique Russo; daughter- Monique Russo     SUMMARY OF RECOMMENDATIONS -DNR -Transition to comfort care -Continue current pain regimen and adjust if needed- adjuvant Zyprexa and mirtazapine started to aid nighttime pain and sleep -Referral for hospice evaluation for residential hospice placement if possible    Code Status/Advance Care Planning:  DNR    Symptom Management:   See above recommendations  Palliative Prophylaxis:   Delirium Protocol, Frequent Pain Assessment and Palliative Wound Care  Additional Recommendations (Limitations, Scope, Preferences):  Avoid Hospitalization, Full Comfort Care, Minimize Medications, Initiate Comfort Feeding, No Artificial Feeding, No Blood Transfusions, No Diagnostics, No Glucose Monitoring, No IV Antibiotics, No IV Fluids, No Lab Draws and No Surgical Procedures  Psycho-social/Spiritual:   Desire for further Chaplaincy support:No  Additional Recommendations: Education on Hospice  Prognosis:    < 2 weeks d/t FTT- (albumin 1.6); no po intake, wt loss- 30 lbs; recurrent wound infection, now developing pneumonia, family desire to transition to full comfort care  Discharge Planning: Hospice facility pending evaluation by hospice liaison and bed availability  Primary Diagnoses: Present on Admission: . Hyponatremia . Chronic pain syndrome . Small bowel obstruction .  Diverticulitis of large intestine without perforation or abscess without bleeding . SBO (small bowel obstruction)   I have reviewed the medical record, interviewed the patient and family, and examined the patient. The following aspects are pertinent.  Past Medical History:  Diagnosis Date  . Arthritis   . CHF (congestive heart failure) ([place])   . Depression   . GERD (gastroesophageal reflux disease)   . Perforated chronic gastric ulcer  ([place]) 08/11/2014  . Pneumonia   . Pulmonary arterial hypertension 11/28/2014   Social History   Social History  . Marital status: Married    Spouse name: N/A  . Number of children: N/A  . Years of education: N/A   Social History Main Topics  . Smoking status: Never Smoker  . Smokeless tobacco: Never Used  . Alcohol use 4.2 oz/week    7 Glasses of wine per week     Comment: 8 oz -12 oz of wine every   . Drug use: No  . Sexual activity: Not Asked   Other Topics Concern  . None   Social History Narrative  . None   Family History  Problem Relation Age of Onset  . Stroke Mother   . Breast cancer Sister   . Ulcerative colitis Daughter   . Chronic fatigue Daughter   . Chronic fatigue Daughter    Scheduled Meds: . DULoxetine  60 mg Oral QHS  . lactose free nutrition  237 mL Oral TID WC  . methadone  5 mg Oral Q8H  . mirtazapine  15 mg Oral QHS  . OLANZapine zydis  5 mg Oral Daily  . pantoprazole  40 mg Oral Daily  . sodium chloride flush  3 mL Intravenous Q12H  . timolol  1 drop Both Eyes QHS  . timolol  2 drop Both Eyes Daily   Continuous Infusions:  PRN Meds:.sodium chloride, acetaminophen **OR** acetaminophen, glycopyrrolate **OR** glycopyrrolate **OR** glycopyrrolate, HYDROmorphone (DILAUDID) injection, LORazepam **OR** LORazepam **OR** LORazepam, methocarbamol, ondansetron (ZOFRAN) IV, oxyCODONE, sodium chloride flush, sodium chloride flush, sodium chloride flush, traZODone Medications Prior to Admission:  Prior to Admission medications   Medication Sig Start Date End Date Taking? Authorizing Provider  acetaminophen (TYLENOL) 325 MG tablet Take 325 mg by mouth every 6 (six) hours as needed for mild pain or headache. 07/10/13  Yes Bobby Rumpf York, PA-C  amoxicillin-clavulanate (AUGMENTIN) 875-125 MG tablet Take 1 tablet by mouth 2 (two) times daily. 12/13/15  Yes Historical Provider, MD  Ascorbic Acid (VITAMIN C) 1000 MG tablet Take 1,000 mg by mouth daily.   Yes  Historical Provider, MD  b complex vitamins tablet Take 1 tablet by mouth daily as needed.    Yes Historical Provider, MD  bisacodyl (DULCOLAX) 10 MG suppository Place 1 suppository (10 mg total) rectally daily as needed for moderate constipation (May repeat times one). 07/10/13  Yes Marianne L York, PA-C  budesonide (RHINOCORT AQUA) 32 MCG/ACT nasal spray Place 1 spray into both nostrils daily as needed for rhinitis.   Yes Historical Provider, MD  cholecalciferol (VITAMIN D) 1000 UNITS tablet Take 1,000 Units by mouth daily.   Yes Historical Provider, MD  diclofenac sodium (VOLTAREN) 1 % GEL Apply topically every three (3) days as needed (For Leg pain). 1 gm   Yes Historical Provider, MD  DULoxetine (CYMBALTA) 60 MG capsule Take 60 mg by mouth daily. At bedtime   Yes Historical Provider, MD  Esomeprazole Magnesium (NEXIUM 24HR PO) Take 22.3 mg by mouth daily.   Yes Historical Provider, MD  furosemide (LASIX) 80 MG tablet Take 80 mg by mouth daily as needed for fluid.    Yes Historical Provider, MD  furosemide (LASIX) 80 MG tablet Take 80 mg by mouth daily as needed for fluid.   Yes Historical Provider, MD  gabapentin (NEURONTIN) 800 MG tablet Take 800 mg by mouth 2 (two) times daily. 09/11/15  Yes Historical Provider, MD  lactose free nutrition (BOOST PLUS) LIQD Take 237 mLs by mouth 2 (two) times daily between meals. 08/12/14  Yes Earnstine Regal, PA-C  Magnesium 400 MG TABS Take 400 mg by mouth daily.   Yes Historical Provider, MD  magnesium hydroxide (MILK OF MAGNESIA) 400 MG/5ML suspension Take 30 mLs by mouth every other day. As needed for constipation   Yes Historical Provider, MD  methadone (DOLOPHINE) 5 MG tablet Take 5 mg by mouth every 8 (eight) hours.    Yes Historical Provider, MD  methocarbamol (ROBAXIN) 750 MG tablet Take 750 mg by mouth every 8 (eight) hours as needed for muscle spasms (Prescribed TID but patient takes PRN).    Yes Historical Provider, MD  Multiple Vitamins-Iron  (MULTIVITAMINS WITH IRON) TABS tablet Take 1 tablet by mouth daily with supper. 08/12/14  Yes Earnstine Regal, PA-C  Omega-3 Fatty Acids (FISH OIL) 1000 MG CPDR Take 1,000 mg by mouth daily.    Yes Historical Provider, MD  oxyCODONE (ROXICODONE) 15 MG immediate release tablet Take 15 mg by mouth every 8 (eight) hours as needed for pain. 12/03/15  Yes Historical Provider, MD  potassium chloride SA (K-DUR,KLOR-CON) 20 MEQ tablet Take 20 mEq by mouth daily as needed (Only takes with lasix).   Yes Historical Provider, MD  temazepam (RESTORIL) 30 MG capsule Take 1 capsule (30 mg total) by mouth at bedtime as needed for sleep. 07/10/13  Yes Marianne L York, PA-C  timolol (BETIMOL) 0.5 % ophthalmic solution Place 1 drop into both eyes 2 (two) times daily.    Yes Historical Provider, MD  vitamin E 400 UNIT capsule Take 800 Units by mouth daily.    Yes Historical Provider, MD   No Known Allergies  ROS- per patient and family Review of Systems  Constitutional: Positive for activity change, fatigue and unexpected weight change.  HENT: Negative.   Eyes: Negative.   Respiratory: Positive for cough. Negative for apnea and chest tightness.   Cardiovascular: Negative for chest pain and palpitations.  Gastrointestinal: Positive for abdominal distention, abdominal pain, constipation, diarrhea, nausea and vomiting.  Endocrine: Negative.   Genitourinary: Negative.   Musculoskeletal: Positive for arthralgias, back pain and myalgias.  Skin: Negative.   Allergic/Immunologic: Negative.   Neurological: Negative.   Hematological: Negative.   Psychiatric/Behavioral: Positive for sleep disturbance. The patient is nervous/anxious.     Physical Exam  Constitutional:  frail  HENT:  Head: Normocephalic and atraumatic.  Eyes: Conjunctivae and EOM are normal.  Cardiovascular: Normal rate and regular rhythm.   Pulmonary/Chest: Effort normal.  diminished  Abdominal: She exhibits distension. There is tenderness.    Large open central abdominal wound, sanguineous drainage on dressing, hypoactive sounds x 4  Genitourinary:  Genitourinary Comments: Foley in place with clear yellow urine draining  Musculoskeletal:  Generalized weakness  Neurological:  Somnolent, arouses easily  Skin: Skin is warm and dry. There is pallor.  Psychiatric:  Flat affect    Vital Signs: BP 132/72 (BP Location: Left Wrist)   Pulse 72   Temp 98.8 F (37.1 C) (Oral)   Resp 17   Ht 5' (1.524 m)  Wt 72.6 kg (160 lb)   SpO2 96%   BMI 31.25 kg/m  Pain Assessment: 0-10 POSS *See Group Information*: 1-Acceptable,Awake and alert Pain Score: 0-No pain   SpO2: SpO2: 96 % O2 Device:SpO2: 96 % O2 Flow Rate: .O2 Flow Rate (L/min): 4 L/min  IO: Intake/output summary:  Intake/Output Summary (Last 24 hours) at 12/30/15 1038 Last data filed at 12/30/15 0704  Gross per 24 hour  Intake          1243.34 ml  Output             2500 ml  Net         -1256.66 ml    LBM: Last BM Date: 12/28/15 Baseline Weight: Weight: 73 kg (161 lb) Most recent weight: Weight: 72.6 kg (160 lb)     Palliative Assessment/Data: PPS: 20%   Flowsheet Rows   Flowsheet Row Most Recent Value  Intake Tab  Referral Department  Hospitalist  Unit at Time of Referral  Med/Surg Unit  Palliative Care Primary Diagnosis  Pain  Date Notified  12/29/15  Palliative Care Type  Return patient Palliative Care  Reason for referral  Pain  Date of Admission  12/17/15  # of days IP prior to Palliative referral  12  Clinical Assessment  Psychosocial & Spiritual Assessment  Palliative Care Outcomes      Thank you for this consult. Palliative medicine will continue to follow and assist as needed.   Time In: 0930 Time Out: 1100 Time Total:90 minutes Greater than 50%  of this time was spent counseling and coordinating care related to the above assessment and plan.  Signed by: Mariana Kaufman, AGNP-C Palliative Medicine    Please contact Palliative  Medicine Team phone at [phone] for questions and concerns.  For individual provider: See Shea Evans

## 2015-12-31 ENCOUNTER — Encounter (HOSPITAL_COMMUNITY): Payer: Self-pay | Admitting: Surgery

## 2015-12-31 LAB — COMPREHENSIVE METABOLIC PANEL
ALBUMIN: 1.4 g/dL — AB (ref 3.5–5.0)
ALT: 8 U/L — ABNORMAL LOW (ref 14–54)
ANION GAP: 6 (ref 5–15)
AST: 16 U/L (ref 15–41)
Alkaline Phosphatase: 94 U/L (ref 38–126)
BUN: 9 mg/dL (ref 6–20)
CHLORIDE: 100 mmol/L — AB (ref 101–111)
CO2: 26 mmol/L (ref 22–32)
Calcium: 8.7 mg/dL — ABNORMAL LOW (ref 8.9–10.3)
Creatinine, Ser: 0.69 mg/dL (ref 0.44–1.00)
GFR calc Af Amer: >60 mL/min (ref >60)
GFR calc non Af Amer: >60 mL/min (ref >60)
GLUCOSE: 97 mg/dL (ref 65–99)
POTASSIUM: 4.7 mmol/L (ref 3.5–5.1)
SODIUM: 132 mmol/L — AB (ref 135–145)
TOTAL PROTEIN: 4.9 g/dL — AB (ref 6.5–8.1)
Total Bilirubin: 0.3 mg/dL (ref 0.3–1.2)

## 2015-12-31 LAB — CBC
HEMATOCRIT: 21.7 % — AB (ref 36.0–46.0)
HEMOGLOBIN: 7.2 g/dL — AB (ref 12.0–15.0)
MCH: 30.3 pg (ref 26.0–34.0)
MCHC: 33.2 g/dL (ref 30.0–36.0)
MCV: 91.2 fL (ref 78.0–100.0)
Platelets: 393 10*3/uL (ref 150–400)
RBC: 2.38 MIL/uL — ABNORMAL LOW (ref 3.87–5.11)
RDW: 14.4 % (ref 11.5–15.5)
WBC: 13.4 10*3/uL — ABNORMAL HIGH (ref 4.0–10.5)

## 2015-12-31 LAB — PREPARE RBC (CROSSMATCH)

## 2015-12-31 LAB — PROCALCITONIN: PROCALCITONIN: 0.14 ng/mL

## 2015-12-31 MED ORDER — SODIUM CHLORIDE 0.9 % IV BOLUS (SEPSIS)
500.0000 mL | Freq: Once | INTRAVENOUS | Status: AC
  Administered 2015-12-31: 500 mL via INTRAVENOUS

## 2015-12-31 MED ORDER — SODIUM CHLORIDE 0.9 % IV SOLN: Freq: Once | INTRAVENOUS | Status: DC

## 2015-12-31 MED ORDER — TEMAZEPAM 15 MG PO CAPS
30.0000 mg | ORAL_CAPSULE | Freq: Every evening | ORAL | Status: DC | PRN
  Administered 2016-01-01 – 2016-01-05 (×5): 30 mg via ORAL
  Filled 2015-12-31 (×5): qty 2

## 2015-12-31 NOTE — Progress Notes (Signed)
SLP Cancellation Note  Patient Details Name: Monique Russo MRN: [id number] DOB: [birth year]   Cancelled treatment:       Reason Eval/Treat Not Completed: Other (comment). Pt sleeping, did not want to eat/drink. Reiterated precautions and signs of aspiration with daughter.  Will sign off at this time. If further concerns arise please reorder.    Neizan Debruhl, Katherene Ponto 12/31/2015, 11:19 AM

## 2015-12-31 NOTE — Progress Notes (Signed)
Nutrition Follow-up  DOCUMENTATION CODES:   Obesity unspecified  INTERVENTION:    Continue Snacks TID  Continue Boost Plus PO TID, each supplement provides 360 kcal and 14 gm protein  NUTRITION DIAGNOSIS:   Inadequate oral intake related to poor appetite as evidenced by per patient/family report.  Ongoing  GOAL:   Patient will meet greater than or equal to 90% of their needs  Progressing  MONITOR:   PO intake, Supplement acceptance, Labs, Weight trends  REASON FOR ASSESSMENT:   Consult New TPN/TNA  ASSESSMENT:   Monique Russo is a 82 y.o. female with a past medical history significant for chronic pain on methadone and chronic non-healing surgical wound who presents with abdominal pain and vomiting for 3 days.  Per Palliative Care team note, pt and family now requesting aggressive measures for pt. Pt no longer on comfort measures.   RD received consult for new TPN. Reviewed pharmacy note; TPN was not started due to functioning GI tract.   Case discussed with SLP. Pt does not eat well at baseline, related to somnolence. Pt also has hx of reflux; aspiration and reflux precautions were reviewed with daughter per SLP.   Reviewed calorie count data, which was incomplete. Pt is accepting Boost Plus supplements; RD will continue to support nutritional adequacy.   Calorie count data: Day 1 Breakfast: 155 kcals, 5 grams protein Lunch: 250 kcals, 11 grams protein Dinner: 249 kcals, 10 grams protein Supplements: 2 Boost Plus supplements (one refusal), 720 kcals, 32 grams protein  Total intake (PO's only): 1374 kcal (98% of minimum estimated needs)  58 grams protein (89% of minimum estimated needs)  Day 2 Breakfast: 40 kcals, 1 gram protein Lunch: 60 kcals, 0 grams protein Dinner: 249 kcals, 10 grams protein Supplements: 2 Boost Plus supplements (one refusal), 720 kcals, 32 grams protein  Total intake (PO's only): 1069 kcal (76% of minimum estimated needs)   43 grams protein (66% of minimum estimated needs)  Average total intake: 1222 kcals, 51 grams protein (87% estimated kcal needs,78% of estimated protein needs)  Labs reviewed: CBGS: 133, Na: 132.   Diet Order:  DIET SOFT Room service appropriate? Yes; Fluid consistency: Thin  Skin:  Reviewed, no issues  Last BM:  12/31/15  Height:   Ht Readings from Last 1 Encounters:  12/18/15 5' (1.524 m)    Weight:   Wt Readings from Last 1 Encounters:  12/31/15 197 lb 8.5 oz (89.6 kg)    Ideal Body Weight:  45.5 kg  BMI:  Body mass index is 38.58 kg/m.  Estimated Nutritional Needs:   Kcal:  1400-1600  Protein:  65-80 grams  Fluid:  1.4-1.5 L  EDUCATION NEEDS:   No education needs identified at this time  Jenifer A. Jimmye Norman, RD, LDN, CDE Pager: [phone] After hours Pager: [phone]

## 2015-12-31 NOTE — Progress Notes (Signed)
PARENTERAL NUTRITION CONSULT NOTE - FOLLOW UP  Pharmacy Consult: resume TPN  Patient decided not to pursue comfort care. TPN discontinued 10/12. She has gut/bowel function and is capable of eating. Clinimix bags are also in short supply currently. No strong indication for TPN noted. Discussed this with both Kathlee Nations, [place] from surgery and Dr. Allyson Sabal and both agreed not to resume TPN.  Soft diet + Boost Plus will meet at least 60% of patients kCal and protein needs.  Current Nutrition:  Soft diet - calorie count per RD Boost Plus TID (360 kCal and 14 g protein/bottle) - consumed all 3 on 10/12  Nutritional Goals: 1400-1600 kCal and 65-80 gm protein per day Ordered Boost Plus TID with meals   Plan: - Spoke with RN and encouraged giving Boost Plus and rescheduling time if patient alseep - Pharmacy signing off, please re-consult if needed   Renold Genta, PharmD, BCPS Clinical Pharmacist Phone for today - [place] - [phone] 12/31/2015 9:37 AM

## 2015-12-31 NOTE — Consult Note (Signed)
Reason for Consult: Begin and maintain Wound Vac Wound type: Full thickness post-op wound to middle abd Measurement: 11X6X2.2cm; undermining to wound edges from 3 to 5 cm all around except at the 1 o'clock 6cm and at t he 4 o'clock was 9cm when swab inserted. Wound bed: Exposed tan-colored mesh with staples above and below open wound Drainage (amount, consistency, odor) Mod amt reddish drainage, malodorous Periwound: Intact skin surrounding, staples well-approximated Dressing procedure/placement/frequency: Mepitel to protect mesh, 1 piece of white foam, 1 piece of black foam, suction to 140mm/hg pt tolerated fair. [place] team will provide Vac changes on M-W-F. We will follow this patient and remain available to this patient, nursing, and the medical and surgical teams.    Fara Olden, RN-C, WTA-C Wound Treatment Associate

## 2015-12-31 NOTE — Progress Notes (Signed)
Pt BP 88/68 after giving dilaudid for pain paged on call MD waiting for return call, endorsed to am shift, pt alert and responsive, daughter at the bedside.

## 2015-12-31 NOTE — Progress Notes (Signed)
10 Days Post-Op  Subjective: Patient quite somnolent today. Slept through exam and did not answer any questions. Had very pleasant conversation with patient's daughter who mentions that as of yesterday, patient did not wish to pursue Hospice/Palliative care.   Objective: Vital signs in last 24 hours: Temp:  [99.1 F (37.3 C)-99.8 F (37.7 C)] 99.8 F (37.7 C) (10/13 0631) Pulse Rate:  [89-95] 89 (10/13 0631) Resp:  [16] 16 (10/13 0631) BP: (88-99)/(51-68) 88/68 (10/13 0700) SpO2:  [98 %-99 %] 99 % (10/13 0631) Last BM Date: 12/30/15  Intake/Output from previous day: 10/12 0701 - 10/13 0700 In: 223 [P.O.:120; I.V.:3; IV Piggyback:100] Out: [id number] [Urine:3225; Drains:120] Intake/Output this shift: No intake/output data recorded.  PE: General: elderly, deconditioned white female sleeping in bed Heart: regular, rate, and rhythm.  Lungs: scattered rhonchi bilaterally with decrease BS at bases Abd: soft with expected wound tenderness, large open wound at center of abdomen with exposed mesh and moderate amount of thin foul smelling grey drainage. One JP drain in place at right abdomen.    Lab Results:   Recent Labs  12/30/15 0415 12/31/15 0420  WBC 14.4* 13.4*  HGB 7.5* 7.2*  HCT 21.9* 21.7*  PLT 430* 393   BMET  Recent Labs  12/30/15 0415 12/31/15 0420  NA 130* 132*  K 4.3 4.7  CL 98* 100*  CO2 27 26  GLUCOSE 112* 97  BUN 11 9  CREATININE 0.67 0.69  CALCIUM 8.6* 8.7*   PT/INR No results for input(s): LABPROT, INR in the last 72 hours. CMP     Component Value Date/Time   NA 132 (L) 12/31/2015 0420   K 4.7 12/31/2015 0420   CL 100 (L) 12/31/2015 0420   CO2 26 12/31/2015 0420   GLUCOSE 97 12/31/2015 0420   BUN 9 12/31/2015 0420   CREATININE 0.69 12/31/2015 0420   CALCIUM 8.7 (L) 12/31/2015 0420   PROT 4.9 (L) 12/31/2015 0420   ALBUMIN 1.4 (L) 12/31/2015 0420   AST 16 12/31/2015 0420   ALT 8 (L) 12/31/2015 0420   ALKPHOS 94 12/31/2015 0420   BILITOT 0.3  12/31/2015 0420   GFRNONAA >60 12/31/2015 0420   GFRAA >60 12/31/2015 0420   Lipase     Component Value Date/Time   LIPASE 12 (L) 08/06/2014 2020       Studies/Results: No results found.  Anti-infectives: Anti-infectives    Start     Dose/Rate Route Frequency Ordered Stop   12/30/15 1500  piperacillin-tazobactam (ZOSYN) IVPB 3.375 g     3.375 g 12.5 mL/hr over 240 Minutes Intravenous Every 8 hours 12/30/15 1405     12/29/15 1300  piperacillin-tazobactam (ZOSYN) IVPB 3.375 g  Status:  Discontinued     3.375 g 12.5 mL/hr over 240 Minutes Intravenous Every 8 hours 12/29/15 1016 12/30/15 1032   12/25/15 1300  piperacillin-tazobactam (ZOSYN) IVPB 3.375 g  Status:  Discontinued     3.375 g 12.5 mL/hr over 240 Minutes Intravenous Every 8 hours 12/25/15 1218 12/29/15 1016   12/20/15 1000  ciprofloxacin (CIPRO) IVPB 400 mg  Status:  Discontinued     400 mg 200 mL/hr over 60 Minutes Intravenous 2 times daily 12/20/15 0907 12/25/15 1151   12/20/15 0945  metroNIDAZOLE (FLAGYL) IVPB 500 mg  Status:  Discontinued     500 mg 100 mL/hr over 60 Minutes Intravenous Every 8 hours 12/20/15 0907 12/25/15 1151   12/18/15 0430  ciprofloxacin (CIPRO) IVPB 400 mg  Status:  Discontinued     400  mg 200 mL/hr over 60 Minutes Intravenous 2 times daily 12/18/15 0427 12/20/15 0904   12/18/15 0430  metroNIDAZOLE (FLAGYL) IVPB 500 mg  Status:  Discontinued     500 mg 100 mL/hr over 60 Minutes Intravenous Every 8 hours 12/18/15 0427 12/20/15 0904   12/18/15 0345  piperacillin-tazobactam (ZOSYN) IVPB 3.375 g  Status:  Discontinued     3.375 g 100 mL/hr over 30 Minutes Intravenous  Once 12/18/15 0330 12/18/15 0427       Assessment/Plan s/p Procedure(s): EXPLORATORY LAPAROTOMY/ REMOVAL OF MESH (N/A) Advance diet. Try boost supplements.  Needs to work with PT even though she is nonambulatory PNA. Continue zosyn Heme: persistent anemia with episodes of hypotension this morning. Consider discussion with  family regarding risks/benefits of blood transfusion. Will obtain WOC consult for wound vac placement with white sponge.   Will need snf when she is stable and ready for discharge but she does not seem to be close at this point.   LOS: 13 days    LEE Nocholas Damaso, [facility] 12/31/2015, 8:11 AM

## 2015-12-31 NOTE — Progress Notes (Addendum)
Daily Progress Note   Patient Name: Monique Russo       Date: 12/31/2015 DOB: 08-Nov-1930  Age: 82 y.o. MRN#: [id number] Attending Physician: Reyne Dumas, MD Primary Care Physician: Horatio Pel, MD Admit Date: 12/17/2015  Reason for Consultation/Follow-up: Establishing goals of care, Non pain symptom management and Pain control  Subjective: Patient sleeping this morning. Per daughter she discussed with patient last night and patient wishes to proceed with full scope of care. Daughter states patient slept better last night and is eating better this morning. Daughter states patient's pain is better controlled, however, per chart review pt required IV Dilaudid this a.m.  Review of Systems  Unable to perform ROS: Other    Length of Stay: 13  Current Medications: Scheduled Meds:  . sodium chloride   Intravenous Once  . DULoxetine  60 mg Oral QHS  . enoxaparin (LOVENOX) injection  40 mg Subcutaneous Q24H  . lactose free nutrition  237 mL Oral TID WC  . methadone  5 mg Oral Q8H  . pantoprazole  40 mg Oral Daily  . piperacillin-tazobactam (ZOSYN)  IV  3.375 g Intravenous Q8H  . sodium chloride flush  3 mL Intravenous Q12H  . timolol  1 drop Both Eyes QHS  . timolol  2 drop Both Eyes Daily    Continuous Infusions:    PRN Meds: sodium chloride, acetaminophen **OR** acetaminophen, HYDROmorphone (DILAUDID) injection, methocarbamol, ondansetron (ZOFRAN) IV, oxyCODONE, sodium chloride flush, sodium chloride flush, sodium chloride flush, traZODone  Physical Exam  Constitutional:  Thin, frail, cachexic  Cardiovascular: Normal rate and regular rhythm.   Pulmonary/Chest: Effort normal. She has rales.  Abdominal: Soft. There is tenderness.  Open surgical wound, dressing in  place  Neurological:  Sleepy, arouseable  Skin: Skin is warm and dry.            Vital Signs: BP (!) 83/45 (BP Location: Left Wrist)   Pulse 84   Temp 99.8 F (37.7 C) (Axillary)   Resp 16   Ht 5' (1.524 m)   Wt 72.6 kg (160 lb)   SpO2 99%   BMI 31.25 kg/m  SpO2: SpO2: 99 % O2 Device: O2 Device: Nasal Cannula O2 Flow Rate: O2 Flow Rate (L/min): 4 L/min  Intake/output summary:  Intake/Output Summary (Last 24 hours) at 12/31/15 1306 Last data filed  at 12/31/15 1257  Gross per 24 hour  Intake              460 ml  Output             2680 ml  Net            -2220 ml   LBM: Last BM Date: 12/30/15 Baseline Weight: Weight: 73 kg (161 lb) Most recent weight: Weight: 72.6 kg (160 lb)       Palliative Assessment/Data: PPS: 30%   Flowsheet Rows   Flowsheet Row Most Recent Value  Intake Tab  Referral Department  Hospitalist  Unit at Time of Referral  Med/Surg Unit  Palliative Care Primary Diagnosis  Pain  Date Notified  12/29/15  Palliative Care Type  Return patient Palliative Care  Reason for referral  Pain  Date of Admission  12/17/15  # of days IP prior to Palliative referral  12  Clinical Assessment  Psychosocial & Spiritual Assessment  Palliative Care Outcomes      Patient Active Problem List   Diagnosis Date Noted  . Difficulty in walking, not elsewhere classified   . Disorder of sleep wake schedule   . Protein-calorie malnutrition, severe ([place])   . End of life care   . Goals of care, counseling/discussion   . Palliative care by specialist   . Chronic bilateral low back pain without sciatica   . Small bowel obstruction 12/18/2015  . Diverticulitis of large intestine without perforation or abscess without bleeding 12/18/2015  . SBO (small bowel obstruction) 12/18/2015  . Left ventricular dysfunction  reported in chart, not confirmed on review of echo 11/28/2014  . Pulmonary arterial hypertension 11/28/2014  . Perforated chronic gastric ulcer ([place]) 08/11/2014   . Encounter for intubation   . Chronic pain syndrome   . Hyponatremia 07/17/2014  . DNR (do not resuscitate) 07/07/2013  . Palliative care encounter 07/07/2013  . Narcotic overdose 07/03/2013    Palliative Care Assessment & Plan   Patient Profile:  83 y.o. female  with past medical history of perforated bowel, small bowel obstruction, pulmonary arterial hypertension, chronic pain, chronic non healing abdominal surgical wound admitted on 12/17/2015 with abdominal pain and vomiting. Workup revealed Small bowel obstruction. She previously had surgery for perforated ulcer 1 year ago that was complicated by a non healing wound and resultant infection. She required surgery again during this hospitalization for exploratory lap, infected abdominal mesh and small bowel obstruction. Post operative has been complicated by ileus, poor nutritional status, watery diarrhea (C diff negative), pain, and now with chest xray showing likely pneumonia. TPN discontinued per surgery d/t patient with working gut and able to have po intake.  Assessment/Recommendations/Plan   Pain- appears better controlled with adjuvant Zyprexa  Cachexia/Severe protein calorie malnutrition-  Albumin 1.4 (high indicator of mortality), patient with improved appetite with adjuvant Zyprexa  Day/Night sleep cycle interruption- improved since starting Zyprexa  Daughter feels Zyprexa is sedating her mom, which is possible as somnolence can be a side effect, and wishes to dc so will cancel. Daughter requests starting temazepam 30mg  po nightly for sleep- will defer to hospitalists.  Palliative medicine will monitor chart and intervene if needed.    Goals of Care and Additional Recommendations:  Limitations on Scope of Treatment: Full Scope Treatment  Code Status:  DNR  Prognosis:   < 3 months  Discharge Planning:  To Be Determined  Care plan was discussed with daughter, Gay Filler and patient.  Thank you for allowing the  Palliative Medicine Team to assist in the care of this patient.   Time In: 1000 Time Out: 1030 Total Time 30 mins Prolonged Time Billed No      Greater than 50%  of this time was spent counseling and coordinating care related to the above assessment and plan.  Mariana Kaufman, AGNP-C Palliative Medicine   Please contact Palliative Medicine Team phone at [phone] for questions and concerns.

## 2015-12-31 NOTE — Progress Notes (Signed)
PROGRESS NOTE  Monique Russo [id number] DOB: [birth year] DOA: 12/17/2015 PCP: Monique Pel, MD   LOS: 13 days   Brief Narrative: Monique Russo a 81 y.o.femalewith a past medical history significant for chronic pain on methadone and chronic non-healing surgical woundwho presented with abdominal pain and vomiting for 3 days. The patient had a perforated ulcer 1 year ago requiring Monique Russo patch.This surgery was unfortunately complicated by nonhealing surgical wound (because of 82 year old infected hernia repair mesh?), Which for the last year the patient has been having wound care by her general surgeon in the office every six weeks (per family). This week, two days prior to admission, the patient had an office debridement and described that Dr. Excell Russo did his usual debridement, after which she began to develop nausea and LLQ abdominal pain. This crampy, severe, LLQ aching abdominal pain continued intermittently and was associated with several episodes of nonbloody emesis and obstipation.   On the morning of admission, she was recommended by surgery office for ER evaluation. CT of the abdomen and pelvis was obtained that showed small bowel obstruction with transition point in the right mid abdomenand stranding around the colon, consistent with diverticulitis. Surgery was consulted and patient was admitted for further workup. Patient's condition did not improve and she underwent ExLap, LOA, removal of prior mesh and complex abdominal wall closure and hernia repair with biologic mesh on 10/3. Postoperative course was initially complicated by mild ileus, and patient needed to be started on TPN, however she is improving now, and her diet is advanced. On 10/9, patient started developing significant watery diarrhea requiring flexi-seal. C diff is checked and returned negative.   Assessment & Plan: Principal Problem:   Small bowel obstruction Active Problems:   Hyponatremia  Chronic pain syndrome   Diverticulitis of large intestine without perforation or abscess without bleeding   SBO (small bowel obstruction)   Chronic bilateral low back pain without sciatica   Difficulty in walking, not elsewhere classified   Disorder of sleep wake schedule   Protein-calorie malnutrition, severe ([facility])   End of life care   Goals of care, counseling/discussion   Palliative care by specialist   Small bowel obstruction Last documented bowel movement 10/10, however abdominal x-ray 10/3 showed persistent high-grade small bowel obstruction, complicated surgical history, very poor nutritional status - general surgery, following, patient underwent ExLap, LOA, removal of prior mesh and complex abdominal wall closure and hernia repair with biologic mesh on 10/3. On TPN, boost, being managed by surgery According to general surgery patient is not stable for discharge, currently also on TPN  RLQ drain 60cc/24hr - Appreciate WOC recommendations: Mepitel to protect mesh, hydrogel and gauze to promote moist healing. Surgical team to make further recommendations regarding wound care, expected for wound healing given extremely poor nutritional status  Imodium discontinued, patient still not eating or drinking according to the daughter who is by the bedside At this point she does not want to pursue palliative care but is still a DO NOT RESUSCITATE WOC consult for wound vac placement with white sponge  Diarrhea - Patient developed significant amounts of watery diarrhea on 10/9, requiring a Flexi Seal placement, C. difficile  negative Advanced at by general surgery recommendations  Hypotension multifactorial, could be secondary to poor oral intake, underlying infection, minimize narcotics Minimize Ativan, status post normal saline bolus, probably needs to be transfused to help her with her blood pressure  Diverticulitis - There is stranding around the colon that suggests diverticulitis. -  was on  Ciprofloxacin and Flagyl IV for 8 days. Now on Zosyn, continue Zosyn for now due to slow improvement in her white count   Aspiration PNA vs HCAP,  - SLP evaluation ordered, discussed at length with daughter, she understands aspiration risk - CXR with developing PNA, last chest x-ray was done on 10/7  - started Zosyn which will have good intra-abdominal coverage as well as HCAP - patient complains of productive cough on 10/7, improving now   Hyponatremia - Improving    Chronic pain syndrome Continue home dose of methadone Medications that can help with opioid withdrawal symptoms during a taper include clonidine, hydroxyzine, Tylenol or ibuprofen, hyocosamine, ondansetron or Phenergan, and fluids.  - pain improved now.  - Discussed with social worker regarding methadone and fact that almost all skilled nursing facilities will not take a patient on methadone and she will clearly require SNF level care.  She does not seem to be a candidate for LTAC per case manager QTC 450, continue methadone at home dose without tapering   GERD and hiatal hernia - Replace PPI with IV famotidine while NPO  - DG barium esophagus showed marked lower esophageal tortuosity with large hiatal hernia but no mass. For definitive imaging, patient will need EGD but this can be done at a later date. This may contribute to her aspiration / reflux, discussed with daughter at bedside 10/6 That she will need close outpatient gastroenterology follow-up once her acute illness is resolved  Acute hypoxic respiratory failure - post op requiring 4L [place], unable to wean oxygen - CXR without overt pulmonary edema 10/4 however developing infiltrate 10/7. Abx as above - closely monitor respiratory status  Anemia of chronic disease  Hg dropped from 13.9>7.2, Likely due to poor nutritional status, will transfuse 2 units of packed red blood cells, daughter are agreeable   DVT prophylaxis: SCD Code Status: Full Family  Communication: d/w daughter at bedside Disposition Plan: remain inpatient, DO NOT RESUSCITATE, continue current measures   Consultants:   General surgery  Palliative care  Procedures:   ExLap, LOA, removal of prior mesh and complex abdominal wall closure and hernia repair with biologic mesh on 10/3.  Foley: yes  Antimicrobials:  Ciprofloxacin 9/30 >> 10/7  Flagyl 9/30 >> 10/7  Zosyn 10/7 >>  Subjective:  Patient is awake, interactive, continues to have poor oral intake, drinking  Sips of boost  Objective: Vitals:   12/30/15 2216 12/31/15 0631 12/31/15 0700 12/31/15 0855  BP: (!) 99/59 (!) 88/51 (!) 88/68 (!) 83/45  Pulse: 95 89  84  Resp: 16 16    Temp: 99.1 F (37.3 C) 99.8 F (37.7 C)    TempSrc: Oral Axillary    SpO2: 98% 99%  99%  Weight:      Height:        Intake/Output Summary (Last 24 hours) at 12/31/15 0858 Last data filed at 12/31/15 [id number]  Gross per 24 hour  Intake              223 ml  Output             2435 ml  Net            -2212 ml   Filed Weights   12/17/15 2249 12/18/15 1238  Weight: 73 kg (161 lb) 72.6 kg (160 lb)    Examination: Constitutional: in pain,appears distressed Vitals:   12/30/15 2216 12/31/15 0631 12/31/15 0700 12/31/15 0855  BP: (!) 99/59 (!) 88/51 (!) 88/68 (!) 83/45  Pulse:  95 89  84  Resp: 16 16    Temp: 99.1 F (37.3 C) 99.8 F (37.7 C)    TempSrc: Oral Axillary    SpO2: 98% 99%  99%  Weight:      Height:       Eyes: PERRL Neck: normal, supple Respiratory: no wheezing, gurgling sounds Cardiovascular: Regular rate and rhythm, no murmurs / rubs / gallops. No LE edema. Abdomen: dressing in place, C/D/I Musculoskeletal: no clubbing / cyanosis. Neurologic: non focal    Data Reviewed: I have personally reviewed following labs and imaging studies  CBC:  Recent Labs Lab 12/27/15 0313 12/28/15 1030 12/29/15 0430 12/30/15 0415 12/31/15 0420  WBC 19.7* 19.4* 16.2* 14.4* 13.4*  NEUTROABS 15.1*  --   --    --   --   HGB 8.1* 7.9* 7.6* 7.5* 7.2*  HCT 25.2* 23.8* 23.1* 21.9* 21.7*  MCV 92.6 90.8 90.9 92.0 91.2  PLT 381 385 381 430* [id number]   Basic Metabolic Panel:  Recent Labs Lab 12/25/15 0500 12/26/15 1100 12/27/15 0313 12/28/15 0544 12/29/15 0430 12/30/15 0415 12/31/15 0420  NA 138 131* 132* 130* 131* 130* 132*  K 3.8 3.8 4.3 4.7 4.8 4.3 4.7  CL 108 102 102 99* 99* 98* 100*  CO2 25 23 25 24 26 27 26   GLUCOSE 125* 139* 113* 123* 108* 112* 97  BUN 8 9 12 12 11 11 9   CREATININE 0.57 0.54 0.57 0.56 0.60 0.67 0.69  CALCIUM 8.6* 8.3* 8.2* 8.3* 8.4* 8.6* 8.7*  MG 2.0 1.8 2.1 1.9  --  1.8  --   PHOS 2.9 2.6 3.3 3.8  --  3.8  --    GFR: Estimated Creatinine Clearance: 47.4 mL/min (by C-G formula based on SCr of 0.69 mg/dL). Liver Function Tests:  Recent Labs Lab 12/25/15 0500 12/27/15 0313 12/29/15 0430 12/30/15 0415 12/31/15 0420  AST 11* 14* 14* 13* 16  ALT 7* 7* 8* 8* 8*  ALKPHOS 41 62 55 58 94  BILITOT 0.3 0.2* 0.2* 0.2* 0.3  PROT 4.9* 4.6* 4.9* 4.9* 4.9*  ALBUMIN 1.6* 1.5* 1.3* 1.5* 1.4*   CBG:  Recent Labs Lab 12/27/15 1747 12/28/15 0629 12/28/15 1151 12/30/15 0733 12/30/15 1237  GLUCAP 113* 127* 143* 103* 133*   Lipid Profile: No results for input(s): CHOL, HDL, LDLCALC, TRIG, CHOLHDL, LDLDIRECT in the last 72 hours. Thyroid Function Tests: No results for input(s): TSH, T4TOTAL, FREET4, T3FREE, THYROIDAB in the last 72 hours. Anemia Panel: No results for input(s): VITAMINB12, FOLATE, FERRITIN, TIBC, IRON, RETICCTPCT in the last 72 hours. Urine analysis:    Component Value Date/Time   COLORURINE AMBER (A) 12/18/2015 1507   APPEARANCEUR CLEAR 12/18/2015 1507   LABSPEC >1.046 (H) 12/18/2015 1507   PHURINE 5.5 12/18/2015 1507   GLUCOSEU NEGATIVE 12/18/2015 1507   HGBUR NEGATIVE 12/18/2015 1507   BILIRUBINUR SMALL (A) 12/18/2015 1507   KETONESUR NEGATIVE 12/18/2015 1507   PROTEINUR NEGATIVE 12/18/2015 1507   UROBILINOGEN 0.2 08/04/2014 2100   NITRITE  NEGATIVE 12/18/2015 1507   LEUKOCYTESUR NEGATIVE 12/18/2015 1507   Sepsis Labs: Invalid input(s): PROCALCITONIN, LACTICIDVEN  Recent Results (from the past 240 hour(s))  C difficile quick scan w PCR reflex     Status: None   Collection Time: 12/27/15  1:52 PM  Result Value Ref Range Status   C Diff antigen NEGATIVE NEGATIVE Final   C Diff toxin NEGATIVE NEGATIVE Final   C Diff interpretation Negative for C. difficile  Final  Radiology Studies: No results found.   Scheduled Meds: . sodium chloride   Intravenous Once  . DULoxetine  60 mg Oral QHS  . enoxaparin (LOVENOX) injection  40 mg Subcutaneous Q24H  . lactose free nutrition  237 mL Oral TID WC  . methadone  5 mg Oral Q8H  . OLANZapine zydis  5 mg Oral Daily  . pantoprazole  40 mg Oral Daily  . piperacillin-tazobactam (ZOSYN)  IV  3.375 g Intravenous Q8H  . sodium chloride flush  3 mL Intravenous Q12H  . timolol  1 drop Both Eyes QHS  . timolol  2 drop Both Eyes Daily   Continuous Infusions:    Marzetta Board, MD, PhD [facility] Pager [phone]  If 7PM-7AM, please contact night-coverage [email or web address] Password [email or web address] 12/31/2015, 8:58 AM

## 2016-01-01 DIAGNOSIS — G4720 Circadian rhythm sleep disorder, unspecified type: Secondary | ICD-10-CM

## 2016-01-01 LAB — COMPREHENSIVE METABOLIC PANEL
ALBUMIN: 1.5 g/dL — AB (ref 3.5–5.0)
ALT: 10 U/L — ABNORMAL LOW (ref 14–54)
ANION GAP: 5 (ref 5–15)
AST: 14 U/L — ABNORMAL LOW (ref 15–41)
Alkaline Phosphatase: 94 U/L (ref 38–126)
BUN: 9 mg/dL (ref 6–20)
CO2: 27 mmol/L (ref 22–32)
Calcium: 8.8 mg/dL — ABNORMAL LOW (ref 8.9–10.3)
Chloride: 101 mmol/L (ref 101–111)
Creatinine, Ser: 0.62 mg/dL (ref 0.44–1.00)
GFR calc Af Amer: >60 mL/min (ref >60)
GFR calc non Af Amer: >60 mL/min (ref >60)
GLUCOSE: 102 mg/dL — AB (ref 65–99)
POTASSIUM: 4.1 mmol/L (ref 3.5–5.1)
SODIUM: 133 mmol/L — AB (ref 135–145)
TOTAL PROTEIN: 5.1 g/dL — AB (ref 6.5–8.1)
Total Bilirubin: 0.5 mg/dL (ref 0.3–1.2)

## 2016-01-01 LAB — TYPE AND SCREEN
ABO/RH(D): A NEG
Antibody Screen: NEGATIVE
UNIT DIVISION: 0
Unit division: 0

## 2016-01-01 LAB — CBC
HCT: 27.3 % — ABNORMAL LOW (ref 36.0–46.0)
HEMOGLOBIN: 9.1 g/dL — AB (ref 12.0–15.0)
MCH: 30.3 pg (ref 26.0–34.0)
MCHC: 33.3 g/dL (ref 30.0–36.0)
MCV: 91 fL (ref 78.0–100.0)
PLATELETS: 404 10*3/uL — AB (ref 150–400)
RBC: 3 MIL/uL — AB (ref 3.87–5.11)
RDW: 14.2 % (ref 11.5–15.5)
WBC: 11.2 10*3/uL — ABNORMAL HIGH (ref 4.0–10.5)

## 2016-01-01 LAB — PREALBUMIN: Prealbumin: 9.1 mg/dL — ABNORMAL LOW (ref 18–38)

## 2016-01-01 LAB — TRIGLYCERIDES: TRIGLYCERIDES: 95 mg/dL (ref <150)

## 2016-01-01 LAB — MAGNESIUM: Magnesium: 1.8 mg/dL (ref 1.7–2.4)

## 2016-01-01 LAB — PHOSPHORUS: PHOSPHORUS: 3.4 mg/dL (ref 2.5–4.6)

## 2016-01-01 MED ORDER — METHOCARBAMOL 500 MG PO TABS
500.0000 mg | ORAL_TABLET | Freq: Three times a day (TID) | ORAL | Status: DC | PRN
  Administered 2016-01-01 – 2016-01-05 (×9): 500 mg via ORAL
  Filled 2016-01-01 (×9): qty 1

## 2016-01-01 MED ORDER — OXYCODONE HCL 5 MG PO TABS
5.0000 mg | ORAL_TABLET | Freq: Four times a day (QID) | ORAL | Status: DC | PRN
  Administered 2016-01-01 – 2016-01-05 (×13): 5 mg via ORAL
  Filled 2016-01-01 (×13): qty 1

## 2016-01-01 MED ORDER — GI COCKTAIL ~~LOC~~
30.0000 mL | Freq: Once | ORAL | Status: AC
  Administered 2016-01-02: 30 mL via ORAL
  Filled 2016-01-01: qty 30

## 2016-01-01 NOTE — Progress Notes (Signed)
Pt stated she did not want the flexiseal anymore and to take it out.  BM leaking around flexiseal and not going through the flexiseal.  DC'd flexiseal. Sacral foam placed on sacral area.  Skin is reddened.  Cleansed and patted dry. Foley still in.

## 2016-01-01 NOTE — Progress Notes (Signed)
Progress Note: General Surgery Service   Subjective: Complaining ofworsening back pain. Tolerated some breakfast this morning. +BM today  Objective: Vital signs in last 24 hours: Temp:  [97.8 F (36.6 C)-99.1 F (37.3 C)] 98.3 F (36.8 C) (10/14 0551) Pulse Rate:  [69-91] 83 (10/14 0551) Resp:  [16-18] 18 (10/13 2145) BP: (91-150)/(48-60) 150/60 (10/14 0551) SpO2:  [90 %-100 %] 94 % (10/14 0551) Last BM Date: 12/31/15  Intake/Output from previous day: 10/13 0701 - 10/14 0700 In: 1733.3 [P.O.:477; I.V.:103; Blood:1053.3; IV Piggyback:100] Out: 1760 [Urine:1525; Drains:235] Intake/Output this shift: Total I/O In: -  Out: 2 [Urine:1; Stool:1]  Lungs: CTAB  Cardiovascular: RRR  Abd: soft, vac in place, appropr tender to palpation  Extremities: no edema  Neuro: AOx4  Lab Results: CBC   Recent Labs  12/31/15 0420 01/01/16 0530  WBC 13.4* 11.2*  HGB 7.2* 9.1*  HCT 21.7* 27.3*  PLT 393 404*   BMET  Recent Labs  12/31/15 0420 01/01/16 0530  NA 132* 133*  K 4.7 4.1  CL 100* 101  CO2 26 27  GLUCOSE 97 102*  BUN 9 9  CREATININE 0.69 0.62  CALCIUM 8.7* 8.8*   PT/INR No results for input(s): LABPROT, INR in the last 72 hours. ABG No results for input(s): PHART, HCO3 in the last 72 hours.  Invalid input(s): PCO2, PO2  Studies/Results:  Anti-infectives: Anti-infectives    Start     Dose/Rate Route Frequency Ordered Stop   12/30/15 1500  piperacillin-tazobactam (ZOSYN) IVPB 3.375 g     3.375 g 12.5 mL/hr over 240 Minutes Intravenous Every 8 hours 12/30/15 1405     12/29/15 1300  piperacillin-tazobactam (ZOSYN) IVPB 3.375 g  Status:  Discontinued     3.375 g 12.5 mL/hr over 240 Minutes Intravenous Every 8 hours 12/29/15 1016 12/30/15 1032   12/25/15 1300  piperacillin-tazobactam (ZOSYN) IVPB 3.375 g  Status:  Discontinued     3.375 g 12.5 mL/hr over 240 Minutes Intravenous Every 8 hours 12/25/15 1218 12/29/15 1016   12/20/15 1000  ciprofloxacin  (CIPRO) IVPB 400 mg  Status:  Discontinued     400 mg 200 mL/hr over 60 Minutes Intravenous 2 times daily 12/20/15 0907 12/25/15 1151   12/20/15 0945  metroNIDAZOLE (FLAGYL) IVPB 500 mg  Status:  Discontinued     500 mg 100 mL/hr over 60 Minutes Intravenous Every 8 hours 12/20/15 0907 12/25/15 1151   12/18/15 0430  ciprofloxacin (CIPRO) IVPB 400 mg  Status:  Discontinued     400 mg 200 mL/hr over 60 Minutes Intravenous 2 times daily 12/18/15 0427 12/20/15 0904   12/18/15 0430  metroNIDAZOLE (FLAGYL) IVPB 500 mg  Status:  Discontinued     500 mg 100 mL/hr over 60 Minutes Intravenous Every 8 hours 12/18/15 0427 12/20/15 0904   12/18/15 0345  piperacillin-tazobactam (ZOSYN) IVPB 3.375 g  Status:  Discontinued     3.375 g 100 mL/hr over 30 Minutes Intravenous  Once 12/18/15 0330 12/18/15 0427      Medications: Scheduled Meds: . sodium chloride   Intravenous Once  . DULoxetine  60 mg Oral QHS  . enoxaparin (LOVENOX) injection  40 mg Subcutaneous Q24H  . lactose free nutrition  237 mL Oral TID WC  . methadone  5 mg Oral Q8H  . pantoprazole  40 mg Oral Daily  . piperacillin-tazobactam (ZOSYN)  IV  3.375 g Intravenous Q8H  . sodium chloride flush  3 mL Intravenous Q12H  . timolol  1 drop Both Eyes QHS  .  timolol  2 drop Both Eyes Daily   Continuous Infusions:  PRN Meds:.sodium chloride, acetaminophen **OR** acetaminophen, HYDROmorphone (DILAUDID) injection, methocarbamol, ondansetron (ZOFRAN) IV, oxyCODONE, sodium chloride flush, sodium chloride flush, sodium chloride flush, temazepam  Assessment/Plan: Patient Active Problem List   Diagnosis Date Noted  . Difficulty in walking, not elsewhere classified   . Disorder of sleep wake schedule   . Protein-calorie malnutrition, severe ([place])   . End of life care   . Goals of care, counseling/discussion   . Palliative care by specialist   . Chronic bilateral low back pain without sciatica   . Small bowel obstruction 12/18/2015  .  Diverticulitis of large intestine without perforation or abscess without bleeding 12/18/2015  . SBO (small bowel obstruction) 12/18/2015  . Left ventricular dysfunction  reported in chart, not confirmed on review of echo 11/28/2014  . Pulmonary arterial hypertension 11/28/2014  . Perforated chronic gastric ulcer ([place]) 08/11/2014  . Encounter for intubation   . Chronic pain syndrome   . Hyponatremia 07/17/2014  . DNR (do not resuscitate) 07/07/2013  . Palliative care encounter 07/07/2013  . Narcotic overdose 07/03/2013   s/p Procedure(s): EXPLORATORY LAPAROTOMY/ REMOVAL OF MESH 12/21/2015 -continue diet -continue vac -ambulate with assist -reviewed home meds, appears to be on all appropriate meds for her chronic pain issues as well as acute surgical pain issues   LOS: 14 days   Mickeal Skinner, MD Pg# [phone] [facility]

## 2016-01-01 NOTE — Progress Notes (Signed)
PROGRESS NOTE  Monique Russo [id number] DOB: 06/13/1930 DOA: 12/17/2015 PCP: Horatio Pel, MD   LOS: 14 days   Brief Narrative: Monique Russo a 82 y.o.femalewith a past medical history significant for chronic pain on methadone and chronic non-healing surgical woundwho presented with abdominal pain and vomiting for 3 days. The patient had a perforated ulcer 1 year ago requiring [name] patch.This surgery was unfortunately complicated by nonhealing surgical wound (because of 80 year old infected hernia repair mesh?), Which for the last year the patient has been having wound care by her general surgeon in the office every six weeks (per family). This week, two days prior to admission, the patient had an office debridement and described that Dr. Excell Seltzer did his usual debridement, after which she began to develop nausea and LLQ abdominal pain. This crampy, severe, LLQ aching abdominal pain continued intermittently and was associated with several episodes of nonbloody emesis and obstipation.   On the morning of admission, she was recommended by surgery office for ER evaluation. CT of the abdomen and pelvis was obtained that showed small bowel obstruction with transition point in the right mid abdomenand stranding around the colon, consistent with diverticulitis. Surgery was consulted and patient was admitted for further workup. Patient's condition did not improve and she underwent ExLap, LOA, removal of prior mesh and complex abdominal wall closure and hernia repair with biologic mesh on 10/3. Postoperative course was initially complicated by mild ileus, and patient needed to be started on TPN, however she is improving now, and her diet is advanced. On 10/9, patient started developing significant watery diarrhea requiring flexi-seal. C diff is checked and returned negative.   Assessment & Plan: Principal Problem:   Small bowel obstruction Active Problems:   Hyponatremia  Chronic pain syndrome   Diverticulitis of large intestine without perforation or abscess without bleeding   SBO (small bowel obstruction)   Chronic bilateral low back pain without sciatica   Difficulty in walking, not elsewhere classified   Disorder of sleep wake schedule   Protein-calorie malnutrition, severe (HCC)   End of life care   Goals of care, counseling/discussion   Palliative care by specialist   Small bowel obstruction Last documented bowel movement 10/14, however abdominal x-ray 10/3 showed persistent high-grade small bowel obstruction, complicated surgical history, very poor nutritional status - general surgery, following, patient underwent ExLap, LOA, removal of prior mesh and complex abdominal wall closure and hernia repair with biologic mesh on 10/3. On TPN, boost, being managed by surgery  patient is not stable for discharge, currently also on TPN  RLQ drain 60cc/24hr - Appreciate WOC recommendations: Mepitel to protect mesh, hydrogel and gauze to promote moist healing. Surgical team to make further recommendations regarding wound care, expected for wound healing given extremely poor nutritional status  Imodium discontinued, patient still not eating or drinking according to the daughter who is by the bedside At this point she does not want to pursue palliative care but is still a DO NOT RESUSCITATE Continues to eat poorly, continue calorie count, likes boost    Diarrhea - Patient developed significant amounts of watery diarrhea on 10/9, requiring a Flexi Seal placement, C. difficile  negative    Hypotension multifactorial, could be secondary to poor oral intake, underlying infection, minimize narcotics Minimize Ativan, , status post normal saline bolus, blood pressure improved after transfusion  Diverticulitis - There is stranding around the colon that suggests diverticulitis. - was on Ciprofloxacin and Flagyl IV for 8 days. Now on Zosyn, continue Zosyn  for now due  to slow improvement in her white count   Aspiration PNA vs HCAP,  - SLP evaluation ordered, discussed at length with daughter, she understands aspiration risk - CXR with developing PNA, last chest x-ray was done on 10/7   continue Zosyn which will have good intra-abdominal coverage as well as HCAP White count improving  Somnolence-multiple medications other than Zyprexa caused patient to be sedated Dc trazodone, minimize narcotics, doses adjusted   Hyponatremia - Improving    Chronic pain syndrome Continue home dose of methadone Medications that can help with opioid withdrawal symptoms during a taper include clonidine, hydroxyzine, Tylenol or ibuprofen, hyocosamine, ondansetron or Phenergan, and fluids.  - pain improved now. Minimize narcotics like oxycodone and Dilaudid, patient already on scheduled methadone - QTC 450, continue methadone at home dose without tapering   GERD and hiatal hernia - Replace PPI with IV famotidine while NPO  - DG barium esophagus showed marked lower esophageal tortuosity with large hiatal hernia but no mass. For definitive imaging, patient will need EGD but this can be done at a later date. This may contribute to her aspiration / reflux, discussed with daughter at bedside 10/6 That she will need close outpatient gastroenterology follow-up once her acute illness is resolved  Acute hypoxic respiratory failure - post op requiring 4L [place], unable to wean oxygen - CXR without overt pulmonary edema 10/4 however developing infiltrate 10/7. Abx as above - closely monitor respiratory status  Anemia of chronic disease  Hg dropped from 13.9>7.2, Corrected appropriately after 2 units, hemoglobin now 9.1   DVT prophylaxis: SCD Code Status: Full Family Communication: d/w daughter at bedside Disposition Plan: remain inpatient, DO NOT RESUSCITATE, continue current measures   Consultants:   General surgery  Palliative care  Procedures:   ExLap, LOA,  removal of prior mesh and complex abdominal wall closure and hernia repair with biologic mesh on 10/3.  Foley: yes  Antimicrobials:  Ciprofloxacin 9/30 >> 10/7  Flagyl 9/30 >> 10/7  Zosyn 10/7 >>  Subjective:   awake, interactive,  Son in the room ,   Objective: Vitals:   12/31/15 1815 12/31/15 2059 12/31/15 2145 01/01/16 0551  BP: (!) 101/55 (!) 91/52 (!) 91/52 (!) 150/60  Pulse: 74 86 91 83  Resp: 16 18 18    Temp: 99.1 F (37.3 C) 98.9 F (37.2 C) 97.8 F (36.6 C) 98.3 F (36.8 C)  TempSrc: Oral Oral Oral Oral  SpO2: 100% 99% 96% 94%  Weight:      Height:        Intake/Output Summary (Last 24 hours) at 01/01/16 0937 Last data filed at 01/01/16 0823  Gross per 24 hour  Intake          1733.33 ml  Output             1762 ml  Net           -28.67 ml   Filed Weights   12/17/15 2249 12/18/15 1238 12/31/15 0905  Weight: 73 kg (161 lb) 72.6 kg (160 lb) 89.6 kg (197 lb 8.5 oz)    Examination: Constitutional: in pain,appears distressed Vitals:   12/31/15 1815 12/31/15 2059 12/31/15 2145 01/01/16 0551  BP: (!) 101/55 (!) 91/52 (!) 91/52 (!) 150/60  Pulse: 74 86 91 83  Resp: 16 18 18    Temp: 99.1 F (37.3 C) 98.9 F (37.2 C) 97.8 F (36.6 C) 98.3 F (36.8 C)  TempSrc: Oral Oral Oral Oral  SpO2: 100% 99% 96% 94%  Weight:      Height:       Eyes: PERRL Neck: normal, supple Respiratory: no wheezing, gurgling sounds Cardiovascular: Regular rate and rhythm, no murmurs / rubs / gallops. No LE edema. Abdomen: dressing in place, C/D/I Musculoskeletal: no clubbing / cyanosis. Neurologic: non focal    Data Reviewed: I have personally reviewed following labs and imaging studies  CBC:  Recent Labs Lab 12/27/15 0313 12/28/15 1030 12/29/15 0430 12/30/15 0415 12/31/15 0420 01/01/16 0530  WBC 19.7* 19.4* 16.2* 14.4* 13.4* 11.2*  NEUTROABS 15.1*  --   --   --   --   --   HGB 8.1* 7.9* 7.6* 7.5* 7.2* 9.1*  HCT 25.2* 23.8* 23.1* 21.9* 21.7* 27.3*  MCV 92.6  90.8 90.9 92.0 91.2 91.0  PLT 381 385 381 430* 393 [id number]*   Basic Metabolic Panel:  Recent Labs Lab 12/26/15 1100 12/27/15 0313 12/28/15 0544 12/29/15 0430 12/30/15 0415 12/31/15 0420 01/01/16 0530  NA 131* 132* 130* 131* 130* 132* 133*  K 3.8 4.3 4.7 4.8 4.3 4.7 4.1  CL 102 102 99* 99* 98* 100* 101  CO2 23 25 24 26 27 26 27   GLUCOSE 139* 113* 123* 108* 112* 97 102*  BUN 9 12 12 11 11 9 9   CREATININE 0.54 0.57 0.56 0.60 0.67 0.69 0.62  CALCIUM 8.3* 8.2* 8.3* 8.4* 8.6* 8.7* 8.8*  MG 1.8 2.1 1.9  --  1.8  --  1.8  PHOS 2.6 3.3 3.8  --  3.8  --  3.4   GFR: Estimated Creatinine Clearance: 53.1 mL/min (by C-G formula based on SCr of 0.62 mg/dL). Liver Function Tests:  Recent Labs Lab 12/27/15 0313 12/29/15 0430 12/30/15 0415 12/31/15 0420 01/01/16 0530  AST 14* 14* 13* 16 14*  ALT 7* 8* 8* 8* 10*  ALKPHOS 62 55 58 94 94  BILITOT 0.2* 0.2* 0.2* 0.3 0.5  PROT 4.6* 4.9* 4.9* 4.9* 5.1*  ALBUMIN 1.5* 1.3* 1.5* 1.4* 1.5*   CBG:  Recent Labs Lab 12/27/15 1747 12/28/15 0629 12/28/15 1151 12/30/15 0733 12/30/15 1237  GLUCAP 113* 127* 143* 103* 133*   Lipid Profile:  Recent Labs  01/01/16 0530  TRIG 95   Thyroid Function Tests: No results for input(s): TSH, T4TOTAL, FREET4, T3FREE, THYROIDAB in the last 72 hours. Anemia Panel: No results for input(s): VITAMINB12, FOLATE, FERRITIN, TIBC, IRON, RETICCTPCT in the last 72 hours. Urine analysis:    Component Value Date/Time   COLORURINE AMBER (A) 12/18/2015 1507   APPEARANCEUR CLEAR 12/18/2015 1507   LABSPEC >1.046 (H) 12/18/2015 1507   PHURINE 5.5 12/18/2015 1507   GLUCOSEU NEGATIVE 12/18/2015 1507   HGBUR NEGATIVE 12/18/2015 1507   BILIRUBINUR SMALL (A) 12/18/2015 1507   KETONESUR NEGATIVE 12/18/2015 1507   PROTEINUR NEGATIVE 12/18/2015 1507   UROBILINOGEN 0.2 08/04/2014 2100   NITRITE NEGATIVE 12/18/2015 1507   LEUKOCYTESUR NEGATIVE 12/18/2015 1507   Sepsis Labs: Invalid input(s): PROCALCITONIN,  LACTICIDVEN  Recent Results (from the past 240 hour(s))  C difficile quick scan w PCR reflex     Status: None   Collection Time: 12/27/15  1:52 PM  Result Value Ref Range Status   C Diff antigen NEGATIVE NEGATIVE Final   C Diff toxin NEGATIVE NEGATIVE Final   C Diff interpretation Negative for C. difficile  Final      Radiology Studies: No results found.   Scheduled Meds: . sodium chloride   Intravenous Once  . DULoxetine  60 mg Oral QHS  . enoxaparin (LOVENOX) injection  40 mg Subcutaneous Q24H  . lactose free nutrition  237 mL Oral TID WC  . methadone  5 mg Oral Q8H  . pantoprazole  40 mg Oral Daily  . piperacillin-tazobactam (ZOSYN)  IV  3.375 g Intravenous Q8H  . sodium chloride flush  3 mL Intravenous Q12H  . timolol  1 drop Both Eyes QHS  . timolol  2 drop Both Eyes Daily   Continuous Infusions:    Marzetta Board, MD, PhD [facility] Pager [phone]  If 7PM-7AM, please contact night-coverage [email or web address] Password [email or web address] 01/01/2016, 9:37 AM

## 2016-01-02 LAB — CBC
HCT: 27.9 % — ABNORMAL LOW (ref 36.0–46.0)
Hemoglobin: 9.1 g/dL — ABNORMAL LOW (ref 12.0–15.0)
MCH: 29.9 pg (ref 26.0–34.0)
MCHC: 32.6 g/dL (ref 30.0–36.0)
MCV: 91.8 fL (ref 78.0–100.0)
PLATELETS: 451 10*3/uL — AB (ref 150–400)
RBC: 3.04 MIL/uL — ABNORMAL LOW (ref 3.87–5.11)
RDW: 14.5 % (ref 11.5–15.5)
WBC: 8.6 10*3/uL (ref 4.0–10.5)

## 2016-01-02 NOTE — Progress Notes (Signed)
12 Days Post-Op  Subjective: Pt with min appetite   Objective: Vital signs in last 24 hours: Temp:  [97.9 F (36.6 C)-98.6 F (37 C)] 98.6 F (37 C) (10/15 0558) Pulse Rate:  [72-99] 72 (10/15 0558) Resp:  [16-18] 18 (10/15 0558) BP: (91-166)/(57-70) 99/57 (10/15 0558) SpO2:  [93 %-98 %] 98 % (10/15 0558) Last BM Date: 12/31/15  Intake/Output from previous day: 10/14 0701 - 10/15 0700 In: 650 [P.O.:600; IV Piggyback:50] Out: 2843 [Urine:2801; Drains:40; Stool:2] Intake/Output this shift: No intake/output data recorded.  General appearance: alert and cooperative GI: soft, non-tender; bowel sounds normal; no masses,  no organomegaly and Vac in place  Lab Results:   Recent Labs  01/01/16 0530 01/02/16 0445  WBC 11.2* 8.6  HGB 9.1* 9.1*  HCT 27.3* 27.9*  PLT 404* 451*   BMET  Recent Labs  12/31/15 0420 01/01/16 0530  NA 132* 133*  K 4.7 4.1  CL 100* 101  CO2 26 27  GLUCOSE 97 102*  BUN 9 9  CREATININE 0.69 0.62  CALCIUM 8.7* 8.8*   PT/INR No results for input(s): LABPROT, INR in the last 72 hours. ABG No results for input(s): PHART, HCO3 in the last 72 hours.  Invalid input(s): PCO2, PO2  Studies/Results: No results found.  Anti-infectives: Anti-infectives    Start     Dose/Rate Route Frequency Ordered Stop   12/30/15 1500  piperacillin-tazobactam (ZOSYN) IVPB 3.375 g     3.375 g 12.5 mL/hr over 240 Minutes Intravenous Every 8 hours 12/30/15 1405     12/29/15 1300  piperacillin-tazobactam (ZOSYN) IVPB 3.375 g  Status:  Discontinued     3.375 g 12.5 mL/hr over 240 Minutes Intravenous Every 8 hours 12/29/15 1016 12/30/15 1032   12/25/15 1300  piperacillin-tazobactam (ZOSYN) IVPB 3.375 g  Status:  Discontinued     3.375 g 12.5 mL/hr over 240 Minutes Intravenous Every 8 hours 12/25/15 1218 12/29/15 1016   12/20/15 1000  ciprofloxacin (CIPRO) IVPB 400 mg  Status:  Discontinued     400 mg 200 mL/hr over 60 Minutes Intravenous 2 times daily 12/20/15  0907 12/25/15 1151   12/20/15 0945  metroNIDAZOLE (FLAGYL) IVPB 500 mg  Status:  Discontinued     500 mg 100 mL/hr over 60 Minutes Intravenous Every 8 hours 12/20/15 0907 12/25/15 1151   12/18/15 0430  ciprofloxacin (CIPRO) IVPB 400 mg  Status:  Discontinued     400 mg 200 mL/hr over 60 Minutes Intravenous 2 times daily 12/18/15 0427 12/20/15 0904   12/18/15 0430  metroNIDAZOLE (FLAGYL) IVPB 500 mg  Status:  Discontinued     500 mg 100 mL/hr over 60 Minutes Intravenous Every 8 hours 12/18/15 0427 12/20/15 0904   12/18/15 0345  piperacillin-tazobactam (ZOSYN) IVPB 3.375 g  Status:  Discontinued     3.375 g 100 mL/hr over 30 Minutes Intravenous  Once 12/18/15 0330 12/18/15 0427      Assessment/Plan: s/p Procedure(s): EXPLORATORY LAPAROTOMY/ REMOVAL OF MESH (N/A) 12/21/2015 -continue diet, encourage -continue vac -ambulate with assist -reviewed home meds, appears to be on all appropriate meds for her chronic pain issues as well as acute surgical pain issues   LOS: 15 days    Monique Russo., [facility] 01/02/2016

## 2016-01-02 NOTE — Progress Notes (Signed)
PROGRESS NOTE  Monique Russo [id number] DOB: 1930-10-07 DOA: 12/17/2015 PCP: Horatio Pel, MD   LOS: 15 days   Brief Narrative: [name] a 82 y.o.femalewith a past medical history significant for chronic pain on methadone and chronic non-healing surgical woundwho presented with abdominal pain and vomiting for 3 days. The patient had a perforated ulcer 1 year ago requiring [name] patch.This surgery was unfortunately complicated by nonhealing surgical wound (because of 80 year old infected hernia repair mesh?), Which for the last year the patient has been having wound care by her general surgeon in the office every six weeks (per family). This week, two days prior to admission, the patient had an office debridement and described that Dr. Excell Seltzer did his usual debridement, after which she began to develop nausea and LLQ abdominal pain. This crampy, severe, LLQ aching abdominal pain continued intermittently and was associated with several episodes of nonbloody emesis and obstipation.   On the morning of admission, she was recommended by surgery office for ER evaluation. CT of the abdomen and pelvis was obtained that showed small bowel obstruction with transition point in the right mid abdomenand stranding around the colon, consistent with diverticulitis. Surgery was consulted and patient was admitted for further workup. Patient's condition did not improve and she underwent ExLap, LOA, removal of prior mesh and complex abdominal wall closure and hernia repair with biologic mesh on 10/3. Postoperative course was initially complicated by mild ileus, and patient needed to be started on TPN, however she is improving now, and her diet is advanced. On 10/9, patient started developing significant watery diarrhea requiring flexi-seal. C diff is checked and returned negative.   Assessment & Plan: Principal Problem:   Small bowel obstruction Active Problems:   Hyponatremia  Chronic pain syndrome   Diverticulitis of large intestine without perforation or abscess without bleeding   SBO (small bowel obstruction)   Chronic bilateral low back pain without sciatica   Difficulty in walking, not elsewhere classified   Disorder of sleep wake schedule   Protein-calorie malnutrition, severe (HCC)   End of life care   Goals of care, counseling/discussion   Palliative care by specialist   Small bowel obstruction Last documented bowel movement 10/14, however abdominal x-ray 10/3 showed persistent high-grade small bowel obstruction, complicated surgical history, very poor nutritional status - general surgery, following, patient underwent ExLap, LOA, removal of prior mesh and complex abdominal wall closure and hernia repair with biologic mesh on 10/3. On TPN, boost, being managed by surgery  patient is not stable for discharge, currently also on TPN  RLQ drain 60cc/24hr - Appreciate WOC recommendations: Mepitel to protect mesh, hydrogel and gauze to promote moist healing. Surgical team to make further recommendations regarding wound care, expected for wound healing given extremely poor nutritional status  Imodium discontinued,  At this point she does not want to pursue palliative care but is still a DO NOT RESUSCITATE Eating better and progressing well ,    Diarrhea - Patient developed significant amounts of watery diarrhea on 10/9, requiring a Flexi Seal placement, C. difficile  Negative, remove flexiseal?    Hypotension multifactorial, could be secondary to poor oral intake, underlying infection, minimize narcotics Minimize Ativan,   blood pressure improved after transfusion  Diverticulitis - There is stranding around the colon that suggests diverticulitis. - was on Ciprofloxacin and Flagyl IV for 8 days. Now on Zosyn, continue Zosyn for now due to slow improvement in her white count,  DC antibiotics per surgery recommendations , do not  continue for  HCAP   Aspiration PNA vs HCAP,  - SLP evaluation ordered, discussed at length with daughter, she understands aspiration risk - CXR with developing PNA, last chest x-ray was done on 10/7   continue Zosyn if needed for surgery issues   Somnolence-multiple medications other than Zyprexa caused patient to be sedated Dc trazodone, minimize narcotics, doses adjusted   Hyponatremia - Improving    Chronic pain syndrome Continue home dose of methadone Medications that can help with opioid withdrawal symptoms during a taper include clonidine, hydroxyzine, Tylenol or ibuprofen, hyocosamine, ondansetron or Phenergan, and fluids.  - pain improved now. Minimize narcotics like oxycodone and Dilaudid, patient already on scheduled methadone - QTC 450, continue methadone at home dose without tapering   GERD and hiatal hernia - Replace PPI with IV famotidine while NPO  - DG barium esophagus showed marked lower esophageal tortuosity with large hiatal hernia but no mass. For definitive imaging, patient will need EGD but this can be done at a later date. This may contribute to her aspiration / reflux, discussed with daughter at bedside 10/6 That she will need close outpatient gastroenterology follow-up once her acute illness is resolved   Acute hypoxic respiratory failure - post op requiring 4L [place],   wean oxygen - CXR without overt pulmonary edema 10/4 however developing infiltrate 10/7. Abx as above - closely monitor respiratory status  Anemia of chronic disease  Hg dropped from 13.9>7.2, Corrected appropriately after 2 units, hemoglobin now 9.1   DVT prophylaxis: SCD Code Status: Full Family Communication: d/w daughter at bedside Disposition Plan: remain inpatient, DO NOT RESUSCITATE, DC next week   Consultants:   General surgery  Palliative care  Procedures:   ExLap, LOA, removal of prior mesh and complex abdominal wall closure and hernia repair with biologic mesh on 10/3.  Foley:  yes  Antimicrobials:  Ciprofloxacin 9/30 >> 10/7  Flagyl 9/30 >> 10/7  Zosyn 10/7 >>  Subjective:  Patient is awake Would like to ambulate  Objective: Vitals:   01/01/16 0551 01/01/16 1425 01/01/16 2144 01/02/16 0558  BP: (!) 150/60 91/66 (!) 166/70 (!) 99/57  Pulse: 83 76 99 72  Resp:  16 17 18   Temp: 98.3 F (36.8 C) 97.9 F (36.6 C) 98.2 F (36.8 C) 98.6 F (37 C)  TempSrc: Oral Oral Oral Oral  SpO2: 94% 93% 94% 98%  Weight:      Height:        Intake/Output Summary (Last 24 hours) at 01/02/16 1203 Last data filed at 01/02/16 0602  Gross per 24 hour  Intake              530 ml  Output             2841 ml  Net            -2311 ml   Filed Weights   12/17/15 2249 12/18/15 1238 12/31/15 0905  Weight: 73 kg (161 lb) 72.6 kg (160 lb) 89.6 kg (197 lb 8.5 oz)    Examination: Constitutional: in pain,appears distressed Vitals:   01/01/16 0551 01/01/16 1425 01/01/16 2144 01/02/16 0558  BP: (!) 150/60 91/66 (!) 166/70 (!) 99/57  Pulse: 83 76 99 72  Resp:  16 17 18   Temp: 98.3 F (36.8 C) 97.9 F (36.6 C) 98.2 F (36.8 C) 98.6 F (37 C)  TempSrc: Oral Oral Oral Oral  SpO2: 94% 93% 94% 98%  Weight:      Height:  Eyes: PERRL Neck: normal, supple Respiratory: no wheezing, gurgling sounds Cardiovascular: Regular rate and rhythm, no murmurs / rubs / gallops. No LE edema. Abdomen: dressing in place, C/D/I Musculoskeletal: no clubbing / cyanosis. Neurologic: non focal    Data Reviewed: I have personally reviewed following labs and imaging studies  CBC:  Recent Labs Lab 12/27/15 0313  12/29/15 0430 12/30/15 0415 12/31/15 0420 01/01/16 0530 01/02/16 0445  WBC 19.7*  < > 16.2* 14.4* 13.4* 11.2* 8.6  NEUTROABS 15.1*  --   --   --   --   --   --   HGB 8.1*  < > 7.6* 7.5* 7.2* 9.1* 9.1*  HCT 25.2*  < > 23.1* 21.9* 21.7* 27.3* 27.9*  MCV 92.6  < > 90.9 92.0 91.2 91.0 91.8  PLT 381  < > 381 430* 393 404* 451*  < > = values in this interval not  displayed. Basic Metabolic Panel:  Recent Labs Lab 12/27/15 0313 12/28/15 0544 12/29/15 0430 12/30/15 0415 12/31/15 0420 01/01/16 0530  NA 132* 130* 131* 130* 132* 133*  K 4.3 4.7 4.8 4.3 4.7 4.1  CL 102 99* 99* 98* 100* 101  CO2 25 24 26 27 26 27   GLUCOSE 113* 123* 108* 112* 97 102*  BUN 12 12 11 11 9 9   CREATININE 0.57 0.56 0.60 0.67 0.69 0.62  CALCIUM 8.2* 8.3* 8.4* 8.6* 8.7* 8.8*  MG 2.1 1.9  --  1.8  --  1.8  PHOS 3.3 3.8  --  3.8  --  3.4   GFR: Estimated Creatinine Clearance: 53.1 mL/min (by C-G formula based on SCr of 0.62 mg/dL). Liver Function Tests:  Recent Labs Lab 12/27/15 0313 12/29/15 0430 12/30/15 0415 12/31/15 0420 01/01/16 0530  AST 14* 14* 13* 16 14*  ALT 7* 8* 8* 8* 10*  ALKPHOS 62 55 58 94 94  BILITOT 0.2* 0.2* 0.2* 0.3 0.5  PROT 4.6* 4.9* 4.9* 4.9* 5.1*  ALBUMIN 1.5* 1.3* 1.5* 1.4* 1.5*   CBG:  Recent Labs Lab 12/27/15 1747 12/28/15 0629 12/28/15 1151 12/30/15 0733 12/30/15 1237  GLUCAP 113* 127* 143* 103* 133*   Lipid Profile:  Recent Labs  01/01/16 0530  TRIG 95   Thyroid Function Tests: No results for input(s): TSH, T4TOTAL, FREET4, T3FREE, THYROIDAB in the last 72 hours. Anemia Panel: No results for input(s): VITAMINB12, FOLATE, FERRITIN, TIBC, IRON, RETICCTPCT in the last 72 hours. Urine analysis:    Component Value Date/Time   COLORURINE AMBER (A) 12/18/2015 1507   APPEARANCEUR CLEAR 12/18/2015 1507   LABSPEC >1.046 (H) 12/18/2015 1507   PHURINE 5.5 12/18/2015 1507   GLUCOSEU NEGATIVE 12/18/2015 1507   HGBUR NEGATIVE 12/18/2015 1507   BILIRUBINUR SMALL (A) 12/18/2015 1507   KETONESUR NEGATIVE 12/18/2015 1507   PROTEINUR NEGATIVE 12/18/2015 1507   UROBILINOGEN 0.2 08/04/2014 2100   NITRITE NEGATIVE 12/18/2015 1507   LEUKOCYTESUR NEGATIVE 12/18/2015 1507   Sepsis Labs: Invalid input(s): PROCALCITONIN, LACTICIDVEN  Recent Results (from the past 240 hour(s))  C difficile quick scan w PCR reflex     Status: None    Collection Time: 12/27/15  1:52 PM  Result Value Ref Range Status   C Diff antigen NEGATIVE NEGATIVE Final   C Diff toxin NEGATIVE NEGATIVE Final   C Diff interpretation Negative for C. difficile  Final  Culture, blood (x 2)     Status: None (Preliminary result)   Collection Time: 12/31/15  9:50 AM  Result Value Ref Range Status   Specimen Description BLOOD  LEFT HAND  Final   Special Requests BOTTLES DRAWN AEROBIC AND ANAEROBIC 5CC  Final   Culture NO GROWTH 1 DAY  Final   Report Status PENDING  Incomplete  Culture, blood (x 2)     Status: None (Preliminary result)   Collection Time: 12/31/15  9:57 AM  Result Value Ref Range Status   Specimen Description BLOOD LEFT ARM  Final   Special Requests BOTTLES DRAWN AEROBIC ONLY 5CC  Final   Culture NO GROWTH 1 DAY  Final   Report Status PENDING  Incomplete      Radiology Studies: No results found.   Scheduled Meds: . sodium chloride   Intravenous Once  . DULoxetine  60 mg Oral QHS  . enoxaparin (LOVENOX) injection  40 mg Subcutaneous Q24H  . lactose free nutrition  237 mL Oral TID WC  . methadone  5 mg Oral Q8H  . pantoprazole  40 mg Oral Daily  . piperacillin-tazobactam (ZOSYN)  IV  3.375 g Intravenous Q8H  . sodium chloride flush  3 mL Intravenous Q12H  . timolol  1 drop Both Eyes QHS  . timolol  2 drop Both Eyes Daily   Continuous Infusions:    Marzetta Board, MD, PhD [facility] Pager [phone]  If 7PM-7AM, please contact night-coverage [email or web address] Password [email or web address] 01/02/2016, 12:03 PM

## 2016-01-03 LAB — TRIGLYCERIDES: TRIGLYCERIDES: 131 mg/dL (ref <150)

## 2016-01-03 MED ORDER — DIPHENHYDRAMINE HCL 25 MG PO CAPS
25.0000 mg | ORAL_CAPSULE | ORAL | Status: DC | PRN
  Administered 2016-01-03: 25 mg via ORAL
  Filled 2016-01-03: qty 1

## 2016-01-03 MED ORDER — DIPHENHYDRAMINE-ZINC ACETATE 2-0.1 % EX CREA
TOPICAL_CREAM | Freq: Three times a day (TID) | CUTANEOUS | Status: DC | PRN
  Administered 2016-01-03: 1 via TOPICAL
  Filled 2016-01-03: qty 28

## 2016-01-03 MED ORDER — ALUM & MAG HYDROXIDE-SIMETH 200-200-20 MG/5ML PO SUSP
15.0000 mL | ORAL | Status: DC | PRN
  Administered 2016-01-03: 15 mL via ORAL
  Filled 2016-01-03: qty 30

## 2016-01-03 NOTE — Progress Notes (Signed)
PROGRESS NOTE  Monique Russo [id number] DOB: 09-27-1930 DOA: 12/17/2015 PCP: Horatio Pel, MD   LOS: 16 days   Brief Narrative: Monique Russo a 82 y.o.femalewith a past medical history significant for chronic pain on methadone and chronic non-healing surgical woundwho presented with abdominal pain and vomiting for 3 days. The patient had a perforated ulcer 1 year ago requiring [name] patch.This surgery was unfortunately complicated by nonhealing surgical wound (because of 80 year old infected hernia repair mesh?), Which for the last year the patient has been having wound care by her general surgeon in the office every six weeks (per family). This week, two days prior to admission, the patient had an office debridement and described that Dr. Excell Seltzer did his usual debridement, after which she began to develop nausea and LLQ abdominal pain. This crampy, severe, LLQ aching abdominal pain continued intermittently and was associated with several episodes of nonbloody emesis and obstipation.   On the morning of admission, she was recommended by surgery office for ER evaluation. CT of the abdomen and pelvis was obtained that showed small bowel obstruction with transition point in the right mid abdomenand stranding around the colon, consistent with diverticulitis. Surgery was consulted and patient was admitted for further workup. Patient's condition did not improve and she underwent ExLap, LOA, removal of prior mesh and complex abdominal wall closure and hernia repair with biologic mesh on 10/3. Postoperative course was initially complicated by mild ileus, and patient needed to be started on TPN, however she is improving now, and her diet is advanced. On 10/9, patient started developing significant watery diarrhea requiring flexi-seal. C diff is checked and returned negative.   Assessment & Plan: Principal Problem:   Small bowel obstruction Active Problems:   Hyponatremia  Chronic pain syndrome   Diverticulitis of large intestine without perforation or abscess without bleeding   SBO (small bowel obstruction)   Chronic bilateral low back pain without sciatica   Difficulty in walking, not elsewhere classified   Disorder of sleep wake schedule   Protein-calorie malnutrition, severe (HCC)   End of life care   Goals of care, counseling/discussion   Palliative care by specialist  Hypotension multifactorial, blood pressure has trended down again could be secondary to poor oral intake, underlying infection, minimize narcotics Minimize Ativan,   blood pressure improved after transfusion, but trending down again Does not appear to be septic   Small bowel obstruction  abdominal x-ray 10/3 showed persistent high-grade small bowel obstruction, complicated surgical history, very poor nutritional status. However patient is having daily bowel movements - general surgery, following, patient underwent ExLap, LOA, removal of prior mesh and complex abdominal wall closure and hernia repair with biologic mesh on 10/3.[place] team will provide Vac changes on M-W-F. Will probably discharge to SNF with wound VAC Off TPN Surgical team to make further recommendations regarding wound care/antibiotics, expected for wound healing given extremely poor nutritional status. Also discuss  prognosis with the daughter from a surgical standpoint. Discussed with APP on 10/16  foley dc'd    Diarrhea-resolved - Patient developed significant amounts of watery diarrhea on 10/9, requiring a Flexi Seal placement, C. difficile  Negative, we had not removed flexiseal       Diverticulitis - There is stranding around the colon that suggests diverticulitis. - was on Ciprofloxacin and Flagyl IV for 8 days. Now on Zosyn, continue Zosyn for now pending decision from a surgical standpoint DC antibiotics per surgery recommendations , do not continue for HCAP   Aspiration PNA vs  HCAP,  - SLP evaluation  ordered, discussed at length with daughter, she understands aspiration risk - CXR with developing PNA, last chest x-ray was done on 10/7      Somnolence-multiple medications other than Zyprexa caused patient to be sedated Dc trazodone, minimize narcotics, doses adjusted   Hyponatremia - Improving    Chronic pain syndrome Continue home dose of methadone Medications that can help with opioid withdrawal symptoms during a taper include clonidine, hydroxyzine, Tylenol or ibuprofen, hyocosamine, ondansetron or Phenergan, and fluids.  - pain improved now. Minimize narcotics like oxycodone and Dilaudid, patient already on scheduled methadone - QTC 450, continue methadone at home dose without tapering   GERD and hiatal hernia Continue PPI - DG barium esophagus showed marked lower esophageal tortuosity with large hiatal hernia but no mass. For definitive imaging, patient will need EGD but this can be done at a later date.  That she will need close outpatient gastroenterology follow-up once her acute illness is resolved   Acute hypoxic respiratory failure - post op requiring 4L [place],   wean oxygen - CXR without overt pulmonary edema 10/4 however developing infiltrate 10/7. Abx as above - closely monitor respiratory status Try to ambulate the patient  Anemia of chronic disease  Hg dropped from 13.9>7.2, Corrected appropriately after 2 units, hemoglobin now 9.1   DVT prophylaxis: SCD Code Status: Full Family Communication: d/w daughter at bedside Disposition Plan: remain inpatient, DO NOT RESUSCITATE, DC in the next 2-3 days   Consultants:   General surgery  Palliative care  Procedures:   ExLap, LOA, removal of prior mesh and complex abdominal wall closure and hernia repair with biologic mesh on 10/3.  Foley: yes  Antimicrobials:  Ciprofloxacin 9/30 >> 10/7  Flagyl 9/30 >> 10/7  Zosyn 10/7 >>  Subjective: Depressed. Still not eating very much, but states that she ate  1/2 a cheese sandwich yesterday   Objective: Vitals:   01/01/16 2144 01/02/16 0558 01/02/16 1333 01/02/16 2103  BP: (!) 166/70 (!) 99/57 (!) 105/45 (!) 86/43  Pulse: 99 72 83 89  Resp: 17 18  18   Temp: 98.2 F (36.8 C) 98.6 F (37 C) 97.8 F (36.6 C) 98.7 F (37.1 C)  TempSrc: Oral Oral Oral Oral  SpO2: 94% 98% 100% 98%  Weight:      Height:        Intake/Output Summary (Last 24 hours) at 01/03/16 1037 Last data filed at 01/03/16 0600  Gross per 24 hour  Intake              653 ml  Output             2240 ml  Net            -1587 ml   Filed Weights   12/17/15 2249 12/18/15 1238 12/31/15 0905  Weight: 73 kg (161 lb) 72.6 kg (160 lb) 89.6 kg (197 lb 8.5 oz)    Examination: Constitutional: in pain,appears distressed Vitals:   01/01/16 2144 01/02/16 0558 01/02/16 1333 01/02/16 2103  BP: (!) 166/70 (!) 99/57 (!) 105/45 (!) 86/43  Pulse: 99 72 83 89  Resp: 17 18  18   Temp: 98.2 F (36.8 C) 98.6 F (37 C) 97.8 F (36.6 C) 98.7 F (37.1 C)  TempSrc: Oral Oral Oral Oral  SpO2: 94% 98% 100% 98%  Weight:      Height:       Eyes: PERRL Neck: normal, supple Respiratory: no wheezing, gurgling sounds Cardiovascular: Regular rate and  rhythm, no murmurs / rubs / gallops. No LE edema. Abdomen: dressing in place, C/D/I Musculoskeletal: no clubbing / cyanosis. Neurologic: non focal    Data Reviewed: I have personally reviewed following labs and imaging studies  CBC:  Recent Labs Lab 12/29/15 0430 12/30/15 0415 12/31/15 0420 01/01/16 0530 01/02/16 0445  WBC 16.2* 14.4* 13.4* 11.2* 8.6  HGB 7.6* 7.5* 7.2* 9.1* 9.1*  HCT 23.1* 21.9* 21.7* 27.3* 27.9*  MCV 90.9 92.0 91.2 91.0 91.8  PLT 381 430* 393 404* [id number]*   Basic Metabolic Panel:  Recent Labs Lab 12/28/15 0544 12/29/15 0430 12/30/15 0415 12/31/15 0420 01/01/16 0530  NA 130* 131* 130* 132* 133*  K 4.7 4.8 4.3 4.7 4.1  CL 99* 99* 98* 100* 101  CO2 24 26 27 26 27   GLUCOSE 123* 108* 112* 97 102*  BUN 12  11 11 9 9   CREATININE 0.56 0.60 0.67 0.69 0.62  CALCIUM 8.3* 8.4* 8.6* 8.7* 8.8*  MG 1.9  --  1.8  --  1.8  PHOS 3.8  --  3.8  --  3.4   GFR: Estimated Creatinine Clearance: 53.1 mL/min (by C-G formula based on SCr of 0.62 mg/dL). Liver Function Tests:  Recent Labs Lab 12/29/15 0430 12/30/15 0415 12/31/15 0420 01/01/16 0530  AST 14* 13* 16 14*  ALT 8* 8* 8* 10*  ALKPHOS 55 58 94 94  BILITOT 0.2* 0.2* 0.3 0.5  PROT 4.9* 4.9* 4.9* 5.1*  ALBUMIN 1.3* 1.5* 1.4* 1.5*   CBG:  Recent Labs Lab 12/27/15 1747 12/28/15 0629 12/28/15 1151 12/30/15 0733 12/30/15 1237  GLUCAP 113* 127* 143* 103* 133*   Lipid Profile:  Recent Labs  01/01/16 0530 01/03/16 0500  TRIG 95 131   Thyroid Function Tests: No results for input(s): TSH, T4TOTAL, FREET4, T3FREE, THYROIDAB in the last 72 hours. Anemia Panel: No results for input(s): VITAMINB12, FOLATE, FERRITIN, TIBC, IRON, RETICCTPCT in the last 72 hours. Urine analysis:    Component Value Date/Time   COLORURINE AMBER (A) 12/18/2015 1507   APPEARANCEUR CLEAR 12/18/2015 1507   LABSPEC >1.046 (H) 12/18/2015 1507   PHURINE 5.5 12/18/2015 1507   GLUCOSEU NEGATIVE 12/18/2015 1507   HGBUR NEGATIVE 12/18/2015 1507   BILIRUBINUR SMALL (A) 12/18/2015 1507   KETONESUR NEGATIVE 12/18/2015 1507   PROTEINUR NEGATIVE 12/18/2015 1507   UROBILINOGEN 0.2 08/04/2014 2100   NITRITE NEGATIVE 12/18/2015 1507   LEUKOCYTESUR NEGATIVE 12/18/2015 1507   Sepsis Labs: Invalid input(s): PROCALCITONIN, LACTICIDVEN  Recent Results (from the past 240 hour(s))  C difficile quick scan w PCR reflex     Status: None   Collection Time: 12/27/15  1:52 PM  Result Value Ref Range Status   C Diff antigen NEGATIVE NEGATIVE Final   C Diff toxin NEGATIVE NEGATIVE Final   C Diff interpretation Negative for C. difficile  Final  Culture, blood (x 2)     Status: None (Preliminary result)   Collection Time: 12/31/15  9:50 AM  Result Value Ref Range Status    Specimen Description BLOOD LEFT HAND  Final   Special Requests BOTTLES DRAWN AEROBIC AND ANAEROBIC 5CC  Final   Culture NO GROWTH 2 DAYS  Final   Report Status PENDING  Incomplete  Culture, blood (x 2)     Status: None (Preliminary result)   Collection Time: 12/31/15  9:57 AM  Result Value Ref Range Status   Specimen Description BLOOD LEFT ARM  Final   Special Requests BOTTLES DRAWN AEROBIC ONLY 5CC  Final   Culture NO  GROWTH 2 DAYS  Final   Report Status PENDING  Incomplete      Radiology Studies: No results found.   Scheduled Meds: . sodium chloride   Intravenous Once  . DULoxetine  60 mg Oral QHS  . enoxaparin (LOVENOX) injection  40 mg Subcutaneous Q24H  . lactose free nutrition  237 mL Oral TID WC  . methadone  5 mg Oral Q8H  . pantoprazole  40 mg Oral Daily  . piperacillin-tazobactam (ZOSYN)  IV  3.375 g Intravenous Q8H  . sodium chloride flush  3 mL Intravenous Q12H  . timolol  1 drop Both Eyes QHS  . timolol  2 drop Both Eyes Daily   Continuous Infusions:    Marzetta Board, MD, PhD [facility] Pager [phone]  If 7PM-7AM, please contact night-coverage [email or web address] Password [email or web address] 01/03/2016, 10:37 AM

## 2016-01-03 NOTE — Consult Note (Addendum)
WOC follow-up:  Reason for Consult: Vac dressing change to abd Wound type: Full thickness post-op wound to middle abd Wound bed: Appearance unchanged from previous assessment; exposed tan-colored mesh with staples above and below open wound Drainage (amount, consistency, odor) Mod amt reddish drainage in the cannister Periwound: Intact skin surrounding, staples well-approximated Dressing procedure/placement/frequency: Mepitel to protect mesh,1 piece of white foam, 1 piece of black foam, suction to 161mm/hg pt tolerated without c/o pain. [facility] team will provide Vac changes on M-W-F.  Husband at bedside to assess wound during dressing change. Julien Girt MSN, RN, [place], [place], [place]

## 2016-01-03 NOTE — Care Management Important Message (Signed)
Important Message  Patient Details  Name: Monique Russo MRN: [id number] Date of Birth: [birth year]   Medicare Important Message Given:  Yes    Nathen May 01/03/2016, 2:08 PM

## 2016-01-03 NOTE — Progress Notes (Signed)
Patient ID: Monique Russo, female   DOB: 05/08/1930, 82 y.o.   MRN: [id number]  [facility] Surgery Progress Note  13 Days Post-Op  Subjective: Depressed. Still not eating very much, but states that she ate 1/2 a cheese sandwich yesterday which is the most she has eaten since surgery; tolerating Boost as well.  Vac changed this morning >> per [facility] nurse wound is healing slowly but well, long term vac would be best method of healing for the mesh.  Objective: Vital signs in last 24 hours: Temp:  [97.8 F (36.6 C)-98.7 F (37.1 C)] 98.7 F (37.1 C) (10/15 2103) Pulse Rate:  [83-89] 89 (10/15 2103) Resp:  [18] 18 (10/15 2103) BP: (86-105)/(43-45) 86/43 (10/15 2103) SpO2:  [98 %-100 %] 98 % (10/15 2103) Last BM Date: 01/02/16  Intake/Output from previous day: 10/15 0701 - 10/16 0700 In: 653 [P.O.:360; I.V.:243; IV Piggyback:50] Out: 2240 [id number]; Drains:265] Intake/Output this shift: No intake/output data recorded.  PE: Gen:  Alert, NAD Abd: Soft, NT/ND, +BS, midline incision with staples intact and vac in place (just changed this morning), drain site cdi with minimal sanguinous drainage  Lab Results:   Recent Labs  01/01/16 0530 01/02/16 0445  WBC 11.2* 8.6  HGB 9.1* 9.1*  HCT 27.3* 27.9*  PLT 404* 451*   BMET  Recent Labs  01/01/16 0530  NA 133*  K 4.1  CL 101  CO2 27  GLUCOSE 102*  BUN 9  CREATININE 0.62  CALCIUM 8.8*   PT/INR No results for input(s): LABPROT, INR in the last 72 hours. CMP     Component Value Date/Time   NA 133 (L) 01/01/2016 0530   K 4.1 01/01/2016 0530   CL 101 01/01/2016 0530   CO2 27 01/01/2016 0530   GLUCOSE 102 (H) 01/01/2016 0530   BUN 9 01/01/2016 0530   CREATININE 0.62 01/01/2016 0530   CALCIUM 8.8 (L) 01/01/2016 0530   PROT 5.1 (L) 01/01/2016 0530   ALBUMIN 1.5 (L) 01/01/2016 0530   AST 14 (L) 01/01/2016 0530   ALT 10 (L) 01/01/2016 0530   ALKPHOS 94 01/01/2016 0530   BILITOT 0.5 01/01/2016 0530   GFRNONAA  >60 01/01/2016 0530   GFRAA >60 01/01/2016 0530   Lipase     Component Value Date/Time   LIPASE 12 (L) 08/06/2014 2020       Studies/Results: No results found.  Anti-infectives: Anti-infectives    Start     Dose/Rate Route Frequency Ordered Stop   12/30/15 1500  piperacillin-tazobactam (ZOSYN) IVPB 3.375 g     3.375 g 12.5 mL/hr over 240 Minutes Intravenous Every 8 hours 12/30/15 1405     12/29/15 1300  piperacillin-tazobactam (ZOSYN) IVPB 3.375 g  Status:  Discontinued     3.375 g 12.5 mL/hr over 240 Minutes Intravenous Every 8 hours 12/29/15 1016 12/30/15 1032   12/25/15 1300  piperacillin-tazobactam (ZOSYN) IVPB 3.375 g  Status:  Discontinued     3.375 g 12.5 mL/hr over 240 Minutes Intravenous Every 8 hours 12/25/15 1218 12/29/15 1016   12/20/15 1000  ciprofloxacin (CIPRO) IVPB 400 mg  Status:  Discontinued     400 mg 200 mL/hr over 60 Minutes Intravenous 2 times daily 12/20/15 0907 12/25/15 1151   12/20/15 0945  metroNIDAZOLE (FLAGYL) IVPB 500 mg  Status:  Discontinued     500 mg 100 mL/hr over 60 Minutes Intravenous Every 8 hours 12/20/15 0907 12/25/15 1151   12/18/15 0430  ciprofloxacin (CIPRO) IVPB 400 mg  Status:  Discontinued  400 mg 200 mL/hr over 60 Minutes Intravenous 2 times daily 12/18/15 0427 12/20/15 0904   12/18/15 0430  metroNIDAZOLE (FLAGYL) IVPB 500 mg  Status:  Discontinued     500 mg 100 mL/hr over 60 Minutes Intravenous Every 8 hours 12/18/15 0427 12/20/15 0904   12/18/15 0345  piperacillin-tazobactam (ZOSYN) IVPB 3.375 g  Status:  Discontinued     3.375 g 100 mL/hr over 30 Minutes Intravenous  Once 12/18/15 0330 12/18/15 0427       Assessment/Plan S/p procedures: 1. Exploratory laparotomy. 2. Lysis of adhesions. 3. Removal of previously placed mesh. 4. Complex abdominal wall closure and hernia repair with biologic mesh (20 cm x 25 cm Phasix).-- 12/21/15 Dr. Ninfa Linden - POD#13 - RLQ drain 55cc/24hr - Appreciate WOC recommendations:  Mepitel to protect mesh,1 piece of white foam, 1 piece of black foam, suction to 152mm/hg pt tolerated without c/o pain - WBC 8.6 yesterday, afebrile - having bowel function - suppressed appetite but patient states she did eat more yesterday than any other day since surgery  Hyponatremia - 133, per medicine Hypokalemia - resolved, 4.1 Anemia - Hg 9.1yesterday, continue to monitor PNA- completed zosyn therapy for PNA per medicine Chronic pain - started back on methadone 10/8 GERD- protonix Hiatal hernia Malnourished - prealbumin - 9.1 - 01/01/2016  ID - was on cipro/flagyl for 8 days, now zosyn 10/7>>10/16 FEN - soft, Boost VTE - SCD, lovenox   Plan - continue to encourage diet, had long discussion with patient on importance of nutrition for healing. Continue to encourage mobilization. Continue vac; discussed with WOC who feels vac will give mesh the best chance for healing therefore would recommend continuing vac long term. Per medicine patient no longer needs zosyn for PNA; discussed with MD and patient is >7 days out from surgery with no signs of acute infection therefore will d/c zosyn today.   LOS: 16 days    Jerrye Beavers , [facility] Surgery 01/03/2016, 10:36 AM Pager: [phone] Consults: [phone] Mon-Fri 7:00 am-4:30 pm Sat-Sun 7:00 am-11:30 am  Agree with above. Complains of indigestion - heartburn.  Will try some antacid. Otherwise, clinically, she looks okay.   Alphonsa Overall, MD, [facility] Surgery Pager: [phone] Office phone:  [phone]

## 2016-01-03 NOTE — Progress Notes (Signed)
Pt was complaining of itchiness , buttocks and perineum was raw d/c foley and flexiseal) on call MD ordered benadryl po and cream applied as ordered.

## 2016-01-04 DIAGNOSIS — R5383 Other fatigue: Secondary | ICD-10-CM

## 2016-01-04 DIAGNOSIS — I959 Hypotension, unspecified: Secondary | ICD-10-CM

## 2016-01-04 LAB — COMPREHENSIVE METABOLIC PANEL
ALBUMIN: 1.7 g/dL — AB (ref 3.5–5.0)
ALT: 12 U/L — AB (ref 14–54)
AST: 19 U/L (ref 15–41)
Alkaline Phosphatase: 116 U/L (ref 38–126)
Anion gap: 5 (ref 5–15)
BUN: 6 mg/dL (ref 6–20)
CHLORIDE: 101 mmol/L (ref 101–111)
CO2: 29 mmol/L (ref 22–32)
CREATININE: 0.61 mg/dL (ref 0.44–1.00)
Calcium: 8.8 mg/dL — ABNORMAL LOW (ref 8.9–10.3)
GFR calc Af Amer: >60 mL/min (ref >60)
GFR calc non Af Amer: >60 mL/min (ref >60)
Glucose, Bld: 103 mg/dL — ABNORMAL HIGH (ref 65–99)
Potassium: 3.6 mmol/L (ref 3.5–5.1)
SODIUM: 135 mmol/L (ref 135–145)
Total Bilirubin: 0.3 mg/dL (ref 0.3–1.2)
Total Protein: 5.5 g/dL — ABNORMAL LOW (ref 6.5–8.1)

## 2016-01-04 LAB — CBC
HCT: 28.2 % — ABNORMAL LOW (ref 36.0–46.0)
Hemoglobin: 9.1 g/dL — ABNORMAL LOW (ref 12.0–15.0)
MCH: 30.6 pg (ref 26.0–34.0)
MCHC: 32.3 g/dL (ref 30.0–36.0)
MCV: 94.9 fL (ref 78.0–100.0)
PLATELETS: 458 10*3/uL — AB (ref 150–400)
RBC: 2.97 MIL/uL — AB (ref 3.87–5.11)
RDW: 14.4 % (ref 11.5–15.5)
WBC: 7.7 10*3/uL (ref 4.0–10.5)

## 2016-01-04 LAB — MAGNESIUM: Magnesium: 1.6 mg/dL — ABNORMAL LOW (ref 1.7–2.4)

## 2016-01-04 MED ORDER — LIDOCAINE 5 % EX PTCH
2.0000 | MEDICATED_PATCH | CUTANEOUS | Status: DC
  Administered 2016-01-04 – 2016-01-06 (×3): 2 via TRANSDERMAL
  Filled 2016-01-04 (×3): qty 2

## 2016-01-04 MED ORDER — HYDROMORPHONE HCL 2 MG/ML IJ SOLN
0.5000 mg | INTRAMUSCULAR | Status: DC | PRN
  Administered 2016-01-05 (×2): 0.5 mg via INTRAVENOUS
  Filled 2016-01-04 (×2): qty 1

## 2016-01-04 NOTE — Progress Notes (Signed)
Pt refused to take her vital signs, refused to change her pads/sheets, refused to empty her JP drain because she still want to sleep.

## 2016-01-04 NOTE — Progress Notes (Signed)
Patient ID: Monique Russo, female   DOB: 1930/11/14, 82 y.o.   MRN: [id number]  [facility] Surgery Progress Note  14 Days Post-Op  Subjective: Complaining of mostly back pain today. Worried that her family is not going to come see her today. No family at bedside this morning. States that she ate a hamburger last night, but has no appetite this morning. BM this morning.  Objective: Vital signs in last 24 hours: Temp:  [98.4 F (36.9 C)-99.4 F (37.4 C)] 98.4 F (36.9 C) (10/16 2100) Pulse Rate:  [78-82] 82 (10/16 2100) Resp:  [18] 18 (10/16 2100) BP: (94-95)/(49-52) 95/52 (10/16 2100) SpO2:  [98 %-100 %] 100 % (10/16 2100) Last BM Date: 01/02/16  Intake/Output from previous day: 10/16 0701 - 10/17 0700 In: 243 [P.O.:240; I.V.:3] Out: 211 [Urine:51; Drains:160] Intake/Output this shift: No intake/output data recorded.  PE: Gen:  Alert, NAD, pleasant Pulm:  CTAB Abd: Soft, NT/ND, +BS, midline incision with staples intact and vac in place, drain site cdi with small amount serosanguinous/slightly cloudy drainage  Lab Results:   Recent Labs  01/02/16 0445 01/04/16 0428  WBC 8.6 7.7  HGB 9.1* 9.1*  HCT 27.9* 28.2*  PLT 451* 458*   BMET  Recent Labs  01/04/16 0428  NA 135  K 3.6  CL 101  CO2 29  GLUCOSE 103*  BUN 6  CREATININE 0.61  CALCIUM 8.8*   PT/INR No results for input(s): LABPROT, INR in the last 72 hours. CMP     Component Value Date/Time   NA 135 01/04/2016 0428   K 3.6 01/04/2016 0428   CL 101 01/04/2016 0428   CO2 29 01/04/2016 0428   GLUCOSE 103 (H) 01/04/2016 0428   BUN 6 01/04/2016 0428   CREATININE 0.61 01/04/2016 0428   CALCIUM 8.8 (L) 01/04/2016 0428   PROT 5.5 (L) 01/04/2016 0428   ALBUMIN 1.7 (L) 01/04/2016 0428   AST 19 01/04/2016 0428   ALT 12 (L) 01/04/2016 0428   ALKPHOS 116 01/04/2016 0428   BILITOT 0.3 01/04/2016 0428   GFRNONAA >60 01/04/2016 0428   GFRAA >60 01/04/2016 0428   Lipase     Component Value  Date/Time   LIPASE 12 (L) 08/06/2014 2020       Studies/Results: No results found.  Anti-infectives: Anti-infectives    Start     Dose/Rate Route Frequency Ordered Stop   12/30/15 1500  piperacillin-tazobactam (ZOSYN) IVPB 3.375 g  Status:  Discontinued     3.375 g 12.5 mL/hr over 240 Minutes Intravenous Every 8 hours 12/30/15 1405 01/03/16 1529   12/29/15 1300  piperacillin-tazobactam (ZOSYN) IVPB 3.375 g  Status:  Discontinued     3.375 g 12.5 mL/hr over 240 Minutes Intravenous Every 8 hours 12/29/15 1016 12/30/15 1032   12/25/15 1300  piperacillin-tazobactam (ZOSYN) IVPB 3.375 g  Status:  Discontinued     3.375 g 12.5 mL/hr over 240 Minutes Intravenous Every 8 hours 12/25/15 1218 12/29/15 1016   12/20/15 1000  ciprofloxacin (CIPRO) IVPB 400 mg  Status:  Discontinued     400 mg 200 mL/hr over 60 Minutes Intravenous 2 times daily 12/20/15 0907 12/25/15 1151   12/20/15 0945  metroNIDAZOLE (FLAGYL) IVPB 500 mg  Status:  Discontinued     500 mg 100 mL/hr over 60 Minutes Intravenous Every 8 hours 12/20/15 0907 12/25/15 1151   12/18/15 0430  ciprofloxacin (CIPRO) IVPB 400 mg  Status:  Discontinued     400 mg 200 mL/hr over 60 Minutes Intravenous 2  times daily 12/18/15 0427 12/20/15 0904   12/18/15 0430  metroNIDAZOLE (FLAGYL) IVPB 500 mg  Status:  Discontinued     500 mg 100 mL/hr over 60 Minutes Intravenous Every 8 hours 12/18/15 0427 12/20/15 0904   12/18/15 0345  piperacillin-tazobactam (ZOSYN) IVPB 3.375 g  Status:  Discontinued     3.375 g 100 mL/hr over 30 Minutes Intravenous  Once 12/18/15 0330 12/18/15 0427       Assessment/Plan S/p procedures: 1. Exploratory laparotomy. 2. Lysis of adhesions. 3. Removal of previously placed mesh. 4. Complex abdominal wall closure and hernia repair with biologic mesh (20 cm x 25 cm Phasix).-- 12/21/15 Dr. Ninfa Linden - POD#14 - RLQ drain 60cc/24hr - Appreciate WOC recommendations: Mepitel to protect mesh,1 piece of white foam, 1  piece of black foam, suction to 133mm/hg pt tolerated without c/o pain - WBC 7.7, afebrile - having bowel function - continues to have minimal appetite  Hyponatremia - resolved, 135 Hypokalemia - resolved, 3.6 Anemia - Hg 9.1 stable, continue to monitor PNA- completed zosyn therapy for PNA per medicine Chronic pain - started back on methadone 10/8 GERD- protonix Hiatal hernia Malnourished - prealbumin 9.1 (01/01/16)  ID - 8 days cipro/flagyl completed, zosyn 10/7>>10/16 FEN - soft, Boost VTE - SCD, lovenox   Plan - Continue to encourage increased PO intake and mobilization. Continue vac, will likely be discharged with vac; please page me tomorrow during vac change.   LOS: 17 days    [facility] 01/04/2016, 9:06 AM Pager: [phone] Consults: [phone] Mon-Fri 7:00 am-4:30 pm Sat-Sun 7:00 am-11:30 am  Agree with above. Possibly to SNF tomorrow. Since she is more than 14 days - get staples out with dressing change. Probably time to get the abdominal drain out.  Alphonsa Overall, MD, [facility] Surgery Pager: [phone] Office phone:  [phone]

## 2016-01-04 NOTE — Progress Notes (Signed)
PROGRESS NOTE  Monique Russo [id number] DOB: 26-Apr-1930 DOA: 12/17/2015 PCP: Horatio Pel, MD   LOS: 17 days   Brief Narrative: Monique Russo a 82 y.o.femalewith a past medical history significant for chronic pain on methadone and chronic non-healing surgical woundwho presented with abdominal pain and vomiting for 3 days. The patient had a perforated ulcer 1 year ago requiring [name] patch.This surgery was unfortunately complicated by nonhealing surgical wound (because of 82 year old infected hernia repair mesh?), Which for the last year the patient has been having wound care by her general surgeon in the office every six weeks (per family). This week, two days prior to admission, the patient had an office debridement and described that Dr. Excell Seltzer did his usual debridement, after which she began to develop nausea and LLQ abdominal pain. This crampy, severe, LLQ aching abdominal pain continued intermittently and was associated with several episodes of nonbloody emesis and obstipation.   On the morning of admission, she was recommended by surgery office for ER evaluation. CT of the abdomen and pelvis was obtained that showed small bowel obstruction with transition point in the right mid abdomenand stranding around the colon, consistent with diverticulitis. Surgery was consulted and patient was admitted for further workup. Patient's condition did not improve and she underwent ExLap, LOA, removal of prior mesh and complex abdominal wall closure and hernia repair with biologic mesh on 10/3. Postoperative course was initially complicated by mild ileus, and patient needed to be started on TPN, however she is improving now, and her diet is advanced but she is eating very less, not needing her daily calorie count. On 10/9, patient started developing significant watery diarrhea requiring flexi-seal which has now been removed. C diff is checked and returned negative.   Assessment &  Plan: Principal Problem:   Small bowel obstruction Active Problems:   Hyponatremia   Chronic pain syndrome   Diverticulitis of large intestine without perforation or abscess without bleeding   SBO (small bowel obstruction)   Chronic bilateral low back pain without sciatica   Difficulty in walking, not elsewhere classified   Disorder of sleep wake schedule   Protein-calorie malnutrition, severe (HCC)   End of life care   Goals of care, counseling/discussion   Palliative care by specialist  Hypotension multifactorial, blood pressure has trended down again could be secondary to poor oral intake, underlying infection, minimize narcotics Minimize Ativan,   blood pressure improved after transfusion, but trending down again Does not appear to be septic, blood culture from 10/13 no growth so far Zosyn discontinued by surgery on 10/16  Small bowel obstruction  abdominal x-ray 10/3 showed persistent high-grade small bowel obstruction, complicated surgical history, very poor nutritional status. However patient is having daily bowel movements - general surgery, following, patient underwent ExLap, LOA, removal of prior mesh and complex abdominal wall closure and hernia repair with biologic mesh on 10/3.[facility] team will provide Vac changes on M-W-F. Will probably discharge to SNF with wound VAC Off TPN Surgical team to make further recommendations regarding wound care/antibiotics, expected for wound healing given extremely poor nutritional status, and future recommendations regarding placement of feeding tube etc. Personally discussed  prognosis with the daughter/husband from a medical standpoint, not sure what her prognosis is from a surgical standpoint. Discussed with surgery APP on 10/16  foley dc'd    Diarrhea-resolved - Patient developed significant amounts of watery diarrhea on 10/9, requiring a Flexi Seal placement, C. difficile  Negative, we had not removed flexiseal  Severe protein  calorie malnutrition-albumin 1.7, slow to progress, surgical prognosis/wound healing appears to be poor  Diverticulitis - There is stranding around the colon that suggests diverticulitis. - was on Ciprofloxacin and Flagyl IV for 8 days. Now on Zosyn, now Zosyn has been discontinued on 10/16   Aspiration PNA vs HCAP,  - SLP evaluation ordered, discussed at length with daughter, she understands aspiration risk - CXR with developing PNA, last chest x-ray was done on 10/7      Somnolence-multiple medications other than Zyprexa caused patient to be sedated Discontinued trazodone, minimize narcotics, doses adjusted   Hyponatremia - Improving    Chronic pain syndrome Continue home dose of methadone Medications that can help with opioid withdrawal symptoms during a taper include clonidine, hydroxyzine, Tylenol or ibuprofen, hyocosamine, ondansetron or Phenergan, and fluids.  - pain improved now. Minimize narcotics like oxycodone and Dilaudid, patient already on scheduled methadone - QTC 450, continue methadone at home dose without tapering   GERD and hiatal hernia Continue PPI - DG barium esophagus showed marked lower esophageal tortuosity with large hiatal hernia but no mass. For definitive imaging, patient will need EGD but this can be done at a later date.  That she will need close outpatient gastroenterology follow-up once her acute illness is resolved   Acute hypoxic respiratory failure - post op requiring 4L [place],   wean oxygen to keep saturation greater than 90% - CXR without overt pulmonary edema 10/4 however developing infiltrate 10/7. Abx as above - closely monitor respiratory status Try to ambulate the patient  Anemia of chronic disease  Hg dropped from 13.9>7.2, Corrected appropriately after 2 units, hemoglobin now 9.1   DVT prophylaxis: SCD Code Status: Full Family Communication: d/w daughter at bedside Disposition Plan: remain inpatient, DO NOT RESUSCITATE,  anticipated discharge will be based on surgery recommendations   Consultants:   General surgery  Palliative care  Procedures:   ExLap, LOA, removal of prior mesh and complex abdominal wall closure and hernia repair with biologic mesh on 10/3.  Foley: yes  Antimicrobials:  Ciprofloxacin 9/30 >> 10/7  Flagyl 9/30 >> 10/7  Zosyn 10/7 >>  Subjective: Continues to progress poorly, blood pressure low, no energy, not eating much  Objective: Vitals:   01/02/16 1333 01/02/16 2103 01/03/16 1300 01/03/16 2100  BP: (!) 105/45 (!) 86/43 (!) 94/49 (!) 95/52  Pulse: 83 89 78 82  Resp:  18  18  Temp: 97.8 F (36.6 C) 98.7 F (37.1 C) 99.4 F (37.4 C) 98.4 F (36.9 C)  TempSrc: Oral Oral Oral Oral  SpO2: 100% 98% 98% 100%  Weight:      Height:        Intake/Output Summary (Last 24 hours) at 01/04/16 1114 Last data filed at 01/04/16 0958  Gross per 24 hour  Intake              483 ml  Output              211 ml  Net              272 ml   Filed Weights   12/17/15 2249 12/18/15 1238 12/31/15 0905  Weight: 73 kg (161 lb) 72.6 kg (160 lb) 89.6 kg (197 lb 8.5 oz)    Examination: Constitutional: in pain,appears distressed Vitals:   01/02/16 1333 01/02/16 2103 01/03/16 1300 01/03/16 2100  BP: (!) 105/45 (!) 86/43 (!) 94/49 (!) 95/52  Pulse: 83 89 78 82  Resp:  18  18  Temp: 97.8 F (36.6 C) 98.7 F (37.1 C) 99.4 F (37.4 C) 98.4 F (36.9 C)  TempSrc: Oral Oral Oral Oral  SpO2: 100% 98% 98% 100%  Weight:      Height:       Eyes: PERRL Neck: normal, supple Respiratory: no wheezing, gurgling sounds Cardiovascular: Regular rate and rhythm, no murmurs / rubs / gallops. No LE edema. Abdomen: dressing in place, C/D/I Musculoskeletal: no clubbing / cyanosis. Neurologic: non focal    Data Reviewed: I have personally reviewed following labs and imaging studies  CBC:  Recent Labs Lab 12/30/15 0415 12/31/15 0420 01/01/16 0530 01/02/16 0445 01/04/16 0428  WBC  14.4* 13.4* 11.2* 8.6 7.7  HGB 7.5* 7.2* 9.1* 9.1* 9.1*  HCT 21.9* 21.7* 27.3* 27.9* 28.2*  MCV 92.0 91.2 91.0 91.8 94.9  PLT 430* 393 404* 451* [id number]*   Basic Metabolic Panel:  Recent Labs Lab 12/29/15 0430 12/30/15 0415 12/31/15 0420 01/01/16 0530 01/04/16 0428  NA 131* 130* 132* 133* 135  K 4.8 4.3 4.7 4.1 3.6  CL 99* 98* 100* 101 101  CO2 26 27 26 27 29   GLUCOSE 108* 112* 97 102* 103*  BUN 11 11 9 9 6   CREATININE 0.60 0.67 0.69 0.62 0.61  CALCIUM 8.4* 8.6* 8.7* 8.8* 8.8*  MG  --  1.8  --  1.8  --   PHOS  --  3.8  --  3.4  --    GFR: Estimated Creatinine Clearance: 53.1 mL/min (by C-G formula based on SCr of 0.61 mg/dL). Liver Function Tests:  Recent Labs Lab 12/29/15 0430 12/30/15 0415 12/31/15 0420 01/01/16 0530 01/04/16 0428  AST 14* 13* 16 14* 19  ALT 8* 8* 8* 10* 12*  ALKPHOS 55 58 94 94 116  BILITOT 0.2* 0.2* 0.3 0.5 0.3  PROT 4.9* 4.9* 4.9* 5.1* 5.5*  ALBUMIN 1.3* 1.5* 1.4* 1.5* 1.7*   CBG:  Recent Labs Lab 12/28/15 1151 12/30/15 0733 12/30/15 1237  GLUCAP 143* 103* 133*   Lipid Profile:  Recent Labs  01/03/16 0500  TRIG 131   Thyroid Function Tests: No results for input(s): TSH, T4TOTAL, FREET4, T3FREE, THYROIDAB in the last 72 hours. Anemia Panel: No results for input(s): VITAMINB12, FOLATE, FERRITIN, TIBC, IRON, RETICCTPCT in the last 72 hours. Urine analysis:    Component Value Date/Time   COLORURINE AMBER (A) 12/18/2015 1507   APPEARANCEUR CLEAR 12/18/2015 1507   LABSPEC >1.046 (H) 12/18/2015 1507   PHURINE 5.5 12/18/2015 1507   GLUCOSEU NEGATIVE 12/18/2015 1507   HGBUR NEGATIVE 12/18/2015 1507   BILIRUBINUR SMALL (A) 12/18/2015 1507   KETONESUR NEGATIVE 12/18/2015 1507   PROTEINUR NEGATIVE 12/18/2015 1507   UROBILINOGEN 0.2 08/04/2014 2100   NITRITE NEGATIVE 12/18/2015 1507   LEUKOCYTESUR NEGATIVE 12/18/2015 1507   Sepsis Labs: Invalid input(s): PROCALCITONIN, LACTICIDVEN  Recent Results (from the past 240 hour(s))  C  difficile quick scan w PCR reflex     Status: None   Collection Time: 12/27/15  1:52 PM  Result Value Ref Range Status   C Diff antigen NEGATIVE NEGATIVE Final   C Diff toxin NEGATIVE NEGATIVE Final   C Diff interpretation Negative for C. difficile  Final  Culture, blood (x 2)     Status: None (Preliminary result)   Collection Time: 12/31/15  9:50 AM  Result Value Ref Range Status   Specimen Description BLOOD LEFT HAND  Final   Special Requests BOTTLES DRAWN AEROBIC AND ANAEROBIC 5CC  Final   Culture NO GROWTH 3 DAYS  Final   Report Status PENDING  Incomplete  Culture, blood (x 2)     Status: None (Preliminary result)   Collection Time: 12/31/15  9:57 AM  Result Value Ref Range Status   Specimen Description BLOOD LEFT ARM  Final   Special Requests BOTTLES DRAWN AEROBIC ONLY 5CC  Final   Culture NO GROWTH 3 DAYS  Final   Report Status PENDING  Incomplete      Radiology Studies: No results found.   Scheduled Meds: . sodium chloride   Intravenous Once  . DULoxetine  60 mg Oral QHS  . enoxaparin (LOVENOX) injection  40 mg Subcutaneous Q24H  . lactose free nutrition  237 mL Oral TID WC  . methadone  5 mg Oral Q8H  . pantoprazole  40 mg Oral Daily  . sodium chloride flush  3 mL Intravenous Q12H  . timolol  1 drop Both Eyes QHS  . timolol  2 drop Both Eyes Daily   Continuous Infusions:    Marzetta Board, MD, PhD [facility] Pager [phone]  If 7PM-7AM, please contact night-coverage [email or web address] Password [email or web address] 01/04/2016, 11:14 AM

## 2016-01-04 NOTE — Progress Notes (Signed)
Daily Progress Note   Patient Name: Monique Russo       Date: 01/04/2016 DOB: 09/06/1930  Age: 82 y.o. MRN#: [id number] Attending Physician: Reyne Dumas, MD Primary Care Physician: Horatio Pel, MD Admit Date: 12/17/2015  Reason for Consultation/Follow-up: Establishing goals of care, Non pain symptom management and Pain control  Subjective: Evaluated patient. No family at bedside. Patient states her main complaint today is back pain. States "my spine is gone, I'm eat up with arthritis". This is a chronic pain she has had for several years. Describes as an ache. Also has lower leg tenderness on palpation. She also says she has had this for years, "just like my mother". She does not have an appetite. Ate small amount of breakfast. She does not want to get up with PT, says just getting up to the toilet takes a great amount of effort. States she slept well last night. Her goal is to discharge to SNF and hopefully home afterward.  She has ongoing wound care with wound VAC in place.   Review of Systems  Unable to perform ROS: Other  Constitutional: Positive for malaise/fatigue.       Anorexia  HENT: Negative.   Eyes: Negative.   Respiratory: Positive for shortness of breath.   Cardiovascular: Negative.   Gastrointestinal: Negative.   Genitourinary: Negative.   Musculoskeletal: Positive for back pain and myalgias.  Skin: Positive for itching.  Neurological: Negative.   Endo/Heme/Allergies: Negative.   Psychiatric/Behavioral: The patient has insomnia.     Length of Stay: 17  Current Medications: Scheduled Meds:  . sodium chloride   Intravenous Once  . DULoxetine  60 mg Oral QHS  . enoxaparin (LOVENOX) injection  40 mg Subcutaneous Q24H  . lactose free nutrition  237 mL Oral  TID WC  . lidocaine  2 patch Transdermal Q24H  . methadone  5 mg Oral Q8H  . pantoprazole  40 mg Oral Daily  . sodium chloride flush  3 mL Intravenous Q12H  . timolol  1 drop Both Eyes QHS  . timolol  2 drop Both Eyes Daily    Continuous Infusions:    PRN Meds: sodium chloride, acetaminophen **OR** acetaminophen, alum & mag hydroxide-simeth, diphenhydrAMINE, diphenhydrAMINE-zinc acetate, HYDROmorphone (DILAUDID) injection, methocarbamol, ondansetron (ZOFRAN) IV, oxyCODONE, sodium chloride flush, sodium chloride flush, sodium chloride flush, temazepam  Physical Exam  Constitutional: She is oriented to person, place, and time. She appears well-developed. No distress.  Frail  HENT:  Head: Normocephalic.  Eyes: Conjunctivae and EOM are normal.  Cardiovascular: Normal rate and regular rhythm.   Pulmonary/Chest: Effort normal.  Diminished   Abdominal: Soft. There is tenderness.  Open surgical wound, dressing in place  Musculoskeletal: She exhibits edema (BLE) and tenderness.  Neurological: She is alert and oriented to person, place, and time.  Sleepy, arouseable  Skin: Skin is warm and dry.  Psychiatric:  Flat affect            Vital Signs: BP (!) 95/52 (BP Location: Left Wrist)   Pulse 82   Temp 98.4 F (36.9 C) (Oral)   Resp 18   Ht 5' (1.524 m)   Wt 89.6 kg (197 lb 8.5 oz)   SpO2 100%   BMI 38.58 kg/m  SpO2: SpO2: 100 % O2 Device: O2 Device: Nasal Cannula O2 Flow Rate: O2 Flow Rate (L/min): 4 L/min  Intake/output summary:   Intake/Output Summary (Last 24 hours) at 01/04/16 1214 Last data filed at 01/04/16 [id number]  Gross per 24 hour  Intake              483 ml  Output              211 ml  Net              272 ml   LBM: Last BM Date: 01/02/16 Baseline Weight: Weight: 73 kg (161 lb) Most recent weight: Weight: 89.6 kg (197 lb 8.5 oz)       Palliative Assessment/Data: PPS: 30%   Flowsheet Rows   Flowsheet Row Most Recent Value  Intake Tab  Referral  Department  Hospitalist  Unit at Time of Referral  Med/Surg Unit  Palliative Care Primary Diagnosis  Pain  Date Notified  12/29/15  Palliative Care Type  Return patient Palliative Care  Reason for referral  Pain  Date of Admission  12/17/15  # of days IP prior to Palliative referral  12  Clinical Assessment  Psychosocial & Spiritual Assessment  Palliative Care Outcomes      Patient Active Problem List   Diagnosis Date Noted  . Difficulty in walking, not elsewhere classified   . Disorder of sleep wake schedule   . Protein-calorie malnutrition, severe ([place])   . End of life care   . Goals of care, counseling/discussion   . Palliative care by specialist   . Chronic bilateral low back pain without sciatica   . Small bowel obstruction 12/18/2015  . Diverticulitis of large intestine without perforation or abscess without bleeding 12/18/2015  . SBO (small bowel obstruction) 12/18/2015  . Left ventricular dysfunction  reported in chart, not confirmed on review of echo 11/28/2014  . Pulmonary arterial hypertension 11/28/2014  . Perforated chronic gastric ulcer ([place]) 08/11/2014  . Encounter for intubation   . Chronic pain syndrome   . Hyponatremia 07/17/2014  . DNR (do not resuscitate) 07/07/2013  . Palliative care encounter 07/07/2013  . Narcotic overdose 07/03/2013    Palliative Care Assessment & Plan   Patient Profile:  82 y.o. female  with past medical history of perforated bowel, small bowel obstruction, pulmonary arterial hypertension, chronic pain, chronic non healing abdominal surgical wound admitted on 12/17/2015 with abdominal pain and vomiting. Workup revealed Small bowel obstruction. She previously had surgery for perforated ulcer 1 year ago that was complicated by a non healing wound and resultant infection. She  required surgery again during this hospitalization for exploratory lap, infected abdominal mesh and small bowel obstruction. Post operative has been complicated by  ileus, poor nutritional status, watery diarrhea (C diff negative), pain, and now with chest xray showing likely pneumonia. TPN discontinued per surgery d/t patient with working gut and able to have po intake. She is now hypotensive with unknown etiology (?result of hypoalbuminemia?).   Assessment/Recommendations/Plan   Pain- c/o chronic pain mainly in her back. Encourage prn Tylenol vs narcotics d/t sedation, will start lidoderm patches. Getting out of bed with PT would be beneficial, but patient is reluctant to participate.  Cachexia/Severe protein calorie malnutrition-  Albumin 1.7 (high indicator of mortality), she has no appetite- recommend considering adding adjuvant dexamethasone for pain relief and appetite stimulant (would rec start with 1mg  QD and titrate up) Will hold off for now to see how patient does with Tylenol and lidoderm patches. This may also be adding to hypotension.  Day/Night sleep cycle interruption- improved. She is oriented to time and sleeping at night.  Palliative medicine will continue to monitor and assist with symptoms as needed.   Goals of Care and Additional Recommendations:  Limitations on Scope of Treatment: Full Scope Treatment  Code Status:  DNR  Prognosis:   < 3 months d/t severe malnutrition, difficulty with wound healing and decreased functional status  Discharge Planning:  To Be Determined  Care plan was discussed with patient.  Thank you for allowing the Palliative Medicine Team to assist in the care of this patient.   Time In: 1200 Time Out: 1230 Total Time 30 mins Prolonged Time Billed No      Greater than 50%  of this time was spent counseling and coordinating care related to the above assessment and plan.  Mariana Kaufman, AGNP-C Palliative Medicine   Please contact Palliative Medicine Team phone at [phone] for questions and concerns.

## 2016-01-04 NOTE — Progress Notes (Signed)
Physical Therapy Re-eval Patient Details Name: Monique Russo MRN: [id number] DOB: 1930/04/27 Today's Date: 01/04/2016    History of Present Illness 82 y.o. female with a past medical history significant for chronic pain on methadone and chronic non-healing surgical wound who presented with abdominal pain and vomiting for 3 days. Now s/p ExLap, LOA, removal of prior mesh and complex abdominal wall closure and hernia repair with biologic mesh on 10/3.    PT Comments    Pt is uncooperative with PT she states she does not want to be bothered or "poked and prodded" anymore, she refuses any and all activity now or in the future. She is states she wants "to die rather than live in this pain"; PT will sign off (second time); no further needs at this time (obviously attempted to explain and encourage pt but to no avail); pt perseverates on her pain and meds--had all pain  meds prior to this re-eval and pt still uncooperative, demanding tylenol, pt reports she takes much more pain medication at home when she is "in control of it"  Possibly Palliative Care re-consult may be beneficial since her pain is not controlled and it does not appear that she and her family are on the same page with the plan of care (?).    Follow Up Recommendations   (needs SNF/long term care; pt declines any further PT here or  next venue of care)     Equipment Recommendations  None recommended by PT    Recommendations for Other Services       Precautions / Restrictions Precautions Precautions: Fall Precaution Comments: wound VAC Restrictions Weight Bearing Restrictions: No    Mobility  Bed Mobility               General bed mobility comments: attempted, pt adamantly refused  Transfers                    Ambulation/Gait                 Stairs            Wheelchair Mobility    Modified Rankin (Stroke Patients Only)       Balance Overall balance assessment:  (unable to  assess, pt refuses)                                  Cognition Arousal/Alertness: Awake/alert Behavior During Therapy: Anxious Overall Cognitive Status: Within Functional Limits for tasks assessed                      Exercises      General Comments        Pertinent Vitals/Pain Pain Assessment: 0-10 Pain Score: 10-Worst pain ever Pain Location: back Pain Descriptors / Indicators: Constant Pain Intervention(s): Limited activity within patient's tolerance;Monitored during session;Premedicated before session    Home Living Family/patient expects to be discharged to:: Skilled nursing facility (wants to go home) Living Arrangements: Spouse/significant other Available Help at Discharge: Family;Available 24 hours/day;Personal care attendant   Home Access: Stairs to enter Entrance Stairs-Rails: None Home Layout: One level Home Equipment: Wheelchair - power;Tub bench Additional Comments: aide 2 times per week for 2 hours to help with bathing--assist pt to get into tub with seat, she does step over assisted.    Prior Function    Gait / Transfers Assistance Needed: power chair ar baseline. lateral scooting for  transfers. Does not stand at baseline, does sit EOB  (from OT eval).  per daughter she can transfer into her electric chair, can transfer to the toilet byherself and can preform peri care by herself at baseline.  Husband has to help her down the one step and immediately in the car when entering and exiting her home, but he can literally pull up to the door and she only has to take one or two assisted steps to the car. (info per previous notes from 1 wk ago and per discussion with pt) ADL's / Homemaking Assistance Needed: aide assists with bathing/dressing     PT Goals (current goals can now be found in the care plan section) Acute Rehab PT Goals Patient Stated Goal: to be left alone! PT Goal Formulation: All assessment and education complete, DC  therapy Potential to Achieve Goals: Poor    Frequency           PT Plan      Co-evaluation             End of Session   Activity Tolerance: Patient limited by fatigue;Patient limited by pain Patient left: in bed;with call bell/phone within reach     Time: [id number] PT Time Calculation (min) (ACUTE ONLY): 14 min  Charges:                       G CodesKenyon Ana 01/23/2016, 10:44 AM

## 2016-01-04 NOTE — Progress Notes (Signed)
Nutrition Follow-up  DOCUMENTATION CODES:   Obesity unspecified  INTERVENTION:    Continue Snacks TID  Continue Boost Plus PO TID, each supplement provides 360 kcal and 14 gm protein  NUTRITION DIAGNOSIS:   Inadequate oral intake related to poor appetite as evidenced by per patient/family report.  Ongoing  GOAL:   Patient will meet greater than or equal to 90% of their needs  Progressing  MONITOR:   PO intake, Supplement acceptance, Labs, Weight trends  REASON FOR ASSESSMENT:   Consult New TPN/TNA  ASSESSMENT:   Monique Russo is a 82 y.o. female with a past medical history significant for chronic pain on methadone and chronic non-healing surgical wound who presents with abdominal pain and vomiting for 3 days.  Wound vac placed on 12/31/15. Per [facility] note, vac changes on 01/03/16.   Flexiseal d/c on 01/01/16.   Per chart review, intake continues to be poor (PO: 25-100%). Md notes indicates she consumed a cheese sandwich and hamburger during the last two dinner periods, however, did not consume much breakfast. She is taking Boost Plus supplements. Pt with increased nutrient needs related to wound vac,  Case discussed with RN; pt requested not to be woken up because she was resting.   RNCM and CSW notes reviewed; plan to d/c to SNF once stable.  Labs reviewed: Mg: 1.6.   Diet Order:  DIET SOFT Room service appropriate? Yes; Fluid consistency: Thin  Skin:  Wound (see comment) (abdominal wound vac)  Last BM:  01/02/16  Height:   Ht Readings from Last 1 Encounters:  12/18/15 5' (1.524 m)    Weight:   Wt Readings from Last 1 Encounters:  12/31/15 197 lb 8.5 oz (89.6 kg)    Ideal Body Weight:  45.5 kg  BMI:  Body mass index is 38.58 kg/m.  Estimated Nutritional Needs:   Kcal:  1400-1600  Protein:  75-90 grams  Fluid:  >1.4 L  EDUCATION NEEDS:   No education needs identified at this time  Monique Russo A. Jimmye Norman, RD, LDN, CDE Pager:  [phone] After hours Pager: [phone]

## 2016-01-05 LAB — CULTURE, BLOOD (ROUTINE X 2)
CULTURE: NO GROWTH
CULTURE: NO GROWTH

## 2016-01-05 MED ORDER — MAGNESIUM SULFATE 2 GM/50ML IV SOLN
2.0000 g | Freq: Once | INTRAVENOUS | Status: AC
Start: 2016-01-05 — End: 2016-01-05
  Administered 2016-01-05: 2 g via INTRAVENOUS
  Filled 2016-01-05: qty 50

## 2016-01-05 NOTE — Progress Notes (Signed)
PROGRESS NOTE  Monique Russo [id number] DOB: 06-27-1930 DOA: 12/17/2015 PCP: Horatio Pel, MD   LOS: 18 days   Brief Narrative: Monique Russo a 82 y.o.femalewith a past medical history significant for chronic pain on methadone and chronic non-healing surgical woundwho presented with abdominal pain and vomiting for 3 days. The patient had a perforated ulcer 1 year ago requiring Monique Russo patch.This surgery was unfortunately complicated by nonhealing surgical wound (because of 82 year old infected hernia repair mesh?), Which for the last year the patient has been having wound care by her general surgeon in the office every six weeks (per family). This week, two days prior to admission, the patient had an office debridement and described that Dr. Excell Seltzer did his usual debridement, after which she began to develop nausea and LLQ abdominal pain. This crampy, severe, LLQ aching abdominal pain continued intermittently and was associated with several episodes of nonbloody emesis and obstipation.   On the morning of admission, she was recommended by surgery office for ER evaluation. CT of the abdomen and pelvis was obtained that showed small bowel obstruction with transition point in the right mid abdomenand stranding around the colon, consistent with diverticulitis. Surgery was consulted and patient was admitted for further workup. Patient's condition did not improve and she underwent ExLap, LOA, removal of prior mesh and complex abdominal wall closure and hernia repair with biologic mesh on 10/3. Postoperative course was initially complicated by mild ileus, and patient needed to be started on TPN, however she is improving now, and her diet is advanced but she is eating very less, not needing her daily calorie count. On 10/9, patient started developing significant watery diarrhea requiring flexi-seal which has now been removed. C diff is checked and returned negative.   Assessment &  Plan: Principal Problem:   Small bowel obstruction Active Problems:   Hyponatremia   Chronic pain syndrome   Diverticulitis of large intestine without perforation or abscess without bleeding   SBO (small bowel obstruction)   Chronic bilateral low back pain without sciatica   Difficulty in walking, not elsewhere classified   Disorder of sleep wake schedule   Protein-calorie malnutrition, severe (HCC)   End of life care   Goals of care, counseling/discussion   Palliative care by specialist   Hypotension   Hypotension multifactorial, blood pressure still borderline low could be secondary to poor oral intake, underlying infection, minimize narcotics Minimize Ativan,   blood pressure improved after transfusion, but trending down again Does not appear to be septic, blood culture from 10/13 no growth so far Zosyn discontinued by surgery on 10/16 Likely from poor oral intake   Small bowel obstruction  abdominal x-ray 10/3 showed persistent high-grade small bowel obstruction, complicated surgical history, very poor nutritional status. However patient is having daily bowel movements - general surgery, following, patient underwent ExLap, LOA, removal of prior mesh and complex abdominal wall closure and hernia repair with biologic mesh on 10/3.[place] team will provide Vac changes on M-W-F. Will probably discharge to SNF with wound VAC Off TPN Surgical team to make further recommendations regarding wound care/antibiotics, expected for wound healing given extremely poor nutritional status, and future recommendations regarding placement of feeding tube etc. Personally discussed  prognosis with the daughter/husband from a medical standpoint, not sure what her prognosis is from a surgical standpoint. Discussed with surgery APP on 10/16  foley dc'd  She does not have an appetite, leading to multiple electrolyte abnormalities  Hypomagnesemia-replete with magnesium sulfate today  Wound care-Vac dressing  change to abd Dressing procedure/placement/frequency: Mepitel to protect mesh,1 piece of white foam, 1 piece of black foam, suction to 187mm/hg pt tolerated without c/o pain. [place] team will provide Vac changes on M-W-F   Diarrhea-resolved - Patient developed significant amounts of watery diarrhea on 10/9, requiring a Flexi Seal placement, C. difficile  Negative, we had not removed flexiseal     Severe protein calorie malnutrition-albumin 1.7, slow to progress, surgical prognosis/wound healing appears to be poor  Diverticulitis - There is stranding around the colon that suggests diverticulitis. - was on Ciprofloxacin and Flagyl IV for 8 days. Subsequently on Zosyn, which was discontinued on 10/16   Aspiration PNA vs HCAP,  - SLP evaluation ordered, discussed at length with daughter, she understands aspiration risk - CXR with developing PNA, last chest x-ray was done on 10/7   tracking wean down oxygen, still on 4 L   Somnolence-multiple medications other than Zyprexa caused patient to be sedated Discontinued trazodone, minimize narcotics, doses adjusted   Hyponatremia - Improving   Cachexia/Severe protein calorie malnutrition-  Albumin 1.7 (high indicator of mortality), she has no appetite  Chronic pain syndrome On methadone, Minimize narcotics like oxycodone and Dilaudid, patient already on scheduled methadone - QTC 450, continue methadone at home dose without tapering Now has acute on chronic back pain, patient not ambulating   GERD and hiatal hernia Continue PPI - DG barium esophagus showed marked lower esophageal tortuosity with large hiatal hernia but no mass. For definitive imaging, patient will need EGD but this can be done at a later date.  That she will need close outpatient gastroenterology follow-up once her acute illness is resolved   Acute hypoxic respiratory failure - post op requiring 4L [place],   wean oxygen to keep saturation greater than 90% - CXR without  overt pulmonary edema 10/4 however developing infiltrate 10/7. Abx as above - closely monitor respiratory status Try to ambulate the patient    Anemia of chronic disease  Hg dropped from 13.9>7.2, Corrected appropriately after 2 units, hemoglobin now 9.1  Prognosis:   < 3 months d/t severe malnutrition, difficulty with wound healing and decreased functional status  DVT prophylaxis: SCD Code Status: Full Family Communication: d/w daughter at bedside Disposition Plan: remain inpatient, patient refusing to participate with physical therapy, DO NOT RESUSCITATE, anticipated discharge once by mouth intake is adequately established, will need SNF  recommend f/u with community Palliative services at facility to continue [place] discussion  Consultants:   General surgery  Palliative care  Procedures:   ExLap, LOA, removal of prior mesh and complex abdominal wall closure and hernia repair with biologic mesh on 10/3.  Foley: yes  Antimicrobials:  Ciprofloxacin 9/30 >> 10/7  Flagyl 9/30 >> 10/7  Zosyn 10/7 >>  Subjective: She does not have an appetite. Ate small amount of breakfast. She does not want to get up with PT, says just getting up to the toilet takes a great amount of effort  Objective: Vitals:   01/03/16 2100 01/04/16 1341 01/04/16 2217 01/05/16 0452  BP: (!) 95/52 98/60 107/61 (!) 101/52  Pulse: 82 76 80 84  Resp: 18 18 18 18   Temp: 98.4 F (36.9 C) 98.4 F (36.9 C) 98.1 F (36.7 C) 98.3 F (36.8 C)  TempSrc: Oral Oral Oral Oral  SpO2: 100% 100% 100% 99%  Weight:      Height:        Intake/Output Summary (Last 24 hours) at 01/05/16 1005 Last data filed at 01/05/16 [id number]  Gross per 24 hour  Intake             1080 ml  Output              520 ml  Net              560 ml   Filed Weights   12/17/15 2249 12/18/15 1238 12/31/15 0905  Weight: 73 kg (161 lb) 72.6 kg (160 lb) 89.6 kg (197 lb 8.5 oz)    Examination: Constitutional: in pain,appears  distressed Vitals:   01/03/16 2100 01/04/16 1341 01/04/16 2217 01/05/16 0452  BP: (!) 95/52 98/60 107/61 (!) 101/52  Pulse: 82 76 80 84  Resp: 18 18 18 18   Temp: 98.4 F (36.9 C) 98.4 F (36.9 C) 98.1 F (36.7 C) 98.3 F (36.8 C)  TempSrc: Oral Oral Oral Oral  SpO2: 100% 100% 100% 99%  Weight:      Height:       Eyes: PERRL Neck: normal, supple Respiratory: no wheezing, gurgling sounds Cardiovascular: Regular rate and rhythm, no murmurs / rubs / gallops. No LE edema. Abdomen: dressing in place, C/D/I Musculoskeletal: no clubbing / cyanosis. Neurologic: non focal    Data Reviewed: I have personally reviewed following labs and imaging studies  CBC:  Recent Labs Lab 12/30/15 0415 12/31/15 0420 01/01/16 0530 01/02/16 0445 01/04/16 0428  WBC 14.4* 13.4* 11.2* 8.6 7.7  HGB 7.5* 7.2* 9.1* 9.1* 9.1*  HCT 21.9* 21.7* 27.3* 27.9* 28.2*  MCV 92.0 91.2 91.0 91.8 94.9  PLT 430* 393 404* 451* [id number]*   Basic Metabolic Panel:  Recent Labs Lab 12/30/15 0415 12/31/15 0420 01/01/16 0530 01/04/16 0428 01/04/16 1328  NA 130* 132* 133* 135  --   K 4.3 4.7 4.1 3.6  --   CL 98* 100* 101 101  --   CO2 27 26 27 29   --   GLUCOSE 112* 97 102* 103*  --   BUN 11 9 9 6   --   CREATININE 0.67 0.69 0.62 0.61  --   CALCIUM 8.6* 8.7* 8.8* 8.8*  --   MG 1.8  --  1.8  --  1.6*  PHOS 3.8  --  3.4  --   --    GFR: Estimated Creatinine Clearance: 53.1 mL/min (by C-G formula based on SCr of 0.61 mg/dL). Liver Function Tests:  Recent Labs Lab 12/30/15 0415 12/31/15 0420 01/01/16 0530 01/04/16 0428  AST 13* 16 14* 19  ALT 8* 8* 10* 12*  ALKPHOS 58 94 94 116  BILITOT 0.2* 0.3 0.5 0.3  PROT 4.9* 4.9* 5.1* 5.5*  ALBUMIN 1.5* 1.4* 1.5* 1.7*   CBG:  Recent Labs Lab 12/30/15 0733 12/30/15 1237  GLUCAP 103* 133*   Lipid Profile:  Recent Labs  01/03/16 0500  TRIG 131   Thyroid Function Tests: No results for input(s): TSH, T4TOTAL, FREET4, T3FREE, THYROIDAB in the last 72  hours. Anemia Panel: No results for input(s): VITAMINB12, FOLATE, FERRITIN, TIBC, IRON, RETICCTPCT in the last 72 hours. Urine analysis:    Component Value Date/Time   COLORURINE AMBER (A) 12/18/2015 1507   APPEARANCEUR CLEAR 12/18/2015 1507   LABSPEC >1.046 (H) 12/18/2015 1507   PHURINE 5.5 12/18/2015 1507   GLUCOSEU NEGATIVE 12/18/2015 1507   HGBUR NEGATIVE 12/18/2015 1507   BILIRUBINUR SMALL (A) 12/18/2015 1507   KETONESUR NEGATIVE 12/18/2015 1507   PROTEINUR NEGATIVE 12/18/2015 1507   UROBILINOGEN 0.2 08/04/2014 2100   NITRITE NEGATIVE 12/18/2015 1507   LEUKOCYTESUR NEGATIVE 12/18/2015 1507  Sepsis Labs: Invalid input(s): PROCALCITONIN, LACTICIDVEN  Recent Results (from the past 240 hour(s))  C difficile quick scan w PCR reflex     Status: None   Collection Time: 12/27/15  1:52 PM  Result Value Ref Range Status   C Diff antigen NEGATIVE NEGATIVE Final   C Diff toxin NEGATIVE NEGATIVE Final   C Diff interpretation Negative for C. difficile  Final  Culture, blood (x 2)     Status: None (Preliminary result)   Collection Time: 12/31/15  9:50 AM  Result Value Ref Range Status   Specimen Description BLOOD LEFT HAND  Final   Special Requests BOTTLES DRAWN AEROBIC AND ANAEROBIC 5CC  Final   Culture NO GROWTH 4 DAYS  Final   Report Status PENDING  Incomplete  Culture, blood (x 2)     Status: None (Preliminary result)   Collection Time: 12/31/15  9:57 AM  Result Value Ref Range Status   Specimen Description BLOOD LEFT ARM  Final   Special Requests BOTTLES DRAWN AEROBIC ONLY 5CC  Final   Culture NO GROWTH 4 DAYS  Final   Report Status PENDING  Incomplete      Radiology Studies: No results found.   Scheduled Meds: . sodium chloride   Intravenous Once  . DULoxetine  60 mg Oral QHS  . enoxaparin (LOVENOX) injection  40 mg Subcutaneous Q24H  . lactose free nutrition  237 mL Oral TID WC  . lidocaine  2 patch Transdermal Q24H  . methadone  5 mg Oral Q8H  . pantoprazole   40 mg Oral Daily  . sodium chloride flush  3 mL Intravenous Q12H  . timolol  1 drop Both Eyes QHS  . timolol  2 drop Both Eyes Daily   Continuous Infusions:    Marzetta Board, MD, PhD [facility] Pager [phone]  If 7PM-7AM, please contact night-coverage [email or web address] Password [email or web address] 01/05/2016, 10:05 AM

## 2016-01-05 NOTE — Progress Notes (Addendum)
Patient ID: Monique Russo, female   DOB: 1930-10-16, 82 y.o.   MRN: [id number]  [facility] Surgery Progress Note  15 Days Post-Op  Subjective: Less back pain than yesterday. Ate chicken noodle soup for dinner and eggs for breakfast. No n/v.  Objective: Vital signs in last 24 hours: Temp:  [98.1 F (36.7 C)-98.4 F (36.9 C)] 98.3 F (36.8 C) (10/18 0452) Pulse Rate:  [76-84] 84 (10/18 0452) Resp:  [18] 18 (10/18 0452) BP: (98-107)/(52-61) 101/52 (10/18 0452) SpO2:  [99 %-100 %] 99 % (10/18 0452) Last BM Date: 01/04/16  Intake/Output from previous day: 10/17 0701 - 10/18 0700 In: 1080 [P.O.:1080] Out: 520 [Urine:350; Drains:170] Intake/Output this shift: Total I/O In: 240 [P.O.:240] Out: 0   PE: Gen:  Alert, NAD, pleasant Pulm:  CTAB Abd: Soft, NT/ND, +BS, drain site cdiwith small amount serosanguinous/slightly cloudy drainage, midline incision pictured below      Lab Results:   Recent Labs  01/04/16 0428  WBC 7.7  HGB 9.1*  HCT 28.2*  PLT 458*   BMET  Recent Labs  01/04/16 0428  NA 135  K 3.6  CL 101  CO2 29  GLUCOSE 103*  BUN 6  CREATININE 0.61  CALCIUM 8.8*   PT/INR No results for input(s): LABPROT, INR in the last 72 hours. CMP     Component Value Date/Time   NA 135 01/04/2016 0428   K 3.6 01/04/2016 0428   CL 101 01/04/2016 0428   CO2 29 01/04/2016 0428   GLUCOSE 103 (H) 01/04/2016 0428   BUN 6 01/04/2016 0428   CREATININE 0.61 01/04/2016 0428   CALCIUM 8.8 (L) 01/04/2016 0428   PROT 5.5 (L) 01/04/2016 0428   ALBUMIN 1.7 (L) 01/04/2016 0428   AST 19 01/04/2016 0428   ALT 12 (L) 01/04/2016 0428   ALKPHOS 116 01/04/2016 0428   BILITOT 0.3 01/04/2016 0428   GFRNONAA >60 01/04/2016 0428   GFRAA >60 01/04/2016 0428   Lipase     Component Value Date/Time   LIPASE 12 (L) 08/06/2014 2020       Studies/Results: No results found.  Anti-infectives: Anti-infectives    Start     Dose/Rate Route Frequency Ordered Stop   12/30/15 1500  piperacillin-tazobactam (ZOSYN) IVPB 3.375 g  Status:  Discontinued     3.375 g 12.5 mL/hr over 240 Minutes Intravenous Every 8 hours 12/30/15 1405 01/03/16 1529   12/29/15 1300  piperacillin-tazobactam (ZOSYN) IVPB 3.375 g  Status:  Discontinued     3.375 g 12.5 mL/hr over 240 Minutes Intravenous Every 8 hours 12/29/15 1016 12/30/15 1032   12/25/15 1300  piperacillin-tazobactam (ZOSYN) IVPB 3.375 g  Status:  Discontinued     3.375 g 12.5 mL/hr over 240 Minutes Intravenous Every 8 hours 12/25/15 1218 12/29/15 1016   12/20/15 1000  ciprofloxacin (CIPRO) IVPB 400 mg  Status:  Discontinued     400 mg 200 mL/hr over 60 Minutes Intravenous 2 times daily 12/20/15 0907 12/25/15 1151   12/20/15 0945  metroNIDAZOLE (FLAGYL) IVPB 500 mg  Status:  Discontinued     500 mg 100 mL/hr over 60 Minutes Intravenous Every 8 hours 12/20/15 0907 12/25/15 1151   12/18/15 0430  ciprofloxacin (CIPRO) IVPB 400 mg  Status:  Discontinued     400 mg 200 mL/hr over 60 Minutes Intravenous 2 times daily 12/18/15 0427 12/20/15 0904   12/18/15 0430  metroNIDAZOLE (FLAGYL) IVPB 500 mg  Status:  Discontinued     500 mg 100 mL/hr over 60  Minutes Intravenous Every 8 hours 12/18/15 0427 12/20/15 0904   12/18/15 0345  piperacillin-tazobactam (ZOSYN) IVPB 3.375 g  Status:  Discontinued     3.375 g 100 mL/hr over 30 Minutes Intravenous  Once 12/18/15 0330 12/18/15 0427       Assessment/Plan S/p procedures: 1. Exploratory laparotomy. 2. Lysis of adhesions. 3. Removal of previously placed mesh. 4. Complex abdominal wall closure and hernia repair with biologic mesh (20 cm x 25 cm Phasix).-- 12/21/15 Dr. Ninfa Linden - POD#15 - RLQ drain 20cc/24hr >> drain pulled - Appreciate WOC recommendations: Mepitel to protect mesh,1 piece of white foam, 1 piece of black foam, suction to 113mm/hg pt tolerated without c/o pain - WBC 7.7, afebrile - having bowel function - continues to have minimal appetite but slowly  improving  Hyponatremia - resolved, 135 Hypokalemia - resolved, 3.6 Anemia - Hg 9.1 stable, continue to monitor PNA- completed course of zosyn per medicine Chronic pain - started back on methadone 10/8 GERD- protonix Hiatal hernia Malnourished - prealbumin 9.1 (01/01/16)  ID - 8 days cipro/flagyl completed, zosyn 10/7>>10/16 FEN - soft, Boost VTE - SCD, lovenox   Plan - Patient is stable and ready for discharge to a SNF. Drain pulled, staples removed, continue vac changes MWF. Continue to encourage increased PO intake and mobilization. PT. Ms. Downham will need follow-up with Dr. Ninfa Linden in 2 weeks.   LOS: 18 days    Jerrye Beavers , [facility] Surgery 01/05/2016, 8:54 AM Pager: [phone] Consults: [phone] Mon-Fri 7:00 am-4:30 pm Sat-Sun 7:00 am-11:30 am  Agree with above. Husband in room.  She has some tenderness in the right abdomen from where the right abdominal drain was removed.  I removed steristrips.    Hopefully home tomorrow to SNF.  Alphonsa Overall, MD, [facility] Surgery Pager: [phone] Office phone:  [phone]

## 2016-01-05 NOTE — Progress Notes (Addendum)
Daily Progress Note   Patient Name: Monique Russo       Date: 01/05/2016 DOB: 08-Jan-1931  Age: 82 y.o. MRN#: [id number] Attending Physician: Reyne Dumas, MD Primary Care Physician: Horatio Pel, MD Admit Date: 12/17/2015  Reason for Consultation/Follow-up: Establishing goals of care, Non pain symptom management and Pain control  Subjective: Evaluated patient. No family at bedside. Patient reports improvement in her back pain. States yesterday was "the worse pain I've ever had in my back". Encouraged patient to get out of bed with PT, even if just up to chair, but she replied she hasn't walked and has been in a bed for years and she likes it. Does not want to participate in therapy.   Review of Systems  Unable to perform ROS: Other  Constitutional: Positive for malaise/fatigue.       Anorexia  HENT: Negative.   Eyes: Negative.   Respiratory: Positive for shortness of breath.   Cardiovascular: Negative.   Gastrointestinal: Negative.   Genitourinary: Negative.   Musculoskeletal: Positive for back pain and myalgias.  Skin: Positive for itching.  Neurological: Negative.   Endo/Heme/Allergies: Negative.   Psychiatric/Behavioral: The patient has insomnia.     Length of Stay: 18  Current Medications: Scheduled Meds:  . sodium chloride   Intravenous Once  . DULoxetine  60 mg Oral QHS  . enoxaparin (LOVENOX) injection  40 mg Subcutaneous Q24H  . lactose free nutrition  237 mL Oral TID WC  . lidocaine  2 patch Transdermal Q24H  . magnesium sulfate 1 - 4 g bolus IVPB  2 g Intravenous Once  . methadone  5 mg Oral Q8H  . pantoprazole  40 mg Oral Daily  . sodium chloride flush  3 mL Intravenous Q12H  . timolol  1 drop Both Eyes QHS  . timolol  2 drop Both Eyes Daily     Continuous Infusions:    PRN Meds: sodium chloride, acetaminophen **OR** acetaminophen, alum & mag hydroxide-simeth, diphenhydrAMINE, diphenhydrAMINE-zinc acetate, HYDROmorphone (DILAUDID) injection, methocarbamol, ondansetron (ZOFRAN) IV, oxyCODONE, sodium chloride flush, sodium chloride flush, sodium chloride flush, temazepam  Physical Exam  Constitutional: She is oriented to person, place, and time. She appears well-developed. No distress.  Frail  HENT:  Head: Normocephalic.  Eyes: Conjunctivae and EOM are normal.  Cardiovascular: Normal rate and regular rhythm.  Pulmonary/Chest: Effort normal.  Diminished   Abdominal: Soft. There is tenderness.  Open surgical wound, dressing in place  Musculoskeletal: She exhibits edema (BLE) and tenderness.  Neurological: She is alert and oriented to person, place, and time.  Sleepy, arouseable  Skin: Skin is warm and dry.  Psychiatric:  Flat affect            Vital Signs: BP (!) 101/52 (BP Location: Left Wrist)   Pulse 84   Temp 98.3 F (36.8 C) (Oral)   Resp 18   Ht 5' (1.524 m)   Wt 89.6 kg (197 lb 8.5 oz)   SpO2 99%   BMI 38.58 kg/m  SpO2: SpO2: 99 % O2 Device: O2 Device: Nasal Cannula O2 Flow Rate: O2 Flow Rate (L/min): 4 L/min  Intake/output summary:   Intake/Output Summary (Last 24 hours) at 01/05/16 1132 Last data filed at 01/05/16 1053  Gross per 24 hour  Intake             1090 ml  Output              520 ml  Net              570 ml   LBM: Last BM Date: 01/04/16 Baseline Weight: Weight: 73 kg (161 lb) Most recent weight: Weight: 89.6 kg (197 lb 8.5 oz)       Palliative Assessment/Data: PPS: 30%   Flowsheet Rows   Flowsheet Row Most Recent Value  Intake Tab  Referral Department  Hospitalist  Unit at Time of Referral  Med/Surg Unit  Palliative Care Primary Diagnosis  Pain  Date Notified  12/29/15  Palliative Care Type  Return patient Palliative Care  Reason for referral  Pain  Date of Admission   12/17/15  # of days IP prior to Palliative referral  12  Clinical Assessment  Psychosocial & Spiritual Assessment  Palliative Care Outcomes      Patient Active Problem List   Diagnosis Date Noted  . Hypotension   . Difficulty in walking, not elsewhere classified   . Disorder of sleep wake schedule   . Protein-calorie malnutrition, severe ([place])   . End of life care   . Goals of care, counseling/discussion   . Palliative care by specialist   . Chronic bilateral low back pain without sciatica   . Small bowel obstruction 12/18/2015  . Diverticulitis of large intestine without perforation or abscess without bleeding 12/18/2015  . SBO (small bowel obstruction) 12/18/2015  . Left ventricular dysfunction  reported in chart, not confirmed on review of echo 11/28/2014  . Pulmonary arterial hypertension 11/28/2014  . Perforated chronic gastric ulcer ([place]) 08/11/2014  . Encounter for intubation   . Chronic pain syndrome   . Hyponatremia 07/17/2014  . Fatigue 07/09/2013  . DNR (do not resuscitate) 07/07/2013  . Palliative care encounter 07/07/2013  . Narcotic overdose 07/03/2013    Palliative Care Assessment & Plan   Patient Profile:  82 y.o. female  with past medical history of perforated bowel, small bowel obstruction, pulmonary arterial hypertension, chronic pain, chronic non healing abdominal surgical wound admitted on 12/17/2015 with abdominal pain and vomiting. Workup revealed Small bowel obstruction. She previously had surgery for perforated ulcer 1 year ago that was complicated by a non healing wound and resultant infection. She required surgery again during this hospitalization for exploratory lap, infected abdominal mesh and small bowel obstruction. Post operative has been complicated by ileus, poor nutritional status, watery diarrhea (C diff negative), pain, and  now with chest xray showing likely pneumonia. TPN discontinued per surgery d/t patient with working gut and able to have  po intake. She is now hypotensive with unknown etiology (?result of hypoalbuminemia?).   Assessment/Recommendations/Plan   Pain- improved with lidocaine patches. Encourage prn Tylenol vs narcotics d/t sedation, will start lidoderm patches. Getting out of bed with PT would be beneficial, but patient is refusing to participate.  Cachexia/Severe protein calorie malnutrition-  Albumin 1.7 (high indicator of mortality), she has no appetite- recommend considering adding adjuvant dexamethasone for pain relief and appetite stimulant (would rec start with 1mg  QD and titrate up) Will hold off for now to see how patient does with Tylenol and lidoderm patches.   Day/Night sleep cycle interruption- improved. She is oriented to time and sleeping at night.  Palliative medicine will continue to monitor and assist with symptoms as needed.  Noted plan for DC to SNF- recommend f/u with community Palliative services at facility to continue [place] discussion.   Goals of Care and Additional Recommendations:  Limitations on Scope of Treatment: Full Scope Treatment  Code Status:  DNR  Prognosis:   < 3 months d/t severe malnutrition, difficulty with wound healing and decreased functional status  Discharge Planning:  To Be Determined  Care plan was discussed with patient.  Thank you for allowing the Palliative Medicine Team to assist in the care of this patient.   Time In: 1000 Time Out: 1145 Total Time 45 mins Prolonged Time Billed No      Greater than 50%  of this time was spent counseling and coordinating care related to the above assessment and plan.  Mariana Kaufman, AGNP-C Palliative Medicine   Please contact Palliative Medicine Team phone at [phone] for questions and concerns.

## 2016-01-05 NOTE — Discharge Instructions (Signed)
[facility]      [facility] [phone]  OPEN ABDOMINAL SURGERY: POST OP INSTRUCTIONS  Always review your discharge instruction sheet given to you by the facility where your surgery was performed.  IF YOU HAVE DISABILITY OR FAMILY LEAVE FORMS, YOU MUST BRING THEM TO THE OFFICE FOR PROCESSING.  PLEASE DO NOT GIVE THEM TO YOUR DOCTOR.  1. A prescription for pain medication may be given to you upon discharge.  Take your pain medication as prescribed, if needed.  If narcotic pain medicine is not needed, then you may take acetaminophen (Tylenol) or ibuprofen (Advil) as needed. 2. Take your usually prescribed medications unless otherwise directed. 3. If you need a refill on your pain medication, please contact your pharmacy. They will contact our office to request authorization.  Prescriptions will not be filled after 5pm or on week-ends. 4. You should follow a light diet the first few days after arrival home, such as soup and crackers, pudding, etc.unless your doctor has advised otherwise. A high-fiber, low fat diet can be resumed as tolerated.   Be sure to include lots of fluids daily. Most patients will experience some swelling and bruising on the chest and neck area.  Ice packs will help.  Swelling and bruising can take several days to resolve 5. Most patients will experience some swelling and bruising in the area of the incision. Ice pack will help. Swelling and bruising can take several days to resolve..  6. It is common to experience some constipation if taking pain medication after surgery.  Increasing fluid intake and taking a stool softener will usually help or prevent this problem from occurring.  A mild laxative (Milk of Magnesia or Miralax) should be taken according to package directions if there are no bowel movements after 48 hours. 7.  You may have steri-strips (small skin tapes) in place directly over the incision.  These strips should be left on the skin for 7-10 days.  If your  surgeon used skin glue on the incision, you may shower in 24 hours.  The glue will flake off over the next 2-3 weeks.  Any sutures or staples will be removed at the office during your follow-up visit. You may find that a light gauze bandage over your incision may keep your staples from being rubbed or pulled. You may shower and replace the bandage daily. 8. ACTIVITIES:  You may resume regular (light) daily activities beginning the next day--such as daily self-care, walking, climbing stairs--gradually increasing activities as tolerated.  You may have sexual intercourse when it is comfortable.  Refrain from any heavy lifting or straining until approved by your doctor. a. You may drive when you no longer are taking prescription pain medication, you can comfortably wear a seatbelt, and you can safely maneuver your car and apply brakes b. Return to Work: ___________________________________ 9. You should see your doctor in the office for a follow-up appointment approximately two weeks after your surgery.  Make sure that you call for this appointment within a day or two after you arrive home to insure a convenient appointment time. OTHER INSTRUCTIONS:  _____________________________________________________________ _____________________________________________________________  WHEN TO CALL YOUR DOCTOR: 1. Fever over 101.0 2. Inability to urinate 3. Nausea and/or vomiting 4. Extreme swelling or bruising 5. Continued bleeding from incision. 6. Increased pain, redness, or drainage from the incision. 7. Difficulty swallowing or breathing 8. Muscle cramping or spasms. 9. Numbness or tingling in hands or feet or around lips.  The clinic staff is available to   answer your questions during regular business hours.  Please don't hesitate to call and ask to speak to one of the nurses if you have concerns.  For further questions, please visit [email or web address]   

## 2016-01-05 NOTE — Progress Notes (Signed)
Patient had large bowel movement on 10/17 and also had a BM on 10/16.

## 2016-01-05 NOTE — Progress Notes (Signed)
Pt complaining of 10/10 pain on RLQ and no relief with pain medication.  MD notified and went to examine pt.  Drain site cleansed with peroxide and gauze dressing applied per verbal order from Dr. Lucia Gaskins.  Will continue to monitor.  Monique Russo

## 2016-01-05 NOTE — Consult Note (Addendum)
WOC follow-up:  Reason for Consult: Vac dressing change to abd Wound type: Full thickness post-op wound to middle abd Measurement: 7x7.5x1.2, undermining present 12-12 measuring 4 cm.  Wound bed: Small amount of pink granulation tissue showing through mess, exposed tan-colored mesh with staples above and below open wound. PA present for assessment and removed staples and place steristrips. Drainage (amount, consistency, odor) Mod amt reddish drainage in the cannister Periwound: Intact skin surrounding, staples well-approximated/ removed per PA and placed steristrips.  Dressing procedure/placement/frequency: Mepitel to protect mesh,1 piece of white foam, 1 piece of black foam, suction to 147mm/hg pt tolerated without c/o pain. [place] team will provide Vac changes on M-W-F.  Lenard Simmer WOC student [address] MSN, RN, [place], [place], [place]

## 2016-01-05 NOTE — Evaluation (Signed)
Occupational Therapy Evaluation and Discharge Patient Details Name: Monique Russo MRN: [id number] DOB: Aug 14, 1930 Today's Date: 01/05/2016    History of Present Illness 82 y.o. female with a past medical history significant for chronic pain on methadone and chronic non-healing surgical wound who presented with abdominal pain and vomiting for 3 days. Now s/p ExLap, LOA, removal of prior mesh and complex abdominal wall closure and hernia repair with biologic mesh on 10/3.   Clinical Impression   Pt is pleasant, uncooperative for ANY mobility with OT she states that "it hurts too bad" and that "I don't want no therapy anymore", she refuses any and all movement now or in the future. (See ADL section for further details) Role of OT explained and while Pt agreeable to bed level ADL, Pt not agreeable to working with therapy to improve independence "I'm old. I been like this for a long time" explained that working with therapy will help her get home to no avail.  OT will sign off (second time); no further needs at this time; Pt perseverates on her pain in her back and meds--had all pain  meds prior to this re-eval and pt still uncooperative. Pt reports she takes much more pain medication at home when she is "in control of it"    Follow Up Recommendations  SNF    Equipment Recommendations  None recommended by OT    Recommendations for Other Services       Precautions / Restrictions Precautions Precautions: Fall Precaution Comments: wound VAC Restrictions Weight Bearing Restrictions: No      Mobility Bed Mobility Overal bed mobility: Needs Assistance;+2 for physical assistance Bed Mobility:  (Pt refused bed mobility with OT "My back hurts too much")           General bed mobility comments: attempted, pt adamantly refused  Transfers                 General transfer comment: No transferred attempted. Pt adamently refused    Balance                                            ADL Overall ADL's : Needs assistance/impaired Eating/Feeding: Set up;Bed level   Grooming: Oral care;Wash/dry face;Set up;Bed level Grooming Details (indicate cue type and reason): with HOB elevated, Pt brushed teeth with cup method and washed face Upper Body Bathing: Maximal assistance;Bed level   Lower Body Bathing: Maximal assistance;+2 for physical assistance;Bed level   Upper Body Dressing : Maximal assistance;Bed level   Lower Body Dressing: Maximal assistance;+2 for physical assistance;Bed level               Functional mobility during ADLs:  (Pt adamently refusing bed mobility or sitting EOB with OT) General ADL Comments: Pt at bed level ADL, and refusing any bed mobility or sitting EOB for ADL "my back hurts too much, there is no way I will do it" Pt educated on importance of mobility, and sitting up to which Pt replied "honey, even at my house I cannot get out of bed to see my visitors" OT attempted with music, encouragement x3 and Pt refused. Pt did enjoy/perform bed level ADL.     Vision     Perception     Praxis      Pertinent Vitals/Pain Pain Assessment: 0-10 Pain Score: 10-Worst pain ever Faces Pain Scale: Hurts little  more Pain Location: back, and where staples came out Pain Descriptors / Indicators: Burning;Constant Pain Intervention(s): Limited activity within patient's tolerance;Premedicated before session     Hand Dominance Right   Extremity/Trunk Assessment Upper Extremity Assessment Upper Extremity Assessment: Generalized weakness   Lower Extremity Assessment Lower Extremity Assessment: Defer to PT evaluation   Cervical / Trunk Assessment Cervical / Trunk Assessment: Other exceptions Cervical / Trunk Exceptions: h/o multiple lumbar surgeries and chronic low back pain, she is not currently on her normal pain med routine here in the hospital.    Communication Communication Communication: No difficulties   Cognition  Arousal/Alertness: Awake/alert Behavior During Therapy: Anxious Overall Cognitive Status: Within Functional Limits for tasks assessed                     General Comments       Exercises       Shoulder Instructions      Home Living Family/patient expects to be discharged to:: Skilled nursing facility ("wants to go home")                                 Additional Comments: Pt states that she is hopeful for new SNF, has not been happy with previous SNF stay at other facilities      Prior Functioning/Environment Level of Independence: Needs assistance  Gait / Transfers Assistance Needed: power chair ar baseline. lateral scooting for transfers. Does not stand at baseline, does sit EOB  (from OT eval).  per daughter she can transfer into her electric chair, can transfer to the toilet byherself and can preform peri care by herself at baseline.  Husband has to help her down the one step and immediately in the car when entering and exiting her home, but he can literally pull up to the door and she only has to take one or two assisted steps to the car. (info per previous notes from 1 wk ago and per discussion with pt) ADL's / Homemaking Assistance Needed: aide assists with bathing/dressing            OT Problem List: Decreased strength;Decreased range of motion;Decreased activity tolerance;Decreased knowledge of use of DME or AE;Obesity;Pain   OT Treatment/Interventions: Self-care/ADL training;DME and/or AE instruction;Therapeutic activities;Patient/family education;Balance training    OT Goals(Current goals can be found in the care plan section) Acute Rehab OT Goals Patient Stated Goal: to get home OT Goal Formulation: With patient Time For Goal Achievement: 01/12/16 Potential to Achieve Goals: Fair  OT Frequency: Other (comment) (Discharge from OT)   Barriers to D/C:            Co-evaluation              End of Session Equipment Utilized During  Treatment: Oxygen Nurse Communication: Mobility status  Activity Tolerance: Patient limited by pain Patient left: in bed;with call bell/phone within reach;with bed alarm set   Time: [id number] OT Time Calculation (min): 15 min Charges:  OT General Charges $OT Visit: 1 Procedure OT Evaluation $OT Eval Moderate Complexity: 1 Procedure G-Codes:    Merri Ray Edelyn Heidel 02/02/2016, 10:45 AM Hulda Humphrey OTR/L [phone]

## 2016-01-06 DIAGNOSIS — E43 Unspecified severe protein-calorie malnutrition: Secondary | ICD-10-CM | POA: Diagnosis not present

## 2016-01-06 DIAGNOSIS — G4720 Circadian rhythm sleep disorder, unspecified type: Secondary | ICD-10-CM | POA: Diagnosis not present

## 2016-01-06 DIAGNOSIS — G894 Chronic pain syndrome: Secondary | ICD-10-CM | POA: Diagnosis not present

## 2016-01-06 DIAGNOSIS — R1311 Dysphagia, oral phase: Secondary | ICD-10-CM | POA: Diagnosis not present

## 2016-01-06 DIAGNOSIS — M545 Low back pain: Secondary | ICD-10-CM

## 2016-01-06 DIAGNOSIS — K56609 Unspecified intestinal obstruction, unspecified as to partial versus complete obstruction: Secondary | ICD-10-CM | POA: Diagnosis not present

## 2016-01-06 DIAGNOSIS — G8929 Other chronic pain: Secondary | ICD-10-CM

## 2016-01-06 DIAGNOSIS — K5649 Other impaction of intestine: Secondary | ICD-10-CM | POA: Diagnosis not present

## 2016-01-06 DIAGNOSIS — M6281 Muscle weakness (generalized): Secondary | ICD-10-CM | POA: Diagnosis not present

## 2016-01-06 DIAGNOSIS — K219 Gastro-esophageal reflux disease without esophagitis: Secondary | ICD-10-CM | POA: Diagnosis not present

## 2016-01-06 DIAGNOSIS — I959 Hypotension, unspecified: Secondary | ICD-10-CM | POA: Diagnosis not present

## 2016-01-06 DIAGNOSIS — Z48815 Encounter for surgical aftercare following surgery on the digestive system: Secondary | ICD-10-CM | POA: Diagnosis not present

## 2016-01-06 LAB — COMPREHENSIVE METABOLIC PANEL
ALBUMIN: 1.7 g/dL — AB (ref 3.5–5.0)
ALT: 20 U/L (ref 14–54)
AST: 31 U/L (ref 15–41)
Alkaline Phosphatase: 102 U/L (ref 38–126)
Anion gap: 6 (ref 5–15)
BUN: 6 mg/dL (ref 6–20)
CHLORIDE: 98 mmol/L — AB (ref 101–111)
CO2: 30 mmol/L (ref 22–32)
CREATININE: 0.57 mg/dL (ref 0.44–1.00)
Calcium: 9.1 mg/dL (ref 8.9–10.3)
GFR calc non Af Amer: >60 mL/min (ref >60)
Glucose, Bld: 123 mg/dL — ABNORMAL HIGH (ref 65–99)
Potassium: 3.9 mmol/L (ref 3.5–5.1)
SODIUM: 134 mmol/L — AB (ref 135–145)
Total Bilirubin: 0.3 mg/dL (ref 0.3–1.2)
Total Protein: 5.8 g/dL — ABNORMAL LOW (ref 6.5–8.1)

## 2016-01-06 LAB — CBC
HCT: 28.9 % — ABNORMAL LOW (ref 36.0–46.0)
Hemoglobin: 9.1 g/dL — ABNORMAL LOW (ref 12.0–15.0)
MCH: 29.8 pg (ref 26.0–34.0)
MCHC: 31.5 g/dL (ref 30.0–36.0)
MCV: 94.8 fL (ref 78.0–100.0)
PLATELETS: 469 10*3/uL — AB (ref 150–400)
RBC: 3.05 MIL/uL — AB (ref 3.87–5.11)
RDW: 14.5 % (ref 11.5–15.5)
WBC: 11.7 10*3/uL — ABNORMAL HIGH (ref 4.0–10.5)

## 2016-01-06 LAB — MAGNESIUM: Magnesium: 1.9 mg/dL (ref 1.7–2.4)

## 2016-01-06 MED ORDER — ALUM & MAG HYDROXIDE-SIMETH 200-200-20 MG/5ML PO SUSP: 15.0000 mL | ORAL | 0 refills | Status: DC | PRN

## 2016-01-06 NOTE — Clinical Social Work Note (Signed)
Insurance authorization number: [id number]  Sela Hilding, [place]

## 2016-01-06 NOTE — Progress Notes (Addendum)
Patient ID: Monique Russo, female   DOB: [birth year], 82 y.o.   MRN: [id number]  [facility] Surgery Progress Note  16 Days Post-Op  Subjective: Anxious about going to SNF, concerned that she will not be taken care of.  Worked with PT yesterday. Still with suppressed appetite but tolerating small amounts of diet. Had few scrambled eggs and toast for breakfast. Drinking Boost TID.  Objective: Vital signs in last 24 hours: Temp:  [97.4 F (36.3 C)-99.3 F (37.4 C)] 99.3 F (37.4 C) (10/19 0428) Pulse Rate:  [78-84] 81 (10/19 0428) Resp:  [17-18] 17 (10/19 0428) BP: (95-108)/(48-60) 108/48 (10/19 0428) SpO2:  [98 %-99 %] 98 % (10/19 0428) Last BM Date: 01/05/16  Intake/Output from previous day: 10/18 0701 - 10/19 0700 In: 960 [P.O.:940; I.V.:20] Out: 0  Intake/Output this shift: No intake/output data recorded.  PE: Gen: Alert, NAD, pleasant Pulm: CTAB Abd: Soft, NT/ND, +BS, drain site cdiwith trace serous drainage, midline incision with wound vac in place and steris over remaining part of incision (healing well, no erythema or drainage)  Lab Results:   Recent Labs  01/04/16 0428 01/06/16 0500  WBC 7.7 11.7*  HGB 9.1* 9.1*  HCT 28.2* 28.9*  PLT 458* 469*   BMET  Recent Labs  01/04/16 0428 01/06/16 0500  NA 135 134*  K 3.6 3.9  CL 101 98*  CO2 29 30  GLUCOSE 103* 123*  BUN 6 6  CREATININE 0.61 0.57  CALCIUM 8.8* 9.1   PT/INR No results for input(s): LABPROT, INR in the last 72 hours. CMP     Component Value Date/Time   NA 134 (L) 01/06/2016 0500   K 3.9 01/06/2016 0500   CL 98 (L) 01/06/2016 0500   CO2 30 01/06/2016 0500   GLUCOSE 123 (H) 01/06/2016 0500   BUN 6 01/06/2016 0500   CREATININE 0.57 01/06/2016 0500   CALCIUM 9.1 01/06/2016 0500   PROT 5.8 (L) 01/06/2016 0500   ALBUMIN 1.7 (L) 01/06/2016 0500   AST 31 01/06/2016 0500   ALT 20 01/06/2016 0500   ALKPHOS 102 01/06/2016 0500   BILITOT 0.3 01/06/2016 0500   GFRNONAA >60  01/06/2016 0500   GFRAA >60 01/06/2016 0500   Lipase     Component Value Date/Time   LIPASE 12 (L) 08/06/2014 2020       Studies/Results: No results found.  Anti-infectives: Anti-infectives    Start     Dose/Rate Route Frequency Ordered Stop   12/30/15 1500  piperacillin-tazobactam (ZOSYN) IVPB 3.375 g  Status:  Discontinued     3.375 g 12.5 mL/hr over 240 Minutes Intravenous Every 8 hours 12/30/15 1405 01/03/16 1529   12/29/15 1300  piperacillin-tazobactam (ZOSYN) IVPB 3.375 g  Status:  Discontinued     3.375 g 12.5 mL/hr over 240 Minutes Intravenous Every 8 hours 12/29/15 1016 12/30/15 1032   12/25/15 1300  piperacillin-tazobactam (ZOSYN) IVPB 3.375 g  Status:  Discontinued     3.375 g 12.5 mL/hr over 240 Minutes Intravenous Every 8 hours 12/25/15 1218 12/29/15 1016   12/20/15 1000  ciprofloxacin (CIPRO) IVPB 400 mg  Status:  Discontinued     400 mg 200 mL/hr over 60 Minutes Intravenous 2 times daily 12/20/15 0907 12/25/15 1151   12/20/15 0945  metroNIDAZOLE (FLAGYL) IVPB 500 mg  Status:  Discontinued     500 mg 100 mL/hr over 60 Minutes Intravenous Every 8 hours 12/20/15 0907 12/25/15 1151   12/18/15 0430  ciprofloxacin (CIPRO) IVPB 400 mg  Status:  Discontinued     400 mg 200 mL/hr over 60 Minutes Intravenous 2 times daily 12/18/15 0427 12/20/15 0904   12/18/15 0430  metroNIDAZOLE (FLAGYL) IVPB 500 mg  Status:  Discontinued     500 mg 100 mL/hr over 60 Minutes Intravenous Every 8 hours 12/18/15 0427 12/20/15 0904   12/18/15 0345  piperacillin-tazobactam (ZOSYN) IVPB 3.375 g  Status:  Discontinued     3.375 g 100 mL/hr over 30 Minutes Intravenous  Once 12/18/15 0330 12/18/15 0427       Assessment/Plan S/p procedures: 1. Exploratory laparotomy. 2. Lysis of adhesions. 3. Removal of previously placed mesh. 4. Complex abdominal wall closure and hernia repair with biologic mesh (20 cm x 25 cm Phasix).-- 12/21/15 Dr. Ninfa Linden - POD#16 - RLQ drain pulled yesterday,  trace serous drainage from site today - Appreciate WOC recommendations: Mepitel to protect mesh,1 piece of white foam, 1 piece of black foam, suction to 164mm/hg pt tolerated without c/o pain - WBC 11.7,afebrile - having bowel function - appetite suppressed but tolerating small amounts of diet  Hyponatremia - 134 Hypokalemia - resolved, 3.6 Anemia - Hg 9.1 stable, continue to monitor PNA- completed course of zosyn per medicine Chronic pain - started back on methadone 10/8 GERD- protonix Hiatal hernia Malnourished - prealbumin 9.1 (01/01/16)  ID - 8 days cipro/flagyl completed, zosyn 10/7>>10/16 FEN - soft, Boost VTE - SCD, lovenox   Plan - awaiting placement in SNF, will hopefully be able to go today.   Continue to encourage PO intake, mobilization, PT.   Drain pulled yesterday, continue daily dry dressing changes.   Continue vac changes MWF.   Monique Russo will need follow-up with Dr. Ninfa Linden in 2 weeks.   LOS: 19 days    [facility] 01/06/2016, 10:15 AM Pager: [phone] Consults: [phone] Mon-Fri 7:00 am-4:30 pm Sat-Sun 7:00 am-11:30 am   Agree with above. Russo better. Right sided abdomen pain has subsided. I think she will do okay at SNF.  Monique Overall, MD, [facility] Pager: [phone] Office phone:  [phone]

## 2016-01-06 NOTE — Progress Notes (Signed)
Pt d/c'd via [facility] transport to [facility]. Pt d/c'd with her belongings, unit charge nurse changed abd dsg to NS wet to dry dsg and [facility] is having wound VAC delivered to their facility tomorrow and will resume VAC to pt's abd wound.  Pt's husband at bedside when pt left.  Report called to receiving nurse at [facility] at 1625 (pt had not yet arrived at facility). Details about wound VAC and current dsg given to nurse.

## 2016-01-06 NOTE — Clinical Social Work Note (Signed)
Clinical Social Worker facilitated patient discharge including contacting patient family and facility to confirm patient discharge plans.  Clinical information faxed to facility and family agreeable with plan.  CSW arranged ambulance transport via [facility] to [facility]. RN to call report prior to discharge.  Clinical Social Worker will sign off for now as social work intervention is no longer needed. Please consult [place] again if new need arises.  [address] [place]

## 2016-01-06 NOTE — NC FL2 (Signed)
[place] LEVEL OF CARE SCREENING TOOL     IDENTIFICATION  Patient Name: Monique Russo Birthdate: December 16, 1930 Sex: female Admission Date (Current Location): 12/17/2015  [facility] and [place] Number:  Herbalist and Address:  [facility] [place], [place] [address], [place], [place] [address]      Provider Number: [id number]  Attending Physician Name and Address:  Doreatha Lew, MD  Relative Name and Phone Number:       Current Level of Care: Hospital Recommended Level of Care: [place] Prior Approval Number:    Date Approved/Denied:   PASRR Number:   [id number] Discharge Plan: SNF    Current Diagnoses: Patient Active Problem List   Diagnosis Date Noted  . Hypotension   . Difficulty in walking, not elsewhere classified   . Disorder of sleep wake schedule   . Protein-calorie malnutrition, severe ([place])   . End of life care   . Goals of care, counseling/discussion   . Palliative care by specialist   . Chronic bilateral low back pain without sciatica   . Small bowel obstruction 12/18/2015  . Diverticulitis of large intestine without perforation or abscess without bleeding 12/18/2015  . SBO (small bowel obstruction) 12/18/2015  . Left ventricular dysfunction  reported in chart, not confirmed on review of echo 11/28/2014  . Pulmonary arterial hypertension 11/28/2014  . Perforated chronic gastric ulcer ([place]) 08/11/2014  . Encounter for intubation   . Chronic pain syndrome   . Hyponatremia 07/17/2014  . Fatigue 07/09/2013  . DNR (do not resuscitate) 07/07/2013  . Palliative care encounter 07/07/2013  . Narcotic overdose 07/03/2013    Orientation RESPIRATION BLADDER Height & Weight     Self, Situation  Normal Continent Weight: 197 lb 8.5 oz (89.6 kg) Height:  5' (152.4 cm)  BEHAVIORAL SYMPTOMS/MOOD NEUROLOGICAL BOWEL NUTRITION STATUS      Incontinent    AMBULATORY STATUS COMMUNICATION OF NEEDS Skin    Extensive Assist Verbally Normal                       Personal Care Assistance Level of Assistance  Bathing, Dressing Bathing Assistance: Maximum assistance   Dressing Assistance: Maximum assistance     Functional Limitations Info             SPECIAL CARE FACTORS FREQUENCY                       Contractures      Additional Factors Info   (Full)               Current Medications (01/06/2016):  This is the current hospital active medication list Current Facility-Administered Medications  Medication Dose Route Frequency Provider Last Rate Last Dose  . 0.9 %  sodium chloride infusion  250 mL Intravenous PRN Earlie Counts, NP 10 mL/hr at 01/05/16 1045 250 mL at 01/05/16 1045  . 0.9 %  sodium chloride infusion   Intravenous Once Reyne Dumas, MD      . acetaminophen (TYLENOL) tablet 650 mg  650 mg Oral Q6H PRN Earlie Counts, NP   650 mg at 01/04/16 1211   Or  . acetaminophen (TYLENOL) suppository 650 mg  650 mg Rectal Q6H PRN Earlie Counts, NP      . alum & mag hydroxide-simeth (MAALOX/MYLANTA) 200-200-20 MG/5ML suspension 15 mL  15 mL Oral PRN Alphonsa Overall, MD   15 mL at 01/03/16 2051  .  diphenhydrAMINE (BENADRYL) capsule 25 mg  25 mg Oral Q4H PRN Jeryl Columbia, NP   25 mg at 01/03/16 2309  . diphenhydrAMINE-zinc acetate (BENADRYL) 2-0.1 % cream   Topical TID PRN Jeryl Columbia, NP   1 application at [id number]  . DULoxetine (CYMBALTA) DR capsule 60 mg  60 mg Oral QHS Reyne Dumas, MD   60 mg at 01/05/16 2101  . enoxaparin (LOVENOX) injection 40 mg  40 mg Subcutaneous Q24H Earlie Counts, NP   40 mg at 01/05/16 1537  . HYDROmorphone (DILAUDID) injection 0.5 mg  0.5 mg Intravenous Q4H PRN Reyne Dumas, MD   0.5 mg at 01/05/16 1714  . lactose free nutrition (BOOST PLUS) liquid 237 mL  237 mL Oral TID WC Darci Current [place]aan, PA-C   237 mL at 01/05/16 1849  . lidocaine (LIDODERM) 5 % 2 patch  2 patch Transdermal Q24H Earlie Counts, NP   2 patch at  01/05/16 1206  . methadone (DOLOPHINE) tablet 5 mg  5 mg Oral Q8H Reyne Dumas, MD   5 mg at 01/06/16 0159  . methocarbamol (ROBAXIN) tablet 500 mg  500 mg Oral Q8H PRN Reyne Dumas, MD   500 mg at 01/05/16 1533  . ondansetron (ZOFRAN) injection 4 mg  4 mg Intravenous Q6H PRN Edwin Dada, MD   4 mg at 01/03/16 1902  . oxyCODONE (Oxy IR/ROXICODONE) immediate release tablet 5 mg  5 mg Oral Q6H PRN Reyne Dumas, MD   5 mg at 01/05/16 1533  . pantoprazole (PROTONIX) EC tablet 40 mg  40 mg Oral Daily Fanny Skates, MD   40 mg at 01/05/16 1035  . sodium chloride flush (NS) 0.9 % injection 10 mL  10 mL Intravenous PRN Tyrone Apple, RPH   10 mL at 01/05/16 1053  . sodium chloride flush (NS) 0.9 % injection 10-40 mL  10-40 mL Intracatheter PRN Caren Griffins, MD   10 mL at 01/06/16 0602  . sodium chloride flush (NS) 0.9 % injection 3 mL  3 mL Intravenous Q12H Earlie Counts, NP   3 mL at 01/05/16 1000  . sodium chloride flush (NS) 0.9 % injection 3 mL  3 mL Intravenous PRN Earlie Counts, NP      . temazepam (RESTORIL) capsule 30 mg  30 mg Oral QHS PRN Reyne Dumas, MD   30 mg at 01/05/16 2101  . timolol (TIMOPTIC) 0.5 % ophthalmic solution 1 drop  1 drop Both Eyes QHS Ripudeep K Rai, MD   1 drop at 01/05/16 2103  . timolol (TIMOPTIC) 0.5 % ophthalmic solution 2 drop  2 drop Both Eyes Daily Ripudeep Krystal Eaton, MD   2 drop at 01/05/16 1036     Discharge Medications: Please see discharge summary for a list of discharge medications.  Relevant Imaging Results:  Relevant Lab Results:   Additional [place], [place]

## 2016-01-06 NOTE — Consult Note (Signed)
   [facility] CM Inpatient Consult   01/06/2016  Monique Russo 1931/02/10 [id number]    Patient screened for potential [facility] Care Management services. Chart reviewed. Noted current discharge plan is for SNF. Confirmed disposition plans with inpatient RNCM. There are no identifiable [facility] Care Management needs at this time. If patient's post hospital needs change, please place a [facility] Care Management consult. For questions please contact:  Marthenia Rolling, [place], RN,BSN [facility] Liaison [phone]

## 2016-01-06 NOTE — Clinical Social Work Placement (Signed)
   CLINICAL SOCIAL WORK PLACEMENT  NOTE  Date:  01/06/2016  Patient Details  Name: Monique Russo MRN: [id number] Date of Birth: 12-02-1930  Clinical Social Work is seeking post-discharge placement for this patient at the [place] level of care (*CSW will initial, date and re-position this form in  chart as items are completed):  Yes   Patient/family provided with [place] Work Department's list of facilities offering this level of care within the geographic area requested by the patient (or if unable, by the patient's family).  Yes   Patient/family informed of their freedom to choose among providers that offer the needed level of care, that participate in Medicare, Medicaid or managed care program needed by the patient, have an available bed and are willing to accept the patient.      Patient/family informed of 's ownership interest in [facility] and [facility], as well as of the fact that they are under no obligation to receive care at these facilities.  PASRR submitted to EDS on       PASRR number received on       Existing PASRR number confirmed on 01/06/16     FL2 transmitted to all facilities in geographic area requested by pt/family on 01/06/16     FL2 transmitted to all facilities within larger geographic area on       Patient informed that his/her managed care company has contracts with or will negotiate with certain facilities, including the following:        Yes   Patient/family informed of bed offers received.  Patient chooses bed at [facility]     Physician recommends and patient chooses bed at      Patient to be transferred to [facility] on 01/06/16.  Patient to be transferred to facility by [facility]     Patient family notified on 01/06/16 of transfer.  Name of family member notified:  Volanda Hallak, [phone]     PHYSICIAN Please prepare priority discharge summary,  including medications, Please prepare prescriptions, Please sign FL2     Additional Comment:  Clinical Social Worker facilitated patient discharge including contacting patient family and facility to confirm patient discharge plans.  Clinical information faxed to facility and family agreeable with plan.  CSW arranged ambulance transport via [facility] to [facility] .  RN to call report prior to discharge.  Clinical Social Worker will sign off for now as social work intervention is no longer needed. Please consult [place] again if new need arises.  Sela Hilding, [place]  _______________________________________________ Alla German, LCSW 01/06/2016, 10:52 AM

## 2016-01-06 NOTE — Discharge Summary (Addendum)
Physician Discharge Summary  Monique Russo  MWU:[id number]  DOB: 10-Apr-1930  DOA: 12/17/2015 PCP: Horatio Pel, MD  Admit date: 12/17/2015 Discharge date: 01/06/2016  Admitted From: Home Disposition:  SNF [facility]  Recommendations for Outpatient Follow-up:  1. Follow up with PCP in 1-2 weeks 2. Please obtain BMP/CBC in one week 3. Please follow up on the following pending results:  Home Health: None  Equipment/Devices: None  Discharge Condition: Guarded CODE STATUS: DNR Diet recommendation: Heart Healthy   Brief/Interim Summary:  Monique Russo is a 82 y.o. female with a past medical history significant for chronic pain on methadone and chronic non-healing surgical wound who presents with abdominal pain and vomiting for 3 days.  The patient had a perforated ulcer 1 year ago requiring [name] patch. This surgery was unfortunately complicated by nonhealing surgical wound (because of 82 year old infected hernia repair mesh?), Which for the last year the patient has been having wound care by her general surgeon in the office every six weeks.Two days prior to admission, the patient had an office debridement and described that Dr. Excell Seltzer did his usual debridement, after which she began to develop nausea and LLQ abdominal pain. This crampy, severe, LLQ aching abdominal pain continued intermittently and was associated with several episodes of nonbloody emesis and obstipation.   On the morning of admission, she was recommended by surgery office for ER evaluation. CT of the abdomen and pelvis was obtained that showed small bowel obstruction with transition point in the right mid abdomenand stranding around the colon, consistent with diverticulitis. Surgery was consulted and patient was admitted for further workup. Patient's condition did not improve and she underwent ExLap, LOA, removal of prior mesh and complex abdominal wall closure and hernia repair with biologic mesh on  10/3. Postoperative course was initially complicated by mild ileus, and patient needed to be started on TPN, however she is improving now, and her diet is advanced but she is eating poorly, not meeting her daily calorie count. On 10/9, patient started developing significant watery diarrhea requiring flexi-seal which has now been removed. C diff is checked and returned negative.  Subjective:  Concern about SNF, patient reported she had not experienced with nursing homes. Patient appetite has improved, tolerating diet well. Denies nausea, vomiting, diarrhea, chest pain, dizziness, and shortness breath. Patient remains complaining of abdominal pain at the surgical site. Patient has been cleared by surgery for discharge to SNF with wound care.    Discharge Diagnoses:   Small bowel obstruction - Abdominal x-ray 10/3 showed persistent high-grade small bowel obstruction, complicated surgical history, very poor nutritional status. However patient is having daily bowel movements -General surgery, following, patient underwent ExLap, LOA, removal of prior mesh and complex abdominal wall closure and hernia repair with biologic mesh on 10/3. [facility] team will provide Vac changes on M-W-F. Discharge to SNF with wound VAC -Surgical team to make further recommendations regarding wound care, Patient need to follow up in 2 weeks, expected for wound healing given extremely poor nutritional status  Hypotension multifactorial, blood pressure still borderline low could be secondary to poor oral intake, underlying infection, minimize narcotics Minimize Ativan, Does not appear to be septic, blood culture from 10/13 no growth  Likely from poor oral intake  Hypomagnesemia/Hyponatremia- Resolved  Need recheck BMP and Mag in 1 week   She does not have an appetite, leading to multiple electrolyte abnormalities  Wound care-Vac dressing change to abd Dressing procedure/placement/frequency: Mepitel to protect mesh,1 piece of  white foam, 1 piece  of black foam, suction to 172m/hg pt tolerated without c/o pain. WPetalumateam will provide Vac changes on M-W-F Continue daily dry dressing changes  Diarrhea-resolved - Patient developed significant amounts of watery diarrhea on 10/9, requiring a Flexi Seal placement, C. difficile  Negative,    Severe protein calorie malnutrition - albumin 1.7, slow to progress, surgical prognosis/wound healing appears to be poor  Diverticulitis - Resolved   Aspiration PNA vs HCAP,  - SLP evaluation ordered, discussed at length with daughter, she understands aspiration risk - CXR with developing PNA, last chest x-ray was done on 10/7   tracking wean down oxygen, still on 4 L  Chronic pain syndrome On methadone, Minimize narcotics like oxycodone and Dilaudid, patient already on scheduled methadone - QTC 450, continue methadone at home dose without tapering Now has acute on chronic back pain, patient not ambulating  GERD and hiatal hernia Continue PPI - DG barium esophagus showed marked lower esophageal tortuosity with large hiatal hernia but no mass. For definitive imaging, patient will need EGD but this can be done at a later date.  That she will need close outpatient gastroenterology follow-up once her acute illness is resolved  Anemia of chronic disease  Hg dropped from 13.9>7.2, Corrected appropriately after 2 units, hemoglobin now 9.1 Recommended Iron therapy     Discharge Instructions  Discharge Instructions    Call MD for:  difficulty breathing, headache or visual disturbances    Complete by:  As directed    Call MD for:  extreme fatigue    Complete by:  As directed    Call MD for:  hives    Complete by:  As directed    Call MD for:  persistant dizziness or light-headedness    Complete by:  As directed    Call MD for:  persistant nausea and vomiting    Complete by:  As directed    Call MD for:  redness, tenderness, or signs of infection (pain, swelling,  redness, odor or green/yellow discharge around incision site)    Complete by:  As directed    Call MD for:  severe uncontrolled pain    Complete by:  As directed    Call MD for:  temperature >100.4    Complete by:  As directed    Change dressing    Complete by:  As directed    Diet - low sodium heart healthy    Complete by:  As directed    Discharge instructions    Complete by:  As directed    Increase activity slowly    Complete by:  As directed        Medication List    STOP taking these medications   amoxicillin-clavulanate 875-125 MG tablet Commonly known as:  AUGMENTIN   temazepam 30 MG capsule Commonly known as:  RESTORIL     TAKE these medications   acetaminophen 325 MG tablet Commonly known as:  TYLENOL Take 325 mg by mouth every 6 (six) hours as needed for mild pain or headache.   alum & mag hydroxide-simeth 200-200-20 MG/5ML suspension Commonly known as:  MAALOX/MYLANTA Take 15 mLs by mouth as needed for indigestion or heartburn.   b complex vitamins tablet Take 1 tablet by mouth daily as needed.   bisacodyl 10 MG suppository Commonly known as:  DULCOLAX Place 1 suppository (10 mg total) rectally daily as needed for moderate constipation (May repeat times one).   budesonide 32 MCG/ACT nasal spray Commonly known as:  RHINOCORT AQUA  Place 1 spray into both nostrils daily as needed for rhinitis.   cholecalciferol 1000 units tablet Commonly known as:  VITAMIN D Take 1,000 Units by mouth daily.   diclofenac sodium 1 % Gel Commonly known as:  VOLTAREN Apply topically every three (3) days as needed (For Leg pain). 1 gm   DULoxetine 60 MG capsule Commonly known as:  CYMBALTA Take 60 mg by mouth daily. At bedtime   Fish Oil 1000 MG Cpdr Take 1,000 mg by mouth daily.   furosemide 80 MG tablet Commonly known as:  LASIX Take 80 mg by mouth daily as needed for fluid. What changed:  Another medication with the same name was removed. Continue taking this  medication, and follow the directions you see here.   gabapentin 800 MG tablet Commonly known as:  NEURONTIN Take 800 mg by mouth 2 (two) times daily.   lactose free nutrition Liqd Take 237 mLs by mouth 2 (two) times daily between meals.   LORazepam 1 MG tablet Commonly known as:  ATIVAN Take 1 tablet (1 mg total) by mouth every 4 (four) hours as needed for anxiety.   Magnesium 400 MG Tabs Take 400 mg by mouth daily.   magnesium hydroxide 400 MG/5ML suspension Commonly known as:  MILK OF MAGNESIA Take 30 mLs by mouth every other day. As needed for constipation   methadone 5 MG tablet Commonly known as:  DOLOPHINE Take 5 mg by mouth every 8 (eight) hours.   methocarbamol 750 MG tablet Commonly known as:  ROBAXIN Take 750 mg by mouth every 8 (eight) hours as needed for muscle spasms (Prescribed TID but patient takes PRN).   morphine CONCENTRATE 10 MG/0.5ML Soln concentrated solution Take 0.25 mLs (5 mg total) by mouth every 2 (two) hours as needed for severe pain.   multivitamins with iron Tabs tablet Take 1 tablet by mouth daily with supper.   NEXIUM 24HR PO Take 22.3 mg by mouth daily.   oxyCODONE 15 MG immediate release tablet Commonly known as:  ROXICODONE Take 15 mg by mouth every 8 (eight) hours as needed for pain.   potassium chloride SA 20 MEQ tablet Commonly known as:  K-DUR,KLOR-CON Take 20 mEq by mouth daily as needed (Only takes with lasix).   timolol 0.5 % ophthalmic solution Commonly known as:  BETIMOL Place 1 drop into both eyes 2 (two) times daily.   vitamin C 1000 MG tablet Take 1,000 mg by mouth daily.   vitamin E 400 UNIT capsule Take 800 Units by mouth daily.       Contact information for follow-up providers    BLACKMAN,DOUGLAS A, MD. Go on 01/19/2016.   Specialty:  General Surgery Why:  Your appointment is 01/19/16 at 9:50am. Please arrive 15 minutes prior to your appointment to fill out necessary paperwork. Contact information: [address] [place] [address] [phone]            Contact information for after-discharge care    Destination    [facility] .   Specialty:  [place] information: [place] [phone]                 No Known Allergies  Consultations:  [facility] Surgery - Dr. Ninfa Linden   Procedures/Studies: Dg Chest 2 View  Result Date: 12/18/2015 CLINICAL DATA:  82 y/o F; abdominal surgery last week, suspected sepsis. EXAM: CHEST  2 VIEW COMPARISON:  08/05/2014 chest radiograph.  FINDINGS: Right mid and lower lung zone opacity is consistent with a large hiatal hernia seen on prior CT. Linear and patchy opacities of the lungs bilaterally, predominantly perihilar. Underlying pneumonia is not excluded. No pneumothorax or pleural effusion. Bilateral reverse total shoulder replacements and degenerative changes of the spine. Stable cardiac silhouette. IMPRESSION: Large right-sided hiatal hernia. Linear and patchy lung opacities probably represent mild edema and/or atelectasis. Pneumonia is not excluded. Electronically Signed   By: Kristine Garbe M.D.   On: 12/18/2015 01:28   Ct Abdomen Pelvis W Contrast  Result Date: 12/18/2015 CLINICAL DATA:  Abdominal pain and vomiting. Perforated ulcer 1 year ago with subsequent surgery. Problems since then. EXAM: CT ABDOMEN AND PELVIS WITH CONTRAST TECHNIQUE: Multidetector CT imaging of the abdomen and pelvis was performed using the standard protocol following bolus administration of intravenous contrast. CONTRAST:  11m ISOVUE-300 IOPAMIDOL (ISOVUE-300) INJECTION 61% COMPARISON:  10/27/2015 FINDINGS: Lower chest: Atelectasis in the lung bases. Hepatobiliary: Gallbladder and bile ducts are mildly dilated. No stones or obstructing lesion appreciated. Pancreas: Pancreas is fatty replaced. Spleen: Normal in size without focal abnormality.  Adrenals/Urinary Tract: No adrenal gland nodules. Atrophy and scarring of the kidneys. No hydronephrosis. Central calcification in the right renal hilum may represent a distal renal artery aneurysm. Cyst on the left kidney. No bladder wall thickening bladder stones. No adrenal gland nodules. Stomach/Bowel: Large esophageal hiatal hernia with distention of the stomach and distal esophagus. Small bowel are distended and predominantly fluid-filled throughout. Distal terminal ileum is decompressed. There appears to partial resection of the colon with and ileal colonic anastomosis. Changes suggest small bowel obstruction at the level of the distal ileum, transition zone appears to be in the right lower mid abdomen. Diverticulosis of the sigmoid colon. Mild stranding around the sigmoid colon could also represent diverticulitis. No abscess. Vascular/Lymphatic: Mild calcification and torsion of the aorta. No significant lymphadenopathy. Reproductive: Status post hysterectomy. No adnexal masses. Other: Postoperative changes in the anterior abdominal wall consistent with mesh hernia repair. There appears to be and open incision or scar centrally. No abscess. Small left inguinal hernia containing fat. No free air or free fluid in the abdomen. Musculoskeletal: Degenerative changes and scoliosis of the lumbar spine. IMPRESSION: Diffusely dilated fluid-filled small bowel with transition zone in the distal ileum consistent with small bowel obstruction. Stomach and esophagus are also distended with large esophageal hiatal hernia. Ileocolonic anastomosis. Small left inguinal hernia containing fat. Diverticulosis of the sigmoid colon with stranding in the pelvic fat possibly indicating superimposed diverticulitis. No abscess Electronically Signed   By: WLucienne CapersM.D.   On: 12/18/2015 03:01   Dg Esophagus  Result Date: 12/20/2015 CLINICAL DATA:  Difficulty passing nasogastric tube yesterday. Question esophageal mass. EXAM:  ESOPHOGRAM/BARIUM SWALLOW TECHNIQUE: Single contrast examination was performed using  water-soluble. FLUOROSCOPY TIME:  Fluoroscopy Time:  0 minutes 51 seconds Radiation Exposure Index (if provided by the fluoroscopic device): Number of Acquired Spot Images: 3 COMPARISON:  CT abdomen pelvis 12/18/2015. FINDINGS: A limited examination was performed in the supine position. Patient drank water soluble contrast. The lower esophagus is markedly tortuous. Large hiatal hernia. Assessment for a mass or stricture is limited by suboptimal distension and opacification of the esophagus and hiatal hernia. IMPRESSION: 1. Difficulty passing nasogastric tube likely due to marked lower esophageal tortuosity. 2. Difficult to assess for a mass due to limited opacification and distention of the esophagus. 3. Large hiatal hernia. Electronically Signed   By: MLorin PicketM.D.   On: 12/20/2015 09:27  Dg Chest Port 1 View  Result Date: 12/25/2015 CLINICAL DATA:  Productive cough and shortness of breath. Surgery on 12/22/2015 for infected ventral hernia mesh and small bowel obstruction. EXAM: PORTABLE CHEST 1 VIEW COMPARISON:  CT of the abdomen 12/18/2015 and esophagram on 12/20/2015. FINDINGS: Chronic opacity in the right lower chest likely relates to the known large hiatal/paraesophageal hernia. There may be a developing subtle infiltrate in the left lower lung. Lung volumes are low. The heart size is stable. PICC line tip is in the SVC. IMPRESSION: Possible developing left lower lung infiltrate. Opacity in the right chest likely relates to the known large hiatal/paraesophageal hernia. Electronically Signed   By: Aletta Edouard M.D.   On: 12/25/2015 11:41   Dg Chest Port 1 View  Result Date: 12/22/2015 CLINICAL DATA:  Decreased oxygen saturation. EXAM: PORTABLE CHEST 1 VIEW COMPARISON:  Chest radiographs Aug 05, 2014 and December 18, 2015 FINDINGS: There is a large paraesophageal hernia on the right, grossly stable. There is  mild atelectasis in each mid lung. Lungs elsewhere clear. Heart size is upper normal with pulmonary vascularity within normal limits. There is atherosclerotic calcification in the aorta. Central catheter tip is in the superior vena cava. There are total shoulder replacements bilaterally. No pneumothorax. IMPRESSION: Large right-sided paraesophageal hernia. Areas of mild atelectasis without frank edema or consolidation. Stable cardiac silhouette. Aortic atherosclerosis. Central catheter tip in superior vena cava. Electronically Signed   By: Lowella Grip III M.D.   On: 12/22/2015 15:50   Dg Abd Portable 1v  Result Date: 12/21/2015 CLINICAL DATA:  Small-bowel obstruction. EXAM: PORTABLE ABDOMEN - 1 VIEW COMPARISON:  12/20/2015.  12/19/2015.  CT 12/18/2015. FINDINGS: Prior hernia repair. Persistent distended loops of small bowel noted with slight progression from prior exam. Oral contrast noted in colon. These findings are again consistent with partial small bowel obstruction. No free air. Degenerative changes scoliosis lumbar spine. IMPRESSION: Persistent distended loops of small bowel noted with slight progression from prior exam. Oral contrast in the colon. These findings are consistent with persistent partial small bowel obstruction. Electronically Signed   By: Marcello Moores  Register   On: 12/21/2015 07:37   Dg Abd Portable 1v  Result Date: 12/20/2015 CLINICAL DATA:  Small bowel obstruction history of previous perforated gastric ulcer in 2016. EXAM: PORTABLE ABDOMEN - 1 VIEW COMPARISON:  Portable supine abdominal radiographs of December 19, 2015 FINDINGS: There remain loops of moderately distended gas-filled small bowel in the mid abdomen. A few loops of normal caliber bowel are noted as well. There is some stool and gas in the colon and rectum. No free extraluminal gas collections are observed. There are degenerative changes of the lumbar spine with moderate levocurvature centered at L1. Numerous coils from  previous hernia repair are present. IMPRESSION: Bowel gas pattern consistent with a clinically known partial mid to distal small bowel obstruction. No evidence of perforation currently. Electronically Signed   By: David  Martinique M.D.   On: 12/20/2015 07:46   Dg Abd Portable 1v  Result Date: 12/19/2015 CLINICAL DATA:  82 year old female with history of small-bowel obstruction. EXAM: PORTABLE ABDOMEN - 1 VIEW COMPARISON:  Abdominal radiograph 08/06/2014. FINDINGS: Gas and stool are seen scattered throughout the colon extending to the level of the distal rectum. There are a few mildly dilated loops of small bowel, most evident in the left lower quadrant of the abdomen measuring up to 3.6 cm in diameter. No gross evidence of pneumoperitoneum. Numerous soft tissue anchors are seen projecting over the abdomen,  presumably from prior mesh repair for ventral hernia. Iodinated contrast material within the lumen of the urinary bladder related to recent CT examination. Dextroscoliosis of the lumbar spine convex to the right at the level of L2. Severe multilevel degenerative disc disease in the thoracolumbar spine. IMPRESSION: 1. Findings again suggest partial small bowel obstruction. 2. No pneumoperitoneum. Electronically Signed   By: Vinnie Langton M.D.   On: 12/19/2015 08:40   Dg Addison Bailey G Tube Plc W/fl W/rad  Result Date: 12/19/2015 CLINICAL DATA:  82 year old female with small bowel obstruction in need of nasogastric tube. EXAM: NASO G TUBE PLACEMENT WITH FL AND WITH RAD CONTRAST:  None. FLUOROSCOPY TIME:  Fluoroscopy Time:  6 minutes and 42 seconds COMPARISON:  None. FINDINGS: Despite multiple attempts to fluoroscopically place a nasogastric tube into the patient's stomach, significant resistance was met in the region of the distal third of the esophagus at every attempt, with inability to pass the nasogastric tube beyond the area of apparent physical obstruction. During no time was the tube even able to extend into  the intrathoracic portion of the patient's large hiatal hernia. Accordingly, the tube was removed from the patient, and the examination was terminated. IMPRESSION: 1. Unsuccessful attempt to fluoroscopically place a nasogastric tube into the stomach. Based upon review of the recent CT the abdomen and pelvis, this is presumably related to the patient's complex anatomy. The patient has a very large hiatal hernia, within acute tortuosity of the esophagus as it leads toward the hernia. Given the inability to pass the tube, the possibility of a proximal obstructing mass should be considered, and further evaluation with endoscopy, contrast enhanced chest CT, and/or barium swallow is recommended to better evaluate these findings. Electronically Signed   By: Vinnie Langton M.D.   On: 12/19/2015 15:12      Discharge Exam: Vitals:   01/06/16 0428 01/06/16 1344  BP: (!) 108/48 100/63  Pulse: 81 85  Resp: 17 17  Temp: 99.3 F (37.4 C) 99.5 F (37.5 C)   Vitals:   01/05/16 1453 01/05/16 2055 01/06/16 0428 01/06/16 1344  BP: 95/60 (!) 99/58 (!) 108/48 100/63  Pulse: 84 78 81 85  Resp: _0 Temp: 97.4 F (36.3 C) 97.7 F (36.5 C) 99.3 F (37.4 C) 99.5 F (37.5 C)  TempSrc: Oral Oral Oral Oral  SpO2: 98% 99% 98% 99%  Weight:      Height:        General: Pt is alert, awake, not in acute distress Cardiovascular: RRR, S1/S2 +, no rubs, no gallops Respiratory: CTA bilaterally, no wheezing, no rhonchi Abdominal: Soft, NT/ND, +BS, drain site cdiwith trace serous drainage, midline incision with wound vac in place and steris over remaining part of incision (healing well, no erythema or drainage) Extremities: no edema, no cyanosis  The results of significant diagnostics from this hospitalization (including imaging, microbiology, ancillary and laboratory) are listed below for reference.     Microbiology: Recent Results (from the past 240 hour(s))  Culture, blood (x 2)     Status: None    Collection Time: 12/31/15  9:50 AM  Result Value Ref Range Status   Specimen Description BLOOD LEFT HAND  Final   Special Requests BOTTLES DRAWN AEROBIC AND ANAEROBIC 5CC  Final   Culture NO GROWTH 5 DAYS  Final   Report Status 01/05/2016 FINAL  Final  Culture, blood (x 2)     Status: None   Collection Time: 12/31/15  9:57 AM  Result Value  Ref Range Status   Specimen Description BLOOD LEFT ARM  Final   Special Requests BOTTLES DRAWN AEROBIC ONLY 5CC  Final   Culture NO GROWTH 5 DAYS  Final   Report Status 01/05/2016 FINAL  Final     Labs: BNP (last 3 results) No results for input(s): BNP in the last 8760 hours. Basic Metabolic Panel:  Recent Labs Lab 12/31/15 0420 01/01/16 0530 01/04/16 0428 01/04/16 1328 01/06/16 0500  NA 132* 133* 135  --  134*  K 4.7 4.1 3.6  --  3.9  CL 100* 101 101  --  98*  CO2 _0 --  30  GLUCOSE 97 102* 103*  --  123*  BUN _1 --  6  CREATININE 0.69 0.62 0.61  --  0.57  CALCIUM 8.7* 8.8* 8.8*  --  9.1  MG  --  1.8  --  1.6* 1.9  PHOS  --  3.4  --   --   --    Liver Function Tests:  Recent Labs Lab 12/31/15 0420 01/01/16 0530 01/04/16 0428 01/06/16 0500  AST 16 14* 19 31  ALT 8* 10* 12* 20  ALKPHOS 94 94 116 102  BILITOT 0.3 0.5 0.3 0.3  PROT 4.9* 5.1* 5.5* 5.8*  ALBUMIN 1.4* 1.5* 1.7* 1.7*   No results for input(s): LIPASE, AMYLASE in the last 168 hours. No results for input(s): AMMONIA in the last 168 hours. CBC:  Recent Labs Lab 12/31/15 0420 01/01/16 0530 01/02/16 0445 01/04/16 0428 01/06/16 0500  WBC 13.4* 11.2* 8.6 7.7 11.7*  HGB 7.2* 9.1* 9.1* 9.1* 9.1*  HCT 21.7* 27.3* 27.9* 28.2* 28.9*  MCV 91.2 91.0 91.8 94.9 94.8  PLT 393 404* 451* 458* 469*   Cardiac Enzymes: No results for input(s): CKTOTAL, CKMB, CKMBINDEX, TROPONINI in the last 168 hours. BNP: Invalid input(s): POCBNP CBG: No results for input(s): GLUCAP in the last 168 hours. D-Dimer No results for input(s): DDIMER in the last 72 hours. Hgb  A1c No results for input(s): HGBA1C in the last 72 hours. Lipid Profile No results for input(s): CHOL, HDL, LDLCALC, TRIG, CHOLHDL, LDLDIRECT in the last 72 hours. Thyroid function studies No results for input(s): TSH, T4TOTAL, T3FREE, THYROIDAB in the last 72 hours.  Invalid input(s): FREET3 Anemia work up No results for input(s): VITAMINB12, FOLATE, FERRITIN, TIBC, IRON, RETICCTPCT in the last 72 hours. Urinalysis    Component Value Date/Time   COLORURINE AMBER (A) 12/18/2015 1507   APPEARANCEUR CLEAR 12/18/2015 1507   LABSPEC >1.046 (H) 12/18/2015 1507   PHURINE 5.5 12/18/2015 1507   GLUCOSEU NEGATIVE 12/18/2015 1507   HGBUR NEGATIVE 12/18/2015 1507   BILIRUBINUR SMALL (A) 12/18/2015 1507   KETONESUR NEGATIVE 12/18/2015 1507   PROTEINUR NEGATIVE 12/18/2015 1507   UROBILINOGEN 0.2 08/04/2014 2100   NITRITE NEGATIVE 12/18/2015 1507   LEUKOCYTESUR NEGATIVE 12/18/2015 1507   Sepsis Labs Invalid input(s): PROCALCITONIN,  WBC,  LACTICIDVEN Microbiology Recent Results (from the past 240 hour(s))  Culture, blood (x 2)     Status: None   Collection Time: 12/31/15  9:50 AM  Result Value Ref Range Status   Specimen Description BLOOD LEFT HAND  Final   Special Requests BOTTLES DRAWN AEROBIC AND ANAEROBIC 5CC  Final   Culture NO GROWTH 5 DAYS  Final   Report Status 01/05/2016 FINAL  Final  Culture, blood (x 2)     Status: None   Collection Time: 12/31/15  9:57 AM  Result Value Ref Range Status  Specimen Description BLOOD LEFT ARM  Final   Special Requests BOTTLES DRAWN AEROBIC ONLY 5CC  Final   Culture NO GROWTH 5 DAYS  Final   Report Status 01/05/2016 FINAL  Final     Time coordinating discharge: Over 30 minutes  SIGNED:  Chipper Oman, MD  [facility] 01/06/2016, 1:54 PM Pager   If 7PM-7AM, please contact night-coverage [email or web address] Password [email or web address]

## 2016-01-07 DIAGNOSIS — G894 Chronic pain syndrome: Secondary | ICD-10-CM | POA: Diagnosis not present

## 2016-01-07 DIAGNOSIS — K255 Chronic or unspecified gastric ulcer with perforation: Secondary | ICD-10-CM | POA: Diagnosis not present

## 2016-01-07 DIAGNOSIS — I959 Hypotension, unspecified: Secondary | ICD-10-CM | POA: Diagnosis not present

## 2016-01-07 DIAGNOSIS — K56609 Unspecified intestinal obstruction, unspecified as to partial versus complete obstruction: Secondary | ICD-10-CM | POA: Diagnosis not present

## 2016-01-07 DIAGNOSIS — J189 Pneumonia, unspecified organism: Secondary | ICD-10-CM | POA: Diagnosis not present

## 2016-01-07 DIAGNOSIS — D649 Anemia, unspecified: Secondary | ICD-10-CM | POA: Diagnosis not present

## 2016-01-07 DIAGNOSIS — E46 Unspecified protein-calorie malnutrition: Secondary | ICD-10-CM | POA: Diagnosis not present

## 2016-01-07 DIAGNOSIS — Z9889 Other specified postprocedural states: Secondary | ICD-10-CM | POA: Diagnosis not present

## 2016-01-10 DIAGNOSIS — E43 Unspecified severe protein-calorie malnutrition: Secondary | ICD-10-CM | POA: Diagnosis not present

## 2016-01-10 DIAGNOSIS — K255 Chronic or unspecified gastric ulcer with perforation: Secondary | ICD-10-CM | POA: Diagnosis not present

## 2016-01-10 DIAGNOSIS — K5792 Diverticulitis of intestine, part unspecified, without perforation or abscess without bleeding: Secondary | ICD-10-CM | POA: Diagnosis not present

## 2016-01-10 DIAGNOSIS — K56609 Unspecified intestinal obstruction, unspecified as to partial versus complete obstruction: Secondary | ICD-10-CM | POA: Diagnosis not present

## 2016-01-14 DIAGNOSIS — G894 Chronic pain syndrome: Secondary | ICD-10-CM | POA: Diagnosis not present

## 2016-01-14 DIAGNOSIS — K56609 Unspecified intestinal obstruction, unspecified as to partial versus complete obstruction: Secondary | ICD-10-CM | POA: Diagnosis not present

## 2016-01-14 DIAGNOSIS — R509 Fever, unspecified: Secondary | ICD-10-CM | POA: Diagnosis not present

## 2016-01-14 DIAGNOSIS — K5792 Diverticulitis of intestine, part unspecified, without perforation or abscess without bleeding: Secondary | ICD-10-CM | POA: Diagnosis not present

## 2016-01-17 DIAGNOSIS — K255 Chronic or unspecified gastric ulcer with perforation: Secondary | ICD-10-CM | POA: Diagnosis not present

## 2016-01-17 DIAGNOSIS — G894 Chronic pain syndrome: Secondary | ICD-10-CM | POA: Diagnosis not present

## 2016-01-17 DIAGNOSIS — K56609 Unspecified intestinal obstruction, unspecified as to partial versus complete obstruction: Secondary | ICD-10-CM | POA: Diagnosis not present

## 2016-01-17 DIAGNOSIS — K5792 Diverticulitis of intestine, part unspecified, without perforation or abscess without bleeding: Secondary | ICD-10-CM | POA: Diagnosis not present

## 2016-01-19 DIAGNOSIS — R221 Localized swelling, mass and lump, neck: Secondary | ICD-10-CM | POA: Diagnosis not present

## 2016-01-19 DIAGNOSIS — K219 Gastro-esophageal reflux disease without esophagitis: Secondary | ICD-10-CM | POA: Diagnosis not present

## 2016-01-19 DIAGNOSIS — K5792 Diverticulitis of intestine, part unspecified, without perforation or abscess without bleeding: Secondary | ICD-10-CM | POA: Diagnosis not present

## 2016-01-19 DIAGNOSIS — J189 Pneumonia, unspecified organism: Secondary | ICD-10-CM | POA: Diagnosis not present

## 2016-01-19 DIAGNOSIS — I959 Hypotension, unspecified: Secondary | ICD-10-CM | POA: Diagnosis not present

## 2016-01-19 DIAGNOSIS — R1311 Dysphagia, oral phase: Secondary | ICD-10-CM | POA: Diagnosis not present

## 2016-01-19 DIAGNOSIS — K56609 Unspecified intestinal obstruction, unspecified as to partial versus complete obstruction: Secondary | ICD-10-CM | POA: Diagnosis not present

## 2016-01-19 DIAGNOSIS — E43 Unspecified severe protein-calorie malnutrition: Secondary | ICD-10-CM | POA: Diagnosis not present

## 2016-01-19 DIAGNOSIS — M6281 Muscle weakness (generalized): Secondary | ICD-10-CM | POA: Diagnosis not present

## 2016-01-19 DIAGNOSIS — Z48815 Encounter for surgical aftercare following surgery on the digestive system: Secondary | ICD-10-CM | POA: Diagnosis not present

## 2016-01-19 DIAGNOSIS — E871 Hypo-osmolality and hyponatremia: Secondary | ICD-10-CM

## 2016-01-19 DIAGNOSIS — G4720 Circadian rhythm sleep disorder, unspecified type: Secondary | ICD-10-CM | POA: Diagnosis not present

## 2016-01-19 DIAGNOSIS — G894 Chronic pain syndrome: Secondary | ICD-10-CM | POA: Diagnosis not present

## 2016-01-19 HISTORY — DX: Hypo-osmolality and hyponatremia: E87.1

## 2016-01-20 ENCOUNTER — Inpatient Hospital Stay (HOSPITAL_COMMUNITY): Payer: PPO

## 2016-01-20 ENCOUNTER — Inpatient Hospital Stay (HOSPITAL_COMMUNITY)
Admission: EM | Admit: 2016-01-20 | Discharge: 2016-01-24 | DRG: 871 | Disposition: A | Discharge status: Skilled Nursing Facility | Payer: PPO | Attending: Internal Medicine | Admitting: Internal Medicine

## 2016-01-20 ENCOUNTER — Encounter (HOSPITAL_COMMUNITY): Payer: Self-pay

## 2016-01-20 ENCOUNTER — Emergency Department (HOSPITAL_COMMUNITY): Payer: PPO

## 2016-01-20 DIAGNOSIS — I5032 Chronic diastolic (congestive) heart failure: Secondary | ICD-10-CM | POA: Diagnosis not present

## 2016-01-20 DIAGNOSIS — Z6832 Body mass index (BMI) 32.0-32.9, adult: Secondary | ICD-10-CM | POA: Diagnosis not present

## 2016-01-20 DIAGNOSIS — G894 Chronic pain syndrome: Secondary | ICD-10-CM | POA: Diagnosis present

## 2016-01-20 DIAGNOSIS — Z79891 Long term (current) use of opiate analgesic: Secondary | ICD-10-CM | POA: Diagnosis not present

## 2016-01-20 DIAGNOSIS — K219 Gastro-esophageal reflux disease without esophagitis: Secondary | ICD-10-CM | POA: Diagnosis not present

## 2016-01-20 DIAGNOSIS — D649 Anemia, unspecified: Secondary | ICD-10-CM

## 2016-01-20 DIAGNOSIS — Y92129 Unspecified place in nursing home as the place of occurrence of the external cause: Secondary | ICD-10-CM | POA: Diagnosis not present

## 2016-01-20 DIAGNOSIS — Z66 Do not resuscitate: Secondary | ICD-10-CM | POA: Diagnosis present

## 2016-01-20 DIAGNOSIS — Z90710 Acquired absence of both cervix and uterus: Secondary | ICD-10-CM

## 2016-01-20 DIAGNOSIS — R627 Adult failure to thrive: Secondary | ICD-10-CM | POA: Diagnosis present

## 2016-01-20 DIAGNOSIS — E43 Unspecified severe protein-calorie malnutrition: Secondary | ICD-10-CM | POA: Diagnosis present

## 2016-01-20 DIAGNOSIS — T8189XD Other complications of procedures, not elsewhere classified, subsequent encounter: Secondary | ICD-10-CM

## 2016-01-20 DIAGNOSIS — D638 Anemia in other chronic diseases classified elsewhere: Secondary | ICD-10-CM | POA: Diagnosis present

## 2016-01-20 DIAGNOSIS — Z7189 Other specified counseling: Secondary | ICD-10-CM | POA: Diagnosis not present

## 2016-01-20 DIAGNOSIS — R5383 Other fatigue: Secondary | ICD-10-CM | POA: Diagnosis not present

## 2016-01-20 DIAGNOSIS — T426X5A Adverse effect of other antiepileptic and sedative-hypnotic drugs, initial encounter: Secondary | ICD-10-CM | POA: Diagnosis not present

## 2016-01-20 DIAGNOSIS — Z91048 Other nonmedicinal substance allergy status: Secondary | ICD-10-CM

## 2016-01-20 DIAGNOSIS — J811 Chronic pulmonary edema: Secondary | ICD-10-CM | POA: Diagnosis not present

## 2016-01-20 DIAGNOSIS — D75839 Thrombocytosis, unspecified: Secondary | ICD-10-CM

## 2016-01-20 DIAGNOSIS — A419 Sepsis, unspecified organism: Secondary | ICD-10-CM | POA: Diagnosis not present

## 2016-01-20 DIAGNOSIS — Z8711 Personal history of peptic ulcer disease: Secondary | ICD-10-CM

## 2016-01-20 DIAGNOSIS — R54 Age-related physical debility: Secondary | ICD-10-CM | POA: Diagnosis present

## 2016-01-20 DIAGNOSIS — Z515 Encounter for palliative care: Secondary | ICD-10-CM | POA: Diagnosis not present

## 2016-01-20 DIAGNOSIS — K1120 Sialoadenitis, unspecified: Secondary | ICD-10-CM | POA: Diagnosis present

## 2016-01-20 DIAGNOSIS — M549 Dorsalgia, unspecified: Secondary | ICD-10-CM | POA: Diagnosis not present

## 2016-01-20 DIAGNOSIS — T403X5A Adverse effect of methadone, initial encounter: Secondary | ICD-10-CM | POA: Diagnosis not present

## 2016-01-20 DIAGNOSIS — E871 Hypo-osmolality and hyponatremia: Secondary | ICD-10-CM | POA: Diagnosis not present

## 2016-01-20 DIAGNOSIS — Z993 Dependence on wheelchair: Secondary | ICD-10-CM

## 2016-01-20 DIAGNOSIS — R4182 Altered mental status, unspecified: Secondary | ICD-10-CM | POA: Diagnosis not present

## 2016-01-20 DIAGNOSIS — R7989 Other specified abnormal findings of blood chemistry: Secondary | ICD-10-CM | POA: Diagnosis not present

## 2016-01-20 DIAGNOSIS — S31109D Unspecified open wound of abdominal wall, unspecified quadrant without penetration into peritoneal cavity, subsequent encounter: Secondary | ICD-10-CM

## 2016-01-20 DIAGNOSIS — Z803 Family history of malignant neoplasm of breast: Secondary | ICD-10-CM | POA: Diagnosis not present

## 2016-01-20 DIAGNOSIS — G9341 Metabolic encephalopathy: Secondary | ICD-10-CM | POA: Diagnosis not present

## 2016-01-20 DIAGNOSIS — I110 Hypertensive heart disease with heart failure: Secondary | ICD-10-CM | POA: Diagnosis present

## 2016-01-20 DIAGNOSIS — D473 Essential (hemorrhagic) thrombocythemia: Secondary | ICD-10-CM

## 2016-01-20 DIAGNOSIS — D62 Acute posthemorrhagic anemia: Secondary | ICD-10-CM | POA: Diagnosis not present

## 2016-01-20 HISTORY — DX: Hypo-osmolality and hyponatremia: E87.1

## 2016-01-20 HISTORY — DX: Anemia, unspecified: D64.9

## 2016-01-20 LAB — URINALYSIS, ROUTINE W REFLEX MICROSCOPIC
BILIRUBIN URINE: NEGATIVE
GLUCOSE, UA: NEGATIVE mg/dL
HGB URINE DIPSTICK: NEGATIVE
KETONES UR: NEGATIVE mg/dL
Nitrite: NEGATIVE
PROTEIN: NEGATIVE mg/dL
Specific Gravity, Urine: 1.015 (ref 1.005–1.030)
pH: 7 (ref 5.0–8.0)

## 2016-01-20 LAB — CBC WITH DIFFERENTIAL/PLATELET
Basophils Absolute: 0 10*3/uL (ref 0.0–0.1)
Basophils Relative: 0 %
EOS ABS: 0.4 10*3/uL (ref 0.0–0.7)
EOS PCT: 3 %
HCT: 32.6 % — ABNORMAL LOW (ref 36.0–46.0)
Hemoglobin: 10.2 g/dL — ABNORMAL LOW (ref 12.0–15.0)
LYMPHS ABS: 2.3 10*3/uL (ref 0.7–4.0)
LYMPHS PCT: 16 %
MCH: 29.7 pg (ref 26.0–34.0)
MCHC: 31.3 g/dL (ref 30.0–36.0)
MCV: 95 fL (ref 78.0–100.0)
MONO ABS: 1.2 10*3/uL — AB (ref 0.1–1.0)
Monocytes Relative: 9 %
Neutro Abs: 9.9 10*3/uL — ABNORMAL HIGH (ref 1.7–7.7)
Neutrophils Relative %: 72 %
PLATELETS: 447 10*3/uL — AB (ref 150–400)
RBC: 3.43 MIL/uL — ABNORMAL LOW (ref 3.87–5.11)
RDW: 14.7 % (ref 11.5–15.5)
WBC: 13.8 10*3/uL — AB (ref 4.0–10.5)

## 2016-01-20 LAB — COMPREHENSIVE METABOLIC PANEL
ALBUMIN: 1.9 g/dL — AB (ref 3.5–5.0)
ALK PHOS: 130 U/L — AB (ref 38–126)
ALT: 14 U/L (ref 14–54)
ANION GAP: 7 (ref 5–15)
AST: 25 U/L (ref 15–41)
BILIRUBIN TOTAL: 0.3 mg/dL (ref 0.3–1.2)
BUN: 14 mg/dL (ref 6–20)
CALCIUM: 10.1 mg/dL (ref 8.9–10.3)
CO2: 32 mmol/L (ref 22–32)
Chloride: 92 mmol/L — ABNORMAL LOW (ref 101–111)
Creatinine, Ser: 0.62 mg/dL (ref 0.44–1.00)
GFR calc Af Amer: >60 mL/min (ref >60)
GLUCOSE: 109 mg/dL — AB (ref 65–99)
POTASSIUM: 4.7 mmol/L (ref 3.5–5.1)
Sodium: 131 mmol/L — ABNORMAL LOW (ref 135–145)
TOTAL PROTEIN: 7.2 g/dL (ref 6.5–8.1)

## 2016-01-20 LAB — URINE MICROSCOPIC-ADD ON

## 2016-01-20 LAB — I-STAT BETA HCG BLOOD, ED (MC, WL, AP ONLY)

## 2016-01-20 LAB — CBG MONITORING, ED: GLUCOSE-CAPILLARY: 99 mg/dL (ref 65–99)

## 2016-01-20 LAB — OSMOLALITY: Osmolality: 278 mOsm/kg (ref 275–295)

## 2016-01-20 LAB — OSMOLALITY, URINE: OSMOLALITY UR: 413 mosm/kg (ref 300–900)

## 2016-01-20 LAB — SODIUM, URINE, RANDOM: Sodium, Ur: 80 mmol/L

## 2016-01-20 LAB — I-STAT CG4 LACTIC ACID, ED: Lactic Acid, Venous: 1.06 mmol/L (ref 0.5–1.9)

## 2016-01-20 MED ORDER — SODIUM CHLORIDE 0.9 % IV BOLUS (SEPSIS)
1000.0000 mL | Freq: Once | INTRAVENOUS | Status: AC
  Administered 2016-01-20: 1000 mL via INTRAVENOUS

## 2016-01-20 MED ORDER — VANCOMYCIN HCL IN DEXTROSE 1-5 GM/200ML-% IV SOLN
1000.0000 mg | Freq: Once | INTRAVENOUS | Status: AC
  Administered 2016-01-20: 1000 mg via INTRAVENOUS
  Filled 2016-01-20: qty 200

## 2016-01-20 MED ORDER — POLYETHYLENE GLYCOL 3350 17 G PO PACK: 17.0000 g | PACK | Freq: Every day | ORAL | Status: DC | PRN

## 2016-01-20 MED ORDER — TIMOLOL MALEATE 0.5 % OP SOLN
1.0000 [drp] | Freq: Two times a day (BID) | OPHTHALMIC | Status: DC
  Administered 2016-01-20 – 2016-01-24 (×8): 1 [drp] via OPHTHALMIC
  Filled 2016-01-20: qty 5

## 2016-01-20 MED ORDER — ENOXAPARIN SODIUM 40 MG/0.4ML ~~LOC~~ SOLN
40.0000 mg | SUBCUTANEOUS | Status: DC
  Administered 2016-01-20 – 2016-01-23 (×4): 40 mg via SUBCUTANEOUS
  Filled 2016-01-20 (×4): qty 0.4

## 2016-01-20 MED ORDER — ACETAMINOPHEN 325 MG PO TABS
650.0000 mg | ORAL_TABLET | Freq: Four times a day (QID) | ORAL | Status: DC | PRN
Start: 2016-01-20 — End: 2016-01-24
  Administered 2016-01-22 – 2016-01-24 (×3): 650 mg via ORAL
  Filled 2016-01-20 (×4): qty 2

## 2016-01-20 MED ORDER — BOOST PLUS PO LIQD
237.0000 mL | Freq: Two times a day (BID) | ORAL | Status: DC
  Administered 2016-01-21 – 2016-01-24 (×4): 237 mL via ORAL
  Filled 2016-01-20 (×10): qty 237

## 2016-01-20 MED ORDER — MAGNESIUM HYDROXIDE 400 MG/5ML PO SUSP
30.0000 mL | ORAL | Status: DC
  Administered 2016-01-22 – 2016-01-24 (×2): 30 mL via ORAL
  Filled 2016-01-20 (×3): qty 30

## 2016-01-20 MED ORDER — IOPAMIDOL (ISOVUE-300) INJECTION 61%
INTRAVENOUS | Status: AC
  Filled 2016-01-20: qty 100

## 2016-01-20 MED ORDER — DICLOFENAC SODIUM 1 % TD GEL
2.0000 g | TRANSDERMAL | Status: DC | PRN
  Filled 2016-01-20: qty 100

## 2016-01-20 MED ORDER — PRO-STAT SUGAR FREE PO LIQD
30.0000 mL | Freq: Three times a day (TID) | ORAL | Status: DC
  Administered 2016-01-20 – 2016-01-24 (×7): 30 mL via ORAL
  Filled 2016-01-20 (×10): qty 30

## 2016-01-20 MED ORDER — BISACODYL 10 MG RE SUPP
10.0000 mg | Freq: Every day | RECTAL | Status: DC | PRN
Start: 2016-01-20 — End: 2016-01-24

## 2016-01-20 MED ORDER — PIPERACILLIN-TAZOBACTAM 3.375 G IVPB
3.3750 g | Freq: Three times a day (TID) | INTRAVENOUS | Status: DC
  Administered 2016-01-21 – 2016-01-23 (×8): 3.375 g via INTRAVENOUS
  Filled 2016-01-20 (×9): qty 50

## 2016-01-20 MED ORDER — SODIUM CHLORIDE 0.9 % IV SOLN
1250.0000 mg | INTRAVENOUS | Status: DC
  Administered 2016-01-21 – 2016-01-24 (×4): 1250 mg via INTRAVENOUS
  Filled 2016-01-20 (×5): qty 1250

## 2016-01-20 MED ORDER — GABAPENTIN 800 MG PO TABS
400.0000 mg | ORAL_TABLET | Freq: Two times a day (BID) | ORAL | Status: DC
  Filled 2016-01-20: qty 0.5

## 2016-01-20 MED ORDER — GABAPENTIN 400 MG PO CAPS
400.0000 mg | ORAL_CAPSULE | Freq: Two times a day (BID) | ORAL | Status: DC
  Administered 2016-01-20 – 2016-01-21 (×2): 400 mg via ORAL
  Filled 2016-01-20 (×4): qty 1

## 2016-01-20 MED ORDER — ONDANSETRON HCL 4 MG PO TABS: 4.0000 mg | ORAL_TABLET | Freq: Four times a day (QID) | ORAL | Status: DC | PRN

## 2016-01-20 MED ORDER — MAGNESIUM OXIDE 400 (241.3 MG) MG PO TABS
400.0000 mg | ORAL_TABLET | Freq: Every day | ORAL | Status: DC
  Administered 2016-01-21 – 2016-01-24 (×4): 400 mg via ORAL
  Filled 2016-01-20 (×4): qty 1

## 2016-01-20 MED ORDER — OXYCODONE HCL 5 MG PO TABS: 5.0000 mg | ORAL_TABLET | ORAL | Status: DC | PRN

## 2016-01-20 MED ORDER — DULOXETINE HCL 60 MG PO CPEP
60.0000 mg | ORAL_CAPSULE | Freq: Every day | ORAL | Status: DC
  Administered 2016-01-20 – 2016-01-23 (×3): 60 mg via ORAL
  Filled 2016-01-20 (×4): qty 1

## 2016-01-20 MED ORDER — ESOMEPRAZOLE MAGNESIUM 20 MG PO CPDR
20.0000 mg | DELAYED_RELEASE_CAPSULE | Freq: Every day | ORAL | Status: DC
  Administered 2016-01-21 – 2016-01-24 (×4): 20 mg via ORAL
  Filled 2016-01-20 (×4): qty 1

## 2016-01-20 MED ORDER — ONDANSETRON HCL 4 MG/2ML IJ SOLN: 4.0000 mg | Freq: Four times a day (QID) | INTRAMUSCULAR | Status: DC | PRN

## 2016-01-20 MED ORDER — ACETAMINOPHEN 650 MG RE SUPP: 650.0000 mg | Freq: Four times a day (QID) | RECTAL | Status: DC | PRN

## 2016-01-20 MED ORDER — SODIUM CHLORIDE 0.9 % IV BOLUS (SEPSIS): 1000.0000 mL | Freq: Once | INTRAVENOUS | Status: DC

## 2016-01-20 MED ORDER — METHADONE HCL 5 MG PO TABS
5.0000 mg | ORAL_TABLET | Freq: Two times a day (BID) | ORAL | Status: DC
  Administered 2016-01-20 – 2016-01-24 (×7): 5 mg via ORAL
  Filled 2016-01-20 (×8): qty 1

## 2016-01-20 MED ORDER — PIPERACILLIN-TAZOBACTAM 3.375 G IVPB 30 MIN
3.3750 g | Freq: Once | INTRAVENOUS | Status: AC
Start: 2016-01-20 — End: 2016-01-20
  Administered 2016-01-20: 3.375 g via INTRAVENOUS
  Filled 2016-01-20: qty 50

## 2016-01-20 MED ORDER — IOPAMIDOL (ISOVUE-300) INJECTION 61%
INTRAVENOUS | Status: AC
  Administered 2016-01-21: 100 mL
  Filled 2016-01-20: qty 100

## 2016-01-20 NOTE — ED Provider Notes (Signed)
[facility] DEPT Provider Note   CSN: [id number] Arrival date & time: 01/20/16  1703     History   Chief Complaint Chief Complaint  Patient presents with  . Altered Mental Status    HPI Monique Russo is a 82 y.o. female.  She was transferred from a physician's office because of fever and altered mental status. She had been admitted to the hospital last month for small bowel obstruction and infected mesh from hernia repair and underwent exploratory laparotomy. She was discharged to a skilled nursing facility 2 weeks ago. Family states that she has had altered mentation over the last week. They're not aware of any fever, but she was noted to be febrile on the surgeon's office today (temperature 100.5). They state that her condition has been steadily deteriorating. There's been no known vomiting but she has had relatively poor oral intake. She has a very complicated medical history including congestive heart failure, pulmonary hypertension, gastroesophageal reflux disease, chronic pain on methadone. Family also relates that she did have some bedsores about a week ago but they have improved. Surgeon who saw her today also noted a swollen lymph gland on the right side of her neck. Patient had complained of an aphthous ulcer during the past week. She is able to communicate with me to the point where she states that she hurts all over.   The history is provided by a relative and the spouse.  Altered Mental Status      Past Medical History:  Diagnosis Date  . Arthritis   . CHF (congestive heart failure) ([place])   . Depression   . GERD (gastroesophageal reflux disease)   . Perforated chronic gastric ulcer ([place]) 08/11/2014  . Pneumonia   . Pulmonary arterial hypertension 11/28/2014    Patient Active Problem List   Diagnosis Date Noted  . Hypotension   . Difficulty in walking, not elsewhere classified   . Disorder of sleep wake schedule   . Protein-calorie malnutrition, severe ([place])    . End of life care   . Goals of care, counseling/discussion   . Palliative care by specialist   . Chronic bilateral low back pain without sciatica   . Small bowel obstruction 12/18/2015  . Diverticulitis of large intestine without perforation or abscess without bleeding 12/18/2015  . SBO (small bowel obstruction) 12/18/2015  . Left ventricular dysfunction  reported in chart, not confirmed on review of echo 11/28/2014  . Pulmonary arterial hypertension 11/28/2014  . Perforated chronic gastric ulcer ([place]) 08/11/2014  . Encounter for intubation   . Chronic pain syndrome   . Hyponatremia 07/17/2014  . Fatigue 07/09/2013  . DNR (do not resuscitate) 07/07/2013  . Palliative care encounter 07/07/2013  . Narcotic overdose 07/03/2013    Past Surgical History:  Procedure Laterality Date  . ABDOMINAL HYSTERECTOMY    . ABDOMINAL SURGERY    . BACK SURGERY  2000  . COLON SURGERY     x2-blockage  . COLONOSCOPY    . ERCP    . HERNIA REPAIR  2012   x2 with mesh  . JOINT REPLACEMENT Left    shoulder  . LAPAROTOMY N/A 08/04/2014   Procedure: EXPLORATORY LAPAROTOMY, REPAIR OF PERFORATED ULCER;  Surgeon: Excell Seltzer, MD;  Location: [facility];  Service: General;  Laterality: N/A;  . LAPAROTOMY N/A 12/21/2015   Procedure: EXPLORATORY LAPAROTOMY/ REMOVAL OF MESH;  Surgeon: Coralie Keens, MD;  Location: [facility];  Service: General;  Laterality: N/A;  . ORIF WRIST FRACTURE  2012  left  . REVERSE SHOULDER ARTHROPLASTY Right 12/04/2013  . REVERSE SHOULDER ARTHROPLASTY  12/04/2013   Procedure: REVERSE SHOULDER ARTHROPLASTY;  Surgeon: Nita Sells, MD;  Location[facility];  Service: Orthopedics;;  Right reverse total shoulder arthroplasty  . ROTATOR CUFF REPAIR     dr Tamera Punt  . SHOULDER ARTHROSCOPY  2012   lt and rt  . TENDON REPAIR Right 04/13/2014   Procedure: RIGHT HAND CENTRAL TENDON CENTRALIZATION OF LONG AND RING FINGERS;  Surgeon: Jolyn Nap, MD;  Location: [facility];  Service: Orthopedics;  Laterality: Right;    OB History    No data available       Home Medications    Prior to Admission medications   Medication Sig Start Date End Date Taking? Authorizing Provider  acetaminophen (TYLENOL) 325 MG tablet Take 325 mg by mouth every 6 (six) hours as needed for mild pain or headache. 07/10/13   Melton Alar, PA-C  alum & mag hydroxide-simeth (MAALOX/MYLANTA) 200-200-20 MG/5ML suspension Take 15 mLs by mouth as needed for indigestion or heartburn. 01/06/16   Doreatha Lew, MD  Ascorbic Acid (VITAMIN C) 1000 MG tablet Take 1,000 mg by mouth daily.    Historical Provider, MD  b complex vitamins tablet Take 1 tablet by mouth daily as needed.     Historical Provider, MD  bisacodyl (DULCOLAX) 10 MG suppository Place 1 suppository (10 mg total) rectally daily as needed for moderate constipation (May repeat times one). 07/10/13   Bobby Rumpf [place], PA-C  budesonide (RHINOCORT AQUA) 32 MCG/ACT nasal spray Place 1 spray into both nostrils daily as needed for rhinitis.    Historical Provider, MD  cholecalciferol (VITAMIN D) 1000 UNITS tablet Take 1,000 Units by mouth daily.    Historical Provider, MD  diclofenac sodium (VOLTAREN) 1 % GEL Apply topically every three (3) days as needed (For Leg pain). 1 gm    Historical Provider, MD  DULoxetine (CYMBALTA) 60 MG capsule Take 60 mg by mouth daily. At bedtime    Historical Provider, MD  Esomeprazole Magnesium (NEXIUM 24HR PO) Take 22.3 mg by mouth daily.    Historical Provider, MD  furosemide (LASIX) 80 MG tablet Take 80 mg by mouth daily as needed for fluid.     Historical Provider, MD  gabapentin (NEURONTIN) 800 MG tablet Take 800 mg by mouth 2 (two) times daily. 09/11/15   Historical Provider, MD  lactose free nutrition (BOOST PLUS) LIQD Take 237 mLs by mouth 2 (two) times daily between meals. 08/12/14   Earnstine Regal, PA-C  LORazepam (ATIVAN) 1 MG tablet Take 1 tablet (1 mg total) by mouth every 4  (four) hours as needed for anxiety. 12/30/15   Reyne Dumas, MD  Magnesium 400 MG TABS Take 400 mg by mouth daily.    Historical Provider, MD  magnesium hydroxide (MILK OF MAGNESIA) 400 MG/5ML suspension Take 30 mLs by mouth every other day. As needed for constipation    Historical Provider, MD  methadone (DOLOPHINE) 5 MG tablet Take 5 mg by mouth every 8 (eight) hours.     Historical Provider, MD  methocarbamol (ROBAXIN) 750 MG tablet Take 750 mg by mouth every 8 (eight) hours as needed for muscle spasms (Prescribed TID but patient takes PRN).     Historical Provider, MD  Morphine Sulfate (MORPHINE CONCENTRATE) 10 MG/0.5ML SOLN concentrated solution Take 0.25 mLs (5 mg total) by mouth every 2 (two) hours as needed for severe pain. 12/30/15   Reyne Dumas, MD  Multiple Vitamins-Iron (MULTIVITAMINS WITH IRON) TABS tablet Take 1 tablet by mouth daily with supper. 08/12/14   Earnstine Regal, PA-C  Omega-3 Fatty Acids (FISH OIL) 1000 MG CPDR Take 1,000 mg by mouth daily.     Historical Provider, MD  oxyCODONE (ROXICODONE) 15 MG immediate release tablet Take 15 mg by mouth every 8 (eight) hours as needed for pain. 12/03/15   Historical Provider, MD  potassium chloride SA (K-DUR,KLOR-CON) 20 MEQ tablet Take 20 mEq by mouth daily as needed (Only takes with lasix).    Historical Provider, MD  timolol (BETIMOL) 0.5 % ophthalmic solution Place 1 drop into both eyes 2 (two) times daily.     Historical Provider, MD  vitamin E 400 UNIT capsule Take 800 Units by mouth daily.     Historical Provider, MD    Family History Family History  Problem Relation Age of Onset  . Stroke Mother   . Breast cancer Sister   . Ulcerative colitis Daughter   . Chronic fatigue Daughter   . Chronic fatigue Daughter     Social History Social History  Substance Use Topics  . Smoking status: Never Smoker  . Smokeless tobacco: Never Used  . Alcohol use 4.2 oz/week    7 Glasses of wine per week     Comment: 8 oz -12 oz of  wine every      Allergies   Review of patient's allergies indicates no known allergies.   Review of Systems Review of Systems  Unable to perform ROS: Mental status change     Physical Exam Updated Vital Signs BP (!) 125/50 (BP Location: Right Arm)   Pulse 78   Temp 100.8 F (38.2 C) (Rectal)   Resp 14   Ht 5' (1.524 m)   Wt 197 lb (89.4 kg)   SpO2 96%   BMI 38.47 kg/m   Physical Exam  Nursing note and vitals reviewed.  82 year old [place], resting comfortably and in no acute distress. Vital signs are significant for fever. Oxygen saturation is 96%, which is normal. Head is normocephalic and atraumatic. PERRLA, EOMI. Oropharynx is clear. Neck has a firm, tender 1.5 cm right submandibular lymph node palpable. There is no JVD. Back is nontender and there is no CVA tenderness. Lungs are clear without rales, wheezes, or rhonchi. Chest is nontender. Heart has regular rate and rhythm without murmur. Abdomen: There is a midline surgical incision which is packed with gauze. There is no evident drainage and the wound edges appear pink. Mesh is visible at the base of the wound. Remainder of abdomen is diffusely tender. Bowel sounds are decreased. Extremities have no cyanosis or edema, full range of motion is present. Skin is warm and dry. There is minimal erythema to the sacral area consistent with possible early stage I decubitus. No actual skin breakdown and no drainage. Neurologic: She is somnolent but arousable and she is oriented to person but not place or time. When aroused, she will talk nonstop but really not make much sense. Cranial nerves are intact, there are no motor or sensory deficits.  ED Treatments / Results  Labs (all labs ordered are listed, but only abnormal results are displayed) Labs Reviewed  COMPREHENSIVE METABOLIC PANEL - Abnormal; Notable for the following:       Result Value   Sodium 131 (*)    Chloride 92 (*)    Glucose, Bld 109 (*)    Albumin 1.9 (*)     Alkaline Phosphatase 130 (*)  All other components within normal limits  CBC WITH DIFFERENTIAL/PLATELET - Abnormal; Notable for the following:    WBC 13.8 (*)    RBC 3.43 (*)    Hemoglobin 10.2 (*)    HCT 32.6 (*)    Platelets 447 (*)    Neutro Abs 9.9 (*)    Monocytes Absolute 1.2 (*)    All other components within normal limits  CULTURE, BLOOD (ROUTINE X 2)  CULTURE, BLOOD (ROUTINE X 2)  URINE CULTURE  URINALYSIS, ROUTINE W REFLEX MICROSCOPIC (NOT AT [facility])  PROCALCITONIN  BASIC METABOLIC PANEL  CBC  SODIUM, URINE, RANDOM  OSMOLALITY, URINE  OSMOLALITY  I-STAT BETA HCG BLOOD, ED ([facility], [facility], [facility] ONLY)  I-STAT CG4 LACTIC ACID, ED  CBG MONITORING, ED    EKG  EKG Interpretation  Date/Time:  Thursday January 20 2016 17:18:31 EDT Ventricular Rate:  80 PR Interval:    QRS Duration: 92 QT Interval:  358 QTC Calculation: 413 R Axis:   36 Text Interpretation:  Sinus rhythm Normal ECG When compared with ECG of 12/29/2015, No significant change was found Confirmed by [facility]  MD, Kellan Raffield ([id number]) on 01/20/2016 5:24:41 PM       Radiology Dg Chest Port 1 View  Result Date: 01/20/2016 CLINICAL DATA:  fever EXAM: PORTABLE CHEST 1 VIEW COMPARISON:  12/25/2015 FINDINGS: Right-sided PICC line has been removed. The heart is enlarged. Stable elevation of right hemidiaphragm. There is interstitial edema. There has been improvement aeration of the right lung base. No new consolidation. IMPRESSION: 1. Cardiomegaly and mild interstitial edema. 2. Improved aeration. Electronically Signed   By: Nolon Nations M.D.   On: 01/20/2016 18:28    Procedures Procedures (including critical care time) CRITICAL CARE Performed by: [id number] Total critical care time: 65 minutes Critical care time was exclusive of separately billable procedures and treating other patients. Critical care was necessary to treat or prevent imminent or life-threatening deterioration. Critical care was time spent  personally by me on the following activities: development of treatment plan with patient and/or surrogate as well as nursing, discussions with consultants, evaluation of patient's response to treatment, examination of patient, obtaining history from patient or surrogate, ordering and performing treatments and interventions, ordering and review of laboratory studies, ordering and review of radiographic studies, pulse oximetry and re-evaluation of patient's condition.  Medications Ordered in ED Medications  iopamidol (ISOVUE-300) 61 % injection (not administered)  piperacillin-tazobactam (ZOSYN) IVPB 3.375 g (not administered)  vancomycin (VANCOCIN) 1,250 mg in sodium chloride 0.9 % 250 mL IVPB (not administered)  feeding supplement (PRO-STAT SUGAR FREE 64) liquid 30 mL (not administered)  polyethylene glycol (MIRALAX / GLYCOLAX) packet 17 g (not administered)  magnesium oxide (MAG-OX) tablet 400 mg (not administered)  oxyCODONE (Oxy IR/ROXICODONE) immediate release tablet 5 mg (not administered)  esomeprazole (NEXIUM) capsule 20 mg (not administered)  lactose free nutrition (BOOST PLUS) liquid 237 mL (not administered)  DULoxetine (CYMBALTA) DR capsule 60 mg (not administered)  diclofenac sodium (VOLTAREN) 1 % transdermal gel 2 g (not administered)  methadone (DOLOPHINE) tablet 5 mg (not administered)  magnesium hydroxide (MILK OF MAGNESIA) suspension 30 mL (not administered)  bisacodyl (DULCOLAX) suppository 10 mg (not administered)  timolol (TIMOPTIC) 0.5 % ophthalmic solution 1 drop (not administered)  enoxaparin (LOVENOX) injection 40 mg (not administered)  acetaminophen (TYLENOL) tablet 650 mg (not administered)    Or  acetaminophen (TYLENOL) suppository 650 mg (not administered)  ondansetron (ZOFRAN) tablet 4 mg (not administered)    Or  ondansetron (ZOFRAN) injection 4  mg (not administered)  gabapentin (NEURONTIN) capsule 400 mg (not administered)  iopamidol (ISOVUE-300) 61 %  injection (not administered)  sodium chloride 0.9 % bolus 1,000 mL (1,000 mLs Intravenous New Bag/Given 01/20/16 1802)    And  sodium chloride 0.9 % bolus 1,000 mL (1,000 mLs Intravenous New Bag/Given 01/20/16 1825)  piperacillin-tazobactam (ZOSYN) IVPB 3.375 g (0 g Intravenous Stopped 01/20/16 1833)  vancomycin (VANCOCIN) IVPB 1000 mg/200 mL premix (1,000 mg Intravenous New Bag/Given 01/20/16 1803)     Initial Impression / Assessment and Plan / ED Course  I have reviewed the triage vital signs and the nursing notes.  Pertinent labs & imaging results that were available during my care of the patient were reviewed by me and considered in my medical decision making (see chart for details).  Clinical Course   Fever with altered mental status consistent with sepsis. Code sepsis is activated. Old records are reviewed confirming an extended hospital stay related to small bowel obstruction and exploratory laparotomy. She was DO NOT RESUSCITATE during her hospital stay and I have confirmed with husband and daughter that she remains DO NOT RESUSCITATE. Source of infection is not clear. There is no evidence of wound infection. It is unclear the reason that she has the swollen, tender lymph gland. She will be sent for CT of abdomen and of the neck soft tissue, also chest x-ray will be obtained. She started on empiric Biotic therapy for sepsis of unknown source.  Chest x-ray shows no pneumonia. At this point, we have been unable to obtain urine sample. CT of abdomen and pelvis and CT of the neck are pending. After workup is somewhat reassuring with normal lactic acid. Case is discussed with Dr. Loleta Books [facility] who agrees to admit the patient.  Final Clinical Impressions(s) / ED Diagnoses   Final diagnoses:  Sepsis, due to unspecified organism [facility])  Open abdominal wall wound, subsequent encounter  Hyponatremia  Normochromic normocytic anemia  Thrombocytosis (HCC)    New Prescriptions New  Prescriptions   No medications on file     Delora Fuel, MD [id number] [phone]

## 2016-01-20 NOTE — ED Triage Notes (Signed)
Per PT[facility], pt was picked up by ptar to be taken to surgeon's office for post op visit wound check. Upon arrival pt was altered and only responding to painful stimuli. Axillary temp at office was 99.5. Pt not making any sense with statements. Pt's daughter told [facility] that pt has been febrile and lethargic x 2 days. Pt has swollen lymphnode to right neck. Pt on 2L [place] that she wears all the time. Upon arrival here pt alert and oriented to self only. Pt complains of pain all over. BP 106/60, HR 80, 95% on 2L, RR 20.

## 2016-01-20 NOTE — ED Notes (Addendum)
Attempted an I&O cath. Pt states she couldn't tolerate and refuses another attempt. While inserting cath patient clamped down and this RN hand was stuck in between her legs and another in her vagina. After a few minutes the patient relaxed enough to remove both hands. Pt family member states he doesn't think we knew where her urethra was. Urethra was visualized, family and patient refuse another attempt. Dr. Roxanne Mins informed.

## 2016-01-20 NOTE — ED Notes (Signed)
Tried to call in [place] to [facility] twice but was told by dispatcher that he was by himself tonight and he was dealing with a sick pt in [place] and would have to call me back.

## 2016-01-20 NOTE — Consult Note (Signed)
Reason for Consult:sepsis Referring Physician: Dr. Jenita Seashore Kalli Greenfield is an 82 y.o. female.  HPI: This patient is status post exploratory laparotomy for small bowel obstruction on October 3. 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 This left a large ventral hernia defect which was repaired with biologic mesh.  She was eventually discharged to a SNF.  She was brought to the office today for a postoperative check. The family reports that she has had failure to thrive with a declining mental status. They report a fever as high as 102 last Thursday. At the office, she was somnolent. She had what appeared to be a large, tender parotid gland on the right side of her face. She has an open abdominal wound which had just a few pieces of gauze in the wound. She otherwise seemed nontender. Because of possible sepsis and decline in her status, I recommend that she be taken to the emergency department.  Past Medical History:  Diagnosis Date  . Arthritis   . CHF (congestive heart failure) ([place])   . Depression   . GERD (gastroesophageal reflux disease)   . Perforated chronic gastric ulcer ([place]) 08/11/2014  . Pneumonia   . Pulmonary arterial hypertension 11/28/2014    Past Surgical History:  Procedure Laterality Date  . ABDOMINAL HYSTERECTOMY    . ABDOMINAL SURGERY    . BACK SURGERY  2000  . COLON SURGERY     x2-blockage  . COLONOSCOPY    . ERCP    . HERNIA REPAIR  2012   x2 with mesh  . JOINT REPLACEMENT Left    shoulder  . LAPAROTOMY N/A 08/04/2014   Procedure: EXPLORATORY LAPAROTOMY, REPAIR OF PERFORATED ULCER;  Surgeon: Excell Seltzer, MD;  Location: [facility];  Service: General;  Laterality: N/A;  . LAPAROTOMY N/A 12/21/2015   Procedure: EXPLORATORY LAPAROTOMY/ REMOVAL OF MESH;  Surgeon: Coralie Keens, MD;  Location: [facility];  Service: General;  Laterality: N/A;  . ORIF WRIST FRACTURE  2012   left  . REVERSE SHOULDER ARTHROPLASTY Right 12/04/2013  . REVERSE  SHOULDER ARTHROPLASTY  12/04/2013   Procedure: REVERSE SHOULDER ARTHROPLASTY;  Surgeon: Nita Sells, MD;  Location: [facility];  Service: Orthopedics;;  Right reverse total shoulder arthroplasty  . ROTATOR CUFF REPAIR     dr Tamera Punt  . SHOULDER ARTHROSCOPY  2012   lt and rt  . TENDON REPAIR Right 04/13/2014   Procedure: RIGHT HAND CENTRAL TENDON CENTRALIZATION OF LONG AND RING FINGERS;  Surgeon: Jolyn Nap, MD;  Location: [facility];  Service: Orthopedics;  Laterality: Right;    Family History  Problem Relation Age of Onset  . Stroke Mother   . Breast cancer Sister   . Ulcerative colitis Daughter   . Chronic fatigue Daughter   . Chronic fatigue Daughter     Social History:  reports that she has never smoked. She has never used smokeless tobacco. She reports that she drinks about 4.2 oz of alcohol per week . She reports that she does not use drugs.  Allergies:  Allergies  Allergen Reactions  . Tape Other (See Comments)    SKIN IS VERY THIN; IT BRUISES AND TEARS EASILY!! -THX    Medications: I have reviewed the patient's current medications.  Results for orders placed or performed during the hospital encounter of 01/20/16 (from the past 48 hour(s))  Comprehensive metabolic panel     Status: Abnormal   Collection Time: 01/20/16  5:18 PM  Result  Value Ref Range   Sodium 131 (L) 135 - 145 mmol/L   Potassium 4.7 3.5 - 5.1 mmol/L   Chloride 92 (L) 101 - 111 mmol/L   CO2 32 22 - 32 mmol/L   Glucose, Bld 109 (H) 65 - 99 mg/dL   BUN 14 6 - 20 mg/dL   Creatinine, Ser 0.62 0.44 - 1.00 mg/dL   Calcium 10.1 8.9 - 10.3 mg/dL   Total Protein 7.2 6.5 - 8.1 g/dL   Albumin 1.9 (L) 3.5 - 5.0 g/dL   AST 25 15 - 41 U/L   ALT 14 14 - 54 U/L   Alkaline Phosphatase 130 (H) 38 - 126 U/L   Total Bilirubin 0.3 0.3 - 1.2 mg/dL   GFR calc non Af Amer >60 >60 mL/min   GFR calc Af Amer >60 >60 mL/min    Comment: (NOTE) The eGFR has been calculated using the CKD EPI  equation. This calculation has not been validated in all clinical situations. eGFR's persistently <60 mL/min signify possible Chronic Kidney Disease.    Anion gap 7 5 - 15  CBC with Differential     Status: Abnormal   Collection Time: 01/20/16  5:18 PM  Result Value Ref Range   WBC 13.8 (H) 4.0 - 10.5 K/uL   RBC 3.43 (L) 3.87 - 5.11 MIL/uL   Hemoglobin 10.2 (L) 12.0 - 15.0 g/dL   HCT 32.6 (L) 36.0 - 46.0 %   MCV 95.0 78.0 - 100.0 fL   MCH 29.7 26.0 - 34.0 pg   MCHC 31.3 30.0 - 36.0 g/dL   RDW 14.7 11.5 - 15.5 %   Platelets 447 (H) 150 - 400 K/uL   Neutrophils Relative % 72 %   Neutro Abs 9.9 (H) 1.7 - 7.7 K/uL   Lymphocytes Relative 16 %   Lymphs Abs 2.3 0.7 - 4.0 K/uL   Monocytes Relative 9 %   Monocytes Absolute 1.2 (H) 0.1 - 1.0 K/uL   Eosinophils Relative 3 %   Eosinophils Absolute 0.4 0.0 - 0.7 K/uL   Basophils Relative 0 %   Basophils Absolute 0.0 0.0 - 0.1 K/uL  CBG monitoring, ED     Status: None   Collection Time: 01/20/16  5:22 PM  Result Value Ref Range   Glucose-Capillary 99 65 - 99 mg/dL  I-Stat beta hCG blood, ED     Status: None   Collection Time: 01/20/16  5:34 PM  Result Value Ref Range   I-stat hCG, quantitative <5.0 <5 mIU/mL   Comment 3            Comment:   GEST. AGE      CONC.  (mIU/mL)   <=1 WEEK        5 - 50     2 WEEKS       50 - 500     3 WEEKS       100 - 10,000     4 WEEKS     1,000 - 30,000        FEMALE AND NON-PREGNANT FEMALE:     LESS THAN 5 mIU/mL   I-Stat CG4 Lactic Acid, ED     Status: None   Collection Time: 01/20/16  5:36 PM  Result Value Ref Range   Lactic Acid, Venous 1.06 0.5 - 1.9 mmol/L  Osmolality, urine     Status: None   Collection Time: 01/20/16 10:00 PM  Result Value Ref Range   Osmolality, Ur 413 300 - 900 mOsm/kg  Osmolality     Status: None   Collection Time: 01/20/16 10:00 PM  Result Value Ref Range   Osmolality 278 275 - 295 mOsm/kg  Urinalysis, Routine w reflex microscopic     Status: Abnormal   Collection  Time: 01/20/16 10:09 PM  Result Value Ref Range   Color, Urine YELLOW YELLOW   APPearance CLOUDY (A) CLEAR   Specific Gravity, Urine 1.015 1.005 - 1.030   pH 7.0 5.0 - 8.0   Glucose, UA NEGATIVE NEGATIVE mg/dL   Hgb urine dipstick NEGATIVE NEGATIVE   Bilirubin Urine NEGATIVE NEGATIVE   Ketones, ur NEGATIVE NEGATIVE mg/dL   Protein, ur NEGATIVE NEGATIVE mg/dL   Nitrite NEGATIVE NEGATIVE   Leukocytes, UA TRACE (A) NEGATIVE  Urine microscopic-add on     Status: Abnormal   Collection Time: 01/20/16 10:09 PM  Result Value Ref Range   Squamous Epithelial / LPF 6-30 (A) NONE SEEN   WBC, UA 6-30 0 - 5 WBC/hpf   RBC / HPF 0-5 0 - 5 RBC/hpf   Bacteria, UA FEW (A) NONE SEEN   Urine-Other YEAST PRESENT     Dg Chest Port 1 View  Result Date: 01/20/2016 CLINICAL DATA:  fever EXAM: PORTABLE CHEST 1 VIEW COMPARISON:  12/25/2015 FINDINGS: Right-sided PICC line has been removed. The heart is enlarged. Stable elevation of right hemidiaphragm. There is interstitial edema. There has been improvement aeration of the right lung base. No new consolidation. IMPRESSION: 1. Cardiomegaly and mild interstitial edema. 2. Improved aeration. Electronically Signed   By: Nolon Nations M.D.   On: 01/20/2016 18:28    Review of Systems  Unable to perform ROS: Mental status change   Blood pressure (!) 120/50, pulse (!) 37, temperature 99.4 F (37.4 C), temperature source Oral, resp. rate 18, height 5' (1.524 m), weight 89.4 kg (197 lb), SpO2 (!) 83 %. Physical Exam  Constitutional: She appears well-developed and well-nourished.  HENT:  Head: Normocephalic and atraumatic.  Right Ear: External ear normal.  Left Ear: External ear normal.  Nose: Nose normal.  Eyes: Pupils are equal, round, and reactive to light. No scleral icterus.  Neck: No tracheal deviation present.  Swollen nodule near the angle of the mandible which is tender with erythema  Cardiovascular: Normal rate, regular rhythm and normal heart  sounds.   Respiratory: Effort normal and breath sounds normal. No respiratory distress.  GI: Soft.  Abdomen is obese. There is a large open wound with visible biologic mesh and some gray fluid but no obvious succus  Neurological:  Somnolent  Skin: Skin is warm and dry. She is not diaphoretic.    Assessment/Plan: Sepsis of uncertain source  I will order twice a day wet-to-dry dressing changes on the midline wound. We may switch this to a wound VAC. A CT scan of the abdomen and pelvis has already been ordered to rule out an intra-abdominal source. We will follow her closely with you.  Calvin Jablonowski A 01/20/2016, 11:12 PM

## 2016-01-20 NOTE — Progress Notes (Signed)
Pharmacy Antibiotic Note Monique Russo is a 82 y.o. female admitted on 01/20/2016 with sepsis.  Pharmacy has been consulted for Zosyn and vancomycin dosing.  Plan: 1. Vancomycin 1250 IV every 24 hours.  Goal trough 15-20 mcg/mL. 2. Zosyn 3.375g IV q8h (4 hour infusion).  3. SCr every 72 hours while on vancomycin   Height: 5' (152.4 cm) Weight: 197 lb (89.4 kg) IBW/kg (Calculated) : 45.5  Temp (24hrs), Avg:100.8 F (38.2 C), Min:100.8 F (38.2 C), Max:100.8 F (38.2 C)   Recent Labs Lab 01/20/16 1718 01/20/16 1736  WBC 13.8*  --   CREATININE 0.62  --   LATICACIDVEN  --  1.06    Estimated Creatinine Clearance: 53.1 mL/min (by C-G formula based on SCr of 0.62 mg/dL).    Allergies  Allergen Reactions  . Tape Other (See Comments)    SKIN IS VERY THIN; IT BRUISES AND TEARS EASILY!! -THX    Antimicrobials this admission:  11/2 Zosyn >>  11/2 vancomycin >>   Dose adjustments this admission:  N/a  Microbiology results:  11/2 BCx: px 11/2 UCx: px    Thank you for allowing pharmacy to be a part of this patient's care.  Vincenza Hews, PharmD, BCPS 01/20/2016, 6:45 PM Pager: [phone]

## 2016-01-20 NOTE — H&P (Signed)
History and Physical  Patient Name: Monique Russo     [id number]    DOB: Oct 09, 1930    DOA: 01/20/2016 PCP: Horatio Pel, MD   Patient coming from: [facility]  Chief Complaint: Fever, altered mental status  HPI: Monique Russo is a 82 y.o. female with a past medical history significant for chronic pain on methadone, chronic non-healing ventral wound with recent SBO requiring ex-lap who presents from SNF after 2 weeks with fever and confusion for >1 week.  At baseline the patient is wheelchair bound because of her chronic back pain, but does not have delirium. Level V caveat that all history collected from the daughter at the bedside because the patient is confused.  The patient was recently admitted for 3 weeks or small bowel obstruction requiring exploratory laparotomy, mesh removal, and complex wound closure on 12/21/2015. History of prolonged postoperative course during which she was treated for hypertension (although without a be due to poor oral intake, HCAP, narcotics), hypomagnesemia, hyponatremia, anemia (baseline 13 g/dL but dropped to 7 g/dL after surgery, required transfusion).  Since discharge, the patient has not even been able to make wheelchair transfers.  Per daughter, even one week ago, the patient was febrile to 101F at the SNF.  She had no cough or complaints of SOB.  She is chronically incontinent and wears Depends.  She has had no new redness, pain or drainage from her wound site.  She has been intermittently confused and thinking she is in [place], not aware that she is at a rehab facility.  Today, she was picked up to go to her surgeon's office, where she was sluggish, confused, and febrile to 100F.  Her wound appeared well, but she was sent to the ER for concern for sepsis.  She noted a painful swelling in her left neck.  ED course: -Febrile 100.39F, heart rate 70s, respirations and pulse ox normal, BP 120/50 (down to lowest 89/60 in ER,  transiently) -Na 131, K 4.7, Cr 0.62, WBC 13.8K, Hgb 10.2 (improved from discharge) -She was given 30 cc/kg bolus and vancomycin and zosyn for presumed sepsis without clear source and TRH were asked to evaluate for admission        ROS: Review of Systems  Unable to perform ROS: Medical condition    Patient is confused.      Past Medical History:  Diagnosis Date  . Arthritis   . CHF (congestive heart failure) ([place])   . Depression   . GERD (gastroesophageal reflux disease)   . Perforated chronic gastric ulcer ([place]) 08/11/2014  . Pneumonia   . Pulmonary arterial hypertension 11/28/2014    Past Surgical History:  Procedure Laterality Date  . ABDOMINAL HYSTERECTOMY    . ABDOMINAL SURGERY    . BACK SURGERY  2000  . COLON SURGERY     x2-blockage  . COLONOSCOPY    . ERCP    . HERNIA REPAIR  2012   x2 with mesh  . JOINT REPLACEMENT Left    shoulder  . LAPAROTOMY N/A 08/04/2014   Procedure: EXPLORATORY LAPAROTOMY, REPAIR OF PERFORATED ULCER;  Surgeon: Excell Seltzer, MD;  Location: [facility];  Service: General;  Laterality: N/A;  . LAPAROTOMY N/A 12/21/2015   Procedure: EXPLORATORY LAPAROTOMY/ REMOVAL OF MESH;  Surgeon: Coralie Keens, MD;  Location: [facility];  Service: General;  Laterality: N/A;  . ORIF WRIST FRACTURE  2012   left  . REVERSE SHOULDER ARTHROPLASTY Right 12/04/2013  . REVERSE SHOULDER ARTHROPLASTY  12/04/2013  Procedure: REVERSE SHOULDER ARTHROPLASTY;  Surgeon: Nita Sells, MD;  Location: [facility];  Service: Orthopedics;;  Right reverse total shoulder arthroplasty  . ROTATOR CUFF REPAIR     dr Tamera Punt  . SHOULDER ARTHROSCOPY  2012   lt and rt  . TENDON REPAIR Right 04/13/2014   Procedure: RIGHT HAND CENTRAL TENDON CENTRALIZATION OF LONG AND RING FINGERS;  Surgeon: Jolyn Nap, MD;  Location: [facility];  Service: Orthopedics;  Laterality: Right;    Social History: Patient lives with her husband at baseline.  The patient is  wheelchair bound permanently from back pain. She is a nonsmoker.  She has been at [facility] discharge on Oct 19  Allergies  Allergen Reactions  . Tape Other (See Comments)    SKIN IS VERY THIN; IT BRUISES AND TEARS EASILY!! -THX    Family history: family history includes Breast cancer in her sister; Chronic fatigue in her daughter and daughter; Stroke in her mother; Ulcerative colitis in her daughter.  Prior to Admission medications   Medication Sig Start Date End Date Taking? Authorizing Provider  acetaminophen (TYLENOL) 325 MG tablet Take 325 mg by mouth every 6 (six) hours as needed for mild pain.  07/10/13  Yes Marianne L York, PA-C  alum & mag hydroxide-simeth (MAALOX/MYLANTA) 200-200-20 MG/5ML suspension Take 15 mLs by mouth as needed for indigestion or heartburn. Patient taking differently: Take 15 mLs by mouth daily as needed for heartburn.  01/06/16  Yes Doreatha Lew, MD  Amino Acids-Protein Hydrolys (PRO-STAT) LIQD Take 30 mLs by mouth 3 (three) times daily. TO PROMOTE WOUND HEALING   Yes Historical Provider, MD  Ascorbic Acid (VITAMIN C) 1000 MG tablet Take 1,000 mg by mouth daily.   Yes Historical Provider, MD  b complex vitamins tablet Take 1 tablet by mouth daily as needed (for supplement).    Yes Historical Provider, MD  bisacodyl (DULCOLAX) 10 MG suppository Place 1 suppository (10 mg total) rectally daily as needed for moderate constipation (May repeat times one). Patient taking differently: Place 10 mg rectally daily as needed for moderate constipation.  07/10/13  Yes Marianne L York, PA-C  budesonide (RHINOCORT AQUA) 32 MCG/ACT nasal spray Place 1 spray into both nostrils daily as needed for rhinitis.   Yes Historical Provider, MD  cholecalciferol (VITAMIN D) 1000 UNITS tablet Take 1,000 Units by mouth daily.   Yes Historical Provider, MD  diclofenac sodium (VOLTAREN) 1 % GEL Apply topically every three (3) days as needed (For Leg pain).    Yes Historical  Provider, MD  DULoxetine (CYMBALTA) 60 MG capsule Take 60 mg by mouth at bedtime.    Yes Historical Provider, MD  Esomeprazole Magnesium (NEXIUM 24HR PO) Take 22.3 mg by mouth daily.   Yes Historical Provider, MD  furosemide (LASIX) 80 MG tablet Take 80 mg by mouth daily as needed for fluid.    Yes Historical Provider, MD  gabapentin (NEURONTIN) 800 MG tablet Take 800 mg by mouth 2 (two) times daily. 09/11/15  Yes Historical Provider, MD  LORazepam (ATIVAN) 1 MG tablet Take 1 tablet (1 mg total) by mouth every 4 (four) hours as needed for anxiety. 12/30/15  Yes Reyne Dumas, MD  Magnesium 400 MG TABS Take 400 mg by mouth daily.   Yes Historical Provider, MD  magnesium hydroxide (MILK OF MAGNESIA) 400 MG/5ML suspension Take 30 mLs by mouth every other day. FOR CONSTIPATION   Yes Historical Provider, MD  methadone (DOLOPHINE) 5 MG tablet Take 5 mg  by mouth every 8 (eight) hours. FOR CHRONIC PAIN (0600, 1400, 2200 per [facility])   Yes Historical Provider, MD  methocarbamol (ROBAXIN) 750 MG tablet Take 750 mg by mouth every 8 (eight) hours as needed for muscle spasms.    Yes Historical Provider, MD  Morphine Sulfate (MORPHINE CONCENTRATE) 10 MG/0.5ML SOLN concentrated solution Take 0.25 mLs (5 mg total) by mouth every 2 (two) hours as needed for severe pain. Patient taking differently: Take 5 mg by mouth every 2 (two) hours as needed (for pain).  12/30/15  Yes Reyne Dumas, MD  Multiple Vitamins-Iron (MULTIVITAMINS WITH IRON) TABS tablet Take 1 tablet by mouth daily with supper. 08/12/14  Yes Earnstine Regal, PA-C  Omega-3 Fatty Acids (FISH OIL) 1000 MG CPDR Take 1,000 mg by mouth daily.    Yes Historical Provider, MD  oxyCODONE (ROXICODONE) 15 MG immediate release tablet Take 15 mg by mouth every 8 (eight) hours as needed for pain. 12/03/15  Yes Historical Provider, MD  polyethylene glycol (MIRALAX / GLYCOLAX) packet Take 17 g by mouth daily as needed for mild constipation.   Yes Historical Provider, MD    potassium chloride SA (K-DUR,KLOR-CON) 20 MEQ tablet Take 20 mEq by mouth daily as needed (Only takes with lasix).   Yes Historical Provider, MD  timolol (BETIMOL) 0.5 % ophthalmic solution Place 1 drop into both eyes 2 (two) times daily.    Yes Historical Provider, MD  vitamin E 400 UNIT capsule Take 800 Units by mouth daily.    Yes Historical Provider, MD  lactose free nutrition (BOOST PLUS) LIQD Take 237 mLs by mouth 2 (two) times daily between meals. Patient not taking: Reported on 01/20/2016 08/12/14   Earnstine Regal, PA-C       Physical Exam: BP 113/56   Pulse 88   Temp 100.8 F (38.2 C) (Rectal)   Resp 22   Ht 5' (1.524 m)   Wt 89.4 kg (197 lb)   SpO2 98%   BMI 38.47 kg/m  General appearance: Chcronically ill appearing elderly adult obese female, awake and responding to questions, in mild distress from malaise and waekness.   Eyes: Anicteric, conjunctiva pink, lids and lashes normal. PERRL.    ENT: No nasal deformity, discharge, epistaxis.  Hearing normal. OP tacky dry without lesions.   Neck: Tenderness and swelling in the left neck, parotid?, lateral to the jawline without redness Trachea midline.  No thyromegaly/tenderness. Lymph: No cervical or supraclavicular lymphadenopathy. Skin: Warm and dry.  No jaundice.  No suspicious rashes or lesions.  The wound is without purulent drainage.  Wound vac has been taken off. Cardiac: RRR, nl S1-S2, no murmurs appreciated.  Capillary refill is brisk. No LE edema.  Radial pulses 2+ and symmetric. Respiratory: Normal respiratory rate and rhythm.  CTAB without rales or wheezes. Abdomen: Abdomen with severe diffuse nonfocal TTP, voluntary guarding. No ascites.  Cannot appreciate distension.   MSK: No deformities or effusions.  No cyanosis or clubbing. Neuro: Cranial nerves unable to assess because of patient difficulty following commands.  Appears globally weak, upper extremity strength and lower extremity strength symmetric but 4-/5.   Speech is fluent.     Psych: Sensorium confused.  Oriented to month but not time of day, not oriented to place.  Oriented to "[facility]" and self.  Attention diminished.  Affect: in pain.     Labs on Admission:  I have personally reviewed following labs and imaging studies: CBC:  Recent Labs Lab 01/20/16 1718  WBC 13.8*  NEUTROABS  9.9*  HGB 10.2*  HCT 32.6*  MCV 95.0  PLT 99991111*   Basic Metabolic Panel:  Recent Labs Lab 01/20/16 1718  NA 131*  K 4.7  CL 92*  CO2 32  GLUCOSE 109*  BUN 14  CREATININE 0.62  CALCIUM 10.1   GFR: Estimated Creatinine Clearance: 53.1 mL/min (by C-G formula based on SCr of 0.62 mg/dL).  Liver Function Tests:  Recent Labs Lab 01/20/16 1718  AST 25  ALT 14  ALKPHOS 130*  BILITOT 0.3  PROT 7.2  ALBUMIN 1.9*   No results for input(s): LIPASE, AMYLASE in the last 168 hours. No results for input(s): AMMONIA in the last 168 hours. Coagulation Profile: No results for input(s): INR, PROTIME in the last 168 hours. Cardiac Enzymes: No results for input(s): CKTOTAL, CKMB, CKMBINDEX, TROPONINI in the last 168 hours. BNP (last 3 results) No results for input(s): PROBNP in the last 8760 hours. HbA1C: No results for input(s): HGBA1C in the last 72 hours. CBG:  Recent Labs Lab 01/20/16 1722  GLUCAP 99   Lipid Profile: No results for input(s): CHOL, HDL, LDLCALC, TRIG, CHOLHDL, LDLDIRECT in the last 72 hours. Thyroid Function Tests: No results for input(s): TSH, T4TOTAL, FREET4, T3FREE, THYROIDAB in the last 72 hours. Anemia Panel: No results for input(s): VITAMINB12, FOLATE, FERRITIN, TIBC, IRON, RETICCTPCT in the last 72 hours. Sepsis Labs: Lactic acid 1.06 Invalid input(s): PROCALCITONIN, LACTICIDVEN No results found for this or any previous visit (from the past 240 hour(s)).       Radiological Exams on Admission: Personally reviewed CXR shows bilateral opacities, improved right side from before, but still bilateral: Dg  Chest Port 1 View  Result Date: 01/20/2016 CLINICAL DATA:  fever EXAM: PORTABLE CHEST 1 VIEW COMPARISON:  12/25/2015 FINDINGS: Right-sided PICC line has been removed. The heart is enlarged. Stable elevation of right hemidiaphragm. There is interstitial edema. There has been improvement aeration of the right lung base. No new consolidation. IMPRESSION: 1. Cardiomegaly and mild interstitial edema. 2. Improved aeration. Electronically Signed   By: Nolon Nations M.D.   On: 01/20/2016 18:28    EKG: Independently reviewed. Rate 80, QTc 413.  No ST changes.    Assessment/Plan  1. Sepsis, presumed:  The patient presents with fever and leukocytosis.  Suspected source unclear.  Favor pneumonia.  Urine unable to be obtained in ER.  ?intraabdominal abscess given suspected diverticulitis during last hospitalization vs parotitis. Organism unknown. Patient meets criteria given tachypnea, fever, leukocytosis, and evidence of organ dysfunction (AMS).  Lactate 1.06 mmol/L.  This patient is at high risk of poor outcomes with a qSOFA score of 2.  Antibiotics delivered in the ED.    -Sepsis bundle utilized:  -Blood and urine cultures drawn  -30 ml/kg bolus given in ED, will repeat lactic acid  -Start targeted antibiotics with vancomycin and zosyn, based on suspected source of infection    -Repeat renal function and complete blood count in AM  -Code SEPSIS called to E-link  -Obtain CT adbomen and pelvis with contrast -Obtain CT soft tissues neck with contrast       2. Hyponatremia:  Mild.  Was 134 at discharge. -Check urine osmo, Serum osmo, urine Na  3. Anemia:  This chronic, stable. Baseline in 2016 was 9-12 and macrocytic.  Currently normocytic, as low as 7.2 g/dL during last hospitalization, transfused at that time.    4. Chronic pain:  -Continue methadone, but decreased from 5 mg TID to BID until mental status clearer -Continue Cymbalta -Continue  oxycodone PRN but decrease to 5 mg PRN from 15  mg PRN -Hold lorazepam, Robaxin until mental status clearer -Continue Gabapentin, but reduce dose to 400 mg BID from 800 mg BID until mental status clearer -Hold PRN morphine until mental status clearer  5. Malnutrition of severe degree:  -Continue Pro-stat -Continue Boost  6. Chronic diastolic CHF:  Appears euvolemic at exam but bilateral opacities on CXR, ?edema.  Got 3L on admission.   -Monitor I/Os -Hold furosemide until hemodynamics clearer -Follow procalcitonin      DVT prophylaxis: Lovenox  Code Status: DO NOT RESUSCITATE  Family Communication: Husband and daughter at bedside.  Differential discussed.  Overnight plan discussed.  Disposition Plan: Anticipate fluids and antibiotics and follow imaging studies.   Consults called: None Admission status: INPATIENT, med surg        Medical decision making: Patient seen at 7:30 PM on 01/20/2016.  The patient was discussed with Dr. Roxanne Mins.  What exists of the patient's chart was reviewed in depth and summarized above.  Clinical condition: BP stable at present.        Edwin Dada [facility] Pager [phone]     At the time of admission, it appears that the appropriate admission status for this patient is INPATIENT. This is judged to be reasonable and necessary in order to provide the required intensity of service to ensure the patient's safety given the presenting symptoms, physical exam findings, and initial radiographic and laboratory data in the context of their chronic comorbidities.  Together, these circumstances are felt to place her/him at high risk for further clinical deterioration threatening life, limb, or organ.  I certify that at the point of admission it is my clinical judgment that the patient will require inpatient hospital care spanning beyond 2 midnights from the point of admission and that early discharge would result in unnecessary risk of decompensation and readmission or threat to life,  limb or bodily function.

## 2016-01-20 NOTE — ED Notes (Signed)
CBG- 99 

## 2016-01-20 NOTE — ED Notes (Signed)
Tried to call in [place] to [facility] twice but was told by dispatcher that he was by himself tonight and he was dealing with a sick pt in [place] and would have to call me back.

## 2016-01-21 ENCOUNTER — Encounter (HOSPITAL_COMMUNITY): Payer: Self-pay | Admitting: *Deleted

## 2016-01-21 ENCOUNTER — Inpatient Hospital Stay (HOSPITAL_COMMUNITY): Payer: PPO

## 2016-01-21 DIAGNOSIS — E871 Hypo-osmolality and hyponatremia: Secondary | ICD-10-CM

## 2016-01-21 DIAGNOSIS — K1120 Sialoadenitis, unspecified: Secondary | ICD-10-CM | POA: Diagnosis not present

## 2016-01-21 DIAGNOSIS — R1084 Generalized abdominal pain: Secondary | ICD-10-CM | POA: Diagnosis not present

## 2016-01-21 DIAGNOSIS — G894 Chronic pain syndrome: Secondary | ICD-10-CM | POA: Diagnosis not present

## 2016-01-21 DIAGNOSIS — K5730 Diverticulosis of large intestine without perforation or abscess without bleeding: Secondary | ICD-10-CM | POA: Diagnosis not present

## 2016-01-21 DIAGNOSIS — A419 Sepsis, unspecified organism: Secondary | ICD-10-CM | POA: Diagnosis not present

## 2016-01-21 DIAGNOSIS — K111 Hypertrophy of salivary gland: Secondary | ICD-10-CM | POA: Diagnosis not present

## 2016-01-21 DIAGNOSIS — R5383 Other fatigue: Secondary | ICD-10-CM | POA: Diagnosis not present

## 2016-01-21 LAB — CBC
HCT: 27.6 % — ABNORMAL LOW (ref 36.0–46.0)
HEMOGLOBIN: 8.7 g/dL — AB (ref 12.0–15.0)
MCH: 29.8 pg (ref 26.0–34.0)
MCHC: 31.5 g/dL (ref 30.0–36.0)
MCV: 94.5 fL (ref 78.0–100.0)
Platelets: 414 10*3/uL — ABNORMAL HIGH (ref 150–400)
RBC: 2.92 MIL/uL — AB (ref 3.87–5.11)
RDW: 14.6 % (ref 11.5–15.5)
WBC: 12.4 10*3/uL — ABNORMAL HIGH (ref 4.0–10.5)

## 2016-01-21 LAB — BASIC METABOLIC PANEL
ANION GAP: 7 (ref 5–15)
BUN: 11 mg/dL (ref 6–20)
CALCIUM: 9.3 mg/dL (ref 8.9–10.3)
CO2: 28 mmol/L (ref 22–32)
Chloride: 98 mmol/L — ABNORMAL LOW (ref 101–111)
Creatinine, Ser: 0.55 mg/dL (ref 0.44–1.00)
Glucose, Bld: 109 mg/dL — ABNORMAL HIGH (ref 65–99)
POTASSIUM: 4 mmol/L (ref 3.5–5.1)
Sodium: 133 mmol/L — ABNORMAL LOW (ref 135–145)

## 2016-01-21 LAB — PROCALCITONIN

## 2016-01-21 MED ORDER — POLYETHYLENE GLYCOL 3350 17 G PO PACK
17.0000 g | PACK | Freq: Every day | ORAL | Status: DC
  Administered 2016-01-21 – 2016-01-22 (×2): 17 g via ORAL
  Filled 2016-01-21 (×2): qty 1

## 2016-01-21 MED ORDER — SENNA 8.6 MG PO TABS
2.0000 | ORAL_TABLET | Freq: Every day | ORAL | Status: DC
  Administered 2016-01-21 – 2016-01-23 (×2): 17.2 mg via ORAL
  Filled 2016-01-21 (×3): qty 2

## 2016-01-21 NOTE — Progress Notes (Signed)
[facility] Progress Note     Subjective: Somnolent but arouses to loud voice. Oriented. Pain controlled this AM.  Denies chills. +flatus. Denies BM.  Objective: Vital signs in last 24 hours: Temp:  [98.7 F (37.1 C)-100.8 F (38.2 C)] 98.7 F (37.1 C) (11/03 0614) Pulse Rate:  [37-88] 73 (11/03 0539) Resp:  [13-22] 18 (11/03 0539) BP: (89-125)/(41-66) 99/41 (11/03 0539) SpO2:  [83 %-99 %] 91 % (11/03 0539) Weight:  [197 lb (89.4 kg)] 197 lb (89.4 kg) (11/02 1709)    Intake/Output from previous day: 11/02 0701 - 11/03 0700 In: 100 [IV Piggyback:100] Out: 150 [Urine:150] Intake/Output this shift: Total I/O In: -  Out: 200 [Urine:200]  PE: Gen:  Somnolent, oriented to person, place, time  HEENT: head atraumatic  Pulm:  CTABL with decreased breath sounds at lung base  Abd: Soft, obese, ND +BS, large open midline wound with biologic mesh visible at wound base. Thin gray/yellow fluid on kerlix.   Lab Results:   Recent Labs  01/20/16 1718 01/21/16 0322  WBC 13.8* 12.4*  HGB 10.2* 8.7*  HCT 32.6* 27.6*  PLT 447* 414*   BMET  Recent Labs  01/20/16 1718 01/21/16 0322  NA 131* 133*  K 4.7 4.0  CL 92* 98*  CO2 32 28  GLUCOSE 109* 109*  BUN 14 11  CREATININE 0.62 0.55  CALCIUM 10.1 9.3   CMP     Component Value Date/Time   NA 133 (L) 01/21/2016 0322   K 4.0 01/21/2016 0322   CL 98 (L) 01/21/2016 0322   CO2 28 01/21/2016 0322   GLUCOSE 109 (H) 01/21/2016 0322   BUN 11 01/21/2016 0322   CREATININE 0.55 01/21/2016 0322   CALCIUM 9.3 01/21/2016 0322   PROT 7.2 01/20/2016 1718   ALBUMIN 1.9 (L) 01/20/2016 1718   AST 25 01/20/2016 1718   ALT 14 01/20/2016 1718   ALKPHOS 130 (H) 01/20/2016 1718   BILITOT 0.3 01/20/2016 1718   GFRNONAA >60 01/21/2016 0322   GFRAA >60 01/21/2016 0322    Studies/Results: Ct Soft Tissue Neck W Contrast  Result Date: 01/21/2016 CLINICAL DATA:  82 y/o  F; enlarged right parotid gland with sepsis. EXAM: CT NECK  WITH CONTRAST TECHNIQUE: Multidetector CT imaging of the neck was performed using the standard protocol following the bolus administration of intravenous contrast. CONTRAST:  1106mL ISOVUE-300 IOPAMIDOL (ISOVUE-300) INJECTION 61% COMPARISON:  100 cc Isovue-300 FINDINGS: Pharynx and larynx: Normal. No mass or swelling. Salivary glands: There is asymmetric enhancement of the right parotid gland both superficial and deep lobes and ill-defined pattern and mild surrounding inflammatory changes of the subcutaneous fat compatible with parotiditis. Thyroid: Normal. Lymph nodes: None enlarged or abnormal density. Vascular: Mild calcific atherosclerosis of carotid bifurcations without significant stenosis. Brief retropharyngeal submucosal course of right internal carotid artery. Carotid and vertebral arteries of the neck as well as internal jugular veins are patent. Atherosclerosis of aortic arch with moderate calcification. Limited intracranial: Negative. Visualized orbits: Negative. Mastoids and visualized paranasal sinuses: Clear. Under pneumatized mastoid air cells probably represents sequelae of chronic otomastoiditis. Skeleton: No acute osseous abnormality is evident. Multilevel cervical degenerative changes with both disc osteophyte complexes and facet arthrosis. Grade 1 C3-4 retrolisthesis and C4-5 anterolisthesis on a degenerative basis. Multilevel mild to moderate bony foraminal narrowing. At least moderate canal stenosis at the C3-4 level. Upper chest: Small right-sided pleural effusion and dependent opacity which may represent atelectasis or pneumonia, partially visualized. Other: None. IMPRESSION: 1. Findings consistent with right parotiditis.  No abscess identified. No significant lymphadenopathy. 2. Partially visualized small right pleural effusion and dependent right upper lobe opacity which may represent associated pneumonia or atelectasis. Electronically Signed   By: Kristine Garbe M.D.   On:  01/21/2016 02:28   Ct Abdomen Pelvis W Contrast  Result Date: 01/21/2016 CLINICAL DATA:  Acute onset of fever. Failure to thrive and somnolence. Question of sepsis. Initial encounter. EXAM: CT ABDOMEN AND PELVIS WITH CONTRAST TECHNIQUE: Multidetector CT imaging of the abdomen and pelvis was performed using the standard protocol following bolus administration of intravenous contrast. CONTRAST:  168mL ISOVUE-300 IOPAMIDOL (ISOVUE-300) INJECTION 61% COMPARISON:  CT of the abdomen and pelvis from 12/18/2015 FINDINGS: Lower chest: Small bilateral pleural effusions are noted. Bibasilar airspace opacities may reflect atelectasis or possibly infection. The visualized portions of the mediastinum are unremarkable in appearance. Mild calcification noted at the aortic valve. A large right-sided hiatal hernia is noted, containing most of the stomach. Hepatobiliary: Mild soft tissue inflammation about the gallbladder is nonspecific, given the soft tissue inflammation about the abdomen related to the patient's large postoperative defect. The liver is unremarkable in appearance. The common bile duct is dilated to 1.4 cm, of uncertain significance. Pancreas: The pancreas is within normal limits. Spleen: The spleen is unremarkable in appearance. Adrenals/Urinary Tract: The adrenal glands are unremarkable in appearance. The kidneys are within normal limits. There is no evidence of hydronephrosis. No renal or ureteral stones are identified. No perinephric stranding is seen. Stomach/Bowel: Postoperative change is noted at the right lower quadrant. Mild soft tissue inflammation is suggested about the transverse colon. The colon is largely filled with stool. Scattered diverticulosis is noted along the distal sigmoid colon. Apparent wall thickening at the rectum could reflect mild proctitis. Mild presacral stranding is noted. Visualized small bowel loops are grossly unremarkable. The stomach is otherwise unremarkable.  Vascular/Lymphatic: Scattered calcification is seen along the abdominal aorta and its branches. The abdominal aorta is otherwise grossly unremarkable. The inferior vena cava is grossly unremarkable. No retroperitoneal lymphadenopathy is seen. No pelvic sidewall lymphadenopathy is identified. Reproductive: The bladder is mildly distended. Mild soft tissue inflammation is suggested about the bladder. The patient is status post hysterectomy. No suspicious adnexal masses are seen. Other: A very large anterior abdominal wall defect is noted, with packing material and underlying soft tissue thickening. Musculoskeletal: No acute osseous abnormalities are identified. Right convex thoracolumbar scoliosis is noted. The visualized musculature is unremarkable in appearance. IMPRESSION: 1. Apparent wall thickening at the rectum could reflect mild proctitis. 2. Small bilateral pleural effusions noted. Bibasilar airspace opacities may reflect atelectasis or possibly infection. 3. Large right-sided hiatal hernia, containing most of the stomach. 4. Mild soft tissue inflammation suggested about the bladder. Would correlate for any evidence of cystitis. 5. Nonspecific soft tissue inflammation suggested about the transverse colon. 6. Mild inflammation about the gallbladder is nonspecific, given the underlying diffuse soft tissue inflammation about the abdomen. 7. Very large anterior abdominal wall defect again noted, with packing material and underlying soft tissue thickening. 8. Scattered diverticulosis along the distal sigmoid colon, without evidence of diverticulitis. 9. Scattered aortic atherosclerosis noted. 10. Right convex thoracolumbar scoliosis noted. Electronically Signed   By: Garald Balding M.D.   On: 01/21/2016 02:35   Dg Chest Port 1 View  Result Date: 01/20/2016 CLINICAL DATA:  fever EXAM: PORTABLE CHEST 1 VIEW COMPARISON:  12/25/2015 FINDINGS: Right-sided PICC line has been removed. The heart is enlarged. Stable  elevation of right hemidiaphragm. There is interstitial edema. There has been  improvement aeration of the right lung base. No new consolidation. IMPRESSION: 1. Cardiomegaly and mild interstitial edema. 2. Improved aeration. Electronically Signed   By: Nolon Nations M.D.   On: 01/20/2016 18:28   Anti-infectives: Anti-infectives    Start     Dose/Rate Route Frequency Ordered Stop   01/21/16 0500  vancomycin (VANCOCIN) 1,250 mg in sodium chloride 0.9 % 250 mL IVPB     1,250 mg 166.7 mL/hr over 90 Minutes Intravenous Every 24 hours 01/20/16 1843     01/21/16 0300  piperacillin-tazobactam (ZOSYN) IVPB 3.375 g     3.375 g 12.5 mL/hr over 240 Minutes Intravenous Every 8 hours 01/20/16 1842     01/20/16 1745  piperacillin-tazobactam (ZOSYN) IVPB 3.375 g     3.375 g 100 mL/hr over 30 Minutes Intravenous  Once 01/20/16 1735 01/20/16 1833   01/20/16 1745  vancomycin (VANCOCIN) IVPB 1000 mg/200 mL premix     1,000 mg 200 mL/hr over 60 Minutes Intravenous  Once 01/20/16 1735 01/20/16 1903     Assessment/Plan S/p exploratory laparotomy, removal of mesh, placement of Phasix mesh, 12/21/15 douglas blackman, MD  CT 01/20/16 not significant for inta-abdominal source of infection  BID wet-to-dry dressing changes to midline wound  WOC consult - ? Possible replacement of VAC; last hospital admission receiving mepitel to protect mesh,1  piece of white  foam, 1 piece of black foam, suction to 195mm/hg   Sepsis Hyponatremia Chronic pain Severe malnutrition Chronic diastolic CHF  FEN: Regular diet, Boost, Pro-stat  ID: Zosyn 11/2 >>, Vanc 11/2 >> VTE: lovenox, SCD's  Patient is DNR    LOS: 1 day    Jill Alexanders , [facility] 01/21/2016, 8:38 AM Pager: [phone] Consults: [phone] Mon-Fri 7:00 am-4:30 pm Sat-Sun 7:00 am-11:30 am

## 2016-01-21 NOTE — Consult Note (Addendum)
[place] Nurse wound consult note Pt is familiar to [facility] team from previous admission, she is followed by the surgical team as an outpatient. Reason for Consult: Requested to re-apply Vac dressing Wound type: Chronic full thickness post-op wound to midline abd Measurement: 10X12.5X1cm with undermining to entire wound edges with greatest depth 6.5cm at 8:00 o'clock Wound bed: Granulation tissue visible through mesh, which is beginning to lift away from wound bed Drainage (amount, consistency, odor) Small amt yellow drainage, no odor Periwound: Intact skin surrounding Dressing procedure/placement/frequency: Applied Mepitel contact layer to protect mesh, then one piece of white foam, then black foam to 149mm cont suction.  Pt tolerated without discomfort. WOC will change Vac dressing Q M/W/F while patient is in the hospital. Julien Girt MSN, RN, [place], [place], [place]

## 2016-01-21 NOTE — Progress Notes (Signed)
PROGRESS NOTE        PATIENT DETAILS Name: Monique Russo Age: 82 y.o. Sex: female Date of Birth: [birth year] Admit Date: 01/20/2016 Admitting Physician Edwin Dada, MD [id number] DAVIDSON, MD  Brief Narrative: Patient is a 82 y.o. female with a past medical history significant for chronic pain on methadone, chronic non-healing ventral wound with recent SBO requiring ex-lap who presents from SNF for evaluation of sepsis along with acute encephalopathy and left-sided parotitis.  Subjective: Seems to have improved-is awake and mostly alert. Sister at bedside acknowledges improvement in her mentation.  Assessment/Plan: Sepsis: Improved, sepsis pathophysiology has essentially resolved. Likely etiology is right-sided parotitis. CT of the abdomen negative for any acute abnormalities/infectious sources. Continue empiric antibiotics, await culture data.  Right-sided parotitis: Probably causing above. CT scan of neck does not show any abscess. Continue empiric antibiotics, or if any signs of abscess develops we will consult ENT.  s/p exploratory recent laparotomy, removal of mesh, placement of Phasix mesh, 12/21/15: General surgery following-and defer to general surgery. As noted above CT scan of the abdomen negative for any acute abnormality/sources of infection.  Hyponatremia: Very mild, doubt any need for workup at this time. Follow periodically, if worsen significantly then we can consider further workup  Anemia: Appears to have anemia secondary to chronic disease-likely worsened by acute illness. No evidence of bleeding at this time-follow hemoglobin periodically.  Chronic diastolic heart failure: Clinically compensated, follow weights-resume Lasix over the next few days.  Chronic pain syndrome: Continue methadone, Cymbalta, Neurontin and oxycodone-these are  at decreased doses from her usual dosing-if her mentation continues to improve we will  slowly resume these medications at her usual dose over the next few days. Start scheduled MiraLAX/Senokot - bowel regimen.  GERD: Continue PPI  History of dysphagia: Recently was seen by speech therapy during her most recent admission-recommendations were for a dysphagia 3 diet-l restart dysphagia 3 diet.  Malnutrition of severe degree: Continue supplements  DVT Prophylaxis: Prophylactic Lovenox   Code Status:  DNR  Family Communication: Sister at bedside  Disposition Plan: Remain inpatient-back to SNF on discharge in next 2-3 days  Antimicrobial agents: See below  Procedures: None  CONSULTS: CCS  Time spent: 30 minutes-Greater than 50% of this time was spent in counseling, explanation of diagnosis, planning of further management, and coordination of care.  MEDICATIONS: Anti-infectives    Start     Dose/Rate Route Frequency Ordered Stop   01/21/16 0500  vancomycin (VANCOCIN) 1,250 mg in sodium chloride 0.9 % 250 mL IVPB     1,250 mg 166.7 mL/hr over 90 Minutes Intravenous Every 24 hours 01/20/16 1843     01/21/16 0300  piperacillin-tazobactam (ZOSYN) IVPB 3.375 g     3.375 g 12.5 mL/hr over 240 Minutes Intravenous Every 8 hours 01/20/16 1842     01/20/16 1745  piperacillin-tazobactam (ZOSYN) IVPB 3.375 g     3.375 g 100 mL/hr over 30 Minutes Intravenous  Once 01/20/16 1735 01/20/16 1833   01/20/16 1745  vancomycin (VANCOCIN) IVPB 1000 mg/200 mL premix     1,000 mg 200 mL/hr over 60 Minutes Intravenous  Once 01/20/16 1735 01/20/16 1903      Scheduled Meds: . DULoxetine  60 mg Oral QHS  . enoxaparin (LOVENOX) injection  40 mg Subcutaneous Q24H  . esomeprazole  20 mg Oral Daily  . feeding supplement (PRO-STAT  SUGAR FREE 64)  30 mL Oral TID  . gabapentin  400 mg Oral BID  . lactose free nutrition  237 mL Oral BID BM  . magnesium hydroxide  30 mL Oral QODAY  . magnesium oxide  400 mg Oral Daily  . methadone  5 mg Oral Q12H  . piperacillin-tazobactam (ZOSYN)  IV   3.375 g Intravenous Q8H  . timolol  1 drop Both Eyes BID  . vancomycin  1,250 mg Intravenous Q24H   Continuous Infusions:  PRN Meds:.acetaminophen **OR** acetaminophen, bisacodyl, diclofenac sodium, ondansetron **OR** ondansetron (ZOFRAN) IV, oxyCODONE, polyethylene glycol   PHYSICAL EXAM: Vital signs: Vitals:   01/20/16 1930 01/20/16 2105 01/21/16 0539 01/21/16 0614  BP: 113/56 (!) 120/50 (!) 99/41   Pulse: 88 (!) 37 73   Resp: 22 18 18    Temp:  99.4 F (37.4 C) 100.3 F (37.9 C) 98.7 F (37.1 C)  TempSrc:  Oral Oral   SpO2: 98% (!) 83% 91%   Weight:      Height:       Filed Weights   01/20/16 1709  Weight: 89.4 kg (197 lb)   Body mass index is 38.47 kg/m.   General appearance :Awake, alert, not in any distress. Speech Clear. Not toxic Looking Eyes:, pupils equally reactive to light and accomodation,no scleral icterus.Pink conjunctiva HEENT: Atraumatic and Normocephalic Neck: supple, no JVD. Small nodular area just below the right mandibular angle-moderately tender.. No thyromegaly Resp:Good air entry bilaterally, no added sounds  CVS: S1 S2 regular, no murmurs.  GI: Bowel sounds present, Non tender and not distended with no gaurding, rigidity or rebound.No organomegaly. Dressing in place (did not open) Extremities: B/L Lower Ext shows trace edema, both legs are warm to touch Neurology:  speech clear,Non focal, sensation is grossly intact. Psychiatric:  Alert and oriented x 3. Normal mood. Musculoskeletal:No digital cyanosis Skin:No Rash, warm and dry Wounds:N/A  I have personally reviewed following labs and imaging studies  LABORATORY DATA: CBC:  Recent Labs Lab 01/20/16 1718 01/21/16 0322  WBC 13.8* 12.4*  NEUTROABS 9.9*  --   HGB 10.2* 8.7*  HCT 32.6* 27.6*  MCV 95.0 94.5  PLT 447* 414*    Basic Metabolic Panel:  Recent Labs Lab 01/20/16 1718 01/21/16 0322  NA 131* 133*  K 4.7 4.0  CL 92* 98*  CO2 32 28  GLUCOSE 109* 109*  BUN 14 11    CREATININE 0.62 0.55  CALCIUM 10.1 9.3    GFR: Estimated Creatinine Clearance: 53.1 mL/min (by C-G formula based on SCr of 0.55 mg/dL).  Liver Function Tests:  Recent Labs Lab 01/20/16 1718  AST 25  ALT 14  ALKPHOS 130*  BILITOT 0.3  PROT 7.2  ALBUMIN 1.9*   No results for input(s): LIPASE, AMYLASE in the last 168 hours. No results for input(s): AMMONIA in the last 168 hours.  Coagulation Profile: No results for input(s): INR, PROTIME in the last 168 hours.  Cardiac Enzymes: No results for input(s): CKTOTAL, CKMB, CKMBINDEX, TROPONINI in the last 168 hours.  BNP (last 3 results) No results for input(s): PROBNP in the last 8760 hours.  HbA1C: No results for input(s): HGBA1C in the last 72 hours.  CBG:  Recent Labs Lab 01/20/16 1722  GLUCAP 99    Lipid Profile: No results for input(s): CHOL, HDL, LDLCALC, TRIG, CHOLHDL, LDLDIRECT in the last 72 hours.  Thyroid Function Tests: No results for input(s): TSH, T4TOTAL, FREET4, T3FREE, THYROIDAB in the last 72 hours.  Anemia Panel: No  results for input(s): VITAMINB12, FOLATE, FERRITIN, TIBC, IRON, RETICCTPCT in the last 72 hours.  Urine analysis:    Component Value Date/Time   COLORURINE YELLOW 01/20/2016 2209   APPEARANCEUR CLOUDY (A) 01/20/2016 2209   LABSPEC 1.015 01/20/2016 2209   PHURINE 7.0 01/20/2016 2209   GLUCOSEU NEGATIVE 01/20/2016 2209   HGBUR NEGATIVE 01/20/2016 2209   BILIRUBINUR NEGATIVE 01/20/2016 2209   KETONESUR NEGATIVE 01/20/2016 2209   PROTEINUR NEGATIVE 01/20/2016 2209   UROBILINOGEN 0.2 08/04/2014 2100   NITRITE NEGATIVE 01/20/2016 2209   LEUKOCYTESUR TRACE (A) 01/20/2016 2209    Sepsis Labs: Lactic Acid, Venous    Component Value Date/Time   LATICACIDVEN 1.06 01/20/2016 1736    MICROBIOLOGY: No results found for this or any previous visit (from the past 240 hour(s)).  RADIOLOGY STUDIES/RESULTS: Ct Soft Tissue Neck W Contrast  Result Date: 01/21/2016 CLINICAL DATA:  82  y/o  F; enlarged right parotid gland with sepsis. EXAM: CT NECK WITH CONTRAST TECHNIQUE: Multidetector CT imaging of the neck was performed using the standard protocol following the bolus administration of intravenous contrast. CONTRAST:  171mL ISOVUE-300 IOPAMIDOL (ISOVUE-300) INJECTION 61% COMPARISON:  100 cc Isovue-300 FINDINGS: Pharynx and larynx: Normal. No mass or swelling. Salivary glands: There is asymmetric enhancement of the right parotid gland both superficial and deep lobes and ill-defined pattern and mild surrounding inflammatory changes of the subcutaneous fat compatible with parotiditis. Thyroid: Normal. Lymph nodes: None enlarged or abnormal density. Vascular: Mild calcific atherosclerosis of carotid bifurcations without significant stenosis. Brief retropharyngeal submucosal course of right internal carotid artery. Carotid and vertebral arteries of the neck as well as internal jugular veins are patent. Atherosclerosis of aortic arch with moderate calcification. Limited intracranial: Negative. Visualized orbits: Negative. Mastoids and visualized paranasal sinuses: Clear. Under pneumatized mastoid air cells probably represents sequelae of chronic otomastoiditis. Skeleton: No acute osseous abnormality is evident. Multilevel cervical degenerative changes with both disc osteophyte complexes and facet arthrosis. Grade 1 C3-4 retrolisthesis and C4-5 anterolisthesis on a degenerative basis. Multilevel mild to moderate bony foraminal narrowing. At least moderate canal stenosis at the C3-4 level. Upper chest: Small right-sided pleural effusion and dependent opacity which may represent atelectasis or pneumonia, partially visualized. Other: None. IMPRESSION: 1. Findings consistent with right parotiditis. No abscess identified. No significant lymphadenopathy. 2. Partially visualized small right pleural effusion and dependent right upper lobe opacity which may represent associated pneumonia or atelectasis.  Electronically Signed   By: Kristine Garbe M.D.   On: 01/21/2016 02:28   Ct Abdomen Pelvis W Contrast  Result Date: 01/21/2016 CLINICAL DATA:  Acute onset of fever. Failure to thrive and somnolence. Question of sepsis. Initial encounter. EXAM: CT ABDOMEN AND PELVIS WITH CONTRAST TECHNIQUE: Multidetector CT imaging of the abdomen and pelvis was performed using the standard protocol following bolus administration of intravenous contrast. CONTRAST:  14mL ISOVUE-300 IOPAMIDOL (ISOVUE-300) INJECTION 61% COMPARISON:  CT of the abdomen and pelvis from 12/18/2015 FINDINGS: Lower chest: Small bilateral pleural effusions are noted. Bibasilar airspace opacities may reflect atelectasis or possibly infection. The visualized portions of the mediastinum are unremarkable in appearance. Mild calcification noted at the aortic valve. A large right-sided hiatal hernia is noted, containing most of the stomach. Hepatobiliary: Mild soft tissue inflammation about the gallbladder is nonspecific, given the soft tissue inflammation about the abdomen related to the patient's large postoperative defect. The liver is unremarkable in appearance. The common bile duct is dilated to 1.4 cm, of uncertain significance. Pancreas: The pancreas is within normal limits. Spleen: The spleen  is unremarkable in appearance. Adrenals/Urinary Tract: The adrenal glands are unremarkable in appearance. The kidneys are within normal limits. There is no evidence of hydronephrosis. No renal or ureteral stones are identified. No perinephric stranding is seen. Stomach/Bowel: Postoperative change is noted at the right lower quadrant. Mild soft tissue inflammation is suggested about the transverse colon. The colon is largely filled with stool. Scattered diverticulosis is noted along the distal sigmoid colon. Apparent wall thickening at the rectum could reflect mild proctitis. Mild presacral stranding is noted. Visualized small bowel loops are grossly  unremarkable. The stomach is otherwise unremarkable. Vascular/Lymphatic: Scattered calcification is seen along the abdominal aorta and its branches. The abdominal aorta is otherwise grossly unremarkable. The inferior vena cava is grossly unremarkable. No retroperitoneal lymphadenopathy is seen. No pelvic sidewall lymphadenopathy is identified. Reproductive: The bladder is mildly distended. Mild soft tissue inflammation is suggested about the bladder. The patient is status post hysterectomy. No suspicious adnexal masses are seen. Other: A very large anterior abdominal wall defect is noted, with packing material and underlying soft tissue thickening. Musculoskeletal: No acute osseous abnormalities are identified. Right convex thoracolumbar scoliosis is noted. The visualized musculature is unremarkable in appearance. IMPRESSION: 1. Apparent wall thickening at the rectum could reflect mild proctitis. 2. Small bilateral pleural effusions noted. Bibasilar airspace opacities may reflect atelectasis or possibly infection. 3. Large right-sided hiatal hernia, containing most of the stomach. 4. Mild soft tissue inflammation suggested about the bladder. Would correlate for any evidence of cystitis. 5. Nonspecific soft tissue inflammation suggested about the transverse colon. 6. Mild inflammation about the gallbladder is nonspecific, given the underlying diffuse soft tissue inflammation about the abdomen. 7. Very large anterior abdominal wall defect again noted, with packing material and underlying soft tissue thickening. 8. Scattered diverticulosis along the distal sigmoid colon, without evidence of diverticulitis. 9. Scattered aortic atherosclerosis noted. 10. Right convex thoracolumbar scoliosis noted. Electronically Signed   By: Garald Balding M.D.   On: 01/21/2016 02:35   Dg Chest Port 1 View  Result Date: 01/20/2016 CLINICAL DATA:  fever EXAM: PORTABLE CHEST 1 VIEW COMPARISON:  12/25/2015 FINDINGS: Right-sided PICC  line has been removed. The heart is enlarged. Stable elevation of right hemidiaphragm. There is interstitial edema. There has been improvement aeration of the right lung base. No new consolidation. IMPRESSION: 1. Cardiomegaly and mild interstitial edema. 2. Improved aeration. Electronically Signed   By: Nolon Nations M.D.   On: 01/20/2016 18:28   Dg Chest Port 1 View  Result Date: 12/25/2015 CLINICAL DATA:  Productive cough and shortness of breath. Surgery on 12/22/2015 for infected ventral hernia mesh and small bowel obstruction. EXAM: PORTABLE CHEST 1 VIEW COMPARISON:  CT of the abdomen 12/18/2015 and esophagram on 12/20/2015. FINDINGS: Chronic opacity in the right lower chest likely relates to the known large hiatal/paraesophageal hernia. There may be a developing subtle infiltrate in the left lower lung. Lung volumes are low. The heart size is stable. PICC line tip is in the SVC. IMPRESSION: Possible developing left lower lung infiltrate. Opacity in the right chest likely relates to the known large hiatal/paraesophageal hernia. Electronically Signed   By: Aletta Edouard M.D.   On: 12/25/2015 11:41   Dg Chest Port 1 View  Result Date: 12/22/2015 CLINICAL DATA:  Decreased oxygen saturation. EXAM: PORTABLE CHEST 1 VIEW COMPARISON:  Chest radiographs Aug 05, 2014 and December 18, 2015 FINDINGS: There is a large paraesophageal hernia on the right, grossly stable. There is mild atelectasis in each mid lung. Lungs elsewhere  clear. Heart size is upper normal with pulmonary vascularity within normal limits. There is atherosclerotic calcification in the aorta. Central catheter tip is in the superior vena cava. There are total shoulder replacements bilaterally. No pneumothorax. IMPRESSION: Large right-sided paraesophageal hernia. Areas of mild atelectasis without frank edema or consolidation. Stable cardiac silhouette. Aortic atherosclerosis. Central catheter tip in superior vena cava. Electronically Signed    By: Lowella Grip III M.D.   On: 12/22/2015 15:50     LOS: 1 day   Oren Binet, MD  [facility] Pager:[phone]  If 7PM-7AM, please contact night-coverage [email or web address] Password [email or web address] 01/21/2016, 1:49 PM

## 2016-01-22 DIAGNOSIS — G894 Chronic pain syndrome: Secondary | ICD-10-CM | POA: Diagnosis not present

## 2016-01-22 DIAGNOSIS — A419 Sepsis, unspecified organism: Secondary | ICD-10-CM | POA: Diagnosis not present

## 2016-01-22 DIAGNOSIS — E871 Hypo-osmolality and hyponatremia: Secondary | ICD-10-CM | POA: Diagnosis not present

## 2016-01-22 DIAGNOSIS — K1120 Sialoadenitis, unspecified: Secondary | ICD-10-CM | POA: Diagnosis not present

## 2016-01-22 LAB — URINE CULTURE

## 2016-01-22 LAB — CBC
HCT: 26.9 % — ABNORMAL LOW (ref 36.0–46.0)
Hemoglobin: 8.5 g/dL — ABNORMAL LOW (ref 12.0–15.0)
MCH: 29.8 pg (ref 26.0–34.0)
MCHC: 31.6 g/dL (ref 30.0–36.0)
MCV: 94.4 fL (ref 78.0–100.0)
PLATELETS: 397 10*3/uL (ref 150–400)
RBC: 2.85 MIL/uL — AB (ref 3.87–5.11)
RDW: 14.5 % (ref 11.5–15.5)
WBC: 13.6 10*3/uL — AB (ref 4.0–10.5)

## 2016-01-22 LAB — BASIC METABOLIC PANEL
ANION GAP: 4 — AB (ref 5–15)
BUN: 12 mg/dL (ref 6–20)
CO2: 29 mmol/L (ref 22–32)
Calcium: 9.5 mg/dL (ref 8.9–10.3)
Chloride: 100 mmol/L — ABNORMAL LOW (ref 101–111)
Creatinine, Ser: 0.64 mg/dL (ref 0.44–1.00)
GFR calc Af Amer: >60 mL/min (ref >60)
GLUCOSE: 105 mg/dL — AB (ref 65–99)
POTASSIUM: 3.9 mmol/L (ref 3.5–5.1)
SODIUM: 133 mmol/L — AB (ref 135–145)

## 2016-01-22 LAB — PROCALCITONIN: Procalcitonin: 0.15 ng/mL

## 2016-01-22 MED ORDER — MENTHOL 3 MG MT LOZG: 1.0000 | LOZENGE | OROMUCOSAL | Status: DC | PRN

## 2016-01-22 MED ORDER — SODIUM CHLORIDE 0.9 % IV BOLUS (SEPSIS)
500.0000 mL | Freq: Once | INTRAVENOUS | Status: AC
  Administered 2016-01-22: 500 mL via INTRAVENOUS

## 2016-01-22 NOTE — Progress Notes (Signed)
BP 84/60. Patient diaphoretic but otherwise asymptomatic. Dr. Sloan Leiter notified via page. Will continue to monitor.  Monique Russo

## 2016-01-22 NOTE — Progress Notes (Signed)
PROGRESS NOTE        PATIENT DETAILS Name: Monique Russo Age: 82 y.o. Sex: female Date of Birth: 08/09/1930 Admit Date: 01/20/2016 Admitting Physician Edwin Dada, MD [id number] DAVIDSON, MD  Brief Narrative: Patient is a 82 y.o. female with a past medical history significant for chronic pain on methadone, chronic non-healing ventral wound with recent SBO requiring ex-lap who presents from SNF for evaluation of sepsis along with acute encephalopathy and Right-sided parotitis.  Subjective: Mostly lethargic, but is otherwise awake and alert. Answers all my questions appropriately, recognizes her daughter at bedside.   Assessment/Plan: Sepsis: Improved, sepsis pathophysiology has essentially resolved. Likely etiology is right-sided parotitis. CT of the abdomen negative for any acute abnormalities/infectious sources. Continue empiric antibiotics, blood cultures negative so far  Right-sided parotitis: Probably causing above. CT scan of neck does not show any abscess. Continue empiric antibiotics, or if any signs of abscess develops we will consult ENT.  s/p exploratory recent laparotomy, removal of mesh, placement of Phasix mesh, 12/21/15: General surgery following-and defer to general surgery. As noted above CT scan of the abdomen negative for any acute abnormality/sources of infection.  Hyponatremia: Very mild, doubt any need for workup at this time. Follow periodically, if worsen significantly then we can consider further workup  Anemia: Appears to have anemia secondary to chronic disease-likely worsened by acute illness. No evidence of bleeding at this time-follow hemoglobin periodically.  Chronic diastolic heart failure: Clinically compensated, follow weights-resume Lasix over the next few days.  Chronic pain syndrome: Continue methadone, Cymbalta. Given ongoing lethargy-stopped Neurontin and oxycodone., Continue MiraLAX/Senokot-S bowel  regimen.   GERD: Continue PPI  History of dysphagia: Recently was seen by speech therapy during her most recent admission-recommendations were for a dysphagia 3 diet-l restart dysphagia 3 diet.  Malnutrition of severe degree: Continue supplements  Ethics/goals of care: Very frail-elderly female-nursing home resident-with multiple medical problems-with failure to thrive syndrome-multiple readmissions in the recent past-coming in with right-sided parotitis and sepsis. If no improvement in the next few days, may need a palliative care consult. Poor overall prognoses-daughter at bedside and aware.  DVT Prophylaxis: Prophylactic Lovenox   Code Status:  DNR  Family Communication: Daughter at bedside  Disposition Plan: Remain inpatient-back to SNF on discharge in next 2-3 days  Antimicrobial agents: See below  Procedures: None  CONSULTS: CCS  Time spent: 30 minutes-Greater than 50% of this time was spent in counseling, explanation of diagnosis, planning of further management, and coordination of care.  MEDICATIONS: Anti-infectives    Start     Dose/Rate Route Frequency Ordered Stop   01/21/16 0500  vancomycin (VANCOCIN) 1,250 mg in sodium chloride 0.9 % 250 mL IVPB     1,250 mg 166.7 mL/hr over 90 Minutes Intravenous Every 24 hours 01/20/16 1843     01/21/16 0300  piperacillin-tazobactam (ZOSYN) IVPB 3.375 g     3.375 g 12.5 mL/hr over 240 Minutes Intravenous Every 8 hours 01/20/16 1842     01/20/16 1745  piperacillin-tazobactam (ZOSYN) IVPB 3.375 g     3.375 g 100 mL/hr over 30 Minutes Intravenous  Once 01/20/16 1735 01/20/16 1833   01/20/16 1745  vancomycin (VANCOCIN) IVPB 1000 mg/200 mL premix     1,000 mg 200 mL/hr over 60 Minutes Intravenous  Once 01/20/16 1735 01/20/16 1903      Scheduled Meds: . DULoxetine  60  mg Oral QHS  . enoxaparin (LOVENOX) injection  40 mg Subcutaneous Q24H  . esomeprazole  20 mg Oral Daily  . feeding supplement (PRO-STAT SUGAR FREE 64)   30 mL Oral TID  . gabapentin  400 mg Oral BID  . lactose free nutrition  237 mL Oral BID BM  . magnesium hydroxide  30 mL Oral QODAY  . magnesium oxide  400 mg Oral Daily  . methadone  5 mg Oral Q12H  . piperacillin-tazobactam (ZOSYN)  IV  3.375 g Intravenous Q8H  . polyethylene glycol  17 g Oral Daily  . senna  2 tablet Oral QHS  . timolol  1 drop Both Eyes BID  . vancomycin  1,250 mg Intravenous Q24H   Continuous Infusions:  PRN Meds:.acetaminophen **OR** acetaminophen, bisacodyl, diclofenac sodium, ondansetron **OR** ondansetron (ZOFRAN) IV, oxyCODONE   PHYSICAL EXAM: Vital signs: Vitals:   01/22/16 0529 01/22/16 0600 01/22/16 1015 01/22/16 1134  BP: (!) 85/39 (!) 93/47 (!) 86/40 (!) 89/49  Pulse: 69     Resp: 20     Temp: 100 F (37.8 C)  99.7 F (37.6 C)   TempSrc: Oral     SpO2: 94%     Weight:      Height:       Filed Weights   01/20/16 1709 01/22/16 0113  Weight: 89.4 kg (197 lb) 88.9 kg (196 lb)   Body mass index is 38.28 kg/m.   General appearance :Awake, alert, not in any distress. Speech Clear. Not toxic Looking Eyes:, pupils equally reactive to light and accomodation,no scleral icterus.Pink conjunctiva HEENT: Atraumatic and Normocephalic Neck: supple, no JVD. Small nodular area just below the right mandibular angle-moderately tender.. No thyromegaly Resp:Good air entry bilaterally, no added sounds  CVS: S1 S2 regular, no murmurs.  GI: Bowel sounds present, Non tender and not distended with no gaurding, rigidity or rebound.No organomegaly. Dressing in place (did not open) Extremities: B/L Lower Ext shows trace edema, both legs are warm to touch Neurology:  speech clear,Non focal, sensation is grossly intact. Psychiatric:  Alert and oriented x 3. Normal mood. Musculoskeletal:No digital cyanosis Skin:No Rash, warm and dry Wounds:N/A  I have personally reviewed following labs and imaging studies  LABORATORY DATA: CBC:  Recent Labs Lab 01/20/16 1718  01/21/16 0322 01/22/16 0631  WBC 13.8* 12.4* 13.6*  NEUTROABS 9.9*  --   --   HGB 10.2* 8.7* 8.5*  HCT 32.6* 27.6* 26.9*  MCV 95.0 94.5 94.4  PLT 447* 414* [id number]    Basic Metabolic Panel:  Recent Labs Lab 01/20/16 1718 01/21/16 0322 01/22/16 0631  NA 131* 133* 133*  K 4.7 4.0 3.9  CL 92* 98* 100*  CO2 32 28 29  GLUCOSE 109* 109* 105*  BUN 14 11 12   CREATININE 0.62 0.55 0.64  CALCIUM 10.1 9.3 9.5    GFR: Estimated Creatinine Clearance: 52.9 mL/min (by C-G formula based on SCr of 0.64 mg/dL).  Liver Function Tests:  Recent Labs Lab 01/20/16 1718  AST 25  ALT 14  ALKPHOS 130*  BILITOT 0.3  PROT 7.2  ALBUMIN 1.9*   No results for input(s): LIPASE, AMYLASE in the last 168 hours. No results for input(s): AMMONIA in the last 168 hours.  Coagulation Profile: No results for input(s): INR, PROTIME in the last 168 hours.  Cardiac Enzymes: No results for input(s): CKTOTAL, CKMB, CKMBINDEX, TROPONINI in the last 168 hours.  BNP (last 3 results) No results for input(s): PROBNP in the last 8760 hours.  HbA1C:  No results for input(s): HGBA1C in the last 72 hours.  CBG:  Recent Labs Lab 01/20/16 1722  GLUCAP 99    Lipid Profile: No results for input(s): CHOL, HDL, LDLCALC, TRIG, CHOLHDL, LDLDIRECT in the last 72 hours.  Thyroid Function Tests: No results for input(s): TSH, T4TOTAL, FREET4, T3FREE, THYROIDAB in the last 72 hours.  Anemia Panel: No results for input(s): VITAMINB12, FOLATE, FERRITIN, TIBC, IRON, RETICCTPCT in the last 72 hours.  Urine analysis:    Component Value Date/Time   COLORURINE YELLOW 01/20/2016 2209   APPEARANCEUR CLOUDY (A) 01/20/2016 2209   LABSPEC 1.015 01/20/2016 2209   PHURINE 7.0 01/20/2016 2209   GLUCOSEU NEGATIVE 01/20/2016 2209   HGBUR NEGATIVE 01/20/2016 2209   BILIRUBINUR NEGATIVE 01/20/2016 2209   KETONESUR NEGATIVE 01/20/2016 2209   PROTEINUR NEGATIVE 01/20/2016 2209   UROBILINOGEN 0.2 08/04/2014 2100   NITRITE  NEGATIVE 01/20/2016 2209   LEUKOCYTESUR TRACE (A) 01/20/2016 2209    Sepsis Labs: Lactic Acid, Venous    Component Value Date/Time   LATICACIDVEN 1.06 01/20/2016 1736    MICROBIOLOGY: Recent Results (from the past 240 hour(s))  Culture, blood (Routine x 2)     Status: None (Preliminary result)   Collection Time: 01/20/16  5:18 PM  Result Value Ref Range Status   Specimen Description BLOOD LEFT FOREARM  Final   Special Requests BOTTLES DRAWN AEROBIC AND ANAEROBIC 5CC  Final   Culture NO GROWTH 2 DAYS  Final   Report Status PENDING  Incomplete  Culture, blood (Routine x 2)     Status: None (Preliminary result)   Collection Time: 01/20/16  5:57 PM  Result Value Ref Range Status   Specimen Description BLOOD RIGHT ANTECUBITAL  Final   Special Requests BOTTLES DRAWN AEROBIC AND ANAEROBIC 5CC  Final   Culture NO GROWTH 2 DAYS  Final   Report Status PENDING  Incomplete  Urine culture     Status: Abnormal   Collection Time: 01/20/16 10:09 PM  Result Value Ref Range Status   Specimen Description URINE, RANDOM  Final   Special Requests NONE  Final   Culture 10,000 COLONIES/mL YEAST (A)  Final   Report Status 01/22/2016 FINAL  Final    RADIOLOGY STUDIES/RESULTS: Ct Soft Tissue Neck W Contrast  Result Date: 01/21/2016 CLINICAL DATA:  82 y/o  F; enlarged right parotid gland with sepsis. EXAM: CT NECK WITH CONTRAST TECHNIQUE: Multidetector CT imaging of the neck was performed using the standard protocol following the bolus administration of intravenous contrast. CONTRAST:  144mL ISOVUE-300 IOPAMIDOL (ISOVUE-300) INJECTION 61% COMPARISON:  100 cc Isovue-300 FINDINGS: Pharynx and larynx: Normal. No mass or swelling. Salivary glands: There is asymmetric enhancement of the right parotid gland both superficial and deep lobes and ill-defined pattern and mild surrounding inflammatory changes of the subcutaneous fat compatible with parotiditis. Thyroid: Normal. Lymph nodes: None enlarged or abnormal  density. Vascular: Mild calcific atherosclerosis of carotid bifurcations without significant stenosis. Brief retropharyngeal submucosal course of right internal carotid artery. Carotid and vertebral arteries of the neck as well as internal jugular veins are patent. Atherosclerosis of aortic arch with moderate calcification. Limited intracranial: Negative. Visualized orbits: Negative. Mastoids and visualized paranasal sinuses: Clear. Under pneumatized mastoid air cells probably represents sequelae of chronic otomastoiditis. Skeleton: No acute osseous abnormality is evident. Multilevel cervical degenerative changes with both disc osteophyte complexes and facet arthrosis. Grade 1 C3-4 retrolisthesis and C4-5 anterolisthesis on a degenerative basis. Multilevel mild to moderate bony foraminal narrowing. At least moderate canal stenosis at the  C3-4 level. Upper chest: Small right-sided pleural effusion and dependent opacity which may represent atelectasis or pneumonia, partially visualized. Other: None. IMPRESSION: 1. Findings consistent with right parotiditis. No abscess identified. No significant lymphadenopathy. 2. Partially visualized small right pleural effusion and dependent right upper lobe opacity which may represent associated pneumonia or atelectasis. Electronically Signed   By: Kristine Garbe M.D.   On: 01/21/2016 02:28   Ct Abdomen Pelvis W Contrast  Result Date: 01/21/2016 CLINICAL DATA:  Acute onset of fever. Failure to thrive and somnolence. Question of sepsis. Initial encounter. EXAM: CT ABDOMEN AND PELVIS WITH CONTRAST TECHNIQUE: Multidetector CT imaging of the abdomen and pelvis was performed using the standard protocol following bolus administration of intravenous contrast. CONTRAST:  178mL ISOVUE-300 IOPAMIDOL (ISOVUE-300) INJECTION 61% COMPARISON:  CT of the abdomen and pelvis from 12/18/2015 FINDINGS: Lower chest: Small bilateral pleural effusions are noted. Bibasilar airspace opacities  may reflect atelectasis or possibly infection. The visualized portions of the mediastinum are unremarkable in appearance. Mild calcification noted at the aortic valve. A large right-sided hiatal hernia is noted, containing most of the stomach. Hepatobiliary: Mild soft tissue inflammation about the gallbladder is nonspecific, given the soft tissue inflammation about the abdomen related to the patient's large postoperative defect. The liver is unremarkable in appearance. The common bile duct is dilated to 1.4 cm, of uncertain significance. Pancreas: The pancreas is within normal limits. Spleen: The spleen is unremarkable in appearance. Adrenals/Urinary Tract: The adrenal glands are unremarkable in appearance. The kidneys are within normal limits. There is no evidence of hydronephrosis. No renal or ureteral stones are identified. No perinephric stranding is seen. Stomach/Bowel: Postoperative change is noted at the right lower quadrant. Mild soft tissue inflammation is suggested about the transverse colon. The colon is largely filled with stool. Scattered diverticulosis is noted along the distal sigmoid colon. Apparent wall thickening at the rectum could reflect mild proctitis. Mild presacral stranding is noted. Visualized small bowel loops are grossly unremarkable. The stomach is otherwise unremarkable. Vascular/Lymphatic: Scattered calcification is seen along the abdominal aorta and its branches. The abdominal aorta is otherwise grossly unremarkable. The inferior vena cava is grossly unremarkable. No retroperitoneal lymphadenopathy is seen. No pelvic sidewall lymphadenopathy is identified. Reproductive: The bladder is mildly distended. Mild soft tissue inflammation is suggested about the bladder. The patient is status post hysterectomy. No suspicious adnexal masses are seen. Other: A very large anterior abdominal wall defect is noted, with packing material and underlying soft tissue thickening. Musculoskeletal: No  acute osseous abnormalities are identified. Right convex thoracolumbar scoliosis is noted. The visualized musculature is unremarkable in appearance. IMPRESSION: 1. Apparent wall thickening at the rectum could reflect mild proctitis. 2. Small bilateral pleural effusions noted. Bibasilar airspace opacities may reflect atelectasis or possibly infection. 3. Large right-sided hiatal hernia, containing most of the stomach. 4. Mild soft tissue inflammation suggested about the bladder. Would correlate for any evidence of cystitis. 5. Nonspecific soft tissue inflammation suggested about the transverse colon. 6. Mild inflammation about the gallbladder is nonspecific, given the underlying diffuse soft tissue inflammation about the abdomen. 7. Very large anterior abdominal wall defect again noted, with packing material and underlying soft tissue thickening. 8. Scattered diverticulosis along the distal sigmoid colon, without evidence of diverticulitis. 9. Scattered aortic atherosclerosis noted. 10. Right convex thoracolumbar scoliosis noted. Electronically Signed   By: Garald Balding M.D.   On: 01/21/2016 02:35   Dg Chest Port 1 View  Result Date: 01/20/2016 CLINICAL DATA:  fever EXAM: PORTABLE CHEST 1  VIEW COMPARISON:  12/25/2015 FINDINGS: Right-sided PICC line has been removed. The heart is enlarged. Stable elevation of right hemidiaphragm. There is interstitial edema. There has been improvement aeration of the right lung base. No new consolidation. IMPRESSION: 1. Cardiomegaly and mild interstitial edema. 2. Improved aeration. Electronically Signed   By: Nolon Nations M.D.   On: 01/20/2016 18:28   Dg Chest Port 1 View  Result Date: 12/25/2015 CLINICAL DATA:  Productive cough and shortness of breath. Surgery on 12/22/2015 for infected ventral hernia mesh and small bowel obstruction. EXAM: PORTABLE CHEST 1 VIEW COMPARISON:  CT of the abdomen 12/18/2015 and esophagram on 12/20/2015. FINDINGS: Chronic opacity in the  right lower chest likely relates to the known large hiatal/paraesophageal hernia. There may be a developing subtle infiltrate in the left lower lung. Lung volumes are low. The heart size is stable. PICC line tip is in the SVC. IMPRESSION: Possible developing left lower lung infiltrate. Opacity in the right chest likely relates to the known large hiatal/paraesophageal hernia. Electronically Signed   By: Aletta Edouard M.D.   On: 12/25/2015 11:41     LOS: 2 days   Oren Binet, MD  [facility] Pager:[phone]  If 7PM-7AM, please contact night-coverage [email or web address] Password [email or web address] 01/22/2016, 2:07 PM

## 2016-01-22 NOTE — Progress Notes (Signed)
BP continues to stay low. Dr. Sloan Leiter notified via page. Verbal orders for 500 cc bolus to be given over 2 hours and hold narcotics. Patient denies pain and is asymptomatic. Will carry out orders and continue to monitor.  Monique Russo

## 2016-01-22 NOTE — Progress Notes (Signed)
Patient refused prostat and boost all day and stated she prefers chocolate ensure which she drank at lunch. She also just agreed to drink peach boost. Will continue to encourage additional nutrition for wound healing purposes and continue to monitor.  Wyonia Hough

## 2016-01-22 NOTE — Evaluation (Signed)
Physical Therapy Evaluation Patient Details Name: Monique Russo MRN: [id number] DOB: 08-21-1930 Today's Date: 01/22/2016   History of Present Illness  Patient is an 82 yo female admitted 01/20/16 with fever and AMS.  Patient with sepsis, and Rt-sided parotitis.    PMH:  Recent admit with SBO and exploratory lap, with chronic non-healing ventral wound (VAC); chronic pain; CHF; arthritis; depression; back surgery; Lt TSA  Clinical Impression  Patient presents with problems listed below.  Will benefit from acute PT to maximize functional mobility prior to discharge.  Session limited today due to hypotension.  Recommend patient return to SNF at d/c for continued therapy for transfer training/mobility.    Follow Up Recommendations SNF;Supervision/Assistance - 24 hour    Equipment Recommendations  None recommended by PT    Recommendations for Other Services       Precautions / Restrictions Precautions Precautions: Fall Precaution Comments: Abdominal wound with VAC Restrictions Weight Bearing Restrictions: No      Mobility  Bed Mobility Overal bed mobility: Needs Assistance;+2 for physical assistance Bed Mobility: Rolling Rolling: Mod assist;Max assist         General bed mobility comments: Patient able to initiate rolling by reaching for rail with UE.  Able to roll upper trunk to right side with rail.  Mod - max assist to complete lower body rolling.  RN in room. Patient with BP 86/40 and clammy.  Did not attempt to move to sitting position with RN agreement.  Transfers                 General transfer comment: NT  Ambulation/Gait                Stairs            Wheelchair Mobility    Modified Rankin (Stroke Patients Only)       Balance                                             Pertinent Vitals/Pain Pain Assessment: Faces Faces Pain Scale: Hurts whole lot Pain Location: Back and abdomen with movement Pain Descriptors /  Indicators: Sore;Grimacing Pain Intervention(s): Limited activity within patient's tolerance;Monitored during session;Repositioned;Patient requesting pain meds-RN notified    Home Living Family/patient expects to be discharged to:: Skilled nursing facility (Patient from SNF) Living Arrangements: Spouse/significant other Available Help at Discharge: Family;Personal care attendant;Available 24 hours/day (Aide 2x/week for bathing/ADL's)           Home Equipment: Wheelchair - power;Tub bench;Walker - 2 wheels;Cane - single point;Grab bars - tub/shower      Prior Function Level of Independence: Needs assistance   Gait / Transfers Assistance Needed: Assist for transfers into w/c.  Not ambulatory.  ADL's / Homemaking Assistance Needed: Assist with ADL's        Hand Dominance   Dominant Hand: Right    Extremity/Trunk Assessment   Upper Extremity Assessment: Generalized weakness           Lower Extremity Assessment: RLE deficits/detail;LLE deficits/detail RLE Deficits / Details: Strength grossly 2/5 LLE Deficits / Details: Strength grossly 2/5     Communication   Communication: No difficulties  Cognition Arousal/Alertness: Awake/alert;Lethargic (Initially lethargic.  More awake with mobility/RN giving med) Behavior During Therapy: Flat affect Overall Cognitive Status: Within Functional Limits for tasks assessed (For tasks performed)  General Comments      Exercises     Assessment/Plan    PT Assessment Patient needs continued PT services  PT Problem List Decreased strength;Decreased activity tolerance;Decreased balance;Decreased mobility;Decreased coordination;Decreased knowledge of use of DME;Obesity;Pain          PT Treatment Interventions Functional mobility training;Therapeutic activities;Therapeutic exercise;Balance training;Patient/family education    PT Goals (Current goals can be found in the Care Plan section)  Acute Rehab  PT Goals Patient Stated Goal: None stated PT Goal Formulation: With patient/family Time For Goal Achievement: 02/05/16 Potential to Achieve Goals: Fair    Frequency Min 2X/week   Barriers to discharge        Co-evaluation               End of Session   Activity Tolerance: Patient limited by fatigue;Patient limited by pain;Treatment limited secondary to medical complications (Comment) (Hypotension) Patient left: in bed;with call bell/phone within reach;with bed alarm set;with family/visitor present Nurse Communication: Mobility status;Need for lift equipment;Patient requests pain meds         Time: [id number] PT Time Calculation (min) (ACUTE ONLY): 22 min   Charges:   PT Evaluation $PT Eval Moderate Complexity: 1 Procedure     PT G Codes:        Despina Pole 07-Feb-2016, 10:47 AM Carita Pian. Sanjuana Kava, [place] Pager [phone]

## 2016-01-22 NOTE — Progress Notes (Signed)
Subjective: Pt with Monique Russo placed  Objective: Vital signs in last 24 hours: Temp:  [98.2 F (36.8 C)-100 F (37.8 C)] 99.7 F (37.6 C) (11/04 1015) Pulse Rate:  [69-79] 69 (11/04 0529) Resp:  [16-20] 20 (11/04 0529) BP: (59-97)/(39-49) 89/49 (11/04 1134) SpO2:  [94 %-100 %] 94 % (11/04 0529) Weight:  [88.9 kg (196 lb)] 88.9 kg (196 lb) (11/04 0113) Last BM Date: 01/18/16  Intake/Output from previous day: 11/03 0701 - 11/04 0700 In: 300 [IV Piggyback:300] Out: 200 [Urine:200] Intake/Output this shift: Total I/O In: 120 [P.O.:120] Out: -   General appearance: alert and cooperative GI: soft, Russo in place  Lab Results:   Recent Labs  01/21/16 0322 01/22/16 0631  WBC 12.4* 13.6*  HGB 8.7* 8.5*  HCT 27.6* 26.9*  PLT 414* 397   BMET  Recent Labs  01/21/16 0322 01/22/16 0631  NA 133* 133*  K 4.0 3.9  CL 98* 100*  CO2 28 29  GLUCOSE 109* 105*  BUN 11 12  CREATININE 0.55 0.64  CALCIUM 9.3 9.5   PT/INR No results for input(s): LABPROT, INR in the last 72 hours. ABG No results for input(s): PHART, HCO3 in the last 72 hours.  Invalid input(s): PCO2, PO2  Studies/Results: Ct Soft Tissue Neck W Contrast  Result Date: 01/21/2016 CLINICAL DATA:  82 y/o  F; enlarged right parotid gland with sepsis. EXAM: CT NECK WITH CONTRAST TECHNIQUE: Multidetector CT imaging of the neck was performed using the standard protocol following the bolus administration of intravenous contrast. CONTRAST:  124mL ISOVUE-300 IOPAMIDOL (ISOVUE-300) INJECTION 61% COMPARISON:  100 cc Isovue-300 FINDINGS: Pharynx and larynx: Normal. No mass or swelling. Salivary glands: There is asymmetric enhancement of the right parotid gland both superficial and deep lobes and ill-defined pattern and mild surrounding inflammatory changes of the subcutaneous fat compatible with parotiditis. Thyroid: Normal. Lymph nodes: None enlarged or abnormal density. Vascular: Mild calcific atherosclerosis of carotid  bifurcations without significant stenosis. Brief retropharyngeal submucosal course of right internal carotid artery. Carotid and vertebral arteries of the neck as well as internal jugular veins are patent. Atherosclerosis of aortic arch with moderate calcification. Limited intracranial: Negative. Visualized orbits: Negative. Mastoids and visualized paranasal sinuses: Clear. Under pneumatized mastoid air cells probably represents sequelae of chronic otomastoiditis. Skeleton: No acute osseous abnormality is evident. Multilevel cervical degenerative changes with both disc osteophyte complexes and facet arthrosis. Grade 1 C3-4 retrolisthesis and C4-5 anterolisthesis on a degenerative basis. Multilevel mild to moderate bony foraminal narrowing. At least moderate canal stenosis at the C3-4 level. Upper chest: Small right-sided pleural effusion and dependent opacity which may represent atelectasis or pneumonia, partially visualized. Other: None. IMPRESSION: 1. Findings consistent with right parotiditis. No abscess identified. No significant lymphadenopathy. 2. Partially visualized small right pleural effusion and dependent right upper lobe opacity which may represent associated pneumonia or atelectasis. Electronically Signed   By: Kristine Garbe M.D.   On: 01/21/2016 02:28   Ct Abdomen Pelvis W Contrast  Result Date: 01/21/2016 CLINICAL DATA:  Acute onset of fever. Failure to thrive and somnolence. Question of sepsis. Initial encounter. EXAM: CT ABDOMEN AND PELVIS WITH CONTRAST TECHNIQUE: Multidetector CT imaging of the abdomen and pelvis was performed using the standard protocol following bolus administration of intravenous contrast. CONTRAST:  164mL ISOVUE-300 IOPAMIDOL (ISOVUE-300) INJECTION 61% COMPARISON:  CT of the abdomen and pelvis from 12/18/2015 FINDINGS: Lower chest: Small bilateral pleural effusions are noted. Bibasilar airspace opacities may reflect atelectasis or possibly infection. The  visualized portions of the  mediastinum are unremarkable in appearance. Mild calcification noted at the aortic valve. A large right-sided hiatal hernia is noted, containing most of the stomach. Hepatobiliary: Mild soft tissue inflammation about the gallbladder is nonspecific, given the soft tissue inflammation about the abdomen related to the patient's large postoperative defect. The liver is unremarkable in appearance. The common bile duct is dilated to 1.4 cm, of uncertain significance. Pancreas: The pancreas is within normal limits. Spleen: The spleen is unremarkable in appearance. Adrenals/Urinary Tract: The adrenal glands are unremarkable in appearance. The kidneys are within normal limits. There is no evidence of hydronephrosis. No renal or ureteral stones are identified. No perinephric stranding is seen. Stomach/Bowel: Postoperative change is noted at the right lower quadrant. Mild soft tissue inflammation is suggested about the transverse colon. The colon is largely filled with stool. Scattered diverticulosis is noted along the distal sigmoid colon. Apparent wall thickening at the rectum could reflect mild proctitis. Mild presacral stranding is noted. Visualized small bowel loops are grossly unremarkable. The stomach is otherwise unremarkable. Vascular/Lymphatic: Scattered calcification is seen along the abdominal aorta and its branches. The abdominal aorta is otherwise grossly unremarkable. The inferior vena cava is grossly unremarkable. No retroperitoneal lymphadenopathy is seen. No pelvic sidewall lymphadenopathy is identified. Reproductive: The bladder is mildly distended. Mild soft tissue inflammation is suggested about the bladder. The patient is status post hysterectomy. No suspicious adnexal masses are seen. Other: A very large anterior abdominal wall defect is noted, with packing material and underlying soft tissue thickening. Musculoskeletal: No acute osseous abnormalities are identified. Right  convex thoracolumbar scoliosis is noted. The visualized musculature is unremarkable in appearance. IMPRESSION: 1. Apparent wall thickening at the rectum could reflect mild proctitis. 2. Small bilateral pleural effusions noted. Bibasilar airspace opacities may reflect atelectasis or possibly infection. 3. Large right-sided hiatal hernia, containing most of the stomach. 4. Mild soft tissue inflammation suggested about the bladder. Would correlate for any evidence of cystitis. 5. Nonspecific soft tissue inflammation suggested about the transverse colon. 6. Mild inflammation about the gallbladder is nonspecific, given the underlying diffuse soft tissue inflammation about the abdomen. 7. Very large anterior abdominal wall defect again noted, with packing material and underlying soft tissue thickening. 8. Scattered diverticulosis along the distal sigmoid colon, without evidence of diverticulitis. 9. Scattered aortic atherosclerosis noted. 10. Right convex thoracolumbar scoliosis noted. Electronically Signed   By: Garald Balding M.D.   On: 01/21/2016 02:35   Dg Chest Port 1 View  Result Date: 01/20/2016 CLINICAL DATA:  fever EXAM: PORTABLE CHEST 1 VIEW COMPARISON:  12/25/2015 FINDINGS: Right-sided PICC line has been removed. The heart is enlarged. Stable elevation of right hemidiaphragm. There is interstitial edema. There has been improvement aeration of the right lung base. No new consolidation. IMPRESSION: 1. Cardiomegaly and mild interstitial edema. 2. Improved aeration. Electronically Signed   By: Nolon Nations M.D.   On: 01/20/2016 18:28    Anti-infectives: Anti-infectives    Start     Dose/Rate Route Frequency Ordered Stop   01/21/16 0500  vancomycin (VANCOCIN) 1,250 mg in sodium chloride 0.9 % 250 mL IVPB     1,250 mg 166.7 mL/hr over 90 Minutes Intravenous Every 24 hours 01/20/16 1843     01/21/16 0300  piperacillin-tazobactam (ZOSYN) IVPB 3.375 g     3.375 g 12.5 mL/hr over 240 Minutes  Intravenous Every 8 hours 01/20/16 1842     01/20/16 1745  piperacillin-tazobactam (ZOSYN) IVPB 3.375 g     3.375 g 100 mL/hr over 30 Minutes  Intravenous  Once 01/20/16 1735 01/20/16 1833   01/20/16 1745  vancomycin (VANCOCIN) IVPB 1000 mg/200 mL premix     1,000 mg 200 mL/hr over 60 Minutes Intravenous  Once 01/20/16 1735 01/20/16 1903      Assessment/Plan: S/p exploratory laparotomy, removal of mesh, placement of Phasix mesh, 12/21/15 douglas blackman, MD             CT 01/20/16 not significant for inta-abdominal source of infection             Russo, change on Monday             Sepsis Hyponatremia Chronic pain Severe malnutrition Chronic diastolic CHF  FEN: Regular diet, Boost, Pro-stat  ID: Zosyn 11/2 >>, Vanc 11/2 >> VTE: lovenox, SCD's  Patient is DNR  LOS: 2 days    Rosario Jacks., [facility] 01/22/2016

## 2016-01-23 DIAGNOSIS — Z515 Encounter for palliative care: Secondary | ICD-10-CM

## 2016-01-23 DIAGNOSIS — Z66 Do not resuscitate: Secondary | ICD-10-CM | POA: Diagnosis not present

## 2016-01-23 DIAGNOSIS — Z7189 Other specified counseling: Secondary | ICD-10-CM | POA: Diagnosis not present

## 2016-01-23 DIAGNOSIS — G894 Chronic pain syndrome: Secondary | ICD-10-CM | POA: Diagnosis not present

## 2016-01-23 DIAGNOSIS — K1120 Sialoadenitis, unspecified: Secondary | ICD-10-CM | POA: Diagnosis not present

## 2016-01-23 DIAGNOSIS — E871 Hypo-osmolality and hyponatremia: Secondary | ICD-10-CM | POA: Diagnosis not present

## 2016-01-23 DIAGNOSIS — A419 Sepsis, unspecified organism: Secondary | ICD-10-CM | POA: Diagnosis not present

## 2016-01-23 LAB — BASIC METABOLIC PANEL
Anion gap: 6 (ref 5–15)
BUN: 8 mg/dL (ref 6–20)
CALCIUM: 9.4 mg/dL (ref 8.9–10.3)
CO2: 28 mmol/L (ref 22–32)
Chloride: 100 mmol/L — ABNORMAL LOW (ref 101–111)
Creatinine, Ser: 0.62 mg/dL (ref 0.44–1.00)
GFR calc Af Amer: >60 mL/min (ref >60)
GLUCOSE: 92 mg/dL (ref 65–99)
Potassium: 3.8 mmol/L (ref 3.5–5.1)
Sodium: 134 mmol/L — ABNORMAL LOW (ref 135–145)

## 2016-01-23 LAB — CBC
HCT: 27.7 % — ABNORMAL LOW (ref 36.0–46.0)
Hemoglobin: 8.7 g/dL — ABNORMAL LOW (ref 12.0–15.0)
MCH: 29.4 pg (ref 26.0–34.0)
MCHC: 31.4 g/dL (ref 30.0–36.0)
MCV: 93.6 fL (ref 78.0–100.0)
PLATELETS: 381 10*3/uL (ref 150–400)
RBC: 2.96 MIL/uL — ABNORMAL LOW (ref 3.87–5.11)
RDW: 14.6 % (ref 11.5–15.5)
WBC: 15.1 10*3/uL — AB (ref 4.0–10.5)

## 2016-01-23 NOTE — Progress Notes (Signed)
Patient ID: Monique Russo, female   DOB: 1930-09-16, 82 y.o.   MRN: [id number] [facility] Surgery Progress Note:   * No surgery found *  Subjective: Mental status is marginal.  History provided by her husband.   Objective: Vital signs in last 24 hours: Temp:  [98.2 F (36.8 C)-99.2 F (37.3 C)] 99.2 F (37.3 C) (11/05 0615) Pulse Rate:  [63-71] 71 (11/05 0615) Resp:  [17-20] 20 (11/05 0615) BP: (82-113)/(36-45) 113/45 (11/05 0615) SpO2:  [93 %-100 %] 93 % (11/05 0615) Weight:  [76.1 kg (167 lb 12.3 oz)] 76.1 kg (167 lb 12.3 oz) (11/05 0357)  Intake/Output from previous day: 11/04 0701 - 11/05 0700 In: 87 [P.O.:240; IV Piggyback:350] Out: 240 [Urine:240] Intake/Output this shift: Total I/O In: -  Out: 1 [Stool:1]  Physical Exam: Work of breathing is normal.  Wound vac in place.    Lab Results:  Results for orders placed or performed during the hospital encounter of 01/20/16 (from the past 48 hour(s))  Procalcitonin     Status: None   Collection Time: 01/22/16  6:31 AM  Result Value Ref Range   Procalcitonin 0.15 ng/mL    Comment:        Interpretation: PCT (Procalcitonin) <= 0.5 ng/mL: Systemic infection (sepsis) is not likely. Local bacterial infection is possible. (NOTE)         ICU PCT Algorithm               Non ICU PCT Algorithm    ----------------------------     ------------------------------         PCT < 0.25 ng/mL                 PCT < 0.1 ng/mL     Stopping of antibiotics            Stopping of antibiotics       strongly encouraged.               strongly encouraged.    ----------------------------     ------------------------------       PCT level decrease by               PCT < 0.25 ng/mL       >= 80% from peak PCT       OR PCT 0.25 - 0.5 ng/mL          Stopping of antibiotics                                             encouraged.     Stopping of antibiotics           encouraged.    ----------------------------      ------------------------------       PCT level decrease by              PCT >= 0.25 ng/mL       < 80% from peak PCT        AND PCT >= 0.5 ng/mL            Continuin g antibiotics                                              encouraged.  Continuing antibiotics            encouraged.    ----------------------------     ------------------------------     PCT level increase compared          PCT > 0.5 ng/mL         with peak PCT AND          PCT >= 0.5 ng/mL             Escalation of antibiotics                                          strongly encouraged.      Escalation of antibiotics        strongly encouraged.   CBC     Status: Abnormal   Collection Time: 01/22/16  6:31 AM  Result Value Ref Range   WBC 13.6 (H) 4.0 - 10.5 K/uL   RBC 2.85 (L) 3.87 - 5.11 MIL/uL   Hemoglobin 8.5 (L) 12.0 - 15.0 g/dL   HCT 26.9 (L) 36.0 - 46.0 %   MCV 94.4 78.0 - 100.0 fL   MCH 29.8 26.0 - 34.0 pg   MCHC 31.6 30.0 - 36.0 g/dL   RDW 14.5 11.5 - 15.5 %   Platelets 397 150 - 400 K/uL  Basic metabolic panel     Status: Abnormal   Collection Time: 01/22/16  6:31 AM  Result Value Ref Range   Sodium 133 (L) 135 - 145 mmol/L   Potassium 3.9 3.5 - 5.1 mmol/L   Chloride 100 (L) 101 - 111 mmol/L   CO2 29 22 - 32 mmol/L   Glucose, Bld 105 (H) 65 - 99 mg/dL   BUN 12 6 - 20 mg/dL   Creatinine, Ser 0.64 0.44 - 1.00 mg/dL   Calcium 9.5 8.9 - 10.3 mg/dL   GFR calc non Af Amer >60 >60 mL/min   GFR calc Af Amer >60 >60 mL/min    Comment: (NOTE) The eGFR has been calculated using the CKD EPI equation. This calculation has not been validated in all clinical situations. eGFR's persistently <60 mL/min signify possible Chronic Kidney Disease.    Anion gap 4 (L) 5 - 15  CBC     Status: Abnormal   Collection Time: 01/23/16  5:36 AM  Result Value Ref Range   WBC 15.1 (H) 4.0 - 10.5 K/uL   RBC 2.96 (L) 3.87 - 5.11 MIL/uL   Hemoglobin 8.7 (L) 12.0 - 15.0 g/dL   HCT 27.7 (L) 36.0 - 46.0 %   MCV 93.6 78.0 -  100.0 fL   MCH 29.4 26.0 - 34.0 pg   MCHC 31.4 30.0 - 36.0 g/dL   RDW 14.6 11.5 - 15.5 %   Platelets 381 150 - 400 K/uL  Basic metabolic panel     Status: Abnormal   Collection Time: 01/23/16  5:36 AM  Result Value Ref Range   Sodium 134 (L) 135 - 145 mmol/L   Potassium 3.8 3.5 - 5.1 mmol/L   Chloride 100 (L) 101 - 111 mmol/L   CO2 28 22 - 32 mmol/L   Glucose, Bld 92 65 - 99 mg/dL   BUN 8 6 - 20 mg/dL   Creatinine, Ser 0.62 0.44 - 1.00 mg/dL   Calcium 9.4 8.9 - 10.3 mg/dL   GFR calc non Af Amer >60 >60 mL/min   GFR calc  Af Amer >60 >60 mL/min    Comment: (NOTE) The eGFR has been calculated using the CKD EPI equation. This calculation has not been validated in all clinical situations. eGFR's persistently <60 mL/min signify possible Chronic Kidney Disease.    Anion gap 6 5 - 15    Radiology/Results: No results found.  Anti-infectives: Anti-infectives    Start     Dose/Rate Route Frequency Ordered Stop   01/21/16 0500  vancomycin (VANCOCIN) 1,250 mg in sodium chloride 0.9 % 250 mL IVPB     1,250 mg 166.7 mL/hr over 90 Minutes Intravenous Every 24 hours 01/20/16 1843     01/21/16 0300  piperacillin-tazobactam (ZOSYN) IVPB 3.375 g  Status:  Discontinued     3.375 g 12.5 mL/hr over 240 Minutes Intravenous Every 8 hours 01/20/16 1842 01/23/16 1155   01/20/16 1745  piperacillin-tazobactam (ZOSYN) IVPB 3.375 g     3.375 g 100 mL/hr over 30 Minutes Intravenous  Once 01/20/16 1735 01/20/16 1833   01/20/16 1745  vancomycin (VANCOCIN) IVPB 1000 mg/200 mL premix     1,000 mg 200 mL/hr over 60 Minutes Intravenous  Once 01/20/16 1735 01/20/16 1903      Assessment/Plan: Problem List: Patient Active Problem List   Diagnosis Date Noted  . Sepsis ([place]) 01/20/2016  . Acute blood loss anemia 01/20/2016  . Hypotension   . Difficulty in walking, not elsewhere classified   . Disorder of sleep wake schedule   . Protein-calorie malnutrition, severe ([place])   . End of life care   . Goals  of care, counseling/discussion   . Palliative care by specialist   . Chronic bilateral low back pain without sciatica   . Small bowel obstruction 12/18/2015  . Diverticulitis of large intestine without perforation or abscess without bleeding 12/18/2015  . SBO (small bowel obstruction) 12/18/2015  . Left ventricular dysfunction  reported in chart, not confirmed on review of echo 11/28/2014  . Pulmonary arterial hypertension 11/28/2014  . Perforated chronic gastric ulcer ([place]) 08/11/2014  . Encounter for intubation   . Chronic pain syndrome   . Hyponatremia 07/17/2014  . Fatigue 07/09/2013  . DNR (do not resuscitate) 07/07/2013  . Palliative care encounter 07/07/2013  . Narcotic overdose 07/03/2013    Deconditioned.  They want to go to [facility] if possible   * No surgery found *    LOS: 3 days   Matt B. Hassell Done, MD, [facility] [phone] beeper [phone]  01/23/2016 1:17 PM

## 2016-01-23 NOTE — Progress Notes (Signed)
Pharmacy Antibiotic Note Monique Russo is a 82 y.o. female admitted on 01/20/2016 with sepsis started on vancomycin and Zosyn per pharmacy. Initially concerned for intraabdominal infection but CT abdomen negative for any infectious abnormalities. Pt also has R-sided parotitis that is now thought to be primary cause of sepsis. No abscess shown on CT neck, WBC up to 15.1, afebrile, SCr stable. After discussion with MD, will stop Zosyn but continue vancomycin for now but may narrow soon. Of note, palliative care has been consulted.  Plan: -Discontinue Zosyn -Continue vancomycin 1.25g IV daily -Monitor renal function, cultures, S/Sx infection -Consider vancomycin trough as needed  Height: 5' (152.4 cm) Weight: 167 lb 12.3 oz (76.1 kg) IBW/kg (Calculated) : 45.5  Temp (24hrs), Avg:98.8 F (37.1 C), Min:98.2 F (36.8 C), Max:99.2 F (37.3 C)   Recent Labs Lab 01/20/16 1718 01/20/16 1736 01/21/16 0322 01/22/16 0631 01/23/16 0536  WBC 13.8*  --  12.4* 13.6* 15.1*  CREATININE 0.62  --  0.55 0.64 0.62  LATICACIDVEN  --  1.06  --   --   --     Estimated Creatinine Clearance: 48.5 mL/min (by C-G formula based on SCr of 0.62 mg/dL).    Allergies  Allergen Reactions  . Tape Other (See Comments)    SKIN IS VERY THIN; IT BRUISES AND TEARS EASILY!! -THX    Antimicrobials this admission:  11/2 Zosyn >> 11/5 11/2 vancomycin >>   Dose adjustments this admission:  N/a  Microbiology results:  11/2 BCx: ngtd 11/2 UCx: 10k yeast   Thank you for allowing pharmacy to be a part of this patient's care.  Arrie Senate, PharmD PGY-1 Pharmacy Resident Pager: [phone] 01/23/2016

## 2016-01-23 NOTE — Progress Notes (Signed)
PROGRESS NOTE        PATIENT DETAILS Name: Monique Russo Age: 82 y.o. Sex: female Date of Birth: [birth year] Admit Date: 01/20/2016 Admitting Physician Edwin Dada, MD [id number] DAVIDSON, MD  Brief Narrative: Patient is a 82 y.o. female with a past medical history significant for chronic pain on methadone, chronic non-healing ventral wound with recent SBO requiring ex-lap who presents from SNF for evaluation of sepsis along with acute encephalopathy and Right-sided parotitis.  Subjective: Sleeping and lethargic most of the time, but easily arousable, and answers all my questions appropriately when awake. No family at bedside this morning. Claims that right jaw/parotid area is better than the past few days.  Assessment/Plan: Sepsis: Improved, sepsis pathophysiology has essentially resolved. Likely etiology is right-sided parotitis. CT of the abdomen negative for any acute abnormalities/infectious sources. Continue empiric antibiotics, blood cultures negative so far  Right-sided parotitis: Probably causing above. CT scan of neck does not show any abscess. Continue vancomycin, discontinue Zosyn.If any signs of abscess develops we will consult ENT.  s/p exploratory recent laparotomy, removal of mesh, placement of Phasix mesh, 12/21/15: General surgery following-and defer to general surgery. As noted above CT scan of the abdomen negative for any acute abnormality/sources of infection.  Hyponatremia: Very mild, doubt any need for workup at this time. Follow periodically, if worsen significantly then we can consider further workup  Anemia: Appears to have anemia secondary to chronic disease-likely worsened by acute illness. No evidence of bleeding at this time-follow hemoglobin periodically.  Chronic diastolic heart failure: Clinically compensated, follow weights-resume Lasix over the next few days.  Chronic pain syndrome: Continue methadone (dose  reduced), Cymbalta. Given ongoing lethargy-stopped Neurontin and oxycodone., Continue MiraLAX/Senokot-S bowel regimen.   GERD: Continue PPI  History of dysphagia: Recently was seen by speech therapy during her most recent admission-recommendations were for a dysphagia 3 diet  Malnutrition of severe degree: Continue supplements  Failure to thrive syndrome: Major issue currently-mostly sleeping and not eating well per RN.  Ethics/goals of care: Very frail-elderly female-nursing home resident-with multiple medical problems-with failure to thrive syndrome-multiple readmissions in the recent past-coming in with right-sided parotitis and sepsis. Unfortunately, continues to exhibit failure to thrive syndrome with very poor oral intake. Have consulted palliative care team. No family at bedside this morning.  DVT Prophylaxis: Prophylactic Lovenox   Code Status: DNR  Family Communication: Daughter at bedside  Disposition Plan: Remain inpatient-back to SNF on discharge in next 2-3 days  Antimicrobial agents: See below  Procedures: None  CONSULTS: CCS  Time spent: 30 minutes-Greater than 50% of this time was spent in counseling, explanation of diagnosis, planning of further management, and coordination of care.  MEDICATIONS: Anti-infectives    Start     Dose/Rate Route Frequency Ordered Stop   01/21/16 0500  vancomycin (VANCOCIN) 1,250 mg in sodium chloride 0.9 % 250 mL IVPB     1,250 mg 166.7 mL/hr over 90 Minutes Intravenous Every 24 hours 01/20/16 1843     01/21/16 0300  piperacillin-tazobactam (ZOSYN) IVPB 3.375 g  Status:  Discontinued     3.375 g 12.5 mL/hr over 240 Minutes Intravenous Every 8 hours 01/20/16 1842 01/23/16 1155   01/20/16 1745  piperacillin-tazobactam (ZOSYN) IVPB 3.375 g     3.375 g 100 mL/hr over 30 Minutes Intravenous  Once 01/20/16 1735 01/20/16 1833   01/20/16 1745  vancomycin (  VANCOCIN) IVPB 1000 mg/200 mL premix     1,000 mg 200 mL/hr over 60 Minutes  Intravenous  Once 01/20/16 1735 01/20/16 1903      Scheduled Meds: . DULoxetine  60 mg Oral QHS  . enoxaparin (LOVENOX) injection  40 mg Subcutaneous Q24H  . esomeprazole  20 mg Oral Daily  . feeding supplement (PRO-STAT SUGAR FREE 64)  30 mL Oral TID  . lactose free nutrition  237 mL Oral BID BM  . magnesium hydroxide  30 mL Oral QODAY  . magnesium oxide  400 mg Oral Daily  . methadone  5 mg Oral Q12H  . polyethylene glycol  17 g Oral Daily  . senna  2 tablet Oral QHS  . timolol  1 drop Both Eyes BID  . vancomycin  1,250 mg Intravenous Q24H   Continuous Infusions: PRN Meds:.acetaminophen **OR** acetaminophen, bisacodyl, diclofenac sodium, menthol-cetylpyridinium, ondansetron **OR** ondansetron (ZOFRAN) IV   PHYSICAL EXAM: Vital signs: Vitals:   01/22/16 1416 01/22/16 2148 01/23/16 0357 01/23/16 0615  BP: (!) 109/45 (!) 82/36  (!) 113/45  Pulse: 70 63  71  Resp: 17 18  20   Temp: 99 F (37.2 C) 98.2 F (36.8 C)  99.2 F (37.3 C)  TempSrc: Oral   Oral  SpO2: 99% 100%  93%  Weight:   76.1 kg (167 lb 12.3 oz)   Height:       Filed Weights   01/20/16 1709 01/22/16 0113 01/23/16 0357  Weight: 89.4 kg (197 lb) 88.9 kg (196 lb) 76.1 kg (167 lb 12.3 oz)   Body mass index is 32.77 kg/m.   General appearance :Awake, alert, not in any distress. Speech Clear. Not toxic Looking Eyes:, pupils equally reactive to light and accomodation,no scleral icterus.Pink conjunctiva HEENT: Atraumatic and Normocephalic Neck: supple, no JVD. Small nodular area just below the right mandibular angle-moderately tender.. No thyromegaly Resp:Good air entry bilaterally, no added sounds  CVS: S1 S2 regular, no murmurs.  GI: Bowel sounds present, Non tender and not distended with no gaurding, rigidity or rebound.No organomegaly. Dressing in place (did not open) Extremities: B/L Lower Ext shows trace edema, both legs are warm to touch Neurology:  speech clear,Non focal, sensation is grossly  intact. Psychiatric:  Alert and oriented x 3. Normal mood. Musculoskeletal:No digital cyanosis Skin:No Rash, warm and dry Wounds:N/A  I have personally reviewed following labs and imaging studies  LABORATORY DATA: CBC:  Recent Labs Lab 01/20/16 1718 01/21/16 0322 01/22/16 0631 01/23/16 0536  WBC 13.8* 12.4* 13.6* 15.1*  NEUTROABS 9.9*  --   --   --   HGB 10.2* 8.7* 8.5* 8.7*  HCT 32.6* 27.6* 26.9* 27.7*  MCV 95.0 94.5 94.4 93.6  PLT 447* 414* 397 [id number]    Basic Metabolic Panel:  Recent Labs Lab 01/20/16 1718 01/21/16 0322 01/22/16 0631 01/23/16 0536  NA 131* 133* 133* 134*  K 4.7 4.0 3.9 3.8  CL 92* 98* 100* 100*  CO2 32 28 29 28   GLUCOSE 109* 109* 105* 92  BUN 14 11 12 8   CREATININE 0.62 0.55 0.64 0.62  CALCIUM 10.1 9.3 9.5 9.4    GFR: Estimated Creatinine Clearance: 48.5 mL/min (by C-G formula based on SCr of 0.62 mg/dL).  Liver Function Tests:  Recent Labs Lab 01/20/16 1718  AST 25  ALT 14  ALKPHOS 130*  BILITOT 0.3  PROT 7.2  ALBUMIN 1.9*   No results for input(s): LIPASE, AMYLASE in the last 168 hours. No results for input(s): AMMONIA in the  last 168 hours.  Coagulation Profile: No results for input(s): INR, PROTIME in the last 168 hours.  Cardiac Enzymes: No results for input(s): CKTOTAL, CKMB, CKMBINDEX, TROPONINI in the last 168 hours.  BNP (last 3 results) No results for input(s): PROBNP in the last 8760 hours.  HbA1C: No results for input(s): HGBA1C in the last 72 hours.  CBG:  Recent Labs Lab 01/20/16 1722  GLUCAP 99    Lipid Profile: No results for input(s): CHOL, HDL, LDLCALC, TRIG, CHOLHDL, LDLDIRECT in the last 72 hours.  Thyroid Function Tests: No results for input(s): TSH, T4TOTAL, FREET4, T3FREE, THYROIDAB in the last 72 hours.  Anemia Panel: No results for input(s): VITAMINB12, FOLATE, FERRITIN, TIBC, IRON, RETICCTPCT in the last 72 hours.  Urine analysis:    Component Value Date/Time   COLORURINE YELLOW  01/20/2016 2209   APPEARANCEUR CLOUDY (A) 01/20/2016 2209   LABSPEC 1.015 01/20/2016 2209   PHURINE 7.0 01/20/2016 2209   GLUCOSEU NEGATIVE 01/20/2016 2209   HGBUR NEGATIVE 01/20/2016 2209   BILIRUBINUR NEGATIVE 01/20/2016 2209   KETONESUR NEGATIVE 01/20/2016 2209   PROTEINUR NEGATIVE 01/20/2016 2209   UROBILINOGEN 0.2 08/04/2014 2100   NITRITE NEGATIVE 01/20/2016 2209   LEUKOCYTESUR TRACE (A) 01/20/2016 2209    Sepsis Labs: Lactic Acid, Venous    Component Value Date/Time   LATICACIDVEN 1.06 01/20/2016 1736    MICROBIOLOGY: Recent Results (from the past 240 hour(s))  Culture, blood (Routine x 2)     Status: None (Preliminary result)   Collection Time: 01/20/16  5:18 PM  Result Value Ref Range Status   Specimen Description BLOOD LEFT FOREARM  Final   Special Requests BOTTLES DRAWN AEROBIC AND ANAEROBIC 5CC  Final   Culture NO GROWTH 3 DAYS  Final   Report Status PENDING  Incomplete  Culture, blood (Routine x 2)     Status: None (Preliminary result)   Collection Time: 01/20/16  5:57 PM  Result Value Ref Range Status   Specimen Description BLOOD RIGHT ANTECUBITAL  Final   Special Requests BOTTLES DRAWN AEROBIC AND ANAEROBIC 5CC  Final   Culture NO GROWTH 3 DAYS  Final   Report Status PENDING  Incomplete  Urine culture     Status: Abnormal   Collection Time: 01/20/16 10:09 PM  Result Value Ref Range Status   Specimen Description URINE, RANDOM  Final   Special Requests NONE  Final   Culture 10,000 COLONIES/mL YEAST (A)  Final   Report Status 01/22/2016 FINAL  Final    RADIOLOGY STUDIES/RESULTS: Ct Soft Tissue Neck W Contrast  Result Date: 01/21/2016 CLINICAL DATA:  82 y/o  F; enlarged right parotid gland with sepsis. EXAM: CT NECK WITH CONTRAST TECHNIQUE: Multidetector CT imaging of the neck was performed using the standard protocol following the bolus administration of intravenous contrast. CONTRAST:  129mL ISOVUE-300 IOPAMIDOL (ISOVUE-300) INJECTION 61% COMPARISON:  100  cc Isovue-300 FINDINGS: Pharynx and larynx: Normal. No mass or swelling. Salivary glands: There is asymmetric enhancement of the right parotid gland both superficial and deep lobes and ill-defined pattern and mild surrounding inflammatory changes of the subcutaneous fat compatible with parotiditis. Thyroid: Normal. Lymph nodes: None enlarged or abnormal density. Vascular: Mild calcific atherosclerosis of carotid bifurcations without significant stenosis. Brief retropharyngeal submucosal course of right internal carotid artery. Carotid and vertebral arteries of the neck as well as internal jugular veins are patent. Atherosclerosis of aortic arch with moderate calcification. Limited intracranial: Negative. Visualized orbits: Negative. Mastoids and visualized paranasal sinuses: Clear. Under pneumatized mastoid air cells  probably represents sequelae of chronic otomastoiditis. Skeleton: No acute osseous abnormality is evident. Multilevel cervical degenerative changes with both disc osteophyte complexes and facet arthrosis. Grade 1 C3-4 retrolisthesis and C4-5 anterolisthesis on a degenerative basis. Multilevel mild to moderate bony foraminal narrowing. At least moderate canal stenosis at the C3-4 level. Upper chest: Small right-sided pleural effusion and dependent opacity which may represent atelectasis or pneumonia, partially visualized. Other: None. IMPRESSION: 1. Findings consistent with right parotiditis. No abscess identified. No significant lymphadenopathy. 2. Partially visualized small right pleural effusion and dependent right upper lobe opacity which may represent associated pneumonia or atelectasis. Electronically Signed   By: Kristine Garbe M.D.   On: 01/21/2016 02:28   Ct Abdomen Pelvis W Contrast  Result Date: 01/21/2016 CLINICAL DATA:  Acute onset of fever. Failure to thrive and somnolence. Question of sepsis. Initial encounter. EXAM: CT ABDOMEN AND PELVIS WITH CONTRAST TECHNIQUE:  Multidetector CT imaging of the abdomen and pelvis was performed using the standard protocol following bolus administration of intravenous contrast. CONTRAST:  18mL ISOVUE-300 IOPAMIDOL (ISOVUE-300) INJECTION 61% COMPARISON:  CT of the abdomen and pelvis from 12/18/2015 FINDINGS: Lower chest: Small bilateral pleural effusions are noted. Bibasilar airspace opacities may reflect atelectasis or possibly infection. The visualized portions of the mediastinum are unremarkable in appearance. Mild calcification noted at the aortic valve. A large right-sided hiatal hernia is noted, containing most of the stomach. Hepatobiliary: Mild soft tissue inflammation about the gallbladder is nonspecific, given the soft tissue inflammation about the abdomen related to the patient's large postoperative defect. The liver is unremarkable in appearance. The common bile duct is dilated to 1.4 cm, of uncertain significance. Pancreas: The pancreas is within normal limits. Spleen: The spleen is unremarkable in appearance. Adrenals/Urinary Tract: The adrenal glands are unremarkable in appearance. The kidneys are within normal limits. There is no evidence of hydronephrosis. No renal or ureteral stones are identified. No perinephric stranding is seen. Stomach/Bowel: Postoperative change is noted at the right lower quadrant. Mild soft tissue inflammation is suggested about the transverse colon. The colon is largely filled with stool. Scattered diverticulosis is noted along the distal sigmoid colon. Apparent wall thickening at the rectum could reflect mild proctitis. Mild presacral stranding is noted. Visualized small bowel loops are grossly unremarkable. The stomach is otherwise unremarkable. Vascular/Lymphatic: Scattered calcification is seen along the abdominal aorta and its branches. The abdominal aorta is otherwise grossly unremarkable. The inferior vena cava is grossly unremarkable. No retroperitoneal lymphadenopathy is seen. No pelvic  sidewall lymphadenopathy is identified. Reproductive: The bladder is mildly distended. Mild soft tissue inflammation is suggested about the bladder. The patient is status post hysterectomy. No suspicious adnexal masses are seen. Other: A very large anterior abdominal wall defect is noted, with packing material and underlying soft tissue thickening. Musculoskeletal: No acute osseous abnormalities are identified. Right convex thoracolumbar scoliosis is noted. The visualized musculature is unremarkable in appearance. IMPRESSION: 1. Apparent wall thickening at the rectum could reflect mild proctitis. 2. Small bilateral pleural effusions noted. Bibasilar airspace opacities may reflect atelectasis or possibly infection. 3. Large right-sided hiatal hernia, containing most of the stomach. 4. Mild soft tissue inflammation suggested about the bladder. Would correlate for any evidence of cystitis. 5. Nonspecific soft tissue inflammation suggested about the transverse colon. 6. Mild inflammation about the gallbladder is nonspecific, given the underlying diffuse soft tissue inflammation about the abdomen. 7. Very large anterior abdominal wall defect again noted, with packing material and underlying soft tissue thickening. 8. Scattered diverticulosis along the distal  sigmoid colon, without evidence of diverticulitis. 9. Scattered aortic atherosclerosis noted. 10. Right convex thoracolumbar scoliosis noted. Electronically Signed   By: Garald Balding M.D.   On: 01/21/2016 02:35   Dg Chest Port 1 View  Result Date: 01/20/2016 CLINICAL DATA:  fever EXAM: PORTABLE CHEST 1 VIEW COMPARISON:  12/25/2015 FINDINGS: Right-sided PICC line has been removed. The heart is enlarged. Stable elevation of right hemidiaphragm. There is interstitial edema. There has been improvement aeration of the right lung base. No new consolidation. IMPRESSION: 1. Cardiomegaly and mild interstitial edema. 2. Improved aeration. Electronically Signed   By:  Nolon Nations M.D.   On: 01/20/2016 18:28   Dg Chest Port 1 View  Result Date: 12/25/2015 CLINICAL DATA:  Productive cough and shortness of breath. Surgery on 12/22/2015 for infected ventral hernia mesh and small bowel obstruction. EXAM: PORTABLE CHEST 1 VIEW COMPARISON:  CT of the abdomen 12/18/2015 and esophagram on 12/20/2015. FINDINGS: Chronic opacity in the right lower chest likely relates to the known large hiatal/paraesophageal hernia. There may be a developing subtle infiltrate in the left lower lung. Lung volumes are low. The heart size is stable. PICC line tip is in the SVC. IMPRESSION: Possible developing left lower lung infiltrate. Opacity in the right chest likely relates to the known large hiatal/paraesophageal hernia. Electronically Signed   By: Aletta Edouard M.D.   On: 12/25/2015 11:41     LOS: 3 days   Oren Binet, MD  [facility] Pager:[phone]  If 7PM-7AM, please contact night-coverage [email or web address] Password [email or web address] 01/23/2016, 12:54 PM

## 2016-01-23 NOTE — Consult Note (Signed)
Consultation Note Date: 01/23/2016   Patient Name: Monique Russo  DOB: [birth year]  MRN: [id number]  Age / Sex: 82 y.o., female  PCP: [name], MD Referring Physician: Jonetta Osgood, MD  Reason for Consultation: Establishing goals of care  HPI/Patient Profile: 82 y.o. female     admitted on 01/20/2016    Clinical Assessment and Goals of Care:  82 yo elderly lady, past medical history significant for chronic pain on methadone, chronic non-healing ventral wound with recent SBO requiring ex-lapwho presents from SNF for evaluation of sepsis along with acute encephalopathy and right-sided parotitis. Recently underwent ex lap, removal of mesh, placement of Phasix mesh 12-21-15. Was at [facility] from 10-19 to 01-20-16. Patient has been admitted with sepsis, presumed from R sided parotitis. Patient is on chronic PO methadone. She is frail, at times has been confused, not eating, not able to get up out of bed. A palliative consult has been placed for additional goals of care discussions.   The patient is resting in bed, she is currently awake. Her husband is at the bedside. Patient has 4 children, 2 sons and 2 daughters who live in the area but not locally. The patient is awake alert, she complains of her back itching, currently thinks her pain is reasonably well controlled.   I introduced myself and palliative care as follows: Palliative medicine is specialized medical care for people living with serious illness. It focuses on providing relief from the symptoms and stress of a serious illness. The goal is to improve quality of life for both the patient and the family.  See recommendations below, thank you for the consult.   NEXT OF KIN  husband  SUMMARY OF RECOMMENDATIONS    DNR DNI SNF rehab with palliative services through [facility] [place] on D/C Continue current pain regimen, agree with reduced  dose PO Methadone. Husband does not want patient to go to [facility]. ( patient came from [facility] after she was sent there for rehab post recent hospitalization)  Code Status/Advance Care Planning:  DNR    Symptom Management:    continue current treatment.   Palliative Prophylaxis:   Bowel Regimen  Psycho-social/Spiritual:   Desire for further Chaplaincy support:no  Additional Recommendations: Caregiving  Support/Resources  Prognosis:   Unable to determine  Discharge Planning: [place] for rehab with Palliative care service follow-up      Primary Diagnoses: Present on Admission: . Sepsis ([place]) . Chronic pain syndrome . DNR (do not resuscitate) . Hyponatremia . Acute blood loss anemia   I have reviewed the medical record, interviewed the patient and family, and examined the patient. The following aspects are pertinent.  Past Medical History:  Diagnosis Date  . Anemia   . Arthritis   . CHF (congestive heart failure) ([place])   . Depression   . GERD (gastroesophageal reflux disease)   . Hyponatremia 01/2016  . Perforated chronic gastric ulcer ([place]) 08/11/2014  . Pneumonia   . Pulmonary arterial hypertension 11/28/2014   Social History   Social  History  . Marital status: Married    Spouse name: N/A  . Number of children: N/A  . Years of education: N/A   Social History Main Topics  . Smoking status: Never Smoker  . Smokeless tobacco: Never Used  . Alcohol use 4.2 oz/week    7 Glasses of wine per week     Comment: 8 oz -12 oz of wine every   . Drug use: No  . Sexual activity: Not Asked   Other Topics Concern  . None   Social History Narrative  . None   Family History  Problem Relation Age of Onset  . Stroke Mother   . Breast cancer Sister   . Ulcerative colitis Daughter   . Chronic fatigue Daughter   . Chronic fatigue Daughter    Scheduled Meds: . DULoxetine  60 mg Oral QHS  . enoxaparin (LOVENOX) injection  40 mg  Subcutaneous Q24H  . esomeprazole  20 mg Oral Daily  . feeding supplement (PRO-STAT SUGAR FREE 64)  30 mL Oral TID  . lactose free nutrition  237 mL Oral BID BM  . magnesium hydroxide  30 mL Oral QODAY  . magnesium oxide  400 mg Oral Daily  . methadone  5 mg Oral Q12H  . polyethylene glycol  17 g Oral Daily  . senna  2 tablet Oral QHS  . timolol  1 drop Both Eyes BID  . vancomycin  1,250 mg Intravenous Q24H   Continuous Infusions: PRN Meds:.acetaminophen **OR** acetaminophen, bisacodyl, diclofenac sodium, menthol-cetylpyridinium, ondansetron **OR** ondansetron (ZOFRAN) IV Medications Prior to Admission:  Prior to Admission medications   Medication Sig Start Date End Date Taking? Authorizing Provider  acetaminophen (TYLENOL) 325 MG tablet Take 325 mg by mouth every 6 (six) hours as needed for mild pain.  07/10/13  Yes Marianne L York, PA-C  alum & mag hydroxide-simeth (MAALOX/MYLANTA) 200-200-20 MG/5ML suspension Take 15 mLs by mouth as needed for indigestion or heartburn. Patient taking differently: Take 15 mLs by mouth daily as needed for heartburn.  01/06/16  Yes Doreatha Lew, MD  Amino Acids-Protein Hydrolys (PRO-STAT) LIQD Take 30 mLs by mouth 3 (three) times daily. TO PROMOTE WOUND HEALING   Yes Historical Provider, MD  Ascorbic Acid (VITAMIN C) 1000 MG tablet Take 1,000 mg by mouth daily.   Yes Historical Provider, MD  b complex vitamins tablet Take 1 tablet by mouth daily as needed (for supplement).    Yes Historical Provider, MD  bisacodyl (DULCOLAX) 10 MG suppository Place 1 suppository (10 mg total) rectally daily as needed for moderate constipation (May repeat times one). Patient taking differently: Place 10 mg rectally daily as needed for moderate constipation.  07/10/13  Yes Marianne L York, PA-C  budesonide (RHINOCORT AQUA) 32 MCG/ACT nasal spray Place 1 spray into both nostrils daily as needed for rhinitis.   Yes Historical Provider, MD  cholecalciferol (VITAMIN D) 1000  UNITS tablet Take 1,000 Units by mouth daily.   Yes Historical Provider, MD  diclofenac sodium (VOLTAREN) 1 % GEL Apply topically every three (3) days as needed (For Leg pain).    Yes Historical Provider, MD  DULoxetine (CYMBALTA) 60 MG capsule Take 60 mg by mouth at bedtime.    Yes Historical Provider, MD  Esomeprazole Magnesium (NEXIUM 24HR PO) Take 22.3 mg by mouth daily.   Yes Historical Provider, MD  furosemide (LASIX) 80 MG tablet Take 80 mg by mouth daily as needed for fluid.    Yes Historical Provider, MD  gabapentin (  NEURONTIN) 800 MG tablet Take 800 mg by mouth 2 (two) times daily. 09/11/15  Yes Historical Provider, MD  LORazepam (ATIVAN) 1 MG tablet Take 1 tablet (1 mg total) by mouth every 4 (four) hours as needed for anxiety. 12/30/15  Yes Reyne Dumas, MD  Magnesium 400 MG TABS Take 400 mg by mouth daily.   Yes Historical Provider, MD  magnesium hydroxide (MILK OF MAGNESIA) 400 MG/5ML suspension Take 30 mLs by mouth every other day. FOR CONSTIPATION   Yes Historical Provider, MD  methadone (DOLOPHINE) 5 MG tablet Take 5 mg by mouth every 8 (eight) hours. FOR CHRONIC PAIN (0600, 1400, 2200 per [facility])   Yes Historical Provider, MD  methocarbamol (ROBAXIN) 750 MG tablet Take 750 mg by mouth every 8 (eight) hours as needed for muscle spasms.    Yes Historical Provider, MD  Morphine Sulfate (MORPHINE CONCENTRATE) 10 MG/0.5ML SOLN concentrated solution Take 0.25 mLs (5 mg total) by mouth every 2 (two) hours as needed for severe pain. Patient taking differently: Take 5 mg by mouth every 2 (two) hours as needed (for pain).  12/30/15  Yes Reyne Dumas, MD  Multiple Vitamins-Iron (MULTIVITAMINS WITH IRON) TABS tablet Take 1 tablet by mouth daily with supper. 08/12/14  Yes Earnstine Regal, PA-C  Omega-3 Fatty Acids (FISH OIL) 1000 MG CPDR Take 1,000 mg by mouth daily.    Yes Historical Provider, MD  oxyCODONE (ROXICODONE) 15 MG immediate release tablet Take 15 mg by mouth every 8 (eight) hours as  needed for pain. 12/03/15  Yes Historical Provider, MD  polyethylene glycol (MIRALAX / GLYCOLAX) packet Take 17 g by mouth daily as needed for mild constipation.   Yes Historical Provider, MD  potassium chloride SA (K-DUR,KLOR-CON) 20 MEQ tablet Take 20 mEq by mouth daily as needed (Only takes with lasix).   Yes Historical Provider, MD  timolol (BETIMOL) 0.5 % ophthalmic solution Place 1 drop into both eyes 2 (two) times daily.    Yes Historical Provider, MD  vitamin E 400 UNIT capsule Take 800 Units by mouth daily.    Yes Historical Provider, MD  lactose free nutrition (BOOST PLUS) LIQD Take 237 mLs by mouth 2 (two) times daily between meals. Patient not taking: Reported on 01/20/2016 08/12/14   Earnstine Regal, PA-C   Allergies  Allergen Reactions  . Tape Other (See Comments)    SKIN IS VERY THIN; IT BRUISES AND TEARS EASILY!! -THX   Review of Systems Complains of her back itching.   Physical Exam Awake alert NAD Appears weak deconditioned S1 S2   Edema trace Abdominal dressing.   Vital Signs: BP (!) 94/47 (BP Location: Right Arm)   Pulse 62   Temp 97.6 F (36.4 C) (Oral)   Resp 17   Ht 5' (1.524 m)   Wt 76.1 kg (167 lb 12.3 oz)   SpO2 99%   BMI 32.77 kg/m  Pain Assessment: No/denies pain POSS *See Group Information*: S-Acceptable,Sleep, easy to arouse Pain Score: Asleep   SpO2: SpO2: 99 % O2 Device:SpO2: 99 % O2 Flow Rate: .O2 Flow Rate (L/min): 2 L/min  IO: Intake/output summary:  Intake/Output Summary (Last 24 hours) at 01/23/16 1638 Last data filed at 01/23/16 0850  Gross per 24 hour  Intake              470 ml  Output              241 ml  Net  229 ml    LBM: Last BM Date: 01/23/16 Baseline Weight: Weight: 89.4 kg (197 lb) Most recent weight: Weight: 76.1 kg (167 lb 12.3 oz)     Palliative Assessment/Data:   Flowsheet Rows   Flowsheet Row Most Recent Value  Intake Tab  Referral Department  Hospitalist  Unit at Time of Referral  Med/Surg  Unit  Palliative Care Primary Diagnosis  Other (Comment) [sepsis parotitis. ]  Palliative Care Type  New Palliative care  Reason for referral  Clarify Goals of Care  Clinical Assessment  Palliative Performance Scale Score  40%  Pain Max last 24 hours  5  Pain Min Last 24 hours  4  Dyspnea Max Last 24 Hours  4  Dyspnea Min Last 24 hours  3  Nausea Max Last 24 Hours  3  Nausea Min Last 24 Hours  2  Psychosocial & Spiritual Assessment  Palliative Care Outcomes  Patient/Family meeting held?  Yes  Who was at the meeting?  patient husband.   Palliative Care Outcomes  Clarified goals of care  Palliative Care follow-up planned  Yes, Facility      Time In:  [id number] Time Out:  1630 Time Total:  60 min  Greater than 50%  of this time was spent counseling and coordinating care related to the above assessment and plan.  Signed by: Loistine Chance, MD  [phone]  Please contact Palliative Medicine Team phone at [phone] for questions and concerns.  For individual provider: See Shea Evans

## 2016-01-24 DIAGNOSIS — R279 Unspecified lack of coordination: Secondary | ICD-10-CM | POA: Diagnosis not present

## 2016-01-24 DIAGNOSIS — R627 Adult failure to thrive: Secondary | ICD-10-CM | POA: Diagnosis not present

## 2016-01-24 DIAGNOSIS — S31109D Unspecified open wound of abdominal wall, unspecified quadrant without penetration into peritoneal cavity, subsequent encounter: Secondary | ICD-10-CM | POA: Diagnosis not present

## 2016-01-24 DIAGNOSIS — M6281 Muscle weakness (generalized): Secondary | ICD-10-CM | POA: Diagnosis not present

## 2016-01-24 DIAGNOSIS — A419 Sepsis, unspecified organism: Secondary | ICD-10-CM | POA: Diagnosis not present

## 2016-01-24 DIAGNOSIS — G894 Chronic pain syndrome: Secondary | ICD-10-CM | POA: Diagnosis not present

## 2016-01-24 DIAGNOSIS — R2681 Unsteadiness on feet: Secondary | ICD-10-CM | POA: Diagnosis not present

## 2016-01-24 DIAGNOSIS — M545 Low back pain: Secondary | ICD-10-CM | POA: Diagnosis not present

## 2016-01-24 DIAGNOSIS — S31109A Unspecified open wound of abdominal wall, unspecified quadrant without penetration into peritoneal cavity, initial encounter: Secondary | ICD-10-CM | POA: Diagnosis not present

## 2016-01-24 DIAGNOSIS — E871 Hypo-osmolality and hyponatremia: Secondary | ICD-10-CM | POA: Diagnosis not present

## 2016-01-24 DIAGNOSIS — K1120 Sialoadenitis, unspecified: Secondary | ICD-10-CM | POA: Diagnosis not present

## 2016-01-24 DIAGNOSIS — D649 Anemia, unspecified: Secondary | ICD-10-CM | POA: Diagnosis not present

## 2016-01-24 DIAGNOSIS — R1310 Dysphagia, unspecified: Secondary | ICD-10-CM | POA: Diagnosis not present

## 2016-01-24 LAB — PROCALCITONIN: Procalcitonin: <0.1 ng/mL

## 2016-01-24 MED ORDER — METHADONE HCL 5 MG PO TABS: 5.0000 mg | ORAL_TABLET | Freq: Two times a day (BID) | ORAL | 0 refills | Status: DC

## 2016-01-24 MED ORDER — LORAZEPAM 1 MG PO TABS: 1.0000 mg | ORAL_TABLET | Freq: Three times a day (TID) | ORAL | 0 refills | Status: DC | PRN

## 2016-01-24 MED ORDER — AMOXICILLIN-POT CLAVULANATE 875-125 MG PO TABS: 1.0000 | ORAL_TABLET | Freq: Two times a day (BID) | ORAL | Status: DC

## 2016-01-24 MED ORDER — SENNA 8.6 MG PO TABS: 2.0000 | ORAL_TABLET | Freq: Every day | ORAL | 0 refills | Status: DC

## 2016-01-24 MED ORDER — GABAPENTIN 600 MG PO TABS: 300.0000 mg | ORAL_TABLET | Freq: Two times a day (BID) | ORAL | Status: DC

## 2016-01-24 MED ORDER — OXYCODONE HCL 5 MG PO TABS: 5.0000 mg | ORAL_TABLET | Freq: Three times a day (TID) | ORAL | 0 refills | Status: DC | PRN

## 2016-01-24 NOTE — Care Management Note (Signed)
Case Management Note  Patient Details  Name: Jamaal Abeln MRN: [id number] Date of Birth: [birth year]  Subjective/Objective:                    Action/Plan:  DC to SNF as facilitated by CSW.   Expected Discharge Date:                  Expected Discharge Plan:  Skilled Nursing Facility  In-House Referral:  Clinical Social Work  Discharge planning Services  CM Consult  Post Acute Care Choice:  NA Choice offered to:  NA  DME Arranged:    DME Agency:     HH Arranged:    [place] Agency:     Status of Service:  Completed, signed off  If discussed at [employer], dates discussed:    Additional Comments:  Carles Collet, RN 01/24/2016, 10:07 AM

## 2016-01-24 NOTE — Clinical Social Work Placement (Signed)
   CLINICAL SOCIAL WORK PLACEMENT  NOTE  Date:  01/24/2016  Patient Details  Name: Monique Russo MRN: [id number] Date of Birth: [birth year]  Clinical Social Work is seeking post-discharge placement for this patient at the [place] level of care (*CSW will initial, date and re-position this form in  chart as items are completed):  Yes   Patient/family provided with [place] Work Department's list of facilities offering this level of care within the geographic area requested by the patient (or if unable, by the patient's family).  Yes   Patient/family informed of their freedom to choose among providers that offer the needed level of care, that participate in Medicare, Medicaid or managed care program needed by the patient, have an available bed and are willing to accept the patient.  Yes   Patient/family informed of [facility]'s ownership interest in [facility] and [facility], as well as of the fact that they are under no obligation to receive care at these facilities.  PASRR submitted to EDS on       PASRR number received on       Existing PASRR number confirmed on 01/24/16     FL2 transmitted to all facilities in geographic area requested by pt/family on 01/24/16     FL2 transmitted to all facilities within larger geographic area on       Patient informed that his/her managed care company has contracts with or will negotiate with certain facilities, including the following:        Yes   Patient/family informed of bed offers received.  Patient chooses bed at [facility], [place]     Physician recommends and patient chooses bed at      Patient to be transferred to [facility] on 01/24/16.  Patient to be transferred to facility by [facility]     Patient family notified on 01/24/16 of transfer.  Name of family member notified:  Bill     PHYSICIAN Please sign DNR     Additional Comment:     _______________________________________________ Jorge Ny, LCSW 01/24/2016, 1:24 PM

## 2016-01-24 NOTE — Care Management Important Message (Signed)
Important Message  Patient Details  Name: Monique Russo MRN: [id number] Date of Birth: 1930-12-25   Medicare Important Message Given:  Yes    Nathen May 01/24/2016, 12:15 PM

## 2016-01-24 NOTE — NC FL2 (Signed)
[place] LEVEL OF CARE SCREENING TOOL     IDENTIFICATION  Patient Name: Monique Russo Birthdate: October 26, 1930 Sex: female Admission Date (Current Location): 01/20/2016  [facility] and [place] Number:  Herbalist and Address:  [facility] [place], [place] [address] [place], [place] [address]      Provider Number: [id number]  Attending Physician Name and Address:  Jonetta Osgood, MD  Relative Name and Phone Number:       Current Level of Care: Hospital Recommended Level of Care: [place] Prior Approval Number:    Date Approved/Denied:   PASRR Number: [id number]  Discharge Plan: SNF    Current Diagnoses: Patient Active Problem List   Diagnosis Date Noted  . Sepsis ([place]) 01/20/2016  . Acute blood loss anemia 01/20/2016  . Hypotension   . Difficulty in walking, not elsewhere classified   . Disorder of sleep wake schedule   . Protein-calorie malnutrition, severe ([place])   . End of life care   . Goals of care, counseling/discussion   . Palliative care by specialist   . Chronic bilateral low back pain without sciatica   . Small bowel obstruction 12/18/2015  . Diverticulitis of large intestine without perforation or abscess without bleeding 12/18/2015  . SBO (small bowel obstruction) 12/18/2015  . Left ventricular dysfunction  reported in chart, not confirmed on review of echo 11/28/2014  . Pulmonary arterial hypertension 11/28/2014  . Perforated chronic gastric ulcer ([place]) 08/11/2014  . Encounter for intubation   . Chronic pain syndrome   . Hyponatremia 07/17/2014  . Fatigue 07/09/2013  . DNR (do not resuscitate) 07/07/2013  . Encounter for palliative care 07/07/2013  . Narcotic overdose 07/03/2013    Orientation RESPIRATION BLADDER Height & Weight     Self, Situation  Normal Incontinent Weight: 168 lb 3.4 oz (76.3 kg) Height:  5' (152.4 cm)  BEHAVIORAL SYMPTOMS/MOOD NEUROLOGICAL BOWEL NUTRITION STATUS   Continent Diet (please see DC summary)  AMBULATORY STATUS COMMUNICATION OF NEEDS Skin   Extensive Assist Verbally Wound Vac (MWF dressing changes)                       Personal Care Assistance Level of Assistance  Bathing, Dressing Bathing Assistance: Maximum assistance   Dressing Assistance: Maximum assistance     Functional Limitations Info             SPECIAL CARE FACTORS FREQUENCY  PT (By licensed PT), OT (By licensed OT)     PT Frequency: 5/wk OT Frequency: 5/wk            Contractures      Additional Factors Info  Code Status, Allergies, Psychotropic Code Status Info: DNR Allergies Info: tape Psychotropic Info: cymbalta         Current Medications (01/24/2016):  This is the current hospital active medication list Current Facility-Administered Medications  Medication Dose Route Frequency Provider Last Rate Last Dose  . acetaminophen (TYLENOL) tablet 650 mg  650 mg Oral Q6H PRN Edwin Dada, MD   650 mg at 01/24/16 0955   Or  . acetaminophen (TYLENOL) suppository 650 mg  650 mg Rectal Q6H PRN Edwin Dada, MD      . amoxicillin-clavulanate (AUGMENTIN) 875-125 MG per tablet 1 tablet  1 tablet Oral Q12H Shanker Kristeen Mans, MD      . bisacodyl (DULCOLAX) suppository 10 mg  10 mg Rectal Daily PRN Edwin Dada, MD      .  diclofenac sodium (VOLTAREN) 1 % transdermal gel 2 g  2 g Topical Q3 days PRN Edwin Dada, MD      . DULoxetine (CYMBALTA) DR capsule 60 mg  60 mg Oral QHS Edwin Dada, MD   60 mg at 01/23/16 2210  . enoxaparin (LOVENOX) injection 40 mg  40 mg Subcutaneous Q24H Edwin Dada, MD   40 mg at 01/23/16 2211  . esomeprazole (NEXIUM) capsule 20 mg  20 mg Oral Daily Edwin Dada, MD   20 mg at 01/24/16 0937  . feeding supplement (PRO-STAT SUGAR FREE 64) liquid 30 mL  30 mL Oral TID Edwin Dada, MD   30 mL at 01/24/16 0931  . lactose free nutrition (BOOST PLUS) liquid 237 mL  237  mL Oral BID BM Edwin Dada, MD   237 mL at 01/24/16 0931  . magnesium hydroxide (MILK OF MAGNESIA) suspension 30 mL  30 mL Oral QODAY Edwin Dada, MD   30 mL at 01/24/16 0930  . magnesium oxide (MAG-OX) tablet 400 mg  400 mg Oral Daily Edwin Dada, MD   400 mg at 01/24/16 0930  . menthol-cetylpyridinium (CEPACOL) lozenge 3 mg  1 lozenge Oral PRN Jonetta Osgood, MD      . methadone (DOLOPHINE) tablet 5 mg  5 mg Oral Q12H Edwin Dada, MD   5 mg at 01/24/16 0931  . ondansetron (ZOFRAN) tablet 4 mg  4 mg Oral Q6H PRN Edwin Dada, MD       Or  . ondansetron (ZOFRAN) injection 4 mg  4 mg Intravenous Q6H PRN Edwin Dada, MD      . polyethylene glycol (MIRALAX / GLYCOLAX) packet 17 g  17 g Oral Daily Jonetta Osgood, MD   17 g at 01/22/16 1002  . senna (SENOKOT) tablet 17.2 mg  2 tablet Oral QHS Jonetta Osgood, MD   17.2 mg at 01/23/16 2209  . timolol (TIMOPTIC) 0.5 % ophthalmic solution 1 drop  1 drop Both Eyes BID Edwin Dada, MD   1 drop at 01/24/16 0931     Discharge Medications: Please see discharge summary for a list of discharge medications.  Relevant Imaging Results:  Relevant Lab Results:   Additional Information ss#: [id number]  Jorge Ny, LCSW

## 2016-01-24 NOTE — Discharge Summary (Signed)
PATIENT DETAILS Name: Monique Russo Age: 82 y.o. Sex: female Date of Birth: [birth year] MRN: [id number]. Admitting Physician: Edwin Dada, MD [id number] [name], MD  Admit Date: 01/20/2016 Discharge date: 01/24/2016  Recommendations for Outpatient Follow-up:  1. Follow up with PCP in 1-2 weeks 2. Please obtain BMP/CBC in one week  3. Ensure follow-up by general surgery 4. Have decreased dosage of methadone, Neurontin and oxycodone as well. Suggest to minimize narcotics as much as possible and taper off methadone and Neurontin completely over the next few weeks 5. Ensure follow-up by palliative care while at SNF  Admitted From:  SNF  Disposition: SNF   Home Health: No  Equipment/Devices: Wound Vac  Discharge Condition: Gaurded  CODE STATUS: DNR  Diet recommendation:  Heart Healthy  Brief Summary: See H&P, Labs, Consult and Test reports for all details in brief, Patient is a 82 y.o. femalewith a past medical history significant for chronic pain on methadone, chronic non-healing ventral wound with recent SBO requiring ex-lapwho presents from SNF for evaluation of sepsis along with acute encephalopathy and Right-sided parotitis. By day of discharge, much more awake and alert, significant improvement in right-sided parotitis. See below for further details.  Brief Hospital Course: Sepsis: Improved, sepsis pathophysiology has resolved. Likely etiology is right-sided parotitis. CT of the abdomen negative for any acute abnormalities/infectious sources. Managed with empiric vancomycin and Zosyn, blood cultures continue to be negative so far, will transition to oral Augmentin. Although she has slight leukocytosis, she is significantly improved, suspect leukocytosis will will improve over the next few days. Suggest rechecking CBC and chemistry panel in 1 week.  Right-sided parotitis: Probably causing above. CT scan of neck does not show any abscess. Managed  with empiric vancomycin and Zosyn, by day of discharge significant improvement in her symptoms, she can hardly feel the parotid swelling at this time, it is significantly less tender. We will transition to Augmentin for a few more days to complete a 10 day course.   Acute encephalopathy: Resolved, she is awake and alert. Suspect multifactorial etiology-due to sepsis, parotitis, narcotics/Neurontin. Much more awake and alert after resolution of sepsis, and adjustment of methadone and Neurontin dosing.   s/p exploratory recent laparotomy, removal of mesh, placement of Phasix mesh, 12/21/15: General surgery followed patient closely during this hospital stay, wound VAC was placed. Spoke with general surgery PA-C-okay to discharge from their point of view. Plans are to discharge back to SNF with a wound VAC, general surgery will arrange for outpatient follow-up with them. Note, CT of the abdomen did not show any acute abnormalities.  Hyponatremia: Very mild, doubt any need for workup at this time. Follow periodically while at SNF  Anemia: Appears to have anemia secondary to chronic disease-likely worsened by acute illness. No evidence of bleeding at this time-follow hemoglobin periodically.  Chronic diastolic heart failure: Clinically compensated, follow weights-resume Lasix on discharge  Chronic pain syndrome: Continue methadone (dose reduced), Neurontin (dose reduced) and Cymbalta. Spoke with spouse over the phone on 11/6-explained that we are unable to abruptly stop narcotics due to the risk of withdrawal symptoms. Explained that Neurontin and methadone will need to be gradually tapered off. Continue MiraLAX for bowel regimen.  History of dysphagia: Recently was seen by speech therapy during her most recent admission-recommendations were for a dysphagia 3 diet-ensure speech therapy follow-up while at SNF.  GERD: Continue PPI  Malnutrition of severe degree: Continue supplements  Failure to  thrive syndrome: Major issue currently-mostly sleeping and not eating well  per RN-however seems to be much more awake, per RN and family she has started to eat a little bit more. Will need close follow-up at SNF.  Ethics/goals of care: Very frail-elderly female-nursing home resident- wheelchair-bound-with multiple medical problems-with failure to thrive syndrome-multiple readmissions in the recent past-coming in with right-sided parotitis and sepsis. Unfortunately, continues to exhibit failure to thrive syndrome -and is at risk of further hospitalization and decompensation. Overall prognosis is rather poor. She was seen by palliative care services during this hospital stay, recommendations are to continue with current measures and have tardive care follow patient while at SNF. Thankfully she has improved during this hospital stay, but continues to be very frail and deconditioned and will require further rehabilitation at [facility]. I spoke with the patient's husband over the phone on 11/6, and explained the above.   Procedures/Studies: None  Discharge Diagnoses:  Principal Problem:   Sepsis ([place]) Active Problems:   DNR (do not resuscitate)   Hyponatremia   Chronic pain syndrome   Acute blood loss anemia  Discharge Instructions:  Activity:  As tolerated with Full fall precautions use walker/cane & assistance as needed  Discharge Instructions    Call MD for:  persistant nausea and vomiting    Complete by:  As directed    Call MD for:  redness, tenderness, or signs of infection (pain, swelling, redness, odor or green/yellow discharge around incision site)    Complete by:  As directed    Diet - low sodium heart healthy    Complete by:  As directed    dysphagia 3 diet   Discharge wound care:    Complete by:  As directed    recommend BID wet to dry dressing change for 1-2 days until bleeding stops. Then resume vac (Mepitel contact layer to protect mesh, then one piece of white foam, then black  foam to 154mm cont suction, vac change MWF)   Increase activity slowly    Complete by:  As directed        Medication List    STOP taking these medications   methocarbamol 750 MG tablet Commonly known as:  ROBAXIN   morphine CONCENTRATE 10 MG/0.5ML Soln concentrated solution     TAKE these medications   acetaminophen 325 MG tablet Commonly known as:  TYLENOL Take 325 mg by mouth every 6 (six) hours as needed for mild pain.   alum & mag hydroxide-simeth 200-200-20 MG/5ML suspension Commonly known as:  MAALOX/MYLANTA Take 15 mLs by mouth as needed for indigestion or heartburn. What changed:  when to take this  reasons to take this   amoxicillin-clavulanate 875-125 MG tablet Commonly known as:  AUGMENTIN Take 1 tablet by mouth every 12 (twelve) hours. 5 more days from 01/24/16   b complex vitamins tablet Take 1 tablet by mouth daily as needed (for supplement).   bisacodyl 10 MG suppository Commonly known as:  DULCOLAX Place 1 suppository (10 mg total) rectally daily as needed for moderate constipation (May repeat times one). What changed:  reasons to take this   budesonide 32 MCG/ACT nasal spray Commonly known as:  RHINOCORT AQUA Place 1 spray into both nostrils daily as needed for rhinitis.   cholecalciferol 1000 units tablet Commonly known as:  VITAMIN D Take 1,000 Units by mouth daily.   diclofenac sodium 1 % Gel Commonly known as:  VOLTAREN Apply topically every three (3) days as needed (For Leg pain).   DULoxetine 60 MG capsule Commonly known as:  CYMBALTA Take 60 mg  by mouth at bedtime.   Fish Oil 1000 MG Cpdr Take 1,000 mg by mouth daily.   furosemide 80 MG tablet Commonly known as:  LASIX Take 80 mg by mouth daily as needed for fluid.   gabapentin 600 MG tablet Commonly known as:  NEURONTIN Take 0.5 tablets (300 mg total) by mouth 2 (two) times daily. What changed:  medication strength  how much to take   lactose free nutrition Liqd Take  237 mLs by mouth 2 (two) times daily between meals.   LORazepam 1 MG tablet Commonly known as:  ATIVAN Take 1 tablet (1 mg total) by mouth every 8 (eight) hours as needed for anxiety. What changed:  when to take this   Magnesium 400 MG Tabs Take 400 mg by mouth daily.   magnesium hydroxide 400 MG/5ML suspension Commonly known as:  MILK OF MAGNESIA Take 30 mLs by mouth every other day. FOR CONSTIPATION   methadone 5 MG tablet Commonly known as:  DOLOPHINE Take 1 tablet (5 mg total) by mouth every 12 (twelve) hours. FOR CHRONIC PAIN What changed:  when to take this  additional instructions   multivitamins with iron Tabs tablet Take 1 tablet by mouth daily with supper.   NEXIUM 24HR PO Take 22.3 mg by mouth daily.   oxyCODONE 5 MG immediate release tablet Commonly known as:  Oxy IR/ROXICODONE Take 1-2 tablets (5-10 mg total) by mouth every 8 (eight) hours as needed for pain. What changed:  medication strength  how much to take   polyethylene glycol packet Commonly known as:  MIRALAX / GLYCOLAX Take 17 g by mouth daily as needed for mild constipation.   potassium chloride SA 20 MEQ tablet Commonly known as:  K-DUR,KLOR-CON Take 20 mEq by mouth daily as needed (Only takes with lasix).   PRO-STAT Liqd Take 30 mLs by mouth 3 (three) times daily. TO PROMOTE WOUND HEALING   senna 8.6 MG Tabs tablet Commonly known as:  SENOKOT Take 2 tablets (17.2 mg total) by mouth at bedtime.   timolol 0.5 % ophthalmic solution Commonly known as:  BETIMOL Place 1 drop into both eyes 2 (two) times daily.   vitamin C 1000 MG tablet Take 1,000 mg by mouth daily.   vitamin E 400 UNIT capsule Take 800 Units by mouth daily.      Follow-up Information    BLACKMAN,DOUGLAS A, MD. Call.   Specialty:  General Surgery Why:  Your appointment is 02/09/2016 at 10:20am. Please arrive 15 minutes prior to your appointment to fill out necessary paperwork. Contact information: [address] [place] [address] [phone]          Allergies  Allergen Reactions  . Tape Other (See Comments)    SKIN IS VERY THIN; IT BRUISES AND TEARS EASILY!! -THX   Consultations:   general surgery  Other Procedures/Studies: Ct Soft Tissue Neck W Contrast  Result Date: 01/21/2016 CLINICAL DATA:  82 y/o  F; enlarged right parotid gland with sepsis. EXAM: CT NECK WITH CONTRAST TECHNIQUE: Multidetector CT imaging of the neck was performed using the standard protocol following the bolus administration of intravenous contrast. CONTRAST:  130mL ISOVUE-300 IOPAMIDOL (ISOVUE-300) INJECTION 61% COMPARISON:  100 cc Isovue-300 FINDINGS: Pharynx and larynx: Normal. No mass or swelling. Salivary glands: There is asymmetric enhancement of the right parotid gland both superficial and deep lobes and ill-defined pattern and mild surrounding inflammatory changes of the subcutaneous fat compatible with parotiditis. Thyroid: Normal. Lymph nodes: None enlarged or  abnormal density. Vascular: Mild calcific atherosclerosis of carotid bifurcations without significant stenosis. Brief retropharyngeal submucosal course of right internal carotid artery. Carotid and vertebral arteries of the neck as well as internal jugular veins are patent. Atherosclerosis of aortic arch with moderate calcification. Limited intracranial: Negative. Visualized orbits: Negative. Mastoids and visualized paranasal sinuses: Clear. Under pneumatized mastoid air cells probably represents sequelae of chronic otomastoiditis. Skeleton: No acute osseous abnormality is evident. Multilevel cervical degenerative changes with both disc osteophyte complexes and facet arthrosis. Grade 1 C3-4 retrolisthesis and C4-5 anterolisthesis on a degenerative basis. Multilevel mild to moderate bony foraminal narrowing. At least moderate canal stenosis at the C3-4 level. Upper chest: Small right-sided pleural effusion and dependent opacity which may represent  atelectasis or pneumonia, partially visualized. Other: None. IMPRESSION: 1. Findings consistent with right parotiditis. No abscess identified. No significant lymphadenopathy. 2. Partially visualized small right pleural effusion and dependent right upper lobe opacity which may represent associated pneumonia or atelectasis. Electronically Signed   By: Kristine Garbe M.D.   On: 01/21/2016 02:28   Ct Abdomen Pelvis W Contrast  Result Date: 01/21/2016 CLINICAL DATA:  Acute onset of fever. Failure to thrive and somnolence. Question of sepsis. Initial encounter. EXAM: CT ABDOMEN AND PELVIS WITH CONTRAST TECHNIQUE: Multidetector CT imaging of the abdomen and pelvis was performed using the standard protocol following bolus administration of intravenous contrast. CONTRAST:  182mL ISOVUE-300 IOPAMIDOL (ISOVUE-300) INJECTION 61% COMPARISON:  CT of the abdomen and pelvis from 12/18/2015 FINDINGS: Lower chest: Small bilateral pleural effusions are noted. Bibasilar airspace opacities may reflect atelectasis or possibly infection. The visualized portions of the mediastinum are unremarkable in appearance. Mild calcification noted at the aortic valve. A large right-sided hiatal hernia is noted, containing most of the stomach. Hepatobiliary: Mild soft tissue inflammation about the gallbladder is nonspecific, given the soft tissue inflammation about the abdomen related to the patient's large postoperative defect. The liver is unremarkable in appearance. The common bile duct is dilated to 1.4 cm, of uncertain significance. Pancreas: The pancreas is within normal limits. Spleen: The spleen is unremarkable in appearance. Adrenals/Urinary Tract: The adrenal glands are unremarkable in appearance. The kidneys are within normal limits. There is no evidence of hydronephrosis. No renal or ureteral stones are identified. No perinephric stranding is seen. Stomach/Bowel: Postoperative change is noted at the right lower quadrant. Mild  soft tissue inflammation is suggested about the transverse colon. The colon is largely filled with stool. Scattered diverticulosis is noted along the distal sigmoid colon. Apparent wall thickening at the rectum could reflect mild proctitis. Mild presacral stranding is noted. Visualized small bowel loops are grossly unremarkable. The stomach is otherwise unremarkable. Vascular/Lymphatic: Scattered calcification is seen along the abdominal aorta and its branches. The abdominal aorta is otherwise grossly unremarkable. The inferior vena cava is grossly unremarkable. No retroperitoneal lymphadenopathy is seen. No pelvic sidewall lymphadenopathy is identified. Reproductive: The bladder is mildly distended. Mild soft tissue inflammation is suggested about the bladder. The patient is status post hysterectomy. No suspicious adnexal masses are seen. Other: A very large anterior abdominal wall defect is noted, with packing material and underlying soft tissue thickening. Musculoskeletal: No acute osseous abnormalities are identified. Right convex thoracolumbar scoliosis is noted. The visualized musculature is unremarkable in appearance. IMPRESSION: 1. Apparent wall thickening at the rectum could reflect mild proctitis. 2. Small bilateral pleural effusions noted. Bibasilar airspace opacities may reflect atelectasis or possibly infection. 3. Large right-sided hiatal hernia, containing most of the stomach. 4. Mild soft tissue inflammation suggested about  the bladder. Would correlate for any evidence of cystitis. 5. Nonspecific soft tissue inflammation suggested about the transverse colon. 6. Mild inflammation about the gallbladder is nonspecific, given the underlying diffuse soft tissue inflammation about the abdomen. 7. Very large anterior abdominal wall defect again noted, with packing material and underlying soft tissue thickening. 8. Scattered diverticulosis along the distal sigmoid colon, without evidence of diverticulitis.  9. Scattered aortic atherosclerosis noted. 10. Right convex thoracolumbar scoliosis noted. Electronically Signed   By: Garald Balding M.D.   On: 01/21/2016 02:35   Dg Chest Port 1 View  Result Date: 01/20/2016 CLINICAL DATA:  fever EXAM: PORTABLE CHEST 1 VIEW COMPARISON:  12/25/2015 FINDINGS: Right-sided PICC line has been removed. The heart is enlarged. Stable elevation of right hemidiaphragm. There is interstitial edema. There has been improvement aeration of the right lung base. No new consolidation. IMPRESSION: 1. Cardiomegaly and mild interstitial edema. 2. Improved aeration. Electronically Signed   By: Nolon Nations M.D.   On: 01/20/2016 18:28   Dg Chest Port 1 View  Result Date: 12/25/2015 CLINICAL DATA:  Productive cough and shortness of breath. Surgery on 12/22/2015 for infected ventral hernia mesh and small bowel obstruction. EXAM: PORTABLE CHEST 1 VIEW COMPARISON:  CT of the abdomen 12/18/2015 and esophagram on 12/20/2015. FINDINGS: Chronic opacity in the right lower chest likely relates to the known large hiatal/paraesophageal hernia. There may be a developing subtle infiltrate in the left lower lung. Lung volumes are low. The heart size is stable. PICC line tip is in the SVC. IMPRESSION: Possible developing left lower lung infiltrate. Opacity in the right chest likely relates to the known large hiatal/paraesophageal hernia. Electronically Signed   By: Aletta Edouard M.D.   On: 12/25/2015 11:41     TODAY-DAY OF DISCHARGE:  Subjective:   Linford Arnold today has no headache,no chest abdominal pain,no new weakness tingling or numbness, feels much better  Objective:   Blood pressure (!) 114/58, pulse 70, temperature 98.5 F (36.9 C), temperature source Oral, resp. rate 18, height 5' (1.524 m), weight 76.3 kg (168 lb 3.4 oz), SpO2 98 %.  Intake/Output Summary (Last 24 hours) at 01/24/16 1035 Last data filed at 01/24/16 0938  Gross per 24 hour  Intake              240 ml  Output                 1 ml  Net              239 ml   Filed Weights   01/22/16 0113 01/23/16 0357 01/24/16 0511  Weight: 88.9 kg (196 lb) 76.1 kg (167 lb 12.3 oz) 76.3 kg (168 lb 3.4 oz)    Exam: Awake Alert, Oriented *3, No new F.N deficits, Normal affect [place].AT,PERRAL Supple Neck,No JVD, No cervical lymphadenopathy appriciated.  Symmetrical Chest wall movement, Good air movement bilaterally, CTAB RRR,No Gallops,Rubs or new Murmurs, No Parasternal Heave +ve B.Sounds, Abd Soft, Non tender, No organomegaly appriciated, No rebound -guarding or rigidity. No Cyanosis, Clubbing or edema, No new Rash or bruise   PERTINENT RADIOLOGIC STUDIES: Ct Soft Tissue Neck W Contrast  Result Date: 01/21/2016 CLINICAL DATA:  82 y/o  F; enlarged right parotid gland with sepsis. EXAM: CT NECK WITH CONTRAST TECHNIQUE: Multidetector CT imaging of the neck was performed using the standard protocol following the bolus administration of intravenous contrast. CONTRAST:  182mL ISOVUE-300 IOPAMIDOL (ISOVUE-300) INJECTION 61% COMPARISON:  100 cc Isovue-300 FINDINGS: Pharynx and larynx: Normal.  No mass or swelling. Salivary glands: There is asymmetric enhancement of the right parotid gland both superficial and deep lobes and ill-defined pattern and mild surrounding inflammatory changes of the subcutaneous fat compatible with parotiditis. Thyroid: Normal. Lymph nodes: None enlarged or abnormal density. Vascular: Mild calcific atherosclerosis of carotid bifurcations without significant stenosis. Brief retropharyngeal submucosal course of right internal carotid artery. Carotid and vertebral arteries of the neck as well as internal jugular veins are patent. Atherosclerosis of aortic arch with moderate calcification. Limited intracranial: Negative. Visualized orbits: Negative. Mastoids and visualized paranasal sinuses: Clear. Under pneumatized mastoid air cells probably represents sequelae of chronic otomastoiditis. Skeleton: No acute  osseous abnormality is evident. Multilevel cervical degenerative changes with both disc osteophyte complexes and facet arthrosis. Grade 1 C3-4 retrolisthesis and C4-5 anterolisthesis on a degenerative basis. Multilevel mild to moderate bony foraminal narrowing. At least moderate canal stenosis at the C3-4 level. Upper chest: Small right-sided pleural effusion and dependent opacity which may represent atelectasis or pneumonia, partially visualized. Other: None. IMPRESSION: 1. Findings consistent with right parotiditis. No abscess identified. No significant lymphadenopathy. 2. Partially visualized small right pleural effusion and dependent right upper lobe opacity which may represent associated pneumonia or atelectasis. Electronically Signed   By: Kristine Garbe M.D.   On: 01/21/2016 02:28   Ct Abdomen Pelvis W Contrast  Result Date: 01/21/2016 CLINICAL DATA:  Acute onset of fever. Failure to thrive and somnolence. Question of sepsis. Initial encounter. EXAM: CT ABDOMEN AND PELVIS WITH CONTRAST TECHNIQUE: Multidetector CT imaging of the abdomen and pelvis was performed using the standard protocol following bolus administration of intravenous contrast. CONTRAST:  161mL ISOVUE-300 IOPAMIDOL (ISOVUE-300) INJECTION 61% COMPARISON:  CT of the abdomen and pelvis from 12/18/2015 FINDINGS: Lower chest: Small bilateral pleural effusions are noted. Bibasilar airspace opacities may reflect atelectasis or possibly infection. The visualized portions of the mediastinum are unremarkable in appearance. Mild calcification noted at the aortic valve. A large right-sided hiatal hernia is noted, containing most of the stomach. Hepatobiliary: Mild soft tissue inflammation about the gallbladder is nonspecific, given the soft tissue inflammation about the abdomen related to the patient's large postoperative defect. The liver is unremarkable in appearance. The common bile duct is dilated to 1.4 cm, of uncertain significance.  Pancreas: The pancreas is within normal limits. Spleen: The spleen is unremarkable in appearance. Adrenals/Urinary Tract: The adrenal glands are unremarkable in appearance. The kidneys are within normal limits. There is no evidence of hydronephrosis. No renal or ureteral stones are identified. No perinephric stranding is seen. Stomach/Bowel: Postoperative change is noted at the right lower quadrant. Mild soft tissue inflammation is suggested about the transverse colon. The colon is largely filled with stool. Scattered diverticulosis is noted along the distal sigmoid colon. Apparent wall thickening at the rectum could reflect mild proctitis. Mild presacral stranding is noted. Visualized small bowel loops are grossly unremarkable. The stomach is otherwise unremarkable. Vascular/Lymphatic: Scattered calcification is seen along the abdominal aorta and its branches. The abdominal aorta is otherwise grossly unremarkable. The inferior vena cava is grossly unremarkable. No retroperitoneal lymphadenopathy is seen. No pelvic sidewall lymphadenopathy is identified. Reproductive: The bladder is mildly distended. Mild soft tissue inflammation is suggested about the bladder. The patient is status post hysterectomy. No suspicious adnexal masses are seen. Other: A very large anterior abdominal wall defect is noted, with packing material and underlying soft tissue thickening. Musculoskeletal: No acute osseous abnormalities are identified. Right convex thoracolumbar scoliosis is noted. The visualized musculature is unremarkable in appearance. IMPRESSION: 1.  Apparent wall thickening at the rectum could reflect mild proctitis. 2. Small bilateral pleural effusions noted. Bibasilar airspace opacities may reflect atelectasis or possibly infection. 3. Large right-sided hiatal hernia, containing most of the stomach. 4. Mild soft tissue inflammation suggested about the bladder. Would correlate for any evidence of cystitis. 5. Nonspecific  soft tissue inflammation suggested about the transverse colon. 6. Mild inflammation about the gallbladder is nonspecific, given the underlying diffuse soft tissue inflammation about the abdomen. 7. Very large anterior abdominal wall defect again noted, with packing material and underlying soft tissue thickening. 8. Scattered diverticulosis along the distal sigmoid colon, without evidence of diverticulitis. 9. Scattered aortic atherosclerosis noted. 10. Right convex thoracolumbar scoliosis noted. Electronically Signed   By: Garald Balding M.D.   On: 01/21/2016 02:35   Dg Chest Port 1 View  Result Date: 01/20/2016 CLINICAL DATA:  fever EXAM: PORTABLE CHEST 1 VIEW COMPARISON:  12/25/2015 FINDINGS: Right-sided PICC line has been removed. The heart is enlarged. Stable elevation of right hemidiaphragm. There is interstitial edema. There has been improvement aeration of the right lung base. No new consolidation. IMPRESSION: 1. Cardiomegaly and mild interstitial edema. 2. Improved aeration. Electronically Signed   By: Nolon Nations M.D.   On: 01/20/2016 18:28   Dg Chest Port 1 View  Result Date: 12/25/2015 CLINICAL DATA:  Productive cough and shortness of breath. Surgery on 12/22/2015 for infected ventral hernia mesh and small bowel obstruction. EXAM: PORTABLE CHEST 1 VIEW COMPARISON:  CT of the abdomen 12/18/2015 and esophagram on 12/20/2015. FINDINGS: Chronic opacity in the right lower chest likely relates to the known large hiatal/paraesophageal hernia. There may be a developing subtle infiltrate in the left lower lung. Lung volumes are low. The heart size is stable. PICC line tip is in the SVC. IMPRESSION: Possible developing left lower lung infiltrate. Opacity in the right chest likely relates to the known large hiatal/paraesophageal hernia. Electronically Signed   By: Aletta Edouard M.D.   On: 12/25/2015 11:41     PERTINENT LAB RESULTS: CBC:  Recent Labs  01/22/16 0631 01/23/16 0536  WBC 13.6*  15.1*  HGB 8.5* 8.7*  HCT 26.9* 27.7*  PLT 397 381   CMET CMP     Component Value Date/Time   NA 134 (L) 01/23/2016 0536   K 3.8 01/23/2016 0536   CL 100 (L) 01/23/2016 0536   CO2 28 01/23/2016 0536   GLUCOSE 92 01/23/2016 0536   BUN 8 01/23/2016 0536   CREATININE 0.62 01/23/2016 0536   CALCIUM 9.4 01/23/2016 0536   PROT 7.2 01/20/2016 1718   ALBUMIN 1.9 (L) 01/20/2016 1718   AST 25 01/20/2016 1718   ALT 14 01/20/2016 1718   ALKPHOS 130 (H) 01/20/2016 1718   BILITOT 0.3 01/20/2016 1718   GFRNONAA >60 01/23/2016 0536   GFRAA >60 01/23/2016 0536    GFR Estimated Creatinine Clearance: 48.6 mL/min (by C-G formula based on SCr of 0.62 mg/dL). No results for input(s): LIPASE, AMYLASE in the last 72 hours. No results for input(s): CKTOTAL, CKMB, CKMBINDEX, TROPONINI in the last 72 hours. Invalid input(s): POCBNP No results for input(s): DDIMER in the last 72 hours. No results for input(s): HGBA1C in the last 72 hours. No results for input(s): CHOL, HDL, LDLCALC, TRIG, CHOLHDL, LDLDIRECT in the last 72 hours. No results for input(s): TSH, T4TOTAL, T3FREE, THYROIDAB in the last 72 hours.  Invalid input(s): FREET3 No results for input(s): VITAMINB12, FOLATE, FERRITIN, TIBC, IRON, RETICCTPCT in the last 72 hours. Coags: No results for  input(s): INR in the last 72 hours.  Invalid input(s): PT Microbiology: Recent Results (from the past 240 hour(s))  Culture, blood (Routine x 2)     Status: None (Preliminary result)   Collection Time: 01/20/16  5:18 PM  Result Value Ref Range Status   Specimen Description BLOOD LEFT FOREARM  Final   Special Requests BOTTLES DRAWN AEROBIC AND ANAEROBIC 5CC  Final   Culture NO GROWTH 3 DAYS  Final   Report Status PENDING  Incomplete  Culture, blood (Routine x 2)     Status: None (Preliminary result)   Collection Time: 01/20/16  5:57 PM  Result Value Ref Range Status   Specimen Description BLOOD RIGHT ANTECUBITAL  Final   Special Requests  BOTTLES DRAWN AEROBIC AND ANAEROBIC 5CC  Final   Culture NO GROWTH 3 DAYS  Final   Report Status PENDING  Incomplete  Urine culture     Status: Abnormal   Collection Time: 01/20/16 10:09 PM  Result Value Ref Range Status   Specimen Description URINE, RANDOM  Final   Special Requests NONE  Final   Culture 10,000 COLONIES/mL YEAST (A)  Final   Report Status 01/22/2016 FINAL  Final    FURTHER DISCHARGE INSTRUCTIONS:  Get Medicines reviewed and adjusted: Please take all your medications with you for your next visit with your Primary MD  Laboratory/radiological data: Please request your Primary MD to go over all hospital tests and procedure/radiological results at the follow up, please ask your Primary MD to get all Hospital records sent to his/her office.  In some cases, they will be blood work, cultures and biopsy results pending at the time of your discharge. Please request that your primary care M.D. goes through all the records of your hospital data and follows up on these results.  Also Note the following: If you experience worsening of your admission symptoms, develop shortness of breath, life threatening emergency, suicidal or homicidal thoughts you must seek medical attention immediately by calling 911 or calling your MD immediately  if symptoms less severe.  You must read complete instructions/literature along with all the possible adverse reactions/side effects for all the Medicines you take and that have been prescribed to you. Take any new Medicines after you have completely understood and accpet all the possible adverse reactions/side effects.   Do not drive when taking Pain medications or sleeping medications (Benzodaizepines)  Do not take more than prescribed Pain, Sleep and Anxiety Medications. It is not advisable to combine anxiety,sleep and pain medications without talking with your primary care practitioner  Special Instructions: If you have smoked or chewed Tobacco  in  the last 2 yrs please stop smoking, stop any regular Alcohol  and or any Recreational drug use.  Wear Seat belts while driving.  Please note: You were cared for by a hospitalist during your hospital stay. Once you are discharged, your primary care physician will handle any further medical issues. Please note that NO REFILLS for any discharge medications will be authorized once you are discharged, as it is imperative that you return to your primary care physician (or establish a relationship with a primary care physician if you do not have one) for your post hospital discharge needs so that they can reassess your need for medications and monitor your lab values.  Total Time spent coordinating discharge including counseling, education and face to face time equals 45 minutes.  SignedOren Binet 01/24/2016 10:35 AM

## 2016-01-24 NOTE — Consult Note (Signed)
   [facility] CM Inpatient Consult   01/24/2016  Canyon Yando 1930/06/30 [id number]     Patient screened for potential [facility] Care Management services. Chart reviewed. Noted current discharge plan is for SNF.  There are no identifiable [facility] Care Management needs at this time. If patient's post hospital needs change, please place a [facility] Care Management consult. For questions please contact:  Marthenia Rolling, [place], RN,BSN [facility] Liaison [phone]

## 2016-01-24 NOTE — Consult Note (Addendum)
[facility] Nurse wound follow-up consult note: 0900 Surgical PA at the bedside to assess wound during the dressing change. Wound type: Chronic full thickness post-op wound to midline abd with appearance unchanged from Fri, but small amt dark red bleeding is a new development to wound edges from 5:00 o'clock to 9:00 o'clock, and this was noted prior to removing the Vac sponge.   Wound bed: Granulation tissue visible through mesh Drainage (amount, consistency, odor) Large amt yellow drainage in the cannister (450cc), no odor Periwound: Intact skin surrounding Dressing procedure/placement/frequency: Refer to [facility] progress notes for plan of care.  Applied Mepitel contact layer to protect mesh, then moist gauze dressing and ABD pad.  SNF can resume use of Vac dressing in 1-2 days after bleeding has resolved.  Pt tolerated dressing change with mod amt discomfort, on Friday she did not c/o pain. Please re-consult if further assistance is needed.  Thank-you,  Julien Girt MSN, [place], [place], [place], [place]

## 2016-01-24 NOTE — Progress Notes (Signed)
Patient will discharge to [facility] Anticipated discharge date: 11/6 Family notified: Doctor, hospital by [employer]- called at 1:20pm  CSW signing off.  Jorge Ny, LCSW Clinical Social Worker [phone]

## 2016-01-24 NOTE — Progress Notes (Signed)
CSW consulted to help with alternate SNF placement- [facility] can accept and pt husband is agreeable to this option- CSW initiated [employer] auth- awaiting approval  CSW will continue to follow- planning for DC today  Jorge Ny, [place] Social Worker [phone]

## 2016-01-24 NOTE — Progress Notes (Signed)
Patient ID: Monique Russo, female   DOB: 1930/12/14, 82 y.o.   MRN: [id number]  [facility] Surgery Progress Note     Subjective: No complaints today. Hopes to go to SNF soon  Objective: Vital signs in last 24 hours: Temp:  [97.6 F (36.4 C)-98.5 F (36.9 C)] 98.5 F (36.9 C) (11/06 0506) Pulse Rate:  [62-70] 70 (11/06 0506) Resp:  [17-18] 18 (11/06 0506) BP: (94-114)/(47-58) 114/58 (11/06 0506) SpO2:  [98 %-99 %] 98 % (11/06 0506) Weight:  [168 lb 3.4 oz (76.3 kg)] 168 lb 3.4 oz (76.3 kg) (11/06 0511) Last BM Date: 01/23/16  Intake/Output from previous day: 11/05 0701 - 11/06 0700 In: 0  Out: 2 [Urine:1; Stool:1] Intake/Output this shift: No intake/output data recorded.  PE: Gen:  Alert, NAD, pleasant Pulm:  CTAB Abd: Soft, NT/ND, +BS, wound with more bleeding than Friday:     Lab Results:   Recent Labs  01/22/16 0631 01/23/16 0536  WBC 13.6* 15.1*  HGB 8.5* 8.7*  HCT 26.9* 27.7*  PLT 397 381   BMET  Recent Labs  01/22/16 0631 01/23/16 0536  NA 133* 134*  K 3.9 3.8  CL 100* 100*  CO2 29 28  GLUCOSE 105* 92  BUN 12 8  CREATININE 0.64 0.62  CALCIUM 9.5 9.4   PT/INR No results for input(s): LABPROT, INR in the last 72 hours. CMP     Component Value Date/Time   NA 134 (L) 01/23/2016 0536   K 3.8 01/23/2016 0536   CL 100 (L) 01/23/2016 0536   CO2 28 01/23/2016 0536   GLUCOSE 92 01/23/2016 0536   BUN 8 01/23/2016 0536   CREATININE 0.62 01/23/2016 0536   CALCIUM 9.4 01/23/2016 0536   PROT 7.2 01/20/2016 1718   ALBUMIN 1.9 (L) 01/20/2016 1718   AST 25 01/20/2016 1718   ALT 14 01/20/2016 1718   ALKPHOS 130 (H) 01/20/2016 1718   BILITOT 0.3 01/20/2016 1718   GFRNONAA >60 01/23/2016 0536   GFRAA >60 01/23/2016 0536   Lipase     Component Value Date/Time   LIPASE 12 (L) 08/06/2014 2020       Studies/Results: No results found.  Anti-infectives: Anti-infectives    Start     Dose/Rate Route Frequency Ordered Stop   01/21/16  0500  vancomycin (VANCOCIN) 1,250 mg in sodium chloride 0.9 % 250 mL IVPB     1,250 mg 166.7 mL/hr over 90 Minutes Intravenous Every 24 hours 01/20/16 1843     01/21/16 0300  piperacillin-tazobactam (ZOSYN) IVPB 3.375 g  Status:  Discontinued     3.375 g 12.5 mL/hr over 240 Minutes Intravenous Every 8 hours 01/20/16 1842 01/23/16 1155   01/20/16 1745  piperacillin-tazobactam (ZOSYN) IVPB 3.375 g     3.375 g 100 mL/hr over 30 Minutes Intravenous  Once 01/20/16 1735 01/20/16 1833   01/20/16 1745  vancomycin (VANCOCIN) IVPB 1000 mg/200 mL premix     1,000 mg 200 mL/hr over 60 Minutes Intravenous  Once 01/20/16 1735 01/20/16 1903       Assessment/Plan S/p exploratory laparotomy, removal of mesh, placement of Phasix mesh, 12/21/15 douglas blackman, MD - CT 01/20/16 not significant for inta-abdominal source of infection - wound care per WOC. More bleeding today therefore recommend BID wet to dry dressing change for 1-2 days until bleeding stopes. Then resume vac (Mepitel contact layer to protect mesh, then one piece of white foam, then black foam to 136mm cont suction, vac change MWF)  Sepsis Hyponatremia Chronic pain  Severe malnutrition Chronic diastolic CHF  FEN: Regular diet, Boost, Pro-stat  ID: Zosyn 11/2 >>11/5, Vanc 11/2 >> VTE: lovenox, SCD's  Plan - ready for discharge from a surgery standpoint. Once daily wet to dry dressing changes for 1-2 days until bleeding stops, then continue wound vac as before (changes MWF). Follow-up with Dr Ninfa Linden in about 2-3 weeks  Patient is DNR   LOS: 4 days    Jerrye Beavers , [facility] Surgery 01/24/2016, 9:05 AM Pager: [phone] Consults: [phone] Mon-Fri 7:00 am-4:30 pm Sat-Sun 7:00 am-11:30 am

## 2016-01-24 NOTE — Progress Notes (Signed)
Is much more awake and alert this morning Hardly any pain in the right mandibular area this morning-parotid swelling seems to have some improvement. Sister at bedside-acknowledges improvement as well Spoke with the surgical team-okay for discharge to arrange for outpatient follow-up.

## 2016-01-25 LAB — CULTURE, BLOOD (ROUTINE X 2)
Culture: NO GROWTH
Culture: NO GROWTH

## 2016-01-27 DIAGNOSIS — G894 Chronic pain syndrome: Secondary | ICD-10-CM | POA: Diagnosis not present

## 2016-01-27 DIAGNOSIS — S31109A Unspecified open wound of abdominal wall, unspecified quadrant without penetration into peritoneal cavity, initial encounter: Secondary | ICD-10-CM | POA: Diagnosis not present

## 2016-01-27 DIAGNOSIS — K1120 Sialoadenitis, unspecified: Secondary | ICD-10-CM | POA: Diagnosis not present

## 2016-01-27 DIAGNOSIS — D649 Anemia, unspecified: Secondary | ICD-10-CM | POA: Diagnosis not present

## 2016-01-27 DIAGNOSIS — A419 Sepsis, unspecified organism: Secondary | ICD-10-CM | POA: Diagnosis not present

## 2016-01-31 DIAGNOSIS — D72829 Elevated white blood cell count, unspecified: Secondary | ICD-10-CM | POA: Diagnosis not present

## 2016-01-31 DIAGNOSIS — R509 Fever, unspecified: Secondary | ICD-10-CM | POA: Diagnosis not present

## 2016-02-01 DIAGNOSIS — J189 Pneumonia, unspecified organism: Secondary | ICD-10-CM | POA: Diagnosis not present

## 2016-02-01 DIAGNOSIS — R0902 Hypoxemia: Secondary | ICD-10-CM | POA: Diagnosis not present

## 2016-02-01 DIAGNOSIS — G8929 Other chronic pain: Secondary | ICD-10-CM | POA: Diagnosis not present

## 2016-02-11 DIAGNOSIS — A419 Sepsis, unspecified organism: Secondary | ICD-10-CM | POA: Diagnosis not present

## 2016-02-14 ENCOUNTER — Encounter: Payer: Self-pay | Admitting: Internal Medicine

## 2016-02-14 ENCOUNTER — Non-Acute Institutional Stay (SKILLED_NURSING_FACILITY): Payer: PPO | Admitting: Internal Medicine

## 2016-02-14 DIAGNOSIS — R5381 Other malaise: Secondary | ICD-10-CM | POA: Diagnosis not present

## 2016-02-14 DIAGNOSIS — D638 Anemia in other chronic diseases classified elsewhere: Secondary | ICD-10-CM | POA: Diagnosis not present

## 2016-02-14 DIAGNOSIS — F1190 Opioid use, unspecified, uncomplicated: Secondary | ICD-10-CM

## 2016-02-14 DIAGNOSIS — Z9889 Other specified postprocedural states: Secondary | ICD-10-CM

## 2016-02-14 DIAGNOSIS — G894 Chronic pain syndrome: Secondary | ICD-10-CM | POA: Diagnosis not present

## 2016-02-14 DIAGNOSIS — K1120 Sialoadenitis, unspecified: Secondary | ICD-10-CM | POA: Diagnosis not present

## 2016-02-14 DIAGNOSIS — I959 Hypotension, unspecified: Secondary | ICD-10-CM | POA: Diagnosis not present

## 2016-02-14 DIAGNOSIS — K219 Gastro-esophageal reflux disease without esophagitis: Secondary | ICD-10-CM

## 2016-02-14 DIAGNOSIS — E43 Unspecified severe protein-calorie malnutrition: Secondary | ICD-10-CM | POA: Diagnosis not present

## 2016-02-14 NOTE — Progress Notes (Signed)
Patient ID: Monique Russo, female   DOB: Mar 05, 1931, 82 y.o.   MRN: [id number]    HISTORY AND PHYSICAL   DATE: 02/14/2016  Location:    [facility] Room Number: [place] of Service: SNF (31)   Extended Emergency Contact Information Primary Emergency Contact: Kahl,Bill Address: [address]          [place], [place] [address] [place] Phone: [phone] Mobile Phone: [phone] Relation: Spouse Secondary Emergency Contact: Faythe Ghee Address: [address]          [place], [place] [address] [place] of [place] Phone: [phone] Mobile Phone: [phone] Relation: Sister  Advanced Directive information Does Patient Have a Medical Advance Directive?: No  Chief Complaint  Patient presents with  . New Admit To SNF    HPI:  82 yo female seen today as a new admission into SNF from [facility]. She was d/c'd from hospital after 4 day stay for sepsis due to right parotitis, acute encephalopathy, mild hyponatremia, severe malnutrition, FTT, chronic pain syndrome, anemia and, prior to this admission, she underwent exploratory laparotomy with removal of mesh and placement of Phasix mesh 12/21/15. Infection of parotid gland tx with IV vanco/zosyn --> po augmentin. CT neck did not reveal abscess. BC neg growth. Narcotic medication doses adjusted with reduction in dose of methadone. She had wound vac to abdominal wound placed by gen sx prior to d/c. Palliative care consulted and [place] d/w spouse. Pt DNR at d/c. She rec'd 2 units PRBCs during admission in Oct 2017. Hgb 10.2-->8.7; WBC 15.1K; albumin 1.9; Na 131-->134; Cr 0.62 at d/c. She presents to SNF for short term rehab with potential for long term care.  Today, she reports no concerns. Appetite ok. Sleeps well. No falls. No nursing issues. Pain controlled. Tolerating tx. She is a poor historian due to memory loss. Hx obtained from chart  GERD/ hx dysphagia - stable on nexium, maalox  prn. She takes miralax for constipation  Anemia of chronic disease - current Hgb 8.7. She is s/p 2 units PRBCs in Oct 2017.  FTT/severe malnutrition - she received TPN during Oct 2017 hospital admission. Albumin 1.9. She gets nutritional supplements per facility protocol. She takes vitamins/minerals  Chronic pain syndrome due to generalized arthritis - pain stable on robaxin, roxicodone and methadone. Benefits of narcotic use outweighs risks. She also takes cymbalta and neurontin  Chronic dHF/hypotension - stable on torsemide with K+ supplement. 2D echo revealed EF 55-60% in 2016 with mod dilated RA; mild dilated RV; mod TR  Hx pulmonary arterial HTN - PA peak pressure 41 mm Hg in 2016  Glaucoma - stable on timolol gtts  Past Medical History:  Diagnosis Date  . Anemia   . Arthritis   . CHF (congestive heart failure) ([facility])   . Depression   . GERD (gastroesophageal reflux disease)   . Hyponatremia 01/2016  . Perforated chronic gastric ulcer ([facility]) 08/11/2014  . Pneumonia   . Pulmonary arterial hypertension 11/28/2014    Past Surgical History:  Procedure Laterality Date  . ABDOMINAL HYSTERECTOMY    . ABDOMINAL SURGERY    . BACK SURGERY  2000  . COLON SURGERY     x2-blockage  . COLONOSCOPY    . ERCP    . HERNIA REPAIR  2012   x2 with mesh  . JOINT REPLACEMENT Left    shoulder  . LAPAROTOMY N/A 08/04/2014   Procedure: EXPLORATORY LAPAROTOMY, REPAIR OF PERFORATED ULCER;  Surgeon: Excell Seltzer,  MD;  Location: [facility];  Service: General;  Laterality: N/A;  . LAPAROTOMY N/A 12/21/2015   Procedure: EXPLORATORY LAPAROTOMY/ REMOVAL OF MESH;  Surgeon: Coralie Keens, MD;  Location: [facility];  Service: General;  Laterality: N/A;  . ORIF WRIST FRACTURE  2012   left  . REVERSE SHOULDER ARTHROPLASTY Right 12/04/2013  . REVERSE SHOULDER ARTHROPLASTY  12/04/2013   Procedure: REVERSE SHOULDER ARTHROPLASTY;  Surgeon: Nita Sells, MD;  Location: [facility];  Service: Orthopedics;;   Right reverse total shoulder arthroplasty  . ROTATOR CUFF REPAIR     dr Tamera Punt  . SHOULDER ARTHROSCOPY  2012   lt and rt  . TENDON REPAIR Right 04/13/2014   Procedure: RIGHT HAND CENTRAL TENDON CENTRALIZATION OF LONG AND RING FINGERS;  Surgeon: Jolyn Nap, MD;  Location: [facility];  Service: Orthopedics;  Laterality: Right;    Patient Care Team: [name], MD as PCP - General (Internal Medicine)  Social History   Social History  . Marital status: Married    Spouse name: N/A  . Number of children: N/A  . Years of education: N/A   Occupational History  . Not on file.   Social History Main Topics  . Smoking status: Never Smoker  . Smokeless tobacco: Never Used  . Alcohol use 4.2 oz/week    7 Glasses of wine per week     Comment: 8 oz -12 oz of wine every   . Drug use: No  . Sexual activity: Not on file   Other Topics Concern  . Not on file   Social History Narrative  . No narrative on file     reports that she has never smoked. She has never used smokeless tobacco. She reports that she drinks about 4.2 oz of alcohol per week . She reports that she does not use drugs.  Family History  Problem Relation Age of Onset  . Stroke Mother   . Breast cancer Sister   . Ulcerative colitis Daughter   . Chronic fatigue Daughter   . Chronic fatigue Daughter    Family Status  Relation Status  . Mother Deceased  . Father Deceased at age 77   accident  . Sister Alive  . Brother Alive  . Sister Alive  . Sister Alive  . Brother Alive  . Son Alive  . Daughter Alive  . Daughter Alive  . Son Alive    Immunization History  Administered Date(s) Administered  . Influenza Split 02/03/2015    Allergies  Allergen Reactions  . Tape Other (See Comments)    SKIN IS VERY THIN; IT BRUISES AND TEARS EASILY!! -THX    Medications: Patient's Medications  New Prescriptions   No medications on file  Previous Medications   ACETAMINOPHEN (TYLENOL) 325 MG  TABLET    Take 325 mg by mouth every 6 (six) hours as needed for mild pain.    ALUM & MAG HYDROXIDE-SIMETH (MAALOX/MYLANTA) 200-200-20 MG/5ML SUSPENSION    Take 15 mLs by mouth as needed for indigestion or heartburn.   AMINO ACIDS-PROTEIN HYDROLYS (PRO-STAT) LIQD    Take 30 mLs by mouth 3 (three) times daily. TO PROMOTE WOUND HEALING   B COMPLEX VITAMINS TABLET    Take 1 tablet by mouth daily as needed (for supplement).    BISACODYL (DULCOLAX) 10 MG SUPPOSITORY    Place 1 suppository (10 mg total) rectally daily as needed for moderate constipation (May repeat times one).   BUDESONIDE (RHINOCORT AQUA) 32 MCG/ACT NASAL SPRAY  Place 1 spray into both nostrils daily as needed for rhinitis.   CHOLECALCIFEROL (VITAMIN D) 2000 UNITS TABLET    Take 2,000 Units by mouth daily.    DULOXETINE (CYMBALTA) 60 MG CAPSULE    Take 60 mg by mouth at bedtime.    ESOMEPRAZOLE MAGNESIUM (NEXIUM 24HR PO)    Take 22.3 mg by mouth daily.   GABAPENTIN (NEURONTIN) 600 MG TABLET    Take 0.5 tablets (300 mg total) by mouth 2 (two) times daily.   LACTOSE FREE NUTRITION (BOOST PLUS) LIQD    Take 237 mLs by mouth 2 (two) times daily between meals.   MAGNESIUM 400 MG TABS    Take 400 mg by mouth daily.   MAGNESIUM HYDROXIDE (MILK OF MAGNESIA) 400 MG/5ML SUSPENSION    Take 30 mLs by mouth every other day. FOR CONSTIPATION   METHADONE (DOLOPHINE) 5 MG TABLET    Take 1 tablet (5 mg total) by mouth every 12 (twelve) hours. FOR CHRONIC PAIN   METHOCARBAMOL (ROBAXIN) 500 MG TABLET    Take 500 mg by mouth 3 (three) times daily.   MULTIPLE VITAMINS-IRON (MULTIVITAMINS WITH IRON) TABS TABLET    Take 1 tablet by mouth daily with supper.   OMEGA-3 FATTY ACIDS (FISH OIL) 1000 MG CPDR    Take 1,000 mg by mouth daily.    OXYCODONE (OXY IR/ROXICODONE) 5 MG IMMEDIATE RELEASE TABLET    Take 1-2 tablets (5-10 mg total) by mouth every 8 (eight) hours as needed for pain.   POLYETHYLENE GLYCOL (MIRALAX / GLYCOLAX) PACKET    Take 17 g by mouth daily  as needed for mild constipation.   POTASSIUM CHLORIDE SA (K-DUR,KLOR-CON) 20 MEQ TABLET    Take 20 mEq by mouth daily as needed (Only takes with lasix).   SENNA (SENOKOT) 8.6 MG TABS TABLET    Take 2 tablets (17.2 mg total) by mouth at bedtime.   TIMOLOL (BETIMOL) 0.5 % OPHTHALMIC SOLUTION    Place 1 drop into both eyes 2 (two) times daily.    TORSEMIDE (DEMADEX) 20 MG TABLET    Take 40 mg by mouth daily.  Modified Medications   No medications on file  Discontinued Medications   AMOXICILLIN-CLAVULANATE (AUGMENTIN) 875-125 MG TABLET    Take 1 tablet by mouth every 12 (twelve) hours. 5 more days from 01/24/16   ASCORBIC ACID (VITAMIN C) 1000 MG TABLET    Take 1,000 mg by mouth daily.   DICLOFENAC SODIUM (VOLTAREN) 1 % GEL    Apply topically every three (3) days as needed (For Leg pain).    FUROSEMIDE (LASIX) 80 MG TABLET    Take 80 mg by mouth daily as needed for fluid.    LORAZEPAM (ATIVAN) 1 MG TABLET    Take 1 tablet (1 mg total) by mouth every 8 (eight) hours as needed for anxiety.   VITAMIN E 400 UNIT CAPSULE    Take 800 Units by mouth daily.     Review of Systems  Unable to perform ROS: Other (memory loss)    Vitals:   02/14/16 0921  BP: (!) 91/51  Pulse: 80  Resp: 20  Temp: 97.1 F (36.2 C)  TempSrc: Oral  SpO2: 91%  Weight: 169 lb 3.2 oz (76.7 kg)  Height: '5\' 3"'  (1.6 m)   Body mass index is 29.97 kg/m.  Physical Exam  Constitutional: She appears well-developed.  Frail appearing in NAD, sitting up in bed  HENT:  Mouth/Throat: Oropharynx is clear and moist. No oropharyngeal exudate.  Eyes: Pupils are equal, round, and reactive to light. No scleral icterus.  Neck: Neck supple. Carotid bruit is not present. No tracheal deviation present.  Cardiovascular: Normal rate, regular rhythm and intact distal pulses.  Exam reveals no gallop and no friction rub.   Murmur (1/6 SEM) heard. +1 pitting LE edema b/l. No calf TTP  Pulmonary/Chest: Effort normal and breath sounds normal.  No stridor. No respiratory distress. She has no wheezes. She has no rales.  Abdominal: Soft. Bowel sounds are normal. She exhibits no distension and no mass. There is no hepatomegaly. There is no tenderness. There is no rebound and no guarding.  Large abdominal wound dsg c/d/i. Wound vac intact  Musculoskeletal: She exhibits edema (large and small joints).  Lymphadenopathy:    She has no cervical adenopathy.  Neurological: She is alert.  Skin: Skin is warm and dry. No rash noted.  Large abdominal wound with wound vac intact  Psychiatric: She has a normal mood and affect. Her behavior is normal. Thought content normal.     Labs reviewed: Admission on 01/20/2016, Discharged on 01/24/2016  Component Date Value Ref Range Status  . Sodium 01/20/2016 131* 135 - 145 mmol/L Final  . Potassium 01/20/2016 4.7  3.5 - 5.1 mmol/L Final  . Chloride 01/20/2016 92* 101 - 111 mmol/L Final  . CO2 01/20/2016 32  22 - 32 mmol/L Final  . Glucose, Bld 01/20/2016 109* 65 - 99 mg/dL Final  . BUN 01/20/2016 14  6 - 20 mg/dL Final  . Creatinine, Ser 01/20/2016 0.62  0.44 - 1.00 mg/dL Final  . Calcium 01/20/2016 10.1  8.9 - 10.3 mg/dL Final  . Total Protein 01/20/2016 7.2  6.5 - 8.1 g/dL Final  . Albumin 01/20/2016 1.9* 3.5 - 5.0 g/dL Final  . AST 01/20/2016 25  15 - 41 U/L Final  . ALT 01/20/2016 14  14 - 54 U/L Final  . Alkaline Phosphatase 01/20/2016 130* 38 - 126 U/L Final  . Total Bilirubin 01/20/2016 0.3  0.3 - 1.2 mg/dL Final  . GFR calc non Af Amer 01/20/2016 >60  >60 mL/min Final  . GFR calc Af Amer 01/20/2016 >60  >60 mL/min Final   Comment: (NOTE) The eGFR has been calculated using the CKD EPI equation. This calculation has not been validated in all clinical situations. eGFR's persistently <60 mL/min signify possible Chronic Kidney Disease.   . Anion gap 01/20/2016 7  5 - 15 Final  . I-stat hCG, quantitative 01/20/2016 <5.0  <5 mIU/mL Final  . Comment 3 01/20/2016          Final   Comment:    GEST. AGE      CONC.  (mIU/mL)   <=1 WEEK        5 - 50     2 WEEKS       50 - 500     3 WEEKS       100 - 10,000     4 WEEKS     1,000 - 30,000        FEMALE AND NON-PREGNANT FEMALE:     LESS THAN 5 mIU/mL   . Lactic Acid, Venous 01/20/2016 1.06  0.5 - 1.9 mmol/L Final  . WBC 01/20/2016 13.8* 4.0 - 10.5 K/uL Final  . RBC 01/20/2016 3.43* 3.87 - 5.11 MIL/uL Final  . Hemoglobin 01/20/2016 10.2* 12.0 - 15.0 g/dL Final  . HCT 01/20/2016 32.6* 36.0 - 46.0 % Final  . MCV 01/20/2016 95.0  78.0 - 100.0  fL Final  . MCH 01/20/2016 29.7  26.0 - 34.0 pg Final  . MCHC 01/20/2016 31.3  30.0 - 36.0 g/dL Final  . RDW 01/20/2016 14.7  11.5 - 15.5 % Final  . Platelets 01/20/2016 447* 150 - 400 K/uL Final  . Neutrophils Relative % 01/20/2016 72  % Final  . Neutro Abs 01/20/2016 9.9* 1.7 - 7.7 K/uL Final  . Lymphocytes Relative 01/20/2016 16  % Final  . Lymphs Abs 01/20/2016 2.3  0.7 - 4.0 K/uL Final  . Monocytes Relative 01/20/2016 9  % Final  . Monocytes Absolute 01/20/2016 1.2* 0.1 - 1.0 K/uL Final  . Eosinophils Relative 01/20/2016 3  % Final  . Eosinophils Absolute 01/20/2016 0.4  0.0 - 0.7 K/uL Final  . Basophils Relative 01/20/2016 0  % Final  . Basophils Absolute 01/20/2016 0.0  0.0 - 0.1 K/uL Final  . Specimen Description 01/25/2016 BLOOD LEFT FOREARM   Final  . Special Requests 01/25/2016 BOTTLES DRAWN AEROBIC AND ANAEROBIC 5CC   Final  . Culture 01/25/2016 NO GROWTH 5 DAYS   Final  . Report Status 01/25/2016 01/25/2016 FINAL   Final  . Specimen Description 01/25/2016 BLOOD RIGHT ANTECUBITAL   Final  . Special Requests 01/25/2016 BOTTLES DRAWN AEROBIC AND ANAEROBIC 5CC   Final  . Culture 01/25/2016 NO GROWTH 5 DAYS   Final  . Report Status 01/25/2016 01/25/2016 FINAL   Final  . Specimen Description 01/22/2016 URINE, RANDOM   Final  . Special Requests 01/22/2016 NONE   Final  . Culture 01/22/2016 10,000 COLONIES/mL YEAST*  Final  . Report Status 01/22/2016 01/22/2016 FINAL   Final  .  Color, Urine 01/20/2016 YELLOW  YELLOW Final  . APPearance 01/20/2016 CLOUDY* CLEAR Final  . Specific Gravity, Urine 01/20/2016 1.015  1.005 - 1.030 Final  . pH 01/20/2016 7.0  5.0 - 8.0 Final  . Glucose, UA 01/20/2016 NEGATIVE  NEGATIVE mg/dL Final  . Hgb urine dipstick 01/20/2016 NEGATIVE  NEGATIVE Final  . Bilirubin Urine 01/20/2016 NEGATIVE  NEGATIVE Final  . Ketones, ur 01/20/2016 NEGATIVE  NEGATIVE mg/dL Final  . Protein, ur 01/20/2016 NEGATIVE  NEGATIVE mg/dL Final  . Nitrite 01/20/2016 NEGATIVE  NEGATIVE Final  . Leukocytes, UA 01/20/2016 TRACE* NEGATIVE Final  . Glucose-Capillary 01/20/2016 99  65 - 99 mg/dL Final  . Procalcitonin 01/21/2016 <0.10  ng/mL Final   Comment:        Interpretation: PCT (Procalcitonin) <= 0.5 ng/mL: Systemic infection (sepsis) is not likely. Local bacterial infection is possible. (NOTE)         ICU PCT Algorithm               Non ICU PCT Algorithm    ----------------------------     ------------------------------         PCT < 0.25 ng/mL                 PCT < 0.1 ng/mL     Stopping of antibiotics            Stopping of antibiotics       strongly encouraged.               strongly encouraged.    ----------------------------     ------------------------------       PCT level decrease by               PCT < 0.25 ng/mL       >= 80% from peak PCT       OR  PCT 0.25 - 0.5 ng/mL          Stopping of antibiotics                                             encouraged.     Stopping of antibiotics           encouraged.    ----------------------------     ------------------------------       PCT level decrease by              PCT >= 0.25 ng/mL       < 80% from peak PCT        AND PCT >= 0.5 ng/mL            Continuin                          g antibiotics                                              encouraged.       Continuing antibiotics            encouraged.    ----------------------------     ------------------------------     PCT level increase  compared          PCT > 0.5 ng/mL         with peak PCT AND          PCT >= 0.5 ng/mL             Escalation of antibiotics                                          strongly encouraged.      Escalation of antibiotics        strongly encouraged.   . Sodium 01/21/2016 133* 135 - 145 mmol/L Final  . Potassium 01/21/2016 4.0  3.5 - 5.1 mmol/L Final  . Chloride 01/21/2016 98* 101 - 111 mmol/L Final  . CO2 01/21/2016 28  22 - 32 mmol/L Final  . Glucose, Bld 01/21/2016 109* 65 - 99 mg/dL Final  . BUN 01/21/2016 11  6 - 20 mg/dL Final  . Creatinine, Ser 01/21/2016 0.55  0.44 - 1.00 mg/dL Final  . Calcium 01/21/2016 9.3  8.9 - 10.3 mg/dL Final  . GFR calc non Af Amer 01/21/2016 >60  >60 mL/min Final  . GFR calc Af Amer 01/21/2016 >60  >60 mL/min Final   Comment: (NOTE) The eGFR has been calculated using the CKD EPI equation. This calculation has not been validated in all clinical situations. eGFR's persistently <60 mL/min signify possible Chronic Kidney Disease.   . Anion gap 01/21/2016 7  5 - 15 Final  . WBC 01/21/2016 12.4* 4.0 - 10.5 K/uL Final  . RBC 01/21/2016 2.92* 3.87 - 5.11 MIL/uL Final  . Hemoglobin 01/21/2016 8.7* 12.0 - 15.0 g/dL Final  . HCT 01/21/2016 27.6* 36.0 - 46.0 % Final  . MCV 01/21/2016 94.5  78.0 - 100.0 fL Final  . MCH 01/21/2016 29.8  26.0 - 34.0 pg Final  . MCHC 01/21/2016 31.5  30.0 - 36.0 g/dL Final  . RDW 01/21/2016 14.6  11.5 - 15.5 % Final  . Platelets 01/21/2016 414* 150 - 400 K/uL Final  . Sodium, Ur 01/20/2016 80  mmol/L Final  . Osmolality, Ur 01/20/2016 413  300 - 900 mOsm/kg Final  . Osmolality 01/20/2016 278  275 - 295 mOsm/kg Final  . Squamous Epithelial / LPF 01/20/2016 6-30* NONE SEEN Final  . WBC, UA 01/20/2016 6-30  0 - 5 WBC/hpf Final  . RBC / HPF 01/20/2016 0-5  0 - 5 RBC/hpf Final  . Bacteria, UA 01/20/2016 FEW* NONE SEEN Final  . Urine-Other 01/20/2016 YEAST PRESENT   Final  . Procalcitonin 01/22/2016 0.15  ng/mL Final   Comment:          Interpretation: PCT (Procalcitonin) <= 0.5 ng/mL: Systemic infection (sepsis) is not likely. Local bacterial infection is possible. (NOTE)         ICU PCT Algorithm               Non ICU PCT Algorithm    ----------------------------     ------------------------------         PCT < 0.25 ng/mL                 PCT < 0.1 ng/mL     Stopping of antibiotics            Stopping of antibiotics       strongly encouraged.               strongly encouraged.    ----------------------------     ------------------------------       PCT level decrease by               PCT < 0.25 ng/mL       >= 80% from peak PCT       OR PCT 0.25 - 0.5 ng/mL          Stopping of antibiotics                                             encouraged.     Stopping of antibiotics           encouraged.    ----------------------------     ------------------------------       PCT level decrease by              PCT >= 0.25 ng/mL       < 80% from peak PCT        AND PCT >= 0.5 ng/mL            Continuin                          g antibiotics                                              encouraged.       Continuing antibiotics            encouraged.    ----------------------------     ------------------------------     PCT level increase compared          PCT > 0.5 ng/mL  with peak PCT AND          PCT >= 0.5 ng/mL             Escalation of antibiotics                                          strongly encouraged.      Escalation of antibiotics        strongly encouraged.   . WBC 01/22/2016 13.6* 4.0 - 10.5 K/uL Final  . RBC 01/22/2016 2.85* 3.87 - 5.11 MIL/uL Final  . Hemoglobin 01/22/2016 8.5* 12.0 - 15.0 g/dL Final  . HCT 01/22/2016 26.9* 36.0 - 46.0 % Final  . MCV 01/22/2016 94.4  78.0 - 100.0 fL Final  . MCH 01/22/2016 29.8  26.0 - 34.0 pg Final  . MCHC 01/22/2016 31.6  30.0 - 36.0 g/dL Final  . RDW 01/22/2016 14.5  11.5 - 15.5 % Final  . Platelets 01/22/2016 397  150 - 400 K/uL Final  . Sodium 01/22/2016  133* 135 - 145 mmol/L Final  . Potassium 01/22/2016 3.9  3.5 - 5.1 mmol/L Final  . Chloride 01/22/2016 100* 101 - 111 mmol/L Final  . CO2 01/22/2016 29  22 - 32 mmol/L Final  . Glucose, Bld 01/22/2016 105* 65 - 99 mg/dL Final  . BUN 01/22/2016 12  6 - 20 mg/dL Final  . Creatinine, Ser 01/22/2016 0.64  0.44 - 1.00 mg/dL Final  . Calcium 01/22/2016 9.5  8.9 - 10.3 mg/dL Final  . GFR calc non Af Amer 01/22/2016 >60  >60 mL/min Final  . GFR calc Af Amer 01/22/2016 >60  >60 mL/min Final   Comment: (NOTE) The eGFR has been calculated using the CKD EPI equation. This calculation has not been validated in all clinical situations. eGFR's persistently <60 mL/min signify possible Chronic Kidney Disease.   . Anion gap 01/22/2016 4* 5 - 15 Final  . WBC 01/23/2016 15.1* 4.0 - 10.5 K/uL Final  . RBC 01/23/2016 2.96* 3.87 - 5.11 MIL/uL Final  . Hemoglobin 01/23/2016 8.7* 12.0 - 15.0 g/dL Final  . HCT 01/23/2016 27.7* 36.0 - 46.0 % Final  . MCV 01/23/2016 93.6  78.0 - 100.0 fL Final  . MCH 01/23/2016 29.4  26.0 - 34.0 pg Final  . MCHC 01/23/2016 31.4  30.0 - 36.0 g/dL Final  . RDW 01/23/2016 14.6  11.5 - 15.5 % Final  . Platelets 01/23/2016 381  150 - 400 K/uL Final  . Sodium 01/23/2016 134* 135 - 145 mmol/L Final  . Potassium 01/23/2016 3.8  3.5 - 5.1 mmol/L Final  . Chloride 01/23/2016 100* 101 - 111 mmol/L Final  . CO2 01/23/2016 28  22 - 32 mmol/L Final  . Glucose, Bld 01/23/2016 92  65 - 99 mg/dL Final  . BUN 01/23/2016 8  6 - 20 mg/dL Final  . Creatinine, Ser 01/23/2016 0.62  0.44 - 1.00 mg/dL Final  . Calcium 01/23/2016 9.4  8.9 - 10.3 mg/dL Final  . GFR calc non Af Amer 01/23/2016 >60  >60 mL/min Final  . GFR calc Af Amer 01/23/2016 >60  >60 mL/min Final   Comment: (NOTE) The eGFR has been calculated using the CKD EPI equation. This calculation has not been validated in all clinical situations. eGFR's persistently <60 mL/min signify possible Chronic Kidney Disease.   . Anion gap  01/23/2016 6  5 - 15 Final  .  Procalcitonin 01/24/2016 <0.10  ng/mL Final   Comment:        Interpretation: PCT (Procalcitonin) <= 0.5 ng/mL: Systemic infection (sepsis) is not likely. Local bacterial infection is possible. (NOTE)         ICU PCT Algorithm               Non ICU PCT Algorithm    ----------------------------     ------------------------------         PCT < 0.25 ng/mL                 PCT < 0.1 ng/mL     Stopping of antibiotics            Stopping of antibiotics       strongly encouraged.               strongly encouraged.    ----------------------------     ------------------------------       PCT level decrease by               PCT < 0.25 ng/mL       >= 80% from peak PCT       OR PCT 0.25 - 0.5 ng/mL          Stopping of antibiotics                                             encouraged.     Stopping of antibiotics           encouraged.    ----------------------------     ------------------------------       PCT level decrease by              PCT >= 0.25 ng/mL       < 80% from peak PCT        AND PCT >= 0.5 ng/mL            Continuin                          g antibiotics                                              encouraged.       Continuing antibiotics            encouraged.    ----------------------------     ------------------------------     PCT level increase compared          PCT > 0.5 ng/mL         with peak PCT AND          PCT >= 0.5 ng/mL             Escalation of antibiotics                                          strongly encouraged.      Escalation of antibiotics        strongly encouraged.   Admission on 12/17/2015, Discharged on 01/06/2016  No results displayed because visit has over 200 results.  Ct Soft Tissue Neck W Contrast  Result Date: 01/21/2016 CLINICAL DATA:  82 y/o  F; enlarged right parotid gland with sepsis. EXAM: CT NECK WITH CONTRAST TECHNIQUE: Multidetector CT imaging of the neck was performed using the standard protocol  following the bolus administration of intravenous contrast. CONTRAST:  149m ISOVUE-300 IOPAMIDOL (ISOVUE-300) INJECTION 61% COMPARISON:  100 cc Isovue-300 FINDINGS: Pharynx and larynx: Normal. No mass or swelling. Salivary glands: There is asymmetric enhancement of the right parotid gland both superficial and deep lobes and ill-defined pattern and mild surrounding inflammatory changes of the subcutaneous fat compatible with parotiditis. Thyroid: Normal. Lymph nodes: None enlarged or abnormal density. Vascular: Mild calcific atherosclerosis of carotid bifurcations without significant stenosis. Brief retropharyngeal submucosal course of right internal carotid artery. Carotid and vertebral arteries of the neck as well as internal jugular veins are patent. Atherosclerosis of aortic arch with moderate calcification. Limited intracranial: Negative. Visualized orbits: Negative. Mastoids and visualized paranasal sinuses: Clear. Under pneumatized mastoid air cells probably represents sequelae of chronic otomastoiditis. Skeleton: No acute osseous abnormality is evident. Multilevel cervical degenerative changes with both disc osteophyte complexes and facet arthrosis. Grade 1 C3-4 retrolisthesis and C4-5 anterolisthesis on a degenerative basis. Multilevel mild to moderate bony foraminal narrowing. At least moderate canal stenosis at the C3-4 level. Upper chest: Small right-sided pleural effusion and dependent opacity which may represent atelectasis or pneumonia, partially visualized. Other: None. IMPRESSION: 1. Findings consistent with right parotiditis. No abscess identified. No significant lymphadenopathy. 2. Partially visualized small right pleural effusion and dependent right upper lobe opacity which may represent associated pneumonia or atelectasis. Electronically Signed   By: LKristine GarbeM.D.   On: 01/21/2016 02:28   Ct Abdomen Pelvis W Contrast  Result Date: 01/21/2016 CLINICAL DATA:  Acute onset of  fever. Failure to thrive and somnolence. Question of sepsis. Initial encounter. EXAM: CT ABDOMEN AND PELVIS WITH CONTRAST TECHNIQUE: Multidetector CT imaging of the abdomen and pelvis was performed using the standard protocol following bolus administration of intravenous contrast. CONTRAST:  1086mISOVUE-300 IOPAMIDOL (ISOVUE-300) INJECTION 61% COMPARISON:  CT of the abdomen and pelvis from 12/18/2015 FINDINGS: Lower chest: Small bilateral pleural effusions are noted. Bibasilar airspace opacities may reflect atelectasis or possibly infection. The visualized portions of the mediastinum are unremarkable in appearance. Mild calcification noted at the aortic valve. A large right-sided hiatal hernia is noted, containing most of the stomach. Hepatobiliary: Mild soft tissue inflammation about the gallbladder is nonspecific, given the soft tissue inflammation about the abdomen related to the patient's large postoperative defect. The liver is unremarkable in appearance. The common bile duct is dilated to 1.4 cm, of uncertain significance. Pancreas: The pancreas is within normal limits. Spleen: The spleen is unremarkable in appearance. Adrenals/Urinary Tract: The adrenal glands are unremarkable in appearance. The kidneys are within normal limits. There is no evidence of hydronephrosis. No renal or ureteral stones are identified. No perinephric stranding is seen. Stomach/Bowel: Postoperative change is noted at the right lower quadrant. Mild soft tissue inflammation is suggested about the transverse colon. The colon is largely filled with stool. Scattered diverticulosis is noted along the distal sigmoid colon. Apparent wall thickening at the rectum could reflect mild proctitis. Mild presacral stranding is noted. Visualized small bowel loops are grossly unremarkable. The stomach is otherwise unremarkable. Vascular/Lymphatic: Scattered calcification is seen along the abdominal aorta and its branches. The abdominal aorta is  otherwise grossly unremarkable. The inferior vena cava is grossly unremarkable. No retroperitoneal lymphadenopathy is seen. No pelvic sidewall lymphadenopathy is identified.  Reproductive: The bladder is mildly distended. Mild soft tissue inflammation is suggested about the bladder. The patient is status post hysterectomy. No suspicious adnexal masses are seen. Other: A very large anterior abdominal wall defect is noted, with packing material and underlying soft tissue thickening. Musculoskeletal: No acute osseous abnormalities are identified. Right convex thoracolumbar scoliosis is noted. The visualized musculature is unremarkable in appearance. IMPRESSION: 1. Apparent wall thickening at the rectum could reflect mild proctitis. 2. Small bilateral pleural effusions noted. Bibasilar airspace opacities may reflect atelectasis or possibly infection. 3. Large right-sided hiatal hernia, containing most of the stomach. 4. Mild soft tissue inflammation suggested about the bladder. Would correlate for any evidence of cystitis. 5. Nonspecific soft tissue inflammation suggested about the transverse colon. 6. Mild inflammation about the gallbladder is nonspecific, given the underlying diffuse soft tissue inflammation about the abdomen. 7. Very large anterior abdominal wall defect again noted, with packing material and underlying soft tissue thickening. 8. Scattered diverticulosis along the distal sigmoid colon, without evidence of diverticulitis. 9. Scattered aortic atherosclerosis noted. 10. Right convex thoracolumbar scoliosis noted. Electronically Signed   By: Garald Balding M.D.   On: 01/21/2016 02:35   Dg Chest Port 1 View  Result Date: 01/20/2016 CLINICAL DATA:  fever EXAM: PORTABLE CHEST 1 VIEW COMPARISON:  12/25/2015 FINDINGS: Right-sided PICC line has been removed. The heart is enlarged. Stable elevation of right hemidiaphragm. There is interstitial edema. There has been improvement aeration of the right lung base.  No new consolidation. IMPRESSION: 1. Cardiomegaly and mild interstitial edema. 2. Improved aeration. Electronically Signed   By: Nolon Nations M.D.   On: 01/20/2016 18:28     Assessment/Plan   ICD-9-CM ICD-10-CM   1. Physical deconditioning 799.3 R53.81   2. H/O abdominal surgery V45.89 Z98.890    exploratory laparotomy with removal of mesh and placement of Phasix mesh 12/21/15  3. Parotitis - right sided 527.2 K11.20    tx with IV vanco/zosyn--> po augmentin  4. Anemia of chronic disease 285.29 D63.8    s/p 2 units PRBCs  5. Protein-calorie malnutrition, severe ([place]) 262 E43   6. Chronic narcotic use 305.50 F11.90   7. Chronic pain syndrome 338.4 G89.4    due to arthritis  8. Hypotension, unspecified hypotension type 458.9 I95.9   9. Gastroesophageal reflux disease, esophagitis presence not specified 530.81 K21.9    Follow CBC and BMP  Cont current meds as ordered  PT/OT/ST as ordered  Aspiration precautions  Wound care as ordered. She has a wound vac and will need wound vac care as indicated  F/u with general sx as scheduled  GOAL: short term rehab with potential for long term care. Communicated with pt and nursing.  Will follow  Julann Mcgilvray S. Perlie Gold  [facility] [address] [place], [place] [address] [phone] Cell (Monday-Friday 8 AM - 5 PM) [phone] After 5 PM and follow prompts

## 2016-02-15 ENCOUNTER — Non-Acute Institutional Stay (SKILLED_NURSING_FACILITY): Payer: PPO | Admitting: Internal Medicine

## 2016-02-15 DIAGNOSIS — M549 Dorsalgia, unspecified: Secondary | ICD-10-CM | POA: Diagnosis not present

## 2016-02-15 DIAGNOSIS — K1120 Sialoadenitis, unspecified: Secondary | ICD-10-CM | POA: Insufficient documentation

## 2016-02-15 DIAGNOSIS — F1190 Opioid use, unspecified, uncomplicated: Secondary | ICD-10-CM | POA: Insufficient documentation

## 2016-02-15 DIAGNOSIS — D62 Acute posthemorrhagic anemia: Secondary | ICD-10-CM

## 2016-02-15 DIAGNOSIS — D649 Anemia, unspecified: Secondary | ICD-10-CM | POA: Diagnosis not present

## 2016-02-15 DIAGNOSIS — G894 Chronic pain syndrome: Secondary | ICD-10-CM

## 2016-02-15 DIAGNOSIS — K219 Gastro-esophageal reflux disease without esophagitis: Secondary | ICD-10-CM | POA: Insufficient documentation

## 2016-02-15 LAB — CBC AND DIFFERENTIAL
HEMATOCRIT: 31 % — AB (ref 36–46)
Hemoglobin: 9.9 g/dL — AB (ref 12.0–16.0)
PLATELETS: 379 10*3/uL (ref 150–399)
WBC: 9.5 10*3/mL

## 2016-02-15 LAB — BASIC METABOLIC PANEL
BUN: 9 mg/dL (ref 4–21)
Creatinine: 0.6 mg/dL (ref 0.5–1.1)
GLUCOSE: 110 mg/dL
Potassium: 4.3 mmol/L (ref 3.4–5.3)
Sodium: 135 mmol/L — AB (ref 137–147)

## 2016-02-15 NOTE — Progress Notes (Signed)
This is an acute visit.  Level care skilled.  Facility is [facility] complaint-acute visit follow-up complaints of back pain.  History of present illness.  Patient is an 82 year old female who is admitted to this facility from another nursing facility earlier this week.  She recently discharged from the hospital after 4 day stay for sepsis because of right parotitis--acute encephalopathy mild hyponatremia and severe malnutrition failure to thrive with chronic pain syndrome.  She underwent an exploratory laparotomy with removal of mesh and placement of Phasix mesh.  Her infection of the parotid gland was treated with IV vancomycin and Zosyn and switch to by mouth Augmentin.  He had an history of chronic pain narcotic medication doses were adjusted with reduction of the dose of methadone.  She had a wound VAC placed by general surgery prior to discharge.  In regards to her chronic pain she is on numerous medications including methadone 5 mg twice a day-OxyIR 5 or 10 mg every 8 hours when necessary.  She is also on Neurontin 3 mg twice a day and Robaxin 500 mg 3 times a day as well as Cymbalta 60 mg a day.  Nursing staff hasn't left a note that she was having complaints of back pain.  When I saw her this evening she actually was resting in bed comfortably and denied back pain but says her back does hurt some when she tries to get out of bed-.  She she doesn't really complain of any recent fall or trauma she is a poor historian secondary to dementia.  Past Medical History:  Diagnosis Date  . Anemia   . Arthritis   . CHF (congestive heart failure) ([place])   . Depression   . GERD (gastroesophageal reflux disease)   . Hyponatremia 01/2016  . Perforated chronic gastric ulcer ([place]) 08/11/2014  . Pneumonia   . Pulmonary arterial hypertension 11/28/2014         Past Surgical History:  Procedure Laterality Date  . ABDOMINAL HYSTERECTOMY    .  ABDOMINAL SURGERY    . BACK SURGERY  2000  . COLON SURGERY     x2-blockage  . COLONOSCOPY    . ERCP    . HERNIA REPAIR  2012   x2 with mesh  . JOINT REPLACEMENT Left    shoulder  . LAPAROTOMY N/A 08/04/2014   Procedure: EXPLORATORY LAPAROTOMY, REPAIR OF PERFORATED ULCER;  Surgeon: Excell Seltzer, MD;  Location: [facility];  Service: General;  Laterality: N/A;  . LAPAROTOMY N/A 12/21/2015   Procedure: EXPLORATORY LAPAROTOMY/ REMOVAL OF MESH;  Surgeon: Coralie Keens, MD;  Location: [place];  Service: General;  Laterality: N/A;  . ORIF WRIST FRACTURE  2012   left  . REVERSE SHOULDER ARTHROPLASTY Right 12/04/2013  . REVERSE SHOULDER ARTHROPLASTY  12/04/2013   Procedure: REVERSE SHOULDER ARTHROPLASTY;  Surgeon: Nita Sells, MD;  Location: [facility] OR;  Service: Orthopedics;;  Right reverse total shoulder arthroplasty  . ROTATOR CUFF REPAIR     dr Tamera Punt  . SHOULDER ARTHROSCOPY  2012   lt and rt  . TENDON REPAIR Right 04/13/2014   Procedure: RIGHT HAND CENTRAL TENDON CENTRALIZATION OF LONG AND RING FINGERS;  Surgeon: Jolyn Nap, MD;  Location: [place];  Service: Orthopedics;  Laterality: Right;    Patient Care Team: [name], MD as PCP - General (Internal Medicine)  Social History        Social History  . Marital status: Married    Spouse  name: N/A  . Number of children: N/A  . Years of education: N/A      Occupational History  . Not on file.         Social History Main Topics  . Smoking status: Never Smoker  . Smokeless tobacco: Never Used  . Alcohol use 4.2 oz/week    7 Glasses of wine per week     Comment: 8 oz -12 oz of wine every   . Drug use: No  . Sexual activity: Not on file       Other Topics Concern  . Not on file      Social History Narrative  . No narrative on file     reports that she has never smoked. She has never used smokeless tobacco. She reports that she drinks about  4.2 oz of alcohol per week . She reports that she does not use drugs.       Family History  Problem Relation Age of Onset  . Stroke Mother   . Breast cancer Sister   . Ulcerative colitis Daughter   . Chronic fatigue Daughter   . Chronic fatigue Daughter        Family Status  Relation Status  . Mother Deceased  . Father Deceased at age 17   accident  . Sister Alive  . Brother Alive  . Sister Alive  . Sister Alive  . Brother Alive  . Son Alive  . Daughter Alive  . Daughter Alive  . Son Alive        Immunization History  Administered Date(s) Administered  . Influenza Split 02/03/2015         Allergies  Allergen Reactions  . Tape Other (See Comments)    SKIN IS VERY THIN; IT BRUISES AND TEARS EASILY!! -THX    Medications:     Patient's Medications  New Prescriptions   No medications on file  Previous Medications   ACETAMINOPHEN (TYLENOL) 325 MG TABLET    Take 325 mg by mouth every 6 (six) hours as needed for mild pain.    ALUM & MAG HYDROXIDE-SIMETH (MAALOX/MYLANTA) 200-200-20 MG/5ML SUSPENSION    Take 15 mLs by mouth as needed for indigestion or heartburn.   AMINO ACIDS-PROTEIN HYDROLYS (PRO-STAT) LIQD    Take 30 mLs by mouth 3 (three) times daily. TO PROMOTE WOUND HEALING   B COMPLEX VITAMINS TABLET    Take 1 tablet by mouth daily as needed (for supplement).    BISACODYL (DULCOLAX) 10 MG SUPPOSITORY    Place 1 suppository (10 mg total) rectally daily as needed for moderate constipation (May repeat times one).   BUDESONIDE (RHINOCORT AQUA) 32 MCG/ACT NASAL SPRAY    Place 1 spray into both nostrils daily as needed for rhinitis.   CHOLECALCIFEROL (VITAMIN D) 2000 UNITS TABLET    Take 2,000 Units by mouth daily.    DULOXETINE (CYMBALTA) 60 MG CAPSULE    Take 60 mg by mouth at bedtime.    ESOMEPRAZOLE MAGNESIUM (NEXIUM 24HR PO)    Take 22.3 mg by mouth daily.   GABAPENTIN (NEURONTIN) 600 MG TABLET    Take 0.5 tablets (300 mg total) by mouth  2 (two) times daily.   LACTOSE FREE NUTRITION (BOOST PLUS) LIQD    Take 237 mLs by mouth 2 (two) times daily between meals.   MAGNESIUM 400 MG TABS    Take 400 mg by mouth daily.   MAGNESIUM HYDROXIDE (MILK OF MAGNESIA) 400 MG/5ML SUSPENSION    Take 30  mLs by mouth every other day. FOR CONSTIPATION   METHADONE (DOLOPHINE) 5 MG TABLET    Take 1 tablet (5 mg total) by mouth every 12 (twelve) hours. FOR CHRONIC PAIN   METHOCARBAMOL (ROBAXIN) 500 MG TABLET    Take 500 mg by mouth 3 (three) times daily.   MULTIPLE VITAMINS-IRON (MULTIVITAMINS WITH IRON) TABS TABLET    Take 1 tablet by mouth daily with supper.   OMEGA-3 FATTY ACIDS (FISH OIL) 1000 MG CPDR    Take 1,000 mg by mouth daily.    OXYCODONE (OXY IR/ROXICODONE) 5 MG IMMEDIATE RELEASE TABLET    Take 1-2 tablets (5-10 mg total) by mouth every 8 (eight) hours as needed for pain.   POLYETHYLENE GLYCOL (MIRALAX / GLYCOLAX) PACKET    Take 17 g by mouth daily as needed for mild constipation.   POTASSIUM CHLORIDE SA (K-DUR,KLOR-CON) 20 MEQ TABLET    Take 20 mEq by mouth daily as needed (Only takes with lasix).   SENNA (SENOKOT) 8.6 MG TABS TABLET    Take 2 tablets (17.2 mg total) by mouth at bedtime.   TIMOLOL (BETIMOL) 0.5 % OPHTHALMIC SOLUTION    Place 1 drop into both eyes 2 (two) times daily.    TORSEMIDE (DEMADEX) 20 MG TABLET    Take 40 mg by mouth daily.  Modified Medications   No medications on file  Discontinued Medications   AMOXICILLIN-CLAVULANATE (AUGMENTIN) 875-125 MG TABLET    Take 1 tablet by mouth every 12 (twelve) hours. 5 more days from 01/24/16   ASCORBIC ACID (VITAMIN C) 1000 MG TABLET    Take 1,000 mg by mouth daily.   DICLOFENAC SODIUM (VOLTAREN) 1 % GEL    Apply topically every three (3) days as needed (For Leg pain).    FUROSEMIDE (LASIX) 80 MG TABLET    Take 80 mg by mouth daily as needed for fluid.    LORAZEPAM (ATIVAN) 1 MG TABLET    Take 1 tablet (1 mg total) by mouth every 8 (eight) hours as needed for  anxiety.   VITAMIN E 400 UNIT CAPSULE    Take 800 Units by mouth daily.     Review of Systems  Unable to perform ROS: Other (memory loss)  Stated above however she is not really complaining of back pain currently although she says it hurts when out of bed does not complain of chest pain or shortness of breath   She is afebrile pulse is 80 respirations 18 blood pressure taken manually 104/64 Body mass index is 29.97 kg/m.  Physical Exam  Constitutional:   Frail appearing in NAD, lying in bed   HENT:  Mouth/Throat: Oropharynx is clear and moist. No oropharyngeal exudate.  Eyes: Pupils are equal, round, and reactive to light. No scleral icterus.     Cardiovascular: Normal rate, regular rhythm and intact distal pulses.  Exam reveals no gallop and no friction rub.   Murmur (1/6 SEM) heard. +1 pitting LE edema b/l. No calf TTP  Pulmonary/Chest: Effort normal and breath sounds normal. No stridor. No respiratory distress. She has no wheezes. She has no rales.  Abdominal: Soft. Bowel sounds are normal. She exhibits no distension and no mass. There is no hepatomegaly. There is no tenderness. There is no rebound and no guarding.  Large abdominal wound dsg c/d/i. Wound vac intact  Musculoskeletal: She exhibits edema (large and small joints).  Lymphadenopathy:    She has no cervical adenopathy.  Musculoskeletal-cannot really appreciate any deformity of her back and actually  she did not complain of tenderness to palpation of the back when  palpated.  Does move all extremities 4  Neurological: She is alert.  Skin: Skin is warm and dry. No rash noted.  Large abdominal wound with wound vac intact  Psychiatric: She has a normal mood and affect. Her behavior is normal. Thought content normal.   Labs.  02/15/2016.  Sodium 135 potassium 4.3 BUN 15.7 creatinine 0.56.  Calcium 10.7.  WBC 9.5 hemoglobin 9.9 platelets 379.  Assessment and plan.  #1 back pain-currently she appears  to be comfortable apparently this is more with movement this will have to be monitored but she is on extensive pain medication one would be concerned about adding to this-at this point will continue current medications which are extensive including methadone 5 mg twice a day-OxyIR 5 or 10 mg every 8 hours when necessary-Robaxin 5 mg 3 times a day-Neurontin 300 mg twice a day as well as Cymbalta 60 mg a day.  She will have to be encouraged however strongly to get out of bed and participate with therapy which may actually help with the pain.  Also will order an x-ray of her thoracic lumbar and sacral spine to rule out any acute process.  #2 mild hypercalcemia with a calcium of 10.7 on lab done today-will update this later in the week it appears per review of hospital records her calcium has run largely in the 9s up to low tens with some variation  #3 anemia-status post transfusion in the hospital this has improved up to 9.9 on lab done today   TA:[id number]

## 2016-02-16 ENCOUNTER — Other Ambulatory Visit: Payer: Self-pay | Admitting: *Deleted

## 2016-02-16 MED ORDER — OXYCODONE HCL 5 MG PO TABS: 5.0000 mg | ORAL_TABLET | Freq: Three times a day (TID) | ORAL | 0 refills | Status: DC | PRN

## 2016-02-16 MED ORDER — METHADONE HCL 5 MG PO TABS: ORAL_TABLET | ORAL | 0 refills | Status: DC

## 2016-02-16 NOTE — Telephone Encounter (Signed)
[facility] #[phone] Fax:[phone]  

## 2016-02-17 DIAGNOSIS — D649 Anemia, unspecified: Secondary | ICD-10-CM | POA: Diagnosis not present

## 2016-02-17 DIAGNOSIS — E871 Hypo-osmolality and hyponatremia: Secondary | ICD-10-CM | POA: Diagnosis not present

## 2016-02-18 DIAGNOSIS — A419 Sepsis, unspecified organism: Secondary | ICD-10-CM | POA: Diagnosis not present

## 2016-02-20 LAB — CBC AND DIFFERENTIAL
HCT: 29 % — AB (ref 36–46)
Hemoglobin: 9.3 g/dL — AB (ref 12.0–16.0)
Platelets: 358 10*3/uL (ref 150–399)
WBC: 14 10*3/mL

## 2016-02-21 ENCOUNTER — Encounter: Payer: Self-pay | Admitting: Internal Medicine

## 2016-02-21 ENCOUNTER — Non-Acute Institutional Stay (SKILLED_NURSING_FACILITY): Payer: PPO | Admitting: Internal Medicine

## 2016-02-21 DIAGNOSIS — E43 Unspecified severe protein-calorie malnutrition: Secondary | ICD-10-CM

## 2016-02-21 DIAGNOSIS — Z9889 Other specified postprocedural states: Secondary | ICD-10-CM

## 2016-02-21 DIAGNOSIS — I5032 Chronic diastolic (congestive) heart failure: Secondary | ICD-10-CM

## 2016-02-21 DIAGNOSIS — L03311 Cellulitis of abdominal wall: Secondary | ICD-10-CM | POA: Diagnosis not present

## 2016-02-21 NOTE — Progress Notes (Signed)
Patient ID: Monique Russo, female   DOB: March 02, 1931, 82 y.o.   MRN: [id number]    DATE:  02/21/2016  Location:    [facility] Room Number: [place] of Service: SNF (31)   Extended Emergency Contact Information Primary Emergency Contact: Yates,Bill Address: [address]          [place], [place] [address] [place] Phone: [phone] Mobile Phone: [phone] Relation: Spouse Secondary Emergency Contact: Faythe Ghee Address: [address]          [place], [place] [address] [place]nette Litter of [place] Phone: [phone] Mobile Phone: [phone] Relation: Sister  Advanced Directive information Does Patient Have a Medical Advance Directive?: No  Chief Complaint  Patient presents with  . Abnormal Lab    abnormal labs and wound vac issues    HPI:  82 yo female long term resident seen today for abnormal labs.CBC revealed WBC 14K with abs neutrophils 10.6K; Hgb 9.3; Cr 0.56. Nursing reports wound vac with serosanguinous d/c and puffiness around wound vac site. Pt reports increased abdominal swelling and occasional pain. no f/c. appetite reduced  GERD/ hx dysphagia - stable on nexium, maalox prn. She takes miralax for constipation  Anemia of chronic disease - current Hgb 9.3. She is s/p 2 units PRBCs in Oct 2017.  FTT/severe malnutrition - she received TPN during Oct 2017 hospital admission. Albumin 1.9. She gets nutritional supplements per facility protocol. She takes vitamins/minerals  Chronic pain syndrome due to generalized arthritis - pain stable on robaxin, roxicodone and methadone. Benefits of narcotic use outweighs risks. She also takes cymbalta and neurontin  Chronic dHF/hypotension - takes torsemide with K+ supplement. 2D echo revealed EF 55-60% in 2016 with mod dilated RA; mild dilated RV; mod TR  Hx pulmonary arterial HTN - PA peak pressure 41 mm Hg in 2016  Glaucoma - stable on timolol gtts  Past Medical History:  Diagnosis Date    . Anemia   . Arthritis   . CHF (congestive heart failure) ([place])   . Depression   . GERD (gastroesophageal reflux disease)   . Hyponatremia 01/2016  . Perforated chronic gastric ulcer ([place]) 08/11/2014  . Pneumonia   . Pulmonary arterial hypertension 11/28/2014    Past Surgical History:  Procedure Laterality Date  . ABDOMINAL HYSTERECTOMY    . ABDOMINAL SURGERY    . BACK SURGERY  2000  . COLON SURGERY     x2-blockage  . COLONOSCOPY    . ERCP    . HERNIA REPAIR  2012   x2 with mesh  . JOINT REPLACEMENT Left    shoulder  . LAPAROTOMY N/A 08/04/2014   Procedure: EXPLORATORY LAPAROTOMY, REPAIR OF PERFORATED ULCER;  Surgeon: Excell Seltzer, MD;  Location: [facility];  Service: General;  Laterality: N/A;  . LAPAROTOMY N/A 12/21/2015   Procedure: EXPLORATORY LAPAROTOMY/ REMOVAL OF MESH;  Surgeon: Coralie Keens, MD;  Location: [facility];  Service: General;  Laterality: N/A;  . ORIF WRIST FRACTURE  2012   left  . REVERSE SHOULDER ARTHROPLASTY Right 12/04/2013  . REVERSE SHOULDER ARTHROPLASTY  12/04/2013   Procedure: REVERSE SHOULDER ARTHROPLASTY;  Surgeon: Nita Sells, MD;  Location: [facility] OR;  Service: Orthopedics;;  Right reverse total shoulder arthroplasty  . ROTATOR CUFF REPAIR     dr Tamera Punt  . SHOULDER ARTHROSCOPY  2012   lt and rt  . TENDON REPAIR Right 04/13/2014   Procedure: RIGHT HAND CENTRAL TENDON CENTRALIZATION OF LONG AND RING FINGERS;  Surgeon: Rayvon Char  Grandville Silos, MD;  Location: [facility];  Service: Orthopedics;  Laterality: Right;    Patient Care Team: [name], MD as PCP - General (Internal Medicine)  Social History   Social History  . Marital status: Married    Spouse name: N/A  . Number of children: N/A  . Years of education: N/A   Occupational History  . Not on file.   Social History Main Topics  . Smoking status: Never Smoker  . Smokeless tobacco: Never Used  . Alcohol use 4.2 oz/week    7 Glasses of wine per week      Comment: 8 oz -12 oz of wine every   . Drug use: No  . Sexual activity: Not on file   Other Topics Concern  . Not on file   Social History Narrative  . No narrative on file     reports that she has never smoked. She has never used smokeless tobacco. She reports that she drinks about 4.2 oz of alcohol per week . She reports that she does not use drugs.  Family History  Problem Relation Age of Onset  . Stroke Mother   . Breast cancer Sister   . Ulcerative colitis Daughter   . Chronic fatigue Daughter   . Chronic fatigue Daughter    Family Status  Relation Status  . Mother Deceased  . Father Deceased at age 72   accident  . Sister Alive  . Brother Alive  . Sister Alive  . Sister Alive  . Brother Alive  . Son Alive  . Daughter Alive  . Daughter Alive  . Son Alive    Immunization History  Administered Date(s) Administered  . Influenza Split 02/03/2015    Allergies  Allergen Reactions  . Tape Other (See Comments)    SKIN IS VERY THIN; IT BRUISES AND TEARS EASILY!! -THX    Medications: Patient's Medications  New Prescriptions   No medications on file  Previous Medications   ACETAMINOPHEN (TYLENOL) 325 MG TABLET    Take 325 mg by mouth every 6 (six) hours as needed for mild pain.    ALUM & MAG HYDROXIDE-SIMETH (MAALOX/MYLANTA) 200-200-20 MG/5ML SUSPENSION    Take 15 mLs by mouth as needed for indigestion or heartburn.   AMINO ACIDS-PROTEIN HYDROLYS (PRO-STAT) LIQD    Take 30 mLs by mouth 3 (three) times daily. TO PROMOTE WOUND HEALING   B COMPLEX VITAMINS TABLET    Take 1 tablet by mouth daily as needed (for supplement).    BISACODYL (DULCOLAX) 10 MG SUPPOSITORY    Place 1 suppository (10 mg total) rectally daily as needed for moderate constipation (May repeat times one).   BUDESONIDE (RHINOCORT AQUA) 32 MCG/ACT NASAL SPRAY    Place 1 spray into both nostrils daily as needed for rhinitis.   CHOLECALCIFEROL (VITAMIN D) 2000 UNITS TABLET    Take 2,000 Units by mouth  daily.    DULOXETINE (CYMBALTA) 60 MG CAPSULE    Take 60 mg by mouth at bedtime.    ESOMEPRAZOLE MAGNESIUM (NEXIUM 24HR PO)    Take 22.3 mg by mouth daily.   GABAPENTIN (NEURONTIN) 600 MG TABLET    Take 0.5 tablets (300 mg total) by mouth 2 (two) times daily.   LACTOSE FREE NUTRITION (BOOST PLUS) LIQD    Take 237 mLs by mouth 2 (two) times daily between meals.   MAGNESIUM 400 MG TABS    Take 400 mg by mouth daily.   MAGNESIUM HYDROXIDE (MILK OF MAGNESIA) 400  MG/5ML SUSPENSION    Take 30 mLs by mouth every other day. FOR CONSTIPATION   METHADONE (DOLOPHINE) 5 MG TABLET    Take one tablet by mouth twice daily for pain   METHOCARBAMOL (ROBAXIN) 500 MG TABLET    Take 500 mg by mouth 3 (three) times daily.   MULTIPLE VITAMINS-MINERALS (DECUBI-VITE) CAPS    Take 1 capsule by mouth daily.   OMEGA-3 FATTY ACIDS (FISH OIL) 1000 MG CPDR    Take 1,000 mg by mouth daily.    OXYCODONE (OXY IR/ROXICODONE) 5 MG IMMEDIATE RELEASE TABLET    Take 1-2 tablets (5-10 mg total) by mouth every 8 (eight) hours as needed. For pain   POLYETHYLENE GLYCOL (MIRALAX / GLYCOLAX) PACKET    Take 17 g by mouth daily as needed for mild constipation.   POTASSIUM CHLORIDE SA (K-DUR,KLOR-CON) 20 MEQ TABLET    Take 20 mEq by mouth daily as needed (Only takes with lasix).   SENNA (SENOKOT) 8.6 MG TABS TABLET    Take 2 tablets (17.2 mg total) by mouth at bedtime.   TIMOLOL (BETIMOL) 0.5 % OPHTHALMIC SOLUTION    Place 1 drop into both eyes 2 (two) times daily.    TORSEMIDE (DEMADEX) 20 MG TABLET    Take 40 mg by mouth daily.  Modified Medications   No medications on file  Discontinued Medications   MULTIPLE VITAMINS-IRON (MULTIVITAMINS WITH IRON) TABS TABLET    Take 1 tablet by mouth daily with supper.    Review of Systems  Constitutional: Negative for chills and fever.  Gastrointestinal: Positive for abdominal pain.  Musculoskeletal: Positive for arthralgias and gait problem.  All other systems reviewed and are  negative.   Vitals:   02/21/16 1208  BP: 100/68  Pulse: 70  Resp: 18  Temp: 97.4 F (36.3 C)  TempSrc: Oral  SpO2: 98%  Weight: 169 lb 3.2 oz (76.7 kg)  Height: _0  (1.6 m)   Body mass index is 29.97 kg/m.  Physical Exam  Constitutional: She is oriented to person, place, and time. She appears well-developed and well-nourished.  Abdominal: Soft. Bowel sounds are normal. She exhibits distension. She exhibits no mass. There is no hepatomegaly. There is tenderness. There is no rebound and no guarding. No hernia.    Abdominal wound vac intact with peripheral swelling and min redness, TTP. Serosanguinous drainage in wound vac tube  Musculoskeletal: She exhibits edema.  Neurological: She is alert and oriented to person, place, and time.  Skin: Skin is warm and dry. No rash noted.  Psychiatric: She has a normal mood and affect. Her behavior is normal. Thought content normal.     Labs reviewed: Admission on 01/20/2016, Discharged on 01/24/2016  Component Date Value Ref Range Status  . Sodium 01/20/2016 131* 135 - 145 mmol/L Final  . Potassium 01/20/2016 4.7  3.5 - 5.1 mmol/L Final  . Chloride 01/20/2016 92* 101 - 111 mmol/L Final  . CO2 01/20/2016 32  22 - 32 mmol/L Final  . Glucose, Bld 01/20/2016 109* 65 - 99 mg/dL Final  . BUN 01/20/2016 14  6 - 20 mg/dL Final  . Creatinine, Ser 01/20/2016 0.62  0.44 - 1.00 mg/dL Final  . Calcium 01/20/2016 10.1  8.9 - 10.3 mg/dL Final  . Total Protein 01/20/2016 7.2  6.5 - 8.1 g/dL Final  . Albumin 01/20/2016 1.9* 3.5 - 5.0 g/dL Final  . AST 01/20/2016 25  15 - 41 U/L Final  . ALT 01/20/2016 14  14 - 54 U/L  Final  . Alkaline Phosphatase 01/20/2016 130* 38 - 126 U/L Final  . Total Bilirubin 01/20/2016 0.3  0.3 - 1.2 mg/dL Final  . GFR calc non Af Amer 01/20/2016 >60  >60 mL/min Final  . GFR calc Af Amer 01/20/2016 >60  >60 mL/min Final   Comment: (NOTE) The eGFR has been calculated using the CKD EPI equation. This calculation has not  been validated in all clinical situations. eGFR's persistently <60 mL/min signify possible Chronic Kidney Disease.   . Anion gap 01/20/2016 7  5 - 15 Final  . I-stat hCG, quantitative 01/20/2016 <5.0  <5 mIU/mL Final  . Comment 3 01/20/2016          Final   Comment:   GEST. AGE      CONC.  (mIU/mL)   <=1 WEEK        5 - 50     2 WEEKS       50 - 500     3 WEEKS       100 - 10,000     4 WEEKS     1,000 - 30,000        FEMALE AND NON-PREGNANT FEMALE:     LESS THAN 5 mIU/mL   . Lactic Acid, Venous 01/20/2016 1.06  0.5 - 1.9 mmol/L Final  . WBC 01/20/2016 13.8* 4.0 - 10.5 K/uL Final  . RBC 01/20/2016 3.43* 3.87 - 5.11 MIL/uL Final  . Hemoglobin 01/20/2016 10.2* 12.0 - 15.0 g/dL Final  . HCT 01/20/2016 32.6* 36.0 - 46.0 % Final  . MCV 01/20/2016 95.0  78.0 - 100.0 fL Final  . MCH 01/20/2016 29.7  26.0 - 34.0 pg Final  . MCHC 01/20/2016 31.3  30.0 - 36.0 g/dL Final  . RDW 01/20/2016 14.7  11.5 - 15.5 % Final  . Platelets 01/20/2016 447* 150 - 400 K/uL Final  . Neutrophils Relative % 01/20/2016 72  % Final  . Neutro Abs 01/20/2016 9.9* 1.7 - 7.7 K/uL Final  . Lymphocytes Relative 01/20/2016 16  % Final  . Lymphs Abs 01/20/2016 2.3  0.7 - 4.0 K/uL Final  . Monocytes Relative 01/20/2016 9  % Final  . Monocytes Absolute 01/20/2016 1.2* 0.1 - 1.0 K/uL Final  . Eosinophils Relative 01/20/2016 3  % Final  . Eosinophils Absolute 01/20/2016 0.4  0.0 - 0.7 K/uL Final  . Basophils Relative 01/20/2016 0  % Final  . Basophils Absolute 01/20/2016 0.0  0.0 - 0.1 K/uL Final  . Specimen Description 01/25/2016 BLOOD LEFT FOREARM   Final  . Special Requests 01/25/2016 BOTTLES DRAWN AEROBIC AND ANAEROBIC 5CC   Final  . Culture 01/25/2016 NO GROWTH 5 DAYS   Final  . Report Status 01/25/2016 01/25/2016 FINAL   Final  . Specimen Description 01/25/2016 BLOOD RIGHT ANTECUBITAL   Final  . Special Requests 01/25/2016 BOTTLES DRAWN AEROBIC AND ANAEROBIC 5CC   Final  . Culture 01/25/2016 NO GROWTH 5 DAYS    Final  . Report Status 01/25/2016 01/25/2016 FINAL   Final  . Specimen Description 01/22/2016 URINE, RANDOM   Final  . Special Requests 01/22/2016 NONE   Final  . Culture 01/22/2016 10,000 COLONIES/mL YEAST*  Final  . Report Status 01/22/2016 01/22/2016 FINAL   Final  . Color, Urine 01/20/2016 YELLOW  YELLOW Final  . APPearance 01/20/2016 CLOUDY* CLEAR Final  . Specific Gravity, Urine 01/20/2016 1.015  1.005 - 1.030 Final  . pH 01/20/2016 7.0  5.0 - 8.0 Final  . Glucose, UA 01/20/2016 NEGATIVE  NEGATIVE mg/dL Final  . Hgb urine dipstick 01/20/2016 NEGATIVE  NEGATIVE Final  . Bilirubin Urine 01/20/2016 NEGATIVE  NEGATIVE Final  . Ketones, ur 01/20/2016 NEGATIVE  NEGATIVE mg/dL Final  . Protein, ur 01/20/2016 NEGATIVE  NEGATIVE mg/dL Final  . Nitrite 01/20/2016 NEGATIVE  NEGATIVE Final  . Leukocytes, UA 01/20/2016 TRACE* NEGATIVE Final  . Glucose-Capillary 01/20/2016 99  65 - 99 mg/dL Final  . Procalcitonin 01/21/2016 <0.10  ng/mL Final   Comment:        Interpretation: PCT (Procalcitonin) <= 0.5 ng/mL: Systemic infection (sepsis) is not likely. Local bacterial infection is possible. (NOTE)         ICU PCT Algorithm               Non ICU PCT Algorithm    ----------------------------     ------------------------------         PCT < 0.25 ng/mL                 PCT < 0.1 ng/mL     Stopping of antibiotics            Stopping of antibiotics       strongly encouraged.               strongly encouraged.    ----------------------------     ------------------------------       PCT level decrease by               PCT < 0.25 ng/mL       >= 80% from peak PCT       OR PCT 0.25 - 0.5 ng/mL          Stopping of antibiotics                                             encouraged.     Stopping of antibiotics           encouraged.    ----------------------------     ------------------------------       PCT level decrease by              PCT >= 0.25 ng/mL       < 80% from peak PCT        AND PCT >=  0.5 ng/mL            Continuin                          g antibiotics                                              encouraged.       Continuing antibiotics            encouraged.    ----------------------------     ------------------------------     PCT level increase compared          PCT > 0.5 ng/mL         with peak PCT AND          PCT >= 0.5 ng/mL             Escalation of antibiotics  strongly encouraged.      Escalation of antibiotics        strongly encouraged.   . Sodium 01/21/2016 133* 135 - 145 mmol/L Final  . Potassium 01/21/2016 4.0  3.5 - 5.1 mmol/L Final  . Chloride 01/21/2016 98* 101 - 111 mmol/L Final  . CO2 01/21/2016 28  22 - 32 mmol/L Final  . Glucose, Bld 01/21/2016 109* 65 - 99 mg/dL Final  . BUN 01/21/2016 11  6 - 20 mg/dL Final  . Creatinine, Ser 01/21/2016 0.55  0.44 - 1.00 mg/dL Final  . Calcium 01/21/2016 9.3  8.9 - 10.3 mg/dL Final  . GFR calc non Af Amer 01/21/2016 >60  >60 mL/min Final  . GFR calc Af Amer 01/21/2016 >60  >60 mL/min Final   Comment: (NOTE) The eGFR has been calculated using the CKD EPI equation. This calculation has not been validated in all clinical situations. eGFR's persistently <60 mL/min signify possible Chronic Kidney Disease.   . Anion gap 01/21/2016 7  5 - 15 Final  . WBC 01/21/2016 12.4* 4.0 - 10.5 K/uL Final  . RBC 01/21/2016 2.92* 3.87 - 5.11 MIL/uL Final  . Hemoglobin 01/21/2016 8.7* 12.0 - 15.0 g/dL Final  . HCT 01/21/2016 27.6* 36.0 - 46.0 % Final  . MCV 01/21/2016 94.5  78.0 - 100.0 fL Final  . MCH 01/21/2016 29.8  26.0 - 34.0 pg Final  . MCHC 01/21/2016 31.5  30.0 - 36.0 g/dL Final  . RDW 01/21/2016 14.6  11.5 - 15.5 % Final  . Platelets 01/21/2016 414* 150 - 400 K/uL Final  . Sodium, Ur 01/20/2016 80  mmol/L Final  . Osmolality, Ur 01/20/2016 413  300 - 900 mOsm/kg Final  . Osmolality 01/20/2016 278  275 - 295 mOsm/kg Final  . Squamous Epithelial / LPF 01/20/2016 6-30* NONE  SEEN Final  . WBC, UA 01/20/2016 6-30  0 - 5 WBC/hpf Final  . RBC / HPF 01/20/2016 0-5  0 - 5 RBC/hpf Final  . Bacteria, UA 01/20/2016 FEW* NONE SEEN Final  . Urine-Other 01/20/2016 YEAST PRESENT   Final  . Procalcitonin 01/22/2016 0.15  ng/mL Final   Comment:        Interpretation: PCT (Procalcitonin) <= 0.5 ng/mL: Systemic infection (sepsis) is not likely. Local bacterial infection is possible. (NOTE)         ICU PCT Algorithm               Non ICU PCT Algorithm    ----------------------------     ------------------------------         PCT < 0.25 ng/mL                 PCT < 0.1 ng/mL     Stopping of antibiotics            Stopping of antibiotics       strongly encouraged.               strongly encouraged.    ----------------------------     ------------------------------       PCT level decrease by               PCT < 0.25 ng/mL       >= 80% from peak PCT       OR PCT 0.25 - 0.5 ng/mL          Stopping of antibiotics  encouraged.     Stopping of antibiotics           encouraged.    ----------------------------     ------------------------------       PCT level decrease by              PCT >= 0.25 ng/mL       < 80% from peak PCT        AND PCT >= 0.5 ng/mL            Continuin                          g antibiotics                                              encouraged.       Continuing antibiotics            encouraged.    ----------------------------     ------------------------------     PCT level increase compared          PCT > 0.5 ng/mL         with peak PCT AND          PCT >= 0.5 ng/mL             Escalation of antibiotics                                          strongly encouraged.      Escalation of antibiotics        strongly encouraged.   . WBC 01/22/2016 13.6* 4.0 - 10.5 K/uL Final  . RBC 01/22/2016 2.85* 3.87 - 5.11 MIL/uL Final  . Hemoglobin 01/22/2016 8.5* 12.0 - 15.0 g/dL Final  . HCT 01/22/2016 26.9* 36.0 -  46.0 % Final  . MCV 01/22/2016 94.4  78.0 - 100.0 fL Final  . MCH 01/22/2016 29.8  26.0 - 34.0 pg Final  . MCHC 01/22/2016 31.6  30.0 - 36.0 g/dL Final  . RDW 01/22/2016 14.5  11.5 - 15.5 % Final  . Platelets 01/22/2016 397  150 - 400 K/uL Final  . Sodium 01/22/2016 133* 135 - 145 mmol/L Final  . Potassium 01/22/2016 3.9  3.5 - 5.1 mmol/L Final  . Chloride 01/22/2016 100* 101 - 111 mmol/L Final  . CO2 01/22/2016 29  22 - 32 mmol/L Final  . Glucose, Bld 01/22/2016 105* 65 - 99 mg/dL Final  . BUN 01/22/2016 12  6 - 20 mg/dL Final  . Creatinine, Ser 01/22/2016 0.64  0.44 - 1.00 mg/dL Final  . Calcium 01/22/2016 9.5  8.9 - 10.3 mg/dL Final  . GFR calc non Af Amer 01/22/2016 >60  >60 mL/min Final  . GFR calc Af Amer 01/22/2016 >60  >60 mL/min Final   Comment: (NOTE) The eGFR has been calculated using the CKD EPI equation. This calculation has not been validated in all clinical situations. eGFR's persistently <60 mL/min signify possible Chronic Kidney Disease.   . Anion gap 01/22/2016 4* 5 - 15 Final  . WBC 01/23/2016 15.1* 4.0 - 10.5 K/uL Final  . RBC 01/23/2016 2.96* 3.87 - 5.11 MIL/uL Final  . Hemoglobin 01/23/2016 8.7* 12.0 - 15.0 g/dL Final  . HCT 01/23/2016  27.7* 36.0 - 46.0 % Final  . MCV 01/23/2016 93.6  78.0 - 100.0 fL Final  . MCH 01/23/2016 29.4  26.0 - 34.0 pg Final  . MCHC 01/23/2016 31.4  30.0 - 36.0 g/dL Final  . RDW 01/23/2016 14.6  11.5 - 15.5 % Final  . Platelets 01/23/2016 381  150 - 400 K/uL Final  . Sodium 01/23/2016 134* 135 - 145 mmol/L Final  . Potassium 01/23/2016 3.8  3.5 - 5.1 mmol/L Final  . Chloride 01/23/2016 100* 101 - 111 mmol/L Final  . CO2 01/23/2016 28  22 - 32 mmol/L Final  . Glucose, Bld 01/23/2016 92  65 - 99 mg/dL Final  . BUN 01/23/2016 8  6 - 20 mg/dL Final  . Creatinine, Ser 01/23/2016 0.62  0.44 - 1.00 mg/dL Final  . Calcium 01/23/2016 9.4  8.9 - 10.3 mg/dL Final  . GFR calc non Af Amer 01/23/2016 >60  >60 mL/min Final  . GFR calc Af Amer  01/23/2016 >60  >60 mL/min Final   Comment: (NOTE) The eGFR has been calculated using the CKD EPI equation. This calculation has not been validated in all clinical situations. eGFR's persistently <60 mL/min signify possible Chronic Kidney Disease.   . Anion gap 01/23/2016 6  5 - 15 Final  . Procalcitonin 01/24/2016 <0.10  ng/mL Final   Comment:        Interpretation: PCT (Procalcitonin) <= 0.5 ng/mL: Systemic infection (sepsis) is not likely. Local bacterial infection is possible. (NOTE)         ICU PCT Algorithm               Non ICU PCT Algorithm    ----------------------------     ------------------------------         PCT < 0.25 ng/mL                 PCT < 0.1 ng/mL     Stopping of antibiotics            Stopping of antibiotics       strongly encouraged.               strongly encouraged.    ----------------------------     ------------------------------       PCT level decrease by               PCT < 0.25 ng/mL       >= 80% from peak PCT       OR PCT 0.25 - 0.5 ng/mL          Stopping of antibiotics                                             encouraged.     Stopping of antibiotics           encouraged.    ----------------------------     ------------------------------       PCT level decrease by              PCT >= 0.25 ng/mL       < 80% from peak PCT        AND PCT >= 0.5 ng/mL            Continuin  g antibiotics                                              encouraged.       Continuing antibiotics            encouraged.    ----------------------------     ------------------------------     PCT level increase compared          PCT > 0.5 ng/mL         with peak PCT AND          PCT >= 0.5 ng/mL             Escalation of antibiotics                                          strongly encouraged.      Escalation of antibiotics        strongly encouraged.   Admission on 12/17/2015, Discharged on 01/06/2016  No results displayed because visit has  over 200 results.      No results found.   Assessment/Plan   ICD-9-CM ICD-10-CM   1. Cellulitis of abdominal wall 682.2 L03.311   2. H/O abdominal surgery V45.89 Z98.890   3. Protein-calorie malnutrition, severe ([place]) 262 E43   4. Chronic diastolic heart failure (HCC) 428.32 I50.32      Augmentin 875 po BID x 7 days with floraster po BID x 3 weeks  F/u with surgery as scheduled  Cont wound vac care as directed  Change torsemide 14m daily with K supplement  Cont other meds as ordered  Wound care as ordered  Will follow  Jamarie Mussa S. CPerlie Gold PWomen'S And Children'[facility] Hospitaland Adult [facility] [address] [phone] (Monday-Friday 8 AM - 5 PM) [phone] 5 PM and follow prompts

## 2016-02-24 DIAGNOSIS — L98493 Non-pressure chronic ulcer of skin of other sites with necrosis of muscle: Secondary | ICD-10-CM | POA: Diagnosis not present

## 2016-02-24 DIAGNOSIS — L259 Unspecified contact dermatitis, unspecified cause: Secondary | ICD-10-CM | POA: Diagnosis not present

## 2016-03-01 DIAGNOSIS — L98493 Non-pressure chronic ulcer of skin of other sites with necrosis of muscle: Secondary | ICD-10-CM | POA: Diagnosis not present

## 2016-03-01 DIAGNOSIS — L259 Unspecified contact dermatitis, unspecified cause: Secondary | ICD-10-CM | POA: Diagnosis not present

## 2016-03-09 DIAGNOSIS — L98493 Non-pressure chronic ulcer of skin of other sites with necrosis of muscle: Secondary | ICD-10-CM | POA: Diagnosis not present

## 2016-03-16 DIAGNOSIS — L98493 Non-pressure chronic ulcer of skin of other sites with necrosis of muscle: Secondary | ICD-10-CM | POA: Diagnosis not present

## 2016-03-17 ENCOUNTER — Encounter: Payer: Self-pay | Admitting: Adult Health

## 2016-03-17 ENCOUNTER — Non-Acute Institutional Stay (SKILLED_NURSING_FACILITY): Payer: PPO | Admitting: Adult Health

## 2016-03-17 DIAGNOSIS — G894 Chronic pain syndrome: Secondary | ICD-10-CM | POA: Diagnosis not present

## 2016-03-17 DIAGNOSIS — F1190 Opioid use, unspecified, uncomplicated: Secondary | ICD-10-CM

## 2016-03-17 DIAGNOSIS — I2721 Secondary pulmonary arterial hypertension: Secondary | ICD-10-CM | POA: Diagnosis not present

## 2016-03-17 DIAGNOSIS — Z79899 Other long term (current) drug therapy: Secondary | ICD-10-CM | POA: Diagnosis not present

## 2016-03-17 DIAGNOSIS — T402X5A Adverse effect of other opioids, initial encounter: Secondary | ICD-10-CM

## 2016-03-17 DIAGNOSIS — G8929 Other chronic pain: Secondary | ICD-10-CM

## 2016-03-17 DIAGNOSIS — D649 Anemia, unspecified: Secondary | ICD-10-CM | POA: Diagnosis not present

## 2016-03-17 DIAGNOSIS — K219 Gastro-esophageal reflux disease without esophagitis: Secondary | ICD-10-CM

## 2016-03-17 DIAGNOSIS — E43 Unspecified severe protein-calorie malnutrition: Secondary | ICD-10-CM | POA: Diagnosis not present

## 2016-03-17 DIAGNOSIS — M545 Low back pain: Secondary | ICD-10-CM | POA: Diagnosis not present

## 2016-03-17 DIAGNOSIS — K5903 Drug induced constipation: Secondary | ICD-10-CM

## 2016-03-17 DIAGNOSIS — D62 Acute posthemorrhagic anemia: Secondary | ICD-10-CM

## 2016-03-17 NOTE — Progress Notes (Signed)
Location:   [facility] [facility] Number: 120 B Place of Service:  SNF (31)   CODE STATUS: Full Code  Allergies  Allergen Reactions  . Tape Other (See Comments)    SKIN IS VERY THIN; IT BRUISES AND TEARS EASILY!! -THX    Chief Complaint  Patient presents with  . Medical Management of Chronic Issues    HPI:  She is a resident of this facility being seen for the management of her chronic illnesses. Overall her status is without change. She is not voicing any complaints today; she is a poor historian. There are no nursing concerns at this time    Past Medical History:  Diagnosis Date  . Anemia   . Arthritis   . CHF (congestive heart failure) ([place])   . Depression   . GERD (gastroesophageal reflux disease)   . Hyponatremia 01/2016  . Perforated chronic gastric ulcer ([place]) 08/11/2014  . Pneumonia   . Pulmonary arterial hypertension 11/28/2014    Past Surgical History:  Procedure Laterality Date  . ABDOMINAL HYSTERECTOMY    . ABDOMINAL SURGERY    . BACK SURGERY  2000  . COLON SURGERY     x2-blockage  . COLONOSCOPY    . ERCP    . HERNIA REPAIR  2012   x2 with mesh  . JOINT REPLACEMENT Left    shoulder  . LAPAROTOMY N/A 08/04/2014   Procedure: EXPLORATORY LAPAROTOMY, REPAIR OF PERFORATED ULCER;  Surgeon: Excell Seltzer, MD;  Location: [facility];  Service: General;  Laterality: N/A;  . LAPAROTOMY N/A 12/21/2015   Procedure: EXPLORATORY LAPAROTOMY/ REMOVAL OF MESH;  Surgeon: Coralie Keens, MD;  Location: [place];  Service: General;  Laterality: N/A;  . ORIF WRIST FRACTURE  2012   left  . REVERSE SHOULDER ARTHROPLASTY Right 12/04/2013  . REVERSE SHOULDER ARTHROPLASTY  12/04/2013   Procedure: REVERSE SHOULDER ARTHROPLASTY;  Surgeon: Nita Sells, MD;  Location: [facility] OR;  Service: Orthopedics;;  Right reverse total shoulder arthroplasty  . ROTATOR CUFF REPAIR     dr Tamera Punt  . SHOULDER ARTHROSCOPY  2012   lt and rt  . TENDON REPAIR Right 04/13/2014   Procedure: RIGHT HAND CENTRAL TENDON CENTRALIZATION OF LONG AND RING FINGERS;  Surgeon: Jolyn Nap, MD;  Location: [place];  Service: Orthopedics;  Laterality: Right;    Social History   Social History  . Marital status: Married    Spouse name: N/A  . Number of children: N/A  . Years of education: N/A   Occupational History  . Not on file.   Social History Main Topics  . Smoking status: Never Smoker  . Smokeless tobacco: Never Used  . Alcohol use 4.2 oz/week    7 Glasses of wine per week     Comment: 8 oz -12 oz of wine every   . Drug use: No  . Sexual activity: Not on file   Other Topics Concern  . Not on file   Social History Narrative  . No narrative on file   Family History  Problem Relation Age of Onset  . Stroke Mother   . Breast cancer Sister   . Ulcerative colitis Daughter   . Chronic fatigue Daughter   . Chronic fatigue Daughter       VITAL SIGNS BP 100/68   Pulse 80   Temp 97.7 F (36.5 C)   Resp 18   Ht '5\' 3"'  (1.6 m)   Wt 169 lb 3.2 oz (76.7 kg)  SpO2 95%   BMI 29.97 kg/m   Patient's Medications  New Prescriptions   No medications on file  Previous Medications   ACETAMINOPHEN (TYLENOL) 325 MG TABLET    Take 325 mg by mouth every 6 (six) hours as needed for mild pain.    ALUM & MAG HYDROXIDE-SIMETH (MAALOX/MYLANTA) 200-200-20 MG/5ML SUSPENSION    Take 15 mLs by mouth as needed for indigestion or heartburn.   AMINO ACIDS-PROTEIN HYDROLYS (PRO-STAT) LIQD    Take 30 mLs by mouth 3 (three) times daily. TO PROMOTE WOUND HEALING   B COMPLEX VITAMINS TABLET    Take 1 tablet by mouth daily as needed (for supplement).    BISACODYL (DULCOLAX) 10 MG SUPPOSITORY    Place 1 suppository (10 mg total) rectally daily as needed for moderate constipation (May repeat times one).   BUDESONIDE (RHINOCORT AQUA) 32 MCG/ACT NASAL SPRAY    Place 1 spray into both nostrils daily as needed for rhinitis.   CHOLECALCIFEROL (VITAMIN D) 2000 UNITS  TABLET    Take 2,000 Units by mouth daily.    DICLOFENAC SODIUM (VOLTAREN) 1 % GEL    Apply 1 g topically 2 (two) times daily. Apply to bilateral knees for pain   DULOXETINE (CYMBALTA) 60 MG CAPSULE    Take 60 mg by mouth at bedtime.    ESOMEPRAZOLE MAGNESIUM (NEXIUM 24HR PO)    Take 22.3 mg by mouth daily.   GABAPENTIN (NEURONTIN) 600 MG TABLET    Take 0.5 tablets (300 mg total) by mouth 2 (two) times daily.   LACTOSE FREE NUTRITION (BOOST PLUS) LIQD    Take 237 mLs by mouth 2 (two) times daily between meals.   MAGNESIUM 400 MG TABS    Take 400 mg by mouth daily.   MAGNESIUM HYDROXIDE (MILK OF MAGNESIA) 400 MG/5ML SUSPENSION    Take 30 mLs by mouth every other day. FOR CONSTIPATION   METHADONE (DOLOPHINE) 5 MG TABLET    Take one tablet by mouth twice daily for pain   METHOCARBAMOL (ROBAXIN) 500 MG TABLET    Take 500 mg by mouth 3 (three) times daily.   MULTIPLE VITAMINS-MINERALS (DECUBI-VITE) CAPS    Take 1 capsule by mouth daily.   OMEGA-3 FATTY ACIDS (FISH OIL) 1000 MG CPDR    Take 1,000 mg by mouth daily.    OXYCODONE (OXY IR/ROXICODONE) 5 MG IMMEDIATE RELEASE TABLET    Take 1-2 tablets (5-10 mg total) by mouth every 8 (eight) hours as needed. For pain   POLYETHYLENE GLYCOL (MIRALAX / GLYCOLAX) PACKET    Take 17 g by mouth daily as needed for mild constipation.   POTASSIUM CHLORIDE SA (K-DUR,KLOR-CON) 20 MEQ TABLET    Take 20 mEq by mouth daily as needed (Only takes with lasix).   SENNA (SENOKOT) 8.6 MG TABS TABLET    Take 2 tablets (17.2 mg total) by mouth at bedtime.   TIMOLOL (BETIMOL) 0.5 % OPHTHALMIC SOLUTION    Place 1 drop into both eyes 2 (two) times daily.    TORSEMIDE (DEMADEX) 20 MG TABLET    Take 40 mg by mouth daily.  Modified Medications   No medications on file  Discontinued Medications   No medications on file     SIGNIFICANT DIAGNOSTIC EXAMS  01-20-16; chest x-ray: 1. Cardiomegaly and mild interstitial edema. 2. Improved aeration.  01-21-16: ct of abdomen and pelvis:  1. Apparent wall thickening at the rectum could reflect mild proctitis. 2. Small bilateral pleural effusions noted. Bibasilar airspace opacities may reflect  atelectasis or possibly infection. 3. Large right-sided hiatal hernia, containing most of the stomach. 4. Mild soft tissue inflammation suggested about the bladder. Would correlate for any evidence of cystitis. 5. Nonspecific soft tissue inflammation suggested about the transverse colon. 6. Mild inflammation about the gallbladder is nonspecific, given the underlying diffuse soft tissue inflammation about the abdomen. 7. Very large anterior abdominal wall defect again noted, with packing material and underlying soft tissue thickening. 8. Scattered diverticulosis along the distal sigmoid colon, without evidence of diverticulitis. 9. Scattered aortic atherosclerosis noted. 10. Right convex thoracolumbar scoliosis noted.   01-21-16 ct of neck: 1. Findings consistent with right parotiditis. No abscess identified. No significant lymphadenopathy. 2. Partially visualized small right pleural effusion and dependent right upper lobe opacity which may represent associated pneumonia or atelectasis.  LABS REVIEWED:   12-27-15: pre-albumin 6.9 01-20-16; wbc 13.8; hgb 10.2; hct 32.6; mcv 95.0; plt 447; gluocose 109; bun 14; creat 0.62; k+ 4.7; na++ 131; alk phos 130; albumin 1.9 01-23-16: wbc 15.1; hgb 8.7; hct 27.2; mcv 93.6; plt 381; glucose 92; bun 8; creat 0.62; k+ 3.8; na++ 134 02-15-16: wbc 9.5; hgb 9.9; hct 31; plt 379; glucose 110; bun 9; creat 0.6; k+ 4.3; na++ 135      Review of Systems  Unable to perform ROS: Other  poor historian    Physical Exam  Constitutional: No distress.  Frail   Eyes: Conjunctivae are normal.  Neck: Neck supple. No JVD present. No thyromegaly present.  Cardiovascular: Normal rate, regular rhythm and intact distal pulses.   Murmur heard. Respiratory: Effort normal and breath sounds normal. No respiratory distress.  She has no wheezes.  GI: Soft. Bowel sounds are normal. She exhibits no distension. There is no tenderness.  Abdominal wound: wound vac in place  Musculoskeletal: She exhibits no edema.  Able to move all extremities   Lymphadenopathy:    She has no cervical adenopathy.  Neurological: She is alert.  Skin: Skin is warm and dry. She is not diaphoretic.  Psychiatric: She has a normal mood and affect.    ASSESSMENT/ PLAN:  1. Gerd: will continue nexium 22.3 mg daily   2. Anemia: hgb 9.9; will monitor   3. Chronic pain syndrome: has chronic bilateral low back pain: is on chronic narcotic therapy: will continue methadone 5 mg twice daily robaxin 500 mg three times daily neurontin 300 mg twice daily  oxycodone 5 or 10 mg every 8 hours as needed  4. Osteoarthritis of knees: will continue voltaren gel 1 gm to knees twice daily   5.  Depression: will continue cymbalta 60 mg daily   7. Severe protein calorie malnutrition: pre-albumin 6.9; will continue prostat 30 cc three times daily and supplements per facility protocol  8. Lower extremity edema: will continue demadex 40 mg daily with k+ 20 meq daily  9. Constipation: will continue senna 2 tabs nightly and miralax daily as needed  10. Glaucoma: will continue timolol to both eyes twice daily   11. Status post laparotomy with removal of mesh: has large abdominal wound: will continue wound vac and will monitor her status   12. Pulmonary hypertension: PA peak pressure 41 mm Hg in 2016      Time spent with patient 45   minutes >50% time spent counseling; reviewing medical record; tests; labs; and developing future plan of care    MD is aware of resident's narcotic use and is in agreement with current plan of care. We will attempt to wean resident as  apropriate   Ok Edwards NP [facility]  Contact [phone] Monday through Friday 8am- 5pm  After hours call [phone]

## 2016-03-20 DIAGNOSIS — A419 Sepsis, unspecified organism: Secondary | ICD-10-CM | POA: Diagnosis not present

## 2016-03-22 DIAGNOSIS — L98493 Non-pressure chronic ulcer of skin of other sites with necrosis of muscle: Secondary | ICD-10-CM | POA: Diagnosis not present

## 2016-03-29 ENCOUNTER — Non-Acute Institutional Stay (SKILLED_NURSING_FACILITY): Payer: PPO | Admitting: Internal Medicine

## 2016-03-29 ENCOUNTER — Encounter: Payer: Self-pay | Admitting: Internal Medicine

## 2016-03-29 DIAGNOSIS — L089 Local infection of the skin and subcutaneous tissue, unspecified: Secondary | ICD-10-CM

## 2016-03-29 DIAGNOSIS — G894 Chronic pain syndrome: Secondary | ICD-10-CM | POA: Diagnosis not present

## 2016-03-29 DIAGNOSIS — S31109D Unspecified open wound of abdominal wall, unspecified quadrant without penetration into peritoneal cavity, subsequent encounter: Secondary | ICD-10-CM

## 2016-03-29 NOTE — Progress Notes (Signed)
This is an acute visit.  Level of care skilled.  Facility is Psychologist, sport and exercise complaint-acute visit secondary to pain management.     History of present illness.  Patient is an 83 year old female admitted to facility in late November 2017 from another [place].  She had been previously hospitalized for sepsis because of right parotitis-with hyponatremia and malnutrition failure to thrive and chronic pain.  Prior to that admission she underwent an exploratory laparotomy with removal of mesh and placement of Phasix mesh back in October 2013. She was also treated for encephalopathy She also had her narcotics adjusted with reduction in her dose of methadone.  She had a wound VAC placed to the abdominal wound.  She continues with the wound VAC.  Apparently there was some drainage that grew out staph aureus and she is completing a 10 day course of doxycycline for this she is not complaining of any fever chills or abdominal discomfort-  Regards chronic pain she is on numerous agents including methadone 5 mg twice a day-Robaxin 3 times a day-Neurontin 300 mg twice a day she is also on Cymbalta 60 mg a day as well as oxycodone 5 or 10 mg every 8 hours as needed for pain.  Her main complaint today is left lateral leg pain she says this is been chronic and persistent especially since she's been in the facility area  I did speak with her therapist as well as states she has somewhat chronic diffuse pain including back and leg pain that is persistent since she's arrived here.  However she apparently is doing well with therapy ambulating which is encouraging.  Currently her main complaint is left lateral leg pain  Past Medical History:  Diagnosis Date  . Anemia   . Arthritis   . CHF (congestive heart failure) ([place])   . Depression   . GERD (gastroesophageal reflux disease)   . Hyponatremia 01/2016  . Perforated chronic gastric ulcer ([place]) 08/11/2014  .  Pneumonia   . Pulmonary arterial hypertension 11/28/2014         Past Surgical History:  Procedure Laterality Date  . ABDOMINAL HYSTERECTOMY    . ABDOMINAL SURGERY    . BACK SURGERY  2000  . COLON SURGERY     x2-blockage  . COLONOSCOPY    . ERCP    . HERNIA REPAIR  2012   x2 with mesh  . JOINT REPLACEMENT Left    shoulder  . LAPAROTOMY N/A 08/04/2014   Procedure: EXPLORATORY LAPAROTOMY, REPAIR OF PERFORATED ULCER;  Surgeon: Excell Seltzer, MD;  Location: [facility];  Service: General;  Laterality: N/A;  . LAPAROTOMY N/A 12/21/2015   Procedure: EXPLORATORY LAPAROTOMY/ REMOVAL OF MESH;  Surgeon: Coralie Keens, MD;  Location: [place];  Service: General;  Laterality: N/A;  . ORIF WRIST FRACTURE  2012   left  . REVERSE SHOULDER ARTHROPLASTY Right 12/04/2013  . REVERSE SHOULDER ARTHROPLASTY  12/04/2013   Procedure: REVERSE SHOULDER ARTHROPLASTY;  Surgeon: Nita Sells, MD;  Location: [facility] OR;  Service: Orthopedics;;  Right reverse total shoulder arthroplasty  . ROTATOR CUFF REPAIR     dr Tamera Punt  . SHOULDER ARTHROSCOPY  2012   lt and rt  . TENDON REPAIR Right 04/13/2014   Procedure: RIGHT HAND CENTRAL TENDON CENTRALIZATION OF LONG AND RING FINGERS;  Surgeon: Jolyn Nap, MD;  Location: [place];  Service: Orthopedics;  Laterality: Right;    Social History        Social History  .  Marital status: Married    Spouse name: N/A  . Number of children: N/A  . Years of education: N/A      Occupational History  . Not on file.         Social History Main Topics  . Smoking status: Never Smoker  . Smokeless tobacco: Never Used  . Alcohol use 4.2 oz/week    7 Glasses of wine per week     Comment: 8 oz -12 oz of wine every   . Drug use: No  . Sexual activity: Not on file       Other Topics Concern  . Not on file      Social History Narrative  . No narrative on file        Family History  Problem  Relation Age of Onset  . Stroke Mother   . Breast cancer Sister   . Ulcerative colitis Daughter   . Chronic fatigue Daughter   . Chronic fatigue Daughter       VITAL SIGNS   She is afebrile pulse of 80 respirations 18 blood pressure 103/61      Patient's Medications  New Prescriptions   No medications on file  Previous Medications   ACETAMINOPHEN (TYLENOL) 325 MG TABLET    Take 325 mg by mouth every 6 (six) hours as needed for mild pain.    ALUM & MAG HYDROXIDE-SIMETH (MAALOX/MYLANTA) 200-200-20 MG/5ML SUSPENSION    Take 15 mLs by mouth as needed for indigestion or heartburn.   AMINO ACIDS-PROTEIN HYDROLYS (PRO-STAT) LIQD    Take 30 mLs by mouth 3 (three) times daily. TO PROMOTE WOUND HEALING   B COMPLEX VITAMINS TABLET    Take 1 tablet by mouth daily as needed (for supplement).    BISACODYL (DULCOLAX) 10 MG SUPPOSITORY    Place 1 suppository (10 mg total) rectally daily as needed for moderate constipation (May repeat times one).   BUDESONIDE (RHINOCORT AQUA) 32 MCG/ACT NASAL SPRAY    Place 1 spray into both nostrils daily as needed for rhinitis.   CHOLECALCIFEROL (VITAMIN D) 2000 UNITS TABLET    Take 2,000 Units by mouth daily.    DICLOFENAC SODIUM (VOLTAREN) 1 % GEL    Apply 1 g topically 2 (two) times daily. Apply to bilateral knees for pain   DULOXETINE (CYMBALTA) 60 MG CAPSULE    Take 60 mg by mouth at bedtime.    ESOMEPRAZOLE MAGNESIUM (NEXIUM 24HR PO)    Take 22.3 mg by mouth daily.   GABAPENTIN (NEURONTIN) 600 MG TABLET    Take 0.5 tablets (300 mg total) by mouth 2 (two) times daily.   LACTOSE FREE NUTRITION (BOOST PLUS) LIQD    Take 237 mLs by mouth 2 (two) times daily between meals.   MAGNESIUM 400 MG TABS    Take 400 mg by mouth daily.   MAGNESIUM HYDROXIDE (MILK OF MAGNESIA) 400 MG/5ML SUSPENSION    Take 30 mLs by mouth every other day. FOR CONSTIPATION   METHADONE (DOLOPHINE) 5 MG TABLET    Take one tablet by mouth twice daily for pain    METHOCARBAMOL (ROBAXIN) 500 MG TABLET    Take 500 mg by mouth 3 (three) times daily.   MULTIPLE VITAMINS-MINERALS (DECUBI-VITE) CAPS    Take 1 capsule by mouth daily.   OMEGA-3 FATTY ACIDS (FISH OIL) 1000 MG CPDR    Take 1,000 mg by mouth daily.    OXYCODONE (OXY IR/ROXICODONE) 5 MG IMMEDIATE RELEASE TABLET    Take 1-2 tablets (  5-10 mg total) by mouth every 8 (eight) hours as needed. For pain   POLYETHYLENE GLYCOL (MIRALAX / GLYCOLAX) PACKET    Take 17 g by mouth daily as needed for mild constipation.   POTASSIUM CHLORIDE SA (K-DUR,KLOR-CON) 20 MEQ TABLET    Take 20 mEq by mouth daily as needed (Only takes with lasix).   SENNA (SENOKOT) 8.6 MG TABS TABLET    Take 2 tablets (17.2 mg total) by mouth at bedtime.   TIMOLOL (BETIMOL) 0.5 % OPHTHALMIC SOLUTION    Place 1 drop into both eyes 2 (two) times daily.    TORSEMIDE (DEMADEX) 20 MG TABLET    Take 40 mg by mouth daily.  Modified Medications   No medications on file  Discontinued Medications   No medicatiReview of systems.  In general is not complaining of any fever or chills.  Skin does have the abdominal wound and wound VAC is in place finishing treatment for staph aureus infection with doxycycline.  Head ears eyes nose mouth and throat has prescription lenses does not complain of visual changes or sore throat.  Respiratory does not complain of shortness breath or cough.  Cardiac does not complain of chest pain or increased lower extremity edema.  GI is not complaining of abdominal discomfort says she has good appetite does have the wound VAC as noted above.  Musculoskeletal complains of diffuse pain more so in her lateral left leg today for therapy has somewhat diffuse pain complaints.  Neurologic is not complaining of dizziness headache or numbness.  Psych does not complain of overt anxiety or depression.  Marland Kitchen  Physical exam.  In general this is a pleasant elderly female who is nondistended any distress lying  comfortably in bed.  Her skin is warm and dry she does have a wound VAC applied to her abdominal area I do not see any surrounding erythema or drainage.  Eyes she has prescription lenses visual acuity appears grossly intact.  Chest is clear to auscultation there is no labored breathing.  Heart is regular rate and rhythm she does have intact distal pulses she has 1+ lower extremity edema bilaterally bit more on the left versus the right there is no calf tenderness to palpation no erythema.  Abdomen is soft nontender wound VAC is applied as noted above bowel sounds are positive.  Muscular skeletal she does move all extremities 4 with some lower extremity weakness there is some tenderness to palpation of her left lower leg on the lateral aspect I do not note any deformity or erythema her pedal pulses are intact strength appears to be grossly intact although she does have some bilateral lower extremity weakness.  Neurologic is grossly intact her speech is clear I could not really appreciate lateralizing findings.  Psych she is alert and oriented pleasant and appropriate.  Labs.  02/15/2016.  WBC 9.5 hemoglobin 9.9 platelets 379.  Sodium 135 potassium 4.3 BUN 8.8 creatinine 0.56.  Calcium 10.7.  Assessment and plan.  ons on file    Assessment and plan.  Chronic pain-this is been a challenging situation she is on numerous medications including methadone 5 mg twice a day-Neurontin 300 mg twice a day-oxycodone 5-10 mg every 8 hours as well as Cymbalta 60 mg a day.  Will order reconsultation with the [place] pain [facility].  Also will add a Lidoderm patch to the back on 12 hours-off 12 hours apply new patch daily.  And at this point continue to monitor.  #2 in regards to abdominal  wound exam appear to be stable today she will be seen by wound care later today she has just finished a course of doxycycline will await their evaluation.  Also will order a CBC with differential and  metabolic panel for follow-up.  [id number]

## 2016-03-30 DIAGNOSIS — E43 Unspecified severe protein-calorie malnutrition: Secondary | ICD-10-CM | POA: Diagnosis not present

## 2016-03-30 DIAGNOSIS — L98493 Non-pressure chronic ulcer of skin of other sites with necrosis of muscle: Secondary | ICD-10-CM | POA: Diagnosis not present

## 2016-04-05 DIAGNOSIS — L98493 Non-pressure chronic ulcer of skin of other sites with necrosis of muscle: Secondary | ICD-10-CM | POA: Diagnosis not present

## 2016-04-08 DIAGNOSIS — K5903 Drug induced constipation: Secondary | ICD-10-CM | POA: Insufficient documentation

## 2016-04-08 DIAGNOSIS — T402X5A Adverse effect of other opioids, initial encounter: Secondary | ICD-10-CM | POA: Insufficient documentation

## 2016-04-10 ENCOUNTER — Emergency Department (HOSPITAL_COMMUNITY)
Admission: EM | Admit: 2016-04-10 | Discharge: 2016-04-10 | Disposition: A | Discharge status: Home / Self Care | Payer: PPO | Attending: Emergency Medicine | Admitting: Emergency Medicine

## 2016-04-10 ENCOUNTER — Encounter (HOSPITAL_COMMUNITY): Payer: Self-pay

## 2016-04-10 DIAGNOSIS — Z96611 Presence of right artificial shoulder joint: Secondary | ICD-10-CM | POA: Diagnosis not present

## 2016-04-10 DIAGNOSIS — I509 Heart failure, unspecified: Secondary | ICD-10-CM | POA: Insufficient documentation

## 2016-04-10 DIAGNOSIS — Z036 Encounter for observation for suspected toxic effect from ingested substance ruled out: Secondary | ICD-10-CM | POA: Diagnosis not present

## 2016-04-10 DIAGNOSIS — Z96612 Presence of left artificial shoulder joint: Secondary | ICD-10-CM | POA: Insufficient documentation

## 2016-04-10 DIAGNOSIS — Z0000 Encounter for general adult medical examination without abnormal findings: Secondary | ICD-10-CM | POA: Diagnosis not present

## 2016-04-10 DIAGNOSIS — R2689 Other abnormalities of gait and mobility: Secondary | ICD-10-CM | POA: Diagnosis not present

## 2016-04-10 DIAGNOSIS — Z79899 Other long term (current) drug therapy: Secondary | ICD-10-CM | POA: Insufficient documentation

## 2016-04-10 DIAGNOSIS — T50904A Poisoning by unspecified drugs, medicaments and biological substances, undetermined, initial encounter: Secondary | ICD-10-CM | POA: Diagnosis not present

## 2016-04-10 DIAGNOSIS — T50901A Poisoning by unspecified drugs, medicaments and biological substances, accidental (unintentional), initial encounter: Secondary | ICD-10-CM | POA: Diagnosis not present

## 2016-04-10 LAB — CBC WITH DIFFERENTIAL/PLATELET
BASOS ABS: 0 10*3/uL (ref 0.0–0.1)
BASOS PCT: 0 %
EOS ABS: 0.5 10*3/uL (ref 0.0–0.7)
Eosinophils Relative: 6 %
HEMATOCRIT: 31.4 % — AB (ref 36.0–46.0)
HEMOGLOBIN: 10 g/dL — AB (ref 12.0–15.0)
Lymphocytes Relative: 28 %
Lymphs Abs: 2.6 10*3/uL (ref 0.7–4.0)
MCH: 29.4 pg (ref 26.0–34.0)
MCHC: 31.8 g/dL (ref 30.0–36.0)
MCV: 92.4 fL (ref 78.0–100.0)
Monocytes Absolute: 0.7 10*3/uL (ref 0.1–1.0)
Monocytes Relative: 8 %
NEUTROS ABS: 5.3 10*3/uL (ref 1.7–7.7)
NEUTROS PCT: 58 %
Platelets: 392 10*3/uL (ref 150–400)
RBC: 3.4 MIL/uL — ABNORMAL LOW (ref 3.87–5.11)
RDW: 18.2 % — AB (ref 11.5–15.5)
WBC: 9.2 10*3/uL (ref 4.0–10.5)

## 2016-04-10 LAB — COMPREHENSIVE METABOLIC PANEL
ALT: 10 U/L — ABNORMAL LOW (ref 14–54)
ANION GAP: 5 (ref 5–15)
AST: 16 U/L (ref 15–41)
Albumin: 2.6 g/dL — ABNORMAL LOW (ref 3.5–5.0)
Alkaline Phosphatase: 74 U/L (ref 38–126)
BUN: 17 mg/dL (ref 6–20)
CHLORIDE: 101 mmol/L (ref 101–111)
CO2: 29 mmol/L (ref 22–32)
Calcium: 9.3 mg/dL (ref 8.9–10.3)
Creatinine, Ser: 0.59 mg/dL (ref 0.44–1.00)
Glucose, Bld: 103 mg/dL — ABNORMAL HIGH (ref 65–99)
POTASSIUM: 3.9 mmol/L (ref 3.5–5.1)
Sodium: 135 mmol/L (ref 135–145)
TOTAL PROTEIN: 7 g/dL (ref 6.5–8.1)
Total Bilirubin: 0.2 mg/dL — ABNORMAL LOW (ref 0.3–1.2)

## 2016-04-10 LAB — SALICYLATE LEVEL

## 2016-04-10 LAB — RAPID URINE DRUG SCREEN, HOSP PERFORMED
Amphetamines: NOT DETECTED
BARBITURATES: NOT DETECTED
BENZODIAZEPINES: NOT DETECTED
COCAINE: NOT DETECTED
OPIATES: NOT DETECTED
TETRAHYDROCANNABINOL: NOT DETECTED

## 2016-04-10 LAB — ETHANOL

## 2016-04-10 LAB — ACETAMINOPHEN LEVEL

## 2016-04-10 NOTE — ED Notes (Signed)
Called [facility] and [facility] facility for transport and report. Spoke to [place], [place]

## 2016-04-10 NOTE — ED Notes (Signed)
Bed: [id number] Expected date:  Expected time:  Means of arrival:  Comments: 83 yo F/

## 2016-04-10 NOTE — ED Notes (Signed)
Patient denies pain and is resting comfortably.  

## 2016-04-10 NOTE — ED Notes (Signed)
PInk oblong tablets that were brought in via EMS was verified with pharmacy to be Benadryl tablets.

## 2016-04-10 NOTE — ED Notes (Signed)
Called poison control about poss ingestion.

## 2016-04-10 NOTE — ED Notes (Signed)
Johnathan from [employer] called to discuss patients status. At this time no further recommendations. Patient will be free to discharge at ED provider's discretion around 0830.

## 2016-04-10 NOTE — ED Notes (Signed)
Patient is alert and oriented. She was given apple juice which she tolerated well. VSS. No signs of lethargy or altered mental status.

## 2016-04-10 NOTE — ED Notes (Signed)
Patient states "Those nurses at the rehab said I took Benadryl and overdose and I know I didn't. My husband brought me some Benadryl to the home today because him and my [place]. I have taken benadryl over 20 something years to help me sleep and without it I won't sleep. The girl at the nursing home gave me some benadryl last night but said I couldn't have anymore. That is why my husband brought me the pills without asking because I am 83 years old and as long as I am in my right mind noone will tell me what I can and can't have. Furthermore one benadryl makes me sleep at night so if I did indeed overdose I would be dead to the world!." Patient continues to deny ingestion or overdosing on Benadryl tablets.

## 2016-04-10 NOTE — ED Provider Notes (Signed)
[facility] DEPT Provider Note   CSN: [id number] Arrival date & time: 04/10/16  0224  By signing my name below, I, Neta Mends, attest that this documentation has been prepared under the direction and in the presence of Diontae Route, MD . Electronically Signed: Neta Mends, ED Scribe. 04/10/2016. 2:56 AM.    History   Chief Complaint Chief Complaint  Patient presents with  . Ingestion    pt states she didnt take any but facility unsure     The history is provided by the patient. No language interpreter was used.  Ingestion  This is a new problem. Episode onset: unknown. The problem occurs rarely. The problem has not changed since onset.Pertinent negatives include no chest pain, no abdominal pain, no headaches and no shortness of breath. Nothing aggravates the symptoms. Nothing relieves the symptoms. She has tried nothing for the symptoms. The treatment provided no relief.   HPI Comments:  Monique Russo is a 83 y.o. female who presents to the Emergency Department, here due to a possible overdose on Benadryl. [facility] reports that pt took too many benadryl that her husband brought to her because the facility doctor took the patient off benadryl, but pt denies having taken any benadryl. Pt called 911 herself because they would not give her benadryl. Pt arrived with a cup full of benadryl that her husband "smuggled" in to her facility because she was not being given any benadryl there. Pt denies other associated symptoms. No alleviating factors noted.   Past Medical History:  Diagnosis Date  . Anemia   . Arthritis   . CHF (congestive heart failure) ([place])   . Depression   . GERD (gastroesophageal reflux disease)   . Hyponatremia 01/2016  . Perforated chronic gastric ulcer ([place]) 08/11/2014  . Pneumonia   . Pulmonary arterial hypertension 11/28/2014    Patient Active Problem List   Diagnosis Date Noted  . Constipation due to opioid therapy 04/08/2016  .  Parotitis 02/15/2016  . Chronic narcotic use 02/15/2016  . GERD (gastroesophageal reflux disease) 02/15/2016  . Sepsis ([place]) 01/20/2016  . Acute blood loss anemia 01/20/2016  . Hypotension   . Difficulty in walking, not elsewhere classified   . Disorder of sleep wake schedule   . Protein-calorie malnutrition, severe ([place])   . Chronic bilateral low back pain without sciatica   . Pulmonary arterial hypertension 11/28/2014  . Chronic pain syndrome   . Hyponatremia 07/17/2014  . Fatigue 07/09/2013  . DNR (do not resuscitate) 07/07/2013    Past Surgical History:  Procedure Laterality Date  . ABDOMINAL HYSTERECTOMY    . ABDOMINAL SURGERY    . BACK SURGERY  2000  . COLON SURGERY     x2-blockage  . COLONOSCOPY    . ERCP    . HERNIA REPAIR  2012   x2 with mesh  . JOINT REPLACEMENT Left    shoulder  . LAPAROTOMY N/A 08/04/2014   Procedure: EXPLORATORY LAPAROTOMY, REPAIR OF PERFORATED ULCER;  Surgeon: Excell Seltzer, MD;  Location: [facility];  Service: General;  Laterality: N/A;  . LAPAROTOMY N/A 12/21/2015   Procedure: EXPLORATORY LAPAROTOMY/ REMOVAL OF MESH;  Surgeon: Coralie Keens, MD;  Location: [facility];  Service: General;  Laterality: N/A;  . ORIF WRIST FRACTURE  2012   left  . REVERSE SHOULDER ARTHROPLASTY Right 12/04/2013  . REVERSE SHOULDER ARTHROPLASTY  12/04/2013   Procedure: REVERSE SHOULDER ARTHROPLASTY;  Surgeon: Nita Sells, MD;  Location: [facility];  Service: Orthopedics;;  Right  reverse total shoulder arthroplasty  . ROTATOR CUFF REPAIR     dr Tamera Punt  . SHOULDER ARTHROSCOPY  2012   lt and rt  . TENDON REPAIR Right 04/13/2014   Procedure: RIGHT HAND CENTRAL TENDON CENTRALIZATION OF LONG AND RING FINGERS;  Surgeon: Jolyn Nap, MD;  Location: [facility];  Service: Orthopedics;  Laterality: Right;    OB History    No data available       Home Medications    Prior to Admission medications   Medication Sig Start Date End Date Taking?  Authorizing Provider  acetaminophen (TYLENOL) 325 MG tablet Take 325 mg by mouth every 6 (six) hours as needed for mild pain.  07/10/13   Melton Alar, PA-C  alum & mag hydroxide-simeth (MAALOX/MYLANTA) 200-200-20 MG/5ML suspension Take 15 mLs by mouth as needed for indigestion or heartburn. 01/06/16   Doreatha Lew, MD  Amino Acids-Protein Hydrolys (PRO-STAT) LIQD Take 30 mLs by mouth 3 (three) times daily. TO PROMOTE WOUND HEALING    Historical Provider, MD  b complex vitamins tablet Take 1 tablet by mouth daily as needed (for supplement).     Historical Provider, MD  bisacodyl (DULCOLAX) 10 MG suppository Place 1 suppository (10 mg total) rectally daily as needed for moderate constipation (May repeat times one). 07/10/13   Bobby Rumpf York, PA-C  budesonide (RHINOCORT AQUA) 32 MCG/ACT nasal spray Place 1 spray into both nostrils daily as needed for rhinitis.    Historical Provider, MD  Cholecalciferol (VITAMIN D) 2000 units tablet Take 2,000 Units by mouth daily.     Historical Provider, MD  diclofenac sodium (VOLTAREN) 1 % GEL Apply 1 g topically 2 (two) times daily. Apply to bilateral knees for pain    Historical Provider, MD  DULoxetine (CYMBALTA) 60 MG capsule Take 60 mg by mouth at bedtime.     Historical Provider, MD  Esomeprazole Magnesium (NEXIUM 24HR PO) Take 22.3 mg by mouth daily.    Historical Provider, MD  gabapentin (NEURONTIN) 600 MG tablet Take 0.5 tablets (300 mg total) by mouth 2 (two) times daily. 01/24/16   Shanker Kristeen Mans, MD  lactose free nutrition (BOOST PLUS) LIQD Take 237 mLs by mouth 2 (two) times daily between meals. 08/12/14   Earnstine Regal, PA-C  Magnesium 400 MG TABS Take 400 mg by mouth daily.    Historical Provider, MD  magnesium hydroxide (MILK OF MAGNESIA) 400 MG/5ML suspension Take 30 mLs by mouth every other day. FOR CONSTIPATION    Historical Provider, MD  methadone (DOLOPHINE) 5 MG tablet Take one tablet by mouth twice daily for pain 02/16/16   Gildardo Cranker, DO  methocarbamol (ROBAXIN) 500 MG tablet Take 500 mg by mouth 3 (three) times daily.    Historical Provider, MD  Multiple Vitamins-Minerals (DECUBI-VITE) CAPS Take 1 capsule by mouth daily.    Historical Provider, MD  Omega-3 Fatty Acids (FISH OIL) 1000 MG CPDR Take 1,000 mg by mouth daily.     Historical Provider, MD  oxyCODONE (OXY IR/ROXICODONE) 5 MG immediate release tablet Take 1-2 tablets (5-10 mg total) by mouth every 8 (eight) hours as needed. For pain 02/16/16   Gildardo Cranker, DO  polyethylene glycol [facility] / GLYCOLAX) packet Take 17 g by mouth daily as needed for mild constipation.    Historical Provider, MD  potassium chloride SA (K-DUR,KLOR-CON) 20 MEQ tablet Take 20 mEq by mouth daily as needed (Only takes with lasix).    Historical Provider, MD  senna Donavan Burnet)  8.6 MG TABS tablet Take 2 tablets (17.2 mg total) by mouth at bedtime. 01/24/16   Shanker Kristeen Mans, MD  timolol (BETIMOL) 0.5 % ophthalmic solution Place 1 drop into both eyes 2 (two) times daily.     Historical Provider, MD  torsemide (DEMADEX) 20 MG tablet Take 40 mg by mouth daily.    Historical Provider, MD    Family History Family History  Problem Relation Age of Onset  . Stroke Mother   . Breast cancer Sister   . Ulcerative colitis Daughter   . Chronic fatigue Daughter   . Chronic fatigue Daughter     Social History Social History  Substance Use Topics  . Smoking status: Never Smoker  . Smokeless tobacco: Never Used  . Alcohol use 4.2 oz/week    7 Glasses of wine per week     Comment: 8 oz -12 oz of wine every      Allergies   Tape   Review of Systems Review of Systems  Constitutional: Negative for fever.  Respiratory: Negative for shortness of breath.   Cardiovascular: Negative for chest pain.  Gastrointestinal: Negative for abdominal pain.  Skin: Positive for wound.  Neurological: Negative for headaches.  All other systems reviewed and are negative.    Physical Exam Updated  Vital Signs BP 140/84 (BP Location: Right Arm)   Pulse 86   Temp 98.5 F (36.9 C) (Oral)   Resp 20   Ht 5\' 2"  (1.575 m)   Wt 169 lb (76.7 kg)   SpO2 94%   BMI 30.91 kg/m   Physical Exam  Constitutional: She is oriented to person, place, and time. She appears well-developed and well-nourished. No distress.  HENT:  Head: Normocephalic and atraumatic.  Mouth/Throat: Oropharynx is clear and moist. No oropharyngeal exudate.  Trachea midline  Eyes: Conjunctivae and EOM are normal. Pupils are equal, round, and reactive to light.  Neck: Trachea normal and normal range of motion. Neck supple. No JVD present. Carotid bruit is not present.  Cardiovascular: Normal rate, regular rhythm and intact distal pulses.  Exam reveals no gallop and no friction rub.   No murmur heard. Pulmonary/Chest: Effort normal and breath sounds normal. No stridor. She has no wheezes. She has no rales.  Abdominal: Soft. Bowel sounds are normal. She exhibits no mass. There is no tenderness. There is no rebound and no guarding.  Musculoskeletal: Normal range of motion.  Lymphadenopathy:    She has no cervical adenopathy.  Neurological: She is alert and oriented to person, place, and time. She has normal reflexes. She displays normal reflexes. No cranial nerve deficit. She exhibits normal muscle tone. Coordination normal.  Cranial nerves 2-12 intact  Skin: Skin is warm and dry. She is not diaphoretic.  Skin warm and dry, normal color.  Psychiatric: She has a normal mood and affect. Her behavior is normal. She expresses no homicidal and no suicidal ideation. She expresses no suicidal plans and no homicidal plans.     ED Treatments / Results  DIAGNOSTIC STUDIES:  Oxygen Saturation is 100% on RA, normal by my interpretation.    COORDINATION OF CARE:  2:42 AM Discussed treatment plan with pt at bedside and pt agreed to plan.   Labs  Results for orders placed or performed during the hospital encounter of 04/10/16    Acetaminophen level  Result Value Ref Range   Acetaminophen (Tylenol), Serum <10 (L) 10 - 30 ug/mL  Comprehensive metabolic panel  Result Value Ref Range   Sodium 135  135 - 145 mmol/L   Potassium 3.9 3.5 - 5.1 mmol/L   Chloride 101 101 - 111 mmol/L   CO2 29 22 - 32 mmol/L   Glucose, Bld 103 (H) 65 - 99 mg/dL   BUN 17 6 - 20 mg/dL   Creatinine, Ser 0.59 0.44 - 1.00 mg/dL   Calcium 9.3 8.9 - 10.3 mg/dL   Total Protein 7.0 6.5 - 8.1 g/dL   Albumin 2.6 (L) 3.5 - 5.0 g/dL   AST 16 15 - 41 U/L   ALT 10 (L) 14 - 54 U/L   Alkaline Phosphatase 74 38 - 126 U/L   Total Bilirubin 0.2 (L) 0.3 - 1.2 mg/dL   GFR calc non Af Amer >60 >60 mL/min   GFR calc Af Amer >60 >60 mL/min   Anion gap 5 5 - 15  Salicylate level  Result Value Ref Range   Salicylate Lvl [id number] 2.8 - 30.0 mg/dL  Ethanol  Result Value Ref Range   Alcohol, Ethyl (B) <5 <5 mg/dL  CBC WITH DIFFERENTIAL  Result Value Ref Range   WBC 9.2 4.0 - 10.5 K/uL   RBC 3.40 (L) 3.87 - 5.11 MIL/uL   Hemoglobin 10.0 (L) 12.0 - 15.0 g/dL   HCT 31.4 (L) 36.0 - 46.0 %   MCV 92.4 78.0 - 100.0 fL   MCH 29.4 26.0 - 34.0 pg   MCHC 31.8 30.0 - 36.0 g/dL   RDW 18.2 (H) 11.5 - 15.5 %   Platelets 392 150 - 400 K/uL   Neutrophils Relative % 58 %   Neutro Abs 5.3 1.7 - 7.7 K/uL   Lymphocytes Relative 28 %   Lymphs Abs 2.6 0.7 - 4.0 K/uL   Monocytes Relative 8 %   Monocytes Absolute 0.7 0.1 - 1.0 K/uL   Eosinophils Relative 6 %   Eosinophils Absolute 0.5 0.0 - 0.7 K/uL   Basophils Relative 0 %   Basophils Absolute 0.0 0.0 - 0.1 K/uL  Rapid urine drug screen (hospital performed)  Result Value Ref Range   Opiates NONE DETECTED NONE DETECTED   Cocaine NONE DETECTED NONE DETECTED   Benzodiazepines NONE DETECTED NONE DETECTED   Amphetamines NONE DETECTED NONE DETECTED   Tetrahydrocannabinol NONE DETECTED NONE DETECTED   Barbiturates NONE DETECTED NONE DETECTED   No results found.  Procedures Procedures (including critical care  time)  Poison control contacted.  Patient is being observed.     Final Clinical Impressions(s) / ED Diagnoses  Normal exam: stable for discharge.All questions answered to patient's satisfaction. Based on history and exam patient has been appropriately medically screened and emergency conditions excluded. Patient is stable for discharge at this time. Strict return precautions given for any further episodes, persistent fever, weakness or any concerns.   I personally performed the services described in this documentation, which was scribed in my presence. The recorded information has been reviewed and is accurate.      Veatrice Kells, MD 04/10/16 [phone]

## 2016-04-10 NOTE — ED Triage Notes (Signed)
[facility] thinks she took too many benadryl pt states she didn't take any of them. Alert and oriented no respiratory or acute distress noted.

## 2016-04-12 ENCOUNTER — Non-Acute Institutional Stay (SKILLED_NURSING_FACILITY): Payer: PPO | Admitting: Adult Health

## 2016-04-12 DIAGNOSIS — G894 Chronic pain syndrome: Secondary | ICD-10-CM

## 2016-04-12 DIAGNOSIS — G4700 Insomnia, unspecified: Secondary | ICD-10-CM | POA: Diagnosis not present

## 2016-04-12 DIAGNOSIS — K5903 Drug induced constipation: Secondary | ICD-10-CM

## 2016-04-12 DIAGNOSIS — T402X5A Adverse effect of other opioids, initial encounter: Secondary | ICD-10-CM

## 2016-04-13 DIAGNOSIS — L98493 Non-pressure chronic ulcer of skin of other sites with necrosis of muscle: Secondary | ICD-10-CM | POA: Diagnosis not present

## 2016-04-19 ENCOUNTER — Non-Acute Institutional Stay (SKILLED_NURSING_FACILITY): Payer: PPO | Admitting: Internal Medicine

## 2016-04-19 DIAGNOSIS — R05 Cough: Secondary | ICD-10-CM | POA: Diagnosis not present

## 2016-04-19 DIAGNOSIS — R5383 Other fatigue: Secondary | ICD-10-CM | POA: Diagnosis not present

## 2016-04-19 DIAGNOSIS — A419 Sepsis, unspecified organism: Secondary | ICD-10-CM | POA: Diagnosis not present

## 2016-04-19 DIAGNOSIS — R059 Cough, unspecified: Secondary | ICD-10-CM

## 2016-04-19 NOTE — Progress Notes (Signed)
This is an acute visit.  Level of care skilled.  Facility is [facility] complaint-acute visit follow-up cough-also recent ER visit.   History of present illness.  Patient is an 83 year old female seen today for nursing noting a cough apparently of several days duration-speaking with patient today however she feels the cough is much better that she just had a transitory cold states "I am getting over it.  Vital signs are stable she is afebrile O2 sat ration is in the 90s on room air.  I also note she went to the emergency room on January 22 secondary to concerns of a Benadryl overdose?-Apparently her Benadryl had been discontinued but her husband brought her in some Benadryl and nursing staff was concerned that she had taken too much of this-ER exam was fairly benign labs were largely within normal limits --drug screens appeared to be unconcerned she has returntof the facility  Currently she is resting in bed comfortably vital signs are stable-is quite adamant her cough is getting better.  Past Medical History:  Diagnosis Date  . Anemia   . Arthritis   . CHF (congestive heart failure) ([place])   . Depression   . GERD (gastroesophageal reflux disease)   . Hyponatremia 01/2016  . Perforated chronic gastric ulcer ([place]) 08/11/2014  . Pneumonia   . Pulmonary arterial hypertension 11/28/2014        Patient Active Problem List   Diagnosis Date Noted  . Constipation due to opioid therapy 04/08/2016  . Parotitis 02/15/2016  . Chronic narcotic use 02/15/2016  . GERD (gastroesophageal reflux disease) 02/15/2016  . Sepsis ([place]) 01/20/2016  . Acute blood loss anemia 01/20/2016  . Hypotension   . Difficulty in walking, not elsewhere classified   . Disorder of sleep wake schedule   . Protein-calorie malnutrition, severe ([place])   . Chronic bilateral low back pain without sciatica   . Pulmonary arterial hypertension 11/28/2014  . Chronic pain syndrome     . Hyponatremia 07/17/2014  . Fatigue 07/09/2013  . DNR (do not resuscitate) 07/07/2013         Past Surgical History:  Procedure Laterality Date  . ABDOMINAL HYSTERECTOMY    . ABDOMINAL SURGERY    . BACK SURGERY  2000  . COLON SURGERY     x2-blockage  . COLONOSCOPY    . ERCP    . HERNIA REPAIR  2012   x2 with mesh  . JOINT REPLACEMENT Left    shoulder  . LAPAROTOMY N/A 08/04/2014   Procedure: EXPLORATORY LAPAROTOMY, REPAIR OF PERFORATED ULCER;  Surgeon: Excell Seltzer, MD;  Location: [facility];  Service: General;  Laterality: N/A;  . LAPAROTOMY N/A 12/21/2015   Procedure: EXPLORATORY LAPAROTOMY/ REMOVAL OF MESH;  Surgeon: Coralie Keens, MD;  Location: [facility];  Service: General;  Laterality: N/A;  . ORIF WRIST FRACTURE  2012   left  . REVERSE SHOULDER ARTHROPLASTY Right 12/04/2013  . REVERSE SHOULDER ARTHROPLASTY  12/04/2013   Procedure: REVERSE SHOULDER ARTHROPLASTY;  Surgeon: Nita Sells, MD;  Location: [facility];  Service: Orthopedics;;  Right reverse total shoulder arthroplasty  . ROTATOR CUFF REPAIR     dr Tamera Punt  . SHOULDER ARTHROSCOPY  2012   lt and rt  . TENDON REPAIR Right 04/13/2014   Procedure: RIGHT HAND CENTRAL TENDON CENTRALIZATION OF LONG AND RING FINGERS;  Surgeon: Jolyn Nap, MD;  Location: [facility];  Service: Orthopedics;  Laterality: Right;       OB History  No data available                   Prior to Admission medications   Medication Sig Start Date End Date Taking? Authorizing Provider  acetaminophen (TYLENOL) 325 MG tablet Take 325 mg by mouth every 6 (six) hours as needed for mild pain.  07/10/13   Melton Alar, PA-C  alum & mag hydroxide-simeth (MAALOX/MYLANTA) 200-200-20 MG/5ML suspension Take 15 mLs by mouth as needed for indigestion or heartburn. 01/06/16   Doreatha Lew, MD  Amino Acids-Protein Hydrolys (PRO-STAT) LIQD Take 30 mLs by mouth 3 (three) times  daily. TO PROMOTE WOUND HEALING    Historical Provider, MD  b complex vitamins tablet Take 1 tablet by mouth daily as needed (for supplement).     Historical Provider, MD  bisacodyl (DULCOLAX) 10 MG suppository Place 1 suppository (10 mg total) rectally daily as needed for moderate constipation (May repeat times one). 07/10/13   Bobby Rumpf York, PA-C  budesonide (RHINOCORT AQUA) 32 MCG/ACT nasal spray Place 1 spray into both nostrils daily as needed for rhinitis.    Historical Provider, MD  Cholecalciferol (VITAMIN D) 2000 units tablet Take 2,000 Units by mouth daily.     Historical Provider, MD  diclofenac sodium (VOLTAREN) 1 % GEL Apply 1 g topically 2 (two) times daily. Apply to bilateral knees for pain    Historical Provider, MD  DULoxetine (CYMBALTA) 60 MG capsule Take 60 mg by mouth at bedtime.     Historical Provider, MD  Esomeprazole Magnesium (NEXIUM 24HR PO) Take 22.3 mg by mouth daily.    Historical Provider, MD  gabapentin (NEURONTIN) 600 MG tablet Take 0.5 tablets (300 mg total) by mouth 2 (two) times daily. 01/24/16   Shanker Kristeen Mans, MD  lactose free nutrition (BOOST PLUS) LIQD Take 237 mLs by mouth 2 (two) times daily between meals. 08/12/14   Earnstine Regal, PA-C  Magnesium 400 MG TABS Take 400 mg by mouth daily.    Historical Provider, MD  magnesium hydroxide (MILK OF MAGNESIA) 400 MG/5ML suspension Take 30 mLs by mouth every other day. FOR CONSTIPATION    Historical Provider, MD  methadone (DOLOPHINE) 5 MG tablet Take one tablet by mouth twice daily for pain 02/16/16   Gildardo Cranker, DO  methocarbamol (ROBAXIN) 500 MG tablet Take 500 mg by mouth 3 (three) times daily.    Historical Provider, MD  Multiple Vitamins-Minerals (DECUBI-VITE) CAPS Take 1 capsule by mouth daily.    Historical Provider, MD  Omega-3 Fatty Acids (FISH OIL) 1000 MG CPDR Take 1,000 mg by mouth daily.     Historical Provider, MD  oxyCODONE (OXY IR/ROXICODONE) 5 MG  immediate release tablet Take 1-2 tablets (5-10 mg total) by mouth every 8 (eight) hours as needed. For pain 02/16/16   Gildardo Cranker, DO  polyethylene glycol [facility] / GLYCOLAX) packet Take 17 g by mouth daily as needed for mild constipation.    Historical Provider, MD  potassium chloride SA (K-DUR,KLOR-CON) 20 MEQ tablet Take 20 mEq by mouth daily as needed (Only takes with lasix).    Historical Provider, MD  senna (SENOKOT) 8.6 MG TABS tablet Take 2 tablets (17.2 mg total) by mouth at bedtime. 01/24/16   Shanker Kristeen Mans, MD  timolol (BETIMOL) 0.5 % ophthalmic solution Place 1 drop into both eyes 2 (two) times daily.     Historical Provider, MD  torsemide (DEMADEX) 20 MG tablet Take 40 mg by mouth daily.    Historical Provider, MD  Family History      Family History  Problem Relation Age of Onset  . Stroke Mother   . Breast cancer Sister   . Ulcerative colitis Daughter   . Chronic fatigue Daughter   . Chronic fatigue Daughter     Social History        Social History   Substance Use Topics   . Smoking status: Never Smoker   . Smokeless tobacco: Never Used   . Alcohol use 4.2 oz/week    7 Glasses of wine per week     Comment: 8 oz -12 oz of wine every       Allergies           Tape   Review of Systems Review of Systems  Constitutional: Negative for fever.  Respiratory: Negative for shortness of breath. Says she has a cough that is getting better   Cardiovascular: Negative for chest pain.  Gastrointestinal: Negative for abdominal pain.  Skin: Positive for wound. Of the abdomen which is chronic Neurological: Negative for headaches.  All other systems reviewed and are negative.    Physical Exam  Temperature is 98.7 pulse 96 respirations 18 blood pressure 130/70 O2 saturation is in the 90s on room air  Physical Exam  Constitutional: She is oriented to person, place, and time. She appears well-developed and well-nourished. No  distress. She is resting in bed comfortably HENT:  Head: Normocephalic and atraumatic.  Mouth/Throat: Oropharynx is clear and moist. No oropharyngeal exudate.  Trachea midline  Eyes: Conjunctivae and EOM are normal. Pupils are equal, round, and reactive to light.  Neck: Trachea normal and normal range of motion. Neck supple. No JVD present. Carotid bruit is not present.  Cardiovascular: Normal rate, regular rhythm and intact distal pulses.  Exam reveals no gallop and no friction rub.   No murmur heard. Pulmonary/Chest: Effort normal and breath sounds normal. Very minimal wheeze with expiration but this clears with cough  Abdominal: Soft. Bowel sounds are normal. She exhibits no mass. There is no tenderness. There is no rebound and no guarding. Abdominal wound currently covered  Musculoskeletal: Normal range of motion.  Lymphadenopathy:    She has no cervical adenopathy.  Neurological: She is alert and oriented to person, place, and time. She has normal reflexes. She displays normal reflexes. No cranial nerve deficit. She exhibits normal muscle tone. Coordination normal.  Cranial nerves 2-12 intact  Skin: Skin is warm and dry. She is not diaphoretic.  Skin warm and dry, normal color.  again does have an abdominal wound currently covered  Psychiatric: She has a normal mood and affect. Her behavior is normal. s.   Labs.  Gen. 22nd 2018.  WBC 9.2 hemoglobin 10.0 platelets 382.  Sodium 135 potassium 3.9 BUN 17 creatinine 0.59.  Albumin 2.6--ALT of 10.  Assessment plan.  #1-cough-suspect patient states this is getting better she does not complaining of any shortness of breath fever chills vital signs are stable she is afebrile we'll treat with Mucinex 600 mg twice a day for 5 days and monitor vital signs pulse ox every shift for 72 hours to keep an eye on her situation and make sure this continues to improve.--She also continues on Rhinocort when necessary for a history of allergic  rhinitis  In regards to sedation --lethargy concerns do not see evidence of that today she is bright alert does not complain of drowsiness does not complain of pain at this time she is on numerous medications with chronic pain--including methadone  5 mg twice a day-Neurontin 300 milligrams twice a day and oxycodone 5 or 10 mg every 8 hours when necessary she is al-so on Cymbalta 60 mg a day as well as -Robaxin 500 mg 3 times a day-we also ordered Lidoderm patch recently-this will have to be monitored    [id number].--Of note greater than 25 minutes spent assessing patient-reviewing her chart-reviewing her labs and coordinating formulating a plan of care

## 2016-04-20 DIAGNOSIS — L98492 Non-pressure chronic ulcer of skin of other sites with fat layer exposed: Secondary | ICD-10-CM | POA: Diagnosis not present

## 2016-04-21 ENCOUNTER — Encounter: Payer: Self-pay | Admitting: Adult Health

## 2016-04-21 ENCOUNTER — Non-Acute Institutional Stay (SKILLED_NURSING_FACILITY): Payer: PPO | Admitting: Adult Health

## 2016-04-21 DIAGNOSIS — I2721 Secondary pulmonary arterial hypertension: Secondary | ICD-10-CM | POA: Diagnosis not present

## 2016-04-21 DIAGNOSIS — G4720 Circadian rhythm sleep disorder, unspecified type: Secondary | ICD-10-CM

## 2016-04-21 DIAGNOSIS — F1190 Opioid use, unspecified, uncomplicated: Secondary | ICD-10-CM

## 2016-04-21 DIAGNOSIS — E43 Unspecified severe protein-calorie malnutrition: Secondary | ICD-10-CM

## 2016-04-21 DIAGNOSIS — I959 Hypotension, unspecified: Secondary | ICD-10-CM

## 2016-04-21 DIAGNOSIS — G894 Chronic pain syndrome: Secondary | ICD-10-CM | POA: Diagnosis not present

## 2016-04-21 DIAGNOSIS — G8929 Other chronic pain: Secondary | ICD-10-CM | POA: Diagnosis not present

## 2016-04-21 DIAGNOSIS — M545 Low back pain: Secondary | ICD-10-CM | POA: Diagnosis not present

## 2016-04-23 NOTE — Progress Notes (Signed)
Location:   [facility] Room Number: 120B Place of Service:  SNF (31)   CODE STATUS: full code  Allergies  Allergen Reactions  . Tape Other (See Comments)    SKIN IS VERY THIN; IT BRUISES AND TEARS EASILY!! -THX    Chief Complaint  Patient presents with  . Medical Management of Chronic Issues    routine visit    HPI:  She is a long term resident of this facility being seen for the management of her chronic illnesses. She is complaining of a cough; congestion and insomnia. She is presently on mucinex twice daily which she tells me has not been effective for her. She tells me that the restoril dose is not effective in helping her sleep.    Past Medical History:  Diagnosis Date  . Anemia   . Arthritis   . CHF (congestive heart failure) ([place])   . Depression   . GERD (gastroesophageal reflux disease)   . Hyponatremia 01/2016  . Perforated chronic gastric ulcer ([place]) 08/11/2014  . Pneumonia   . Pulmonary arterial hypertension 11/28/2014    Past Surgical History:  Procedure Laterality Date  . ABDOMINAL HYSTERECTOMY    . ABDOMINAL SURGERY    . BACK SURGERY  2000  . COLON SURGERY     x2-blockage  . COLONOSCOPY    . ERCP    . HERNIA REPAIR  2012   x2 with mesh  . JOINT REPLACEMENT Left    shoulder  . LAPAROTOMY N/A 08/04/2014   Procedure: EXPLORATORY LAPAROTOMY, REPAIR OF PERFORATED ULCER;  Surgeon: Excell Seltzer, MD;  Location: [facility];  Service: General;  Laterality: N/A;  . LAPAROTOMY N/A 12/21/2015   Procedure: EXPLORATORY LAPAROTOMY/ REMOVAL OF MESH;  Surgeon: Coralie Keens, MD;  Location: [facility];  Service: General;  Laterality: N/A;  . ORIF WRIST FRACTURE  2012   left  . REVERSE SHOULDER ARTHROPLASTY Right 12/04/2013  . REVERSE SHOULDER ARTHROPLASTY  12/04/2013   Procedure: REVERSE SHOULDER ARTHROPLASTY;  Surgeon: Nita Sells, MD;  Location: [facility];  Service: Orthopedics;;  Right reverse total shoulder arthroplasty  . ROTATOR CUFF REPAIR      dr Tamera Punt  . SHOULDER ARTHROSCOPY  2012   lt and rt  . TENDON REPAIR Right 04/13/2014   Procedure: RIGHT HAND CENTRAL TENDON CENTRALIZATION OF LONG AND RING FINGERS;  Surgeon: Jolyn Nap, MD;  Location: [facility];  Service: Orthopedics;  Laterality: Right;    Social History   Social History  . Marital status: Married    Spouse name: N/A  . Number of children: N/A  . Years of education: N/A   Occupational History  . Not on file.   Social History Main Topics  . Smoking status: Never Smoker  . Smokeless tobacco: Never Used  . Alcohol use 4.2 oz/week    7 Glasses of wine per week     Comment: 8 oz -12 oz of wine every   . Drug use: No  . Sexual activity: Not on file   Other Topics Concern  . Not on file   Social History Narrative  . No narrative on file   Family History  Problem Relation Age of Onset  . Stroke Mother   . Breast cancer Sister   . Ulcerative colitis Daughter   . Chronic fatigue Daughter   . Chronic fatigue Daughter       VITAL SIGNS BP 117/82   Pulse 83   Temp 98.4 F (36.9 C)  Resp 10   Ht _0  (1.6 m)   Wt 169 lb (76.7 kg)   SpO2 97%   BMI 29.94 kg/m   Patient's Medications  New Prescriptions   No medications on file  Previous Medications   ACETAMINOPHEN (TYLENOL) 325 MG TABLET    Take 325 mg by mouth every 6 (six) hours as needed for mild pain.    ALUM & MAG HYDROXIDE-SIMETH (MAALOX/MYLANTA) 200-200-20 MG/5ML SUSPENSION    Take 15 mLs by mouth as needed for indigestion or heartburn.   AMINO ACIDS-PROTEIN HYDROLYS (FEEDING SUPPLEMENT, PRO-STAT SUGAR FREE 64,) LIQD    Take 30 mLs by mouth 3 (three) times daily with meals.   B COMPLEX VITAMINS TABLET    Take 1 tablet by mouth daily as needed (for supplement).    BISACODYL (DULCOLAX) 10 MG SUPPOSITORY    Place 1 suppository (10 mg total) rectally daily as needed for moderate constipation (May repeat times one).   BUDESONIDE (RHINOCORT AQUA) 32 MCG/ACT NASAL SPRAY     Place 1 spray into both nostrils daily as needed for rhinitis.   CHOLECALCIFEROL (VITAMIN D) 2000 UNITS TABLET    Take 2,000 Units by mouth daily.    DICLOFENAC SODIUM (VOLTAREN) 1 % GEL    Apply 1 g topically 2 (two) times daily. Apply to bilateral knees for pain   DIPHENHYDRAMINE HCL (BENADRYL PO)    Take by mouth. Take 0.5 tablet every 6 hours as needed for itching   DULOXETINE (CYMBALTA) 60 MG CAPSULE    Take 60 mg by mouth at bedtime.    ESOMEPRAZOLE MAGNESIUM (NEXIUM 24HR PO)    Take 20 mg by mouth daily.    GABAPENTIN (NEURONTIN) 300 MG CAPSULE    Take 300 mg by mouth 2 (two) times daily.   LIDOCAINE (LIDODERM) 5 %    Place 1 patch onto the skin daily. Remove & Discard patch within 12 hours or as directed by MD   MAGNESIUM 400 MG TABS    Take 400 mg by mouth daily.   MAGNESIUM HYDROXIDE (MILK OF MAGNESIA) 400 MG/5ML SUSPENSION    Take 30 mLs by mouth every other day. FOR CONSTIPATION   METHADONE (DOLOPHINE) 5 MG TABLET    Take one tablet by mouth twice daily for pain   METHOCARBAMOL (ROBAXIN) 500 MG TABLET    Take 500 mg by mouth 3 (three) times daily.   MULTIPLE VITAMIN (MULTIVITAMIN) TABLET    Take 1 tablet by mouth daily.   OMEGA-3 FATTY ACIDS (FISH OIL) 1000 MG CPDR    Take 1,000 mg by mouth daily.    OXYCODONE (OXY IR/ROXICODONE) 5 MG IMMEDIATE RELEASE TABLET    Take 1-2 tablets (5-10 mg total) by mouth every 8 (eight) hours as needed. For pain   OXYGEN    Inhale into the lungs. 2-6 L/min to keep stat at 90 or above   POLYETHYLENE GLYCOL (MIRALAX / GLYCOLAX) PACKET    Take 17 g by mouth daily.    POTASSIUM CHLORIDE (KLOR-CON) 20 MEQ PACKET    Take 20 mEq by mouth daily.   RIVAROXABAN (XARELTO) 20 MG TABS TABLET    Take 20 mg by mouth at bedtime.   SENNA (SENOKOT) 8.6 MG TABS TABLET    Take 2 tablets (17.2 mg total) by mouth at bedtime.   TEMAZEPAM (RESTORIL) 15 MG CAPSULE    Take 7.5  mg by mouth at bedtime.    TIMOLOL (BETIMOL) 0.5 % OPHTHALMIC SOLUTION    Place 1 drop  into both  eyes 2 (two) times daily.    TORSEMIDE (DEMADEX) 20 MG TABLET    Take 40 mg by mouth one time a day on Monday, Wednesday and Friday   UNABLE TO FIND    House Supplement - Take 4 oz by mouth 2 times daily  Modified Medications   No medications on file  Discontinued Medications     SIGNIFICANT DIAGNOSTIC EXAMS   01-20-16; chest x-ray: 1. Cardiomegaly and mild interstitial edema. 2. Improved aeration.  01-21-16: ct of abdomen and pelvis: 1. Apparent wall thickening at the rectum could reflect mild proctitis. 2. Small bilateral pleural effusions noted. Bibasilar airspace opacities may reflect atelectasis or possibly infection. 3. Large right-sided hiatal hernia, containing most of the stomach. 4. Mild soft tissue inflammation suggested about the bladder. Would correlate for any evidence of cystitis. 5. Nonspecific soft tissue inflammation suggested about the transverse colon. 6. Mild inflammation about the gallbladder is nonspecific, given the underlying diffuse soft tissue inflammation about the abdomen. 7. Very large anterior abdominal wall defect again noted, with packing material and underlying soft tissue thickening. 8. Scattered diverticulosis along the distal sigmoid colon, without evidence of diverticulitis. 9. Scattered aortic atherosclerosis noted. 10. Right convex thoracolumbar scoliosis noted.   01-21-16 ct of neck: 1. Findings consistent with right parotiditis. No abscess identified. No significant lymphadenopathy. 2. Partially visualized small right pleural effusion and dependent right upper lobe opacity which may represent associated pneumonia or atelectasis.  LABS REVIEWED:   12-27-15: pre-albumin 6.9 01-20-16; wbc 13.8; hgb 10.2; hct 32.6; mcv 95.0; plt 447; gluocose 109; bun 14; creat 0.62; k+ 4.7; na++ 131; alk phos 130; albumin 1.9 01-23-16: wbc 15.1; hgb 8.7; hct 27.2; mcv 93.6; plt 381; glucose 92; bun 8; creat 0.62; k+ 3.8; na++ 134 02-15-16: wbc 9.5; hgb 9.9; hct 31;  plt 379; glucose 110; bun 9; creat 0.6; k+ 4.3; na++ 135      Review of Systems  Unable to perform ROS: Other  poor historian    Physical Exam  Constitutional: No distress.  Frail   Eyes: Conjunctivae are normal.  Neck: Neck supple. No JVD present. No thyromegaly present.  Cardiovascular: Normal rate, regular rhythm and intact distal pulses.   Murmur heard. Respiratory: Effort normal and breath sounds normal. No respiratory distress. She has no wheezes.  GI: Soft. Bowel sounds are normal. She exhibits no distension. There is no tenderness.   Musculoskeletal: She exhibits no edema.  Able to move all extremities   Lymphadenopathy:    She has no cervical adenopathy.  Neurological: She is alert.  Skin: Skin is warm and dry. She is not diaphoretic.  Abdominal surgical wound: 7.5 x 6.3 x 1.3 cm wound vac in place  Psychiatric: She has a normal mood and affect.    ASSESSMENT/ PLAN:  1. Gerd: will continue nexium 22.3 mg daily   2. Anemia: hgb 9.9; will monitor   3. Chronic pain syndrome: has chronic bilateral low back pain: is on chronic narcotic therapy: will continue methadone 5 mg twice daily robaxin 500 mg three times daily neurontin 300 mg twice daily  oxycodone 5 or 10 mg every 8 hours as needed  4. Osteoarthritis of knees: will continue voltaren gel 1 gm to knees twice daily   5.  Depression: will continue cymbalta 60 mg daily  Will increase restoril to 15 mg nightly for sleep   7. Severe protein calorie malnutrition: pre-albumin 6.9; will continue prostat 30 cc three times daily and supplements per facility  protocol  8. Lower extremity edema: will continue demadex 40 mg daily with k+ 20 meq daily  9. Constipation: will continue senna 2 tabs nightly and miralax daily as needed  10. Glaucoma: will continue timolol to both eyes twice daily   11. Status post laparotomy with removal of mesh: has large abdominal wound: will continue wound vac and will monitor her status    12. Pulmonary hypertension: PA peak pressure 41 mm Hg in 2016    13. Upper respiratory infection: she was treated with doxycycline in Dec for pneumonia. Will begin avelox 400 mg daily for 10 days with florastor twice daily for 3 weeks and will monitor her status    MD is aware of resident's narcotic use and is in agreement with current plan of care. We will attempt to wean resident as apropriate   Ok Edwards NP [facility]  Contact [phone] Monday through Friday 8am- 5pm  After hours call [phone]

## 2016-04-24 ENCOUNTER — Non-Acute Institutional Stay (SKILLED_NURSING_FACILITY): Payer: PPO | Admitting: Internal Medicine

## 2016-04-24 ENCOUNTER — Encounter: Payer: Self-pay | Admitting: Internal Medicine

## 2016-04-24 DIAGNOSIS — R05 Cough: Secondary | ICD-10-CM

## 2016-04-24 DIAGNOSIS — S31109S Unspecified open wound of abdominal wall, unspecified quadrant without penetration into peritoneal cavity, sequela: Secondary | ICD-10-CM | POA: Diagnosis not present

## 2016-04-24 DIAGNOSIS — L089 Local infection of the skin and subcutaneous tissue, unspecified: Secondary | ICD-10-CM | POA: Diagnosis not present

## 2016-04-24 DIAGNOSIS — R600 Localized edema: Secondary | ICD-10-CM

## 2016-04-24 DIAGNOSIS — G894 Chronic pain syndrome: Secondary | ICD-10-CM

## 2016-04-24 DIAGNOSIS — E43 Unspecified severe protein-calorie malnutrition: Secondary | ICD-10-CM

## 2016-04-24 DIAGNOSIS — R059 Cough, unspecified: Secondary | ICD-10-CM

## 2016-04-24 NOTE — Progress Notes (Signed)
Patient ID: Monique Russo, female   DOB: May 14, 1930, 83 y.o.   MRN: [id number]    DATE:  04/24/2016  Location:    [facility] Room Number: [place] of Service: SNF (31)   Extended Emergency Contact Information Primary Emergency Contact: Tiedt,Bill Address: [address]          [place], [place] [address] [place] Phone: [phone] Mobile Phone: [phone] Relation: Spouse Secondary Emergency Contact: Faythe Ghee Address: [address]          [place], [place] [address] [place] of [place] Phone: [phone] Mobile Phone: [phone] Relation: Sister  Advanced Directive information Does Patient Have a Medical Advance Directive?: No, Would patient like information on creating a medical advance directive?: No - Patient declined  Chief Complaint  Patient presents with  . Acute Visit    leg edema    HPI:  83 yo female long term resident seen today for worsening LE edema noticed today for therapy and nursing. Pt reports increased swelling and states she has had a nonproductive cough x 2 weeks. No relief with cough suppressant. No f/c. No CP or new SOB. Pt has a hx CHF and last 2D echo in 06/2014 revealed ED 55-60% with  NWMA; mildly dilated RV with mild reduced systolic fxn; moderate dilated RA; moderate TR; peak PA pressure 41 mm Hg. She has not used prn torsemide in >1 month. Unable to obtain weight today.  GERD - stable on nexium 22.3 mg daily   Hx Anemia - stable. Hgb 10  Chronic pain syndrome/chronic bilateral low back pain - she gets chronic narcotic therapy with methadone 5 mg twice daily; robaxin 500 mg three times daily; neurontin 300 mg twice daily;  oxycodone 5 or 10 mg every 8 hours as needed  Osteoarthritis of knees - stable on voltaren gel 1 gm to knees twice daily   Depression - stable on cymbalta 60 mg daily   Severe protein calorie malnutrition -  pre-albumin 6.9; she gets prostat 30 cc three times daily and supplements  per facility protocol  Lower extremity edema - has not used prn demadex 40 mg daily with k+ 20 meq daily  Constipation - stable on senna 2 tabs nightly and miralax daily as needed  Glaucoma - stable on timolol to both eyes twice daily   Status post laparotomy with removal of mesh - she has a large abdominal wound and has a wound vac    Pulmonary hypertension - PA peak pressure 41 mm Hg in 2016       Past Medical History:  Diagnosis Date  . Anemia   . Arthritis   . CHF (congestive heart failure) ([place])   . Depression   . GERD (gastroesophageal reflux disease)   . Hyponatremia 01/2016  . Perforated chronic gastric ulcer ([place]) 08/11/2014  . Pneumonia   . Pulmonary arterial hypertension 11/28/2014    Past Surgical History:  Procedure Laterality Date  . ABDOMINAL HYSTERECTOMY    . ABDOMINAL SURGERY    . BACK SURGERY  2000  . COLON SURGERY     x2-blockage  . COLONOSCOPY    . ERCP    . HERNIA REPAIR  2012   x2 with mesh  . JOINT REPLACEMENT Left    shoulder  . LAPAROTOMY N/A 08/04/2014   Procedure: EXPLORATORY LAPAROTOMY, REPAIR OF PERFORATED ULCER;  Surgeon: Excell Seltzer, MD;  Location: [facility];  Service: General;  Laterality: N/A;  . LAPAROTOMY N/A 12/21/2015  Procedure: EXPLORATORY LAPAROTOMY/ REMOVAL OF MESH;  Surgeon: Coralie Keens, MD;  Location: [facility];  Service: General;  Laterality: N/A;  . ORIF WRIST FRACTURE  2012   left  . REVERSE SHOULDER ARTHROPLASTY Right 12/04/2013  . REVERSE SHOULDER ARTHROPLASTY  12/04/2013   Procedure: REVERSE SHOULDER ARTHROPLASTY;  Surgeon: Nita Sells, MD;  Location: [facility] OR;  Service: Orthopedics;;  Right reverse total shoulder arthroplasty  . ROTATOR CUFF REPAIR     dr Tamera Punt  . SHOULDER ARTHROSCOPY  2012   lt and rt  . TENDON REPAIR Right 04/13/2014   Procedure: RIGHT HAND CENTRAL TENDON CENTRALIZATION OF LONG AND RING FINGERS;  Surgeon: Jolyn Nap, MD;  Location: [facility];  Service:  Orthopedics;  Laterality: Right;    Patient Care Team: [name], MD as PCP - General (Internal Medicine)  Social History   Social History  . Marital status: Married    Spouse name: N/A  . Number of children: N/A  . Years of education: N/A   Occupational History  . Not on file.   Social History Main Topics  . Smoking status: Never Smoker  . Smokeless tobacco: Never Used  . Alcohol use 4.2 oz/week    7 Glasses of wine per week     Comment: 8 oz -12 oz of wine every   . Drug use: No  . Sexual activity: Not on file   Other Topics Concern  . Not on file   Social History Narrative  . No narrative on file     reports that she has never smoked. She has never used smokeless tobacco. She reports that she drinks about 4.2 oz of alcohol per week . She reports that she does not use drugs.  Family History  Problem Relation Age of Onset  . Stroke Mother   . Breast cancer Sister   . Ulcerative colitis Daughter   . Chronic fatigue Daughter   . Chronic fatigue Daughter    Family Status  Relation Status  . Mother Deceased  . Father Deceased at age 44   accident  . Sister Alive  . Brother Alive  . Sister Alive  . Sister Alive  . Brother Alive  . Son Alive  . Daughter Alive  . Daughter Alive  . Son Alive    Immunization History  Administered Date(s) Administered  . Influenza Split 02/03/2015    Allergies  Allergen Reactions  . Tape Other (See Comments)    SKIN IS VERY THIN; IT BRUISES AND TEARS EASILY!! -THX    Medications: Patient's Medications  New Prescriptions   No medications on file  Previous Medications   ACETAMINOPHEN (TYLENOL) 325 MG TABLET    Take 325 mg by mouth every 6 (six) hours as needed for mild pain.    ALUM & MAG HYDROXIDE-SIMETH (MAALOX/MYLANTA) 200-200-20 MG/5ML SUSPENSION    Take 15 mLs by mouth as needed for indigestion or heartburn.   AMINO ACIDS-PROTEIN HYDROLYS (FEEDING SUPPLEMENT, PRO-STAT SUGAR FREE 64,) LIQD    Take 30 mLs by  mouth 3 (three) times daily with meals.   B COMPLEX VITAMINS TABLET    Take 1 tablet by mouth daily as needed (for supplement).    BISACODYL (DULCOLAX) 10 MG SUPPOSITORY    Place 1 suppository (10 mg total) rectally daily as needed for moderate constipation (May repeat times one).   BUDESONIDE (RHINOCORT AQUA) 32 MCG/ACT NASAL SPRAY    Place 1 spray into both nostrils daily as needed for rhinitis.  CHOLECALCIFEROL (VITAMIN D) 2000 UNITS TABLET    Take 2,000 Units by mouth daily.    DICLOFENAC SODIUM (VOLTAREN) 1 % GEL    Apply 1 g topically 2 (two) times daily. Apply to bilateral knees for pain   DIPHENHYDRAMINE HCL (BENADRYL PO)    Take by mouth. Take 0.5 tablet every 6 hours as needed for itching   DULOXETINE (CYMBALTA) 60 MG CAPSULE    Take 60 mg by mouth at bedtime.    ESOMEPRAZOLE MAGNESIUM (NEXIUM 24HR PO)    Take 20 mg by mouth daily.    GABAPENTIN (NEURONTIN) 300 MG CAPSULE    Take 300 mg by mouth 2 (two) times daily.   LACTOSE FREE NUTRITION (BOOST PLUS) LIQD    Take 237 mLs by mouth 2 (two) times daily between meals.   LIDOCAINE (LIDODERM) 5 %    Place 1 patch onto the skin daily. Remove & Discard patch within 12 hours or as directed by MD   MAGNESIUM 400 MG TABS    Take 400 mg by mouth daily.   MAGNESIUM HYDROXIDE (MILK OF MAGNESIA) 400 MG/5ML SUSPENSION    Take 30 mLs by mouth every other day. FOR CONSTIPATION   METHADONE (DOLOPHINE) 5 MG TABLET    Take one tablet by mouth twice daily for pain   METHOCARBAMOL (ROBAXIN) 500 MG TABLET    Take 500 mg by mouth 3 (three) times daily.   MULTIPLE VITAMIN (MULTIVITAMIN) TABLET    Take 1 tablet by mouth daily.   OMEGA-3 FATTY ACIDS (FISH OIL) 1000 MG CPDR    Take 1,000 mg by mouth daily.    OXYCODONE (OXY IR/ROXICODONE) 5 MG IMMEDIATE RELEASE TABLET    Take 1-2 tablets (5-10 mg total) by mouth every 8 (eight) hours as needed. For pain   OXYGEN    Inhale into the lungs. 2-6 L/min to keep stat at 90 or above   POLYETHYLENE GLYCOL (MIRALAX /  GLYCOLAX) PACKET    Take 17 g by mouth daily.    POTASSIUM CHLORIDE (KLOR-CON) 20 MEQ PACKET    Take 20 mEq by mouth daily.   SACCHAROMYCES BOULARDII (FLORASTOR) 250 MG CAPSULE    Take 250 mg by mouth 2 (two) times daily.   SENNA (SENOKOT) 8.6 MG TABS TABLET    Take 2 tablets (17.2 mg total) by mouth at bedtime.   TEMAZEPAM (RESTORIL) 15 MG CAPSULE    Take 15 mg by mouth at bedtime.    TIMOLOL (BETIMOL) 0.5 % OPHTHALMIC SOLUTION    Place 1 drop into both eyes 2 (two) times daily.    TORSEMIDE (DEMADEX) 20 MG TABLET    Take 40 mg by mouth daily as needed.   Modified Medications   No medications on file  Discontinued Medications   GUAIFENESIN (MUCINEX) 600 MG 12 HR TABLET    Take by mouth. Take one tablet two time a day for congestion/cough for 5 days, finish 04/25/15   MULTIPLE VITAMINS-MINERALS (DECUBI-VITE) CAPS    Take 1 capsule by mouth daily.    Review of Systems  Cardiovascular: Positive for leg swelling.  Musculoskeletal: Positive for arthralgias and gait problem.  All other systems reviewed and are negative.   Vitals:   04/24/16 1609  BP: 110/66  Pulse: 72  Resp: 18  Temp: 98 F (36.7 C)  TempSrc: Oral  SpO2: 97%   There is no height or weight on file to calculate BMI.  Physical Exam  Constitutional: She is oriented to person, place, and time.  She appears well-developed.  Frail appearing in NAD lying in bed  HENT:  Mouth/Throat: Oropharynx is clear and moist. No oropharyngeal exudate.  Neck: Neck supple.  Cardiovascular: Normal rate, regular rhythm and intact distal pulses.  Exam reveals no gallop and no friction rub.   Murmur (1/6 SEM) heard. +1 pitting R>L LE edema; no calf TTP; no palpable cords; neg Homan's sign  Pulmonary/Chest: Effort normal. No respiratory distress. She has wheezes (end expiratory with prolonged expiratory phase). She has no rales. She exhibits no tenderness.  Musculoskeletal: She exhibits edema and tenderness.  Lymphadenopathy:    She has no  cervical adenopathy.  Neurological: She is alert and oriented to person, place, and time.  Skin: Skin is warm and dry. No rash noted.  Psychiatric: She has a normal mood and affect. Her behavior is normal.     Labs reviewed: Admission on 04/10/2016, Discharged on 04/10/2016  Component Date Value Ref Range Status  . Acetaminophen (Tylenol), Serum 04/10/2016 <10* 10 - 30 ug/mL Final   Comment:        THERAPEUTIC CONCENTRATIONS VARY SIGNIFICANTLY. A RANGE OF 10-30 ug/mL MAY BE AN EFFECTIVE CONCENTRATION FOR MANY PATIENTS. HOWEVER, SOME ARE BEST TREATED AT CONCENTRATIONS OUTSIDE THIS RANGE. ACETAMINOPHEN CONCENTRATIONS >150 ug/mL AT 4 HOURS AFTER INGESTION AND >50 ug/mL AT 12 HOURS AFTER INGESTION ARE OFTEN ASSOCIATED WITH TOXIC REACTIONS.   Marland Kitchen Sodium 04/10/2016 135  135 - 145 mmol/L Final  . Potassium 04/10/2016 3.9  3.5 - 5.1 mmol/L Final  . Chloride 04/10/2016 101  101 - 111 mmol/L Final  . CO2 04/10/2016 29  22 - 32 mmol/L Final  . Glucose, Bld 04/10/2016 103* 65 - 99 mg/dL Final  . BUN 04/10/2016 17  6 - 20 mg/dL Final  . Creatinine, Ser 04/10/2016 0.59  0.44 - 1.00 mg/dL Final  . Calcium 04/10/2016 9.3  8.9 - 10.3 mg/dL Final  . Total Protein 04/10/2016 7.0  6.5 - 8.1 g/dL Final  . Albumin 04/10/2016 2.6* 3.5 - 5.0 g/dL Final  . AST 04/10/2016 16  15 - 41 U/L Final  . ALT 04/10/2016 10* 14 - 54 U/L Final  . Alkaline Phosphatase 04/10/2016 74  38 - 126 U/L Final  . Total Bilirubin 04/10/2016 0.2* 0.3 - 1.2 mg/dL Final  . GFR calc non Af Amer 04/10/2016 >60  >60 mL/min Final  . GFR calc Af Amer 04/10/2016 >60  >60 mL/min Final   Comment: (NOTE) The eGFR has been calculated using the CKD EPI equation. This calculation has not been validated in all clinical situations. eGFR's persistently <60 mL/min signify possible Chronic Kidney Disease.   . Anion gap 04/10/2016 5  5 - 15 Final  . Salicylate Lvl 30/16/0109 <7.0  2.8 - 30.0 mg/dL Final  . Alcohol, Ethyl (B) 04/10/2016  <5  <5 mg/dL Final   Comment:        LOWEST DETECTABLE LIMIT FOR SERUM ALCOHOL IS 5 mg/dL FOR MEDICAL PURPOSES ONLY   . WBC 04/10/2016 9.2  4.0 - 10.5 K/uL Final  . RBC 04/10/2016 3.40* 3.87 - 5.11 MIL/uL Final  . Hemoglobin 04/10/2016 10.0* 12.0 - 15.0 g/dL Final  . HCT 04/10/2016 31.4* 36.0 - 46.0 % Final  . MCV 04/10/2016 92.4  78.0 - 100.0 fL Final  . MCH 04/10/2016 29.4  26.0 - 34.0 pg Final  . MCHC 04/10/2016 31.8  30.0 - 36.0 g/dL Final  . RDW 04/10/2016 18.2* 11.5 - 15.5 % Final  . Platelets 04/10/2016 392  150 -  400 K/uL Final  . Neutrophils Relative % 04/10/2016 58  % Final  . Neutro Abs 04/10/2016 5.3  1.7 - 7.7 K/uL Final  . Lymphocytes Relative 04/10/2016 28  % Final  . Lymphs Abs 04/10/2016 2.6  0.7 - 4.0 K/uL Final  . Monocytes Relative 04/10/2016 8  % Final  . Monocytes Absolute 04/10/2016 0.7  0.1 - 1.0 K/uL Final  . Eosinophils Relative 04/10/2016 6  % Final  . Eosinophils Absolute 04/10/2016 0.5  0.0 - 0.7 K/uL Final  . Basophils Relative 04/10/2016 0  % Final  . Basophils Absolute 04/10/2016 0.0  0.0 - 0.1 K/uL Final  . Opiates 04/10/2016 NONE DETECTED  NONE DETECTED Final  . Cocaine 04/10/2016 NONE DETECTED  NONE DETECTED Final  . Benzodiazepines 04/10/2016 NONE DETECTED  NONE DETECTED Final  . Amphetamines 04/10/2016 NONE DETECTED  NONE DETECTED Final  . Tetrahydrocannabinol 04/10/2016 NONE DETECTED  NONE DETECTED Final  . Barbiturates 04/10/2016 NONE DETECTED  NONE DETECTED Final   Comment:        DRUG SCREEN FOR MEDICAL PURPOSES ONLY.  IF CONFIRMATION IS NEEDED FOR ANY PURPOSE, NOTIFY LAB WITHIN 5 DAYS.        LOWEST DETECTABLE LIMITS FOR URINE DRUG SCREEN Drug Class       Cutoff (ng/mL) Amphetamine      1000 Barbiturate      200 Benzodiazepine   301 Tricyclics       601 Opiates          300 Cocaine          300 THC              50   Nursing Home on 02/21/2016  Component Date Value Ref Range Status  . Hemoglobin 02/15/2016 9.9* 12.0 - 16.0  g/dL Final  . HCT 02/15/2016 31* 36 - 46 % Final  . Platelets 02/15/2016 379  150 - 399 K/L Final  . WBC 02/15/2016 9.5  10^3/mL Final  . Glucose 02/15/2016 110  mg/dL Final  . BUN 02/15/2016 9  4 - 21 mg/dL Final  . Creatinine 02/15/2016 0.6  0.5 - 1.1 mg/dL Final  . Potassium 02/15/2016 4.3  3.4 - 5.3 mmol/L Final  . Sodium 02/15/2016 135* 137 - 147 mmol/L Final  . Hemoglobin 02/20/2016 9.3* 12.0 - 16.0 g/dL Final  . HCT 02/20/2016 29* 36 - 46 % Final  . Platelets 02/20/2016 358  150 - 399 K/L Final  . WBC 02/20/2016 14.0  10^3/mL Final  Admission on 01/20/2016, Discharged on 01/24/2016  Component Date Value Ref Range Status  . Sodium 01/20/2016 131* 135 - 145 mmol/L Final  . Potassium 01/20/2016 4.7  3.5 - 5.1 mmol/L Final  . Chloride 01/20/2016 92* 101 - 111 mmol/L Final  . CO2 01/20/2016 32  22 - 32 mmol/L Final  . Glucose, Bld 01/20/2016 109* 65 - 99 mg/dL Final  . BUN 01/20/2016 14  6 - 20 mg/dL Final  . Creatinine, Ser 01/20/2016 0.62  0.44 - 1.00 mg/dL Final  . Calcium 01/20/2016 10.1  8.9 - 10.3 mg/dL Final  . Total Protein 01/20/2016 7.2  6.5 - 8.1 g/dL Final  . Albumin 01/20/2016 1.9* 3.5 - 5.0 g/dL Final  . AST 01/20/2016 25  15 - 41 U/L Final  . ALT 01/20/2016 14  14 - 54 U/L Final  . Alkaline Phosphatase 01/20/2016 130* 38 - 126 U/L Final  . Total Bilirubin 01/20/2016 0.3  0.3 - 1.2 mg/dL Final  . GFR calc  non Af Amer 01/20/2016 >60  >60 mL/min Final  . GFR calc Af Amer 01/20/2016 >60  >60 mL/min Final   Comment: (NOTE) The eGFR has been calculated using the CKD EPI equation. This calculation has not been validated in all clinical situations. eGFR's persistently <60 mL/min signify possible Chronic Kidney Disease.   . Anion gap 01/20/2016 7  5 - 15 Final  . I-stat hCG, quantitative 01/20/2016 <5.0  <5 mIU/mL Final  . Comment 3 01/20/2016          Final   Comment:   GEST. AGE      CONC.  (mIU/mL)   <=1 WEEK        5 - 50     2 WEEKS       50 - 500     3 WEEKS        100 - 10,000     4 WEEKS     1,000 - 30,000        FEMALE AND NON-PREGNANT FEMALE:     LESS THAN 5 mIU/mL   . Lactic Acid, Venous 01/20/2016 1.06  0.5 - 1.9 mmol/L Final  . WBC 01/20/2016 13.8* 4.0 - 10.5 K/uL Final  . RBC 01/20/2016 3.43* 3.87 - 5.11 MIL/uL Final  . Hemoglobin 01/20/2016 10.2* 12.0 - 15.0 g/dL Final  . HCT 01/20/2016 32.6* 36.0 - 46.0 % Final  . MCV 01/20/2016 95.0  78.0 - 100.0 fL Final  . MCH 01/20/2016 29.7  26.0 - 34.0 pg Final  . MCHC 01/20/2016 31.3  30.0 - 36.0 g/dL Final  . RDW 01/20/2016 14.7  11.5 - 15.5 % Final  . Platelets 01/20/2016 447* 150 - 400 K/uL Final  . Neutrophils Relative % 01/20/2016 72  % Final  . Neutro Abs 01/20/2016 9.9* 1.7 - 7.7 K/uL Final  . Lymphocytes Relative 01/20/2016 16  % Final  . Lymphs Abs 01/20/2016 2.3  0.7 - 4.0 K/uL Final  . Monocytes Relative 01/20/2016 9  % Final  . Monocytes Absolute 01/20/2016 1.2* 0.1 - 1.0 K/uL Final  . Eosinophils Relative 01/20/2016 3  % Final  . Eosinophils Absolute 01/20/2016 0.4  0.0 - 0.7 K/uL Final  . Basophils Relative 01/20/2016 0  % Final  . Basophils Absolute 01/20/2016 0.0  0.0 - 0.1 K/uL Final  . Specimen Description 01/20/2016 BLOOD LEFT FOREARM   Final  . Special Requests 01/20/2016 BOTTLES DRAWN AEROBIC AND ANAEROBIC 5CC   Final  . Culture 01/20/2016 NO GROWTH 5 DAYS   Final  . Report Status 01/20/2016 01/25/2016 FINAL   Final  . Specimen Description 01/20/2016 BLOOD RIGHT ANTECUBITAL   Final  . Special Requests 01/20/2016 BOTTLES DRAWN AEROBIC AND ANAEROBIC 5CC   Final  . Culture 01/20/2016 NO GROWTH 5 DAYS   Final  . Report Status 01/20/2016 01/25/2016 FINAL   Final  . Specimen Description 01/20/2016 URINE, RANDOM   Final  . Special Requests 01/20/2016 NONE   Final  . Culture 01/20/2016 10,000 COLONIES/mL YEAST*  Final  . Report Status 01/20/2016 01/22/2016 FINAL   Final  . Color, Urine 01/20/2016 YELLOW  YELLOW Final  . APPearance 01/20/2016 CLOUDY* CLEAR Final  .  Specific Gravity, Urine 01/20/2016 1.015  1.005 - 1.030 Final  . pH 01/20/2016 7.0  5.0 - 8.0 Final  . Glucose, UA 01/20/2016 NEGATIVE  NEGATIVE mg/dL Final  . Hgb urine dipstick 01/20/2016 NEGATIVE  NEGATIVE Final  . Bilirubin Urine 01/20/2016 NEGATIVE  NEGATIVE Final  . Ketones, ur 01/20/2016 NEGATIVE  NEGATIVE mg/dL Final  . Protein, ur 01/20/2016 NEGATIVE  NEGATIVE mg/dL Final  . Nitrite 01/20/2016 NEGATIVE  NEGATIVE Final  . Leukocytes, UA 01/20/2016 TRACE* NEGATIVE Final  . Glucose-Capillary 01/20/2016 99  65 - 99 mg/dL Final  . Procalcitonin 01/20/2016 <0.10  ng/mL Final   Comment:        Interpretation: PCT (Procalcitonin) <= 0.5 ng/mL: Systemic infection (sepsis) is not likely. Local bacterial infection is possible. (NOTE)         ICU PCT Algorithm               Non ICU PCT Algorithm    ----------------------------     ------------------------------         PCT < 0.25 ng/mL                 PCT < 0.1 ng/mL     Stopping of antibiotics            Stopping of antibiotics       strongly encouraged.               strongly encouraged.    ----------------------------     ------------------------------       PCT level decrease by               PCT < 0.25 ng/mL       >= 80% from peak PCT       OR PCT 0.25 - 0.5 ng/mL          Stopping of antibiotics                                             encouraged.     Stopping of antibiotics           encouraged.    ----------------------------     ------------------------------       PCT level decrease by              PCT >= 0.25 ng/mL       < 80% from peak PCT        AND PCT >= 0.5 ng/mL            Continuin                          g antibiotics                                              encouraged.       Continuing antibiotics            encouraged.    ----------------------------     ------------------------------     PCT level increase compared          PCT > 0.5 ng/mL         with peak PCT AND          PCT >= 0.5 ng/mL              Escalation of antibiotics  strongly encouraged.      Escalation of antibiotics        strongly encouraged.   . Sodium 01/21/2016 133* 135 - 145 mmol/L Final  . Potassium 01/21/2016 4.0  3.5 - 5.1 mmol/L Final  . Chloride 01/21/2016 98* 101 - 111 mmol/L Final  . CO2 01/21/2016 28  22 - 32 mmol/L Final  . Glucose, Bld 01/21/2016 109* 65 - 99 mg/dL Final  . BUN 01/21/2016 11  6 - 20 mg/dL Final  . Creatinine, Ser 01/21/2016 0.55  0.44 - 1.00 mg/dL Final  . Calcium 01/21/2016 9.3  8.9 - 10.3 mg/dL Final  . GFR calc non Af Amer 01/21/2016 >60  >60 mL/min Final  . GFR calc Af Amer 01/21/2016 >60  >60 mL/min Final   Comment: (NOTE) The eGFR has been calculated using the CKD EPI equation. This calculation has not been validated in all clinical situations. eGFR's persistently <60 mL/min signify possible Chronic Kidney Disease.   . Anion gap 01/21/2016 7  5 - 15 Final  . WBC 01/21/2016 12.4* 4.0 - 10.5 K/uL Final  . RBC 01/21/2016 2.92* 3.87 - 5.11 MIL/uL Final  . Hemoglobin 01/21/2016 8.7* 12.0 - 15.0 g/dL Final  . HCT 01/21/2016 27.6* 36.0 - 46.0 % Final  . MCV 01/21/2016 94.5  78.0 - 100.0 fL Final  . MCH 01/21/2016 29.8  26.0 - 34.0 pg Final  . MCHC 01/21/2016 31.5  30.0 - 36.0 g/dL Final  . RDW 01/21/2016 14.6  11.5 - 15.5 % Final  . Platelets 01/21/2016 414* 150 - 400 K/uL Final  . Sodium, Ur 01/20/2016 80  mmol/L Final  . Osmolality, Ur 01/20/2016 413  300 - 900 mOsm/kg Final  . Osmolality 01/20/2016 278  275 - 295 mOsm/kg Final  . Squamous Epithelial / LPF 01/20/2016 6-30* NONE SEEN Final  . WBC, UA 01/20/2016 6-30  0 - 5 WBC/hpf Final  . RBC / HPF 01/20/2016 0-5  0 - 5 RBC/hpf Final  . Bacteria, UA 01/20/2016 FEW* NONE SEEN Final  . Urine-Other 01/20/2016 YEAST PRESENT   Final  . Procalcitonin 01/22/2016 0.15  ng/mL Final   Comment:        Interpretation: PCT (Procalcitonin) <= 0.5 ng/mL: Systemic infection (sepsis) is not  likely. Local bacterial infection is possible. (NOTE)         ICU PCT Algorithm               Non ICU PCT Algorithm    ----------------------------     ------------------------------         PCT < 0.25 ng/mL                 PCT < 0.1 ng/mL     Stopping of antibiotics            Stopping of antibiotics       strongly encouraged.               strongly encouraged.    ----------------------------     ------------------------------       PCT level decrease by               PCT < 0.25 ng/mL       >= 80% from peak PCT       OR PCT 0.25 - 0.5 ng/mL          Stopping of antibiotics  encouraged.     Stopping of antibiotics           encouraged.    ----------------------------     ------------------------------       PCT level decrease by              PCT >= 0.25 ng/mL       < 80% from peak PCT        AND PCT >= 0.5 ng/mL            Continuin                          g antibiotics                                              encouraged.       Continuing antibiotics            encouraged.    ----------------------------     ------------------------------     PCT level increase compared          PCT > 0.5 ng/mL         with peak PCT AND          PCT >= 0.5 ng/mL             Escalation of antibiotics                                          strongly encouraged.      Escalation of antibiotics        strongly encouraged.   . WBC 01/22/2016 13.6* 4.0 - 10.5 K/uL Final  . RBC 01/22/2016 2.85* 3.87 - 5.11 MIL/uL Final  . Hemoglobin 01/22/2016 8.5* 12.0 - 15.0 g/dL Final  . HCT 01/22/2016 26.9* 36.0 - 46.0 % Final  . MCV 01/22/2016 94.4  78.0 - 100.0 fL Final  . MCH 01/22/2016 29.8  26.0 - 34.0 pg Final  . MCHC 01/22/2016 31.6  30.0 - 36.0 g/dL Final  . RDW 01/22/2016 14.5  11.5 - 15.5 % Final  . Platelets 01/22/2016 397  150 - 400 K/uL Final  . Sodium 01/22/2016 133* 135 - 145 mmol/L Final  . Potassium 01/22/2016 3.9  3.5 - 5.1 mmol/L Final  .  Chloride 01/22/2016 100* 101 - 111 mmol/L Final  . CO2 01/22/2016 29  22 - 32 mmol/L Final  . Glucose, Bld 01/22/2016 105* 65 - 99 mg/dL Final  . BUN 01/22/2016 12  6 - 20 mg/dL Final  . Creatinine, Ser 01/22/2016 0.64  0.44 - 1.00 mg/dL Final  . Calcium 01/22/2016 9.5  8.9 - 10.3 mg/dL Final  . GFR calc non Af Amer 01/22/2016 >60  >60 mL/min Final  . GFR calc Af Amer 01/22/2016 >60  >60 mL/min Final   Comment: (NOTE) The eGFR has been calculated using the CKD EPI equation. This calculation has not been validated in all clinical situations. eGFR's persistently <60 mL/min signify possible Chronic Kidney Disease.   . Anion gap 01/22/2016 4* 5 - 15 Final  . WBC 01/23/2016 15.1* 4.0 - 10.5 K/uL Final  . RBC 01/23/2016 2.96* 3.87 - 5.11 MIL/uL Final  . Hemoglobin 01/23/2016 8.7* 12.0 - 15.0 g/dL Final  . HCT 01/23/2016  27.7* 36.0 - 46.0 % Final  . MCV 01/23/2016 93.6  78.0 - 100.0 fL Final  . MCH 01/23/2016 29.4  26.0 - 34.0 pg Final  . MCHC 01/23/2016 31.4  30.0 - 36.0 g/dL Final  . RDW 01/23/2016 14.6  11.5 - 15.5 % Final  . Platelets 01/23/2016 381  150 - 400 K/uL Final  . Sodium 01/23/2016 134* 135 - 145 mmol/L Final  . Potassium 01/23/2016 3.8  3.5 - 5.1 mmol/L Final  . Chloride 01/23/2016 100* 101 - 111 mmol/L Final  . CO2 01/23/2016 28  22 - 32 mmol/L Final  . Glucose, Bld 01/23/2016 92  65 - 99 mg/dL Final  . BUN 01/23/2016 8  6 - 20 mg/dL Final  . Creatinine, Ser 01/23/2016 0.62  0.44 - 1.00 mg/dL Final  . Calcium 01/23/2016 9.4  8.9 - 10.3 mg/dL Final  . GFR calc non Af Amer 01/23/2016 >60  >60 mL/min Final  . GFR calc Af Amer 01/23/2016 >60  >60 mL/min Final   Comment: (NOTE) The eGFR has been calculated using the CKD EPI equation. This calculation has not been validated in all clinical situations. eGFR's persistently <60 mL/min signify possible Chronic Kidney Disease.   . Anion gap 01/23/2016 6  5 - 15 Final  . Procalcitonin 01/24/2016 <0.10  ng/mL Final   Comment:         Interpretation: PCT (Procalcitonin) <= 0.5 ng/mL: Systemic infection (sepsis) is not likely. Local bacterial infection is possible. (NOTE)         ICU PCT Algorithm               Non ICU PCT Algorithm    ----------------------------     ------------------------------         PCT < 0.25 ng/mL                 PCT < 0.1 ng/mL     Stopping of antibiotics            Stopping of antibiotics       strongly encouraged.               strongly encouraged.    ----------------------------     ------------------------------       PCT level decrease by               PCT < 0.25 ng/mL       >= 80% from peak PCT       OR PCT 0.25 - 0.5 ng/mL          Stopping of antibiotics                                             encouraged.     Stopping of antibiotics           encouraged.    ----------------------------     ------------------------------       PCT level decrease by              PCT >= 0.25 ng/mL       < 80% from peak PCT        AND PCT >= 0.5 ng/mL            Continuin  g antibiotics                                              encouraged.       Continuing antibiotics            encouraged.    ----------------------------     ------------------------------     PCT level increase compared          PCT > 0.5 ng/mL         with peak PCT AND          PCT >= 0.5 ng/mL             Escalation of antibiotics                                          strongly encouraged.      Escalation of antibiotics        strongly encouraged.     No results found.   Assessment/Plan   ICD-9-CM ICD-10-CM   1. Bilateral leg edema 782.3 R60.0    with hx CHF  2. Cough 786.2 R05   3. Chronic pain syndrome 338.4 G89.4   4. Protein-calorie malnutrition, severe ([place]) 262 E43   5. Chronic wound infection of abdomen, sequela - has wound vac 906.0 S31.109S     L08.9     Check stat CXR to r/o CHF exacerbation  Change to torsemide 71m po 3 times per week MWF - 1st dose now  Stat  CMP, BNP  Daily weight  And call if weight gain > 2 lbs in 24 hrs or >5 lbs in 3 days.  Right leg stat doppler venous uKoreato r/o dvt  T/c 2D echo to further assess EF  Cont other meds as ordered  Will follow  Marsella Suman S. CPerlie Gold PVirginia Mason Medical Centerand Adult Medicine [address] [place] [phone] (Monday-Friday 8 AM - 5 PM) [phone] 5 PM and follow prompts

## 2016-04-25 ENCOUNTER — Non-Acute Institutional Stay (SKILLED_NURSING_FACILITY): Payer: PPO | Admitting: Adult Health

## 2016-04-25 DIAGNOSIS — I82413 Acute embolism and thrombosis of femoral vein, bilateral: Secondary | ICD-10-CM | POA: Diagnosis not present

## 2016-04-25 DIAGNOSIS — L98499 Non-pressure chronic ulcer of skin of other sites with unspecified severity: Secondary | ICD-10-CM | POA: Diagnosis not present

## 2016-04-25 DIAGNOSIS — T8189XD Other complications of procedures, not elsewhere classified, subsequent encounter: Secondary | ICD-10-CM | POA: Diagnosis not present

## 2016-04-28 DIAGNOSIS — A419 Sepsis, unspecified organism: Secondary | ICD-10-CM | POA: Diagnosis not present

## 2016-05-02 DIAGNOSIS — L98499 Non-pressure chronic ulcer of skin of other sites with unspecified severity: Secondary | ICD-10-CM | POA: Diagnosis not present

## 2016-05-09 DIAGNOSIS — L98499 Non-pressure chronic ulcer of skin of other sites with unspecified severity: Secondary | ICD-10-CM | POA: Diagnosis not present

## 2016-05-16 DIAGNOSIS — L98499 Non-pressure chronic ulcer of skin of other sites with unspecified severity: Secondary | ICD-10-CM | POA: Diagnosis not present

## 2016-05-20 DIAGNOSIS — G4700 Insomnia, unspecified: Secondary | ICD-10-CM

## 2016-05-20 HISTORY — DX: Insomnia, unspecified: G47.00

## 2016-05-20 NOTE — Progress Notes (Signed)
Location:   [facility]    Place of Service:  SNF (31)   CODE STATUS: full code  Allergies  Allergen Reactions  . Tape Other (See Comments)    SKIN IS VERY THIN; IT BRUISES AND TEARS EASILY!! -THX    Chief Complaint  Patient presents with  . Acute Visit    patient concerns     HPI:  She tells me that she is no longer itching. She does complain of insomnia and constipation. We did discuss her treatment options. She wants to restart her restoril; and will make her miralax daily on a routine basis. She is a poor historian and is unable to fully participate in the hpi or ros.    Past Medical History:  Diagnosis Date  . Anemia   . Arthritis   . CHF (congestive heart failure) ([place])   . Depression   . GERD (gastroesophageal reflux disease)   . Hyponatremia 01/2016  . Perforated chronic gastric ulcer ([place]) 08/11/2014  . Pneumonia   . Pulmonary arterial hypertension 11/28/2014    Past Surgical History:  Procedure Laterality Date  . ABDOMINAL HYSTERECTOMY    . ABDOMINAL SURGERY    . BACK SURGERY  2000  . COLON SURGERY     x2-blockage  . COLONOSCOPY    . ERCP    . HERNIA REPAIR  2012   x2 with mesh  . JOINT REPLACEMENT Left    shoulder  . LAPAROTOMY N/A 08/04/2014   Procedure: EXPLORATORY LAPAROTOMY, REPAIR OF PERFORATED ULCER;  Surgeon: Excell Seltzer, MD;  Location: [facility];  Service: General;  Laterality: N/A;  . LAPAROTOMY N/A 12/21/2015   Procedure: EXPLORATORY LAPAROTOMY/ REMOVAL OF MESH;  Surgeon: Coralie Keens, MD;  Location: [place] [facility];  Service: General;  Laterality: N/A;  . ORIF WRIST FRACTURE  2012   left  . REVERSE SHOULDER ARTHROPLASTY Right 12/04/2013  . REVERSE SHOULDER ARTHROPLASTY  12/04/2013   Procedure: REVERSE SHOULDER ARTHROPLASTY;  Surgeon: Nita Sells, MD;  Location: [facility] OR;  Service: Orthopedics;;  Right reverse total shoulder arthroplasty  . ROTATOR CUFF REPAIR     dr Tamera Punt  . SHOULDER ARTHROSCOPY  2012   lt and rt  . TENDON  REPAIR Right 04/13/2014   Procedure: RIGHT HAND CENTRAL TENDON CENTRALIZATION OF LONG AND RING FINGERS;  Surgeon: Jolyn Nap, MD;  Location: [place];  Service: Orthopedics;  Laterality: Right;    Social History   Social History  . Marital status: Married    Spouse name: N/A  . Number of children: N/A  . Years of education: N/A   Occupational History  . Not on file.   Social History Main Topics  . Smoking status: Never Smoker  . Smokeless tobacco: Never Used  . Alcohol use 4.2 oz/week    7 Glasses of wine per week     Comment: 8 oz -12 oz of wine every   . Drug use: No  . Sexual activity: Not on file   Other Topics Concern  . Not on file   Social History Narrative  . No narrative on file   Family History  Problem Relation Age of Onset  . Stroke Mother   . Breast cancer Sister   . Ulcerative colitis Daughter   . Chronic fatigue Daughter   . Chronic fatigue Daughter       VITAL SIGNS BP 103/61   Pulse 79   Temp 99.7 F (37.6 C)   Resp 18   Ht  '5\' 3"'  (1.6 m)   Wt 169 lb 9.6 oz (76.9 kg)   SpO2 98%   BMI 30.04 kg/m   Patient's Medications  New Prescriptions   No medications on file  Previous Medications   ACETAMINOPHEN (TYLENOL) 325 MG TABLET    Take 325 mg by mouth every 6 (six) hours as needed for mild pain.    ALUM & MAG HYDROXIDE-SIMETH (MAALOX/MYLANTA) 200-200-20 MG/5ML SUSPENSION    Take 15 mLs by mouth as needed for indigestion or heartburn.   AMINO ACIDS-PROTEIN HYDROLYS (FEEDING SUPPLEMENT, PRO-STAT SUGAR FREE 64,) LIQD    Take 30 mLs by mouth 3 (three) times daily with meals.   B COMPLEX VITAMINS TABLET    Take 1 tablet by mouth daily as needed (for supplement).    BISACODYL (DULCOLAX) 10 MG SUPPOSITORY    Place 1 suppository (10 mg total) rectally daily as needed for moderate constipation (May repeat times one).   BUDESONIDE (RHINOCORT AQUA) 32 MCG/ACT NASAL SPRAY    Place 1 spray into both nostrils daily as needed for  rhinitis.   CHOLECALCIFEROL (VITAMIN D) 2000 UNITS TABLET    Take 2,000 Units by mouth daily.    DICLOFENAC SODIUM (VOLTAREN) 1 % GEL    Apply 1 g topically 2 (two) times daily. Apply to bilateral knees for pain   DULOXETINE (CYMBALTA) 60 MG CAPSULE    Take 60 mg by mouth at bedtime.    ESOMEPRAZOLE MAGNESIUM (NEXIUM 24HR PO)    Take 20 mg by mouth daily.    GABAPENTIN (NEURONTIN) 300 MG CAPSULE    Take 300 mg by mouth 2 (two) times daily.   LACTOSE FREE NUTRITION (BOOST PLUS) LIQD    Take 237 mLs by mouth 2 (two) times daily between meals.   LIDOCAINE (LIDODERM) 5 %    Place 1 patch onto the skin daily. Remove & Discard patch within 12 hours or as directed by MD   MAGNESIUM 400 MG TABS    Take 400 mg by mouth daily.   MAGNESIUM HYDROXIDE (MILK OF MAGNESIA) 400 MG/5ML SUSPENSION    Take 30 mLs by mouth every other day. FOR CONSTIPATION   METHADONE (DOLOPHINE) 5 MG TABLET    Take one tablet by mouth twice daily for pain   METHOCARBAMOL (ROBAXIN) 500 MG TABLET    Take 500 mg by mouth 3 (three) times daily.   MULTIPLE VITAMIN (MULTIVITAMIN) TABLET    Take 1 tablet by mouth daily.   OMEGA-3 FATTY ACIDS (FISH OIL) 1000 MG CPDR    Take 1,000 mg by mouth daily.    OXYCODONE (OXY IR/ROXICODONE) 5 MG IMMEDIATE RELEASE TABLET    Take 1-2 tablets (5-10 mg total) by mouth every 8 (eight) hours as needed. For pain   OXYGEN    Inhale into the lungs. 2-6 L/min to keep stat at 90 or above   POTASSIUM CHLORIDE (KLOR-CON) 20 MEQ PACKET    Take 20 mEq by mouth daily.   SENNA (SENOKOT) 8.6 MG TABS TABLET    Take 2 tablets (17.2 mg total) by mouth at bedtime.   TIMOLOL (BETIMOL) 0.5 % OPHTHALMIC SOLUTION    Place 1 drop into both eyes 2 (two) times daily.    TORSEMIDE (DEMADEX) 20 MG TABLET    Take 40 mg by mouth daily as needed.   Modified Medications   No medications on file  Discontinued Medications   No medications on file     SIGNIFICANT DIAGNOSTIC EXAMS   01-20-16; chest x-ray: 1.  Cardiomegaly and mild  interstitial edema. 2. Improved aeration.  01-21-16: ct of abdomen and pelvis: 1. Apparent wall thickening at the rectum could reflect mild proctitis. 2. Small bilateral pleural effusions noted. Bibasilar airspace opacities may reflect atelectasis or possibly infection. 3. Large right-sided hiatal hernia, containing most of the stomach. 4. Mild soft tissue inflammation suggested about the bladder. Would correlate for any evidence of cystitis. 5. Nonspecific soft tissue inflammation suggested about the transverse colon. 6. Mild inflammation about the gallbladder is nonspecific, given the underlying diffuse soft tissue inflammation about the abdomen. 7. Very large anterior abdominal wall defect again noted, with packing material and underlying soft tissue thickening. 8. Scattered diverticulosis along the distal sigmoid colon, without evidence of diverticulitis. 9. Scattered aortic atherosclerosis noted. 10. Right convex thoracolumbar scoliosis noted.   01-21-16 ct of neck: 1. Findings consistent with right parotiditis. No abscess identified. No significant lymphadenopathy. 2. Partially visualized small right pleural effusion and dependent right upper lobe opacity which may represent associated pneumonia or atelectasis.  LABS REVIEWED:   12-27-15: pre-albumin 6.9 01-20-16; wbc 13.8; hgb 10.2; hct 32.6; mcv 95.0; plt 447; gluocose 109; bun 14; creat 0.62; k+ 4.7; na++ 131; alk phos 130; albumin 1.9 01-23-16: wbc 15.1; hgb 8.7; hct 27.2; mcv 93.6; plt 381; glucose 92; bun 8; creat 0.62; k+ 3.8; na++ 134 02-15-16: wbc 9.5; hgb 9.9; hct 31; plt 379; glucose 110; bun 9; creat 0.6; k+ 4.3; na++ 135      Review of Systems  Unable to perform ROS: Other  poor historian    Physical Exam  Constitutional: No distress.  Frail   Eyes: Conjunctivae are normal.  Neck: Neck supple. No JVD present. No thyromegaly present.  Cardiovascular: Normal rate, regular rhythm and intact distal pulses.   Murmur  heard. Respiratory: Effort normal and breath sounds normal. No respiratory distress. She has no wheezes.  GI: Soft. Bowel sounds are normal. She exhibits no distension. There is no tenderness.  Abdominal wound: 7.8 x 6.5 x 1.5cm wound vac in place  Musculoskeletal: She exhibits no edema.  Able to move all extremities   Lymphadenopathy:    She has no cervical adenopathy.  Neurological: She is alert.  Skin: Skin is warm and dry. She is not diaphoretic.  Psychiatric: She has a normal mood and affect.    ASSESSMENT/ PLAN:  1. Chronic pain syndrome: has chronic bilateral low back pain: is on chronic narcotic therapy: will continue methadone 5 mg twice daily robaxin 500 mg three times daily neurontin 300 mg twice daily  oxycodone 5 or 10 mg every 8 hours as needed    Will begin benadryl 12.5 mg every 6 hours as needed for itching.   2. Constipation: will continue senna 2 tabs nightly    Will change miralax to 17 gm daily    3. Status post laparotomy with removal of mesh: has large abdominal wound: will continue wound vac and will monitor her status   4. Insomnia: will begin restoril 7.5 mg nightly and will monitor   MD is aware of resident's narcotic use and is in agreement with current plan of care. We will attempt to wean resident as apropriate   Ok Edwards NP [facility]  Contact [phone] Monday through Friday 8am- 5pm  After hours call [phone]

## 2016-05-23 ENCOUNTER — Non-Acute Institutional Stay (SKILLED_NURSING_FACILITY): Payer: PPO | Admitting: Adult Health

## 2016-05-23 ENCOUNTER — Encounter: Payer: Self-pay | Admitting: Adult Health

## 2016-05-23 DIAGNOSIS — M545 Low back pain: Secondary | ICD-10-CM

## 2016-05-23 DIAGNOSIS — K219 Gastro-esophageal reflux disease without esophagitis: Secondary | ICD-10-CM | POA: Diagnosis not present

## 2016-05-23 DIAGNOSIS — T8189XD Other complications of procedures, not elsewhere classified, subsequent encounter: Secondary | ICD-10-CM | POA: Diagnosis not present

## 2016-05-23 DIAGNOSIS — L98499 Non-pressure chronic ulcer of skin of other sites with unspecified severity: Secondary | ICD-10-CM | POA: Diagnosis not present

## 2016-05-23 DIAGNOSIS — F1190 Opioid use, unspecified, uncomplicated: Secondary | ICD-10-CM

## 2016-05-23 DIAGNOSIS — K5903 Drug induced constipation: Secondary | ICD-10-CM

## 2016-05-23 DIAGNOSIS — F329 Major depressive disorder, single episode, unspecified: Secondary | ICD-10-CM

## 2016-05-23 DIAGNOSIS — G894 Chronic pain syndrome: Secondary | ICD-10-CM | POA: Diagnosis not present

## 2016-05-23 DIAGNOSIS — G8929 Other chronic pain: Secondary | ICD-10-CM

## 2016-05-23 DIAGNOSIS — I82413 Acute embolism and thrombosis of femoral vein, bilateral: Secondary | ICD-10-CM | POA: Diagnosis not present

## 2016-05-23 DIAGNOSIS — T402X5A Adverse effect of other opioids, initial encounter: Secondary | ICD-10-CM | POA: Diagnosis not present

## 2016-05-23 DIAGNOSIS — F32A Depression, unspecified: Secondary | ICD-10-CM

## 2016-05-23 NOTE — Progress Notes (Signed)
Location:   [facility] Room Number: 105 B Place of Service:  SNF (31)   CODE STATUS: Full Code  Allergies  Allergen Reactions  . Tape Other (See Comments)    SKIN IS VERY THIN; IT BRUISES AND TEARS EASILY!! -THX    Chief Complaint  Patient presents with  . Medical Management of Chronic Issues    Routine Visit    HPI:  She is a long term resident of this facility being seen for the management of her chronic illnesses. Overall her status is stable. She does have edema present. She has a history of chf heart failure and is on lasix three times weekly. She is complaining of allergic rhinitis and sore throat. There are no nursing concerns at this time.    Past Medical History:  Diagnosis Date  . Anemia   . Arthritis   . CHF (congestive heart failure) ([place])   . Depression   . GERD (gastroesophageal reflux disease)   . Hyponatremia 01/2016  . Perforated chronic gastric ulcer ([place]) 08/11/2014  . Pneumonia   . Pulmonary arterial hypertension 11/28/2014    Past Surgical History:  Procedure Laterality Date  . ABDOMINAL HYSTERECTOMY    . ABDOMINAL SURGERY    . BACK SURGERY  2000  . COLON SURGERY     x2-blockage  . COLONOSCOPY    . ERCP    . HERNIA REPAIR  2012   x2 with mesh  . JOINT REPLACEMENT Left    shoulder  . LAPAROTOMY N/A 08/04/2014   Procedure: EXPLORATORY LAPAROTOMY, REPAIR OF PERFORATED ULCER;  Surgeon: Excell Seltzer, MD;  [facility];  Service: General;  Laterality: N/A;  . LAPAROTOMY N/A 12/21/2015   Procedure: EXPLORATORY LAPAROTOMY/ REMOVAL OF MESH;  Surgeon: Coralie Keens, MD;  Location: [facility];  Service: General;  Laterality: N/A;  . ORIF WRIST FRACTURE  2012   left  . REVERSE SHOULDER ARTHROPLASTY Right 12/04/2013  . REVERSE SHOULDER ARTHROPLASTY  12/04/2013   Procedure: REVERSE SHOULDER ARTHROPLASTY;  Surgeon: Nita Sells, MD;  Location: [facility] OR;  Service: Orthopedics;;  Right reverse total shoulder arthroplasty  .  ROTATOR CUFF REPAIR     dr Tamera Punt  . SHOULDER ARTHROSCOPY  2012   lt and rt  . TENDON REPAIR Right 04/13/2014   Procedure: RIGHT HAND CENTRAL TENDON CENTRALIZATION OF LONG AND RING FINGERS;  Surgeon: Jolyn Nap, MD;  Location: [place];  Service: Orthopedics;  Laterality: Right;    Social History   Social History  . Marital status: Married    Spouse name: N/A  . Number of children: N/A  . Years of education: N/A   Occupational History  . Not on file.   Social History Main Topics  . Smoking status: Never Smoker  . Smokeless tobacco: Never Used  . Alcohol use 4.2 oz/week    7 Glasses of wine per week     Comment: 8 oz -12 oz of wine every   . Drug use: No  . Sexual activity: Not on file   Other Topics Concern  . Not on file   Social History Narrative  . No narrative on file   Family History  Problem Relation Age of Onset  . Stroke Mother   . Breast cancer Sister   . Ulcerative colitis Daughter   . Chronic fatigue Daughter   . Chronic fatigue Daughter       VITAL SIGNS BP 118/70   Pulse 74   Temp 98.1 F (  36.7 C)   Resp 20   Ht _0  (1.6 m)   Wt 158 lb 9.6 oz (71.9 kg)   SpO2 97%   BMI 28.09 kg/m   Patient's Medications  New Prescriptions   No medications on file  Previous Medications   ACETAMINOPHEN (TYLENOL) 325 MG TABLET    Take 325 mg by mouth every 6 (six) hours as needed for mild pain.    ALUM & MAG HYDROXIDE-SIMETH (MAALOX/MYLANTA) 200-200-20 MG/5ML SUSPENSION    Take 15 mLs by mouth as needed for indigestion or heartburn.   AMINO ACIDS-PROTEIN HYDROLYS (FEEDING SUPPLEMENT, PRO-STAT SUGAR FREE 64,) LIQD    Take 30 mLs by mouth 3 (three) times daily with meals.   B COMPLEX VITAMINS TABLET    Take 1 tablet by mouth daily as needed (for supplement).    BISACODYL (DULCOLAX) 10 MG SUPPOSITORY    Place 1 suppository (10 mg total) rectally daily as needed for moderate constipation (May repeat times one).   BUDESONIDE (RHINOCORT  AQUA) 32 MCG/ACT NASAL SPRAY    Place 1 spray into both nostrils daily as needed for rhinitis.   CHOLECALCIFEROL (VITAMIN D) 2000 UNITS TABLET    Take 2,000 Units by mouth daily.    DICLOFENAC SODIUM (VOLTAREN) 1 % GEL    Apply 1 g topically 2 (two) times daily. Apply to bilateral knees for pain   DIPHENHYDRAMINE HCL (BENADRYL PO)    Take by mouth. Take 0.5 tablet every 6 hours as needed for itching   DULOXETINE (CYMBALTA) 60 MG CAPSULE    Take 60 mg by mouth at bedtime.    ESOMEPRAZOLE MAGNESIUM (NEXIUM 24HR PO)    Take 20 mg by mouth daily.    GABAPENTIN (NEURONTIN) 300 MG CAPSULE    Take 300 mg by mouth 2 (two) times daily.   LIDOCAINE (LIDODERM) 5 %    Place 1 patch onto the skin daily. Remove & Discard patch within 12 hours or as directed by MD   MAGNESIUM 400 MG TABS    Take 400 mg by mouth daily.   MAGNESIUM HYDROXIDE (MILK OF MAGNESIA) 400 MG/5ML SUSPENSION    Take 30 mLs by mouth every other day. FOR CONSTIPATION   METHADONE (DOLOPHINE) 5 MG TABLET    Take one tablet by mouth twice daily for pain   METHOCARBAMOL (ROBAXIN) 500 MG TABLET    Take 500 mg by mouth 3 (three) times daily.   MULTIPLE VITAMIN (MULTIVITAMIN) TABLET    Take 1 tablet by mouth daily.   OMEGA-3 FATTY ACIDS (FISH OIL) 1000 MG CPDR    Take 1,000 mg by mouth daily.    OXYCODONE (OXY IR/ROXICODONE) 5 MG IMMEDIATE RELEASE TABLET    Take 1-2 tablets (5-10 mg total) by mouth every 8 (eight) hours as needed. For pain   OXYGEN    Inhale into the lungs. 2-6 L/min to keep stat at 90 or above   POLYETHYLENE GLYCOL (MIRALAX / GLYCOLAX) PACKET    Take 17 g by mouth daily.    POTASSIUM CHLORIDE (KLOR-CON) 20 MEQ PACKET    Take 20 mEq by mouth daily.   RIVAROXABAN (XARELTO) 20 MG TABS TABLET    Take 20 mg by mouth at bedtime.   SENNA (SENOKOT) 8.6 MG TABS TABLET    Take 2 tablets (17.2 mg total) by mouth at bedtime.   TEMAZEPAM (RESTORIL) 15 MG CAPSULE    Take 15 mg by mouth at bedtime.    TIMOLOL (BETIMOL) 0.5 % OPHTHALMIC SOLUTION  Place 1 drop into both eyes 2 (two) times daily.    TORSEMIDE (DEMADEX) 20 MG TABLET    Take 40 mg by mouth one time a day on Monday, Wednesday and Friday   UNABLE TO FIND    House Supplement - Take 4 oz by mouth 2 times daily  Modified Medications   No medications on file  Discontinued Medications   LACTOSE FREE NUTRITION (BOOST PLUS) LIQD    Take 237 mLs by mouth 2 (two) times daily between meals.     SIGNIFICANT DIAGNOSTIC EXAMS  01-20-16; chest x-ray: 1. Cardiomegaly and mild interstitial edema. 2. Improved aeration.  01-21-16: ct of abdomen and pelvis: 1. Apparent wall thickening at the rectum could reflect mild proctitis. 2. Small bilateral pleural effusions noted. Bibasilar airspace opacities may reflect atelectasis or possibly infection. 3. Large right-sided hiatal hernia, containing most of the stomach. 4. Mild soft tissue inflammation suggested about the bladder. Would correlate for any evidence of cystitis. 5. Nonspecific soft tissue inflammation suggested about the transverse colon. 6. Mild inflammation about the gallbladder is nonspecific, given the underlying diffuse soft tissue inflammation about the abdomen. 7. Very large anterior abdominal wall defect again noted, with packing material and underlying soft tissue thickening. 8. Scattered diverticulosis along the distal sigmoid colon, without evidence of diverticulitis. 9. Scattered aortic atherosclerosis noted. 10. Right convex thoracolumbar scoliosis noted.   01-21-16 ct of neck: 1. Findings consistent with right parotiditis. No abscess identified. No significant lymphadenopathy. 2. Partially visualized small right pleural effusion and dependent right upper lobe opacity which may represent associated pneumonia or atelectasis.  LABS REVIEWED:   12-27-15: pre-albumin 6.9 01-20-16; wbc 13.8; hgb 10.2; hct 32.6; mcv 95.0; plt 447; gluocose 109; bun 14; creat 0.62; k+ 4.7; na++ 131; alk phos 130; albumin 1.9 01-23-16: wbc  15.1; hgb 8.7; hct 27.2; mcv 93.6; plt 381; glucose 92; bun 8; creat 0.62; k+ 3.8; na++ 134 02-15-16: wbc 9.5; hgb 9.9; hct 31; plt 379; glucose 110; bun 9; creat 0.6; k+ 4.3; na++ 135      Review of Systems  Unable to perform ROS: Other  poor historian    Physical Exam  Constitutional: No distress.  Frail   Eyes: Conjunctivae are normal.  Neck: Neck supple. No JVD present. No thyromegaly present.  Cardiovascular: Normal rate, regular rhythm and intact distal pulses.   Murmur heard. Respiratory: Effort normal and breath sounds normal. No respiratory distress. She has no wheezes.  GI: Soft. Bowel sounds are normal. She exhibits no distension. There is no tenderness.  Abdominal wound: 7.9 x 11.2 x 1.4 cm  Musculoskeletal: She exhibits lower extremity edema   Able to move all extremities   Lymphadenopathy:    She has no cervical adenopathy.  Neurological: She is alert.  Skin: Skin is warm and dry. She is not diaphoretic.  Psychiatric: She has a normal mood and affect.    ASSESSMENT/ PLAN:  1. Chronic pain syndrome: has chronic bilateral low back pain: is on chronic narcotic therapy: will continue methadone 5 mg twice daily robaxin 500 mg three times daily neurontin 300 mg twice daily  lidoderm patch; and voltaren gel 1 gm twice daily to knees oxycodone 5 or 10 mg every 8 hours as needed    Has  benadryl 12.5 mg every 6 hours as needed for itching.   2. Constipation: will continue senna 2 tabs nightly   miralax 17 gm  daily    3. Status post laparotomy with removal of mesh: has large  abdominal wound: will continue current treatment is followed by wound doctor   4. Insomnia: will continue restoril 15 mg nightly   5. Bilateral DVT: femoral veins: will continue xarelto 20 mg daily   6. CHF: will change to demadex 40 mg daily with k+ 20 meq daily is on 02   7. gerd: will continue nexium 20 mg daily   8. Depression with anxiety: will continue cymbalta 60 mg daily   9. Glaucoma:  will continue timolol to both eyes twice daily   10. Allergic rhinits: will change to rhinocort daily will begin mucinex twice daily   Will change her mvi with iron to reduce pill burden In one week will check prealbumin bmp; ionized calcium   MD is aware of resident's narcotic use and is in agreement with current plan of care. We will attempt to wean resident as apropriate     Ok Edwards NP [facility]  Contact [phone] Monday through Friday 8am- 5pm  After hours call [phone]

## 2016-05-29 DIAGNOSIS — T8189XD Other complications of procedures, not elsewhere classified, subsequent encounter: Secondary | ICD-10-CM | POA: Insufficient documentation

## 2016-05-29 DIAGNOSIS — I82413 Acute embolism and thrombosis of femoral vein, bilateral: Secondary | ICD-10-CM | POA: Insufficient documentation

## 2016-05-29 NOTE — Progress Notes (Signed)
Location:   [facility]    Place of Service:  SNF (31)   CODE STATUS: full code   Allergies  Allergen Reactions  . Tape Other (See Comments)    SKIN IS VERY THIN; IT BRUISES AND TEARS EASILY!! -THX    Chief Complaint  Patient presents with  . Acute Visit    follow up study results     HPI:  Her doppler study was positive for dvt. She has seen the wound doctor today; who feels as though she needs to return back to her surgeon. He tells me that her abdominal wound will not heal with the mesh in place. I have spoken with Dr. [name] regarding the results of her doppler results; and will need to get a 2-d echo.   Past Medical History:  Diagnosis Date  . Anemia   . Arthritis   . CHF (congestive heart failure) ([place])   . Depression   . GERD (gastroesophageal reflux disease)   . Hyponatremia 01/2016  . Perforated chronic gastric ulcer ([place]) 08/11/2014  . Pneumonia   . Pulmonary arterial hypertension 11/28/2014    Past Surgical History:  Procedure Laterality Date  . ABDOMINAL HYSTERECTOMY    . ABDOMINAL SURGERY    . BACK SURGERY  2000  . COLON SURGERY     x2-blockage  . COLONOSCOPY    . ERCP    . HERNIA REPAIR  2012   x2 with mesh  . JOINT REPLACEMENT Left    shoulder  . LAPAROTOMY N/A 08/04/2014   Procedure: EXPLORATORY LAPAROTOMY, REPAIR OF PERFORATED ULCER;  Surgeon: Excell Seltzer, MD;  Location: [facility];  Service: General;  Laterality: N/A;  . LAPAROTOMY N/A 12/21/2015   Procedure: EXPLORATORY LAPAROTOMY/ REMOVAL OF MESH;  Surgeon: Coralie Keens, MD;  Location: [place];  Service: General;  Laterality: N/A;  . ORIF WRIST FRACTURE  2012   left  . REVERSE SHOULDER ARTHROPLASTY Right 12/04/2013  . REVERSE SHOULDER ARTHROPLASTY  12/04/2013   Procedure: REVERSE SHOULDER ARTHROPLASTY;  Surgeon: Nita Sells, MD;  Location: [facility] OR;  Service: Orthopedics;;  Right reverse total shoulder arthroplasty  . ROTATOR CUFF REPAIR     dr Tamera Punt  . SHOULDER ARTHROSCOPY   2012   lt and rt  . TENDON REPAIR Right 04/13/2014   Procedure: RIGHT HAND CENTRAL TENDON CENTRALIZATION OF LONG AND RING FINGERS;  Surgeon: Jolyn Nap, MD;  Location: [place];  Service: Orthopedics;  Laterality: Right;    Social History   Social History  . Marital status: Married    Spouse name: N/A  . Number of children: N/A  . Years of education: N/A   Occupational History  . Not on file.   Social History Main Topics  . Smoking status: Never Smoker  . Smokeless tobacco: Never Used  . Alcohol use 4.2 oz/week    7 Glasses of wine per week     Comment: 8 oz -12 oz of wine every   . Drug use: No  . Sexual activity: Not on file   Other Topics Concern  . Not on file   Social History Narrative  . No narrative on file   Family History  Problem Relation Age of Onset  . Stroke Mother   . Breast cancer Sister   . Ulcerative colitis Daughter   . Chronic fatigue Daughter   . Chronic fatigue Daughter       VITAL SIGNS BP 108/64   Pulse 72   Temp 98.6 F (  37 C)   Resp 18   Ht '5\' 3"'$  (1.6 m)   Wt 169 lb 9.6 oz (76.9 kg)   SpO2 97%   BMI 30.04 kg/m   Patient's Medications  New Prescriptions   No medications on file  Previous Medications   ACETAMINOPHEN (TYLENOL) 325 MG TABLET    Take 325 mg by mouth every 6 (six) hours as needed for mild pain.    ALUM & MAG HYDROXIDE-SIMETH (MAALOX/MYLANTA) 200-200-20 MG/5ML SUSPENSION    Take 15 mLs by mouth as needed for indigestion or heartburn.   AMINO ACIDS-PROTEIN HYDROLYS (FEEDING SUPPLEMENT, PRO-STAT SUGAR FREE 64,) LIQD    Take 30 mLs by mouth 3 (three) times daily with meals.   B COMPLEX VITAMINS TABLET    Take 1 tablet by mouth daily as needed (for supplement).    BISACODYL (DULCOLAX) 10 MG SUPPOSITORY    Place 1 suppository (10 mg total) rectally daily as needed for moderate constipation (May repeat times one).   BUDESONIDE (RHINOCORT AQUA) 32 MCG/ACT NASAL SPRAY    Place 1 spray into both nostrils  daily as needed for rhinitis.   CHOLECALCIFEROL (VITAMIN D) 2000 UNITS TABLET    Take 2,000 Units by mouth daily.    DICLOFENAC SODIUM (VOLTAREN) 1 % GEL    Apply 1 g topically 2 (two) times daily. Apply to bilateral knees for pain   DIPHENHYDRAMINE HCL (BENADRYL PO)    Take by mouth. Take 0.5 tablet every 6 hours as needed for itching   DULOXETINE (CYMBALTA) 60 MG CAPSULE    Take 60 mg by mouth at bedtime.    ESOMEPRAZOLE MAGNESIUM (NEXIUM 24HR PO)    Take 20 mg by mouth daily.    GABAPENTIN (NEURONTIN) 300 MG CAPSULE    Take 300 mg by mouth 2 (two) times daily.   LIDOCAINE (LIDODERM) 5 %    Place 1 patch onto the skin daily. Remove & Discard patch within 12 hours or as directed by MD   MAGNESIUM 400 MG TABS    Take 400 mg by mouth daily.   MAGNESIUM HYDROXIDE (MILK OF MAGNESIA) 400 MG/5ML SUSPENSION    Take 30 mLs by mouth every other day. FOR CONSTIPATION   METHADONE (DOLOPHINE) 5 MG TABLET    Take one tablet by mouth twice daily for pain   METHOCARBAMOL (ROBAXIN) 500 MG TABLET    Take 500 mg by mouth 3 (three) times daily.   MULTIPLE VITAMIN (MULTIVITAMIN) TABLET    Take 1 tablet by mouth daily.   OMEGA-3 FATTY ACIDS (FISH OIL) 1000 MG CPDR    Take 1,000 mg by mouth daily.    OXYCODONE (OXY IR/ROXICODONE) 5 MG IMMEDIATE RELEASE TABLET    Take 1-2 tablets (5-10 mg total) by mouth every 8 (eight) hours as needed. For pain   OXYGEN    Inhale into the lungs. 2-6 L/min to keep stat at 90 or above   POLYETHYLENE GLYCOL (MIRALAX / GLYCOLAX) PACKET    Take 17 g by mouth daily.    POTASSIUM CHLORIDE (KLOR-CON) 20 MEQ PACKET    Take 20 mEq by mouth daily.   SENNA (SENOKOT) 8.6 MG TABS TABLET    Take 2 tablets (17.2 mg total) by mouth at bedtime.   TEMAZEPAM (RESTORIL) 15 MG CAPSULE    Take 15 mg by mouth at bedtime.    TIMOLOL (BETIMOL) 0.5 % OPHTHALMIC SOLUTION    Place 1 drop into both eyes 2 (two) times daily.    TORSEMIDE (DEMADEX) 20  MG TABLET    Take 40 mg by mouth one time a day on Monday,  Wednesday and Friday   UNABLE TO FIND    House Supplement - Take 4 oz by mouth 2 times daily  Modified Medications   No medications on file  Discontinued Medications   No medications on file     SIGNIFICANT DIAGNOSTIC EXAMS  01-20-16; chest x-ray: 1. Cardiomegaly and mild interstitial edema. 2. Improved aeration.  01-21-16: ct of abdomen and pelvis: 1. Apparent wall thickening at the rectum could reflect mild proctitis. 2. Small bilateral pleural effusions noted. Bibasilar airspace opacities may reflect atelectasis or possibly infection. 3. Large right-sided hiatal hernia, containing most of the stomach. 4. Mild soft tissue inflammation suggested about the bladder. Would correlate for any evidence of cystitis. 5. Nonspecific soft tissue inflammation suggested about the transverse colon. 6. Mild inflammation about the gallbladder is nonspecific, given the underlying diffuse soft tissue inflammation about the abdomen. 7. Very large anterior abdominal wall defect again noted, with packing material and underlying soft tissue thickening. 8. Scattered diverticulosis along the distal sigmoid colon, without evidence of diverticulitis. 9. Scattered aortic atherosclerosis noted. 10. Right convex thoracolumbar scoliosis noted.   01-21-16 ct of neck: 1. Findings consistent with right parotiditis. No abscess identified. No significant lymphadenopathy. 2. Partially visualized small right pleural effusion and dependent right upper lobe opacity which may represent associated pneumonia or atelectasis.  04-25-16: chest x-ray: slight right lower lobe infiltrate  04-25-16: right lower extremity doppler: thrombus and non-compressibility within common femoral vein on the right and within the common femoral vein through popliteal on left.      LABS REVIEWED:   12-27-15: pre-albumin 6.9 01-20-16; wbc 13.8; hgb 10.2; hct 32.6; mcv 95.0; plt 447; gluocose 109; bun 14; creat 0.62; k+ 4.7; na++ 131; alk phos 130;  albumin 1.9 01-23-16: wbc 15.1; hgb 8.7; hct 27.2; mcv 93.6; plt 381; glucose 92; bun 8; creat 0.62; k+ 3.8; na++ 134 02-15-16: wbc 9.5; hgb 9.9; hct 31; plt 379; glucose 110; bun 9; creat 0.6; k+ 4.3; na++ 135  04-25-16: glucose 102; bun 13; creat 0.68; k+ 3.9; na++ 135; liver normal albumin 2.7 BNP 18      Review of Systems  Unable to perform ROS: Other  poor historian    Physical Exam  Constitutional: No distress.  Frail   Eyes: Conjunctivae are normal.  Neck: Neck supple. No JVD present. No thyromegaly present.  Cardiovascular: Normal rate, regular rhythm and intact distal pulses.   Murmur heard. Respiratory: Effort normal and breath sounds normal. No respiratory distress. She has no wheezes.  GI: Soft. Bowel sounds are normal. She exhibits no distension. There is no tenderness.   Musculoskeletal: has bilateral lower extremity edema  Able to move all extremities   Lymphadenopathy:    She has no cervical adenopathy.  Neurological: She is alert.  Skin: Skin is warm and dry. She is not diaphoretic.  Abdominal surgical wound: 7.5 x 6.3 x 1.3 cm  Psychiatric: She has a normal mood and affect.     ASSESSMENT/ PLAN:  1. Bilateral lower extremity dvt: will begin xarelto 15 twice daily for 21 days then 20 mg daily will setup an 2-d echo.   2. Non-healing surgical wound: will continue treatment per wound dr and will have her return back to her surgeon.     MD is aware of resident's narcotic use and is in agreement with current plan of care. We will attempt to wean resident as apropriate  Ok Edwards NP [facility]  Contact [phone] Monday through Friday 8am- 5pm  After hours call [phone]

## 2016-05-30 DIAGNOSIS — L98499 Non-pressure chronic ulcer of skin of other sites with unspecified severity: Secondary | ICD-10-CM | POA: Diagnosis not present

## 2016-05-30 DIAGNOSIS — R5383 Other fatigue: Secondary | ICD-10-CM | POA: Diagnosis not present

## 2016-05-30 DIAGNOSIS — Z79899 Other long term (current) drug therapy: Secondary | ICD-10-CM | POA: Diagnosis not present

## 2016-05-30 LAB — BASIC METABOLIC PANEL
BUN: 12 mg/dL (ref 4–21)
BUN: 12 mg/dL (ref 4–21)
CREATININE: 0.6 mg/dL (ref <=1.1)
CREATININE: 0.6 mg/dL (ref 0.5–1.1)
GLUCOSE: 112 mg/dL
Glucose: 112 mg/dL
POTASSIUM: 4.3 mmol/L (ref 3.4–5.3)
POTASSIUM: 4.3 mmol/L (ref 3.4–5.3)
SODIUM: 145 mmol/L (ref 137–147)
Sodium: 145 mmol/L (ref 137–147)

## 2016-06-06 DIAGNOSIS — L98499 Non-pressure chronic ulcer of skin of other sites with unspecified severity: Secondary | ICD-10-CM | POA: Diagnosis not present

## 2016-06-10 DIAGNOSIS — F32A Depression, unspecified: Secondary | ICD-10-CM | POA: Insufficient documentation

## 2016-06-10 DIAGNOSIS — F329 Major depressive disorder, single episode, unspecified: Secondary | ICD-10-CM | POA: Insufficient documentation

## 2016-06-13 DIAGNOSIS — L98499 Non-pressure chronic ulcer of skin of other sites with unspecified severity: Secondary | ICD-10-CM | POA: Diagnosis not present

## 2016-06-20 DIAGNOSIS — L98492 Non-pressure chronic ulcer of skin of other sites with fat layer exposed: Secondary | ICD-10-CM | POA: Diagnosis not present

## 2016-06-23 ENCOUNTER — Non-Acute Institutional Stay (SKILLED_NURSING_FACILITY): Payer: PPO | Admitting: Adult Health

## 2016-06-23 ENCOUNTER — Encounter: Payer: Self-pay | Admitting: Adult Health

## 2016-06-23 DIAGNOSIS — I82413 Acute embolism and thrombosis of femoral vein, bilateral: Secondary | ICD-10-CM | POA: Diagnosis not present

## 2016-06-23 DIAGNOSIS — F1190 Opioid use, unspecified, uncomplicated: Secondary | ICD-10-CM | POA: Diagnosis not present

## 2016-06-23 DIAGNOSIS — T8189XD Other complications of procedures, not elsewhere classified, subsequent encounter: Secondary | ICD-10-CM

## 2016-06-23 DIAGNOSIS — E43 Unspecified severe protein-calorie malnutrition: Secondary | ICD-10-CM

## 2016-06-23 DIAGNOSIS — G8929 Other chronic pain: Secondary | ICD-10-CM | POA: Diagnosis not present

## 2016-06-23 DIAGNOSIS — I2721 Secondary pulmonary arterial hypertension: Secondary | ICD-10-CM

## 2016-06-23 DIAGNOSIS — G894 Chronic pain syndrome: Secondary | ICD-10-CM | POA: Diagnosis not present

## 2016-06-23 DIAGNOSIS — M545 Low back pain: Secondary | ICD-10-CM

## 2016-06-23 NOTE — Progress Notes (Signed)
Location:   [place] Room Number: 105A Place of Service:  SNF (31) Ok Edwards NP  CODE STATUS: Full Code  Allergies  Allergen Reactions  . Tape Other (See Comments)    SKIN IS VERY THIN; IT BRUISES AND TEARS EASILY!! -THX    Chief Complaint  Patient presents with  . Medical Management of Chronic Issues    Routine Visit    HPI:  She is a long term resident of this facility being seen for the management of her chronic illnesses. Her status remains without change. She does spend nearly of all her time in bed. She did tell me that she is not hurting at this time. There are no nursing concerns at this time.    Past Medical History:  Diagnosis Date  . Anemia   . Arthritis   . CHF (congestive heart failure) ([place])   . Depression   . GERD (gastroesophageal reflux disease)   . Hyponatremia 01/2016  . Perforated chronic gastric ulcer ([place]) 08/11/2014  . Pneumonia   . Pulmonary arterial hypertension 11/28/2014    Past Surgical History:  Procedure Laterality Date  . ABDOMINAL HYSTERECTOMY    . ABDOMINAL SURGERY    . BACK SURGERY  2000  . COLON SURGERY     x2-blockage  . COLONOSCOPY    . ERCP    . HERNIA REPAIR  2012   x2 with mesh  . JOINT REPLACEMENT Left    shoulder  . LAPAROTOMY N/A 08/04/2014   Procedure: EXPLORATORY LAPAROTOMY, REPAIR OF PERFORATED ULCER;  Surgeon: Excell Seltzer, MD;  Location: [facility];  Service: General;  Laterality: N/A;  . LAPAROTOMY N/A 12/21/2015   Procedure: EXPLORATORY LAPAROTOMY/ REMOVAL OF MESH;  Surgeon: Coralie Keens, MD;  Location: [place];  Service: General;  Laterality: N/A;  . ORIF WRIST FRACTURE  2012   left  . REVERSE SHOULDER ARTHROPLASTY Right 12/04/2013  . REVERSE SHOULDER ARTHROPLASTY  12/04/2013   Procedure: REVERSE SHOULDER ARTHROPLASTY;  Surgeon: Nita Sells, MD;  Location: [facility] OR;  Service: Orthopedics;;  Right reverse total shoulder arthroplasty  . ROTATOR CUFF REPAIR     dr Tamera Punt  .  SHOULDER ARTHROSCOPY  2012   lt and rt  . TENDON REPAIR Right 04/13/2014   Procedure: RIGHT HAND CENTRAL TENDON CENTRALIZATION OF LONG AND RING FINGERS;  Surgeon: Jolyn Nap, MD;  Location: [place];  Service: Orthopedics;  Laterality: Right;    Social History   Social History  . Marital status: Married    Spouse name: N/A  . Number of children: N/A  . Years of education: N/A   Occupational History  . Not on file.   Social History Main Topics  . Smoking status: Never Smoker  . Smokeless tobacco: Never Used  . Alcohol use 4.2 oz/week    7 Glasses of wine per week     Comment: 8 oz -12 oz of wine every   . Drug use: No  . Sexual activity: Not on file   Other Topics Concern  . Not on file   Social History Narrative  . No narrative on file   Family History  Problem Relation Age of Onset  . Stroke Mother   . Breast cancer Sister   . Ulcerative colitis Daughter   . Chronic fatigue Daughter   . Chronic fatigue Daughter       VITAL SIGNS BP (!) 103/48   Pulse 63   Temp 97.1 F (36.2 C)  Resp 16   Ht _0  (1.6 m)   Wt 158 lb 9.6 oz (71.9 kg)   SpO2 97%   BMI 28.09 kg/m   Patient's Medications  New Prescriptions   No medications on file  Previous Medications   ACETAMINOPHEN (TYLENOL) 325 MG TABLET    Take 325 mg by mouth every 6 (six) hours as needed for mild pain.    ALUM & MAG HYDROXIDE-SIMETH (MAALOX/MYLANTA) 200-200-20 MG/5ML SUSPENSION    Take 15 mLs by mouth as needed for indigestion or heartburn.   AMINO ACIDS-PROTEIN HYDROLYS (FEEDING SUPPLEMENT, PRO-STAT SUGAR FREE 64,) LIQD    Take 30 mLs by mouth 3 (three) times daily with meals.   BISACODYL (DULCOLAX) 10 MG SUPPOSITORY    Place 1 suppository (10 mg total) rectally daily as needed for moderate constipation (May repeat times one).   BUDESONIDE (RHINOCORT AQUA) 32 MCG/ACT NASAL SPRAY    Place 1 spray into both nostrils daily as needed for rhinitis.   DEXTROMETHORPHAN-GUAIFENESIN  (MUCINEX DM) 30-600 MG 12HR TABLET    Take 1 tablet by mouth daily.   DICLOFENAC SODIUM (VOLTAREN) 1 % GEL    Apply 1 g topically 2 (two) times daily. Apply to bilateral knees for pain   DIPHENHYDRAMINE HCL (BENADRYL PO)    Take by mouth. Take 0.5 tablet every 6 hours as needed for itching   DIVALPROEX (DEPAKOTE SPRINKLE) 125 MG CAPSULE    Take 250 mg by mouth at bedtime.   DULOXETINE (CYMBALTA) 60 MG CAPSULE    Take 60 mg by mouth at bedtime.    ESOMEPRAZOLE MAGNESIUM (NEXIUM 24HR PO)    Take 20 mg by mouth daily.    FLUTICASONE (FLONASE) 50 MCG/ACT NASAL SPRAY    Place 1 spray into both nostrils at bedtime.   GABAPENTIN (NEURONTIN) 300 MG CAPSULE    Take 300 mg by mouth 2 (two) times daily.   LIDOCAINE (LIDODERM) 5 %    Place 1 patch onto the skin daily. Remove & Discard patch within 12 hours or as directed by MD   MAGNESIUM HYDROXIDE (MILK OF MAGNESIA) 400 MG/5ML SUSPENSION    Take 30 mLs by mouth every other day. FOR CONSTIPATION   METHADONE (DOLOPHINE) 5 MG TABLET    Take one tablet by mouth twice daily for pain   METHOCARBAMOL (ROBAXIN) 500 MG TABLET    Take 500 mg by mouth 3 (three) times daily.   MULTIPLE VITAMIN (MULTIVITAMIN) TABLET    Take 1 tablet by mouth daily.   OMEGA-3 FATTY ACIDS (FISH OIL) 1000 MG CPDR    Take 1,000 mg by mouth daily.    OXYCODONE (OXY IR/ROXICODONE) 5 MG IMMEDIATE RELEASE TABLET    Take 1-2 tablets (5-10 mg total) by mouth every 8 (eight) hours as needed. For pain   OXYGEN    Inhale into the lungs. 2-6 L/min to keep stat at 90 or above   POLYETHYLENE GLYCOL (MIRALAX / GLYCOLAX) PACKET    Take 17 g by mouth daily.    POTASSIUM CHLORIDE (KLOR-CON) 20 MEQ PACKET    Take 20 mEq by mouth daily as needed. Take 1 tablet by mouth daily as needed only when Lasix is given   RIVAROXABAN (XARELTO) 20 MG TABS TABLET    Take 20 mg by mouth at bedtime.   SENNA (SENOKOT) 8.6 MG TABS TABLET    Take 2 tablets (17.2 mg total) by mouth at bedtime.   TEMAZEPAM (RESTORIL) 15 MG  CAPSULE    Take 15 mg  by mouth at bedtime.    TIMOLOL (BETIMOL) 0.5 % OPHTHALMIC SOLUTION    Place 1 drop into both eyes 2 (two) times daily.    TORSEMIDE (DEMADEX) 20 MG TABLET    Take 40 mg by mouth one time a day on Monday, Wednesday and Friday   UNABLE TO FIND    House Supplement - Take 4 oz by mouth 2 times daily  Modified Medications   No medications on file  Discontinued Medications   B COMPLEX VITAMINS TABLET    Take 1 tablet by mouth daily as needed (for supplement).    CHOLECALCIFEROL (VITAMIN D) 2000 UNITS TABLET    Take 2,000 Units by mouth daily.    MAGNESIUM 400 MG TABS    Take 400 mg by mouth daily.     SIGNIFICANT DIAGNOSTIC EXAMS   01-20-16; chest x-ray: 1. Cardiomegaly and mild interstitial edema. 2. Improved aeration.  01-21-16: ct of abdomen and pelvis: 1. Apparent wall thickening at the rectum could reflect mild proctitis. 2. Small bilateral pleural effusions noted. Bibasilar airspace opacities may reflect atelectasis or possibly infection. 3. Large right-sided hiatal hernia, containing most of the stomach. 4. Mild soft tissue inflammation suggested about the bladder. Would correlate for any evidence of cystitis. 5. Nonspecific soft tissue inflammation suggested about the transverse colon. 6. Mild inflammation about the gallbladder is nonspecific, given the underlying diffuse soft tissue inflammation about the abdomen. 7. Very large anterior abdominal wall defect again noted, with packing material and underlying soft tissue thickening. 8. Scattered diverticulosis along the distal sigmoid colon, without evidence of diverticulitis. 9. Scattered aortic atherosclerosis noted. 10. Right convex thoracolumbar scoliosis noted.   01-21-16 ct of neck: 1. Findings consistent with right parotiditis. No abscess identified. No significant lymphadenopathy. 2. Partially visualized small right pleural effusion and dependent right upper lobe opacity which may represent associated  pneumonia or atelectasis.  LABS REVIEWED:   12-27-15: pre-albumin 6.9 01-20-16; wbc 13.8; hgb 10.2; hct 32.6; mcv 95.0; plt 447; gluocose 109; bun 14; creat 0.62; k+ 4.7; na++ 131; alk phos 130; albumin 1.9 01-23-16: wbc 15.1; hgb 8.7; hct 27.2; mcv 93.6; plt 381; glucose 92; bun 8; creat 0.62; k+ 3.8; na++ 134 02-15-16: wbc 9.5; hgb 9.9; hct 31; plt 379; glucose 110; bun 9; creat 0.6; k+ 4.3; na++ 135      Review of Systems  Unable to perform ROS: Other  poor historian    Physical Exam  Constitutional: No distress.  Frail   Eyes: Conjunctivae are normal.  Neck: Neck supple. No JVD present. No thyromegaly present.  Cardiovascular: Normal rate, regular rhythm and intact distal pulses.   Murmur heard. Respiratory: Effort normal and breath sounds normal. No respiratory distress. She has no wheezes.  GI: Soft. Bowel sounds are normal. She exhibits no distension. There is no tenderness.  Abdominal wound: dressing intact  Musculoskeletal: She exhibits lower extremity edema   Able to move all extremities   Lymphadenopathy:    She has no cervical adenopathy.  Neurological: She is alert.  Skin: Skin is warm and dry. She is not diaphoretic.  Psychiatric: She has a normal mood and affect.    ASSESSMENT/ PLAN:  1. Chronic pain syndrome: has chronic bilateral low back pain: is on chronic narcotic therapy: will continue methadone 5 mg twice daily robaxin 500 mg three times daily neurontin 300 mg twice daily  lidoderm patch; and voltaren gel 1 gm twice daily to knees oxycodone 5 or 10 mg every 8 hours as needed  Has  benadryl 12.5 mg every 6 hours as needed for itching.   2. Constipation: will continue senna 2 tabs nightly   miralax 17 gm  daily    3. Status post laparotomy with removal of mesh: has large abdominal wound: will continue current treatment is followed by wound doctor   4. Insomnia: will continue restoril 15 mg nightly   5. Bilateral DVT: femoral veins: will continue xarelto  20 mg daily   6. CHF: will change to demadex 40 mg daily with k+ 20 meq daily is  02 dependent   7. gerd: will continue nexium 20 mg daily   8. Depression with anxiety: will continue cymbalta 60 mg daily   9. Glaucoma: will continue timolol to both eyes twice daily   10. Allergic rhinits: will continue flonase   11. Pulmonary arterial hypertension: b/p 103/48 Will check cbc; cmp lipids tsh     MD is aware of resident's narcotic use and is in agreement with current plan of care. We will attempt to wean resident as apropriate   Ok Edwards NP [facility]  Contact [phone] Monday through Friday 8am- 5pm  After hours call [phone]

## 2016-06-27 DIAGNOSIS — L98499 Non-pressure chronic ulcer of skin of other sites with unspecified severity: Secondary | ICD-10-CM | POA: Diagnosis not present

## 2016-07-03 ENCOUNTER — Encounter (HOSPITAL_COMMUNITY): Payer: Self-pay | Admitting: Emergency Medicine

## 2016-07-03 ENCOUNTER — Emergency Department (HOSPITAL_COMMUNITY): Payer: PPO

## 2016-07-03 ENCOUNTER — Emergency Department (HOSPITAL_COMMUNITY)
Admission: EM | Admit: 2016-07-03 | Discharge: 2016-07-03 | Disposition: A | Discharge status: Home / Self Care | Payer: PPO | Attending: Emergency Medicine | Admitting: Emergency Medicine

## 2016-07-03 DIAGNOSIS — S39012A Strain of muscle, fascia and tendon of lower back, initial encounter: Secondary | ICD-10-CM | POA: Diagnosis not present

## 2016-07-03 DIAGNOSIS — Z7901 Long term (current) use of anticoagulants: Secondary | ICD-10-CM | POA: Diagnosis not present

## 2016-07-03 DIAGNOSIS — I509 Heart failure, unspecified: Secondary | ICD-10-CM | POA: Diagnosis not present

## 2016-07-03 DIAGNOSIS — Y929 Unspecified place or not applicable: Secondary | ICD-10-CM | POA: Insufficient documentation

## 2016-07-03 DIAGNOSIS — S79911A Unspecified injury of right hip, initial encounter: Secondary | ICD-10-CM | POA: Diagnosis not present

## 2016-07-03 DIAGNOSIS — I110 Hypertensive heart disease with heart failure: Secondary | ICD-10-CM | POA: Diagnosis not present

## 2016-07-03 DIAGNOSIS — M545 Low back pain: Secondary | ICD-10-CM | POA: Diagnosis not present

## 2016-07-03 DIAGNOSIS — Z79899 Other long term (current) drug therapy: Secondary | ICD-10-CM | POA: Insufficient documentation

## 2016-07-03 DIAGNOSIS — M25551 Pain in right hip: Secondary | ICD-10-CM | POA: Diagnosis not present

## 2016-07-03 DIAGNOSIS — Z96612 Presence of left artificial shoulder joint: Secondary | ICD-10-CM | POA: Diagnosis not present

## 2016-07-03 DIAGNOSIS — S3992XA Unspecified injury of lower back, initial encounter: Secondary | ICD-10-CM | POA: Diagnosis not present

## 2016-07-03 DIAGNOSIS — W07XXXA Fall from chair, initial encounter: Secondary | ICD-10-CM | POA: Diagnosis not present

## 2016-07-03 DIAGNOSIS — M549 Dorsalgia, unspecified: Secondary | ICD-10-CM | POA: Diagnosis not present

## 2016-07-03 DIAGNOSIS — M546 Pain in thoracic spine: Secondary | ICD-10-CM | POA: Diagnosis not present

## 2016-07-03 DIAGNOSIS — Y939 Activity, unspecified: Secondary | ICD-10-CM | POA: Diagnosis not present

## 2016-07-03 DIAGNOSIS — Y999 Unspecified external cause status: Secondary | ICD-10-CM | POA: Insufficient documentation

## 2016-07-03 MED ORDER — FENTANYL CITRATE (PF) 100 MCG/2ML IJ SOLN
25.0000 ug | Freq: Once | INTRAMUSCULAR | Status: AC
  Administered 2016-07-03: 25 ug via INTRAMUSCULAR
  Filled 2016-07-03: qty 2

## 2016-07-03 MED ORDER — DIAZEPAM 5 MG PO TABS
5.0000 mg | ORAL_TABLET | Freq: Once | ORAL | Status: AC
  Administered 2016-07-03: 5 mg via ORAL
  Filled 2016-07-03: qty 1

## 2016-07-03 NOTE — ED Notes (Signed)
Bed: [facility] Expected date:  Expected time:  Means of arrival:  Comments: EMS 83 yo back pain

## 2016-07-03 NOTE — ED Notes (Signed)
Dressing reinforced per patient request

## 2016-07-03 NOTE — ED Provider Notes (Addendum)
[facility] DEPT Provider Note   CSN: [id number] Arrival date & time: 07/03/16  1520     History   Chief Complaint Chief Complaint  Patient presents with  . Back Pain    chronic    HPI Monique Russo is a 83 y.o. female history of anemia, arthritis, CHF, chronic back pain here presenting with worsening back pain. The patient was living currently at a nursing facility. Patient states that she is currently on oxycodone, Neurontin, lidocaine patch for her chronic back pain. She was on her way to see her surgeon today for her chronic abdominal wound. She states that she was unfortunately transported to the wrong office and she slid down and had severe back pain afterwards. Denies any back injury afterwards. She has pain on the right side of her back and has trouble sitting up due to pain. Pain saw PCP several days ago who was trying to titrate down her oxycodone.     The history is provided by the patient.    Past Medical History:  Diagnosis Date  . Anemia   . Arthritis   . CHF (congestive heart failure) ([place])   . Depression   . GERD (gastroesophageal reflux disease)   . Hyponatremia 01/2016  . Perforated chronic gastric ulcer ([place]) 08/11/2014  . Pneumonia   . Pulmonary arterial hypertension ([place]) 11/28/2014    Patient Active Problem List   Diagnosis Date Noted  . Depression 06/10/2016  . Acute bilateral deep vein thrombosis (DVT) of femoral veins ([place]) 05/29/2016  . Surgical wound, non healing, subsequent encounter 05/29/2016  . Insomnia 05/20/2016  . Constipation due to opioid therapy 04/08/2016  . Chronic narcotic use 02/15/2016  . GERD (gastroesophageal reflux disease) 02/15/2016  . Acute blood loss anemia 01/20/2016  . Hypotension   . Protein-calorie malnutrition, severe ([place])   . Chronic bilateral low back pain without sciatica   . Pulmonary arterial hypertension ([place]) 11/28/2014  . Chronic pain syndrome     Past Surgical History:  Procedure Laterality Date    . ABDOMINAL HYSTERECTOMY    . ABDOMINAL SURGERY    . BACK SURGERY  2000  . COLON SURGERY     x2-blockage  . COLONOSCOPY    . ERCP    . HERNIA REPAIR  2012   x2 with mesh  . JOINT REPLACEMENT Left    shoulder  . LAPAROTOMY N/A 08/04/2014   Procedure: EXPLORATORY LAPAROTOMY, REPAIR OF PERFORATED ULCER;  Surgeon: Excell Seltzer, MD;  Location: [facility];  Service: General;  Laterality: N/A;  . LAPAROTOMY N/A 12/21/2015   Procedure: EXPLORATORY LAPAROTOMY/ REMOVAL OF MESH;  Surgeon: Coralie Keens, MD;  Location: [facility];  Service: General;  Laterality: N/A;  . ORIF WRIST FRACTURE  2012   left  . REVERSE SHOULDER ARTHROPLASTY Right 12/04/2013  . REVERSE SHOULDER ARTHROPLASTY  12/04/2013   Procedure: REVERSE SHOULDER ARTHROPLASTY;  Surgeon: Nita Sells, MD;  Location: [facility];  Service: Orthopedics;;  Right reverse total shoulder arthroplasty  . ROTATOR CUFF REPAIR     dr Tamera Punt  . SHOULDER ARTHROSCOPY  2012   lt and rt  . TENDON REPAIR Right 04/13/2014   Procedure: RIGHT HAND CENTRAL TENDON CENTRALIZATION OF LONG AND RING FINGERS;  Surgeon: Jolyn Nap, MD;  Location: [facility];  Service: Orthopedics;  Laterality: Right;    OB History    No data available       Home Medications    Prior to Admission medications   Medication Sig  Start Date End Date Taking? Authorizing Provider  acetaminophen (TYLENOL) 325 MG tablet Take 325 mg by mouth every 6 (six) hours as needed for mild pain.  07/10/13   Melton Alar, PA-C  alum & mag hydroxide-simeth (MAALOX/MYLANTA) 200-200-20 MG/5ML suspension Take 15 mLs by mouth as needed for indigestion or heartburn. 01/06/16   Doreatha Lew, MD  Amino Acids-Protein Hydrolys (FEEDING SUPPLEMENT, PRO-STAT SUGAR FREE 64,) LIQD Take 30 mLs by mouth 3 (three) times daily with meals.    Historical Provider, MD  bisacodyl (DULCOLAX) 10 MG suppository Place 1 suppository (10 mg total) rectally daily as needed for moderate  constipation (May repeat times one). 07/10/13   Bobby Rumpf [place], PA-C  budesonide (RHINOCORT AQUA) 32 MCG/ACT nasal spray Place 1 spray into both nostrils daily as needed for rhinitis.    Historical Provider, MD  dextromethorphan-guaiFENesin (MUCINEX DM) 30-600 MG 12hr tablet Take 1 tablet by mouth daily.    Historical Provider, MD  diclofenac sodium (VOLTAREN) 1 % GEL Apply 1 g topically 2 (two) times daily. Apply to bilateral knees for pain    Historical Provider, MD  DiphenhydrAMINE HCl (BENADRYL PO) Take by mouth. Take 0.5 tablet every 6 hours as needed for itching    Historical Provider, MD  divalproex (DEPAKOTE SPRINKLE) 125 MG capsule Take 250 mg by mouth at bedtime.    Historical Provider, MD  DULoxetine (CYMBALTA) 60 MG capsule Take 60 mg by mouth at bedtime.     Historical Provider, MD  Esomeprazole Magnesium (NEXIUM 24HR PO) Take 20 mg by mouth daily.     Historical Provider, MD  fluticasone (FLONASE) 50 MCG/ACT nasal spray Place 1 spray into both nostrils at bedtime.    Historical Provider, MD  gabapentin (NEURONTIN) 300 MG capsule Take 300 mg by mouth 2 (two) times daily.    Historical Provider, MD  lidocaine (LIDODERM) 5 % Place 1 patch onto the skin daily. Remove & Discard patch within 12 hours or as directed by MD    Historical Provider, MD  magnesium hydroxide (MILK OF MAGNESIA) 400 MG/5ML suspension Take 30 mLs by mouth every other day. FOR CONSTIPATION    Historical Provider, MD  methadone (DOLOPHINE) 5 MG tablet Take one tablet by mouth twice daily for pain 02/16/16   Gildardo Cranker, DO  methocarbamol (ROBAXIN) 500 MG tablet Take 500 mg by mouth 3 (three) times daily.    Historical Provider, MD  Multiple Vitamin (MULTIVITAMIN) tablet Take 1 tablet by mouth daily.    Historical Provider, MD  Omega-3 Fatty Acids (FISH OIL) 1000 MG CPDR Take 1,000 mg by mouth daily.     Historical Provider, MD  oxyCODONE (OXY IR/ROXICODONE) 5 MG immediate release tablet Take 1-2 tablets (5-10 mg total)  by mouth every 8 (eight) hours as needed. For pain 02/16/16   Gildardo Cranker, DO  OXYGEN Inhale into the lungs. 2-6 L/min to keep stat at 90 or above    Historical Provider, MD  polyethylene glycol (MIRALAX / GLYCOLAX) packet Take 17 g by mouth daily.     Historical Provider, MD  potassium chloride (KLOR-CON) 20 MEQ packet Take 20 mEq by mouth daily as needed. Take 1 tablet by mouth daily as needed only when Lasix is given    Historical Provider, MD  rivaroxaban (XARELTO) 20 MG TABS tablet Take 20 mg by mouth at bedtime.    Historical Provider, MD  senna (SENOKOT) 8.6 MG TABS tablet Take 2 tablets (17.2 mg total) by mouth at bedtime. 01/24/16  Shanker Kristeen Mans, MD  temazepam (RESTORIL) 15 MG capsule Take 15 mg by mouth at bedtime.     Historical Provider, MD  timolol (BETIMOL) 0.5 % ophthalmic solution Place 1 drop into both eyes 2 (two) times daily.     Historical Provider, MD  torsemide (DEMADEX) 20 MG tablet Take 40 mg by mouth one time a day on Monday, Wednesday and Friday    Historical Provider, MD  UNABLE TO FIND House Supplement - Take 4 oz by mouth 2 times daily    Historical Provider, MD    Family History Family History  Problem Relation Age of Onset  . Stroke Mother   . Breast cancer Sister   . Ulcerative colitis Daughter   . Chronic fatigue Daughter   . Chronic fatigue Daughter     Social History Social History  Substance Use Topics  . Smoking status: Never Smoker  . Smokeless tobacco: Never Used  . Alcohol use 4.2 oz/week    7 Glasses of wine per week     Comment: 8 oz -12 oz of wine every      Allergies   Tape   Review of Systems Review of Systems  Musculoskeletal: Positive for back pain.  All other systems reviewed and are negative.    Physical Exam Updated Vital Signs BP 110/79 (BP Location: Right Arm)   Pulse 76   Temp 98.8 F (37.1 C) (Oral)   Resp 19   SpO2 96%   Physical Exam  Constitutional: She is oriented to person, place, and time.    Uncomfortable   HENT:  Head: Normocephalic.  Eyes: EOM are normal. Pupils are equal, round, and reactive to light.  Neck: Normal range of motion. Neck supple.  Cardiovascular: Normal rate, regular rhythm and normal heart sounds.   Pulmonary/Chest: Effort normal and breath sounds normal. No respiratory distress. She has no wheezes. She has no rales.  Abdominal: Soft. Bowel sounds are normal.  Midline abdominal wound that is open with minimal bleeding. No signs of cellulitis or abscess   Musculoskeletal: Normal range of motion.  + R paralumbar and R hip tenderness, nl ROM R hip. Straight leg raise L side   Neurological: She is alert and oriented to person, place, and time.  No saddle anesthesia, nl strength and sensation bilateral lower extremities   Skin: Skin is warm.  Psychiatric: She has a normal mood and affect.  Nursing note and vitals reviewed.    ED Treatments / Results  Labs (all labs ordered are listed, but only abnormal results are displayed) Labs Reviewed - No data to display  EKG  EKG Interpretation None       Radiology Dg Lumbar Spine Complete  Result Date: 07/03/2016 CLINICAL DATA:  Chronic lower back pain. Status post fall out of chair onto floor. Initial encounter. EXAM: LUMBAR SPINE - COMPLETE 4+ VIEW COMPARISON:  CT of the abdomen and pelvis performed 01/20/2016 FINDINGS: Right convex thoracolumbar scoliosis is again noted, with underlying degenerative change. Prominent anterior and lateral osteophytes are seen. These findings are relatively stable from the prior study. There is no definite evidence of acute fracture or subluxation along the lumbar spine. There is mild narrowing of multiple intervertebral disc spaces, though this is difficult to fully assess. Facet disease is noted along the lower thoracic and lumbar spine. The visualized bowel gas pattern is grossly unremarkable. IMPRESSION: No evidence of fracture or subluxation along the lumbar spine. Right  convex thoracolumbar scoliosis again noted, with underlying degenerative change.  Electronically Signed   By: Garald Balding M.D.   On: 07/03/2016 16:17   Dg Hip Unilat W Or Wo Pelvis 2-3 Views Right  Result Date: 07/03/2016 CLINICAL DATA:  Fall.  Hip pain . EXAM: DG HIP (WITH OR WITHOUT PELVIS) 2-3V RIGHT COMPARISON:  Lumbar spine 07/03/2016.  CT 01/20/2016 . FINDINGS: Surgical clips are noted in the pelvis. Lumbar spine scoliosis and degenerative change. Degenerative changes both hips. No definite fracture. No dislocation. Soft tissue calcification noted projected over the proximal thigh. These are most likely benign IMPRESSION: No evidence of fracture or dislocation. Electronically Signed   By: Marcello Moores  Register   On: 07/03/2016 17:01    Procedures Procedures (including critical care time)  The wound is cleansed, debrided of foreign material as much as possible, and dressed. The patient is alerted to watch for any signs of infection (redness, pus, pain, increased swelling or fever) and call if such occurs. Home wound care instructions are provided. There are some spots that some mild bleeding, applied surgicel and bleeding stopping. Applied wet to dry dressing.   Medications Ordered in ED Medications  fentaNYL (SUBLIMAZE) injection 25 mcg (25 mcg Intramuscular Given 07/03/16 1622)  diazepam (VALIUM) tablet 5 mg (5 mg Oral Given 07/03/16 1622)     Initial Impression / Assessment and Plan / ED Course  I have reviewed the triage vital signs and the nursing notes.  Pertinent labs & imaging results that were available during my care of the patient were reviewed by me and considered in my medical decision making (see chart for details).    Ader Manon [place]bury is a 83 y.o. female here with back pain. Worsening of chronic back pain. + straight leg raise on L side otherwise neurovascular intact. Will get xrays. Will give pain meds and reassess.   5:53 PM Felt better after pain meds. Has oxycodone,  robaxin, lidocaine patch at the facility. She is from nursing facility. Will dc back to facility.     Final Clinical Impressions(s) / ED Diagnoses   Final diagnoses:  Strain of lumbar region, initial encounter    New Prescriptions New Prescriptions   No medications on file     Drenda Freeze, MD 07/03/16 [place] Maleke Feria, MD 07/03/16 1753

## 2016-07-03 NOTE — ED Notes (Signed)
Pt is awaiting transport, sandwich given

## 2016-07-03 NOTE — Discharge Instructions (Signed)
Continue taking your pain medicines as prescribed.   See your doctor  See your spine doctor.   Return to ER if you have severe abdominal pain, purulent drainage from the wound, back pain, weakness, numbness

## 2016-07-03 NOTE — ED Triage Notes (Signed)
Pt from PCP where she was seeen for chronic back pain. Per staff.  Pt reports severe lower back pain and was unable to sit on chair due to the pain. Pt slid out of chair to the floor. EMS was called. Pt is wheelchair ridden originally from facility. EMS were unable to provide facility info.

## 2016-07-04 ENCOUNTER — Non-Acute Institutional Stay (SKILLED_NURSING_FACILITY): Payer: PPO | Admitting: Adult Health

## 2016-07-04 ENCOUNTER — Encounter: Payer: Self-pay | Admitting: Adult Health

## 2016-07-04 DIAGNOSIS — G894 Chronic pain syndrome: Secondary | ICD-10-CM

## 2016-07-04 DIAGNOSIS — L98499 Non-pressure chronic ulcer of skin of other sites with unspecified severity: Secondary | ICD-10-CM | POA: Diagnosis not present

## 2016-07-04 DIAGNOSIS — F1190 Opioid use, unspecified, uncomplicated: Secondary | ICD-10-CM

## 2016-07-04 DIAGNOSIS — G8929 Other chronic pain: Secondary | ICD-10-CM | POA: Diagnosis not present

## 2016-07-04 DIAGNOSIS — M545 Low back pain: Secondary | ICD-10-CM | POA: Diagnosis not present

## 2016-07-04 DIAGNOSIS — A419 Sepsis, unspecified organism: Secondary | ICD-10-CM | POA: Diagnosis not present

## 2016-07-04 NOTE — Progress Notes (Signed)
Location:   [facility] Room Number: 105 A Place of Service:  SNF (31)   CODE STATUS: Full Code  Allergies  Allergen Reactions  . Tape Other (See Comments)    SKIN IS VERY THIN; IT BRUISES AND TEARS EASILY!! -THX    Chief Complaint  Patient presents with  . Acute Visit    ED Follow up    HPI:  She had been on her way to a doctor appointment; when her wheelchair tipped backwards. She was taken to the ED for further evaluation. She given medication for her pain. I have been asked to see her this morning. She is lethargic; told mer her back hurt and went back to sleep.    Past Medical History:  Diagnosis Date  . Anemia   . Arthritis   . CHF (congestive heart failure) ([place])   . Depression   . GERD (gastroesophageal reflux disease)   . Hyponatremia 01/2016  . Perforated chronic gastric ulcer ([place]) 08/11/2014  . Pneumonia   . Pulmonary arterial hypertension ([place]) 11/28/2014    Past Surgical History:  Procedure Laterality Date  . ABDOMINAL HYSTERECTOMY    . ABDOMINAL SURGERY    . BACK SURGERY  2000  . COLON SURGERY     x2-blockage  . COLONOSCOPY    . ERCP    . HERNIA REPAIR  2012   x2 with mesh  . JOINT REPLACEMENT Left    shoulder  . LAPAROTOMY N/A 08/04/2014   Procedure: EXPLORATORY LAPAROTOMY, REPAIR OF PERFORATED ULCER;  Surgeon: Excell Seltzer, MD;  Location: [facility];  Service: General;  Laterality: N/A;  . LAPAROTOMY N/A 12/21/2015   Procedure: EXPLORATORY LAPAROTOMY/ REMOVAL OF MESH;  Surgeon: Coralie Keens, MD;  Location: [facility];  Service: General;  Laterality: N/A;  . ORIF WRIST FRACTURE  2012   left  . REVERSE SHOULDER ARTHROPLASTY Right 12/04/2013  . REVERSE SHOULDER ARTHROPLASTY  12/04/2013   Procedure: REVERSE SHOULDER ARTHROPLASTY;  Surgeon: Nita Sells, MD;  Location: [facility];  Service: Orthopedics;;  Right reverse total shoulder arthroplasty  . ROTATOR CUFF REPAIR     dr Tamera Punt  . SHOULDER ARTHROSCOPY  2012   lt and rt  .  TENDON REPAIR Right 04/13/2014   Procedure: RIGHT HAND CENTRAL TENDON CENTRALIZATION OF LONG AND RING FINGERS;  Surgeon: Jolyn Nap, MD;  Location: [facility];  Service: Orthopedics;  Laterality: Right;    Social History   Social History  . Marital status: Married    Spouse name: N/A  . Number of children: N/A  . Years of education: N/A   Occupational History  . Not on file.   Social History Main Topics  . Smoking status: Never Smoker  . Smokeless tobacco: Never Used  . Alcohol use 4.2 oz/week    7 Glasses of wine per week     Comment: 8 oz -12 oz of wine every   . Drug use: No  . Sexual activity: Not on file   Other Topics Concern  . Not on file   Social History Narrative  . No narrative on file   Family History  Problem Relation Age of Onset  . Stroke Mother   . Breast cancer Sister   . Ulcerative colitis Daughter   . Chronic fatigue Daughter   . Chronic fatigue Daughter       VITAL SIGNS BP 98/60   Pulse 67   Temp 97.3 F (36.3 C)   Resp 18   Ht  _0  (1.6 m)   Wt 168 lb 3.2 oz (76.3 kg)   SpO2 98%   BMI 29.80 kg/m   Patient's Medications  New Prescriptions   No medications on file  Previous Medications   ACETAMINOPHEN (TYLENOL) 325 MG TABLET    Take 325 mg by mouth every 6 (six) hours as needed for mild pain.    ALUM & MAG HYDROXIDE-SIMETH (MAALOX/MYLANTA) 200-200-20 MG/5ML SUSPENSION    Take 15 mLs by mouth as needed for indigestion or heartburn.   AMINO ACIDS-PROTEIN HYDROLYS (FEEDING SUPPLEMENT, PRO-STAT SUGAR FREE 64,) LIQD    Take 30 mLs by mouth 3 (three) times daily with meals.   BISACODYL (DULCOLAX) 10 MG SUPPOSITORY    Place 1 suppository (10 mg total) rectally daily as needed for moderate constipation (May repeat times one).   BUDESONIDE (RHINOCORT AQUA) 32 MCG/ACT NASAL SPRAY    Place 1 spray into both nostrils daily as needed for rhinitis.   DICLOFENAC SODIUM (VOLTAREN) 1 % GEL    Apply 1 g topically 2 (two) times  daily. Apply to bilateral knees for pain   DIPHENHYDRAMINE HCL (BENADRYL PO)    Take 0.5 tablet every 6 hours as needed for itching    DIVALPROEX (DEPAKOTE SPRINKLE) 125 MG CAPSULE    Take 250 mg by mouth at bedtime.   DULOXETINE (CYMBALTA) 60 MG CAPSULE    Take 60 mg by mouth at bedtime.    ESOMEPRAZOLE MAGNESIUM (NEXIUM 24HR PO)    Take 20 mg by mouth daily.    FLUTICASONE (FLONASE) 50 MCG/ACT NASAL SPRAY    Place 1 spray into both nostrils at bedtime.   GABAPENTIN (NEURONTIN) 300 MG CAPSULE    Take 300 mg by mouth 2 (two) times daily.   GUAIFENESIN (MUCINEX) 600 MG 12 HR TABLET    Take 600 mg by mouth daily.   LIDOCAINE (LIDODERM) 5 %    Place 1 patch onto the skin daily. Remove & Discard patch within 12 hours or as directed by MD   MAGNESIUM HYDROXIDE (MILK OF MAGNESIA) 400 MG/5ML SUSPENSION    Take 30 mLs by mouth every other day. FOR CONSTIPATION   METHADONE (DOLOPHINE) 5 MG TABLET    Take one tablet by mouth twice daily for pain   METHOCARBAMOL (ROBAXIN) 500 MG TABLET    Take 500 mg by mouth 3 (three) times daily.   MULTIPLE VITAMIN (MULTIVITAMIN) TABLET    Take 1 tablet by mouth daily.   OMEGA-3 FATTY ACIDS (FISH OIL) 1000 MG CPDR    Take 1,000 mg by mouth daily.    OXYCODONE (OXY IR/ROXICODONE) 5 MG IMMEDIATE RELEASE TABLET    Take 1-2 tablets (5-10 mg total) by mouth every 8 (eight) hours as needed. For pain   OXYGEN    Inhale into the lungs. 2-6 L/min to keep stat at 90 or above   POLYETHYLENE GLYCOL (MIRALAX / GLYCOLAX) PACKET    Take 17 g by mouth daily.    POTASSIUM CHLORIDE (KLOR-CON) 20 MEQ PACKET    Take 20 mEq by mouth daily as needed. Take 1 tablet by mouth daily as needed only when Lasix is given   RIVAROXABAN (XARELTO) 20 MG TABS TABLET    Take 20 mg by mouth at bedtime.   SENNA (SENOKOT) 8.6 MG TABS TABLET    Take 2 tablets (17.2 mg total) by mouth at bedtime.   TEMAZEPAM (RESTORIL) 15 MG CAPSULE    Take 15 mg by mouth at bedtime.    TIMOLOL (  BETIMOL) 0.5 % OPHTHALMIC  SOLUTION    Place 1 drop into both eyes 2 (two) times daily.    TORSEMIDE (DEMADEX) 20 MG TABLET    Take 2 tablets mg by mouth one time a day on Monday, Wednesday and Friday   UNABLE TO FIND    House Supplement - Take 4 oz by mouth 2 times daily  Modified Medications   No medications on file  Discontinued Medications   DEXTROMETHORPHAN-GUAIFENESIN (MUCINEX DM) 30-600 MG 12HR TABLET    Take 1 tablet by mouth daily.     SIGNIFICANT DIAGNOSTIC EXAMS  01-20-16; chest x-ray: 1. Cardiomegaly and mild interstitial edema. 2. Improved aeration.  01-21-16: ct of abdomen and pelvis: 1. Apparent wall thickening at the rectum could reflect mild proctitis. 2. Small bilateral pleural effusions noted. Bibasilar airspace opacities may reflect atelectasis or possibly infection. 3. Large right-sided hiatal hernia, containing most of the stomach. 4. Mild soft tissue inflammation suggested about the bladder. Would correlate for any evidence of cystitis. 5. Nonspecific soft tissue inflammation suggested about the transverse colon. 6. Mild inflammation about the gallbladder is nonspecific, given the underlying diffuse soft tissue inflammation about the abdomen. 7. Very large anterior abdominal wall defect again noted, with packing material and underlying soft tissue thickening. 8. Scattered diverticulosis along the distal sigmoid colon, without evidence of diverticulitis. 9. Scattered aortic atherosclerosis noted. 10. Right convex thoracolumbar scoliosis noted.   01-21-16 ct of neck: 1. Findings consistent with right parotiditis. No abscess identified. No significant lymphadenopathy. 2. Partially visualized small right pleural effusion and dependent right upper lobe opacity which may represent associated pneumonia or atelectasis.  07-03-16: right hip x-ray:No evidence of fracture or dislocation.   07-03-16: lumbar spine x-ray: No evidence of fracture or subluxation along the lumbar spine. Right convex  thoracolumbar scoliosis again noted, with underlying degenerative change.   LABS REVIEWED:   12-27-15: pre-albumin 6.9 01-20-16; wbc 13.8; hgb 10.2; hct 32.6; mcv 95.0; plt 447; gluocose 109; bun 14; creat 0.62; k+ 4.7; na++ 131; alk phos 130; albumin 1.9 01-23-16: wbc 15.1; hgb 8.7; hct 27.2; mcv 93.6; plt 381; glucose 92; bun 8; creat 0.62; k+ 3.8; na++ 134 02-15-16: wbc 9.5; hgb 9.9; hct 31; plt 379; glucose 110; bun 9; creat 0.6; k+ 4.3; na++ 135      Review of Systems  Unable to perform ROS: Other  poor historian    Physical Exam  Constitutional: No distress.  Frail   Eyes: Conjunctivae are normal.  Neck: Neck supple. No JVD present. No thyromegaly present.  Cardiovascular: Normal rate, regular rhythm and intact distal pulses.   Murmur heard. Respiratory: Effort normal and breath sounds normal. No respiratory distress. She has no wheezes.  GI: Soft. Bowel sounds are normal. She exhibits no distension. There is no tenderness.  Abdominal wound: 7.9 x 11.2 x 1.4 cm  Musculoskeletal: She exhibits lower extremity edema   Able to move all extremities   Lymphadenopathy:    She has no cervical adenopathy.  Neurological: She is alert.  Skin: Skin is warm and dry. She is not diaphoretic.  Psychiatric: She has a normal mood and affect.    ASSESSMENT/ PLAN:  1. Chronic pain syndrome: has chronic bilateral low back pain: is on chronic narcotic therapy: will continue methadone 5 mg twice daily  neurontin 300 mg twice daily  lidoderm patch; and voltaren gel 1 gm twice daily to knees oxycodone 5 or 10 mg every 8 hours as needed    Has  benadryl 12.5  mg every 6 hours as needed for itching.   Will increase her robaxin to 500 mg four times daily     MD is aware of resident's narcotic use and is in agreement with current plan of care. We will attempt to wean resident as apropriate     Ok Edwards NP [facility]  Contact [phone] Monday through Friday 8am- 5pm  After  hours call [phone]

## 2016-07-11 DIAGNOSIS — L98499 Non-pressure chronic ulcer of skin of other sites with unspecified severity: Secondary | ICD-10-CM | POA: Diagnosis not present

## 2016-07-18 DIAGNOSIS — L98499 Non-pressure chronic ulcer of skin of other sites with unspecified severity: Secondary | ICD-10-CM | POA: Diagnosis not present

## 2016-07-19 ENCOUNTER — Encounter: Payer: Self-pay | Admitting: Adult Health

## 2016-07-19 ENCOUNTER — Non-Acute Institutional Stay (SKILLED_NURSING_FACILITY): Payer: PPO | Admitting: Adult Health

## 2016-07-19 DIAGNOSIS — K5903 Drug induced constipation: Secondary | ICD-10-CM

## 2016-07-19 DIAGNOSIS — I2721 Secondary pulmonary arterial hypertension: Secondary | ICD-10-CM | POA: Diagnosis not present

## 2016-07-19 DIAGNOSIS — E43 Unspecified severe protein-calorie malnutrition: Secondary | ICD-10-CM | POA: Diagnosis not present

## 2016-07-19 DIAGNOSIS — G8929 Other chronic pain: Secondary | ICD-10-CM

## 2016-07-19 DIAGNOSIS — M545 Low back pain: Secondary | ICD-10-CM | POA: Diagnosis not present

## 2016-07-19 DIAGNOSIS — S3092XA Unspecified superficial injury of abdominal wall, initial encounter: Secondary | ICD-10-CM | POA: Diagnosis not present

## 2016-07-19 DIAGNOSIS — T8189XD Other complications of procedures, not elsewhere classified, subsequent encounter: Secondary | ICD-10-CM | POA: Diagnosis not present

## 2016-07-19 DIAGNOSIS — K219 Gastro-esophageal reflux disease without esophagitis: Secondary | ICD-10-CM

## 2016-07-19 DIAGNOSIS — T8189XA Other complications of procedures, not elsewhere classified, initial encounter: Secondary | ICD-10-CM | POA: Diagnosis not present

## 2016-07-19 DIAGNOSIS — I82413 Acute embolism and thrombosis of femoral vein, bilateral: Secondary | ICD-10-CM | POA: Diagnosis not present

## 2016-07-19 DIAGNOSIS — R100 Acute abdomen: Secondary | ICD-10-CM | POA: Diagnosis not present

## 2016-07-19 DIAGNOSIS — T402X5A Adverse effect of other opioids, initial encounter: Secondary | ICD-10-CM

## 2016-07-19 DIAGNOSIS — G894 Chronic pain syndrome: Secondary | ICD-10-CM | POA: Diagnosis not present

## 2016-07-19 DIAGNOSIS — I509 Heart failure, unspecified: Secondary | ICD-10-CM | POA: Diagnosis not present

## 2016-07-19 NOTE — Progress Notes (Signed)
Location:   [place] Room Number: 105 A Place of Service:      CODE STATUS: Full Code  Allergies  Allergen Reactions  . Tape Other (See Comments)    SKIN IS VERY THIN; IT BRUISES AND TEARS EASILY!! -THX    Chief Complaint  Patient presents with  . Medical Management of Chronic Issues    1 month follow up    HPI:  She is a long term resident of this facility being seen for the management of her chronic illnesses. She is complaining of constipation. She tells me that MOM every other day has always worked her and she does not like senna. I have spoken with her family regarding her overall status. There are no nursing concerns at this time.    Past Medical History:  Diagnosis Date  . Anemia   . Arthritis   . CHF (congestive heart failure) ([place])   . Depression   . GERD (gastroesophageal reflux disease)   . Hyponatremia 01/2016  . Perforated chronic gastric ulcer ([place]) 08/11/2014  . Pneumonia   . Pulmonary arterial hypertension ([place]) 11/28/2014    Past Surgical History:  Procedure Laterality Date  . ABDOMINAL HYSTERECTOMY    . ABDOMINAL SURGERY    . BACK SURGERY  2000  . COLON SURGERY     x2-blockage  . COLONOSCOPY    . ERCP    . HERNIA REPAIR  2012   x2 with mesh  . JOINT REPLACEMENT Left    shoulder  . LAPAROTOMY N/A 08/04/2014   Procedure: EXPLORATORY LAPAROTOMY, REPAIR OF PERFORATED ULCER;  Surgeon: Excell Seltzer, MD;  Location: [facility];  Service: General;  Laterality: N/A;  . LAPAROTOMY N/A 12/21/2015   Procedure: EXPLORATORY LAPAROTOMY/ REMOVAL OF MESH;  Surgeon: Coralie Keens, MD;  Location: [place];  Service: General;  Laterality: N/A;  . ORIF WRIST FRACTURE  2012   left  . REVERSE SHOULDER ARTHROPLASTY Right 12/04/2013  . REVERSE SHOULDER ARTHROPLASTY  12/04/2013   Procedure: REVERSE SHOULDER ARTHROPLASTY;  Surgeon: Nita Sells, MD;  Location: [facility] OR;  Service: Orthopedics;;  Right reverse total shoulder arthroplasty  . ROTATOR  CUFF REPAIR     dr Tamera Punt  . SHOULDER ARTHROSCOPY  2012   lt and rt  . TENDON REPAIR Right 04/13/2014   Procedure: RIGHT HAND CENTRAL TENDON CENTRALIZATION OF LONG AND RING FINGERS;  Surgeon: Jolyn Nap, MD;  Location: [place];  Service: Orthopedics;  Laterality: Right;    Social History   Social History  . Marital status: Married    Spouse name: N/A  . Number of children: N/A  . Years of education: N/A   Occupational History  . Not on file.   Social History Main Topics  . Smoking status: Never Smoker  . Smokeless tobacco: Never Used  . Alcohol use 4.2 oz/week    7 Glasses of wine per week     Comment: 8 oz -12 oz of wine every   . Drug use: No  . Sexual activity: Not on file   Other Topics Concern  . Not on file   Social History Narrative  . No narrative on file   Family History  Problem Relation Age of Onset  . Stroke Mother   . Breast cancer Sister   . Ulcerative colitis Daughter   . Chronic fatigue Daughter   . Chronic fatigue Daughter       VITAL SIGNS BP 133/81   Pulse 66   Temp  98.1 F (36.7 C)   Resp 16   Ht '5\' 3"'  (1.6 m)   Wt 168 lb 12.8 oz (76.6 kg)   SpO2 95%   BMI 29.90 kg/m   Patient's Medications  New Prescriptions   No medications on file  Previous Medications   ACETAMINOPHEN (TYLENOL) 325 MG TABLET    Take 325 mg by mouth every 6 (six) hours as needed for mild pain.    ALUM & MAG HYDROXIDE-SIMETH (MAALOX/MYLANTA) 200-200-20 MG/5ML SUSPENSION    Take 15 mLs by mouth as needed for indigestion or heartburn.   AMINO ACIDS-PROTEIN HYDROLYS (FEEDING SUPPLEMENT, PRO-STAT SUGAR FREE 64,) LIQD    Take 30 mLs by mouth 3 (three) times daily with meals.   BISACODYL (DULCOLAX) 10 MG SUPPOSITORY    Place 1 suppository (10 mg total) rectally daily as needed for moderate constipation (May repeat times one).   BUDESONIDE (RHINOCORT AQUA) 32 MCG/ACT NASAL SPRAY    Place 1 spray into both nostrils daily as needed for rhinitis.    DICLOFENAC SODIUM (VOLTAREN) 1 % GEL    Apply 1 g topically 2 (two) times daily. Apply to bilateral knees for pain   DIPHENHYDRAMINE HCL (BENADRYL PO)    Take 0.5 tablet every 6 hours as needed for itching    DIVALPROEX (DEPAKOTE SPRINKLE) 125 MG CAPSULE    Take 250 mg by mouth at bedtime.   DULOXETINE (CYMBALTA) 60 MG CAPSULE    Take 60 mg by mouth at bedtime.    ESOMEPRAZOLE MAGNESIUM (NEXIUM 24HR PO)    Take 20 mg by mouth daily.    FLUTICASONE (FLONASE) 50 MCG/ACT NASAL SPRAY    Place 1 spray into both nostrils at bedtime.   GABAPENTIN (NEURONTIN) 300 MG CAPSULE    Take 300 mg by mouth 2 (two) times daily.   GUAIFENESIN (MUCINEX) 600 MG 12 HR TABLET    Take 600 mg by mouth daily.   LIDOCAINE (LIDODERM) 5 %    Place 1 patch onto the skin daily. Remove & Discard patch within 12 hours or as directed by MD   MAGNESIUM HYDROXIDE (MILK OF MAGNESIA) 400 MG/5ML SUSPENSION    Take 30 mLs by mouth every other day. FOR CONSTIPATION   METHADONE (DOLOPHINE) 5 MG TABLET    Take one tablet by mouth twice daily for pain   METHOCARBAMOL (ROBAXIN) 500 MG TABLET    Take 500 mg by mouth 4 (four) times daily.    MULTIPLE VITAMIN (MULTIVITAMIN) TABLET    Take 1 tablet by mouth daily.   OMEGA-3 FATTY ACIDS (FISH OIL) 1000 MG CPDR    Take 1,000 mg by mouth daily.    OXYCODONE (OXY IR/ROXICODONE) 5 MG IMMEDIATE RELEASE TABLET    Take 1-2 tablets (5-10 mg total) by mouth every 8 (eight) hours as needed. For pain   OXYGEN    Inhale into the lungs. 2-6 L/min to keep stat at 90 or above   POLYETHYLENE GLYCOL (MIRALAX / GLYCOLAX) PACKET    Take 17 g by mouth daily.    POTASSIUM CHLORIDE (KLOR-CON) 20 MEQ PACKET    Take 20 mEq by mouth daily as needed. Take 1 tablet by mouth daily as needed only when Lasix is given   RIVAROXABAN (XARELTO) 20 MG TABS TABLET    Take 20 mg by mouth at bedtime.   SENNA (SENOKOT) 8.6 MG TABS TABLET    Take 2 tablets (17.2 mg total) by mouth at bedtime.   TEMAZEPAM (RESTORIL) 15 MG CAPSULE  Take 15 mg by mouth at bedtime.    TIMOLOL (BETIMOL) 0.5 % OPHTHALMIC SOLUTION    Place 1 drop into both eyes 2 (two) times daily.    TORSEMIDE (DEMADEX) 20 MG TABLET    Take 2 tablets by mouth once daily   UNABLE TO FIND    House Supplement - Take 4 oz by mouth 2 times daily  Modified Medications   No medications on file  Discontinued Medications   No medications on file     SIGNIFICANT DIAGNOSTIC EXAMS   01-20-16; chest x-ray: 1. Cardiomegaly and mild interstitial edema. 2. Improved aeration.  01-21-16: ct of abdomen and pelvis: 1. Apparent wall thickening at the rectum could reflect mild proctitis. 2. Small bilateral pleural effusions noted. Bibasilar airspace opacities may reflect atelectasis or possibly infection. 3. Large right-sided hiatal hernia, containing most of the stomach. 4. Mild soft tissue inflammation suggested about the bladder. Would correlate for any evidence of cystitis. 5. Nonspecific soft tissue inflammation suggested about the transverse colon. 6. Mild inflammation about the gallbladder is nonspecific, given the underlying diffuse soft tissue inflammation about the abdomen. 7. Very large anterior abdominal wall defect again noted, with packing material and underlying soft tissue thickening. 8. Scattered diverticulosis along the distal sigmoid colon, without evidence of diverticulitis. 9. Scattered aortic atherosclerosis noted. 10. Right convex thoracolumbar scoliosis noted.   01-21-16 ct of neck: 1. Findings consistent with right parotiditis. No abscess identified. No significant lymphadenopathy. 2. Partially visualized small right pleural effusion and dependent right upper lobe opacity which may represent associated pneumonia or atelectasis.  07-03-16: right hip x-ray:No evidence of fracture or dislocation.   07-03-16: lumbar spine x-ray: No evidence of fracture or subluxation along the lumbar spine. Right convex thoracolumbar scoliosis again noted, with  underlying degenerative change.   LABS REVIEWED:   12-27-15: pre-albumin 6.9 01-20-16; wbc 13.8; hgb 10.2; hct 32.6; mcv 95.0; plt 447; gluocose 109; bun 14; creat 0.62; k+ 4.7; na++ 131; alk phos 130; albumin 1.9 01-23-16: wbc 15.1; hgb 8.7; hct 27.2; mcv 93.6; plt 381; glucose 92; bun 8; creat 0.62; k+ 3.8; na++ 134 02-15-16: wbc 9.5; hgb 9.9; hct 31; plt 379; glucose 110; bun 9; creat 0.6; k+ 4.3; na++ 135      Review of Systems  Unable to perform ROS: Other  poor historian    Physical Exam  Constitutional: No distress.  Frail   Eyes: Conjunctivae are normal.  Neck: Neck supple. No JVD present. No thyromegaly present.  Cardiovascular: Normal rate, regular rhythm and intact distal pulses.   Murmur heard. Respiratory: Effort normal and breath sounds normal. No respiratory distress. She has no wheezes.  GI: Soft. Bowel sounds are normal. She exhibits no distension. There is no tenderness.  Abdominal wound: 7.9 x 11.2 x 1.4 cm  Musculoskeletal: She exhibits lower extremity edema   Able to move all extremities   Lymphadenopathy:    She has no cervical adenopathy.  Neurological: She is alert.  Skin: Skin is warm and dry. She is not diaphoretic.  Psychiatric: She has a normal mood and affect.    ASSESSMENT/ PLAN:   1. Chronic pain syndrome: has chronic bilateral low back pain: is on chronic narcotic therapy: will continue methadone 5 mg twice daily robaxin 500 mg four  times daily neurontin 300 mg twice daily  lidoderm patch; and voltaren gel 1 gm twice daily to knees oxycodone 5 or 10 mg every 8 hours as needed    Has  benadryl 12.5 mg every  6 hours as needed for itching.   2. Constipation: will continue   miralax 17 gm  daily   Will begin MOM 60 cc every other day and will stop the senna.    3. Status post laparotomy without  removal of mesh: has large abdominal wound: will continue current treatment is followed by wound doctor   4. Insomnia: will continue restoril 15 mg  nightly   5. Bilateral DVT: femoral veins: will continue xarelto 20 mg daily   6. CHF: will change to demadex 40 mg daily with k+ 20 meq daily is  02 dependent   7. gerd: will continue nexium 20 mg daily   8. Depression with anxiety: will continue cymbalta 60 mg daily takes depakote 250 mg nightly to stabilize mood.   9. Glaucoma: will continue timolol to both eyes twice daily   10. Allergic rhinits: will continue flonase and mucinex daily   11. Pulmonary arterial hypertension: b/p 133/81   MD is aware of resident's narcotic use and is in agreement with current plan of care. We will attempt to wean resident as apropriate   Ok Edwards NP [facility]  Contact [phone] Monday through Friday 8am- 5pm  After hours call [phone]

## 2016-07-25 ENCOUNTER — Encounter: Payer: Self-pay | Admitting: Adult Health

## 2016-07-25 DIAGNOSIS — L98499 Non-pressure chronic ulcer of skin of other sites with unspecified severity: Secondary | ICD-10-CM | POA: Diagnosis not present

## 2016-07-25 NOTE — Progress Notes (Signed)
This encounter was created in error - please disregard.

## 2016-07-25 NOTE — Progress Notes (Signed)
Location:   [place] Room Number: 105 A Place of Service:  SNF (31)   CODE STATUS: Full Code  Allergies  Allergen Reactions  . Tape Other (See Comments)    SKIN IS VERY THIN; IT BRUISES AND TEARS EASILY!! -THX    Chief Complaint  Patient presents with  . Medical Management of Chronic Issues    1 month follow up    HPI:    Past Medical History:  Diagnosis Date  . Anemia   . Arthritis   . CHF (congestive heart failure) ([place])   . Depression   . GERD (gastroesophageal reflux disease)   . Hyponatremia 01/2016  . Perforated chronic gastric ulcer ([place]) 08/11/2014  . Pneumonia   . Pulmonary arterial hypertension ([place]) 11/28/2014    Past Surgical History:  Procedure Laterality Date  . ABDOMINAL HYSTERECTOMY    . ABDOMINAL SURGERY    . BACK SURGERY  2000  . COLON SURGERY     x2-blockage  . COLONOSCOPY    . ERCP    . HERNIA REPAIR  2012   x2 with mesh  . JOINT REPLACEMENT Left    shoulder  . LAPAROTOMY N/A 08/04/2014   Procedure: EXPLORATORY LAPAROTOMY, REPAIR OF PERFORATED ULCER;  Surgeon: Excell Seltzer, MD;  Location: [facility];  Service: General;  Laterality: N/A;  . LAPAROTOMY N/A 12/21/2015   Procedure: EXPLORATORY LAPAROTOMY/ REMOVAL OF MESH;  Surgeon: Coralie Keens, MD;  Location: [place];  Service: General;  Laterality: N/A;  . ORIF WRIST FRACTURE  2012   left  . REVERSE SHOULDER ARTHROPLASTY Right 12/04/2013  . REVERSE SHOULDER ARTHROPLASTY  12/04/2013   Procedure: REVERSE SHOULDER ARTHROPLASTY;  Surgeon: Nita Sells, MD;  Location: [facility] OR;  Service: Orthopedics;;  Right reverse total shoulder arthroplasty  . ROTATOR CUFF REPAIR     dr Tamera Punt  . SHOULDER ARTHROSCOPY  2012   lt and rt  . TENDON REPAIR Right 04/13/2014   Procedure: RIGHT HAND CENTRAL TENDON CENTRALIZATION OF LONG AND RING FINGERS;  Surgeon: Jolyn Nap, MD;  Location: [place];  Service: Orthopedics;  Laterality: Right;    Social History    Social History  . Marital status: Married    Spouse name: N/A  . Number of children: N/A  . Years of education: N/A   Occupational History  . Not on file.   Social History Main Topics  . Smoking status: Never Smoker  . Smokeless tobacco: Never Used  . Alcohol use 4.2 oz/week    7 Glasses of wine per week     Comment: 8 oz -12 oz of wine every   . Drug use: No  . Sexual activity: Not on file   Other Topics Concern  . Not on file   Social History Narrative  . No narrative on file   Family History  Problem Relation Age of Onset  . Stroke Mother   . Breast cancer Sister   . Ulcerative colitis Daughter   . Chronic fatigue Daughter   . Chronic fatigue Daughter       VITAL SIGNS BP 110/64   Pulse 61   Temp 97.4 F (36.3 C)   Resp 18   Ht 5\' 3"  (1.6 m)   Wt 168 lb 8 oz (76.4 kg)   SpO2 98%   BMI 29.85 kg/m   Patient's Medications  New Prescriptions   No medications on file  Previous Medications   ACETAMINOPHEN (TYLENOL) 325 MG TABLET    Take  325 mg by mouth every 6 (six) hours as needed for mild pain.    ALUM & MAG HYDROXIDE-SIMETH (MAALOX/MYLANTA) 200-200-20 MG/5ML SUSPENSION    Take 15 mLs by mouth as needed for indigestion or heartburn.   AMINO ACIDS-PROTEIN HYDROLYS (FEEDING SUPPLEMENT, PRO-STAT SUGAR FREE 64,) LIQD    Take 30 mLs by mouth 3 (three) times daily with meals.   BISACODYL (DULCOLAX) 10 MG SUPPOSITORY    Place 1 suppository (10 mg total) rectally daily as needed for moderate constipation (May repeat times one).   BUDESONIDE (RHINOCORT AQUA) 32 MCG/ACT NASAL SPRAY    Place 1 spray into both nostrils daily as needed for rhinitis.   DICLOFENAC SODIUM (VOLTAREN) 1 % GEL    Apply 1 g topically 2 (two) times daily. Apply to bilateral knees for pain   DIPHENHYDRAMINE HCL (BENADRYL PO)    Take 0.5 tablet every 6 hours as needed for itching    DIVALPROEX (DEPAKOTE SPRINKLE) 125 MG CAPSULE    Take 250 mg by mouth at bedtime.   DULOXETINE (CYMBALTA) 60 MG  CAPSULE    Take 60 mg by mouth at bedtime.    ESOMEPRAZOLE MAGNESIUM (NEXIUM 24HR PO)    Take 20 mg by mouth daily.    FLUTICASONE (FLONASE) 50 MCG/ACT NASAL SPRAY    Place 1 spray into both nostrils at bedtime.   GABAPENTIN (NEURONTIN) 300 MG CAPSULE    Take 300 mg by mouth 2 (two) times daily.   GUAIFENESIN (MUCINEX) 600 MG 12 HR TABLET    Take 600 mg by mouth daily.   LIDOCAINE (LIDODERM) 5 %    Place 1 patch onto the skin daily. Remove & Discard patch within 12 hours or as directed by MD   MAGNESIUM HYDROXIDE (MILK OF MAGNESIA) 400 MG/5ML SUSPENSION    Take 60 mLs by mouth every other day. FOR CONSTIPATION   METHADONE (DOLOPHINE) 5 MG TABLET    Take one tablet by mouth twice daily for pain   METHOCARBAMOL (ROBAXIN) 500 MG TABLET    Take 500 mg by mouth 4 (four) times daily.    MULTIPLE VITAMIN (MULTIVITAMIN) TABLET    Take 1 tablet by mouth daily.   OMEGA-3 FATTY ACIDS (FISH OIL) 1000 MG CPDR    Take 1,000 mg by mouth daily.    OXYCODONE (OXY IR/ROXICODONE) 5 MG IMMEDIATE RELEASE TABLET    Take 1-2 tablets (5-10 mg total) by mouth every 8 (eight) hours as needed. For pain   OXYGEN    Inhale into the lungs. 2-6 L/min to keep stat at 90 or above   POLYETHYLENE GLYCOL (MIRALAX / GLYCOLAX) PACKET    Take 17 g by mouth daily.    POTASSIUM CHLORIDE (KLOR-CON) 20 MEQ PACKET    Take 20 mEq by mouth daily as needed. Take 1 tablet by mouth daily as needed only when Lasix is given   RIVAROXABAN (XARELTO) 20 MG TABS TABLET    Take 20 mg by mouth at bedtime.   TEMAZEPAM (RESTORIL) 15 MG CAPSULE    Take 15 mg by mouth at bedtime.    TIMOLOL (BETIMOL) 0.5 % OPHTHALMIC SOLUTION    Place 1 drop into both eyes 2 (two) times daily.    TORSEMIDE (DEMADEX) 20 MG TABLET    Take 2 tablets by mouth once daily   UNABLE TO FIND    House Supplement - Take 4 oz by mouth 2 times daily  Modified Medications   No medications on file  Discontinued Medications  SENNA (SENOKOT) 8.6 MG TABS TABLET    Take 2 tablets (17.2  mg total) by mouth at bedtime.     SIGNIFICANT DIAGNOSTIC EXAMS       ASSESSMENT/ PLAN:    MD is aware of resident's narcotic use and is in agreement with current plan of care. We will attempt to wean resident as apropriate   Ok Edwards NP [facility]  Contact [phone] Monday through Friday 8am- 5pm  After hours call [phone]

## 2016-08-01 DIAGNOSIS — L98499 Non-pressure chronic ulcer of skin of other sites with unspecified severity: Secondary | ICD-10-CM | POA: Diagnosis not present

## 2016-08-01 DIAGNOSIS — S142XXA Injury of nerve root of cervical spine, initial encounter: Secondary | ICD-10-CM | POA: Diagnosis not present

## 2016-08-01 DIAGNOSIS — A419 Sepsis, unspecified organism: Secondary | ICD-10-CM | POA: Diagnosis not present

## 2016-08-01 DIAGNOSIS — M1712 Unilateral primary osteoarthritis, left knee: Secondary | ICD-10-CM | POA: Diagnosis not present

## 2016-08-01 DIAGNOSIS — R609 Edema, unspecified: Secondary | ICD-10-CM | POA: Diagnosis not present

## 2016-08-01 DIAGNOSIS — M1711 Unilateral primary osteoarthritis, right knee: Secondary | ICD-10-CM | POA: Diagnosis not present

## 2016-08-04 ENCOUNTER — Non-Acute Institutional Stay (SKILLED_NURSING_FACILITY): Payer: PPO | Admitting: Adult Health

## 2016-08-04 ENCOUNTER — Encounter: Payer: Self-pay | Admitting: Adult Health

## 2016-08-04 DIAGNOSIS — T8189XD Other complications of procedures, not elsewhere classified, subsequent encounter: Secondary | ICD-10-CM | POA: Diagnosis not present

## 2016-08-04 DIAGNOSIS — M545 Low back pain: Secondary | ICD-10-CM | POA: Diagnosis not present

## 2016-08-04 DIAGNOSIS — K5903 Drug induced constipation: Secondary | ICD-10-CM

## 2016-08-04 DIAGNOSIS — G894 Chronic pain syndrome: Secondary | ICD-10-CM | POA: Diagnosis not present

## 2016-08-04 DIAGNOSIS — I2721 Secondary pulmonary arterial hypertension: Secondary | ICD-10-CM | POA: Diagnosis not present

## 2016-08-04 DIAGNOSIS — T402X5A Adverse effect of other opioids, initial encounter: Secondary | ICD-10-CM | POA: Diagnosis not present

## 2016-08-04 DIAGNOSIS — I82413 Acute embolism and thrombosis of femoral vein, bilateral: Secondary | ICD-10-CM

## 2016-08-04 DIAGNOSIS — G8929 Other chronic pain: Secondary | ICD-10-CM

## 2016-08-04 DIAGNOSIS — I509 Heart failure, unspecified: Secondary | ICD-10-CM | POA: Diagnosis not present

## 2016-08-04 DIAGNOSIS — E43 Unspecified severe protein-calorie malnutrition: Secondary | ICD-10-CM | POA: Diagnosis not present

## 2016-08-04 DIAGNOSIS — F1190 Opioid use, unspecified, uncomplicated: Secondary | ICD-10-CM | POA: Diagnosis not present

## 2016-08-04 NOTE — Progress Notes (Signed)
Location:   [facility] Room Number: 105 A Place of Service:  SNF (31)   CODE STATUS: Full Code  Allergies  Allergen Reactions  . Tape Other (See Comments)    SKIN IS VERY THIN; IT BRUISES AND TEARS EASILY!! -THX    Chief Complaint  Patient presents with  . Medical Management of Chronic Issues    1 month follow up    HPI:  She is a long term resident of this facility being seen for the management of her chronic illnesses. She has been to orthopedics and has had cortisone injections to her bilateral knees. She tells me that she is feeling pretty good and is due to have an injection in her back soon. There are no nursing concerns at this time.  .   Past Medical History:  Diagnosis Date  . Anemia   . Arthritis   . CHF (congestive heart failure) ([place])   . Depression   . GERD (gastroesophageal reflux disease)   . Hyponatremia 01/2016  . Perforated chronic gastric ulcer ([place]) 08/11/2014  . Pneumonia   . Pulmonary arterial hypertension ([place]) 11/28/2014    Past Surgical History:  Procedure Laterality Date  . ABDOMINAL HYSTERECTOMY    . ABDOMINAL SURGERY    . BACK SURGERY  2000  . COLON SURGERY     x2-blockage  . COLONOSCOPY    . ERCP    . HERNIA REPAIR  2012   x2 with mesh  . JOINT REPLACEMENT Left    shoulder  . LAPAROTOMY N/A 08/04/2014   Procedure: EXPLORATORY LAPAROTOMY, REPAIR OF PERFORATED ULCER;  Surgeon: Excell Seltzer, MD;  Location: [facility];  Service: General;  Laterality: N/A;  . LAPAROTOMY N/A 12/21/2015   Procedure: EXPLORATORY LAPAROTOMY/ REMOVAL OF MESH;  Surgeon: Coralie Keens, MD;  Location: [facility];  Service: General;  Laterality: N/A;  . ORIF WRIST FRACTURE  2012   left  . REVERSE SHOULDER ARTHROPLASTY Right 12/04/2013  . REVERSE SHOULDER ARTHROPLASTY  12/04/2013   Procedure: REVERSE SHOULDER ARTHROPLASTY;  Surgeon: Nita Sells, MD;  Location: [facility] OR;  Service: Orthopedics;;  Right reverse total shoulder arthroplasty  .  ROTATOR CUFF REPAIR     dr Tamera Punt  . SHOULDER ARTHROSCOPY  2012   lt and rt  . TENDON REPAIR Right 04/13/2014   Procedure: RIGHT HAND CENTRAL TENDON CENTRALIZATION OF LONG AND RING FINGERS;  Surgeon: Jolyn Nap, MD;  Location: [facility];  Service: Orthopedics;  Laterality: Right;    Social History   Social History  . Marital status: Married    Spouse name: N/A  . Number of children: N/A  . Years of education: N/A   Occupational History  . Not on file.   Social History Main Topics  . Smoking status: Never Smoker  . Smokeless tobacco: Never Used  . Alcohol use 4.2 oz/week    7 Glasses of wine per week     Comment: 8 oz -12 oz of wine every   . Drug use: No  . Sexual activity: Not on file   Other Topics Concern  . Not on file   Social History Narrative  . No narrative on file   Family History  Problem Relation Age of Onset  . Stroke Mother   . Breast cancer Sister   . Ulcerative colitis Daughter   . Chronic fatigue Daughter   . Chronic fatigue Daughter       VITAL SIGNS BP 125/67   Pulse 60  Temp 97.8 F (36.6 C)   Resp 16   Ht 5' 3" (1.6 m)   Wt 167 lb 4.8 oz (75.9 kg)   SpO2 90%   BMI 29.64 kg/m   Patient's Medications  New Prescriptions   No medications on file  Previous Medications   ACETAMINOPHEN (TYLENOL) 325 MG TABLET    Take 325 mg by mouth every 6 (six) hours as needed for mild pain.    ALUM & MAG HYDROXIDE-SIMETH (MAALOX/MYLANTA) 200-200-20 MG/5ML SUSPENSION    Take 15 mLs by mouth as needed for indigestion or heartburn.   AMINO ACIDS-PROTEIN HYDROLYS (FEEDING SUPPLEMENT, PRO-STAT SUGAR FREE 64,) LIQD    Take 30 mLs by mouth 3 (three) times daily with meals.   BISACODYL (DULCOLAX) 10 MG SUPPOSITORY    Place 1 suppository (10 mg total) rectally daily as needed for moderate constipation (May repeat times one).   BUDESONIDE (RHINOCORT AQUA) 32 MCG/ACT NASAL SPRAY    Place 1 spray into both nostrils daily as needed for  rhinitis.   DICLOFENAC SODIUM (VOLTAREN) 1 % GEL    Apply 1 g topically 2 (two) times daily. Apply to bilateral knees for pain   DIPHENHYDRAMINE HCL (BENADRYL PO)    Take 0.5 tablet every 6 hours as needed for itching    DIVALPROEX (DEPAKOTE SPRINKLE) 125 MG CAPSULE    Take 250 mg by mouth at bedtime.   DULOXETINE (CYMBALTA) 60 MG CAPSULE    Take 60 mg by mouth at bedtime.    ESOMEPRAZOLE MAGNESIUM (NEXIUM 24HR PO)    Take 20 mg by mouth daily.    FLUTICASONE (FLONASE) 50 MCG/ACT NASAL SPRAY    Place 1 spray into both nostrils at bedtime.   GABAPENTIN (NEURONTIN) 300 MG CAPSULE    Take 300 mg by mouth 2 (two) times daily.   GUAIFENESIN (MUCINEX) 600 MG 12 HR TABLET    Take 600 mg by mouth daily.   KETOTIFEN FUMARATE (ZADITOR OP)    Instill 1 drop in both eyes two times daily for Allergy   LIDOCAINE (LIDODERM) 5 %    Place 1 patch onto the skin daily. Remove & Discard patch within 12 hours or as directed by MD   MAGNESIUM HYDROXIDE (MILK OF MAGNESIA) 400 MG/5ML SUSPENSION    Take 60 mLs by mouth every other day. FOR CONSTIPATION   METHADONE (DOLOPHINE) 5 MG TABLET    Take one tablet by mouth twice daily for pain   METHOCARBAMOL (ROBAXIN) 500 MG TABLET    Take 500 mg by mouth 4 (four) times daily.    MULTIPLE VITAMIN (MULTIVITAMIN) TABLET    Take 1 tablet by mouth daily.   OMEGA-3 FATTY ACIDS (FISH OIL) 1000 MG CPDR    Take 1,000 mg by mouth daily.    OXYCODONE (OXY IR/ROXICODONE) 5 MG IMMEDIATE RELEASE TABLET    Take 1-2 tablets (5-10 mg total) by mouth every 8 (eight) hours as needed. For pain   OXYGEN    Inhale into the lungs. 2-6 L/min to keep stat at 90 or above   POLYETHYLENE GLYCOL (MIRALAX / GLYCOLAX) PACKET    Take 17 g by mouth daily.    POTASSIUM CHLORIDE (KLOR-CON) 20 MEQ PACKET    Take 20 mEq by mouth daily as needed. Take 1 tablet by mouth daily as needed only when Lasix is given   RIVAROXABAN (XARELTO) 20 MG TABS TABLET    Take 20 mg by mouth at bedtime.   TEMAZEPAM (RESTORIL) 15 MG  CAPSULE      Take 15 mg by mouth at bedtime.    TIMOLOL (BETIMOL) 0.5 % OPHTHALMIC SOLUTION    Place 1 drop into both eyes 2 (two) times daily.    TORSEMIDE (DEMADEX) 20 MG TABLET    Take 2 tablets by mouth once daily   UNABLE TO FIND    House Supplement - Take 4 oz by mouth 2 times daily  Modified Medications   No medications on file  Discontinued Medications   No medications on file     SIGNIFICANT DIAGNOSTIC EXAMS  01-20-16; chest x-ray: 1. Cardiomegaly and mild interstitial edema. 2. Improved aeration.  01-21-16: ct of abdomen and pelvis: 1. Apparent wall thickening at the rectum could reflect mild proctitis. 2. Small bilateral pleural effusions noted. Bibasilar airspace opacities may reflect atelectasis or possibly infection. 3. Large right-sided hiatal hernia, containing most of the stomach. 4. Mild soft tissue inflammation suggested about the bladder. Would correlate for any evidence of cystitis. 5. Nonspecific soft tissue inflammation suggested about the transverse colon. 6. Mild inflammation about the gallbladder is nonspecific, given the underlying diffuse soft tissue inflammation about the abdomen. 7. Very large anterior abdominal wall defect again noted, with packing material and underlying soft tissue thickening. 8. Scattered diverticulosis along the distal sigmoid colon, without evidence of diverticulitis. 9. Scattered aortic atherosclerosis noted. 10. Right convex thoracolumbar scoliosis noted.   01-21-16 ct of neck: 1. Findings consistent with right parotiditis. No abscess identified. No significant lymphadenopathy. 2. Partially visualized small right pleural effusion and dependent right upper lobe opacity which may represent associated pneumonia or atelectasis.  07-03-16: right hip x-ray:No evidence of fracture or dislocation.   07-03-16: lumbar spine x-ray: No evidence of fracture or subluxation along the lumbar spine. Right convex thoracolumbar scoliosis again noted, with  underlying degenerative change.   LABS REVIEWED:   12-27-15: pre-albumin 6.9 01-20-16; wbc 13.8; hgb 10.2; hct 32.6; mcv 95.0; plt 447; gluocose 109; bun 14; creat 0.62; k+ 4.7; na++ 131; alk phos 130; albumin 1.9 01-23-16: wbc 15.1; hgb 8.7; hct 27.2; mcv 93.6; plt 381; glucose 92; bun 8; creat 0.62; k+ 3.8; na++ 134 02-15-16: wbc 9.5; hgb 9.9; hct 31; plt 379; glucose 110; bun 9; creat 0.6; k+ 4.3; na++ 135      Review of Systems  Unable to perform ROS: Other  poor historian    Physical Exam  Constitutional: No distress.  Frail   Eyes: Conjunctivae are normal.  Neck: Neck supple. No JVD present. No thyromegaly present.  Cardiovascular: Normal rate, regular rhythm and intact distal pulses.   Murmur heard. Respiratory: Effort normal and breath sounds normal. No respiratory distress. She has no wheezes.  GI: Soft. Bowel sounds are normal. She exhibits no distension. There is no tenderness.  Abdominal wound: healthy wound bed without signs of infection Musculoskeletal: She exhibits lower extremity edema   Able to move all extremities   Lymphadenopathy:    She has no cervical adenopathy.  Neurological: She is alert.  Skin: Skin is warm and dry. She is not diaphoretic.  Psychiatric: She has a normal mood and affect.    ASSESSMENT/ PLAN:  1. Chronic pain syndrome: has chronic bilateral low back pain: is on chronic narcotic therapy: will continue methadone 5 mg twice daily robaxin 500 mg four  times daily neurontin 300 mg twice daily  lidoderm patch; and voltaren gel 1 gm twice daily to knees oxycodone 5 or 10 mg every 8 hours as needed    Has  benadryl 12.5 mg every 6 hours   as needed for itching. She has recently received cortisone injections to both knees.   2. Constipation: will continue   miralax 17 gm  daily and MOM 60 cc every other day    3. Status post laparotomy without  removal of mesh: has large abdominal wound: will continue current treatment is followed by wound doctor     4. Insomnia: will continue restoril 15 mg nightly   5. Bilateral DVT: femoral veins: will continue xarelto 20 mg daily   6. CHF: will change to demadex 40 mg daily with k+ 20 meq daily is  02 dependent   7. gerd: will continue nexium 20 mg daily   8. Depression with anxiety: will continue cymbalta 60 mg daily takes depakote 250 mg nightly to stabilize mood.   9. Glaucoma: will continue timolol to both eyes twice daily   10. Allergic rhinits: will continue flonase and mucinex daily   11. Pulmonary arterial hypertension: b/p 125/67    Will check cbc; cmp depakote level    MD is aware of resident's narcotic use and is in agreement with current plan of care. We will attempt to wean resident as apropriate      NP [facility]  Contact [phone] Monday through Friday 8am- 5pm  After hours call [phone]   

## 2016-08-07 DIAGNOSIS — R1084 Generalized abdominal pain: Secondary | ICD-10-CM | POA: Diagnosis not present

## 2016-08-07 DIAGNOSIS — S31102A Unspecified open wound of abdominal wall, epigastric region without penetration into peritoneal cavity, initial encounter: Secondary | ICD-10-CM | POA: Diagnosis not present

## 2016-08-07 DIAGNOSIS — T8579XS Infection and inflammatory reaction due to other internal prosthetic devices, implants and grafts, sequela: Secondary | ICD-10-CM | POA: Diagnosis not present

## 2016-08-07 LAB — CBC AND DIFFERENTIAL
HCT: 34 % — AB (ref 36–46)
Hemoglobin: 11.1 g/dL — AB (ref 12.0–16.0)
Neutrophils Absolute: 8 /uL
PLATELETS: 226 10*3/uL (ref 150–399)
WBC: 11 10*3/mL

## 2016-08-08 DIAGNOSIS — L98499 Non-pressure chronic ulcer of skin of other sites with unspecified severity: Secondary | ICD-10-CM | POA: Diagnosis not present

## 2016-08-09 ENCOUNTER — Encounter: Payer: Self-pay | Admitting: Adult Health

## 2016-08-09 ENCOUNTER — Non-Acute Institutional Stay (SKILLED_NURSING_FACILITY): Payer: PPO | Admitting: Adult Health

## 2016-08-09 DIAGNOSIS — H8301 Labyrinthitis, right ear: Secondary | ICD-10-CM | POA: Diagnosis not present

## 2016-08-09 NOTE — Progress Notes (Signed)
Location:   [facility] Room Number: 105 A Place of Service:  SNF (31)   CODE STATUS: Full Code  Allergies  Allergen Reactions  . Tape Other (See Comments)    SKIN IS VERY THIN; IT BRUISES AND TEARS EASILY!! -THX    Chief Complaint  Patient presents with  . Acute Visit    Right Ear pain    HPI:  She is complaining of right ear pain and states she has some bleeding present. She denies putting anything in her ear while talking to me had her finger in her ear. There are no reports of fevers present. She denies any vertigo.    Past Medical History:  Diagnosis Date  . Anemia   . Arthritis   . CHF (congestive heart failure) ([facility])   . Depression   . GERD (gastroesophageal reflux disease)   . Hyponatremia 01/2016  . Perforated chronic gastric ulcer ([facility]) 08/11/2014  . Pneumonia   . Pulmonary arterial hypertension ([facility]) 11/28/2014    Past Surgical History:  Procedure Laterality Date  . ABDOMINAL HYSTERECTOMY    . ABDOMINAL SURGERY    . BACK SURGERY  2000  . COLON SURGERY     x2-blockage  . COLONOSCOPY    . ERCP    . HERNIA REPAIR  2012   x2 with mesh  . JOINT REPLACEMENT Left    shoulder  . LAPAROTOMY N/A 08/04/2014   Procedure: EXPLORATORY LAPAROTOMY, REPAIR OF PERFORATED ULCER;  Surgeon: Glenna Fellows, MD;  Location: [facility];  Service: General;  Laterality: N/A;  . LAPAROTOMY N/A 12/21/2015   Procedure: EXPLORATORY LAPAROTOMY/ REMOVAL OF MESH;  Surgeon: Abigail Miyamoto, MD;  Location: [facility] OR;  Service: General;  Laterality: N/A;  . ORIF WRIST FRACTURE  2012   left  . REVERSE SHOULDER ARTHROPLASTY Right 12/04/2013  . REVERSE SHOULDER ARTHROPLASTY  12/04/2013   Procedure: REVERSE SHOULDER ARTHROPLASTY;  Surgeon: Mable Paris, MD;  Location: [facility] OR;  Service: Orthopedics;;  Right reverse total shoulder arthroplasty  . ROTATOR CUFF REPAIR     dr [address] Filter  . SHOULDER ARTHROSCOPY  2012   lt and rt  . TENDON REPAIR Right 04/13/2014   Procedure:  RIGHT HAND CENTRAL TENDON CENTRALIZATION OF LONG AND RING FINGERS;  Surgeon: Jodi Marble, MD;  Location: [facility];  Service: Orthopedics;  Laterality: Right;    Social History   Social History  . Marital status: Married    Spouse name: N/A  . Number of children: N/A  . Years of education: N/A   Occupational History  . Not on file.   Social History Main Topics  . Smoking status: Never Smoker  . Smokeless tobacco: Never Used  . Alcohol use 4.2 oz/week    7 Glasses of wine per week     Comment: 8 oz -12 oz of wine every   . Drug use: No  . Sexual activity: Not on file   Other Topics Concern  . Not on file   Social History Narrative  . No narrative on file   Family History  Problem Relation Age of Onset  . Stroke Mother   . Breast cancer Sister   . Ulcerative colitis Daughter   . Chronic fatigue Daughter   . Chronic fatigue Daughter       VITAL SIGNS BP 121/79   Pulse 83   Temp 98.6 F (37 C)   Resp 16   Ht 5\' 3"  (1.6 m)   Wt 156 lb 11.2  oz (71.1 kg)   SpO2 96%   BMI 27.76 kg/m   Patient's Medications  New Prescriptions   No medications on file  Previous Medications   ACETAMINOPHEN (TYLENOL) 325 MG TABLET    Take 325 mg by mouth every 6 (six) hours as needed for mild pain.    ALUM & MAG HYDROXIDE-SIMETH (MAALOX/MYLANTA) 200-200-20 MG/5ML SUSPENSION    Take 15 mLs by mouth as needed for indigestion or heartburn.   AMINO ACIDS-PROTEIN HYDROLYS (FEEDING SUPPLEMENT, PRO-STAT SUGAR FREE 64,) LIQD    Take 30 mLs by mouth 3 (three) times daily with meals.   BISACODYL (DULCOLAX) 10 MG SUPPOSITORY    Place 1 suppository (10 mg total) rectally daily as needed for moderate constipation (May repeat times one).   DICLOFENAC SODIUM (VOLTAREN) 1 % GEL    Apply 1 g topically 2 (two) times daily. Apply to bilateral knees for pain   DIPHENHYDRAMINE HCL (BENADRYL PO)    Take 0.5 tablet every 6 hours as needed for itching    DIVALPROEX (DEPAKOTE SPRINKLE)  125 MG CAPSULE    Take 250 mg by mouth at bedtime.   DULOXETINE (CYMBALTA) 60 MG CAPSULE    Take 60 mg by mouth at bedtime.    ESOMEPRAZOLE MAGNESIUM (NEXIUM 24HR PO)    Take 20 mg by mouth daily.    FLUTICASONE (FLONASE) 50 MCG/ACT NASAL SPRAY    Place 1 spray into both nostrils at bedtime.   GABAPENTIN (NEURONTIN) 300 MG CAPSULE    Take 300 mg by mouth 2 (two) times daily.   GUAIFENESIN (MUCINEX) 600 MG 12 HR TABLET    Take 600 mg by mouth daily.   KETOTIFEN FUMARATE (ZADITOR OP)    Instill 1 drop in both eyes two times daily for Allergy   LIDOCAINE (LIDODERM) 5 %    Place 1 patch onto the skin daily. Remove & Discard patch within 12 hours or as directed by MD   MAGNESIUM HYDROXIDE (MILK OF MAGNESIA) 400 MG/5ML SUSPENSION    Take 60 mLs by mouth every other day. FOR CONSTIPATION   METHADONE (DOLOPHINE) 5 MG TABLET    Take one tablet by mouth twice daily for pain   METHOCARBAMOL (ROBAXIN) 500 MG TABLET    Take 500 mg by mouth 4 (four) times daily.    MULTIPLE VITAMIN (MULTIVITAMIN) TABLET    Take 1 tablet by mouth daily.   OMEGA-3 FATTY ACIDS (FISH OIL) 1000 MG CPDR    Take 1,000 mg by mouth daily.    OXYCODONE (OXY IR/ROXICODONE) 5 MG IMMEDIATE RELEASE TABLET    Take 1-2 tablets (5-10 mg total) by mouth every 8 (eight) hours as needed. For pain   OXYGEN    Inhale into the lungs. 2-6 L/min to keep stat at 90 or above   POLYETHYLENE GLYCOL (MIRALAX / GLYCOLAX) PACKET    Take 17 g by mouth daily.    POTASSIUM CHLORIDE (KLOR-CON) 20 MEQ PACKET    Take 20 mEq by mouth daily as needed. Take 1 tablet by mouth daily as needed only when Lasix is given   RIVAROXABAN (XARELTO) 20 MG TABS TABLET    Take 20 mg by mouth at bedtime.   TEMAZEPAM (RESTORIL) 15 MG CAPSULE    Take 15 mg by mouth at bedtime.    TIMOLOL (BETIMOL) 0.5 % OPHTHALMIC SOLUTION    Place 1 drop into both eyes 2 (two) times daily.    TORSEMIDE (DEMADEX) 20 MG TABLET    Take 2 tablets by  mouth once daily   UNABLE TO FIND    House  Supplement - Take 4 oz by mouth 2 times daily  Modified Medications   No medications on file  Discontinued Medications   BUDESONIDE (RHINOCORT AQUA) 32 MCG/ACT NASAL SPRAY    Place 1 spray into both nostrils daily as needed for rhinitis.     SIGNIFICANT DIAGNOSTIC EXAMS   01-20-16; chest x-ray: 1. Cardiomegaly and mild interstitial edema. 2. Improved aeration.  01-21-16: ct of abdomen and pelvis: 1. Apparent wall thickening at the rectum could reflect mild proctitis. 2. Small bilateral pleural effusions noted. Bibasilar airspace opacities may reflect atelectasis or possibly infection. 3. Large right-sided hiatal hernia, containing most of the stomach. 4. Mild soft tissue inflammation suggested about the bladder. Would correlate for any evidence of cystitis. 5. Nonspecific soft tissue inflammation suggested about the transverse colon. 6. Mild inflammation about the gallbladder is nonspecific, given the underlying diffuse soft tissue inflammation about the abdomen. 7. Very large anterior abdominal wall defect again noted, with packing material and underlying soft tissue thickening. 8. Scattered diverticulosis along the distal sigmoid colon, without evidence of diverticulitis. 9. Scattered aortic atherosclerosis noted. 10. Right convex thoracolumbar scoliosis noted.   01-21-16 ct of neck: 1. Findings consistent with right parotiditis. No abscess identified. No significant lymphadenopathy. 2. Partially visualized small right pleural effusion and dependent right upper lobe opacity which may represent associated pneumonia or atelectasis.  07-03-16: right hip x-ray:No evidence of fracture or dislocation.   07-03-16: lumbar spine x-ray: No evidence of fracture or subluxation along the lumbar spine. Right convex thoracolumbar scoliosis again noted, with underlying degenerative change.   LABS REVIEWED:   12-27-15: pre-albumin 6.9 01-20-16; wbc 13.8; hgb 10.2; hct 32.6; mcv 95.0; plt 447; gluocose  109; bun 14; creat 0.62; k+ 4.7; na++ 131; alk phos 130; albumin 1.9 01-23-16: wbc 15.1; hgb 8.7; hct 27.2; mcv 93.6; plt 381; glucose 92; bun 8; creat 0.62; k+ 3.8; na++ 134 02-15-16: wbc 9.5; hgb 9.9; hct 31; plt 379; glucose 110; bun 9; creat 0.6; k+ 4.3; na++ 135      Review of Systems  Unable to perform ROS: Other  poor historian    Physical Exam  Constitutional: No distress.  Frail   Eyes: Conjunctivae are normal.  Right ear tympanic membrane slightly bulging has dried blood in canal  Neck: Neck supple. No JVD present. No thyromegaly present.  Cardiovascular: Normal rate, regular rhythm and intact distal pulses.   Murmur heard. Respiratory: Effort normal and breath sounds normal. No respiratory distress. She has no wheezes.  GI: Soft. Bowel sounds are normal. She exhibits no distension. There is no tenderness.  Abdominal wound: healthy wound bed without signs of infection Musculoskeletal: She exhibits lower extremity edema   Able to move all extremities   Lymphadenopathy:    She has no cervical adenopathy.  Neurological: She is alert.  Skin: Skin is warm and dry. She is not diaphoretic.  Psychiatric: She has a normal mood and affect.    ASSESSMENT/ PLAN:  1. Otis interna right ear: will begin cortisporin otic 4 drops to right ear three times daily for 10 days   Will have her return to Dr. Asa Lente for her pain management. She is wanting to have possible injections in her back    MD is aware of resident's narcotic use and is in agreement with current plan of care. We will attempt to wean resident as apropriate   Ok Edwards NP [facility]  Contact  [phone] Monday through Friday 8am- 5pm  After hours call [phone]

## 2016-08-15 DIAGNOSIS — L98499 Non-pressure chronic ulcer of skin of other sites with unspecified severity: Secondary | ICD-10-CM | POA: Diagnosis not present

## 2016-08-18 DIAGNOSIS — A419 Sepsis, unspecified organism: Secondary | ICD-10-CM | POA: Diagnosis not present

## 2016-08-22 DIAGNOSIS — M549 Dorsalgia, unspecified: Secondary | ICD-10-CM | POA: Diagnosis not present

## 2016-08-22 DIAGNOSIS — T8189XA Other complications of procedures, not elsewhere classified, initial encounter: Secondary | ICD-10-CM | POA: Diagnosis not present

## 2016-08-22 DIAGNOSIS — L98499 Non-pressure chronic ulcer of skin of other sites with unspecified severity: Secondary | ICD-10-CM | POA: Diagnosis not present

## 2016-08-22 DIAGNOSIS — S21109A Unspecified open wound of unspecified front wall of thorax without penetration into thoracic cavity, initial encounter: Secondary | ICD-10-CM | POA: Diagnosis not present

## 2016-08-22 DIAGNOSIS — R100 Acute abdomen: Secondary | ICD-10-CM | POA: Diagnosis not present

## 2016-08-25 ENCOUNTER — Other Ambulatory Visit: Payer: Self-pay

## 2016-08-25 DIAGNOSIS — M1990 Unspecified osteoarthritis, unspecified site: Secondary | ICD-10-CM | POA: Diagnosis not present

## 2016-08-25 DIAGNOSIS — M549 Dorsalgia, unspecified: Secondary | ICD-10-CM | POA: Diagnosis not present

## 2016-08-25 DIAGNOSIS — M533 Sacrococcygeal disorders, not elsewhere classified: Secondary | ICD-10-CM | POA: Diagnosis not present

## 2016-08-25 DIAGNOSIS — M01X0 Direct infection of unspecified joint in infectious and parasitic diseases classified elsewhere: Secondary | ICD-10-CM | POA: Diagnosis not present

## 2016-08-25 MED ORDER — METHADONE HCL 5 MG PO TABS: ORAL_TABLET | ORAL | 0 refills | Status: DC

## 2016-08-25 NOTE — Telephone Encounter (Signed)
RX faxed to [facility] @ [phone], phone number [phone] 

## 2016-08-29 DIAGNOSIS — L98499 Non-pressure chronic ulcer of skin of other sites with unspecified severity: Secondary | ICD-10-CM | POA: Diagnosis not present

## 2016-08-31 ENCOUNTER — Non-Acute Institutional Stay (SKILLED_NURSING_FACILITY): Payer: PPO | Admitting: Internal Medicine

## 2016-08-31 ENCOUNTER — Encounter: Payer: Self-pay | Admitting: Internal Medicine

## 2016-08-31 DIAGNOSIS — F329 Major depressive disorder, single episode, unspecified: Secondary | ICD-10-CM

## 2016-08-31 DIAGNOSIS — E43 Unspecified severe protein-calorie malnutrition: Secondary | ICD-10-CM

## 2016-08-31 DIAGNOSIS — I82413 Acute embolism and thrombosis of femoral vein, bilateral: Secondary | ICD-10-CM

## 2016-08-31 DIAGNOSIS — F32A Depression, unspecified: Secondary | ICD-10-CM

## 2016-08-31 DIAGNOSIS — G894 Chronic pain syndrome: Secondary | ICD-10-CM | POA: Diagnosis not present

## 2016-08-31 DIAGNOSIS — T8189XD Other complications of procedures, not elsewhere classified, subsequent encounter: Secondary | ICD-10-CM | POA: Diagnosis not present

## 2016-08-31 DIAGNOSIS — I509 Heart failure, unspecified: Secondary | ICD-10-CM

## 2016-08-31 NOTE — Progress Notes (Signed)
DATE:  August 31, 2016  Location:   [facility] Room Number: 105 A Place of Service: SNF (31)   Extended Emergency Contact Information Primary Emergency Contact: Dickard,Bill Address: [address]          [place], [place] [address] [place] Phone: [phone] Mobile Phone: [phone] Relation: Spouse Secondary Emergency Contact: Faythe Ghee Address: [address]          [place], [place] [address] Johnnet[place] L[place] of [place] Phone: [phone] Mobile Phone: [phone] Relation: Sister  Advanced Directive information Does Patient Have a Medical Advance Directive?: Yes, Would patient like information on creating a medical advance directive?: No - Patient declined, Type of Advance Directive: Out of facility DNR (pink MOST or yellow form), Pre-existing out of facility DNR order (yellow form or pink MOST form): Yellow form placed in chart (order not valid for inpatient use);Pink MOST form placed in chart (order not valid for inpatient use), Does patient want to make changes to medical advance directive?: No - Patient declined  Chief Complaint  Patient presents with  . Medical Management of Chronic Issues    1 month follow up    HPI:  83 yo female long term resident seen today for f/u. She has no concerns. Her abdominal wound is healing. No nursing issues. No falls. Appetite ok and sleeps well.  Chronic pain syndrome - stable on methadone 5 mg twice daily; robaxin 500 mg four  times daily; neurontin 300 mg twice daily;  lidoderm patch; and voltaren gel 1 gm twice daily to knees; oxycodone 5 or 10 mg every 8 hours as needed; she takes benadryl 12.5 mg every 6 hours as needed for itching. She has received cortisone injections to both knees. She does receive benefit from narcotic meds (improved QOL)  Chronic Constipation - stable on  miralax 17 gm  daily and MOM 60 cc every other day    Large abdominal wound - Status post laparotomy with removal of mesh;  followed by facility wound care provider. She gets prostat per facility protocol. Albumin 2.6  Bilateral DVT of femoral veins - stable on xarelto 20 mg daily   Chronic CHF/pulmonary HTN - stable on demadex 40 mg daily with k+ 20 meq daily. She is O2 dependent  Depression with anxiety - mood stable on cymbalta 60 mg daily; depakote 250 mg nightly  Past Medical History:  Diagnosis Date  . Anemia   . Arthritis   . CHF (congestive heart failure) ([place])   . Depression   . GERD (gastroesophageal reflux disease)   . Hyponatremia 01/2016  . Perforated chronic gastric ulcer ([place]) 08/11/2014  . Pneumonia   . Pulmonary arterial hypertension ([place]) 11/28/2014    Past Surgical History:  Procedure Laterality Date  . ABDOMINAL HYSTERECTOMY    . ABDOMINAL SURGERY    . BACK SURGERY  2000  . COLON SURGERY     x2-blockage  . COLONOSCOPY    . ERCP    . HERNIA REPAIR  2012   x2 with mesh  . JOINT REPLACEMENT Left    shoulder  . LAPAROTOMY N/A 08/04/2014   Procedure: EXPLORATORY LAPAROTOMY, REPAIR OF PERFORATED ULCER;  Surgeon: Excell Seltzer, MD;  [facility]: [facility];  Service: General;  Laterality: N/A;  . LAPAROTOMY N/A 12/21/2015   Procedure: EXPLORATORY LAPAROTOMY/ REMOVAL OF MESH;  Surgeon: Coralie Keens, MD;  Location: [facility];  Service: General;  Laterality: N/A;  . ORIF WRIST FRACTURE  2012   left  .  REVERSE SHOULDER ARTHROPLASTY Right 12/04/2013  . REVERSE SHOULDER ARTHROPLASTY  12/04/2013   Procedure: REVERSE SHOULDER ARTHROPLASTY;  Surgeon: Nita Sells, MD;  Location: [facility];  Service: Orthopedics;;  Right reverse total shoulder arthroplasty  . ROTATOR CUFF REPAIR     dr Tamera Punt  . SHOULDER ARTHROSCOPY  2012   lt and rt  . TENDON REPAIR Right 04/13/2014   Procedure: RIGHT HAND CENTRAL TENDON CENTRALIZATION OF LONG AND RING FINGERS;  Surgeon: Jolyn Nap, MD;  Location: [facility];  Service: Orthopedics;  Laterality: Right;    Patient Care  Team: Gildardo Cranker, DO as PCP - General (Internal Medicine) Nyoka Cowden Phylis Bougie, NP as Nurse Practitioner ([place]) [facility], [place] ([place])  Social History   Social History  . Marital status: Married    Spouse name: N/A  . Number of children: N/A  . Years of education: N/A   Occupational History  . Not on file.   Social History Main Topics  . Smoking status: Never Smoker  . Smokeless tobacco: Never Used  . Alcohol use 4.2 oz/week    7 Glasses of wine per week     Comment: 8 oz -12 oz of wine every   . Drug use: No  . Sexual activity: Not on file   Other Topics Concern  . Not on file   Social History Narrative  . No narrative on file     reports that she has never smoked. She has never used smokeless tobacco. She reports that she drinks about 4.2 oz of alcohol per week . She reports that she does not use drugs.  Family History  Problem Relation Age of Onset  . Stroke Mother   . Breast cancer Sister   . Ulcerative colitis Daughter   . Chronic fatigue Daughter   . Chronic fatigue Daughter    Family Status  Relation Status  . Mother Deceased  . Father Deceased at age 54       accident  . Sister Alive  . Brother Alive  . Sister Alive  . Sister Alive  . Brother Alive  . Son Alive  . Daughter Alive  . Daughter Alive  . Son Alive    Immunization History  Administered Date(s) Administered  . Influenza Split 02/03/2015    Allergies  Allergen Reactions  . Tape Other (See Comments)    SKIN IS VERY THIN; IT BRUISES AND TEARS EASILY!! -THX    Medications: Patient's Medications  New Prescriptions   No medications on file  Previous Medications   ACETAMINOPHEN (TYLENOL) 325 MG TABLET    Take 325 mg by mouth every 6 (six) hours as needed for mild pain.    AMINO ACIDS-PROTEIN HYDROLYS (FEEDING SUPPLEMENT, PRO-STAT SUGAR FREE 64,) LIQD    Take 30 mLs by mouth 3 (three) times daily with meals.   BISACODYL (DULCOLAX) 10 MG  SUPPOSITORY    Place 1 suppository (10 mg total) rectally daily as needed for moderate constipation (May repeat times one).   DICLOFENAC SODIUM (VOLTAREN) 1 % GEL    Apply 1 g topically 2 (two) times daily. Apply to bilateral knees for pain   DIPHENHYDRAMINE HCL (BENADRYL PO)    Take 0.5 tablet every 6 hours as needed for itching    DIVALPROEX (DEPAKOTE SPRINKLE) 125 MG CAPSULE    Give 3 tablets by mouth at bedtime   DULOXETINE (CYMBALTA) 60 MG CAPSULE    Take 60 mg by mouth at bedtime.  FLUTICASONE (FLONASE) 50 MCG/ACT NASAL SPRAY    Place 1 spray into both nostrils at bedtime.   GABAPENTIN (NEURONTIN) 300 MG CAPSULE    Take 300 mg by mouth 2 (two) times daily.   GUAIFENESIN (MUCINEX) 600 MG 12 HR TABLET    Take 600 mg by mouth daily.   KETOTIFEN FUMARATE (ZADITOR OP)    Instill 1 drop in both eyes two times daily for Allergy   LIDOCAINE (LIDODERM) 5 %    Place 1 patch onto the skin daily. Remove & Discard patch within 12 hours or as directed by MD   MAGNESIUM HYDROXIDE (MILK OF MAGNESIA) 400 MG/5ML SUSPENSION    Take 60 mLs by mouth every other day. FOR CONSTIPATION   METHADONE (DOLOPHINE) 5 MG TABLET    Take one tablet by mouth twice daily for pain   METHOCARBAMOL (ROBAXIN) 500 MG TABLET    Take 500 mg by mouth 4 (four) times daily.    MULTIPLE VITAMIN (MULTIVITAMIN) TABLET    Take 1 tablet by mouth daily.   OMEGA-3 FATTY ACIDS (FISH OIL) 1000 MG CPDR    Take 1,000 mg by mouth daily.    OMEPRAZOLE (PRILOSEC) 20 MG CAPSULE    Take 20 mg by mouth daily.   OXYCODONE (OXY IR/ROXICODONE) 5 MG IMMEDIATE RELEASE TABLET    Take 1-2 tablets (5-10 mg total) by mouth every 8 (eight) hours as needed. For pain   OXYGEN    Inhale into the lungs. 2-6 L/min to keep stat at 90 or above   POLYETHYLENE GLYCOL (MIRALAX / GLYCOLAX) PACKET    Take 17 g by mouth daily.    POTASSIUM CHLORIDE (KLOR-CON) 20 MEQ PACKET    Take 20 mEq by mouth daily as needed. Take 1 tablet by mouth daily as needed only when Lasix is  given   RIVAROXABAN (XARELTO) 20 MG TABS TABLET    Take 20 mg by mouth at bedtime.   TEMAZEPAM (RESTORIL) 15 MG CAPSULE    Take 15 mg by mouth at bedtime.    TIMOLOL (BETIMOL) 0.5 % OPHTHALMIC SOLUTION    Place 1 drop into both eyes 2 (two) times daily.    TORSEMIDE (DEMADEX) 20 MG TABLET    Take 2 tablets by mouth once daily   UNABLE TO FIND    House Supplement - Take 4 oz by mouth 2 times daily  Modified Medications   No medications on file  Discontinued Medications   ALUM & MAG HYDROXIDE-SIMETH (MAALOX/MYLANTA) 200-200-20 MG/5ML SUSPENSION    Take 15 mLs by mouth as needed for indigestion or heartburn.   ESOMEPRAZOLE MAGNESIUM (NEXIUM 24HR PO)    Take 20 mg by mouth daily.     Review of Systems  Skin: Positive for wound.  All other systems reviewed and are negative.   Vitals:   08/31/16 1132  BP: (!) 83/49  Pulse: 78  Resp: 16  Temp: 98.1 F (36.7 C)  SpO2: 93%  Weight: 171 lb 4.8 oz (77.7 kg)  Height: 5\' 3"  (1.6 m)   Body mass index is 30.34 kg/m.  Physical Exam  Constitutional: She is oriented to person, place, and time. She appears well-developed.  Sitting up in bed in NAD, frail appearing  HENT:  Mouth/Throat: Oropharynx is clear and moist. No oropharyngeal exudate.  MMM; no oral thrush  Eyes: Pupils are equal, round, and reactive to light. No scleral icterus.  Neck: Neck supple. Carotid bruit is not present. No tracheal deviation present. No thyromegaly present.  Cardiovascular: Normal rate, regular rhythm and intact distal pulses.  Exam reveals no gallop and no friction rub.   Murmur (1/6 SEM) heard. No LE edema b/l. no calf TTP.   Pulmonary/Chest: Effort normal and breath sounds normal. No stridor. No respiratory distress. She has no wheezes. She has no rales.  Abdominal: Soft. Normal appearance and bowel sounds are normal. She exhibits no distension and no mass. There is no hepatomegaly. There is no tenderness. There is no rigidity, no rebound and no guarding.  No hernia.  Wound dsg c/d/i  Musculoskeletal: She exhibits edema.  Lymphadenopathy:    She has no cervical adenopathy.  Neurological: She is alert and oriented to person, place, and time.  Skin: Skin is warm and dry. No rash noted.  Psychiatric: She has a normal mood and affect. Her behavior is normal. Thought content normal.     Labs reviewed: Abstract on 08/08/2016  Component Date Value Ref Range Status  . Hemoglobin 08/07/2016 11.1* 12.0 - 16.0 g/dL Final  . HCT 08/07/2016 34* 36 - 46 % Final  . Neutrophils Absolute 08/07/2016 8  /L Final  . Platelets 08/07/2016 226  150 - 399 K/L Final  . WBC 08/07/2016 11.0  10^3/mL Final  Nursing Home on 06/23/2016  Component Date Value Ref Range Status  . Glucose 05/30/2016 112  mg/dL Final  . BUN 05/30/2016 12  4 - 21 mg/dL Final  . Creatinine 05/30/2016 0.6  0.5 - 1.1 mg/dL Final  . Potassium 05/30/2016 4.3  3.4 - 5.3 mmol/L Final  . Sodium 05/30/2016 145  137 - 147 mmol/L Final    No results found.   Assessment/Plan   ICD-10-CM   1. Surgical wound, non healing, subsequent encounter T81.89XD   2. Chronic pain syndrome G89.4   3. Chronic congestive heart failure, unspecified heart failure type (HCC) I50.9   4. Deep vein thrombosis (DVT) of femoral vein of both lower extremities, unspecified chronicity (HCC) I82.413   5. Protein-calorie malnutrition, severe ([place]) E43   6. Depression, unspecified depression type F32.9    Cont current meds as ordered  PT/OT as ordered  Wound care as ordered  F/u with specialists as indicated  Will follow   Osby Sweetin S. Perlie Gold  [facility] and Adult Medicine [address] [place], [place] [address] [phone] Cell (Monday-Friday 8 AM - 5 PM) [phone] After 5 PM and follow prompts

## 2016-09-04 ENCOUNTER — Encounter: Payer: Self-pay | Admitting: Internal Medicine

## 2016-09-04 ENCOUNTER — Non-Acute Institutional Stay (SKILLED_NURSING_FACILITY): Payer: PPO | Admitting: Internal Medicine

## 2016-09-04 DIAGNOSIS — L2389 Allergic contact dermatitis due to other agents: Secondary | ICD-10-CM | POA: Diagnosis not present

## 2016-09-04 DIAGNOSIS — T8189XD Other complications of procedures, not elsewhere classified, subsequent encounter: Secondary | ICD-10-CM

## 2016-09-04 DIAGNOSIS — E43 Unspecified severe protein-calorie malnutrition: Secondary | ICD-10-CM

## 2016-09-04 NOTE — Progress Notes (Signed)
Entered in error

## 2016-09-04 NOTE — Progress Notes (Signed)
DATE:  September 04, 2016  Location:   [facility] Room Number: 105 A Place of Service: SNF (31)   Extended Emergency Contact Information Primary Emergency Contact: Bellino,Bill Address: [address]          [place], [place] [address] [place] Phone: [phone] Mobile Phone: [phone] Relation: Spouse Secondary Emergency Contact: Faythe Ghee Address: [address]          [place], [place] [address] [place] of [place] Phone: [phone] Mobile Phone: [phone] Relation: Sister  Advanced Directive information Does Patient Have a Medical Advance Directive?: Yes, Type of Advance Directive: Out of facility DNR (pink MOST or yellow form), Pre-existing out of facility DNR order (yellow form or pink MOST form): Yellow form placed in chart (order not valid for inpatient use);Pink MOST form placed in chart (order not valid for inpatient use), Does patient want to make changes to medical advance directive?: No - Patient declined  Chief Complaint  Patient presents with  . Acute Visit    Rash on Buttocks    HPI:  83 yo long term female resident seen today for rash on buttocks noted by nursing staff. Pt states she was unaware of any rash on buttocks. She denies pain, f/c. She has intermittent pruritis and takes prn benadryl.   Chronic pain syndrome due to generalized arthritis and chronic bilateral low back pain - on chronic narcotic therapy with methadone 5 mg twice daily; robaxin 500 mg four times daily; neurontin 300 mg twice daily; lidoderm patch; voltaren gel 1 gm twice daily to knees; oxycodone 5 or 10 mg every 8 hours as needed;  benadryl 12.5 mg every 6 hours as needed for itching. She has received cortisone injections to both knees by Ortho.   Constipation - stable on miralax 17 gm daily and MOM 60 cc every other day    S/p exploratory laparotomy with removal of mesh and placement of Phasix ([employer]) mesh on 12/21/15 - large abdominal wound  decreasing in size gradually. She is followed by facility wound doctor   Insomnia - stable on restoril 15 mg nightly   Bilateral DVT of femoral veins - stable on xarelto 20 mg daily. No signs of bleeding   Hx dCHF - stable on demadex 40 mg daily with k+ 20 meq daily; O2 dependent   GERD - stable on prilosec 20 mg daily   Depression with anxiety - mood stable on cymbalta 60 mg daily; depakote 250 mg nightly.   Glaucoma - stable on timolol to both eyes twice daily. Followed by eye specialist  Allergic rhinitis - stable on  flonase and mucinex daily   Pulmonary arterial hypertension - PA peak pressure 41 mm Hg in 2016  Anemia of chronic disease - stable. Hgb 11.1. She is s/p 2 units PRBCs in Oct 2017.  FTT/severe malnutrition - she received TPN during Oct 2017 hospital admission. Albumin 1.9. She gets nutritional supplements per facility protocol. She takes vitamins/minerals  Past Medical History:  Diagnosis Date  . Anemia   . Arthritis   . CHF (congestive heart failure) ([place])   . Depression   . GERD (gastroesophageal reflux disease)   . Hyponatremia 01/2016  . Perforated chronic gastric ulcer ([place]) 08/11/2014  . Pneumonia   . Pulmonary arterial hypertension ([place]) 11/28/2014    Past Surgical History:  Procedure Laterality Date  . ABDOMINAL HYSTERECTOMY    . ABDOMINAL SURGERY    . BACK SURGERY  2000  . COLON SURGERY  x2-blockage  . COLONOSCOPY    . ERCP    . HERNIA REPAIR  2012   x2 with mesh  . JOINT REPLACEMENT Left    shoulder  . LAPAROTOMY N/A 08/04/2014   Procedure: EXPLORATORY LAPAROTOMY, REPAIR OF PERFORATED ULCER;  Surgeon: Excell Seltzer, MD;  [facility];  Service: General;  Laterality: N/A;  . LAPAROTOMY N/A 12/21/2015   Procedure: EXPLORATORY LAPAROTOMY/ REMOVAL OF MESH;  Surgeon: Coralie Keens, MD;  Location: [facility];  Service: General;  Laterality: N/A;  . ORIF WRIST FRACTURE  2012   left  . REVERSE SHOULDER ARTHROPLASTY Right 12/04/2013  .  REVERSE SHOULDER ARTHROPLASTY  12/04/2013   Procedure: REVERSE SHOULDER ARTHROPLASTY;  Surgeon: Nita Sells, MD;  Location: [facility] OR;  Service: Orthopedics;;  Right reverse total shoulder arthroplasty  . ROTATOR CUFF REPAIR     dr Tamera Punt  . SHOULDER ARTHROSCOPY  2012   lt and rt  . TENDON REPAIR Right 04/13/2014   Procedure: RIGHT HAND CENTRAL TENDON CENTRALIZATION OF LONG AND RING FINGERS;  Surgeon: Jolyn Nap, MD;  Location: [facility];  Service: Orthopedics;  Laterality: Right;    Patient Care Team: Gildardo Cranker, DO as PCP - General (Internal Medicine) Nyoka Cowden Phylis Bougie, NP as Nurse Practitioner ([place]) [facility] [place] ([place])  Social History   Social History  . Marital status: Married    Spouse name: N/A  . Number of children: N/A  . Years of education: N/A   Occupational History  . Not on file.   Social History Main Topics  . Smoking status: Never Smoker  . Smokeless tobacco: Never Used  . Alcohol use 4.2 oz/week    7 Glasses of wine per week     Comment: 8 oz -12 oz of wine every   . Drug use: No  . Sexual activity: Not on file   Other Topics Concern  . Not on file   Social History Narrative  . No narrative on file     reports that she has never smoked. She has never used smokeless tobacco. She reports that she drinks about 4.2 oz of alcohol per week . She reports that she does not use drugs.  Family History  Problem Relation Age of Onset  . Stroke Mother   . Breast cancer Sister   . Ulcerative colitis Daughter   . Chronic fatigue Daughter   . Chronic fatigue Daughter    Family Status  Relation Status  . Mother Deceased  . Father Deceased at age 77       accident  . Sister Alive  . Brother Alive  . Sister Alive  . Sister Alive  . Brother Alive  . Son Alive  . Daughter Alive  . Daughter Alive  . Son Alive    Immunization History  Administered Date(s) Administered  .  Influenza Split 02/03/2015    Allergies  Allergen Reactions  . Tape Other (See Comments)    SKIN IS VERY THIN; IT BRUISES AND TEARS EASILY!! -THX    Medications: Patient's Medications  New Prescriptions   No medications on file  Previous Medications   ACETAMINOPHEN (TYLENOL) 325 MG TABLET    Take 325 mg by mouth every 6 (six) hours as needed for mild pain.    AMINO ACIDS-PROTEIN HYDROLYS (FEEDING SUPPLEMENT, PRO-STAT SUGAR FREE 64,) LIQD    Take 30 mLs by mouth 3 (three) times daily with meals.   BISACODYL (DULCOLAX) 10 MG SUPPOSITORY  Place 1 suppository (10 mg total) rectally daily as needed for moderate constipation (May repeat times one).   DICLOFENAC SODIUM (VOLTAREN) 1 % GEL    Apply 1 g topically 2 (two) times daily. Apply to bilateral knees for pain   DIPHENHYDRAMINE HCL (BENADRYL PO)    Take 0.5 tablet every 6 hours as needed for itching    DIVALPROEX (DEPAKOTE SPRINKLE) 125 MG CAPSULE    Give 3 tablets by mouth at bedtime   DULOXETINE (CYMBALTA) 60 MG CAPSULE    Take 60 mg by mouth at bedtime.    FLUTICASONE (FLONASE) 50 MCG/ACT NASAL SPRAY    Place 1 spray into both nostrils at bedtime.   GABAPENTIN (NEURONTIN) 300 MG CAPSULE    Take 300 mg by mouth 2 (two) times daily.   GUAIFENESIN (MUCINEX) 600 MG 12 HR TABLET    Take 600 mg by mouth daily.   KETOTIFEN FUMARATE (ZADITOR OP)    Instill 1 drop in both eyes two times daily for Allergy   LIDOCAINE 4 % PTCH    Apply to back topically in the morning for Pain and Remove & Discard patch within 12 hours or as directed by MD   MAGNESIUM HYDROXIDE (MILK OF MAGNESIA) 400 MG/5ML SUSPENSION    Take 60 mLs by mouth every other day. FOR CONSTIPATION   METHADONE (DOLOPHINE) 5 MG TABLET    Take 5 mg by mouth at bedtime.   METHOCARBAMOL (ROBAXIN) 500 MG TABLET    Take 500 mg by mouth 4 (four) times daily.    MULTIPLE VITAMIN (MULTIVITAMIN) TABLET    Take 1 tablet by mouth daily.   OMEGA-3 FATTY ACIDS (FISH OIL) 1000 MG CPDR    Take 1,000  mg by mouth daily.    OMEPRAZOLE (PRILOSEC) 20 MG CAPSULE    Take 20 mg by mouth daily.   OXYCODONE (OXY IR/ROXICODONE) 5 MG IMMEDIATE RELEASE TABLET    Take 1-2 tablets (5-10 mg total) by mouth every 8 (eight) hours as needed. For pain   OXYGEN    Inhale into the lungs. 2-6 L/min to keep stat at 90 or above   POLYETHYLENE GLYCOL (MIRALAX / GLYCOLAX) PACKET    Take 17 g by mouth daily.    POTASSIUM CHLORIDE (KLOR-CON) 20 MEQ PACKET    Take 20 mEq by mouth daily as needed. Take 1 tablet by mouth daily as needed only when Lasix is given   RIVAROXABAN (XARELTO) 20 MG TABS TABLET    Take 20 mg by mouth at bedtime.   TEMAZEPAM (RESTORIL) 15 MG CAPSULE    Take 15 mg by mouth at bedtime.    TIMOLOL (BETIMOL) 0.5 % OPHTHALMIC SOLUTION    Place 1 drop into both eyes 2 (two) times daily.    TORSEMIDE (DEMADEX) 20 MG TABLET    Take 2 tablets by mouth once daily   UNABLE TO FIND    House Supplement - Take 4 oz by mouth 2 times daily  Modified Medications   No medications on file  Discontinued Medications   METHADONE (DOLOPHINE) 5 MG TABLET    Take one tablet by mouth twice daily for pain    Review of Systems  Musculoskeletal: Positive for arthralgias and gait problem.  Skin: Positive for rash and wound.  All other systems reviewed and are negative.   Vitals:   09/04/16 1128  BP: (!) 93/45  Pulse: 78  Resp: 16  Temp: 98.1 F (36.7 C)  SpO2: 95%  Weight: 171 lb (77.6  kg)  Height: 5\' 3"  (1.6 m)   Body mass index is 30.29 kg/m.  Physical Exam  Abdominal:  Abdominal wound has decreased in size, no d/c; min redness at circumference of wound; tissue appears healthy  Skin: Skin is warm and dry. There is erythema.        Labs reviewed: Abstract on 08/08/2016  Component Date Value Ref Range Status  . Hemoglobin 08/07/2016 11.1* 12.0 - 16.0 g/dL Final  . HCT 08/07/2016 34* 36 - 46 % Final  . Neutrophils Absolute 08/07/2016 8  /L Final  . Platelets 08/07/2016 226  150 - 399 K/L Final    . WBC 08/07/2016 11.0  10^3/mL Final  Nursing Home on 06/23/2016  Component Date Value Ref Range Status  . Glucose 05/30/2016 112  mg/dL Final  . BUN 05/30/2016 12  4 - 21 mg/dL Final  . Creatinine 05/30/2016 0.6  0.5 - 1.1 mg/dL Final  . Potassium 05/30/2016 4.3  3.4 - 5.3 mmol/L Final  . Sodium 05/30/2016 145  137 - 147 mmol/L Final    No results found.   Assessment/Plan   ICD-10-CM   1. Allergic contact dermatitis due to other agents L23.89    likely related to adult brief  2. Surgical wound, non healing, subsequent encounter T81.89XD   3. Protein-calorie malnutrition, severe ([place]) E43      Topical abx to contact dermatitis site  Recommend hypoallergenic briefs  Cont wound care to abdomen and right buttock  Cont other meds as ordered  Check hepatic function tests  Will follow  Yanel Dombrosky S. Perlie Gold  [facility] and Adult Medicine [address] [place], [place] [address] [phone] Cell (Monday-Friday 8 AM - 5 PM) [phone] After 5 PM and follow prompts

## 2016-09-05 DIAGNOSIS — E871 Hypo-osmolality and hyponatremia: Secondary | ICD-10-CM | POA: Diagnosis not present

## 2016-09-05 DIAGNOSIS — Z79899 Other long term (current) drug therapy: Secondary | ICD-10-CM | POA: Diagnosis not present

## 2016-09-05 DIAGNOSIS — E43 Unspecified severe protein-calorie malnutrition: Secondary | ICD-10-CM | POA: Diagnosis not present

## 2016-09-05 DIAGNOSIS — D649 Anemia, unspecified: Secondary | ICD-10-CM | POA: Diagnosis not present

## 2016-09-05 DIAGNOSIS — L98492 Non-pressure chronic ulcer of skin of other sites with fat layer exposed: Secondary | ICD-10-CM | POA: Diagnosis not present

## 2016-09-11 ENCOUNTER — Non-Acute Institutional Stay (SKILLED_NURSING_FACILITY): Payer: PPO

## 2016-09-11 DIAGNOSIS — Z0000 Encounter for general adult medical examination without abnormal findings: Secondary | ICD-10-CM | POA: Diagnosis not present

## 2016-09-11 NOTE — Progress Notes (Signed)
Subjective:   Monique Russo is a 83 y.o. female who presents for an Initial Medicare Annual Wellness Visit at [facility]       Objective:    Today's Vitals   09/11/16 1301  BP: (!) 98/58  Pulse: 63  Temp: 97.7 F (36.5 C)  TempSrc: Oral  SpO2: 93%  Weight: 173 lb (78.5 kg)  Height: 5\' 3"  (1.6 m)   Body mass index is 30.65 kg/m.   Current Medications (verified) Outpatient Encounter Prescriptions as of 09/11/2016  Medication Sig  . acetaminophen (TYLENOL) 325 MG tablet Take 325 mg by mouth every 6 (six) hours as needed for mild pain.   . Amino Acids-Protein Hydrolys (FEEDING SUPPLEMENT, PRO-STAT SUGAR FREE 64,) LIQD Take 30 mLs by mouth 3 (three) times daily with meals.  . bisacodyl (DULCOLAX) 10 MG suppository Place 1 suppository (10 mg total) rectally daily as needed for moderate constipation (May repeat times one).  Marland Kitchen diclofenac sodium (VOLTAREN) 1 % GEL Apply 1 g topically 2 (two) times daily. Apply to bilateral knees for pain  . DiphenhydrAMINE HCl (BENADRYL PO) Take 0.5 tablet every 6 hours as needed for itching   . divalproex (DEPAKOTE SPRINKLE) 125 MG capsule Give 3 tablets by mouth at bedtime  . DULoxetine (CYMBALTA) 60 MG capsule Take 60 mg by mouth at bedtime.   . fluticasone (FLONASE) 50 MCG/ACT nasal spray Place 1 spray into both nostrils at bedtime.  . gabapentin (NEURONTIN) 300 MG capsule Take 300 mg by mouth 2 (two) times daily.  Marland Kitchen guaiFENesin (MUCINEX) 600 MG 12 hr tablet Take 600 mg by mouth daily.  Marland Kitchen Ketotifen Fumarate (ZADITOR OP) Instill 1 drop in both eyes two times daily for Allergy  . Lidocaine 4 % PTCH Apply to back topically in the morning for Pain and Remove & Discard patch within 12 hours or as directed by MD  . magnesium hydroxide (MILK OF MAGNESIA) 400 MG/5ML suspension Take 60 mLs by mouth every other day. FOR CONSTIPATION  . methadone (DOLOPHINE) 5 MG tablet Take 5 mg by mouth at bedtime.  . methocarbamol (ROBAXIN) 500 MG tablet  Take 500 mg by mouth 4 (four) times daily.   . Multiple Vitamin (MULTIVITAMIN) tablet Take 1 tablet by mouth daily.  . Omega-3 Fatty Acids (FISH OIL) 1000 MG CPDR Take 1,000 mg by mouth daily.   Marland Kitchen omeprazole (PRILOSEC) 20 MG capsule Take 20 mg by mouth daily.  Marland Kitchen oxyCODONE (OXY IR/ROXICODONE) 5 MG immediate release tablet Take 1-2 tablets (5-10 mg total) by mouth every 8 (eight) hours as needed. For pain  . OXYGEN Inhale into the lungs. 2-6 L/min to keep stat at 90 or above  . polyethylene glycol (MIRALAX / GLYCOLAX) packet Take 17 g by mouth daily.   . potassium chloride (KLOR-CON) 20 MEQ packet Take 20 mEq by mouth daily as needed. Take 1 tablet by mouth daily as needed only when Lasix is given  . rivaroxaban (XARELTO) 20 MG TABS tablet Take 20 mg by mouth at bedtime.  . temazepam (RESTORIL) 15 MG capsule Take 15 mg by mouth at bedtime.   . timolol (BETIMOL) 0.5 % ophthalmic solution Place 1 drop into both eyes 2 (two) times daily.   Marland Kitchen torsemide (DEMADEX) 20 MG tablet Take 2 tablets by mouth once daily  . UNABLE TO FIND House Supplement - Take 4 oz by mouth 2 times daily   No facility-administered encounter medications on file as of 09/11/2016.     Allergies (  verified) Tape   History: Past Medical History:  Diagnosis Date  . Anemia   . Arthritis   . CHF (congestive heart failure) ([place])   . Depression   . GERD (gastroesophageal reflux disease)   . Hyponatremia 01/2016  . Perforated chronic gastric ulcer ([place]) 08/11/2014  . Pneumonia   . Pulmonary arterial hypertension ([place]) 11/28/2014   Past Surgical History:  Procedure Laterality Date  . ABDOMINAL HYSTERECTOMY    . ABDOMINAL SURGERY    . BACK SURGERY  2000  . COLON SURGERY     x2-blockage  . COLONOSCOPY    . ERCP    . HERNIA REPAIR  2012   x2 with mesh  . JOINT REPLACEMENT Left    shoulder  . LAPAROTOMY N/A 08/04/2014   Procedure: EXPLORATORY LAPAROTOMY, REPAIR OF PERFORATED ULCER;  Surgeon: Excell Seltzer, MD;   Location: [facility];  Service: General;  Laterality: N/A;  . LAPAROTOMY N/A 12/21/2015   Procedure: EXPLORATORY LAPAROTOMY/ REMOVAL OF MESH;  Surgeon: Coralie Keens, MD;  Location: [facility];  Service: General;  Laterality: N/A;  . ORIF WRIST FRACTURE  2012   left  . REVERSE SHOULDER ARTHROPLASTY Right 12/04/2013  . REVERSE SHOULDER ARTHROPLASTY  12/04/2013   Procedure: REVERSE SHOULDER ARTHROPLASTY;  Surgeon: Nita Sells, MD;  Location: [facility];  Service: Orthopedics;;  Right reverse total shoulder arthroplasty  . ROTATOR CUFF REPAIR     dr Tamera Punt  . SHOULDER ARTHROSCOPY  2012   lt and rt  . TENDON REPAIR Right 04/13/2014   Procedure: RIGHT HAND CENTRAL TENDON CENTRALIZATION OF LONG AND RING FINGERS;  Surgeon: Jolyn Nap, MD;  Location: [facility];  Service: Orthopedics;  Laterality: Right;   Family History  Problem Relation Age of Onset  . Stroke Mother   . Breast cancer Sister   . Ulcerative colitis Daughter   . Chronic fatigue Daughter   . Chronic fatigue Daughter    Social History   Occupational History  . Not on file.   Social History Main Topics  . Smoking status: Never Smoker  . Smokeless tobacco: Never Used  . Alcohol use 4.2 oz/week    7 Glasses of wine per week     Comment: 8 oz -12 oz of wine every   . Drug use: No  . Sexual activity: Not on file    Tobacco Counseling Counseling given: Not Answered   Activities of Daily Living In your present state of health, do you have any difficulty performing the following activities: 09/11/2016 01/21/2016  Hearing? N N  Vision? N N  Difficulty concentrating or making decisions? Y N  Walking or climbing stairs? Y Y  Dressing or bathing? Y Y  Doing errands, shopping? Tempie Donning  Preparing Food and eating ? Y -  Using the Toilet? Y -  In the past six months, have you accidently leaked urine? Y -  Do you have problems with loss of bowel control? Y -  Managing your Medications? Y -  Managing your  Finances? Y -  Housekeeping or managing your Housekeeping? Y -  Some recent data might be hidden    Immunizations and Health Maintenance Immunization History  Administered Date(s) Administered  . Influenza [place] 02/03/2015   There are no preventive care reminders to display for this patient.  Patient Care Team: Gildardo Cranker, DO as PCP - General (Internal Medicine) Nyoka Cowden Phylis Bougie, NP as Nurse Practitioner ([place], [place] ([place])  Indicate any recent  Medical Services you may have received from other than [facility] providers in the past year (date may be approximate).     Assessment:   This is a routine wellness examination for Monique Russo.   Hearing/Vision screen No exam data present  Dietary issues and exercise activities discussed: Current Exercise Habits: The patient does not participate in regular exercise at present, Exercise limited by: orthopedic condition(s)  Goals    . Maintain Lifestyle          Pt will maintain lifestyle.       Depression Screen PHQ 2/9 Scores 09/11/2016  PHQ - 2 Score 3  PHQ- 9 Score 3    Fall Risk Fall Risk  09/11/2016  Falls in the past year? No    Cognitive Function:     6CIT Screen 09/11/2016  What Year? 0 points  What month? 0 points  What time? 0 points  Count back from 20 0 points  Months in reverse 0 points  Repeat phrase 0 points  Total Score 0    Screening Tests Health Maintenance  Topic Date Due  . PNA vac Low Risk Adult (1 of 2 - PCV13) 06/01/2017 (Originally 08/29/1997)  . INFLUENZA VACCINE  06/02/2017 (Originally 10/18/2016)  . DEXA SCAN  06/02/2017 (Originally 08/29/1997)  . TETANUS/TDAP  06/02/2017 (Originally 08/30/1949)      Plan:    I have personally reviewed and addressed the Medicare Annual Wellness questionnaire and have noted the following in the patient's chart:  A. Medical and social history B. Use of alcohol, tobacco or illicit drugs  C. Current medications  and supplements D. Functional ability and status E.  Nutritional status F.  Physical activity G. Advance directives H. List of other physicians I.  Hospitalizations, surgeries, and ER visits in previous 12 months J.  Jane Lew to include hearing, vision, cognitive, depression L. Referrals and appointments - none  In addition, I have reviewed and discussed with patient certain preventive protocols, quality metrics, and best practice recommendations. A written personalized care plan for preventive services as well as general preventive health recommendations were provided to patient.  See attached scanned questionnaire for additional information.   Signed,   Rich Reining, RN Nurse Health Advisor   Quick Notes   Health Maintenance: TDAP,DEXA, PNA 13 due     Abnormal Screen: PHQ-9-3     Patient Concerns: None     Nurse Concerns: None

## 2016-09-11 NOTE — Patient Instructions (Signed)
Monique Russo , Thank you for taking time to come for your Medicare Wellness Visit. I appreciate your ongoing commitment to your health goals. Please review the following plan we discussed and let me know if I can assist you in the future.   Screening recommendations/referrals: Colonoscopy up to date, long term pt Mammogram up to date, long term pt Bone Density due Recommended yearly ophthalmology/optometry visit for glaucoma screening and checkup Recommended yearly dental visit for hygiene and checkup  Vaccinations: Influenza vaccine due when available Pneumococcal vaccine 13 due Tdap vaccine due Shingles vaccine not in records    Advanced directives: Need a copy for chart  Conditions/risks identified: None  Next appointment: Dr. [name] makes rounds   Preventive Care 65 Years and Older, Female Preventive care refers to lifestyle choices and visits with your health care provider that can promote health and wellness. What does preventive care include?  A yearly physical exam. This is also called an annual well check.  Dental exams once or twice a year.  Routine eye exams. Ask your health care provider how often you should have your eyes checked.  Personal lifestyle choices, including:  Daily care of your teeth and gums.  Regular physical activity.  Eating a healthy diet.  Avoiding tobacco and drug use.  Limiting alcohol use.  Practicing safe sex.  Taking low-dose aspirin every day.  Taking vitamin and mineral supplements as recommended by your health care provider. What happens during an annual well check? The services and screenings done by your health care provider during your annual well check will depend on your age, overall health, lifestyle risk factors, and family history of disease. Counseling  Your health care provider may ask you questions about your:  Alcohol use.  Tobacco use.  Drug use.  Emotional well-being.  Home and relationship  well-being.  Sexual activity.  Eating habits.  History of falls.  Memory and ability to understand (cognition).  Work and work Statistician.  Reproductive health. Screening  You may have the following tests or measurements:  Height, weight, and BMI.  Blood pressure.  Lipid and cholesterol levels. These may be checked every 5 years, or more frequently if you are over 73 years old.  Skin check.  Lung cancer screening. You may have this screening every year starting at age 53 if you have a 30-pack-year history of smoking and currently smoke or have quit within the past 15 years.  Fecal occult blood test (FOBT) of the stool. You may have this test every year starting at age 72.  Flexible sigmoidoscopy or colonoscopy. You may have a sigmoidoscopy every 5 years or a colonoscopy every 10 years starting at age 69.  Hepatitis C blood test.  Hepatitis B blood test.  Sexually transmitted disease (STD) testing.  Diabetes screening. This is done by checking your blood sugar (glucose) after you have not eaten for a while (fasting). You may have this done every 1-3 years.  Bone density scan. This is done to screen for osteoporosis. You may have this done starting at age 21.  Mammogram. This may be done every 1-2 years. Talk to your health care provider about how often you should have regular mammograms. Talk with your health care provider about your test results, treatment options, and if necessary, the need for more tests. Vaccines  Your health care provider may recommend certain vaccines, such as:  Influenza vaccine. This is recommended every year.  Tetanus, diphtheria, and acellular pertussis (Tdap, Td) vaccine. You may need a  Td booster every 10 years.  Zoster vaccine. You may need this after age 86.  Pneumococcal 13-valent conjugate (PCV13) vaccine. One dose is recommended after age 70.  Pneumococcal polysaccharide (PPSV23) vaccine. One dose is recommended after age  69. Talk to your health care provider about which screenings and vaccines you need and how often you need them. This information is not intended to replace advice given to you by your health care provider. Make sure you discuss any questions you have with your health care provider. Document Released: 04/02/2015 Document Revised: 11/24/2015 Document Reviewed: 01/05/2015 Elsevier Interactive Patient Education  2017 [place] Prevention in the Home Falls can cause injuries. They can happen to people of all ages. There are many things you can do to make your home safe and to help prevent falls. What can I do on the outside of my home?  Regularly fix the edges of walkways and driveways and fix any cracks.  Remove anything that might make you trip as you walk through a door, such as a raised step or threshold.  Trim any bushes or trees on the path to your home.  Use bright outdoor lighting.  Clear any walking paths of anything that might make someone trip, such as rocks or tools.  Regularly check to see if handrails are loose or broken. Make sure that both sides of any steps have handrails.  Any raised decks and porches should have guardrails on the edges.  Have any leaves, snow, or ice cleared regularly.  Use sand or salt on walking paths during winter.  Clean up any spills in your garage right away. This includes oil or grease spills. What can I do in the bathroom?  Use night lights.  Install grab bars by the toilet and in the tub and shower. Do not use towel bars as grab bars.  Use non-skid mats or decals in the tub or shower.  If you need to sit down in the shower, use a plastic, non-slip stool.  Keep the floor dry. Clean up any water that spills on the floor as soon as it happens.  Remove soap buildup in the tub or shower regularly.  Attach bath mats securely with double-sided non-slip rug tape.  Do not have throw rugs and other things on the floor that can make  you trip. What can I do in the bedroom?  Use night lights.  Make sure that you have a light by your bed that is easy to reach.  Do not use any sheets or blankets that are too big for your bed. They should not hang down onto the floor.  Have a firm chair that has side arms. You can use this for support while you get dressed.  Do not have throw rugs and other things on the floor that can make you trip. What can I do in the kitchen?  Clean up any spills right away.  Avoid walking on wet floors.  Keep items that you use a lot in easy-to-reach places.  If you need to reach something above you, use a strong step stool that has a grab bar.  Keep electrical cords out of the way.  Do not use floor polish or wax that makes floors slippery. If you must use wax, use non-skid floor wax.  Do not have throw rugs and other things on the floor that can make you trip. What can I do with my stairs?  Do not leave any items on the stairs.  Make sure that there are handrails on both sides of the stairs and use them. Fix handrails that are broken or loose. Make sure that handrails are as long as the stairways.  Check any carpeting to make sure that it is firmly attached to the stairs. Fix any carpet that is loose or worn.  Avoid having throw rugs at the top or bottom of the stairs. If you do have throw rugs, attach them to the floor with carpet tape.  Make sure that you have a light switch at the top of the stairs and the bottom of the stairs. If you do not have them, ask someone to add them for you. What else can I do to help prevent falls?  Wear shoes that:  Do not have high heels.  Have rubber bottoms.  Are comfortable and fit you well.  Are closed at the toe. Do not wear sandals.  If you use a stepladder:  Make sure that it is fully opened. Do not climb a closed stepladder.  Make sure that both sides of the stepladder are locked into place.  Ask someone to hold it for you, if  possible.  Clearly mark and make sure that you can see:  Any grab bars or handrails.  First and last steps.  Where the edge of each step is.  Use tools that help you move around (mobility aids) if they are needed. These include:  Canes.  Walkers.  Scooters.  Crutches.  Turn on the lights when you go into a dark area. Replace any light bulbs as soon as they burn out.  Set up your furniture so you have a clear path. Avoid moving your furniture around.  If any of your floors are uneven, fix them.  If there are any pets around you, be aware of where they are.  Review your medicines with your doctor. Some medicines can make you feel dizzy. This can increase your chance of falling. Ask your doctor what other things that you can do to help prevent falls. This information is not intended to replace advice given to you by your health care provider. Make sure you discuss any questions you have with your health care provider. Document Released: 12/31/2008 Document Revised: 08/12/2015 Document Reviewed: 04/10/2014 Elsevier Interactive Patient Education  2017 [employer].

## 2016-09-12 DIAGNOSIS — L98499 Non-pressure chronic ulcer of skin of other sites with unspecified severity: Secondary | ICD-10-CM | POA: Diagnosis not present

## 2016-09-19 DIAGNOSIS — L98492 Non-pressure chronic ulcer of skin of other sites with fat layer exposed: Secondary | ICD-10-CM | POA: Diagnosis not present

## 2016-09-19 DIAGNOSIS — S31102A Unspecified open wound of abdominal wall, epigastric region without penetration into peritoneal cavity, initial encounter: Secondary | ICD-10-CM | POA: Diagnosis not present

## 2016-09-19 DIAGNOSIS — T8579XS Infection and inflammatory reaction due to other internal prosthetic devices, implants and grafts, sequela: Secondary | ICD-10-CM | POA: Diagnosis not present

## 2016-09-19 DIAGNOSIS — R1084 Generalized abdominal pain: Secondary | ICD-10-CM | POA: Diagnosis not present

## 2016-09-21 ENCOUNTER — Encounter: Payer: Self-pay | Admitting: Adult Health

## 2016-09-21 ENCOUNTER — Non-Acute Institutional Stay (SKILLED_NURSING_FACILITY): Payer: PPO | Admitting: Adult Health

## 2016-09-21 DIAGNOSIS — F32A Depression, unspecified: Secondary | ICD-10-CM

## 2016-09-21 DIAGNOSIS — G8929 Other chronic pain: Secondary | ICD-10-CM

## 2016-09-21 DIAGNOSIS — M545 Low back pain, unspecified: Secondary | ICD-10-CM

## 2016-09-21 DIAGNOSIS — F329 Major depressive disorder, single episode, unspecified: Secondary | ICD-10-CM

## 2016-09-21 DIAGNOSIS — G894 Chronic pain syndrome: Secondary | ICD-10-CM

## 2016-09-21 DIAGNOSIS — G4700 Insomnia, unspecified: Secondary | ICD-10-CM

## 2016-09-21 NOTE — Progress Notes (Signed)
Location:   [place] Room Number: 105 A Place of Service:  SNF (31)   CODE STATUS: DNR  Allergies  Allergen Reactions  . Tape Other (See Comments)    SKIN IS VERY THIN; IT BRUISES AND TEARS EASILY!! -THX    Chief Complaint  Patient presents with  . Acute Visit    Behaviors    HPI:  Staff reports that she is yelling out all night and is keeping other residents up at night. She denies yelling out; but does tell me that she is not sleeping at night.  She does tell me that her pain is presently being managed with her current regimen. She would like something else to help her sleep at night; this has been a long term problem for her.    Past Medical History:  Diagnosis Date  . Anemia   . Arthritis   . CHF (congestive heart failure) ([place])   . Depression   . GERD (gastroesophageal reflux disease)   . Hyponatremia 01/2016  . Perforated chronic gastric ulcer ([place]) 08/11/2014  . Pneumonia   . Pulmonary arterial hypertension ([place]) 11/28/2014    Past Surgical History:  Procedure Laterality Date  . ABDOMINAL HYSTERECTOMY    . ABDOMINAL SURGERY    . BACK SURGERY  2000  . COLON SURGERY     x2-blockage  . COLONOSCOPY    . ERCP    . HERNIA REPAIR  2012   x2 with mesh  . JOINT REPLACEMENT Left    shoulder  . LAPAROTOMY N/A 08/04/2014   Procedure: EXPLORATORY LAPAROTOMY, REPAIR OF PERFORATED ULCER;  Surgeon: Excell Seltzer, MD;  Location: [facility];  Service: General;  Laterality: N/A;  . LAPAROTOMY N/A 12/21/2015   Procedure: EXPLORATORY LAPAROTOMY/ REMOVAL OF MESH;  Surgeon: Coralie Keens, MD;  Location: [place];  Service: General;  Laterality: N/A;  . ORIF WRIST FRACTURE  2012   left  . REVERSE SHOULDER ARTHROPLASTY Right 12/04/2013  . REVERSE SHOULDER ARTHROPLASTY  12/04/2013   Procedure: REVERSE SHOULDER ARTHROPLASTY;  Surgeon: Nita Sells, MD;  Location: [facility];  Service: Orthopedics;;  Right reverse total shoulder arthroplasty  . ROTATOR CUFF  REPAIR     dr Tamera Punt  . SHOULDER ARTHROSCOPY  2012   lt and rt  . TENDON REPAIR Right 04/13/2014   Procedure: RIGHT HAND CENTRAL TENDON CENTRALIZATION OF LONG AND RING FINGERS;  Surgeon: Jolyn Nap, MD;  Location: [place];  Service: Orthopedics;  Laterality: Right;    Social History   Social History  . Marital status: Married    Spouse name: N/A  . Number of children: N/A  . Years of education: N/A   Occupational History  . Not on file.   Social History Main Topics  . Smoking status: Never Smoker  . Smokeless tobacco: Never Used  . Alcohol use 4.2 oz/week    7 Glasses of wine per week     Comment: 8 oz -12 oz of wine every   . Drug use: No  . Sexual activity: Not on file   Other Topics Concern  . Not on file   Social History Narrative  . No narrative on file   Family History  Problem Relation Age of Onset  . Stroke Mother   . Breast cancer Sister   . Ulcerative colitis Daughter   . Chronic fatigue Daughter   . Chronic fatigue Daughter       VITAL SIGNS BP (!) 94/54   Pulse 66  Temp 98.3 F (36.8 C)   Resp 18   Ht '5\' 3"'  (1.6 m)   Wt 163 lb 6.4 oz (74.1 kg)   BMI 28.95 kg/m   Patient's Medications  New Prescriptions   No medications on file  Previous Medications   ACETAMINOPHEN (TYLENOL) 325 MG TABLET    Take 325 mg by mouth every 6 (six) hours as needed for mild pain.    AMINO ACIDS-PROTEIN HYDROLYS (FEEDING SUPPLEMENT, PRO-STAT SUGAR FREE 64,) LIQD    Take 30 mLs by mouth 3 (three) times daily with meals.   BISACODYL (DULCOLAX) 10 MG SUPPOSITORY    Place 1 suppository (10 mg total) rectally daily as needed for moderate constipation (May repeat times one).   DICLOFENAC SODIUM (VOLTAREN) 1 % GEL    Apply 1 g topically 2 (two) times daily. Apply to bilateral knees for pain   DIPHENHYDRAMINE HCL (BENADRYL PO)    Take 0.5 tablet every 6 hours as needed for itching    DIVALPROEX (DEPAKOTE SPRINKLE) 125 MG CAPSULE    Give 375 mg by  mouth at bedtime   DULOXETINE (CYMBALTA) 60 MG CAPSULE    Take 60 mg by mouth at bedtime.    FLUOROMETHOLONE (FML) 0.1 % OPHTHALMIC SUSPENSION    Place 1 drop into both eyes 3 (three) times daily.   FLUTICASONE (FLONASE) 50 MCG/ACT NASAL SPRAY    Place 1 spray into both nostrils at bedtime.   GABAPENTIN (NEURONTIN) 300 MG CAPSULE    Take 300 mg by mouth 2 (two) times daily.   GUAIFENESIN (MUCINEX) 600 MG 12 HR TABLET    Take 600 mg by mouth daily.   KETOTIFEN FUMARATE (ZADITOR OP)    Instill 1 drop in both eyes two times daily for Allergy   LACTOBACILLUS PO    Take 1 capsule by mouth 2 (two) times daily.   LIDOCAINE 4 % PTCH    Apply to back topically in the morning for Pain and Remove & Discard patch within 12 hours or as directed by MD   MAGNESIUM HYDROXIDE (MILK OF MAGNESIA) 400 MG/5ML SUSPENSION    Take 60 mLs by mouth every other day. AS NEEDED FOR CONSTIPATION   METHADONE (DOLOPHINE) 5 MG TABLET    Take 5 mg by mouth at bedtime.   METHOCARBAMOL (ROBAXIN) 500 MG TABLET    Take 500 mg by mouth every 6 (six) hours as needed.    MULTIPLE VITAMIN (MULTIVITAMIN) TABLET    Take 1 tablet by mouth daily.   OMEGA-3 FATTY ACIDS (FISH OIL) 1000 MG CPDR    Take 1,000 mg by mouth daily.    OMEPRAZOLE (PRILOSEC) 20 MG CAPSULE    Take 20 mg by mouth daily.   OXYCODONE (OXY IR/ROXICODONE) 5 MG IMMEDIATE RELEASE TABLET    Take 1-2 tablets (5-10 mg total) by mouth every 8 (eight) hours as needed. For pain   OXYGEN    Inhale into the lungs. 2-6 L/min to keep stat at 90 or above   POLYETHYLENE GLYCOL (MIRALAX / GLYCOLAX) PACKET    Take 17 g by mouth daily.    POTASSIUM CHLORIDE (KLOR-CON) 20 MEQ PACKET    Take 20 mEq by mouth daily as needed. Take 1 tablet by mouth daily as needed only when Lasix is given   PROPYLENE GLYCOL OP    Instill 2 drops in both eyes three times a day for dry eyes   RIVAROXABAN (XARELTO) 20 MG TABS TABLET    Take 20 mg by mouth  at bedtime.   TEMAZEPAM (RESTORIL) 15 MG CAPSULE    Take  15 mg by mouth at bedtime.    TIMOLOL (BETIMOL) 0.5 % OPHTHALMIC SOLUTION    Place 1 drop into both eyes 2 (two) times daily.    TORSEMIDE (DEMADEX) 20 MG TABLET    Take 2 tablets by mouth once daily   UNABLE TO FIND    House Supplement - Take 4 oz by mouth 2 times daily  Modified Medications   No medications on file  Discontinued Medications   No medications on file     SIGNIFICANT DIAGNOSTIC EXAMS  01-20-16; chest x-ray: 1. Cardiomegaly and mild interstitial edema. 2. Improved aeration.  01-21-16: ct of abdomen and pelvis: 1. Apparent wall thickening at the rectum could reflect mild proctitis. 2. Small bilateral pleural effusions noted. Bibasilar airspace opacities may reflect atelectasis or possibly infection. 3. Large right-sided hiatal hernia, containing most of the stomach. 4. Mild soft tissue inflammation suggested about the bladder. Would correlate for any evidence of cystitis. 5. Nonspecific soft tissue inflammation suggested about the transverse colon. 6. Mild inflammation about the gallbladder is nonspecific, given the underlying diffuse soft tissue inflammation about the abdomen. 7. Very large anterior abdominal wall defect again noted, with packing material and underlying soft tissue thickening. 8. Scattered diverticulosis along the distal sigmoid colon, without evidence of diverticulitis. 9. Scattered aortic atherosclerosis noted. 10. Right convex thoracolumbar scoliosis noted.   01-21-16 ct of neck: 1. Findings consistent with right parotiditis. No abscess identified. No significant lymphadenopathy. 2. Partially visualized small right pleural effusion and dependent right upper lobe opacity which may represent associated pneumonia or atelectasis.  07-03-16: right hip x-ray:No evidence of fracture or dislocation.   07-03-16: lumbar spine x-ray: No evidence of fracture or subluxation along the lumbar spine. Right convex thoracolumbar scoliosis again noted, with  underlying degenerative change.   LABS REVIEWED:   12-27-15: pre-albumin 6.9 01-20-16; wbc 13.8; hgb 10.2; hct 32.6; mcv 95.0; plt 447; gluocose 109; bun 14; creat 0.62; k+ 4.7; na++ 131; alk phos 130; albumin 1.9 01-23-16: wbc 15.1; hgb 8.7; hct 27.2; mcv 93.6; plt 381; glucose 92; bun 8; creat 0.62; k+ 3.8; na++ 134 02-15-16: wbc 9.5; hgb 9.9; hct 31; plt 379; glucose 110; bun 9; creat 0.6; k+ 4.3; na++ 135      Physical Exam  Constitutional: No distress.  Frail   Eyes: Conjunctivae are normal.  Neck: Neck supple. No JVD present. No thyromegaly present.  Cardiovascular: Normal rate, regular rhythm and intact distal pulses.   Murmur heard. Respiratory: Effort normal and breath sounds normal. No respiratory distress. She has no wheezes.  GI: Soft. Bowel sounds are normal. She exhibits no distension. There is no tenderness.  Abdominal wound: healthy wound bed without signs of infection Musculoskeletal: She exhibits lower extremity edema   Able to move all extremities   Lymphadenopathy:    She has no cervical adenopathy.  Neurological: She is alert.  Skin: Skin is warm and dry. She is not diaphoretic.  Psychiatric: She has a normal mood and affect.    ASSESSMENT/ PLAN:   1. Chronic pain syndrome: has chronic bilateral low back pain: is on chronic narcotic therapy: will continue methadone 5 mg nighty  robaxin 500 mg every 6 hours as needed  neurontin 300 mg twice daily  lidoderm patch; and voltaren gel 1 gm twice daily to knees oxycodone 5 or 10 mg every 8 hours as needed    2. Insomnia: will continue restoril 15 mg  nightly   3. Depression with anxiety: will continue cymbalta 60 mg daily  Will increase her depakote to 500 mg nightly to help stabilize her mood and allow her to sleep quietly during the night.     MD is aware of resident's narcotic use and is in agreement with current plan of care. We will attempt to wean resident as apropriate     Ok Edwards NP [facility]  Contact [phone] Monday through Friday 8am- 5pm  After hours call [phone]

## 2016-09-22 ENCOUNTER — Encounter: Payer: Self-pay | Admitting: Adult Health

## 2016-09-22 ENCOUNTER — Non-Acute Institutional Stay (SKILLED_NURSING_FACILITY): Payer: PPO | Admitting: Adult Health

## 2016-09-22 DIAGNOSIS — L03213 Periorbital cellulitis: Secondary | ICD-10-CM

## 2016-09-22 NOTE — Progress Notes (Signed)
Location:   [place] Room Number: 105 A Place of Service:  SNF (31)   CODE STATUS: DNR  Allergies  Allergen Reactions  . Tape Other (See Comments)    SKIN IS VERY THIN; IT BRUISES AND TEARS EASILY!! -THX    Chief Complaint  Patient presents with  . Acute Visit    Eye Redness    HPI:  She is complaining of bilateral eye redness and swelling with eye redness and pain. She tells me that she has had this for several months. I have not seen this prior to today. There are no reports of fever present. Staff reports that she has not been complaining  of her eyes until recently. She tells me that she cannot go to the eye doctor as she has to go by stretcher.   Past Medical History:  Diagnosis Date  . Anemia   . Arthritis   . CHF (congestive heart failure) ([place])   . Depression   . GERD (gastroesophageal reflux disease)   . Hyponatremia 01/2016  . Perforated chronic gastric ulcer ([place]) 08/11/2014  . Pneumonia   . Pulmonary arterial hypertension ([place]) 11/28/2014    Past Surgical History:  Procedure Laterality Date  . ABDOMINAL HYSTERECTOMY    . ABDOMINAL SURGERY    . BACK SURGERY  2000  . COLON SURGERY     x2-blockage  . COLONOSCOPY    . ERCP    . HERNIA REPAIR  2012   x2 with mesh  . JOINT REPLACEMENT Left    shoulder  . LAPAROTOMY N/A 08/04/2014   Procedure: EXPLORATORY LAPAROTOMY, REPAIR OF PERFORATED ULCER;  Surgeon: Excell Seltzer, MD;  Location: [facility];  Service: General;  Laterality: N/A;  . LAPAROTOMY N/A 12/21/2015   Procedure: EXPLORATORY LAPAROTOMY/ REMOVAL OF MESH;  Surgeon: Coralie Keens, MD;  Location: [place];  Service: General;  Laterality: N/A;  . ORIF WRIST FRACTURE  2012   left  . REVERSE SHOULDER ARTHROPLASTY Right 12/04/2013  . REVERSE SHOULDER ARTHROPLASTY  12/04/2013   Procedure: REVERSE SHOULDER ARTHROPLASTY;  Surgeon: Nita Sells, MD;  Location: [facility] OR;  Service: Orthopedics;;  Right reverse total shoulder arthroplasty    . ROTATOR CUFF REPAIR     dr Tamera Punt  . SHOULDER ARTHROSCOPY  2012   lt and rt  . TENDON REPAIR Right 04/13/2014   Procedure: RIGHT HAND CENTRAL TENDON CENTRALIZATION OF LONG AND RING FINGERS;  Surgeon: Jolyn Nap, MD;  Location: [place];  Service: Orthopedics;  Laterality: Right;    Social History   Social History  . Marital status: Married    Spouse name: N/A  . Number of children: N/A  . Years of education: N/A   Occupational History  . Not on file.   Social History Main Topics  . Smoking status: Never Smoker  . Smokeless tobacco: Never Used  . Alcohol use 4.2 oz/week    7 Glasses of wine per week     Comment: 8 oz -12 oz of wine every   . Drug use: No  . Sexual activity: Not on file   Other Topics Concern  . Not on file   Social History Narrative  . No narrative on file   Family History  Problem Relation Age of Onset  . Stroke Mother   . Breast cancer Sister   . Ulcerative colitis Daughter   . Chronic fatigue Daughter   . Chronic fatigue Daughter       VITAL SIGNS BP Marland Kitchen)  94/54   Pulse 66   Temp 98.3 F (36.8 C)   Resp 18   Ht '5\' 3"'$  (1.6 m)   Wt 163 lb 6.4 oz (74.1 kg)   SpO2 96%   BMI 28.95 kg/m   Patient's Medications  New Prescriptions   No medications on file  Previous Medications   ACETAMINOPHEN (TYLENOL) 325 MG TABLET    Take 325 mg by mouth every 6 (six) hours as needed for mild pain.    AMINO ACIDS-PROTEIN HYDROLYS (FEEDING SUPPLEMENT, PRO-STAT SUGAR FREE 64,) LIQD    Take 30 mLs by mouth 3 (three) times daily with meals.   BISACODYL (DULCOLAX) 10 MG SUPPOSITORY    Place 1 suppository (10 mg total) rectally daily as needed for moderate constipation (May repeat times one).   DICLOFENAC SODIUM (VOLTAREN) 1 % GEL    Apply 1 g topically 2 (two) times daily. Apply to bilateral knees for pain   DIPHENHYDRAMINE HCL (BENADRYL PO)    Take 0.5 tablet every 6 hours as needed for itching    DIVALPROEX (DEPAKOTE SPRINKLE) 125  MG CAPSULE    Give 4 Capsules (500 mg) by mouth at bedtime   DULOXETINE (CYMBALTA) 60 MG CAPSULE    Take 60 mg by mouth at bedtime.    FLUTICASONE (FLONASE) 50 MCG/ACT NASAL SPRAY    Place 1 spray into both nostrils at bedtime.   GABAPENTIN (NEURONTIN) 300 MG CAPSULE    Take 300 mg by mouth 2 (two) times daily.   GUAIFENESIN (MUCINEX) 600 MG 12 HR TABLET    Take 600 mg by mouth daily.   KETOTIFEN FUMARATE (ZADITOR OP)    Instill 1 drop in both eyes two times daily for Allergy   LIDOCAINE 4 % PTCH    Apply to back topically in the morning for Pain and Remove & Discard patch within 12 hours or as directed by MD   MAGNESIUM HYDROXIDE (MILK OF MAGNESIA) 400 MG/5ML SUSPENSION    Take 60 mLs by mouth every other day. AS NEEDED FOR CONSTIPATION   METHADONE (DOLOPHINE) 5 MG TABLET    Take 5 mg by mouth at bedtime.   METHOCARBAMOL (ROBAXIN) 500 MG TABLET    Take 500 mg by mouth every 6 (six) hours as needed.    MULTIPLE VITAMIN (MULTIVITAMIN) TABLET    Take 1 tablet by mouth daily.   OMEGA-3 FATTY ACIDS (FISH OIL) 1000 MG CPDR    Take 1,000 mg by mouth daily.    OMEPRAZOLE (PRILOSEC) 20 MG CAPSULE    Take 20 mg by mouth daily.   OXYCODONE (OXY IR/ROXICODONE) 5 MG IMMEDIATE RELEASE TABLET    Take 1-2 tablets (5-10 mg total) by mouth every 8 (eight) hours as needed. For pain   OXYGEN    Inhale into the lungs. 2-6 L/min to keep stat at 90 or above   POLYETHYLENE GLYCOL (MIRALAX / GLYCOLAX) PACKET    Take 17 g by mouth daily.    POTASSIUM CHLORIDE (KLOR-CON) 20 MEQ PACKET    Take 20 mEq by mouth daily as needed. Take 1 tablet by mouth daily as needed only when Lasix is given   PROPYLENE GLYCOL OP    Instill 2 drops in both eyes three times a day for dry eyes   RIVAROXABAN (XARELTO) 20 MG TABS TABLET    Take 20 mg by mouth at bedtime.   TEMAZEPAM (RESTORIL) 15 MG CAPSULE    Take 15 mg by mouth at bedtime.    TIMOLOL (BETIMOL)  0.5 % OPHTHALMIC SOLUTION    Place 1 drop into both eyes 2 (two) times daily.     TORSEMIDE (DEMADEX) 20 MG TABLET    Take 2 tablets by mouth once daily   UNABLE TO FIND    House Supplement - Take 4 oz by mouth 2 times daily  Modified Medications   No medications on file  Discontinued Medications   No medications on file     SIGNIFICANT DIAGNOSTIC EXAMS   01-20-16; chest x-ray: 1. Cardiomegaly and mild interstitial edema. 2. Improved aeration.  01-21-16: ct of abdomen and pelvis: 1. Apparent wall thickening at the rectum could reflect mild proctitis. 2. Small bilateral pleural effusions noted. Bibasilar airspace opacities may reflect atelectasis or possibly infection. 3. Large right-sided hiatal hernia, containing most of the stomach. 4. Mild soft tissue inflammation suggested about the bladder. Would correlate for any evidence of cystitis. 5. Nonspecific soft tissue inflammation suggested about the transverse colon. 6. Mild inflammation about the gallbladder is nonspecific, given the underlying diffuse soft tissue inflammation about the abdomen. 7. Very large anterior abdominal wall defect again noted, with packing material and underlying soft tissue thickening. 8. Scattered diverticulosis along the distal sigmoid colon, without evidence of diverticulitis. 9. Scattered aortic atherosclerosis noted. 10. Right convex thoracolumbar scoliosis noted.   01-21-16 ct of neck: 1. Findings consistent with right parotiditis. No abscess identified. No significant lymphadenopathy. 2. Partially visualized small right pleural effusion and dependent right upper lobe opacity which may represent associated pneumonia or atelectasis.  07-03-16: right hip x-ray:No evidence of fracture or dislocation.   07-03-16: lumbar spine x-ray: No evidence of fracture or subluxation along the lumbar spine. Right convex thoracolumbar scoliosis again noted, with underlying degenerative change.   LABS REVIEWED:   12-27-15: pre-albumin 6.9 01-20-16; wbc 13.8; hgb 10.2; hct 32.6; mcv 95.0; plt 447;  gluocose 109; bun 14; creat 0.62; k+ 4.7; na++ 131; alk phos 130; albumin 1.9 01-23-16: wbc 15.1; hgb 8.7; hct 27.2; mcv 93.6; plt 381; glucose 92; bun 8; creat 0.62; k+ 3.8; na++ 134 02-15-16: wbc 9.5; hgb 9.9; hct 31; plt 379; glucose 110; bun 9; creat 0.6; k+ 4.3; na++ 135      Review of Systems  Unable to perform ROS: Other (poor memory )      Physical Exam  Constitutional: No distress.  Frail   Eyes: sclera are red and inflamed; has bilateral periorbital redness and inflammation  Neck: Neck supple. No JVD present. No thyromegaly present.  Cardiovascular: Normal rate, regular rhythm and intact distal pulses.   Murmur heard. Respiratory: Effort normal and breath sounds normal. No respiratory distress. She has no wheezes.  GI: Soft. Bowel sounds are normal. She exhibits no distension. There is no tenderness.  Abdominal wound: healthy wound bed without signs of infection Musculoskeletal: She exhibits lower extremity trace edema   Able to move upper extremities   Lymphadenopathy:    She has no cervical adenopathy.  Neurological: She is alert.  Skin: Skin is warm and dry. She is not diaphoretic.  Psychiatric: She has a normal mood and affect.   ASSESSMENT/ PLAN:  1. Periorbital cellulitis: will begin clindamycin 300 mg every 8 hours for 10 day;and bleph 10 2 drops both eyes four times daily for 10 days will check cbc.     MD is aware of resident's narcotic use and is in agreement with current plan of care. We will attempt to wean resident as apropriate   Ok Edwards NP [facility]  Contact  [phone] Monday through Friday 8am- 5pm  After hours call [phone]

## 2016-09-26 DIAGNOSIS — H5710 Ocular pain, unspecified eye: Secondary | ICD-10-CM | POA: Diagnosis not present

## 2016-09-26 DIAGNOSIS — H10413 Chronic giant papillary conjunctivitis, bilateral: Secondary | ICD-10-CM | POA: Diagnosis not present

## 2016-09-26 DIAGNOSIS — S31102A Unspecified open wound of abdominal wall, epigastric region without penetration into peritoneal cavity, initial encounter: Secondary | ICD-10-CM | POA: Diagnosis not present

## 2016-09-26 DIAGNOSIS — L98499 Non-pressure chronic ulcer of skin of other sites with unspecified severity: Secondary | ICD-10-CM | POA: Diagnosis not present

## 2016-09-28 ENCOUNTER — Encounter: Payer: Self-pay | Admitting: Adult Health

## 2016-09-28 ENCOUNTER — Non-Acute Institutional Stay (SKILLED_NURSING_FACILITY): Payer: PPO | Admitting: Adult Health

## 2016-09-28 DIAGNOSIS — K5903 Drug induced constipation: Secondary | ICD-10-CM | POA: Diagnosis not present

## 2016-09-28 DIAGNOSIS — T402X5A Adverse effect of other opioids, initial encounter: Secondary | ICD-10-CM

## 2016-09-28 DIAGNOSIS — I82513 Chronic embolism and thrombosis of femoral vein, bilateral: Secondary | ICD-10-CM | POA: Diagnosis not present

## 2016-09-28 DIAGNOSIS — G4700 Insomnia, unspecified: Secondary | ICD-10-CM | POA: Diagnosis not present

## 2016-09-28 DIAGNOSIS — G894 Chronic pain syndrome: Secondary | ICD-10-CM

## 2016-09-28 DIAGNOSIS — T8189XD Other complications of procedures, not elsewhere classified, subsequent encounter: Secondary | ICD-10-CM

## 2016-09-28 NOTE — Progress Notes (Signed)
Provider:  Ok Edwards NP Location:  [facility] Room Number: 105 A Place of Service:  SNF (31)   PCP: Gildardo Cranker, DO Patient Care Team: Gildardo Cranker, DO as PCP - General (Internal Medicine) Nyoka Cowden Phylis Bougie, NP as Nurse Practitioner (Geriatric Medicine) [facility], [facility] ([place])  Extended Emergency Contact Information Primary Emergency Contact: Worthing,Bill Address: [address]          [place], [place] [address] [facility] Phone: [phone] Mobile Phone: [phone] Relation: Spouse Secondary Emergency Contact: Faythe Ghee Address: [address]          [place], [place] [address] Johnnette Litter of [place] Phone: [phone] Mobile Phone: [phone] Relation: Sister  Code Status: full code  Goals of Care: Advanced Directive information Advanced Directives 09/28/2016  Does Patient Have a Medical Advance Directive? Yes  Type of Advance Directive Out of facility DNR (pink MOST or yellow form)  Does patient want to make changes to medical advance directive? No - Patient declined  Copy of [place] in Chart? -  Would patient like information on creating a medical advance directive? -  Pre-existing out of facility DNR order (yellow form or pink MOST form) Yellow form placed in chart (order not valid for inpatient use);Pink MOST form placed in chart (order not valid for inpatient use)      Allergies  Allergen Reactions  . Tape Other (See Comments)    SKIN IS VERY THIN; IT BRUISES AND TEARS EASILY!! -THX     Chief Complaint  Patient presents with  . Medical Management of Chronic Issues    Routine Visit    HPI: Patient is a 83 y.o. female seen today for an annual comprehensive examination. Over this past year she has not required any hospitalizations. She is unable to fully participate in the hpi or ros but can answer simple questions. She is telling me today that she has constipation. She  does spend all of her time in bed per her choice. Staff reports that her appetite is good. Her periorbital areas are nearly resolved.  There are no nursing concerns at this time.     Past Medical History:  Diagnosis Date  . Anemia   . Arthritis   . CHF (congestive heart failure) ([place])   . Depression   . GERD (gastroesophageal reflux disease)   . Hyponatremia 01/2016  . Perforated chronic gastric ulcer ([place]) 08/11/2014  . Pneumonia   . Pulmonary arterial hypertension ([place]) 11/28/2014   Past Surgical History:  Procedure Laterality Date  . ABDOMINAL HYSTERECTOMY    . ABDOMINAL SURGERY    . BACK SURGERY  2000  . COLON SURGERY     x2-blockage  . COLONOSCOPY    . ERCP    . HERNIA REPAIR  2012   x2 with mesh  . JOINT REPLACEMENT Left    shoulder  . LAPAROTOMY N/A 08/04/2014   Procedure: EXPLORATORY LAPAROTOMY, REPAIR OF PERFORATED ULCER;  Surgeon: Excell Seltzer, MD;  Location: [facility];  Service: General;  Laterality: N/A;  . LAPAROTOMY N/A 12/21/2015   Procedure: EXPLORATORY LAPAROTOMY/ REMOVAL OF MESH;  Surgeon: Coralie Keens, MD;  Location: [facility];  Service: General;  Laterality: N/A;  . ORIF WRIST FRACTURE  2012   left  . REVERSE SHOULDER ARTHROPLASTY Right 12/04/2013  . REVERSE SHOULDER ARTHROPLASTY  12/04/2013   Procedure: REVERSE SHOULDER ARTHROPLASTY;  Surgeon: Nita Sells, MD;  Location: [facility];  Service: Orthopedics;;  Right reverse total  shoulder arthroplasty  . ROTATOR CUFF REPAIR     dr Tamera Punt  . SHOULDER ARTHROSCOPY  2012   lt and rt  . TENDON REPAIR Right 04/13/2014   Procedure: RIGHT HAND CENTRAL TENDON CENTRALIZATION OF LONG AND RING FINGERS;  Surgeon: Jolyn Nap, MD;  Location: [facility];  Service: Orthopedics;  Laterality: Right;    reports that she has never smoked. She has never used smokeless tobacco. She reports that she drinks about 4.2 oz of alcohol per week . She reports that she does not use drugs. Social History    Social History  . Marital status: Married    Spouse name: N/A  . Number of children: N/A  . Years of education: N/A   Occupational History  . Not on file.   Social History Main Topics  . Smoking status: Never Smoker  . Smokeless tobacco: Never Used  . Alcohol use 4.2 oz/week    7 Glasses of wine per week     Comment: 8 oz -12 oz of wine every   . Drug use: No  . Sexual activity: Not on file   Other Topics Concern  . Not on file   Social History Narrative  . No narrative on file   Family History  Problem Relation Age of Onset  . Stroke Mother   . Breast cancer Sister   . Ulcerative colitis Daughter   . Chronic fatigue Daughter   . Chronic fatigue Daughter     Vitals:   09/28/16 1407  Temp: 97.8 F (36.6 C)  TempSrc: Oral  SpO2: 97%  Weight: 164 lb (74.4 kg)  Height: _0  (1.6 m)   Body mass index is 29.05 kg/m.    Outpatient Encounter Prescriptions as of 09/28/2016  Medication Sig Note  . acetaminophen (TYLENOL) 325 MG tablet Take 325 mg by mouth every 6 (six) hours as needed for mild pain.    . Amino Acids-Protein Hydrolys (FEEDING SUPPLEMENT, PRO-STAT SUGAR FREE 64,) LIQD Take 30 mLs by mouth 3 (three) times daily with meals.   . bisacodyl (DULCOLAX) 10 MG suppository Place 1 suppository (10 mg total) rectally daily as needed for moderate constipation (May repeat times one).   . clindamycin (CLEOCIN) 300 MG capsule Take 300 mg by mouth 3 (three) times daily.   . diclofenac sodium (VOLTAREN) 1 % GEL Apply 1 g topically 2 (two) times daily. Apply to bilateral knees for pain   . DiphenhydrAMINE HCl (BENADRYL PO) Take 0.5 tablet every 6 hours as needed for itching    . divalproex (DEPAKOTE SPRINKLE) 125 MG capsule Give 4 Capsules (500 mg) by mouth at bedtime   . DULoxetine (CYMBALTA) 60 MG capsule Take 60 mg by mouth at bedtime.    . fluorometholone (FML) 0.1 % ophthalmic suspension Place 1 drop into both eyes 3 (three) times daily.   . fluticasone (FLONASE)  50 MCG/ACT nasal spray Place 1 spray into both nostrils at bedtime.   . gabapentin (NEURONTIN) 300 MG capsule Take 300 mg by mouth 2 (two) times daily.   Marland Kitchen guaiFENesin (MUCINEX) 600 MG 12 hr tablet Take 600 mg by mouth daily.   Marland Kitchen Ketotifen Fumarate (ZADITOR OP) Instill 1 drop in both eyes two times daily for Allergy   . LACTOBACILLUS PO Take 1 capsule by mouth 2 (two) times daily.   . Lidocaine 4 % PTCH Apply to back topically in the morning for Pain and Remove & Discard patch within 12 hours or as directed by  MD   . magnesium hydroxide (MILK OF MAGNESIA) 400 MG/5ML suspension Take 60 mLs by mouth every other day. AS NEEDED FOR CONSTIPATION 08/04/2014: .   . methocarbamol (ROBAXIN) 500 MG tablet Take 500 mg by mouth every 6 (six) hours as needed.    . Multiple Vitamin (MULTIVITAMIN) tablet Take 1 tablet by mouth daily.   . Omega-3 Fatty Acids (FISH OIL) 1000 MG CPDR Take 1,000 mg by mouth daily.    Marland Kitchen omeprazole (PRILOSEC) 20 MG capsule Take 20 mg by mouth daily.   Marland Kitchen oxyCODONE (OXY IR/ROXICODONE) 5 MG immediate release tablet Take 1-2 tablets (5-10 mg total) by mouth every 8 (eight) hours as needed. For pain   . OXYGEN Inhale into the lungs. 2-6 L/min to keep stat at 90 or above   . polyethylene glycol (MIRALAX / GLYCOLAX) packet Take 17 g by mouth daily.    . potassium chloride (KLOR-CON) 20 MEQ packet Take 20 mEq by mouth daily as needed. Take 1 tablet by mouth daily as needed only when Lasix is given   . PROPYLENE GLYCOL OP Instill 2 drops in both eyes three times a day for dry eyes   . rivaroxaban (XARELTO) 20 MG TABS tablet Take 20 mg by mouth at bedtime.   . timolol (BETIMOL) 0.5 % ophthalmic solution Place 1 drop into both eyes 2 (two) times daily.    Marland Kitchen torsemide (DEMADEX) 20 MG tablet Take 2 tablets by mouth once daily   . UNABLE TO FIND House Supplement - Take 4 oz by mouth 2 times daily   . methadone (DOLOPHINE) 5 MG tablet Take 5 mg by mouth at bedtime.   .  temazepam (RESTORIL) 15 MG  capsule Take 15 mg by mouth at bedtime.     No facility-administered encounter medications on file as of 09/28/2016.       SIGNIFICANT DIAGNOSTIC EXAMS  PREVIOUS  01-20-16; chest x-ray: 1. Cardiomegaly and mild interstitial edema. 2. Improved aeration.  01-21-16: ct of abdomen and pelvis: 1. Apparent wall thickening at the rectum could reflect mild proctitis. 2. Small bilateral pleural effusions noted. Bibasilar airspace opacities may reflect atelectasis or possibly infection. 3. Large right-sided hiatal hernia, containing most of the stomach. 4. Mild soft tissue inflammation suggested about the bladder. Would correlate for any evidence of cystitis. 5. Nonspecific soft tissue inflammation suggested about the transverse colon. 6. Mild inflammation about the gallbladder is nonspecific, given the underlying diffuse soft tissue inflammation about the abdomen. 7. Very large anterior abdominal wall defect again noted, with packing material and underlying soft tissue thickening. 8. Scattered diverticulosis along the distal sigmoid colon, without evidence of diverticulitis. 9. Scattered aortic atherosclerosis noted. 10. Right convex thoracolumbar scoliosis noted.   01-21-16 ct of neck: 1. Findings consistent with right parotiditis. No abscess identified. No significant lymphadenopathy. 2. Partially visualized small right pleural effusion and dependent right upper lobe opacity which may represent associated pneumonia or atelectasis.  07-03-16: right hip x-ray:No evidence of fracture or dislocation.   07-03-16: lumbar spine x-ray: No evidence of fracture or subluxation along the lumbar spine. Right convex thoracolumbar scoliosis again noted, with underlying degenerative change.  NO NEW EXAMS   LABS REVIEWED:   12-27-15: pre-albumin 6.9 01-20-16; wbc 13.8; hgb 10.2; hct 32.6; mcv 95.0; plt 447; gluocose 109; bun 14; creat 0.62; k+ 4.7; na++ 131; alk phos 130; albumin 1.9 01-23-16: wbc 15.1; hgb  8.7; hct 27.2; mcv 93.6; plt 381; glucose 92; bun 8; creat 0.62; k+ 3.8; na++ 134 02-15-16:  wbc 9.5; hgb 9.9; hct 31; plt 379; glucose 110; bun 9; creat 0.6; k+ 4.3; na++ 135  05-30-16: glucose 112; bun 12; creat 0.6; k+ 4.3 ;na++ 145 08-07-16: wbc 11.0; hgb 11.1; hct 34; plt 226    NO NEW LABS     Review of Systems  Constitutional: Negative for malaise/fatigue.  Respiratory: Negative for cough.   Cardiovascular: Negative for chest pain.  Gastrointestinal: Positive for constipation. Negative for abdominal pain.  Musculoskeletal: Positive for back pain and myalgias. Negative for joint pain.  Skin: Negative.        Has wound on stomach   Psychiatric/Behavioral: The patient is nervous/anxious.     Physical Exam  Constitutional: No distress.  HENT:  Mouth/Throat: No oropharyngeal exudate.  Eyes: Conjunctivae are normal.  Neck: Neck supple. No JVD present. No thyromegaly present.  Cardiovascular: Normal rate, regular rhythm and intact distal pulses.   Murmur heard. Respiratory: Effort normal and breath sounds normal. No respiratory distress. She has no wheezes.  GI: Soft. Bowel sounds are normal. She exhibits no distension. There is no tenderness.  Musculoskeletal: She exhibits no edema.  Is able to move upper extremities   Lymphadenopathy:    She has no cervical adenopathy.  Neurological: She is alert.  Skin: Skin is warm and dry. She is not diaphoretic.  Abdominal surgical wound without signs ofinfection   Psychiatric: She has a normal mood and affect.    ASSESSMENT/PLAN  TODAY:   1. Chronic pain syndrome: has chronic bilateral low back pain:  Is stable  is on chronic narcotic therapy: will continue methadone 5 mg NIGHTLY robaxin 500 mg four  times daily as needed; neurontin 300 mg twice daily  lidoderm patch to back; and voltaren gel 1 gm twice daily to knees oxycodone 5 or 10 mg every 8 hours as needed    Has  benadryl 12.5 mg every 6 hours as needed for itching.    2.  Constipation: is worse: will increase miralax to twice daily and will continue prn medications    3. Chronic surgical wound due to Status post laparotomy without  removal of mesh: is stable  has large abdominal wound: will continue current treatment is followed by wound doctor   4. Insomnia: is stable  will continue restoril 15 mg nightly   5. Bilateral DVT: femoral veins:is stable  will continue xarelto 20 mg daily    6. CHF:EF 55-60% (06-21-14) is stable will continue demadex 20 mg daily with k+ 20 meq daily is  02 dependent   7. Gerd: stable  will continue prilosec 20 mg daily   8. Depression with anxiety:is stable  will continue cymbalta 60 mg daily takes depakote 500 mg nightly to stabilize mood.   9. Glaucoma: is without change  will continue timolol to both eyes twice daily   10. Allergic rhinits: is stable  will continue flonase and mucinex daily   11. Pulmonary arterial hypertension: b/p 125/67  Her health maintenance is up to date.   Time spent with patient  45   minutes >50% time spent coordinating care with nursing staff; informing nursing of her current status and reviewing her care ; reviewing medical record; tests; labs; and developing future plan of care   MD is aware of resident's narcotic use and is in agreement with current plan of care. We will wean dosage as appropriate for resident   Ok Edwards NP [facility]  Contact [phone] Monday through Friday 8am- 5pm  After hours call  [phone]

## 2016-10-03 DIAGNOSIS — L98499 Non-pressure chronic ulcer of skin of other sites with unspecified severity: Secondary | ICD-10-CM | POA: Diagnosis not present

## 2016-10-04 DIAGNOSIS — L03213 Periorbital cellulitis: Secondary | ICD-10-CM | POA: Insufficient documentation

## 2016-10-10 DIAGNOSIS — S0591XA Unspecified injury of right eye and orbit, initial encounter: Secondary | ICD-10-CM | POA: Diagnosis not present

## 2016-10-10 DIAGNOSIS — H10413 Chronic giant papillary conjunctivitis, bilateral: Secondary | ICD-10-CM | POA: Diagnosis not present

## 2016-10-10 DIAGNOSIS — H5710 Ocular pain, unspecified eye: Secondary | ICD-10-CM | POA: Diagnosis not present

## 2016-10-10 DIAGNOSIS — L98499 Non-pressure chronic ulcer of skin of other sites with unspecified severity: Secondary | ICD-10-CM | POA: Diagnosis not present

## 2016-10-12 ENCOUNTER — Other Ambulatory Visit: Payer: Self-pay

## 2016-10-12 MED ORDER — TEMAZEPAM 15 MG PO CAPS: 15.0000 mg | ORAL_CAPSULE | Freq: Every day | ORAL | 0 refills | Status: DC

## 2016-10-12 MED ORDER — METHADONE HCL 5 MG PO TABS
5.0000 mg | ORAL_TABLET | Freq: Every day | ORAL | 0 refills | Status: DC
Start: 2016-10-12 — End: 2016-11-13

## 2016-10-14 DIAGNOSIS — I82513 Chronic embolism and thrombosis of femoral vein, bilateral: Secondary | ICD-10-CM | POA: Insufficient documentation

## 2016-10-17 DIAGNOSIS — H539 Unspecified visual disturbance: Secondary | ICD-10-CM | POA: Diagnosis not present

## 2016-10-17 DIAGNOSIS — H10413 Chronic giant papillary conjunctivitis, bilateral: Secondary | ICD-10-CM | POA: Diagnosis not present

## 2016-10-17 DIAGNOSIS — H5710 Ocular pain, unspecified eye: Secondary | ICD-10-CM | POA: Diagnosis not present

## 2016-10-17 DIAGNOSIS — L98492 Non-pressure chronic ulcer of skin of other sites with fat layer exposed: Secondary | ICD-10-CM | POA: Diagnosis not present

## 2016-10-20 DIAGNOSIS — I82413 Acute embolism and thrombosis of femoral vein, bilateral: Secondary | ICD-10-CM | POA: Insufficient documentation

## 2016-10-24 DIAGNOSIS — L98499 Non-pressure chronic ulcer of skin of other sites with unspecified severity: Secondary | ICD-10-CM | POA: Diagnosis not present

## 2016-10-27 ENCOUNTER — Other Ambulatory Visit: Payer: Self-pay

## 2016-10-27 ENCOUNTER — Encounter: Payer: Self-pay | Admitting: Adult Health

## 2016-10-27 ENCOUNTER — Non-Acute Institutional Stay (SKILLED_NURSING_FACILITY): Payer: PPO | Admitting: Adult Health

## 2016-10-27 DIAGNOSIS — K219 Gastro-esophageal reflux disease without esophagitis: Secondary | ICD-10-CM

## 2016-10-27 DIAGNOSIS — G4700 Insomnia, unspecified: Secondary | ICD-10-CM

## 2016-10-27 DIAGNOSIS — K5903 Drug induced constipation: Secondary | ICD-10-CM

## 2016-10-27 DIAGNOSIS — F329 Major depressive disorder, single episode, unspecified: Secondary | ICD-10-CM | POA: Diagnosis not present

## 2016-10-27 DIAGNOSIS — T402X5A Adverse effect of other opioids, initial encounter: Secondary | ICD-10-CM | POA: Diagnosis not present

## 2016-10-27 DIAGNOSIS — I509 Heart failure, unspecified: Secondary | ICD-10-CM | POA: Diagnosis not present

## 2016-10-27 DIAGNOSIS — F32A Depression, unspecified: Secondary | ICD-10-CM

## 2016-10-27 MED ORDER — TEMAZEPAM 30 MG PO CAPS: 30.0000 mg | ORAL_CAPSULE | Freq: Every evening | ORAL | 0 refills | Status: DC | PRN

## 2016-10-27 NOTE — Progress Notes (Signed)
Location:   [facility] Room Number: 105 A Place of Service:  SNF (31)   CODE STATUS: DNR  Allergies  Allergen Reactions  . Tape Other (See Comments)    SKIN IS VERY THIN; IT BRUISES AND TEARS EASILY!! -THX    Chief Complaint  Patient presents with  . Medical Management of Chronic Issues    1 month follow up    HPI:  She is an 83 year old long term resident of this facility being seen for the management of her chronic illnesses: constipation; insomnia; chf; gerd and depression with anxiety . She is bed nearly all of the time. She has chronic pain for which she tells me that her pain is being adequately managed. She is having complaints of constipation and insomnia. She tells me that she does get abdominal pain from her constipation. She also says that she is up all night and does sleep more in the day time. Her current regimen for her constipation and insomnia are not effective. There are no nursing concerns at this time   Past Medical History:  Diagnosis Date  . Anemia   . Arthritis   . CHF (congestive heart failure) ([place])   . Depression   . GERD (gastroesophageal reflux disease)   . Hyponatremia 01/2016  . Perforated chronic gastric ulcer ([place]) 08/11/2014  . Pneumonia   . Pulmonary arterial hypertension ([place]) 11/28/2014    Past Surgical History:  Procedure Laterality Date  . ABDOMINAL HYSTERECTOMY    . ABDOMINAL SURGERY    . BACK SURGERY  2000  . COLON SURGERY     x2-blockage  . COLONOSCOPY    . ERCP    . HERNIA REPAIR  2012   x2 with mesh  . JOINT REPLACEMENT Left    shoulder  . LAPAROTOMY N/A 08/04/2014   Procedure: EXPLORATORY LAPAROTOMY, REPAIR OF PERFORATED ULCER;  Surgeon: Excell Seltzer, MD;  Location: [facility];  Service: General;  Laterality: N/A;  . LAPAROTOMY N/A 12/21/2015   Procedure: EXPLORATORY LAPAROTOMY/ REMOVAL OF MESH;  Surgeon: Coralie Keens, MD;  Location: [facility];  Service: General;  Laterality: N/A;  . ORIF WRIST FRACTURE  2012     left  . REVERSE SHOULDER ARTHROPLASTY Right 12/04/2013  . REVERSE SHOULDER ARTHROPLASTY  12/04/2013   Procedure: REVERSE SHOULDER ARTHROPLASTY;  Surgeon: Nita Sells, MD;  Location: [facility] OR;  Service: Orthopedics;;  Right reverse total shoulder arthroplasty  . ROTATOR CUFF REPAIR     dr Tamera Punt  . SHOULDER ARTHROSCOPY  2012   lt and rt  . TENDON REPAIR Right 04/13/2014   Procedure: RIGHT HAND CENTRAL TENDON CENTRALIZATION OF LONG AND RING FINGERS;  Surgeon: Jolyn Nap, MD;  Location: [facility];  Service: Orthopedics;  Laterality: Right;    Social History   Social History  . Marital status: Married    Spouse name: N/A  . Number of children: N/A  . Years of education: N/A   Occupational History  . Not on file.   Social History Main Topics  . Smoking status: Never Smoker  . Smokeless tobacco: Never Used  . Alcohol use 4.2 oz/week    7 Glasses of wine per week     Comment: 8 oz -12 oz of wine every   . Drug use: No  . Sexual activity: Not on file   Other Topics Concern  . Not on file   Social History Narrative  . No narrative on file   Family History  Problem Relation Age of Onset  . Stroke Mother   . Breast cancer Sister   . Ulcerative colitis Daughter   . Chronic fatigue Daughter   . Chronic fatigue Daughter       VITAL SIGNS BP 118/64   Pulse 68   Temp 97.7 F (36.5 C)   Resp 18   Ht '5\' 3"'  (1.6 m)   Wt 168 lb 3.2 oz (76.3 kg)   SpO2 97%   BMI 29.80 kg/m   Patient's Medications  New Prescriptions   No medications on file  Previous Medications   ACETAMINOPHEN (TYLENOL) 325 MG TABLET    Take 325 mg by mouth every 6 (six) hours as needed for mild pain.    AMINO ACIDS-PROTEIN HYDROLYS (FEEDING SUPPLEMENT, PRO-STAT SUGAR FREE 64,) LIQD    Take 30 mLs by mouth 3 (three) times daily with meals.   BISACODYL (DULCOLAX) 10 MG SUPPOSITORY    Place 1 suppository (10 mg total) rectally daily as needed for moderate constipation (May  repeat times one).   DICLOFENAC SODIUM (VOLTAREN) 1 % GEL    Apply 1 g topically 2 (two) times daily. Apply to bilateral knees for pain   DIPHENHYDRAMINE HCL (BENADRYL PO)    Take 0.5 tablet every 6 hours as needed for itching    DIVALPROEX (DEPAKOTE SPRINKLE) 125 MG CAPSULE    Give 4 Capsules (500 mg) by mouth at bedtime   DULOXETINE (CYMBALTA) 60 MG CAPSULE    Take 60 mg by mouth at bedtime.    FLUOROMETHOLONE (FML) 0.1 % OPHTHALMIC SUSPENSION    Place 1 drop into both eyes 3 (three) times daily.   FLUTICASONE (FLONASE) 50 MCG/ACT NASAL SPRAY    Place 1 spray into both nostrils at bedtime.   GABAPENTIN (NEURONTIN) 300 MG CAPSULE    Take 300 mg by mouth 2 (two) times daily.   GUAIFENESIN (MUCINEX) 600 MG 12 HR TABLET    Take 600 mg by mouth daily.   KETOTIFEN FUMARATE (ZADITOR OP)    Instill 1 drop in both eyes two times daily for Allergy   LIDOCAINE 4 % PTCH    Apply to back topically in the morning for Pain and Remove & Discard patch within 12 hours or as directed by MD   MAGNESIUM HYDROXIDE (MILK OF MAGNESIA) 400 MG/5ML SUSPENSION    Take 60 mLs by mouth every other day. AS NEEDED FOR CONSTIPATION   METHADONE (DOLOPHINE) 5 MG TABLET    Take 1 tablet (5 mg total) by mouth at bedtime.   METHOCARBAMOL (ROBAXIN) 500 MG TABLET    Take 500 mg by mouth every 6 (six) hours as needed.    MULTIPLE VITAMIN (MULTIVITAMIN) TABLET    Take 1 tablet by mouth daily.   OMEGA-3 FATTY ACIDS (FISH OIL) 1000 MG CPDR    Take 1,000 mg by mouth daily.    OMEPRAZOLE (PRILOSEC) 20 MG CAPSULE    Take 20 mg by mouth daily.   OXYCODONE (OXY IR/ROXICODONE) 5 MG IMMEDIATE RELEASE TABLET    Take 1-2 tablets (5-10 mg total) by mouth every 8 (eight) hours as needed. For pain   OXYGEN    Inhale into the lungs. 2-6 L/min to keep stat at 90 or above   POLYETHYLENE GLYCOL (MIRALAX / GLYCOLAX) PACKET    Take 17 g by mouth 2 (two) times daily as needed for mild constipation.    POTASSIUM CHLORIDE (KLOR-CON) 20 MEQ PACKET    Take 20  mEq by mouth daily as  needed. Take 1 tablet by mouth daily as needed only when Lasix is given   RIVAROXABAN (XARELTO) 20 MG TABS TABLET    Take 20 mg by mouth at bedtime.   TEMAZEPAM (RESTORIL) 15 MG CAPSULE    Take 1 capsule (15 mg total) by mouth at bedtime.   TIMOLOL (BETIMOL) 0.5 % OPHTHALMIC SOLUTION    Place 1 drop into both eyes 2 (two) times daily.    TORSEMIDE (DEMADEX) 20 MG TABLET    Take 2 tablets by mouth once daily   UNABLE TO FIND    House Supplement - Take 4 oz by mouth 2 times daily  Modified Medications   No medications on file  Discontinued Medications   LACTOBACILLUS PO    Take 1 capsule by mouth 2 (two) times daily.   TACROLIMUS (PROTOPIC) 0.1 % OINTMENT    Apply to affected areas topically two times daily     SIGNIFICANT DIAGNOSTIC EXAMS  PREVIOUS  01-20-16; chest x-ray: 1. Cardiomegaly and mild interstitial edema. 2. Improved aeration.  01-21-16: ct of abdomen and pelvis: 1. Apparent wall thickening at the rectum could reflect mild proctitis. 2. Small bilateral pleural effusions noted. Bibasilar airspace opacities may reflect atelectasis or possibly infection. 3. Large right-sided hiatal hernia, containing most of the stomach. 4. Mild soft tissue inflammation suggested about the bladder. Would correlate for any evidence of cystitis. 5. Nonspecific soft tissue inflammation suggested about the transverse colon. 6. Mild inflammation about the gallbladder is nonspecific, given the underlying diffuse soft tissue inflammation about the abdomen. 7. Very large anterior abdominal wall defect again noted, with packing material and underlying soft tissue thickening. 8. Scattered diverticulosis along the distal sigmoid colon, without evidence of diverticulitis. 9. Scattered aortic atherosclerosis noted. 10. Right convex thoracolumbar scoliosis noted.   01-21-16 ct of neck: 1. Findings consistent with right parotiditis. No abscess identified. No significant lymphadenopathy. 2.  Partially visualized small right pleural effusion and dependent right upper lobe opacity which may represent associated pneumonia or atelectasis.  07-03-16: right hip x-ray:No evidence of fracture or dislocation.   07-03-16: lumbar spine x-ray: No evidence of fracture or subluxation along the lumbar spine. Right convex thoracolumbar scoliosis again noted, with underlying degenerative change.  NO NEW EXAMS   LABS REVIEWED:PREVIOUS    12-27-15: pre-albumin 6.9 01-20-16; wbc 13.8; hgb 10.2; hct 32.6; mcv 95.0; plt 447; gluocose 109; bun 14; creat 0.62; k+ 4.7; na++ 131; alk phos 130; albumin 1.9 01-23-16: wbc 15.1; hgb 8.7; hct 27.2; mcv 93.6; plt 381; glucose 92; bun 8; creat 0.62; k+ 3.8; na++ 134 02-15-16: wbc 9.5; hgb 9.9; hct 31; plt 379; glucose 110; bun 9; creat 0.6; k+ 4.3; na++ 135  05-30-16: glucose 112; bun 12; creat 0.6; k+ 4.3 ;na++ 145 08-07-16: wbc 11.0; hgb 11.1; hct 34; plt 226    NO NEW LABS     Review of Systems  Constitutional: Negative for malaise/fatigue.  Respiratory: Negative for cough and shortness of breath.   Cardiovascular: Negative for chest pain, palpitations and leg swelling.  Gastrointestinal: Positive for constipation. Negative for abdominal pain and heartburn.  Musculoskeletal: Positive for back pain and myalgias. Negative for joint pain.       Her pain is being managed at this time   Skin: Negative.   Neurological: Negative for dizziness.  Psychiatric/Behavioral: The patient has insomnia. The patient is not nervous/anxious.      Physical Exam  Constitutional: No distress.  Eyes: Conjunctivae are normal.  Neck: Neck supple. No JVD present.  No thyromegaly present.  Cardiovascular: Normal rate, regular rhythm and intact distal pulses.   Murmur heard. Respiratory: Effort normal and breath sounds normal. No respiratory distress. She has no wheezes.  GI: Soft. Bowel sounds are normal. She exhibits no distension. There is no tenderness.  Musculoskeletal:  She exhibits no edema.  Is able to move upper extremities   Lymphadenopathy:    She has no cervical adenopathy.  Neurological: She is alert.  Skin: Skin is warm and dry. She is not diaphoretic.  Abdominal wound: 7.8 x 10.8 cm treated with betadine wet to dry.   Psychiatric: She has a normal mood and affect.    ASSESSMENT/PLAN  TODAY:   1. Constipation: no change: will continue MOM 30 cc every other day as needed; will change her miralax to 17 gm daily will monitor   2. Insomnia: is worse will increase her restoril to 30 mg nightly   3. CHF:EF 55-60% (06-21-14) is stable will continue demadex 20 mg daily with k+ 20 meq daily is  02 dependent   4. Gerd: stable  will continue prilosec 20 mg daily   5. Depression with anxiety:is stable  will continue cymbalta 60 mg daily takes depakote 500 mg nightly to stabilize mood.   6. Glaucoma: is without change  will continue timolol to both eyes twice daily   7. Allergic rhinits: is stable  will continue flonase and mucinex daily   8. Pulmonary arterial hypertension: b/p 118/64     9. Chronic pain syndrome: has chronic bilateral low back pain:  Is stable  is on chronic narcotic therapy: will continue methadone 5 mg NIGHTLY robaxin 500 mg four  times daily as needed; neurontin 300 mg twice daily  lidoderm patch to back; and voltaren gel 1 gm twice daily to knees oxycodone 5 or 10 mg every 8 hours as needed    Has  benadryl 12.5 mg every 6 hours as needed for itching.    10. Constipation: no change: will continue MOM 30 cc every other day as needed; will change her miralax to 17 gm daily will monitor   11.  Chronic surgical wound due to Status post laparotomy without  removal of mesh: is stable  has large abdominal wound: will continue current treatment is followed by wound doctor   12. Insomnia: is worse will increase her restoril to 30 mg nightly   13. Bilateral DVT: femoral veins:is stable  will continue xarelto 20 mg daily   Will check cmp      MD is aware of resident's narcotic use and is in agreement with current plan of care. We will attempt to wean resident as apropriate     Ok Edwards NP [facility]  Contact [phone] Monday through Friday 8am- 5pm  After hours call [phone]

## 2016-10-27 NOTE — Telephone Encounter (Signed)
RX faxed to [facility] @ [phone], phone number [phone] 

## 2016-10-28 DIAGNOSIS — D649 Anemia, unspecified: Secondary | ICD-10-CM | POA: Diagnosis not present

## 2016-10-28 DIAGNOSIS — I5030 Unspecified diastolic (congestive) heart failure: Secondary | ICD-10-CM | POA: Diagnosis not present

## 2016-10-28 LAB — BASIC METABOLIC PANEL
BUN: 15 (ref 4–21)
Creatinine: 0.5 (ref 0.5–1.1)
Glucose: 97
Potassium: 4.3 (ref 3.4–5.3)
Sodium: 132 — AB (ref 137–147)

## 2016-10-28 LAB — HEPATIC FUNCTION PANEL
ALT: 6 — AB (ref 7–35)
AST: 11 — AB (ref 13–35)
Alkaline Phosphatase: 73 (ref 25–125)
BILIRUBIN, TOTAL: 0.2

## 2016-10-30 DIAGNOSIS — L98499 Non-pressure chronic ulcer of skin of other sites with unspecified severity: Secondary | ICD-10-CM | POA: Diagnosis not present

## 2016-10-30 DIAGNOSIS — S3092XA Unspecified superficial injury of abdominal wall, initial encounter: Secondary | ICD-10-CM | POA: Diagnosis not present

## 2016-10-30 DIAGNOSIS — K5720 Diverticulitis of large intestine with perforation and abscess without bleeding: Secondary | ICD-10-CM | POA: Diagnosis not present

## 2016-10-30 DIAGNOSIS — T8579XS Infection and inflammatory reaction due to other internal prosthetic devices, implants and grafts, sequela: Secondary | ICD-10-CM | POA: Diagnosis not present

## 2016-11-07 DIAGNOSIS — L98493 Non-pressure chronic ulcer of skin of other sites with necrosis of muscle: Secondary | ICD-10-CM | POA: Diagnosis not present

## 2016-11-09 ENCOUNTER — Non-Acute Institutional Stay (SKILLED_NURSING_FACILITY): Payer: PPO | Admitting: Internal Medicine

## 2016-11-09 ENCOUNTER — Encounter: Payer: Self-pay | Admitting: Internal Medicine

## 2016-11-09 DIAGNOSIS — L03116 Cellulitis of left lower limb: Secondary | ICD-10-CM

## 2016-11-09 DIAGNOSIS — E43 Unspecified severe protein-calorie malnutrition: Secondary | ICD-10-CM

## 2016-11-09 DIAGNOSIS — L0390 Cellulitis, unspecified: Secondary | ICD-10-CM | POA: Diagnosis not present

## 2016-11-09 NOTE — Progress Notes (Signed)
Patient ID: Monique Russo, female   DOB: 04/14/1930, 83 y.o.   MRN: [id number]     DATE:  November 09, 2016  Location:   [facility] Room Number: 105 A Place of Service: SNF (31)   Extended Emergency Contact Information Primary Emergency Contact: Suttles,Bill Address: [address]          [place], [place] [address] [place] Phone: [phone] Mobile Phone: [phone] Relation: Spouse Secondary Emergency Contact: Faythe Ghee Address: [address]          [place], [place] [address] [place] Phone: [phone] Mobile Phone: [phone] Relation: Sister  Advanced Directive information Does Patient Have a Medical Advance Directive?: Yes, Would patient like information on creating a medical advance directive?: No - Patient declined, Type of Advance Directive: Out of facility DNR (pink MOST or yellow form), Pre-existing out of facility DNR order (yellow form or pink MOST form): Yellow form placed in chart (order not valid for inpatient use);Pink MOST form placed in chart (order not valid for inpatient use), Does patient want to make changes to medical advance directive?: No - Patient declined  Chief Complaint  Patient presents with  . Acute Visit    Wound on left foot    HPI:  84 yo female long term resident seen today for new left foot wound noted by nursing yesterday. No known trauma. No f/c. Area is painful. No d/c. Wound care applied dsg yesterday. She has a chronic abdominal wound that is also followed by wound care. Albumin 2.6 and she gets nutritional supplements per facility protocol.  Past Medical History:  Diagnosis Date  . Anemia   . Arthritis   . CHF (congestive heart failure) ([place])   . Depression   . GERD (gastroesophageal reflux disease)   . Hyponatremia 01/2016  . Perforated chronic gastric ulcer ([place]) 08/11/2014  . Pneumonia   . Pulmonary arterial hypertension ([place]) 11/28/2014    Past Surgical History:    Procedure Laterality Date  . ABDOMINAL HYSTERECTOMY    . ABDOMINAL SURGERY    . BACK SURGERY  2000  . COLON SURGERY     x2-blockage  . COLONOSCOPY    . ERCP    . HERNIA REPAIR  2012   x2 with mesh  . JOINT REPLACEMENT Left    shoulder  . LAPAROTOMY N/A 08/04/2014   Procedure: EXPLORATORY LAPAROTOMY, REPAIR OF PERFORATED ULCER;  Surgeon: Excell Seltzer, MD;  Location: [facility];  Service: General;  Laterality: N/A;  . LAPAROTOMY N/A 12/21/2015   Procedure: EXPLORATORY LAPAROTOMY/ REMOVAL OF MESH;  Surgeon: Coralie Keens, MD;  Location: [facility];  Service: General;  Laterality: N/A;  . ORIF WRIST FRACTURE  2012   left  . REVERSE SHOULDER ARTHROPLASTY Right 12/04/2013  . REVERSE SHOULDER ARTHROPLASTY  12/04/2013   Procedure: REVERSE SHOULDER ARTHROPLASTY;  Surgeon: Nita Sells, MD;  Location: [facility] OR;  Service: Orthopedics;;  Right reverse total shoulder arthroplasty  . ROTATOR CUFF REPAIR     dr Tamera Punt  . SHOULDER ARTHROSCOPY  2012   lt and rt  . TENDON REPAIR Right 04/13/2014   Procedure: RIGHT HAND CENTRAL TENDON CENTRALIZATION OF LONG AND RING FINGERS;  Surgeon: Jolyn Nap, MD;  Location: [facility];  Service: Orthopedics;  Laterality: Right;    Patient Care Team: Gildardo Cranker, DO as PCP - General (Internal Medicine) Nyoka Cowden Phylis Bougie, NP as Nurse Practitioner ([place]) [facility] [place] ([place]  Social  History   Social History  . Marital status: Married    Spouse name: N/A  . Number of children: N/A  . Years of education: N/A   Occupational History  . Not on file.   Social History Main Topics  . Smoking status: Never Smoker  . Smokeless tobacco: Never Used  . Alcohol use 4.2 oz/week    7 Glasses of wine per week     Comment: 8 oz -12 oz of wine every   . Drug use: No  . Sexual activity: Not on file   Other Topics Concern  . Not on file   Social History Narrative  . No narrative on file      reports that she has never smoked. She has never used smokeless tobacco. She reports that she drinks about 4.2 oz of alcohol per week . She reports that she does not use drugs.  Family History  Problem Relation Age of Onset  . Stroke Mother   . Breast cancer Sister   . Ulcerative colitis Daughter   . Chronic fatigue Daughter   . Chronic fatigue Daughter    Family Status  Relation Status  . Mother Deceased  . Father Deceased at age 67       accident  . Sister Alive  . Brother Alive  . Sister Alive  . Sister Alive  . Brother Alive  . Son Alive  . Daughter Alive  . Daughter Alive  . Son Alive    Immunization History  Administered Date(s) Administered  . Influenza Split 02/03/2015    Allergies  Allergen Reactions  . Tape Other (See Comments)    SKIN IS VERY THIN; IT BRUISES AND TEARS EASILY!! -THX    Medications: Patient's Medications  New Prescriptions   No medications on file  Previous Medications   ACETAMINOPHEN (TYLENOL) 325 MG TABLET    Take 325 mg by mouth every 6 (six) hours as needed for mild pain.    AMINO ACIDS-PROTEIN HYDROLYS (FEEDING SUPPLEMENT, PRO-STAT SUGAR FREE 64,) LIQD    Take 30 mLs by mouth 3 (three) times daily with meals.   BISACODYL (DULCOLAX) 10 MG SUPPOSITORY    Place 1 suppository (10 mg total) rectally daily as needed for moderate constipation (May repeat times one).   DICLOFENAC SODIUM (VOLTAREN) 1 % GEL    Apply 1 g topically 2 (two) times daily. Apply to bilateral knees for pain   DIPHENHYDRAMINE HCL (BENADRYL PO)    Take 0.5 tablet every 6 hours as needed for itching    DIVALPROEX (DEPAKOTE SPRINKLE) 125 MG CAPSULE    Give 4 Capsules (500 mg) by mouth at bedtime   DULOXETINE (CYMBALTA) 60 MG CAPSULE    Take 60 mg by mouth at bedtime.    FLUOROMETHOLONE (FML) 0.1 % OPHTHALMIC SUSPENSION    Place 1 drop into both eyes 3 (three) times daily.   FLUTICASONE (FLONASE) 50 MCG/ACT NASAL SPRAY    Place 1 spray into both nostrils at bedtime.    GABAPENTIN (NEURONTIN) 300 MG CAPSULE    Take 300 mg by mouth 2 (two) times daily.   GUAIFENESIN (MUCINEX) 600 MG 12 HR TABLET    Take 600 mg by mouth daily.   KETOTIFEN FUMARATE (ZADITOR OP)    Instill 1 drop in both eyes two times daily for Allergy   MAGNESIUM HYDROXIDE (MILK OF MAGNESIA) 400 MG/5ML SUSPENSION    Take 60 mLs by mouth every other day. AS NEEDED FOR CONSTIPATION   METHADONE (DOLOPHINE) 5  MG TABLET    Take 1 tablet (5 mg total) by mouth at bedtime.   METHOCARBAMOL (ROBAXIN) 500 MG TABLET    Take 500 mg by mouth every 6 (six) hours as needed.    MULTIPLE VITAMIN (MULTIVITAMIN) TABLET    Take 1 tablet by mouth daily.   OMEGA-3 FATTY ACIDS (FISH OIL) 1000 MG CPDR    Take 1,000 mg by mouth daily.    OMEPRAZOLE (PRILOSEC) 20 MG CAPSULE    Take 20 mg by mouth daily.   OXYCODONE (OXY IR/ROXICODONE) 5 MG IMMEDIATE RELEASE TABLET    Take 1-2 tablets (5-10 mg total) by mouth every 8 (eight) hours as needed. For pain   OXYGEN    Inhale into the lungs. 2-6 L/min to keep stat at 90 or above   POLYETHYLENE GLYCOL (MIRALAX / GLYCOLAX) PACKET    Take 17 g by mouth daily.    RIVAROXABAN (XARELTO) 20 MG TABS TABLET    Take 20 mg by mouth at bedtime.   TEMAZEPAM (RESTORIL) 30 MG CAPSULE    Take 1 capsule (30 mg total) by mouth at bedtime as needed for sleep.   TIMOLOL (BETIMOL) 0.5 % OPHTHALMIC SOLUTION    Place 1 drop into both eyes 2 (two) times daily.    TORSEMIDE (DEMADEX) 20 MG TABLET    Take 2 tablets by mouth once daily   UNABLE TO FIND    House Supplement - Take 4 oz by mouth 2 times daily  Modified Medications   No medications on file  Discontinued Medications   LIDOCAINE 4 % PTCH    Apply to back topically in the morning for Pain and Remove & Discard patch within 12 hours or as directed by MD   POTASSIUM CHLORIDE (KLOR-CON) 20 MEQ PACKET    Take 20 mEq by mouth daily as needed. Take 1 tablet by mouth daily as needed only when Lasix is given    Review of Systems  Cardiovascular:  Positive for leg swelling.  Skin: Positive for wound.  All other systems reviewed and are negative.   Vitals:   11/09/16 1135  BP: 114/67  Pulse: 89  Resp: 18  Temp: (!) 97 F (36.1 C)  SpO2: 96%  Weight: 168 lb 3.2 oz (76.3 kg)  Height: 5\' 3"  (1.6 m)   Body mass index is 29.8 kg/m.  Physical Exam  Constitutional: She is oriented to person, place, and time.  Cardiovascular:  +1 pitting Left foot edema; trace RLE edema. No calf TTP b/l.  Musculoskeletal: She exhibits edema and tenderness.  Neurological: She is alert and oriented to person, place, and time.  Skin: Skin is warm and dry. Rash noted.     Psychiatric: She has a normal mood and affect. Her behavior is normal. Thought content normal.     Labs reviewed: Abstract on 10/30/2016  Component Date Value Ref Range Status  . Glucose 10/28/2016 97   Final  . BUN 10/28/2016 15  4 - 21 Final  . Creatinine 10/28/2016 0.5  0.5 - 1.1 Final  . Potassium 10/28/2016 4.3  3.4 - 5.3 Final  . Sodium 10/28/2016 132* 137 - 147 Final  . Alkaline Phosphatase 10/28/2016 73  25 - 125 Final  . ALT 10/28/2016 6* 7 - 35 Final  . AST 10/28/2016 11* 13 - 35 Final  . Bilirubin, Total 10/28/2016 0.2   Final   <    No results found.   Assessment/Plan   ICD-10-CM   1. Cellulitis of left  lower extremity -NEW L03.116   2. Wound cellulitis - NEW L03.90    left foot  3. Protein-calorie malnutrition, severe ([place]) E43     Start keflex 500mg  po bid x 10days with floraster po bid x 4 weeks  Wound care as ordered - use topical abx then cover with nonstick pad and wrap with guaze. Wound provider to follow  Cont other meds as ordered  Cont nutritional supplements per facility protocol  Practice good hygiene  Will follow   Rafaella Kole S. Perlie Gold  [facility] [address] [place], [place] [address] [phone] Cell (Monday-Friday 8 AM - 5 PM) [phone] After 5 PM and follow  prompts

## 2016-11-13 ENCOUNTER — Other Ambulatory Visit: Payer: Self-pay

## 2016-11-13 MED ORDER — METHADONE HCL 5 MG PO TABS
5.0000 mg | ORAL_TABLET | Freq: Every day | ORAL | 0 refills | Status: DC
Start: 2016-11-13 — End: 2016-12-18

## 2016-11-13 NOTE — Telephone Encounter (Signed)
RX faxed to [facility] @ [phone], phone number [phone] 

## 2016-11-14 DIAGNOSIS — L98499 Non-pressure chronic ulcer of skin of other sites with unspecified severity: Secondary | ICD-10-CM | POA: Diagnosis not present

## 2016-11-15 DIAGNOSIS — L8992 Pressure ulcer of unspecified site, stage 2: Secondary | ICD-10-CM | POA: Diagnosis not present

## 2016-11-15 DIAGNOSIS — L245 Irritant contact dermatitis due to other chemical products: Secondary | ICD-10-CM | POA: Diagnosis not present

## 2016-11-15 DIAGNOSIS — B999 Unspecified infectious disease: Secondary | ICD-10-CM | POA: Diagnosis not present

## 2016-11-15 DIAGNOSIS — R21 Rash and other nonspecific skin eruption: Secondary | ICD-10-CM | POA: Diagnosis not present

## 2016-11-21 DIAGNOSIS — L98499 Non-pressure chronic ulcer of skin of other sites with unspecified severity: Secondary | ICD-10-CM | POA: Diagnosis not present

## 2016-11-24 ENCOUNTER — Other Ambulatory Visit: Payer: Self-pay

## 2016-11-24 MED ORDER — TEMAZEPAM 30 MG PO CAPS: 30.0000 mg | ORAL_CAPSULE | Freq: Every evening | ORAL | 0 refills | Status: DC | PRN

## 2016-11-24 NOTE — Telephone Encounter (Signed)
RX faxed to [facility] @ [phone], phone number [phone] 

## 2016-11-27 ENCOUNTER — Non-Acute Institutional Stay (SKILLED_NURSING_FACILITY): Payer: PPO | Admitting: Internal Medicine

## 2016-11-27 ENCOUNTER — Encounter: Payer: Self-pay | Admitting: Internal Medicine

## 2016-11-27 DIAGNOSIS — Z86718 Personal history of other venous thrombosis and embolism: Secondary | ICD-10-CM | POA: Diagnosis not present

## 2016-11-27 DIAGNOSIS — E43 Unspecified severe protein-calorie malnutrition: Secondary | ICD-10-CM | POA: Diagnosis not present

## 2016-11-27 DIAGNOSIS — L989 Disorder of the skin and subcutaneous tissue, unspecified: Secondary | ICD-10-CM | POA: Diagnosis not present

## 2016-11-27 DIAGNOSIS — L089 Local infection of the skin and subcutaneous tissue, unspecified: Secondary | ICD-10-CM | POA: Diagnosis not present

## 2016-11-27 DIAGNOSIS — S31109S Unspecified open wound of abdominal wall, unspecified quadrant without penetration into peritoneal cavity, sequela: Secondary | ICD-10-CM

## 2016-11-27 DIAGNOSIS — R600 Localized edema: Secondary | ICD-10-CM

## 2016-11-27 DIAGNOSIS — S90822A Blister (nonthermal), left foot, initial encounter: Secondary | ICD-10-CM

## 2016-11-27 DIAGNOSIS — I509 Heart failure, unspecified: Secondary | ICD-10-CM | POA: Diagnosis not present

## 2016-11-27 DIAGNOSIS — M545 Low back pain: Secondary | ICD-10-CM | POA: Diagnosis not present

## 2016-11-27 DIAGNOSIS — G8929 Other chronic pain: Secondary | ICD-10-CM

## 2016-11-27 NOTE — Progress Notes (Signed)
Patient ID: Monique Russo, female   DOB: 05/16/1930, 83 y.o.   MRN: [id number]     DATE:  November 27, 2016  Location:   [facility] Room Number: 105 A Place of Service: SNF (31)   Extended Emergency Contact Information Primary Emergency Contact: Ruelas,Bill Address: [address]          [place], [place] [address] [place] Phone: [phone] Mobile Phone: [phone] Relation: Spouse Secondary Emergency Contact: Faythe Ghee Address: [address]          [place], [place] [address] [place] of [place] Phone: [phone] Mobile Phone: [phone] Relation: Sister  Advanced Directive information Does Patient Have a Medical Advance Directive?: Yes, Would patient like information on creating a medical advance directive?: No - Patient declined, Type of Advance Directive: Out of facility DNR (pink MOST or yellow form), Pre-existing out of facility DNR order (yellow form or pink MOST form): Yellow form placed in chart (order not valid for inpatient use);Pink MOST form placed in chart (order not valid for inpatient use), Does patient want to make changes to medical advance directive?: No - Patient declined DNR Chief Complaint  Patient presents with  . Medical Management of Chronic Issues    1 month follow up    HPI:  83 yo female long term resident seen today for f/u. She was tx for left foot cellulitis last month and completed keflex tx. Wound has healed on foot per wound care nurse. Pt reports reappearance of left foot blister today. No pain. She has swelling in legs. She completed DVT tx last month. She also reports increasing back pain and states it is time for her to see pain mx for back pain. She gets epidural q65mos. She noticed that right medial leg scab "fell off" last week while scratching it and is c/a skin cancer. She was examined by dermatology about 2 weeks ago. No falls. Appetite is ok and sleeps well. Abdominal wound slowly healing. She  is followed by facility wound care provider.  Constipation - stable on MOM 30 cc every other day as needed; miralax to 17 gm daily; dulcolax prn  Insomnia - stable on restoril 30 mg nightly   Hx CHF - EF 55-60% (06-21-14). Stable on demadex 40 mg daily. She is O2 dependent   GERD - stable on prilosec 20 mg daily   Depression with anxiety - mood stable on cymbalta 60 mg daily; depakote 500 mg nightly   Glaucoma - stable on timolol OU twice daily   Allergic rhinitis - stable on flonase and mucinex daily; zyrtec/benadryl for itching prn; zaditor eye gtts prn  Pulmonary arterial hypertension - stable     Chronic pain syndrome - has chronic bilateral low back pain. On chronic narcotic therapy; takes methadone 5 mg NIGHTLY; robaxin 500 mg four times daily as needed; neurontin 300 mg twice daily; voltaren gel 1 gm twice daily to knees; oxycodone 5 or 10 mg every 8 hours prn.    Chronic surgical wound s/p laparotomy without removal of mesh - stable large abdominal wound. followed by facility wound doctor   Hx BLE DVT - of femoral veins. completed 6 mos xarelto tx on 11/14/16.   Past Medical History:  Diagnosis Date  . Anemia   . Arthritis   . CHF (congestive heart failure) ([place])   . Depression   . GERD (gastroesophageal reflux disease)   . Hyponatremia 01/2016  . Perforated chronic gastric ulcer ([place]) 08/11/2014  .  Pneumonia   . Pulmonary arterial hypertension ([place]) 11/28/2014    Past Surgical History:  Procedure Laterality Date  . ABDOMINAL HYSTERECTOMY    . ABDOMINAL SURGERY    . BACK SURGERY  2000  . COLON SURGERY     x2-blockage  . COLONOSCOPY    . ERCP    . HERNIA REPAIR  2012   x2 with mesh  . JOINT REPLACEMENT Left    shoulder  . LAPAROTOMY N/A 08/04/2014   Procedure: EXPLORATORY LAPAROTOMY, REPAIR OF PERFORATED ULCER;  Surgeon: Excell Seltzer, MD;  [facility];  Service: General;  Laterality: N/A;  . LAPAROTOMY N/A 12/21/2015   Procedure: EXPLORATORY LAPAROTOMY/  REMOVAL OF MESH;  Surgeon: Coralie Keens, MD;  Location: [facility];  Service: General;  Laterality: N/A;  . ORIF WRIST FRACTURE  2012   left  . REVERSE SHOULDER ARTHROPLASTY Right 12/04/2013  . REVERSE SHOULDER ARTHROPLASTY  12/04/2013   Procedure: REVERSE SHOULDER ARTHROPLASTY;  Surgeon: Nita Sells, MD;  Location: [facility] OR;  Service: Orthopedics;;  Right reverse total shoulder arthroplasty  . ROTATOR CUFF REPAIR     dr Tamera Punt  . SHOULDER ARTHROSCOPY  2012   lt and rt  . TENDON REPAIR Right 04/13/2014   Procedure: RIGHT HAND CENTRAL TENDON CENTRALIZATION OF LONG AND RING FINGERS;  Surgeon: Jolyn Nap, MD;  Location: [facility];  Service: Orthopedics;  Laterality: Right;    Patient Care Team: Gildardo Cranker, DO as PCP - General (Internal Medicine) Nyoka Cowden Phylis Bougie, NP as Nurse Practitioner ([place], [place] ([place])  Social History   Social History  . Marital status: Married    Spouse name: N/A  . Number of children: N/A  . Years of education: N/A   Occupational History  . Not on file.   Social History Main Topics  . Smoking status: Never Smoker  . Smokeless tobacco: Never Used  . Alcohol use 4.2 oz/week    7 Glasses of wine per week     Comment: 8 oz -12 oz of wine every   . Drug use: No  . Sexual activity: Not on file   Other Topics Concern  . Not on file   Social History Narrative  . No narrative on file     reports that she has never smoked. She has never used smokeless tobacco. She reports that she drinks about 4.2 oz of alcohol per week . She reports that she does not use drugs.  Family History  Problem Relation Age of Onset  . Stroke Mother   . Breast cancer Sister   . Ulcerative colitis Daughter   . Chronic fatigue Daughter   . Chronic fatigue Daughter    Family Status  Relation Status  . Mother Deceased  . Father Deceased at age 70       accident  . Sister Alive  .  Brother Alive  . Sister Alive  . Sister Alive  . Brother Alive  . Son Alive  . Daughter Alive  . Daughter Alive  . Son Alive    Immunization History  Administered Date(s) Administered  . Influenza Split 02/03/2015    Allergies  Allergen Reactions  . Tape Other (See Comments)    SKIN IS VERY THIN; IT BRUISES AND TEARS EASILY!! -THX    Medications: Patient's Medications  New Prescriptions   No medications on file  Previous Medications   ACETAMINOPHEN (TYLENOL) 325 MG TABLET    Take 325 mg by mouth  every 6 (six) hours as needed for mild pain.    AMINO ACIDS-PROTEIN HYDROLYS (FEEDING SUPPLEMENT, PRO-STAT SUGAR FREE 64,) LIQD    Take 30 mLs by mouth 3 (three) times daily with meals.   BISACODYL (DULCOLAX) 10 MG SUPPOSITORY    Place 1 suppository (10 mg total) rectally daily as needed for moderate constipation (May repeat times one).   CETIRIZINE (ZYRTEC) 10 MG TABLET    Take 10 mg by mouth daily.   DICLOFENAC SODIUM (VOLTAREN) 1 % GEL    Apply 1 g topically 2 (two) times daily. Apply to bilateral knees for pain   DIPHENHYDRAMINE HCL (BENADRYL PO)    Take 1 tablet every 24 hours as needed for itching   DIVALPROEX (DEPAKOTE SPRINKLE) 125 MG CAPSULE    Give 4 Capsules (500 mg) by mouth at bedtime   DULOXETINE (CYMBALTA) 60 MG CAPSULE    Take 60 mg by mouth at bedtime.    FLUOROMETHOLONE (FML) 0.1 % OPHTHALMIC SUSPENSION    Place 1 drop into both eyes 3 (three) times daily.   FLUTICASONE (FLONASE) 50 MCG/ACT NASAL SPRAY    Place 1 spray into both nostrils at bedtime.   GABAPENTIN (NEURONTIN) 300 MG CAPSULE    Take 300 mg by mouth 2 (two) times daily.   GUAIFENESIN (MUCINEX) 600 MG 12 HR TABLET    Take 600 mg by mouth daily.   KETOTIFEN FUMARATE (ZADITOR OP)    Instill 1 drop in both eyes two times daily for Allergy   LACTOBACILLUS (ACIDOPHILUS) CAPS CAPSULE    Take 1 capsule by mouth 2 (two) times daily.   MAGNESIUM HYDROXIDE (MILK OF MAGNESIA) 400 MG/5ML SUSPENSION    Take 60 mLs by  mouth every other day. AS NEEDED FOR CONSTIPATION   METHADONE (DOLOPHINE) 5 MG TABLET    Take 1 tablet (5 mg total) by mouth at bedtime.   METHOCARBAMOL (ROBAXIN) 500 MG TABLET    Take 500 mg by mouth every 6 (six) hours as needed.    MULTIPLE VITAMIN (MULTIVITAMIN) TABLET    Take 1 tablet by mouth daily.   OMEGA-3 FATTY ACIDS (FISH OIL) 1000 MG CPDR    Take 1,000 mg by mouth daily.    OMEPRAZOLE (PRILOSEC) 20 MG CAPSULE    Take 20 mg by mouth daily.   OXYCODONE (OXY IR/ROXICODONE) 5 MG IMMEDIATE RELEASE TABLET    Take 1-2 tablets (5-10 mg total) by mouth every 8 (eight) hours as needed. For pain   OXYGEN    Inhale into the lungs. 2-6 L/min to keep stat at 90 or above   POLYETHYLENE GLYCOL (MIRALAX / GLYCOLAX) PACKET    Take 17 g by mouth daily.    TEMAZEPAM (RESTORIL) 30 MG CAPSULE    Take 1 capsule (30 mg total) by mouth at bedtime as needed for sleep.   TIMOLOL (BETIMOL) 0.5 % OPHTHALMIC SOLUTION    Place 1 drop into both eyes 2 (two) times daily.    TORSEMIDE (DEMADEX) 20 MG TABLET    Take 2 tablets by mouth once daily   UNABLE TO FIND    House Supplement - Take 4 oz by mouth 2 times daily  Modified Medications   No medications on file  Discontinued Medications   RIVAROXABAN (XARELTO) 20 MG TABS TABLET    Take 20 mg by mouth at bedtime.    Review of Systems  Musculoskeletal: Positive for arthralgias and back pain.  Skin: Positive for rash and wound.  All other systems reviewed and  are negative.   Vitals:   11/27/16 0858  BP: 115/68  Pulse: 77  Temp: 98.1 F (36.7 C)  SpO2: 97%  Weight: 174 lb 11.2 oz (79.2 kg)  Height: 5\' 3"  (1.6 m)   Body mass index is 30.95 kg/m.  Physical Exam  Constitutional: She is oriented to person, place, and time. She appears well-developed and well-nourished.  Sitting up in bed in NAD  HENT:  Mouth/Throat: Oropharynx is clear and moist. No oropharyngeal exudate.  MMM; no oral thrush  Eyes: Pupils are equal, round, and reactive to light. No  scleral icterus.  Neck: Neck supple. Carotid bruit is not present. No tracheal deviation present.  Cardiovascular: Normal rate, regular rhythm and intact distal pulses.  Exam reveals no gallop and no friction rub.   Murmur (1/6 SEM) heard. L>RLE swelling with no calf TTP  Pulmonary/Chest: Effort normal and breath sounds normal. No stridor. No respiratory distress. She has no wheezes. She has no rales.  Abdominal: Soft. Normal appearance and bowel sounds are normal. She exhibits no distension and no mass. There is no hepatomegaly. There is no tenderness. There is no rigidity, no rebound and no guarding. No hernia.  Abdominal wound dsg c/d/i. Size is 7.7 x 10.8 cm  Musculoskeletal: She exhibits edema and tenderness.  Lymphadenopathy:    She has no cervical adenopathy.  Neurological: She is alert and oriented to person, place, and time.  Skin: Skin is warm and dry. Rash (left dorsal foot with x3 very tiny closed firm vesicles; erythema present but no d/c or TTP) noted.     Psychiatric: She has a normal mood and affect. Her behavior is normal. Judgment and thought content normal.     Labs reviewed: Abstract on 10/30/2016  Component Date Value Ref Range Status  . Glucose 10/28/2016 97   Final  . BUN 10/28/2016 15  4 - 21 Final  . Creatinine 10/28/2016 0.5  0.5 - 1.1 Final  . Potassium 10/28/2016 4.3  3.4 - 5.3 Final  . Sodium 10/28/2016 132* 137 - 147 Final  . Alkaline Phosphatase 10/28/2016 73  25 - 125 Final  . ALT 10/28/2016 6* 7 - 35 Final  . AST 10/28/2016 11* 13 - 35 Final  . Bilirubin, Total 10/28/2016 0.2   Final   <    No results found.   Assessment/Plan   ICD-10-CM   1. Blister (nonthermal), left foot, initial encounter - NEW S90.822A   2. Chronic bilateral low back pain without sciatica M54.5    G89.29   3. History of DVT of lower extremity Z86.718   4. Protein-calorie malnutrition, severe ([place]) E43   5. Chronic wound infection of abdomen, sequela S31.109S    L08.9    6. Bilateral leg edema R60.0   7. Chronic congestive heart failure, unspecified heart failure type (HCC) I50.9   8. Skin lesion of right leg L98.9    r/o skin cancer     Check BMP to assess K off KCL   Repeat venous doppler US b/l LE to r/o clot  Refer to pain mx (Dr Asa Lente) for epidural injection  Refer to derm for medial right leg lesion  Cont other meds as ordered  PT/OT as indicated  Cont wound care as ordered. Dr Mel Almond to follow left foot blister area. No abx Rx today  Will follow      Isador Castille S. Perlie Gold  [facility] [place],  [place] [address] [phone] Cell (Monday-Friday 8 AM - 5 PM) [phone] After 5 PM and follow prompts

## 2016-11-28 DIAGNOSIS — D649 Anemia, unspecified: Secondary | ICD-10-CM | POA: Diagnosis not present

## 2016-11-28 DIAGNOSIS — L98499 Non-pressure chronic ulcer of skin of other sites with unspecified severity: Secondary | ICD-10-CM | POA: Diagnosis not present

## 2016-11-28 DIAGNOSIS — Z7251 High risk heterosexual behavior: Secondary | ICD-10-CM | POA: Diagnosis not present

## 2016-11-28 LAB — BASIC METABOLIC PANEL
BUN: 11 (ref 4–21)
Creatinine: 0.7 (ref 0.5–1.1)
Glucose: 83
Potassium: 4.2 (ref 3.4–5.3)
SODIUM: 133 — AB (ref 137–147)

## 2016-11-29 DIAGNOSIS — L8992 Pressure ulcer of unspecified site, stage 2: Secondary | ICD-10-CM | POA: Diagnosis not present

## 2016-11-29 DIAGNOSIS — T8579XS Infection and inflammatory reaction due to other internal prosthetic devices, implants and grafts, sequela: Secondary | ICD-10-CM | POA: Diagnosis not present

## 2016-11-29 DIAGNOSIS — M549 Dorsalgia, unspecified: Secondary | ICD-10-CM | POA: Diagnosis not present

## 2016-12-05 DIAGNOSIS — L98499 Non-pressure chronic ulcer of skin of other sites with unspecified severity: Secondary | ICD-10-CM | POA: Diagnosis not present

## 2016-12-12 DIAGNOSIS — L98499 Non-pressure chronic ulcer of skin of other sites with unspecified severity: Secondary | ICD-10-CM | POA: Diagnosis not present

## 2016-12-18 ENCOUNTER — Other Ambulatory Visit: Payer: Self-pay

## 2016-12-18 MED ORDER — METHADONE HCL 5 MG PO TABS
5.0000 mg | ORAL_TABLET | Freq: Every day | ORAL | 0 refills | Status: DC
Start: 2016-12-18 — End: 2017-01-16

## 2016-12-18 NOTE — Telephone Encounter (Signed)
RX faxed to [facility] @ [phone], phone number [phone] 

## 2016-12-19 ENCOUNTER — Other Ambulatory Visit: Payer: Self-pay

## 2016-12-19 DIAGNOSIS — L98492 Non-pressure chronic ulcer of skin of other sites with fat layer exposed: Secondary | ICD-10-CM | POA: Diagnosis not present

## 2016-12-19 MED ORDER — OXYCODONE HCL 5 MG PO TABS: 5.0000 mg | ORAL_TABLET | Freq: Three times a day (TID) | ORAL | 0 refills | Status: DC | PRN

## 2016-12-19 NOTE — Telephone Encounter (Signed)
RX faxed to [facility] @ [phone], phone number [phone] 

## 2016-12-20 ENCOUNTER — Encounter: Payer: Self-pay | Admitting: Adult Health

## 2016-12-20 ENCOUNTER — Non-Acute Institutional Stay (SKILLED_NURSING_FACILITY): Payer: PPO | Admitting: Adult Health

## 2016-12-20 DIAGNOSIS — M545 Low back pain: Secondary | ICD-10-CM

## 2016-12-20 DIAGNOSIS — I509 Heart failure, unspecified: Secondary | ICD-10-CM | POA: Diagnosis not present

## 2016-12-20 DIAGNOSIS — L03213 Periorbital cellulitis: Secondary | ICD-10-CM | POA: Diagnosis not present

## 2016-12-20 DIAGNOSIS — G8929 Other chronic pain: Secondary | ICD-10-CM | POA: Diagnosis not present

## 2016-12-20 NOTE — Progress Notes (Signed)
Location:   [facility] Room Number: 105 A Place of Service:  SNF (31)   CODE STATUS: DNR  Allergies  Allergen Reactions  . Tape Other (See Comments)    SKIN IS VERY THIN; IT BRUISES AND TEARS EASILY!! -THX    Chief Complaint  Patient presents with  . Acute Visit    Care Plan Meeting    HPI:  We have come together with care plan team; family and resident for her routine care plan meeting. She and her family have multiple nursing and medical concerns. The nursing concerns have been addressed. She has periorbital inflammation present bilaterally. Her family states that she had a cream for her dermatologist but cannot remember what it was. She is due for her spinal injection; will need to be seen by the wound clinic. She would like to see orthopedics for injections in her knees. She is placing her glaucoma eye drops herself and is apparently placing the tip on her eye. She has been instructed to let the staff apply her eye drops to avoid contaminating the eye drop. She has declined this plan.  Her MOST form has been reviewed.     Past Medical History:  Diagnosis Date  . Anemia   . Arthritis   . CHF (congestive heart failure) ([place])   . Depression   . GERD (gastroesophageal reflux disease)   . Hyponatremia 01/2016  . Perforated chronic gastric ulcer ([place]) 08/11/2014  . Pneumonia   . Pulmonary arterial hypertension ([place]) 11/28/2014    Past Surgical History:  Procedure Laterality Date  . ABDOMINAL HYSTERECTOMY    . ABDOMINAL SURGERY    . BACK SURGERY  2000  . COLON SURGERY     x2-blockage  . COLONOSCOPY    . ERCP    . HERNIA REPAIR  2012   x2 with mesh  . JOINT REPLACEMENT Left    shoulder  . LAPAROTOMY N/A 08/04/2014   Procedure: EXPLORATORY LAPAROTOMY, REPAIR OF PERFORATED ULCER;  Surgeon: Excell Seltzer, MD;  [facility];  Service: General;  Laterality: N/A;  . LAPAROTOMY N/A 12/21/2015   Procedure: EXPLORATORY LAPAROTOMY/ REMOVAL OF MESH;  Surgeon:  Coralie Keens, MD;  Location: [facility];  Service: General;  Laterality: N/A;  . ORIF WRIST FRACTURE  2012   left  . REVERSE SHOULDER ARTHROPLASTY Right 12/04/2013  . REVERSE SHOULDER ARTHROPLASTY  12/04/2013   Procedure: REVERSE SHOULDER ARTHROPLASTY;  Surgeon: Nita Sells, MD;  Location: [facility] OR;  Service: Orthopedics;;  Right reverse total shoulder arthroplasty  . ROTATOR CUFF REPAIR     dr Tamera Punt  . SHOULDER ARTHROSCOPY  2012   lt and rt  . TENDON REPAIR Right 04/13/2014   Procedure: RIGHT HAND CENTRAL TENDON CENTRALIZATION OF LONG AND RING FINGERS;  Surgeon: Jolyn Nap, MD;  Location: [facility];  Service: Orthopedics;  Laterality: Right;    Social History   Social History  . Marital status: Married    Spouse name: N/A  . Number of children: N/A  . Years of education: N/A   Occupational History  . Not on file.   Social History Main Topics  . Smoking status: Never Smoker  . Smokeless tobacco: Never Used  . Alcohol use 4.2 oz/week    7 Glasses of wine per week     Comment: 8 oz -12 oz of wine every   . Drug use: No  . Sexual activity: Not on file   Other Topics Concern  . Not on  file   Social History Narrative  . No narrative on file   Family History  Problem Relation Age of Onset  . Stroke Mother   . Breast cancer Sister   . Ulcerative colitis Daughter   . Chronic fatigue Daughter   . Chronic fatigue Daughter       VITAL SIGNS BP 139/78   Pulse 80   Temp 98.2 F (36.8 C)   Resp 18   Ht '5\' 3"'  (1.6 m)   Wt 174 lb 11.2 oz (79.2 kg)   SpO2 98%   BMI 30.95 kg/m   Patient's Medications  New Prescriptions   No medications on file  Previous Medications   ACETAMINOPHEN (TYLENOL) 325 MG TABLET    Take 325 mg by mouth every 6 (six) hours as needed for mild pain.    AMINO ACIDS-PROTEIN HYDROLYS (FEEDING SUPPLEMENT, PRO-STAT SUGAR FREE 64,) LIQD    Take 30 mLs by mouth 3 (three) times daily with meals.   BISACODYL (DULCOLAX) 10  MG SUPPOSITORY    Place 1 suppository (10 mg total) rectally daily as needed for moderate constipation (May repeat times one).   CETIRIZINE (ZYRTEC) 10 MG TABLET    Take 10 mg by mouth daily.   DICLOFENAC SODIUM (VOLTAREN) 1 % GEL    Apply 1 g topically 2 (two) times daily. Apply to bilateral knees for pain   DIPHENHYDRAMINE HCL (BENADRYL PO)    Take 1 tablet every 24 hours as needed for itching   DIVALPROEX (DEPAKOTE SPRINKLE) 125 MG CAPSULE    Give 4 Capsules (500 mg) by mouth at bedtime   DULOXETINE (CYMBALTA) 60 MG CAPSULE    Take 60 mg by mouth at bedtime.    FLUOROMETHOLONE (FML) 0.1 % OPHTHALMIC SUSPENSION    Place 1 drop into both eyes 3 (three) times daily.   FLUTICASONE (FLONASE) 50 MCG/ACT NASAL SPRAY    Place 1 spray into both nostrils at bedtime.   GABAPENTIN (NEURONTIN) 300 MG CAPSULE    Take 300 mg by mouth 2 (two) times daily.   GUAIFENESIN (MUCINEX) 600 MG 12 HR TABLET    Take 600 mg by mouth daily.   KETOTIFEN FUMARATE (ZADITOR OP)    Instill 1 drop in both eyes two times daily for Allergy   MAGNESIUM HYDROXIDE (MILK OF MAGNESIA) 400 MG/5ML SUSPENSION    Take 60 mLs by mouth every other day. AS NEEDED FOR CONSTIPATION   METHADONE (DOLOPHINE) 5 MG TABLET    Take 1 tablet (5 mg total) by mouth at bedtime.   METHOCARBAMOL (ROBAXIN) 500 MG TABLET    Take 500 mg by mouth every 6 (six) hours as needed.    MULTIPLE VITAMIN (MULTIVITAMIN) TABLET    Take 1 tablet by mouth daily.   OMEGA-3 FATTY ACIDS (FISH OIL) 1000 MG CPDR    Take 1,000 mg by mouth daily.    OMEPRAZOLE (PRILOSEC) 20 MG CAPSULE    Take 20 mg by mouth daily.   OXYCODONE (OXY IR/ROXICODONE) 5 MG IMMEDIATE RELEASE TABLET    Take 1-2 tablets (5-10 mg total) by mouth every 8 (eight) hours as needed. For pain   OXYGEN    Inhale into the lungs. 2-6 L/min to keep stat at 90 or above   POLYETHYLENE GLYCOL (MIRALAX / GLYCOLAX) PACKET    Take 17 g by mouth daily.    RIVAROXABAN (XARELTO) 15 MG TABS TABLET    Take 15 mg by mouth 2  (two) times daily with a meal.  TEMAZEPAM (RESTORIL) 30 MG CAPSULE    Take 1 capsule (30 mg total) by mouth at bedtime as needed for sleep.   TIMOLOL (BETIMOL) 0.5 % OPHTHALMIC SOLUTION    Place 1 drop into both eyes 2 (two) times daily.    TORSEMIDE (DEMADEX) 20 MG TABLET    Take 2 tablets by mouth once daily   UNABLE TO FIND    House Supplement - Take 4 oz by mouth 2 times daily  Modified Medications   No medications on file  Discontinued Medications   No medications on file     SIGNIFICANT DIAGNOSTIC EXAMS  PREVIOUS  01-20-16; chest x-ray: 1. Cardiomegaly and mild interstitial edema. 2. Improved aeration.  01-21-16: ct of abdomen and pelvis: 1. Apparent wall thickening at the rectum could reflect mild proctitis. 2. Small bilateral pleural effusions noted. Bibasilar airspace opacities may reflect atelectasis or possibly infection. 3. Large right-sided hiatal hernia, containing most of the stomach. 4. Mild soft tissue inflammation suggested about the bladder. Would correlate for any evidence of cystitis. 5. Nonspecific soft tissue inflammation suggested about the transverse colon. 6. Mild inflammation about the gallbladder is nonspecific, given the underlying diffuse soft tissue inflammation about the abdomen. 7. Very large anterior abdominal wall defect again noted, with packing material and underlying soft tissue thickening. 8. Scattered diverticulosis along the distal sigmoid colon, without evidence of diverticulitis. 9. Scattered aortic atherosclerosis noted. 10. Right convex thoracolumbar scoliosis noted.   01-21-16 ct of neck: 1. Findings consistent with right parotiditis. No abscess identified. No significant lymphadenopathy. 2. Partially visualized small right pleural effusion and dependent right upper lobe opacity which may represent associated pneumonia or atelectasis.  07-03-16: right hip x-ray:No evidence of fracture or dislocation.   07-03-16: lumbar spine x-ray: No  evidence of fracture or subluxation along the lumbar spine. Right convex thoracolumbar scoliosis again noted, with underlying degenerative change.  NO NEW EXAMS   LABS REVIEWED:PREVIOUS    12-27-15: pre-albumin 6.9 01-20-16; wbc 13.8; hgb 10.2; hct 32.6; mcv 95.0; plt 447; gluocose 109; bun 14; creat 0.62; k+ 4.7; na++ 131; alk phos 130; albumin 1.9 01-23-16: wbc 15.1; hgb 8.7; hct 27.2; mcv 93.6; plt 381; glucose 92; bun 8; creat 0.62; k+ 3.8; na++ 134 02-15-16: wbc 9.5; hgb 9.9; hct 31; plt 379; glucose 110; bun 9; creat 0.6; k+ 4.3; na++ 135  05-30-16: glucose 112; bun 12; creat 0.6; k+ 4.3 ;na++ 145 08-07-16: wbc 11.0; hgb 11.1; hct 34; plt 226    NO NEW LABS    Review of Systems  Constitutional: Negative for malaise/fatigue.  Eyes: Positive for discharge.  Respiratory: Negative for cough and shortness of breath.   Cardiovascular: Negative for chest pain, palpitations and leg swelling.  Gastrointestinal: Negative for abdominal pain, constipation and heartburn.  Musculoskeletal: Positive for back pain and joint pain. Negative for myalgias.       Bilateral knee pain   Skin: Negative.   Neurological: Negative for dizziness.  Psychiatric/Behavioral: The patient is not nervous/anxious.      Physical Exam  Constitutional: No distress.  Eyes:  Has yellow drainage from eyes  Has periorbital redness present.   Neck: Neck supple. No thyromegaly present.  Cardiovascular: Normal rate, regular rhythm and intact distal pulses.   Murmur heard. Pulmonary/Chest: Effort normal and breath sounds normal. No respiratory distress.  Abdominal: Soft. Bowel sounds are normal. She exhibits no distension. There is no tenderness.  Musculoskeletal: She exhibits no edema.  Is able to move upper extremities    Lymphadenopathy:  She has no cervical adenopathy.  Neurological: She is alert.  Skin: Skin is warm and dry. She is not diaphoretic.  Abdominal wound: 7.8 x 10.8 cm   Psychiatric: She has a  normal mood and affect.    ASSESSMENT/PLAN  TODAY:   1. CHF:EF 55-60% (06-21-14)  2. Chronic pain syndrome: 3. Peri-orbital cellulitis   Will have her see the pain clinic: Dr. Asa Lente and orthopedics : Dr. Berenice Primas Will begin keflex 500 mg twice daily for 10 days with probiotic for her peri-orbital cellulitis   More than 45 minutes spent with patient and family discussing her medical needs and and concerns her nursing concerns; discussed medical treatment; family and patient have verbalized understanding.    MD is aware of resident's narcotic use and is in agreement with current plan of care. We will attempt to wean resident as apropriate     Ok Edwards NP [facility]  Contact [phone] Monday through Friday 8am- 5pm  After hours call [phone]

## 2016-12-21 ENCOUNTER — Other Ambulatory Visit: Payer: Self-pay

## 2016-12-21 MED ORDER — TEMAZEPAM 30 MG PO CAPS: 30.0000 mg | ORAL_CAPSULE | Freq: Every evening | ORAL | 0 refills | Status: DC | PRN

## 2016-12-21 NOTE — Telephone Encounter (Signed)
RX faxed to [facility] @ [phone], phone number [phone] 

## 2016-12-26 DIAGNOSIS — L98499 Non-pressure chronic ulcer of skin of other sites with unspecified severity: Secondary | ICD-10-CM | POA: Diagnosis not present

## 2016-12-28 ENCOUNTER — Non-Acute Institutional Stay (SKILLED_NURSING_FACILITY): Payer: PPO | Admitting: Adult Health

## 2016-12-28 ENCOUNTER — Encounter: Payer: Self-pay | Admitting: Adult Health

## 2016-12-28 DIAGNOSIS — J302 Other seasonal allergic rhinitis: Secondary | ICD-10-CM

## 2016-12-28 DIAGNOSIS — H409 Unspecified glaucoma: Secondary | ICD-10-CM | POA: Diagnosis not present

## 2016-12-28 DIAGNOSIS — I2721 Secondary pulmonary arterial hypertension: Secondary | ICD-10-CM

## 2016-12-28 DIAGNOSIS — G894 Chronic pain syndrome: Secondary | ICD-10-CM | POA: Diagnosis not present

## 2016-12-28 NOTE — Progress Notes (Signed)
Location:   [facility] Room Number: 105 A Place of Service:  SNF (31)   CODE STATUS: DNR  Allergies  Allergen Reactions  . Tape Other (See Comments)    SKIN IS VERY THIN; IT BRUISES AND TEARS EASILY!! -THX    Chief Complaint  Patient presents with  . Medical Management of Chronic Issues    Glaucoma; allergic rhinitis; pulmonary hypertension; chronic pain syndrome     HPI:  She is a 83 year old long term resident of this facility being seen for the management of her chronic illnesses: pulmonary arterial hypertension; chronic pain syndrome; glaucoma; seasonal allergic rhinitis. She is not complaining of worsening back or knee pain; her allergies are being managed. She denies chest pain. There are no nursing concerns at this time   Past Medical History:  Diagnosis Date  . Anemia   . Arthritis   . CHF (congestive heart failure) ([place])   . Depression   . GERD (gastroesophageal reflux disease)   . Hyponatremia 01/2016  . Perforated chronic gastric ulcer ([place]) 08/11/2014  . Pneumonia   . Pulmonary arterial hypertension ([place]) 11/28/2014    Past Surgical History:  Procedure Laterality Date  . ABDOMINAL HYSTERECTOMY    . ABDOMINAL SURGERY    . BACK SURGERY  2000  . COLON SURGERY     x2-blockage  . COLONOSCOPY    . ERCP    . HERNIA REPAIR  2012   x2 with mesh  . JOINT REPLACEMENT Left    shoulder  . LAPAROTOMY N/A 08/04/2014   Procedure: EXPLORATORY LAPAROTOMY, REPAIR OF PERFORATED ULCER;  Surgeon: Excell Seltzer, MD;  Location: [facility];  Service: General;  Laterality: N/A;  . LAPAROTOMY N/A 12/21/2015   Procedure: EXPLORATORY LAPAROTOMY/ REMOVAL OF MESH;  Surgeon: Coralie Keens, MD;  Location: [facility];  Service: General;  Laterality: N/A;  . ORIF WRIST FRACTURE  2012   left  . REVERSE SHOULDER ARTHROPLASTY Right 12/04/2013  . REVERSE SHOULDER ARTHROPLASTY  12/04/2013   Procedure: REVERSE SHOULDER ARTHROPLASTY;  Surgeon: Nita Sells, MD;  Location:  [facility] OR;  Service: Orthopedics;;  Right reverse total shoulder arthroplasty  . ROTATOR CUFF REPAIR     dr Tamera Punt  . SHOULDER ARTHROSCOPY  2012   lt and rt  . TENDON REPAIR Right 04/13/2014   Procedure: RIGHT HAND CENTRAL TENDON CENTRALIZATION OF LONG AND RING FINGERS;  Surgeon: Jolyn Nap, MD;  Location: [facility];  Service: Orthopedics;  Laterality: Right;    Social History   Social History  . Marital status: Married    Spouse name: N/A  . Number of children: N/A  . Years of education: N/A   Occupational History  . Not on file.   Social History Main Topics  . Smoking status: Never Smoker  . Smokeless tobacco: Never Used  . Alcohol use 4.2 oz/week    7 Glasses of wine per week     Comment: 8 oz -12 oz of wine every   . Drug use: No  . Sexual activity: Not on file   Other Topics Concern  . Not on file   Social History Narrative  . No narrative on file   Family History  Problem Relation Age of Onset  . Stroke Mother   . Breast cancer Sister   . Ulcerative colitis Daughter   . Chronic fatigue Daughter   . Chronic fatigue Daughter       VITAL SIGNS BP 106/76   Pulse 70  Temp 97.8 F (36.6 C)   Resp 16   Ht 5\' 3"  (1.6 m)   Wt 177 lb 6.4 oz (80.5 kg)   SpO2 97%   BMI 31.42 kg/m   Patient's Medications  New Prescriptions   No medications on file  Previous Medications   ACETAMINOPHEN (TYLENOL) 325 MG TABLET    Take 325 mg by mouth every 6 (six) hours as needed for mild pain.    AMINO ACIDS-PROTEIN HYDROLYS (FEEDING SUPPLEMENT, PRO-STAT SUGAR FREE 64,) LIQD    Take 30 mLs by mouth 3 (three) times daily with meals.   BISACODYL (DULCOLAX) 10 MG SUPPOSITORY    Place 1 suppository (10 mg total) rectally daily as needed for moderate constipation (May repeat times one).   CEPHALEXIN (KEFLEX) 250 MG CAPSULE    Take 500 mg by mouth 2 (two) times daily.   CETIRIZINE (ZYRTEC) 10 MG TABLET    Take 10 mg by mouth daily.   DICLOFENAC SODIUM  (VOLTAREN) 1 % GEL    Apply 1 g topically 2 (two) times daily. Apply to bilateral knees for pain   DIPHENHYDRAMINE HCL (BENADRYL PO)    Take 1 tablet every 24 hours as needed for itching   DIVALPROEX (DEPAKOTE SPRINKLE) 125 MG CAPSULE    Give 4 Capsules (500 mg) by mouth at bedtime   DULOXETINE (CYMBALTA) 60 MG CAPSULE    Take 60 mg by mouth at bedtime.    FLUOROMETHOLONE (FML) 0.1 % OPHTHALMIC SUSPENSION    Place 1 drop into both eyes 3 (three) times daily.   FLUTICASONE (FLONASE) 50 MCG/ACT NASAL SPRAY    Place 1 spray into both nostrils at bedtime.   GABAPENTIN (NEURONTIN) 300 MG CAPSULE    Take 300 mg by mouth 2 (two) times daily.   GUAIFENESIN (MUCINEX) 600 MG 12 HR TABLET    Take 600 mg by mouth daily.   KETOTIFEN FUMARATE (ZADITOR OP)    Instill 1 drop in both eyes two times daily for Allergy   LACTOBACILLUS PO    Take 1 capsule by mouth 2 (two) times daily.   MAGNESIUM HYDROXIDE (MILK OF MAGNESIA) 400 MG/5ML SUSPENSION    Take 60 mLs by mouth every other day. AS NEEDED FOR CONSTIPATION   METHADONE (DOLOPHINE) 5 MG TABLET    Take 1 tablet (5 mg total) by mouth at bedtime.   METHOCARBAMOL (ROBAXIN) 500 MG TABLET    Take 500 mg by mouth every 6 (six) hours as needed.    MULTIPLE VITAMIN (MULTIVITAMIN) TABLET    Take 1 tablet by mouth daily.   OMEGA-3 FATTY ACIDS (FISH OIL) 1000 MG CPDR    Take 1,000 mg by mouth daily.    OMEPRAZOLE (PRILOSEC) 20 MG CAPSULE    Take 20 mg by mouth daily.   OXYCODONE (OXY IR/ROXICODONE) 5 MG IMMEDIATE RELEASE TABLET    Take 1-2 tablets (5-10 mg total) by mouth every 8 (eight) hours as needed. For pain   OXYGEN    Inhale into the lungs. 2-6 L/min to keep stat at 90 or above   POLYETHYLENE GLYCOL (MIRALAX / GLYCOLAX) PACKET    Take 17 g by mouth daily.    RIVAROXABAN (XARELTO) 20 MG TABS TABLET    Take 20 mg by mouth daily.    TEMAZEPAM (RESTORIL) 30 MG CAPSULE    Take 1 capsule (30 mg total) by mouth at bedtime as needed for sleep.   TIMOLOL (BETIMOL) 0.5 %  OPHTHALMIC SOLUTION    Place 1 drop  into both eyes 2 (two) times daily.    TOBRAMYCIN (TOBREX) 0.3 % OPHTHALMIC SOLUTION    Place 2 drops into both eyes 4 (four) times daily.   TORSEMIDE (DEMADEX) 20 MG TABLET    Take 2 tablets by mouth once daily   UNABLE TO FIND    House Supplement - Take 4 oz by mouth 2 times daily  Modified Medications   No medications on file  Discontinued Medications   No medications on file     SIGNIFICANT DIAGNOSTIC EXAMS  PREVIOUS  01-20-16; chest x-ray: 1. Cardiomegaly and mild interstitial edema. 2. Improved aeration.  01-21-16: ct of abdomen and pelvis: 1. Apparent wall thickening at the rectum could reflect mild proctitis. 2. Small bilateral pleural effusions noted. Bibasilar airspace opacities may reflect atelectasis or possibly infection. 3. Large right-sided hiatal hernia, containing most of the stomach. 4. Mild soft tissue inflammation suggested about the bladder. Would correlate for any evidence of cystitis. 5. Nonspecific soft tissue inflammation suggested about the transverse colon. 6. Mild inflammation about the gallbladder is nonspecific, given the underlying diffuse soft tissue inflammation about the abdomen. 7. Very large anterior abdominal wall defect again noted, with packing material and underlying soft tissue thickening. 8. Scattered diverticulosis along the distal sigmoid colon, without evidence of diverticulitis. 9. Scattered aortic atherosclerosis noted. 10. Right convex thoracolumbar scoliosis noted.   01-21-16 ct of neck: 1. Findings consistent with right parotiditis. No abscess identified. No significant lymphadenopathy. 2. Partially visualized small right pleural effusion and dependent right upper lobe opacity which may represent associated pneumonia or atelectasis.  07-03-16: right hip x-ray:No evidence of fracture or dislocation.   07-03-16: lumbar spine x-ray: No evidence of fracture or subluxation along the lumbar spine. Right  convex thoracolumbar scoliosis again noted, with underlying degenerative change.  NO NEW EXAMS   LABS REVIEWED:PREVIOUS    12-27-15: pre-albumin 6.9 01-20-16; wbc 13.8; hgb 10.2; hct 32.6; mcv 95.0; plt 447; gluocose 109; bun 14; creat 0.62; k+ 4.7; na++ 131; alk phos 130; albumin 1.9 01-23-16: wbc 15.1; hgb 8.7; hct 27.2; mcv 93.6; plt 381; glucose 92; bun 8; creat 0.62; k+ 3.8; na++ 134 02-15-16: wbc 9.5; hgb 9.9; hct 31; plt 379; glucose 110; bun 9; creat 0.6; k+ 4.3; na++ 135  05-30-16: glucose 112; bun 12; creat 0.6; k+ 4.3 ;na++ 145 08-07-16: wbc 11.0; hgb 11.1; hct 34; plt 226    TODAY:   10-28-16: glucose 97 bun 15.2; creat 0.46; k+ 4.3; na++ 132; ca 9.3; liver normal albumin 3.4  11-28-16:glucose 83; bun 11.0; creat 0.65; k+ 4.2; na++ 133; ca 9.7    Review of Systems  Constitutional: Negative for malaise/fatigue.  Respiratory: Negative for cough and shortness of breath.   Cardiovascular: Negative for chest pain, palpitations and leg swelling.  Gastrointestinal: Negative for abdominal pain, constipation and heartburn.  Musculoskeletal: Positive for back pain and joint pain. Negative for myalgias.       Has chronic back and knee pain; is managed at this time   Skin: Negative.   Neurological: Negative for dizziness.  Psychiatric/Behavioral: The patient is not nervous/anxious.    Physical Exam  Constitutional: She is oriented to person, place, and time. No distress.  Eyes: Conjunctivae are normal.  Neck: Neck supple. No thyromegaly present.  Cardiovascular: Normal rate, regular rhythm and intact distal pulses.   Murmur heard. Pulmonary/Chest: Effort normal and breath sounds normal. No respiratory distress.  Abdominal: Soft. Bowel sounds are normal. She exhibits no distension.  Musculoskeletal: She exhibits no edema.  Able to move upper extremities   Lymphadenopathy:    She has no cervical adenopathy.  Neurological: She is alert and oriented to person, place, and time.    Skin: Skin is warm. She is diaphoretic.  Abdominal wound: 7.8 x 10.8 cm    Psychiatric: She has a normal mood and affect.     ASSESSMENT/ PLAN:   TODAY:   1. Glaucoma: is without change  will continue timolol to both eyes twice daily   2. Allergic rhinits: is stable  will continue flonase and mucinex daily   3. Pulmonary arterial hypertension: stable  b/p 106/76 no changes will monitor   4. Chronic pain syndrome: has chronic bilateral low back pain:  Is stable  is on chronic narcotic therapy: will continue methadone 5 mg NIGHTLY robaxin 500 mg four  times daily as needed; neurontin 300 mg twice daily   voltaren gel 1 gm twice daily to knees oxycodone 5 or 10 mg every 8 hours as needed    Has  benadryl 12.5 mg daily  as needed for itching.    PREVIOUS:    5. Constipation: stable  will continue MOM 30 cc every other day as needed;  miralax  17 gm daily will monitor   6.  Chronic surgical wound due to Status post laparotomy without  removal of mesh: is stable  has large abdominal wound: will continue current treatment is followed by wound doctor   7. Insomnia: is stable will continue r restoril  30 mg nightly   8. Bilateral YDX:AJOINO  femoral veins:is stable  will continue xarelto 20 mg daily   9. CHF:EF 55-60% (06-21-14) is stable will continue demadex 20 mg daily with k+ 20 meq daily is  02 dependent   10. Gerd: stable  will continue prilosec 20 mg daily   11. Depression with anxiety:is stable  will continue cymbalta 60 mg daily takes depakote 500 mg nightly to stabilize mood.   MD is aware of resident's narcotic use and is in agreement with current plan of care. We will attempt to wean resident as apropriate   Ok Edwards NP [facility]  Contact [phone] Monday through Friday 8am- 5pm  After hours call [phone]

## 2017-01-01 DIAGNOSIS — L8992 Pressure ulcer of unspecified site, stage 2: Secondary | ICD-10-CM | POA: Diagnosis not present

## 2017-01-01 DIAGNOSIS — R21 Rash and other nonspecific skin eruption: Secondary | ICD-10-CM | POA: Diagnosis not present

## 2017-01-01 DIAGNOSIS — H01009 Unspecified blepharitis unspecified eye, unspecified eyelid: Secondary | ICD-10-CM | POA: Diagnosis not present

## 2017-01-01 DIAGNOSIS — L570 Actinic keratosis: Secondary | ICD-10-CM | POA: Diagnosis not present

## 2017-01-01 DIAGNOSIS — L821 Other seborrheic keratosis: Secondary | ICD-10-CM | POA: Diagnosis not present

## 2017-01-02 DIAGNOSIS — L98493 Non-pressure chronic ulcer of skin of other sites with necrosis of muscle: Secondary | ICD-10-CM | POA: Diagnosis not present

## 2017-01-09 DIAGNOSIS — L98499 Non-pressure chronic ulcer of skin of other sites with unspecified severity: Secondary | ICD-10-CM | POA: Diagnosis not present

## 2017-01-10 DIAGNOSIS — T8579XS Infection and inflammatory reaction due to other internal prosthetic devices, implants and grafts, sequela: Secondary | ICD-10-CM | POA: Diagnosis not present

## 2017-01-11 DIAGNOSIS — L8992 Pressure ulcer of unspecified site, stage 2: Secondary | ICD-10-CM | POA: Diagnosis not present

## 2017-01-11 DIAGNOSIS — M549 Dorsalgia, unspecified: Secondary | ICD-10-CM | POA: Diagnosis not present

## 2017-01-11 DIAGNOSIS — M533 Sacrococcygeal disorders, not elsewhere classified: Secondary | ICD-10-CM | POA: Diagnosis not present

## 2017-01-15 DIAGNOSIS — H409 Unspecified glaucoma: Secondary | ICD-10-CM | POA: Insufficient documentation

## 2017-01-15 DIAGNOSIS — J302 Other seasonal allergic rhinitis: Secondary | ICD-10-CM | POA: Insufficient documentation

## 2017-01-16 ENCOUNTER — Other Ambulatory Visit: Payer: Self-pay

## 2017-01-16 DIAGNOSIS — L98499 Non-pressure chronic ulcer of skin of other sites with unspecified severity: Secondary | ICD-10-CM | POA: Diagnosis not present

## 2017-01-16 MED ORDER — METHADONE HCL 5 MG PO TABS: 5.0000 mg | ORAL_TABLET | Freq: Every day | ORAL | 0 refills | Status: DC

## 2017-01-16 NOTE — Telephone Encounter (Signed)
RX faxed to [facility] @ [phone], phone number [phone] 

## 2017-01-23 ENCOUNTER — Other Ambulatory Visit: Payer: Self-pay

## 2017-01-23 MED ORDER — TEMAZEPAM 30 MG PO CAPS: 30.0000 mg | ORAL_CAPSULE | Freq: Every evening | ORAL | 0 refills | Status: DC | PRN

## 2017-01-23 NOTE — Telephone Encounter (Signed)
RX faxed to [facility] @ [phone], phone number [phone] 

## 2017-01-25 ENCOUNTER — Non-Acute Institutional Stay (SKILLED_NURSING_FACILITY): Payer: PPO | Admitting: Adult Health

## 2017-01-25 ENCOUNTER — Encounter: Payer: Self-pay | Admitting: Adult Health

## 2017-01-25 DIAGNOSIS — I82413 Acute embolism and thrombosis of femoral vein, bilateral: Secondary | ICD-10-CM

## 2017-01-25 DIAGNOSIS — G4700 Insomnia, unspecified: Secondary | ICD-10-CM | POA: Diagnosis not present

## 2017-01-25 DIAGNOSIS — K5903 Drug induced constipation: Secondary | ICD-10-CM | POA: Diagnosis not present

## 2017-01-25 DIAGNOSIS — T402X5A Adverse effect of other opioids, initial encounter: Secondary | ICD-10-CM | POA: Diagnosis not present

## 2017-01-25 DIAGNOSIS — T8189XD Other complications of procedures, not elsewhere classified, subsequent encounter: Secondary | ICD-10-CM

## 2017-01-25 NOTE — Progress Notes (Signed)
Location:   [facility] Room Number: 105 A Place of Service:  SNF (31)   CODE STATUS: DNR  Allergies  Allergen Reactions  . Tape Other (See Comments)    SKIN IS VERY THIN; IT BRUISES AND TEARS EASILY!! -THX    Chief Complaint  Patient presents with  . 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 She denies any worsening constipation; her surgical wound is without signs of infection; she is denying any leg pain at this time. There are no nursing concerns at this time.   Past Medical History:  Diagnosis Date  . Anemia   . Arthritis   . CHF (congestive heart failure) ([place])   . Depression   . GERD (gastroesophageal reflux disease)   . Hyponatremia 01/2016  . Perforated chronic gastric ulcer ([place]) 08/11/2014  . Pneumonia   . Pulmonary arterial hypertension ([place]) 11/28/2014    Past Surgical History:  Procedure Laterality Date  . ABDOMINAL HYSTERECTOMY    . ABDOMINAL SURGERY    . BACK SURGERY  2000  . COLON SURGERY     x2-blockage  . COLONOSCOPY    . ERCP    . HERNIA REPAIR  2012   x2 with mesh  . JOINT REPLACEMENT Left    shoulder  . ORIF WRIST FRACTURE  2012   left  . REVERSE SHOULDER ARTHROPLASTY Right 12/04/2013  . ROTATOR CUFF REPAIR     dr Tamera Punt  . SHOULDER ARTHROSCOPY  2012   lt and rt    Social History   Socioeconomic History  . Marital status: Married    Spouse name: Not on file  . Number of children: Not on file  . Years of education: Not on file  . Highest education level: Not on file  Social Needs  . Financial resource strain: Not on file  . Food insecurity - worry: Not on file  . Food insecurity - inability: Not on file  . Transportation needs -  medical: Not on file  . Transportation needs - non-medical: Not on file  Occupational History  . Not on file  Tobacco Use  . Smoking status: Never Smoker  . Smokeless tobacco: Never Used  Substance and Sexual Activity  . Alcohol use: Yes    Alcohol/week: 4.2 oz    Types: 7 Glasses of wine per week    Comment: 8 oz -12 oz of wine every   . Drug use: No  . Sexual activity: Not on file  Other Topics Concern  . Not on file  Social History Narrative  . Not on file   Family History  Problem Relation Age of Onset  . Stroke Mother   . Breast cancer Sister   . Ulcerative colitis Daughter   . Chronic fatigue Daughter   . Chronic fatigue Daughter       VITAL SIGNS BP 138/74   Pulse 62   Temp 98 F (36.7 C)   Resp 18   Ht '5\' 3"'  (1.6 m)   Wt 177 lb 6.4 oz (80.5 kg)   SpO2 95%   BMI 31.42 kg/m     Medication List        Accurate as of 01/25/17 10:32 AM. Always use  your most recent med list.          acetaminophen 325 MG tablet Commonly known as:  TYLENOL   bisacodyl 10 MG suppository Commonly known as:  DULCOLAX Place 1 suppository (10 mg total) rectally daily as needed for moderate constipation (May repeat times one).   desonide 0.05 % cream Commonly known as:  DESOWEN   diclofenac sodium 1 % Gel Commonly known as:  VOLTAREN   divalproex 125 MG capsule Commonly known as:  DEPAKOTE SPRINKLE   DULoxetine 60 MG capsule Commonly known as:  CYMBALTA   feeding supplement (PRO-STAT SUGAR FREE 64) Liqd   Fish Oil 1000 MG Cpdr   fluorometholone 0.1 % ophthalmic suspension Commonly known as:  FML   fluticasone 50 MCG/ACT nasal spray Commonly known as:  FLONASE   gabapentin 300 MG capsule Commonly known as:  NEURONTIN   guaiFENesin 600 MG 12 hr tablet Commonly known as:  MUCINEX   loratadine 10 MG tablet Commonly known as:  CLARITIN   magnesium hydroxide 400 MG/5ML suspension Commonly known as:  MILK OF MAGNESIA   methadone 5 MG tablet Commonly  known as:  DOLOPHINE Take 1 tablet (5 mg total) by mouth at bedtime.   methocarbamol 500 MG tablet Commonly known as:  ROBAXIN   multivitamin tablet   omeprazole 20 MG capsule Commonly known as:  PRILOSEC   oxyCODONE 5 MG immediate release tablet Commonly known as:  Oxy IR/ROXICODONE Take 1-2 tablets (5-10 mg total) by mouth every 8 (eight) hours as needed. For pain   OXYGEN   polyethylene glycol packet Commonly known as:  MIRALAX / GLYCOLAX   rivaroxaban 20 MG Tabs tablet Commonly known as:  XARELTO   temazepam 30 MG capsule Commonly known as:  RESTORIL Take 1 capsule (30 mg total) at bedtime as needed by mouth for sleep.   timolol 0.5 % ophthalmic solution Commonly known as:  BETIMOL   torsemide 20 MG tablet Commonly known as:  DEMADEX   UNABLE TO FIND   ZADITOR OP        SIGNIFICANT DIAGNOSTIC EXAMS  PREVIOUS  01-20-16; chest x-ray: 1. Cardiomegaly and mild interstitial edema. 2. Improved aeration.  01-21-16: ct of abdomen and pelvis: 1. Apparent wall thickening at the rectum could reflect mild proctitis. 2. Small bilateral pleural effusions noted. Bibasilar airspace opacities may reflect atelectasis or possibly infection. 3. Large right-sided hiatal hernia, containing most of the stomach. 4. Mild soft tissue inflammation suggested about the bladder. Would correlate for any evidence of cystitis. 5. Nonspecific soft tissue inflammation suggested about the transverse colon. 6. Mild inflammation about the gallbladder is nonspecific, given the underlying diffuse soft tissue inflammation about the abdomen. 7. Very large anterior abdominal wall defect again noted, with packing material and underlying soft tissue thickening. 8. Scattered diverticulosis along the distal sigmoid colon, without evidence of diverticulitis. 9. Scattered aortic atherosclerosis noted. 10. Right convex thoracolumbar scoliosis noted.   01-21-16 ct of neck: 1. Findings consistent with right  parotiditis. No abscess identified. No significant lymphadenopathy. 2. Partially visualized small right pleural effusion and dependent right upper lobe opacity which may represent associated pneumonia or atelectasis.  07-03-16: right hip x-ray:No evidence of fracture or dislocation.   07-03-16: lumbar spine x-ray: No evidence of fracture or subluxation along the lumbar spine. Right convex thoracolumbar scoliosis again noted, with underlying degenerative change.  NO NEW EXAMS   LABS REVIEWED:PREVIOUS    01-20-16; wbc 13.8; hgb 10.2; hct 32.6; mcv 95.0; plt 447; gluocose 109; bun  14; creat 0.62; k+ 4.7; na++ 131; alk phos 130; albumin 1.9 01-23-16: wbc 15.1; hgb 8.7; hct 27.2; mcv 93.6; plt 381; glucose 92; bun 8; creat 0.62; k+ 3.8; na++ 134 02-15-16: wbc 9.5; hgb 9.9; hct 31; plt 379; glucose 110; bun 9; creat 0.6; k+ 4.3; na++ 135  05-30-16: glucose 112; bun 12; creat 0.6; k+ 4.3 ;na++ 145 08-07-16: wbc 11.0; hgb 10-28-16: glucose 97 bun 15.2; creat 0.46; k+ 4.3; na++ 132; ca 9.3; liver normal albumin 3.4  11-28-16:glucose 83; bun 11.0; creat 0.65; k+ 4.2; na++ 133; ca 9.7 11.1; hct 34; plt 226   NO NEW LABS     Review of Systems  Constitutional: Negative for malaise/fatigue.  Respiratory: Negative for cough and shortness of breath.   Cardiovascular: Negative for chest pain, palpitations and leg swelling.  Gastrointestinal: Negative for abdominal pain, constipation and heartburn.  Musculoskeletal: Positive for back pain. Negative for myalgias.       Pain is managed   Skin: Negative.   Neurological: Negative for dizziness.  Psychiatric/Behavioral: The patient is not nervous/anxious.     Physical Exam  Constitutional: She is oriented to person, place, and time. No distress.  Neck: Neck supple. No thyromegaly present.  Cardiovascular: Normal rate, regular rhythm and intact distal pulses.  Murmur heard. Pulmonary/Chest: Effort normal and breath sounds normal. No respiratory distress.    Abdominal: Soft. Bowel sounds are normal. She exhibits no distension. There is no tenderness.  Musculoskeletal: She exhibits no edema.  Able to move upper extremities   Lymphadenopathy:    She has no cervical adenopathy.  Neurological: She is alert and oriented to person, place, and time.  Skin: Skin is warm and dry. She is not diaphoretic.  Abdominal wound: 7.8 x 10.8  cm  Psychiatric: She has a normal mood and affect.      ASSESSMENT/ PLAN:  TODAY:   1. Constipation: stable  will continue MOM 30 cc every other day as needed;  miralax  17 gm daily will monitor   2.  Chronic surgical wound due to Status post laparotomy without  removal of mesh: is stable  has large abdominal wound: will continue current treatment is followed by wound doctor   3. Insomnia: is stable will continue  restoril  30 mg nightly she has not tolerated lower doses    4. Bilateral STM:HDQQIW  femoral veins:is stable  will continue xarelto 20 mg daily    PREVIOUS:    5. CHF:EF 55-60% (06-21-14) is stable will continue demadex 20 mg daily with k+ 20 meq daily is  02 dependent   6. Gerd: stable  will continue prilosec 20 mg daily   7. Depression with anxiety:is stable  will continue cymbalta 60 mg daily takes depakote 500 mg nightly to stabilize mood.  8. Glaucoma: is without change  will continue timolol to both eyes twice daily   9. Allergic rhinits: is stable  will continue flonase and mucinex daily   10. Pulmonary arterial hypertension: stable  b/p 138/70 no changes will monitor   11. Chronic pain syndrome: has chronic bilateral low back pain:  Is stable  is on chronic narcotic therapy: will continue methadone 5 mg NIGHTLY robaxin 500 mg four  times daily as needed; neurontin 300 mg twice daily   voltaren gel 1 gm twice daily to knees oxycodone 5 or 10 mg every 8 hours as needed       MD is aware of resident's narcotic use and is in agreement with current  plan of care. We will attempt to wean resident as  apropriate     Ok Edwards NP [facility]  Contact [phone] Monday through Friday 8am- 5pm  After hours call [phone]

## 2017-01-30 DIAGNOSIS — L98499 Non-pressure chronic ulcer of skin of other sites with unspecified severity: Secondary | ICD-10-CM | POA: Diagnosis not present

## 2017-02-07 ENCOUNTER — Other Ambulatory Visit: Payer: Self-pay

## 2017-02-07 MED ORDER — TEMAZEPAM 30 MG PO CAPS: 30.0000 mg | ORAL_CAPSULE | Freq: Every day | ORAL | 0 refills | Status: DC

## 2017-02-07 NOTE — Telephone Encounter (Signed)
RX faxed to [facility] @ [phone], phone number [phone] 

## 2017-02-13 ENCOUNTER — Other Ambulatory Visit: Payer: Self-pay

## 2017-02-13 DIAGNOSIS — L98499 Non-pressure chronic ulcer of skin of other sites with unspecified severity: Secondary | ICD-10-CM | POA: Diagnosis not present

## 2017-02-13 MED ORDER — OXYCODONE HCL 5 MG PO TABS: 5.0000 mg | ORAL_TABLET | Freq: Three times a day (TID) | ORAL | 0 refills | Status: DC | PRN

## 2017-02-13 MED ORDER — METHADONE HCL 5 MG PO TABS: 5.0000 mg | ORAL_TABLET | Freq: Every day | ORAL | 0 refills | Status: DC

## 2017-02-13 NOTE — Telephone Encounter (Signed)
RX faxed to [facility] @ [phone], phone number [phone] 

## 2017-02-15 DIAGNOSIS — R100 Acute abdomen: Secondary | ICD-10-CM | POA: Diagnosis not present

## 2017-02-15 DIAGNOSIS — L8992 Pressure ulcer of unspecified site, stage 2: Secondary | ICD-10-CM | POA: Diagnosis not present

## 2017-02-15 DIAGNOSIS — T8189XS Other complications of procedures, not elsewhere classified, sequela: Secondary | ICD-10-CM | POA: Diagnosis not present

## 2017-02-23 ENCOUNTER — Encounter: Payer: Self-pay | Admitting: Adult Health

## 2017-02-23 ENCOUNTER — Non-Acute Institutional Stay (SKILLED_NURSING_FACILITY): Payer: PPO | Admitting: Adult Health

## 2017-02-23 DIAGNOSIS — I509 Heart failure, unspecified: Secondary | ICD-10-CM | POA: Diagnosis not present

## 2017-02-23 DIAGNOSIS — H409 Unspecified glaucoma: Secondary | ICD-10-CM

## 2017-02-23 DIAGNOSIS — F329 Major depressive disorder, single episode, unspecified: Secondary | ICD-10-CM

## 2017-02-23 DIAGNOSIS — K219 Gastro-esophageal reflux disease without esophagitis: Secondary | ICD-10-CM | POA: Diagnosis not present

## 2017-02-23 DIAGNOSIS — F32A Depression, unspecified: Secondary | ICD-10-CM

## 2017-02-23 NOTE — Progress Notes (Signed)
Location:   [facility] Room Number: 105 A Place of Service:  SNF (31)   CODE STATUS: DNR  Allergies  Allergen Reactions  . Tape Other (See Comments)    SKIN IS VERY THIN; IT BRUISES AND TEARS EASILY!! -THX    Chief Complaint  Patient presents with  . Medical Management of Chronic Issues    Congestive heart failure; gerd; depression; glaucoma     HPI:  She is an 83 year old long term resident of this facility being seen for the management of her chronic illnesses: chronic congestive heart failure; gerd; and glaucoma. She does spend all of her time in bed per her choice. She denies any changes in appetite; her pain is being managed; and she continues with insomnia. There are no nursing concerns at this time.   Past Medical History:  Diagnosis Date  . Anemia   . Arthritis   . CHF (congestive heart failure) ([place])   . Depression   . GERD (gastroesophageal reflux disease)   . Hyponatremia 01/2016  . Perforated chronic gastric ulcer ([place]) 08/11/2014  . Pneumonia   . Pulmonary arterial hypertension ([place]) 11/28/2014    Past Surgical History:  Procedure Laterality Date  . ABDOMINAL HYSTERECTOMY    . ABDOMINAL SURGERY    . BACK SURGERY  2000  . COLON SURGERY     x2-blockage  . COLONOSCOPY    . ERCP    . HERNIA REPAIR  2012   x2 with mesh  . JOINT REPLACEMENT Left    shoulder  . LAPAROTOMY N/A 08/04/2014   Procedure: EXPLORATORY LAPAROTOMY, REPAIR OF PERFORATED ULCER;  Surgeon: Excell Seltzer, MD;  Location: [facility];  Service: General;  Laterality: N/A;  . LAPAROTOMY N/A 12/21/2015   Procedure: EXPLORATORY LAPAROTOMY/ REMOVAL OF MESH;  Surgeon: Coralie Keens, MD;  Location: [facility];  Service: General;  Laterality: N/A;  . ORIF WRIST FRACTURE  2012   left  . REVERSE SHOULDER ARTHROPLASTY Right 12/04/2013  . REVERSE SHOULDER ARTHROPLASTY  12/04/2013   Procedure: REVERSE SHOULDER ARTHROPLASTY;  Surgeon: Nita Sells, MD;  Location: [facility] OR;  Service:  Orthopedics;;  Right reverse total shoulder arthroplasty  . ROTATOR CUFF REPAIR     dr Tamera Punt  . SHOULDER ARTHROSCOPY  2012   lt and rt  . TENDON REPAIR Right 04/13/2014   Procedure: RIGHT HAND CENTRAL TENDON CENTRALIZATION OF LONG AND RING FINGERS;  Surgeon: Jolyn Nap, MD;  Location: [facility];  Service: Orthopedics;  Laterality: Right;    Social History   Socioeconomic History  . Marital status: Married    Spouse name: Not on file  . Number of children: Not on file  . Years of education: Not on file  . Highest education level: Not on file  Social Needs  . Financial resource strain: Not on file  . Food insecurity - worry: Not on file  . Food insecurity - inability: Not on file  . Transportation needs - medical: Not on file  . Transportation needs - non-medical: Not on file  Occupational History  . Not on file  Tobacco Use  . Smoking status: Never Smoker  . Smokeless tobacco: Never Used  Substance and Sexual Activity  . Alcohol use: Yes    Alcohol/week: 4.2 oz    Types: 7 Glasses of wine per week    Comment: 8 oz -12 oz of wine every   . Drug use: No  . Sexual activity: Not on file  Other Topics  Concern  . Not on file  Social History Narrative  . Not on file   Family History  Problem Relation Age of Onset  . Stroke Mother   . Breast cancer Sister   . Ulcerative colitis Daughter   . Chronic fatigue Daughter   . Chronic fatigue Daughter       VITAL SIGNS BP 120/64   Pulse 76   Temp 98.7 F (37.1 C)   Resp 18   Ht 5\' 3"  (1.6 m)   Wt 176 lb 9.6 oz (80.1 kg)   SpO2 95%   BMI 31.28 kg/m   Outpatient Encounter Medications as of 02/23/2017  Medication Sig Note  . acetaminophen (TYLENOL) 325 MG tablet Take 325 mg by mouth every 6 (six) hours as needed for mild pain.    . Amino Acids-Protein Hydrolys (FEEDING SUPPLEMENT, PRO-STAT SUGAR FREE 64,) LIQD Take 30 mLs by mouth 3 (three) times daily with meals.   . bisacodyl (DULCOLAX) 10 MG  suppository Place 1 suppository (10 mg total) rectally daily as needed for moderate constipation (May repeat times one).   Marland Kitchen desonide (DESOWEN) 0.05 % cream Apply to face topically as needed for rash/redness two times daily as needed   . diclofenac sodium (VOLTAREN) 1 % GEL Apply 1 g topically 2 (two) times daily. Apply to bilateral knees for pain   . divalproex (DEPAKOTE SPRINKLE) 125 MG capsule Give 4 Capsules (500 mg) by mouth at bedtime   . DULoxetine (CYMBALTA) 60 MG capsule Take 60 mg by mouth at bedtime.    . fluorometholone (FML) 0.1 % ophthalmic suspension Place 1 drop into both eyes 3 (three) times daily.   . fluticasone (FLONASE) 50 MCG/ACT nasal spray Place 1 spray into both nostrils at bedtime.   . gabapentin (NEURONTIN) 300 MG capsule Take 300 mg by mouth 2 (two) times daily.   Marland Kitchen guaiFENesin (MUCINEX) 600 MG 12 hr tablet Take 600 mg by mouth daily.   Marland Kitchen Ketotifen Fumarate (ZADITOR OP) Instill 1 drop in both eyes two times daily for Allergy   . loratadine (CLARITIN) 10 MG tablet Take 10 mg by mouth daily.   . magnesium hydroxide (MILK OF MAGNESIA) 400 MG/5ML suspension Take 60 mLs by mouth every other day. AS NEEDED FOR CONSTIPATION 08/04/2014: .   . methadone (DOLOPHINE) 5 MG tablet Take 1 tablet (5 mg total) by mouth at bedtime.   . methocarbamol (ROBAXIN) 500 MG tablet Take 500 mg by mouth every 6 (six) hours as needed.    . Multiple Vitamin (MULTIVITAMIN) tablet Take 1 tablet by mouth daily.   . Omega-3 Fatty Acids (FISH OIL) 1000 MG CPDR Take 1,000 mg by mouth daily.    Marland Kitchen omeprazole (PRILOSEC) 20 MG capsule Take 20 mg by mouth daily.   Marland Kitchen oxyCODONE (OXY IR/ROXICODONE) 5 MG immediate release tablet Take 1-2 tablets (5-10 mg total) by mouth every 8 (eight) hours as needed. For pain   . OXYGEN Inhale into the lungs. 2-6 L/min to keep stat at 90 or above   . polyethylene glycol (MIRALAX / GLYCOLAX) packet Take 17 g by mouth daily.    . rivaroxaban (XARELTO) 20 MG TABS tablet Take 20 mg  by mouth daily.    . temazepam (RESTORIL) 30 MG capsule Take 1 capsule (30 mg total) by mouth at bedtime.   . timolol (BETIMOL) 0.5 % ophthalmic solution Place 1 drop into both eyes 2 (two) times daily.    Marland Kitchen torsemide (DEMADEX) 20 MG tablet Take 2 tablets  by mouth once daily   . UNABLE TO FIND House Supplement - Take 4 oz by mouth 2 times daily    No facility-administered encounter medications on file as of 02/23/2017.      SIGNIFICANT DIAGNOSTIC EXAMS  PREVIOUS  01-20-16; chest x-ray: 1. Cardiomegaly and mild interstitial edema. 2. Improved aeration.  01-21-16: ct of abdomen and pelvis: 1. Apparent wall thickening at the rectum could reflect mild proctitis. 2. Small bilateral pleural effusions noted. Bibasilar airspace opacities may reflect atelectasis or possibly infection. 3. Large right-sided hiatal hernia, containing most of the stomach. 4. Mild soft tissue inflammation suggested about the bladder. Would correlate for any evidence of cystitis. 5. Nonspecific soft tissue inflammation suggested about the transverse colon. 6. Mild inflammation about the gallbladder is nonspecific, given the underlying diffuse soft tissue inflammation about the abdomen. 7. Very large anterior abdominal wall defect again noted, with packing material and underlying soft tissue thickening. 8. Scattered diverticulosis along the distal sigmoid colon, without evidence of diverticulitis. 9. Scattered aortic atherosclerosis noted. 10. Right convex thoracolumbar scoliosis noted.   01-21-16 ct of neck: 1. Findings consistent with right parotiditis. No abscess identified. No significant lymphadenopathy. 2. Partially visualized small right pleural effusion and dependent right upper lobe opacity which may represent associated pneumonia or atelectasis.  07-03-16: right hip x-ray:No evidence of fracture or dislocation.   07-03-16: lumbar spine x-ray: No evidence of fracture or subluxation along the lumbar spine. Right  convex thoracolumbar scoliosis again noted, with underlying degenerative change.  NO NEW EXAMS   LABS REVIEWED:PREVIOUS    05-30-16: glucose 112; bun 12; creat 0.6; k+ 4.3 ;na++ 145 08-07-16: wbc 11.0; hgb 10-28-16: glucose 97 bun 15.2; creat 0.46; k+ 4.3; na++ 132; ca 9.3; liver normal albumin 3.4  11-28-16:glucose 83; bun 11.0; creat 0.65; k+ 4.2; na++ 133; ca 9.7  NO NEW LABS     Review of Systems  Constitutional: Negative for malaise/fatigue.  Respiratory: Negative for cough and shortness of breath.   Cardiovascular: Negative for chest pain, palpitations and leg swelling.  Gastrointestinal: Negative for abdominal pain, constipation and heartburn.  Musculoskeletal: Negative for back pain, joint pain and myalgias.       Has chronic back pain; which is managed   Skin: Negative.   Neurological: Negative for dizziness.  Psychiatric/Behavioral: The patient has insomnia. The patient is not nervous/anxious.    Physical Exam  Constitutional: She is oriented to person, place, and time.  Cardiovascular: Normal rate, regular rhythm and intact distal pulses.  Murmur heard. 1/6  Pulmonary/Chest: Effort normal and breath sounds normal. No respiratory distress.  Abdominal: Soft. Bowel sounds are normal. She exhibits no distension. There is no tenderness.  Musculoskeletal: She exhibits no edema.  Able to move upper extremities   Neurological: She is alert and oriented to person, place, and time.  Skin: Skin is warm and dry.  Abdominal wound; 5.5 x 12.0 x nm cm   Psychiatric: She has a normal mood and affect.      ASSESSMENT/ PLAN:  TODAY:   1. CHF:EF 55-60% (06-21-14) is stable will continue demadex 40 mg daily with k+ 20 meq daily is  02 dependent   2. Gerd: stable  will continue prilosec 20 mg daily   3. Depression with anxiety:is stable  will continue cymbalta 60 mg daily takes depakote 500 mg nightly to stabilize mood. GDR: She is emotionally stable; lower her medications would  cause her emotional harm.   4. Glaucoma: is without change  will continue timolol  to both eyes twice daily fluorometholone 1 drop both eyes three times daily zaditor 1 drop both eyes twice daily   PREVIOUS:    5. Allergic rhinits: is stable  will continue flonase and mucinex daily   6. Pulmonary arterial hypertension: stable  b/p 120/64 is currently not on medications;  no changes will monitor   7. Chronic pain syndrome: has chronic bilateral low back pain:  Is stable  is on chronic narcotic therapy: will continue methadone 5 mg NIGHTLY robaxin 500 mg four  times daily as needed; neurontin 300 mg twice daily   voltaren gel 1 gm twice daily to knees oxycodone 5 or 10 mg every 8 hours as needed      8. Constipation: stable  will continue MOM 30 cc every other day as needed;  miralax  17 gm daily will monitor   9.  Chronic surgical wound due to Status post laparotomy without  removal of mesh: is stable  has large abdominal wound: will continue current treatment is followed by wound doctor   10. Insomnia: is stable will continue  restoril  30 mg nightly she has not tolerated lower doses    11. Bilateral CVE:LFYBOF  femoral veins:is stable  will continue xarelto 20 mg daily     Will check cbc; cmp   MD is aware of resident's narcotic use and is in agreement with current plan of care. We will attempt to wean resident as apropriate     Ok Edwards NP [facility]  Contact [phone] Monday through Friday 8am- 5pm  After hours call [phone]

## 2017-02-27 DIAGNOSIS — L98492 Non-pressure chronic ulcer of skin of other sites with fat layer exposed: Secondary | ICD-10-CM | POA: Diagnosis not present

## 2017-03-05 DIAGNOSIS — G8911 Acute pain due to trauma: Secondary | ICD-10-CM | POA: Diagnosis not present

## 2017-03-05 DIAGNOSIS — K0889 Other specified disorders of teeth and supporting structures: Secondary | ICD-10-CM | POA: Diagnosis not present

## 2017-03-08 ENCOUNTER — Other Ambulatory Visit: Payer: Self-pay

## 2017-03-08 ENCOUNTER — Encounter (HOSPITAL_COMMUNITY): Payer: Self-pay | Admitting: Emergency Medicine

## 2017-03-08 ENCOUNTER — Emergency Department (HOSPITAL_COMMUNITY)
Admission: EM | Admit: 2017-03-08 | Discharge: 2017-03-09 | Disposition: A | Discharge status: Home / Self Care | Payer: PPO | Attending: Emergency Medicine | Admitting: Emergency Medicine

## 2017-03-08 DIAGNOSIS — I509 Heart failure, unspecified: Secondary | ICD-10-CM | POA: Diagnosis not present

## 2017-03-08 DIAGNOSIS — M62838 Other muscle spasm: Secondary | ICD-10-CM | POA: Insufficient documentation

## 2017-03-08 DIAGNOSIS — Z7901 Long term (current) use of anticoagulants: Secondary | ICD-10-CM | POA: Diagnosis not present

## 2017-03-08 DIAGNOSIS — Z86711 Personal history of pulmonary embolism: Secondary | ICD-10-CM | POA: Insufficient documentation

## 2017-03-08 DIAGNOSIS — A419 Sepsis, unspecified organism: Secondary | ICD-10-CM | POA: Diagnosis not present

## 2017-03-08 DIAGNOSIS — R031 Nonspecific low blood-pressure reading: Secondary | ICD-10-CM | POA: Diagnosis not present

## 2017-03-08 DIAGNOSIS — Z79899 Other long term (current) drug therapy: Secondary | ICD-10-CM | POA: Insufficient documentation

## 2017-03-08 DIAGNOSIS — R262 Difficulty in walking, not elsewhere classified: Secondary | ICD-10-CM | POA: Diagnosis not present

## 2017-03-08 DIAGNOSIS — M79672 Pain in left foot: Secondary | ICD-10-CM | POA: Diagnosis not present

## 2017-03-08 DIAGNOSIS — M6281 Muscle weakness (generalized): Secondary | ICD-10-CM | POA: Diagnosis not present

## 2017-03-08 DIAGNOSIS — I110 Hypertensive heart disease with heart failure: Secondary | ICD-10-CM | POA: Diagnosis not present

## 2017-03-08 MED ORDER — TEMAZEPAM 30 MG PO CAPS: 30.0000 mg | ORAL_CAPSULE | Freq: Every day | ORAL | 0 refills | Status: DC

## 2017-03-08 MED ORDER — TEMAZEPAM 30 MG PO CAPS: 30.0000 mg | ORAL_CAPSULE | Freq: Every evening | ORAL | 0 refills | Status: DC | PRN

## 2017-03-08 NOTE — ED Triage Notes (Addendum)
Patient here from [place] with complaints of left foot pain x2 hours. "It feels like foot is being pulled down". Pain 10/10. Hx of CHF. Reports that she is due to go back to the pain clinic. States that she was given Methadone and Tylenol before she left facility.

## 2017-03-08 NOTE — ED Notes (Signed)
Bed: WA24 Expected date:  Expected time:  Means of arrival:  Comments: EMS 

## 2017-03-08 NOTE — Telephone Encounter (Signed)
Entered in error

## 2017-03-08 NOTE — Telephone Encounter (Signed)
RX faxed to [facility] @ [phone], phone number [phone] 

## 2017-03-08 NOTE — ED Notes (Signed)
Pulses palpated, doppler.

## 2017-03-09 ENCOUNTER — Encounter (HOSPITAL_COMMUNITY): Payer: Self-pay | Admitting: Emergency Medicine

## 2017-03-09 DIAGNOSIS — S90929A Unspecified superficial injury of unspecified foot, initial encounter: Secondary | ICD-10-CM | POA: Diagnosis not present

## 2017-03-09 DIAGNOSIS — R4182 Altered mental status, unspecified: Secondary | ICD-10-CM | POA: Diagnosis not present

## 2017-03-09 LAB — I-STAT CHEM 8, ED
BUN: 22 mg/dL — ABNORMAL HIGH (ref 6–20)
CALCIUM ION: 1.2 mmol/L (ref 1.15–1.40)
CHLORIDE: 92 mmol/L — AB (ref 101–111)
CREATININE: 0.7 mg/dL (ref 0.44–1.00)
GLUCOSE: 98 mg/dL (ref 65–99)
HCT: 34 % — ABNORMAL LOW (ref 36.0–46.0)
HEMOGLOBIN: 11.6 g/dL — AB (ref 12.0–15.0)
POTASSIUM: 4.4 mmol/L (ref 3.5–5.1)
Sodium: 131 mmol/L — ABNORMAL LOW (ref 135–145)
TCO2: 31 mmol/L (ref 22–32)

## 2017-03-09 MED ORDER — POTASSIUM CHLORIDE CRYS ER 20 MEQ PO TBCR
20.0000 meq | EXTENDED_RELEASE_TABLET | Freq: Once | ORAL | Status: AC
  Administered 2017-03-09: 20 meq via ORAL
  Filled 2017-03-09: qty 1

## 2017-03-09 MED ORDER — KETOROLAC TROMETHAMINE 60 MG/2ML IM SOLN
30.0000 mg | Freq: Once | INTRAMUSCULAR | Status: AC
  Administered 2017-03-09: 30 mg via INTRAMUSCULAR
  Filled 2017-03-09: qty 2

## 2017-03-09 MED ORDER — MAGNESIUM OXIDE 400 (241.3 MG) MG PO TABS
200.0000 mg | ORAL_TABLET | Freq: Every day | ORAL | Status: DC
  Administered 2017-03-09: 200 mg via ORAL
  Filled 2017-03-09: qty 1

## 2017-03-09 MED ORDER — LIDOCAINE 5 % EX PTCH
1.0000 | MEDICATED_PATCH | CUTANEOUS | Status: DC
  Administered 2017-03-09: 1 via TRANSDERMAL
  Filled 2017-03-09: qty 1

## 2017-03-09 NOTE — ED Notes (Signed)
Patient asleep.

## 2017-03-09 NOTE — ED Notes (Signed)
Report given to [place].  

## 2017-03-09 NOTE — ED Provider Notes (Signed)
[facility] DEPT Provider Note   CSN: [id number] Arrival date & time: 03/08/17  2335     History   Chief Complaint Chief Complaint  Patient presents with  . Foot Pain    HPI Monique Russo is a 83 y.o. female.  The history is provided by the patient.  Foot Pain  This is a new problem. The current episode started less than 1 hour ago. The problem occurs constantly. The problem has not changed since onset.Pertinent negatives include no chest pain, no abdominal pain, no headaches and no shortness of breath. Nothing aggravates the symptoms. Nothing relieves the symptoms. Treatments tried: methadone etc. The treatment provided no relief.  cramp in gastrocnemius into the left foot. Foot is warm and there has been no trauma.  No falls no change in color or temperature.     Past Medical History:  Diagnosis Date  . Anemia   . Arthritis   . CHF (congestive heart failure) ([place])   . Depression   . GERD (gastroesophageal reflux disease)   . Hyponatremia 01/2016  . Perforated chronic gastric ulcer ([place]) 08/11/2014  . Pneumonia   . Pulmonary arterial hypertension ([place]) 11/28/2014    Patient Active Problem List   Diagnosis Date Noted  . Glaucoma 01/15/2017  . Seasonal allergic rhinitis 01/15/2017  . Deep vein thrombosis (DVT) of femoral vein of both lower extremities ([place]) 10/20/2016  . Chronic bilateral deep vein thrombosis (DVT) of femoral veins ([place]) 10/14/2016  . Cellulitis of periorbital region of both eyes 10/04/2016  . Depression 06/10/2016  . Surgical wound, non healing, subsequent encounter 05/29/2016  . Insomnia 05/20/2016  . Constipation due to opioid therapy 04/08/2016  . Chronic narcotic use 02/15/2016  . GERD (gastroesophageal reflux disease) 02/15/2016  . Hypotension   . Protein-calorie malnutrition, severe ([place])   . Chronic bilateral low back pain without sciatica   . Pulmonary arterial hypertension ([place]) 11/28/2014  . Chronic pain  syndrome   . CHF (congestive heart failure) ([place]) 07/03/2013    Past Surgical History:  Procedure Laterality Date  . ABDOMINAL HYSTERECTOMY    . ABDOMINAL SURGERY    . BACK SURGERY  2000  . COLON SURGERY     x2-blockage  . COLONOSCOPY    . ERCP    . HERNIA REPAIR  2012   x2 with mesh  . JOINT REPLACEMENT Left    shoulder  . LAPAROTOMY N/A 08/04/2014   Procedure: EXPLORATORY LAPAROTOMY, REPAIR OF PERFORATED ULCER;  Surgeon: Excell Seltzer, MD;  Location: [facility];  Service: General;  Laterality: N/A;  . LAPAROTOMY N/A 12/21/2015   Procedure: EXPLORATORY LAPAROTOMY/ REMOVAL OF MESH;  Surgeon: Coralie Keens, MD;  Location: [facility];  Service: General;  Laterality: N/A;  . ORIF WRIST FRACTURE  2012   left  . REVERSE SHOULDER ARTHROPLASTY Right 12/04/2013  . REVERSE SHOULDER ARTHROPLASTY  12/04/2013   Procedure: REVERSE SHOULDER ARTHROPLASTY;  Surgeon: Nita Sells, MD;  Location: [facility] OR;  Service: Orthopedics;;  Right reverse total shoulder arthroplasty  . ROTATOR CUFF REPAIR     dr Tamera Punt  . SHOULDER ARTHROSCOPY  2012   lt and rt  . TENDON REPAIR Right 04/13/2014   Procedure: RIGHT HAND CENTRAL TENDON CENTRALIZATION OF LONG AND RING FINGERS;  Surgeon: Jolyn Nap, MD;  Location: [facility];  Service: Orthopedics;  Laterality: Right;    OB History    No data available       Home Medications    Prior  to Admission medications   Medication Sig Start Date End Date Taking? Authorizing Provider  acetaminophen (TYLENOL) 325 MG tablet Take 325 mg by mouth every 6 (six) hours as needed for mild pain.  07/10/13  Yes Dellinger, Bobby Rumpf, PA-C  Amino Acids-Protein Hydrolys (FEEDING SUPPLEMENT, PRO-STAT SUGAR FREE 64,) LIQD Take 30 mLs by mouth 3 (three) times daily with meals.   Yes [provider]  bisacodyl (DULCOLAX) 10 MG suppository Place 1 suppository (10 mg total) rectally daily as needed for moderate constipation (May repeat times one).  07/10/13  Yes Dellinger, Bobby Rumpf, PA-C  desonide (DESOWEN) 0.05 % cream Apply to face topically as needed for rash/redness two times daily as needed 01/12/17  Yes [provider]  diclofenac sodium (VOLTAREN) 1 % GEL Apply 1 g topically 2 (two) times daily. Apply to bilateral knees for pain   Yes [provider]  divalproex (DEPAKOTE SPRINKLE) 125 MG capsule Give 4 Capsules (500 mg) by mouth at bedtime   Yes [provider]  DULoxetine (CYMBALTA) 60 MG capsule Take 60 mg by mouth at bedtime.    Yes [provider]  fluorometholone (FML) 0.1 % ophthalmic suspension Place 1 drop into both eyes 3 (three) times daily.   Yes [provider]  fluticasone (FLONASE) 50 MCG/ACT nasal spray Place 1 spray into both nostrils at bedtime.   Yes [provider]  gabapentin (NEURONTIN) 300 MG capsule Take 300 mg by mouth 2 (two) times daily.   Yes [provider]  guaiFENesin (MUCINEX) 600 MG 12 hr tablet Take 600 mg by mouth daily.   Yes [provider]  Ketotifen Fumarate (ZADITOR OP) Instill 1 drop in both eyes two times daily for Allergy   Yes [provider]  loratadine (CLARITIN) 10 MG tablet Take 10 mg by mouth daily. 01/20/17  Yes [provider]  magnesium hydroxide (MILK OF MAGNESIA) 400 MG/5ML suspension Take 60 mLs by mouth every other day. AS NEEDED FOR CONSTIPATION   Yes [provider]  methadone (DOLOPHINE) 5 MG tablet Take 1 tablet (5 mg total) by mouth at bedtime. 02/13/17  Yes [name], NP  methocarbamol (ROBAXIN) 500 MG tablet Take 500 mg by mouth every 6 (six) hours as needed.    Yes [provider]  Multiple Vitamin (MULTIVITAMIN) tablet Take 1 tablet by mouth daily.   Yes [provider]  Omega-3 Fatty Acids (FISH OIL) 1000 MG CPDR Take 1,000 mg by mouth daily.    Yes [provider]  omeprazole (PRILOSEC) 20 MG capsule Take 20 mg by mouth daily.   Yes  [provider]  oxyCODONE (OXY IR/ROXICODONE) 5 MG immediate release tablet Take 1-2 tablets (5-10 mg total) by mouth every 8 (eight) hours as needed. For pain Patient taking differently: Take 5-10 mg by mouth every 8 (eight) hours as needed. 1 tablet for moderate pain 2 tablets for severe pain 02/13/17  Yes [name], NP  polyethylene glycol (MIRALAX / GLYCOLAX) packet Take 17 g by mouth daily.    Yes [provider]  rivaroxaban (XARELTO) 20 MG TABS tablet Take 20 mg by mouth daily.    Yes [provider]  temazepam (RESTORIL) 30 MG capsule Take 1 capsule (30 mg total) by mouth at bedtime as needed for sleep. 03/08/17  Yes [name], NP  timolol (BETIMOL) 0.5 % ophthalmic solution Place 1 drop into both eyes 2 (two) times daily.    Yes [provider]  torsemide (DEMADEX) 20 MG tablet Take 2 tablets by mouth once daily   Yes [provider]  UNABLE TO [place] - Take 4 oz by mouth 2 times daily   Yes [provider]  OXYGEN Inhale into the lungs. 2-6 L/min to keep stat at 90 or above    [provider]    Family History Family History  Problem Relation Age of Onset  . Stroke Mother   . Breast cancer Sister   . Ulcerative colitis Daughter   . Chronic fatigue Daughter   . Chronic fatigue Daughter     Social History Social History   Tobacco Use  . Smoking status: Never Smoker  . Smokeless tobacco: Never Used  Substance Use Topics  . Alcohol use: Yes    Alcohol/week: 4.2 oz    Types: 7 Glasses of wine per week    Comment: 8 oz -12 oz of wine every   . Drug use: No     Allergies   Tape   Review of Systems Review of Systems  Respiratory: Negative for shortness of breath.   Cardiovascular: Negative for chest pain.  Gastrointestinal: Negative for abdominal pain.  Musculoskeletal: Positive for arthralgias.  Neurological: Negative for headaches.  All other systems reviewed and are  negative.    Physical Exam Updated Vital Signs BP (!) 130/59 (BP Location: Left Arm)   Pulse 73   Temp 98 F (36.7 C) (Oral)   Resp 18   SpO2 100%   Physical Exam  Constitutional: She is oriented to person, place, and time. She appears well-developed and well-nourished. No distress.  HENT:  Head: Normocephalic and atraumatic.  Mouth/Throat: No oropharyngeal exudate.  Eyes: Conjunctivae are normal. Pupils are equal, round, and reactive to light.  Neck: Normal range of motion. Neck supple.  Cardiovascular: Normal rate, regular rhythm, normal heart sounds and intact distal pulses.  Pulmonary/Chest: Effort normal and breath sounds normal. No stridor. She has no wheezes. She has no rales.  Abdominal: Soft. Bowel sounds are normal. She exhibits no mass. There is no tenderness. There is no rebound and no guarding.  Musculoskeletal: Normal range of motion.       Left ankle: Normal. Achilles tendon normal.       Left foot: Normal.  3+ dorsalis pedis  Neurological: She is alert and oriented to person, place, and time. She displays normal reflexes.  Skin: Skin is warm and dry. Capillary refill takes less than 2 seconds.  Psychiatric: She has a normal mood and affect.     ED Treatments / Results   Vitals:   03/08/17 2344 03/09/17 0020  BP: 129/60 (!) 130/59  Pulse: 74 73  Resp: 17 18  Temp: 98 F (36.7 C)   SpO2: 94% 100%    Labs (all labs ordered are listed, but only abnormal results are displayed)  Results for orders placed or performed during the hospital encounter of 03/08/17  I-stat chem 8, ed  Result Value Ref Range   Sodium 131 (L) 135 - 145 mmol/L   Potassium 4.4 3.5 - 5.1 mmol/L   Chloride 92 (L) 101 - 111 mmol/L   BUN 22 (H) 6 - 20 mg/dL   Creatinine, Ser 0.70 0.44 - 1.00 mg/dL   Glucose, Bld 98 65 - 99 mg/dL   Calcium, Ion 1.20 1.15 - 1.40 mmol/L   TCO2 31 22 - 32 mmol/L   Hemoglobin 11.6 (L) 12.0 - 15.0 g/dL   HCT 34.0 (L) 36.0 - 46.0 %  No results  found.  Procedures Procedures (including critical care time)  Medications Ordered in ED Medications  magnesium oxide (MAG-OX) tablet 200 mg (200 mg Oral Given 03/09/17 0020)  potassium chloride SA (K-DUR,KLOR-CON) CR tablet 20 mEq (20 mEq Oral Given 03/09/17 0116)    Final Clinical Impressions(s) / ED Diagnoses   Exam and history consistent with a Charlie horse.  Foot placed in dorsiflexion.  Low dose potassium and magnesium given with good effect.   Return for leg swelling, coldness, change in color, fevers > 101, streaking up the leg, global weakness, stiff neck, intractable vomiting, or diarrhea, abdominal pain, Inability to tolerate liquids or food, cough, altered mental status or any concerns. No signs of systemic illness or infection. The patient is nontoxic-appearing on exam and vital signs are within normal limits.    I have reviewed the triage vital signs and the nursing notes. Pertinent labs &imaging results that were available during my care of the patient were reviewed by me and considered in my medical decision making (see chart for details).  After history, exam, and medical workup I feel the patient has been appropriately medically screened and is safe for discharge home. Pertinent diagnoses were discussed with the patient. Patient was given return precautions   Larken Urias, MD 03/09/17 4503

## 2017-03-15 ENCOUNTER — Other Ambulatory Visit: Payer: Self-pay

## 2017-03-15 MED ORDER — OXYCODONE HCL 5 MG PO TABS: ORAL_TABLET | ORAL | 0 refills | Status: DC

## 2017-03-15 MED ORDER — TEMAZEPAM 30 MG PO CAPS: 30.0000 mg | ORAL_CAPSULE | Freq: Every evening | ORAL | 0 refills | Status: DC | PRN

## 2017-03-15 NOTE — Telephone Encounter (Signed)
RX faxed to [facility] @ [phone], phone number [phone] 

## 2017-03-17 ENCOUNTER — Other Ambulatory Visit: Payer: Self-pay | Admitting: Internal Medicine

## 2017-03-17 DIAGNOSIS — M545 Low back pain, unspecified: Secondary | ICD-10-CM

## 2017-03-17 DIAGNOSIS — G894 Chronic pain syndrome: Secondary | ICD-10-CM

## 2017-03-17 DIAGNOSIS — G8929 Other chronic pain: Secondary | ICD-10-CM

## 2017-03-17 MED ORDER — METHADONE HCL 5 MG PO TABS: 5.0000 mg | ORAL_TABLET | Freq: Every day | ORAL | 0 refills | Status: DC

## 2017-03-17 NOTE — Progress Notes (Signed)
Received call at 10:30pm on 12/29, that pt is out of her methadone and due to receive it tonight at bedtime.  Will fax Rx electronically to [facility] for an emergency supply for 72 hrs until Rx can be written at facility.    [employer]on [employer] L. Kayden Amend, D.O. [facility] [address], [place] [address] Cell Phone (Mon-Fri 8am-5pm):  [phone] On Call:  [phone] & follow prompts after 5pm & weekends Office Phone:  [phone] Office Fax:  [phone]

## 2017-03-20 DIAGNOSIS — M6281 Muscle weakness (generalized): Secondary | ICD-10-CM | POA: Diagnosis not present

## 2017-03-20 DIAGNOSIS — A419 Sepsis, unspecified organism: Secondary | ICD-10-CM | POA: Diagnosis not present

## 2017-03-20 DIAGNOSIS — R262 Difficulty in walking, not elsewhere classified: Secondary | ICD-10-CM | POA: Diagnosis not present

## 2017-03-21 DIAGNOSIS — T8189XS Other complications of procedures, not elsewhere classified, sequela: Secondary | ICD-10-CM | POA: Diagnosis not present

## 2017-03-22 ENCOUNTER — Non-Acute Institutional Stay (SKILLED_NURSING_FACILITY): Payer: PPO | Admitting: Internal Medicine

## 2017-03-22 ENCOUNTER — Encounter: Payer: Self-pay | Admitting: Internal Medicine

## 2017-03-22 DIAGNOSIS — G8929 Other chronic pain: Secondary | ICD-10-CM

## 2017-03-22 DIAGNOSIS — I2721 Secondary pulmonary arterial hypertension: Secondary | ICD-10-CM

## 2017-03-22 DIAGNOSIS — I82413 Acute embolism and thrombosis of femoral vein, bilateral: Secondary | ICD-10-CM

## 2017-03-22 DIAGNOSIS — J302 Other seasonal allergic rhinitis: Secondary | ICD-10-CM | POA: Diagnosis not present

## 2017-03-22 DIAGNOSIS — I509 Heart failure, unspecified: Secondary | ICD-10-CM

## 2017-03-22 DIAGNOSIS — M545 Low back pain, unspecified: Secondary | ICD-10-CM

## 2017-03-22 DIAGNOSIS — G894 Chronic pain syndrome: Secondary | ICD-10-CM | POA: Diagnosis not present

## 2017-03-22 DIAGNOSIS — F418 Other specified anxiety disorders: Secondary | ICD-10-CM

## 2017-03-22 NOTE — Progress Notes (Signed)
Patient ID: Monique Russo, female   DOB: Aug 25, 1930, 84 y.o.   MRN: [id number]   Location:  [facility] Room Number: 105 A Place of Service:  SNF (31) Provider:  Brady, [place], DO  Patient Care Team: Gildardo Cranker, DO as PCP - General (Internal Medicine) Nyoka Cowden Phylis Bougie, NP as Nurse Practitioner ([place]) [facility], [place] ([place])  Extended Emergency Contact Information Primary Emergency Contact: Hollifield,Bill Address: [address]          [place], [place] [address] Johnnette Litter of [place] Phone: [phone] Mobile Phone: [phone] Relation: Spouse Secondary Emergency Contact: Faythe Ghee Address: [address]          [place], [place] [address] Johnnette Litter of [place] Phone: [phone] Mobile Phone: [phone] Relation: Sister  Code Status:  DNR Goals of care: Advanced Directive information Advanced Directives 03/22/2017  Does Patient Have a Medical Advance Directive? Yes  Type of Advance Directive Out of facility DNR (pink MOST or yellow form)  Does patient want to make changes to medical advance directive? No - Patient declined  Copy of [place] in Chart? -  Would patient like information on creating a medical advance directive? No - Patient declined  Pre-existing out of facility DNR order (yellow form or pink MOST form) Yellow form placed in chart (order not valid for inpatient use);Pink MOST form placed in chart (order not valid for inpatient use)     Chief Complaint  Patient presents with  . Medical Management of Chronic Issues    Routine Visit    HPI:  Pt is a 84 y.o. female seen today for medical management of chronic diseases.  She was seen in the ED 03/08/17 for muscle spasm. Na 131; K 4.4;  Hgb 11.6. She was tx with low dose K/Mg and released back to SNF. Today she reports severe pain in back 2/2 lumbar DDD radiating into legs. No falls. Last  right SI joint injection given 12/2016 by Dr Gilmer Mor at [facility] in [place]. She requests to return to pain clinic for back injection. No loss of bowel/bladder control  CHF - EF 55-60% (06-21-14); stable on demadex 40 mg daily with k+ 20 meq daily; O2 dependent   GERD - stable on prilosec 20 mg daily   Depression with anxiety - mood stable on cymbalta 60 mg daily; depakote 500 mg nightly; she is emotionally stable and lowering her medications would cause her emotional harm.   Glaucoma - stable on timolol to both eyes twice daily; fluorometholone 1 drop both eyes three times daily; zaditor 1 drop both eyes twice daily   Allergic rhinitis - stable on flonase and mucinex daily   Pulmonary arterial hypertension - stable without medications  Chronic pain syndrome/chroni bilateral low back pain - stable on chronic narcotic therapy with methadone 5 mg nightly; robaxin 500 mg QID prn; neurontin 300 mg twice daily; voltaren gel 1 gm twice daily to knees; oxycodone 5 or 10 mg every 8 hours as needed      Constipation - stable on MOM 30 cc every other day as needed;  miralax  17 gm daily  Chronic surgical wound s/p laparotomy without  removal of mesh -  stable large abdominal wound;  followed by facility wound provider   Insomnia - stable on restoril  30 mg nightly   b/l DVT in femoral veins - stable on chronic xarelto 20 mg daily  Past Medical History:  Diagnosis Date  . Anemia   . Arthritis   . CHF (congestive heart failure) ([place])   . Depression   . GERD (gastroesophageal reflux disease)   . Hyponatremia 01/2016  . Perforated chronic gastric ulcer ([place]) 08/11/2014  . Pneumonia   . Pulmonary arterial hypertension ([place]) 11/28/2014   Past Surgical History:  Procedure Laterality Date  . ABDOMINAL HYSTERECTOMY    . ABDOMINAL SURGERY    . BACK SURGERY  2000  . COLON SURGERY     x2-blockage  . COLONOSCOPY    . ERCP    . HERNIA REPAIR  2012   x2 with mesh  . JOINT  REPLACEMENT Left    shoulder  . LAPAROTOMY N/A 08/04/2014   Procedure: EXPLORATORY LAPAROTOMY, REPAIR OF PERFORATED ULCER;  Surgeon: Excell Seltzer, MD;  [facility];  Service: General;  Laterality: N/A;  . LAPAROTOMY N/A 12/21/2015   Procedure: EXPLORATORY LAPAROTOMY/ REMOVAL OF MESH;  Surgeon: Coralie Keens, MD;  Location: [facility];  Service: General;  Laterality: N/A;  . ORIF WRIST FRACTURE  2012   left  . REVERSE SHOULDER ARTHROPLASTY Right 12/04/2013  . REVERSE SHOULDER ARTHROPLASTY  12/04/2013   Procedure: REVERSE SHOULDER ARTHROPLASTY;  Surgeon: Nita Sells, MD;  Location: [facility];  Service: Orthopedics;;  Right reverse total shoulder arthroplasty  . ROTATOR CUFF REPAIR     dr Tamera Punt  . SHOULDER ARTHROSCOPY  2012   lt and rt  . TENDON REPAIR Right 04/13/2014   Procedure: RIGHT HAND CENTRAL TENDON CENTRALIZATION OF LONG AND RING FINGERS;  Surgeon: Jolyn Nap, MD;  Location: [facility];  Service: Orthopedics;  Laterality: Right;    Allergies  Allergen Reactions  . Tape Other (See Comments)    SKIN IS VERY THIN; IT BRUISES AND TEARS EASILY!! -THX    Outpatient Encounter Medications as of 03/22/2017  Medication Sig  . acetaminophen (TYLENOL) 325 MG tablet Take 325 mg by mouth every 6 (six) hours as needed for mild pain.   . Amino Acids-Protein Hydrolys (FEEDING SUPPLEMENT, PRO-STAT SUGAR FREE 64,) LIQD Take 30 mLs by mouth 3 (three) times daily with meals.  . bisacodyl (DULCOLAX) 10 MG suppository Place 1 suppository (10 mg total) rectally daily as needed for moderate constipation (May repeat times one).  Marland Kitchen desonide (DESOWEN) 0.05 % cream Apply to face topically as needed for rash/redness two times daily as needed  . diclofenac sodium (VOLTAREN) 1 % GEL Apply 1 g topically 2 (two) times daily. Apply to bilateral knees for pain  . divalproex (DEPAKOTE SPRINKLE) 125 MG capsule Give 4 Capsules (500 mg) by mouth at bedtime  . DULoxetine (CYMBALTA) 60  MG capsule Take 60 mg by mouth at bedtime.   . fluorometholone (FML) 0.1 % ophthalmic suspension Place 1 drop into both eyes 3 (three) times daily.  . fluticasone (FLONASE) 50 MCG/ACT nasal spray Place 1 spray into both nostrils at bedtime.  . gabapentin (NEURONTIN) 300 MG capsule Take 300 mg by mouth 2 (two) times daily.  Marland Kitchen guaiFENesin (MUCINEX) 600 MG 12 hr tablet Take 600 mg by mouth daily.  Marland Kitchen Ketotifen Fumarate (ZADITOR OP) Instill 1 drop in both eyes two times daily for Allergy  . loratadine (CLARITIN) 10 MG tablet Take 10 mg by mouth daily.  . magnesium hydroxide (MILK OF MAGNESIA) 400 MG/5ML suspension Take 60 mLs by mouth every other day. AS NEEDED FOR CONSTIPATION  . methadone (DOLOPHINE) 5 MG tablet Take 1 tablet (5 mg total)  by mouth at bedtime.  . methocarbamol (ROBAXIN) 500 MG tablet Take 500 mg by mouth every 6 (six) hours as needed.   . Multiple Vitamin (MULTIVITAMIN) tablet Take 1 tablet by mouth daily.  . Omega-3 Fatty Acids (FISH OIL) 1000 MG CPDR Take 1,000 mg by mouth daily.   Marland Kitchen omeprazole (PRILOSEC) 20 MG capsule Take 20 mg by mouth daily.  Marland Kitchen oxyCODONE (OXY IR/ROXICODONE) 5 MG immediate release tablet Give 2 tablets by mouth every 8 hours as needed for severe pain  . OXYGEN Inhale into the lungs. 2-6 L/min to keep stat at 90 or above  . polyethylene glycol (MIRALAX / GLYCOLAX) packet Take 17 g by mouth daily.   . rivaroxaban (XARELTO) 20 MG TABS tablet Take 20 mg by mouth daily.   . temazepam (RESTORIL) 30 MG capsule Take 1 capsule (30 mg total) by mouth at bedtime as needed for sleep.  Marland Kitchen timolol (BETIMOL) 0.5 % ophthalmic solution Place 1 drop into both eyes 2 (two) times daily.   Marland Kitchen torsemide (DEMADEX) 20 MG tablet Take 2 tablets by mouth once daily  . UNABLE TO FIND House Supplement - Take 4 oz by mouth 2 times daily   No facility-administered encounter medications on file as of 03/22/2017.     Review of Systems  Musculoskeletal: Positive for arthralgias, back pain and  gait problem (bed bound by pt preference. she reports she cannot walk due to severe back pain.).  All other systems reviewed and are negative.   Immunization History  Administered Date(s) Administered  . Influenza Split 02/03/2015  . Influenza-Unspecified 01/18/2017   Pertinent  Health Maintenance Due  Topic Date Due  . PNA vac Low Risk Adult (1 of 2 - PCV13) 06/01/2017 (Originally 08/29/1997)  . DEXA SCAN  06/02/2017 (Originally 08/29/1997)  . INFLUENZA VACCINE  Completed   Fall Risk  09/11/2016  Falls in the past year? No   Functional Status Survey:    Vitals:   03/22/17 1224  BP: 136/60  Pulse: 68  Resp: 18  Temp: 98 F (36.7 C)  TempSrc: Oral  SpO2: 98%  Weight: 176 lb 9.6 oz (80.1 kg)   Body mass index is 31.28 kg/m. Physical Exam  Constitutional: She is oriented to person, place, and time. She appears well-developed and well-nourished.  Frail appearing in NAD, lying in bed  HENT:  Mouth/Throat: Oropharynx is clear and moist. No oropharyngeal exudate.  MMM; no oral thrush  Eyes: Pupils are equal, round, and reactive to light. No scleral icterus.  Neck: Neck supple. Carotid bruit is not present. No tracheal deviation present. No thyromegaly present.  Cardiovascular: Normal rate, regular rhythm and intact distal pulses. Exam reveals no gallop and no friction rub.  Murmur (1/6 SEM) heard. Trace LE edema b/l. No calf TTP, no palpable cords  Pulmonary/Chest: Effort normal and breath sounds normal. No stridor. No respiratory distress. She has no wheezes. She has no rales.  Abdominal: Soft. Normal appearance and bowel sounds are normal. She exhibits no distension and no mass. There is no hepatomegaly. There is tenderness (mid abdomen at wound). There is no rigidity, no rebound and no guarding. No hernia.  obese  Musculoskeletal: She exhibits edema, tenderness and deformity (L>R toes, acquired).       Lumbar back: She exhibits decreased range of motion, tenderness and  spasm.  FROM toes  Lymphadenopathy:    She has no cervical adenopathy.  Neurological: She is alert and oriented to person, place, and time.  Skin: Skin is  warm and dry. Rash (local irritation 2/2 adhesive tape from wound dsg) noted.  Chronic abdominal wound - followed by facility wound care; no secondary signs of infection  Psychiatric: She has a normal mood and affect. Her behavior is normal. Judgment and thought content normal.    Labs reviewed: Recent Labs    04/10/16 0351  10/28/16 11/28/16 03/09/17 0022  NA 135   < > 132* 133* 131*  K 3.9   < > 4.3 4.2 4.4  CL 101  --   --   --  92*  CO2 29  --   --   --   --   GLUCOSE 103*  --   --   --  98  BUN 17   < > 15 11 22*  CREATININE 0.59   < > 0.5 0.7 0.70  CALCIUM 9.3  --   --   --   --    < > = values in this interval not displayed.   Recent Labs    04/10/16 0351 10/28/16  AST 16 11*  ALT 10* 6*  ALKPHOS 74 73  BILITOT 0.2*  --   PROT 7.0  --   ALBUMIN 2.6*  --    Recent Labs    04/10/16 0351 08/07/16 03/09/17 0022  WBC 9.2 11.0  --   NEUTROABS 5.3 8  --   HGB 10.0* 11.1* 11.6*  HCT 31.4* 34* 34.0*  MCV 92.4  --   --   PLT 392 226  --    Lab Results  Component Value Date   TSH 1.605 07/18/2014   No results found for: HGBA1C Lab Results  Component Value Date   TRIG 131 01/03/2016    Significant Diagnostic Results in last 30 days:  No results found.  Assessment/Plan   ICD-10-CM   1. Chronic bilateral low back pain without sciatica M54.5    G89.29   2. Chronic pain syndrome G89.4   3. Deep vein thrombosis (DVT) of femoral vein of both lower extremities, unspecified chronicity (HCC) I82.413   4. Depression with anxiety F41.8   5. Seasonal allergic rhinitis, unspecified trigger J30.2   6. Pulmonary arterial hypertension (HCC) I27.21   7. Chronic congestive heart failure, unspecified heart failure type ([place]) I50.9     Refer to pain management for lumbar epidural injection 2/2 DDD  Cont other meds as  ordered  PT/OT as indicated  Wound care as ordered  Will follow  Labs/tests ordered: f/u on CBC and CMP from Dec 2018   [place]. Perlie Gold  [facility] [address] [place], [place] [address] [phone] Cell (Monday-Friday 8 AM - 5 PM) [phone] After 5 PM and follow prompts

## 2017-03-23 ENCOUNTER — Other Ambulatory Visit: Payer: Self-pay

## 2017-03-23 DIAGNOSIS — M545 Low back pain, unspecified: Secondary | ICD-10-CM

## 2017-03-23 DIAGNOSIS — G8929 Other chronic pain: Secondary | ICD-10-CM

## 2017-03-23 DIAGNOSIS — G894 Chronic pain syndrome: Secondary | ICD-10-CM

## 2017-03-23 MED ORDER — METHADONE HCL 5 MG PO TABS: 5.0000 mg | ORAL_TABLET | Freq: Every day | ORAL | 0 refills | Status: DC

## 2017-03-23 NOTE — Telephone Encounter (Signed)
RX faxed to [facility] @ [phone], phone number [phone] 

## 2017-03-27 ENCOUNTER — Encounter: Payer: Self-pay | Admitting: Adult Health

## 2017-03-27 ENCOUNTER — Non-Acute Institutional Stay (SKILLED_NURSING_FACILITY): Payer: PPO | Admitting: Adult Health

## 2017-03-27 DIAGNOSIS — J209 Acute bronchitis, unspecified: Secondary | ICD-10-CM | POA: Diagnosis not present

## 2017-03-27 DIAGNOSIS — L98499 Non-pressure chronic ulcer of skin of other sites with unspecified severity: Secondary | ICD-10-CM | POA: Diagnosis not present

## 2017-03-27 NOTE — Progress Notes (Signed)
Location:   [facility] Room Number: 105 A Place of Service:  SNF (31)   CODE STATUS: DNR  Allergies  Allergen Reactions  . Tape Other (See Comments)    SKIN IS VERY THIN; IT BRUISES AND TEARS EASILY!! -THX    Chief Complaint  Patient presents with  . Acute Visit    Status    HPI:  She is complaining of a cough with questionable fevers in the past several days. There are no nursing reports of fevers present; but she states she had a 102 fever. She is coughing up sputum; but does not know color. She is complaining of increased shortness of breath. There are no reports of changes in appetite; no changes in sleep patter. She is a poor historian.   Past Medical History:  Diagnosis Date  . Anemia   . Arthritis   . CHF (congestive heart failure) ([place])   . Depression   . GERD (gastroesophageal reflux disease)   . Hyponatremia 01/2016  . Perforated chronic gastric ulcer ([place]) 08/11/2014  . Pneumonia   . Pulmonary arterial hypertension ([place]) 11/28/2014    Past Surgical History:  Procedure Laterality Date  . ABDOMINAL HYSTERECTOMY    . ABDOMINAL SURGERY    . BACK SURGERY  2000  . COLON SURGERY     x2-blockage  . COLONOSCOPY    . ERCP    . HERNIA REPAIR  2012   x2 with mesh  . JOINT REPLACEMENT Left    shoulder  . LAPAROTOMY N/A 08/04/2014   Procedure: EXPLORATORY LAPAROTOMY, REPAIR OF PERFORATED ULCER;  Surgeon: Excell Seltzer, MD;  Location: [facility];  Service: General;  Laterality: N/A;  . LAPAROTOMY N/A 12/21/2015   Procedure: EXPLORATORY LAPAROTOMY/ REMOVAL OF MESH;  Surgeon: Coralie Keens, MD;  Location: [facility];  Service: General;  Laterality: N/A;  . ORIF WRIST FRACTURE  2012   left  . REVERSE SHOULDER ARTHROPLASTY Right 12/04/2013  . REVERSE SHOULDER ARTHROPLASTY  12/04/2013   Procedure: REVERSE SHOULDER ARTHROPLASTY;  Surgeon: Nita Sells, MD;  Location: [facility] OR;  Service: Orthopedics;;  Right reverse total shoulder arthroplasty  . ROTATOR  CUFF REPAIR     dr Tamera Punt  . SHOULDER ARTHROSCOPY  2012   lt and rt  . TENDON REPAIR Right 04/13/2014   Procedure: RIGHT HAND CENTRAL TENDON CENTRALIZATION OF LONG AND RING FINGERS;  Surgeon: Jolyn Nap, MD;  Location: [facility];  Service: Orthopedics;  Laterality: Right;    Social History   Socioeconomic History  . Marital status: Married    Spouse name: Not on file  . Number of children: Not on file  . Years of education: Not on file  . Highest education level: Not on file  Social Needs  . Financial resource strain: Not on file  . Food insecurity - worry: Not on file  . Food insecurity - inability: Not on file  . Transportation needs - medical: Not on file  . Transportation needs - non-medical: Not on file  Occupational History  . Not on file  Tobacco Use  . Smoking status: Never Smoker  . Smokeless tobacco: Never Used  Substance and Sexual Activity  . Alcohol use: Yes    Alcohol/week: 4.2 oz    Types: 7 Glasses of wine per week    Comment: 8 oz -12 oz of wine every   . Drug use: No  . Sexual activity: Not on file  Other Topics Concern  . Not on file  Social History Narrative  . Not on file   Family History  Problem Relation Age of Onset  . Stroke Mother   . Breast cancer Sister   . Ulcerative colitis Daughter   . Chronic fatigue Daughter   . Chronic fatigue Daughter       VITAL SIGNS BP 136/60   Pulse 68   Temp 98 F (36.7 C)   Resp 18   Ht 5\' 3"  (1.6 m)   Wt 176 lb 9.6 oz (80.1 kg)   SpO2 98%   BMI 31.28 kg/m   Outpatient Encounter Medications as of 03/27/2017  Medication Sig Note  . acetaminophen (TYLENOL) 325 MG tablet Take 325 mg by mouth every 6 (six) hours as needed for mild pain.    . Amino Acids-Protein Hydrolys (FEEDING SUPPLEMENT, PRO-STAT SUGAR FREE 64,) LIQD Take 30 mLs by mouth 3 (three) times daily with meals.   . bisacodyl (DULCOLAX) 10 MG suppository Place 1 suppository (10 mg total) rectally daily as needed for  moderate constipation (May repeat times one).   Marland Kitchen desonide (DESOWEN) 0.05 % cream Apply to face topically as needed for rash/redness two times daily as needed   . diclofenac sodium (VOLTAREN) 1 % GEL Apply 1 g topically 2 (two) times daily. Apply to bilateral knees for pain   . divalproex (DEPAKOTE SPRINKLE) 125 MG capsule Give 4 Capsules (500 mg) by mouth at bedtime   . DULoxetine (CYMBALTA) 60 MG capsule Take 60 mg by mouth at bedtime.    . fluorometholone (FML) 0.1 % ophthalmic suspension Place 1 drop into both eyes 3 (three) times daily.   . fluticasone (FLONASE) 50 MCG/ACT nasal spray Place 1 spray into both nostrils at bedtime.   . gabapentin (NEURONTIN) 300 MG capsule Take 300 mg by mouth 2 (two) times daily.   Marland Kitchen guaiFENesin (MUCINEX) 600 MG 12 hr tablet Take 600 mg by mouth daily.   Marland Kitchen Ketotifen Fumarate (ZADITOR OP) Instill 1 drop in both eyes two times daily for Allergy   . loratadine (CLARITIN) 10 MG tablet Take 10 mg by mouth daily.   . magnesium hydroxide (MILK OF MAGNESIA) 400 MG/5ML suspension Take 60 mLs by mouth every other day. AS NEEDED FOR CONSTIPATION 08/04/2014: .   . methadone (DOLOPHINE) 5 MG tablet Take 1 tablet (5 mg total) by mouth at bedtime.   . methocarbamol (ROBAXIN) 500 MG tablet Take 500 mg by mouth every 6 (six) hours as needed.    . Multiple Vitamin (MULTIVITAMIN) tablet Take 1 tablet by mouth daily.   . Omega-3 Fatty Acids (FISH OIL) 1000 MG CPDR Take 1,000 mg by mouth daily.    Marland Kitchen omeprazole (PRILOSEC) 20 MG capsule Take 20 mg by mouth daily.   Marland Kitchen oxyCODONE (OXY IR/ROXICODONE) 5 MG immediate release tablet Give 2 tablets by mouth every 8 hours as needed for severe pain   . OXYGEN Inhale into the lungs. 2-6 L/min to keep stat at 90 or above   . polyethylene glycol (MIRALAX / GLYCOLAX) packet Take 17 g by mouth daily.    . rivaroxaban (XARELTO) 20 MG TABS tablet Take 20 mg by mouth daily.    . temazepam (RESTORIL) 30 MG capsule Take 1 capsule (30 mg total) by mouth  at bedtime as needed for sleep.   Marland Kitchen timolol (BETIMOL) 0.5 % ophthalmic solution Place 1 drop into both eyes 2 (two) times daily.    Marland Kitchen torsemide (DEMADEX) 20 MG tablet Take 2 tablets by mouth once daily   .  UNABLE TO FIND House Supplement - Take 4 oz by mouth 2 times daily    No facility-administered encounter medications on file as of 03/27/2017.      SIGNIFICANT DIAGNOSTIC EXAMS  PREVIOUS  01-20-16; chest x-ray: 1. Cardiomegaly and mild interstitial edema. 2. Improved aeration.  01-21-16: ct of abdomen and pelvis: 1. Apparent wall thickening at the rectum could reflect mild proctitis. 2. Small bilateral pleural effusions noted. Bibasilar airspace opacities may reflect atelectasis or possibly infection. 3. Large right-sided hiatal hernia, containing most of the stomach. 4. Mild soft tissue inflammation suggested about the bladder. Would correlate for any evidence of cystitis. 5. Nonspecific soft tissue inflammation suggested about the transverse colon. 6. Mild inflammation about the gallbladder is nonspecific, given the underlying diffuse soft tissue inflammation about the abdomen. 7. Very large anterior abdominal wall defect again noted, with packing material and underlying soft tissue thickening. 8. Scattered diverticulosis along the distal sigmoid colon, without evidence of diverticulitis. 9. Scattered aortic atherosclerosis noted. 10. Right convex thoracolumbar scoliosis noted.   01-21-16 ct of neck: 1. Findings consistent with right parotiditis. No abscess identified. No significant lymphadenopathy. 2. Partially visualized small right pleural effusion and dependent right upper lobe opacity which may represent associated pneumonia or atelectasis.  07-03-16: right hip x-ray:No evidence of fracture or dislocation.   07-03-16: lumbar spine x-ray: No evidence of fracture or subluxation along the lumbar spine. Right convex thoracolumbar scoliosis again noted, with underlying degenerative  change.  NO NEW EXAMS   LABS REVIEWED:PREVIOUS    05-30-16: glucose 112; bun 12; creat 0.6; k+ 4.3 ;na++ 145 08-07-16: wbc 11.0; hgb 10-28-16: glucose 97 bun 15.2; creat 0.46; k+ 4.3; na++ 132; ca 9.3; liver normal albumin 3.4  11-28-16:glucose 83; bun 11.0; creat 0.65; k+ 4.2; na++ 133; ca 9.7  NO NEW LABS      Review of Systems  Constitutional: Negative for chills and malaise/fatigue.  HENT: Negative for congestion.   Eyes: Negative for discharge.  Respiratory: Positive for cough, sputum production and shortness of breath.   Cardiovascular: Negative for chest pain, palpitations and leg swelling.  Gastrointestinal: Negative for abdominal pain, constipation and heartburn.  Musculoskeletal: Negative for back pain, joint pain and myalgias.  Skin: Negative.   Neurological: Negative for dizziness.  Psychiatric/Behavioral: The patient is not nervous/anxious.     Physical Exam  Constitutional: She is oriented to person, place, and time. No distress.  Frail   Neck: No thyromegaly present.  Cardiovascular:  Murmur heard. 1/6  Pulmonary/Chest: Effort normal.  Coarse lower lobe sounds bilaterally   Abdominal: Soft. Bowel sounds are normal. She exhibits no distension. There is no tenderness.  Musculoskeletal: She exhibits no edema.  Is able to move upper extremities   Lymphadenopathy:    She has no cervical adenopathy.  Neurological: She is alert and oriented to person, place, and time.  Skin: Skin is warm and dry. She is not diaphoretic.  Abdominal wound; 5.5 x 12.0 x nm cm    Psychiatric: She has a normal mood and affect.        ASSESSMENT/ PLAN:  TODAY:   1. Acute bronchitis: will begin zithromax 500 mg daiy for 7 days with probiotic. Will increase her mucinex to twice daily and will begin tesselon 100 mg every 8 hours for 5 days then every 8 hours as needed and will monitor    MD is aware of resident's narcotic use and is in agreement with current plan of care. We will  attempt to wean resident  as apropriate     Ok Edwards NP [facility]  Contact [phone] Monday through Friday 8am- 5pm  After hours call [phone]

## 2017-03-29 ENCOUNTER — Other Ambulatory Visit: Payer: Self-pay

## 2017-03-29 DIAGNOSIS — M545 Low back pain: Secondary | ICD-10-CM

## 2017-03-29 DIAGNOSIS — G8929 Other chronic pain: Secondary | ICD-10-CM

## 2017-03-29 DIAGNOSIS — G894 Chronic pain syndrome: Secondary | ICD-10-CM

## 2017-03-29 MED ORDER — METHADONE HCL 5 MG PO TABS: 5.0000 mg | ORAL_TABLET | Freq: Every day | ORAL | 0 refills | Status: DC

## 2017-03-29 MED ORDER — TEMAZEPAM 30 MG PO CAPS: 30.0000 mg | ORAL_CAPSULE | Freq: Every day | ORAL | 0 refills | Status: DC

## 2017-03-29 NOTE — Telephone Encounter (Signed)
RX faxed to [facility] @ [phone], phone number [phone] 

## 2017-03-30 ENCOUNTER — Non-Acute Institutional Stay (SKILLED_NURSING_FACILITY): Payer: PPO | Admitting: Adult Health

## 2017-03-30 ENCOUNTER — Other Ambulatory Visit: Payer: Self-pay

## 2017-03-30 ENCOUNTER — Encounter: Payer: Self-pay | Admitting: Adult Health

## 2017-03-30 DIAGNOSIS — G8929 Other chronic pain: Secondary | ICD-10-CM | POA: Diagnosis not present

## 2017-03-30 DIAGNOSIS — G894 Chronic pain syndrome: Secondary | ICD-10-CM | POA: Diagnosis not present

## 2017-03-30 DIAGNOSIS — M545 Low back pain: Secondary | ICD-10-CM | POA: Diagnosis not present

## 2017-03-30 MED ORDER — OXYCODONE HCL 5 MG PO TABS: 5.0000 mg | ORAL_TABLET | ORAL | 0 refills | Status: DC | PRN

## 2017-03-30 NOTE — Progress Notes (Signed)
Location:   [place] Room Number: 105 A Place of Service:  SNF (31)   CODE STATUS: DNR  Allergies  Allergen Reactions  . Tape Other (See Comments)    SKIN IS VERY THIN; IT BRUISES AND TEARS EASILY!! -THX    Chief Complaint  Patient presents with  . Acute Visit    Back Pain    HPI:  She is complaining of poorly managed back pain. She has a long term history of back pain. She states that her lower and mid back hurt. She feels as though she is not receiving her as needed pain medication in a timely fashion. We did discuss the time interval for her pain management. She is wanting the pain medication on a more frequent basis. She also wants to stop her miralax.   Past Medical History:  Diagnosis Date  . Anemia   . Arthritis   . CHF (congestive heart failure) ([place])   . Depression   . GERD (gastroesophageal reflux disease)   . Hyponatremia 01/2016  . Perforated chronic gastric ulcer ([place]) 08/11/2014  . Pneumonia   . Pulmonary arterial hypertension ([place]) 11/28/2014    Past Surgical History:  Procedure Laterality Date  . ABDOMINAL HYSTERECTOMY    . ABDOMINAL SURGERY    . BACK SURGERY  2000  . COLON SURGERY     x2-blockage  . COLONOSCOPY    . ERCP    . HERNIA REPAIR  2012   x2 with mesh  . JOINT REPLACEMENT Left    shoulder  . LAPAROTOMY N/A 08/04/2014   Procedure: EXPLORATORY LAPAROTOMY, REPAIR OF PERFORATED ULCER;  Surgeon: Excell Seltzer, MD;  Location: [facility];  Service: General;  Laterality: N/A;  . LAPAROTOMY N/A 12/21/2015   Procedure: EXPLORATORY LAPAROTOMY/ REMOVAL OF MESH;  Surgeon: Coralie Keens, MD;  Location: [facility];  Service: General;  Laterality: N/A;  . ORIF WRIST FRACTURE  2012   left  . REVERSE SHOULDER ARTHROPLASTY Right 12/04/2013  . REVERSE SHOULDER ARTHROPLASTY  12/04/2013   Procedure: REVERSE SHOULDER ARTHROPLASTY;  Surgeon: Nita Sells, MD;  Location: [facility] OR;  Service: Orthopedics;;  Right reverse total shoulder  arthroplasty  . ROTATOR CUFF REPAIR     dr Tamera Punt  . SHOULDER ARTHROSCOPY  2012   lt and rt  . TENDON REPAIR Right 04/13/2014   Procedure: RIGHT HAND CENTRAL TENDON CENTRALIZATION OF LONG AND RING FINGERS;  Surgeon: Jolyn Nap, MD;  Location: [facility];  Service: Orthopedics;  Laterality: Right;    Social History   Socioeconomic History  . Marital status: Married    Spouse name: Not on file  . Number of children: Not on file  . Years of education: Not on file  . Highest education level: Not on file  Social Needs  . Financial resource strain: Not on file  . Food insecurity - worry: Not on file  . Food insecurity - inability: Not on file  . Transportation needs - medical: Not on file  . Transportation needs - non-medical: Not on file  Occupational History  . Not on file  Tobacco Use  . Smoking status: Never Smoker  . Smokeless tobacco: Never Used  Substance and Sexual Activity  . Alcohol use: Yes    Alcohol/week: 4.2 oz    Types: 7 Glasses of wine per week    Comment: 8 oz -12 oz of wine every   . Drug use: No  . Sexual activity: Not on file  Other Topics Concern  .  Not on file  Social History Narrative  . Not on file   Family History  Problem Relation Age of Onset  . Stroke Mother   . Breast cancer Sister   . Ulcerative colitis Daughter   . Chronic fatigue Daughter   . Chronic fatigue Daughter       VITAL SIGNS BP 130/62   Pulse 68   Temp 98 F (36.7 C)   Resp 20   Ht 5\' 3"  (1.6 m)   Wt 176 lb 9.6 oz (80.1 kg)   SpO2 98%   BMI 31.28 kg/m    Outpatient Encounter Medications as of 03/30/2017  Medication Sig Note  . acetaminophen (TYLENOL) 325 MG tablet Take 325 mg by mouth every 6 (six) hours as needed for mild pain.    . Amino Acids-Protein Hydrolys (FEEDING SUPPLEMENT, PRO-STAT SUGAR FREE 64,) LIQD Take 30 mLs by mouth 3 (three) times daily with meals.   Marland Kitchen azithromycin (ZITHROMAX) 500 MG tablet Take 500 mg by mouth. Every  afternoon x 5 days   . benzonatate (TESSALON) 100 MG capsule Take 100 mg by mouth 3 (three) times daily as needed for cough. X 5 days then 1 capsule by mouth every 8 hours as needed   . bisacodyl (DULCOLAX) 10 MG suppository Place 1 suppository (10 mg total) rectally daily as needed for moderate constipation (May repeat times one).   Marland Kitchen desonide (DESOWEN) 0.05 % cream Apply to face topically as needed for rash/redness two times daily as needed   . diclofenac sodium (VOLTAREN) 1 % GEL Apply 1 g topically 2 (two) times daily. Apply to bilateral knees for pain   . divalproex (DEPAKOTE SPRINKLE) 125 MG capsule Give 4 Capsules (500 mg) by mouth at bedtime   . DULoxetine (CYMBALTA) 60 MG capsule Take 60 mg by mouth at bedtime.    . fluorometholone (FML) 0.1 % ophthalmic suspension Place 1 drop into both eyes 3 (three) times daily.   . fluticasone (FLONASE) 50 MCG/ACT nasal spray Place 1 spray into both nostrils at bedtime.   . gabapentin (NEURONTIN) 300 MG capsule Take 300 mg by mouth 2 (two) times daily.   Marland Kitchen guaiFENesin (MUCINEX) 600 MG 12 hr tablet Take 600 mg by mouth 2 (two) times daily.    Marland Kitchen Ketotifen Fumarate (ZADITOR OP) Instill 1 drop in both eyes two times daily for Allergy   . loratadine (CLARITIN) 10 MG tablet Take 10 mg by mouth daily.   . magnesium hydroxide (MILK OF MAGNESIA) 400 MG/5ML suspension Take 60 mLs by mouth every other day. AS NEEDED FOR CONSTIPATION 08/04/2014: .   . methadone (DOLOPHINE) 5 MG tablet Take 1 tablet (5 mg total) by mouth at bedtime.   . methocarbamol (ROBAXIN) 500 MG tablet Take 500 mg by mouth every 6 (six) hours as needed.    . Multiple Vitamin (MULTIVITAMIN) tablet Take 1 tablet by mouth 3 (three) times daily.    . Omega-3 Fatty Acids (FISH OIL) 1000 MG CPDR Take 1,000 mg by mouth daily.    Marland Kitchen omeprazole (PRILOSEC) 20 MG capsule Take 20 mg by mouth daily.   Marland Kitchen oxyCODONE (ROXICODONE) 5 MG immediate release tablet Take 1 tablet (5 mg total) by mouth every eight  hours as needed for severe pain.   . OXYGEN Inhale into the lungs. 2-6 L/min to keep stat at 90 or above   . polyethylene glycol (MIRALAX / GLYCOLAX) packet Take 17 g by mouth 2 (two) times daily as needed for mild constipation.    Marland Kitchen  rivaroxaban (XARELTO) 20 MG TABS tablet Take 20 mg by mouth daily.    . temazepam (RESTORIL) 30 MG capsule Take 1 capsule (30 mg total) by mouth at bedtime.   . timolol (BETIMOL) 0.5 % ophthalmic solution Place 1 drop into both eyes 2 (two) times daily.    Marland Kitchen torsemide (DEMADEX) 20 MG tablet Take 2 tablets by mouth once daily   . UNABLE TO FIND House Supplement - Take 4 oz by mouth 2 times daily    No facility-administered encounter medications on file as of 03/30/2017.      SIGNIFICANT DIAGNOSTIC EXAMS   PREVIOUS  01-20-16; chest x-ray: 1. Cardiomegaly and mild interstitial edema. 2. Improved aeration.  01-21-16: ct of abdomen and pelvis: 1. Apparent wall thickening at the rectum could reflect mild proctitis. 2. Small bilateral pleural effusions noted. Bibasilar airspace opacities may reflect atelectasis or possibly infection. 3. Large right-sided hiatal hernia, containing most of the stomach. 4. Mild soft tissue inflammation suggested about the bladder. Would correlate for any evidence of cystitis. 5. Nonspecific soft tissue inflammation suggested about the transverse colon. 6. Mild inflammation about the gallbladder is nonspecific, given the underlying diffuse soft tissue inflammation about the abdomen. 7. Very large anterior abdominal wall defect again noted, with packing material and underlying soft tissue thickening. 8. Scattered diverticulosis along the distal sigmoid colon, without evidence of diverticulitis. 9. Scattered aortic atherosclerosis noted. 10. Right convex thoracolumbar scoliosis noted.   01-21-16 ct of neck: 1. Findings consistent with right parotiditis. No abscess identified. No significant lymphadenopathy. 2. Partially visualized small  right pleural effusion and dependent right upper lobe opacity which may represent associated pneumonia or atelectasis.  07-03-16: right hip x-ray:No evidence of fracture or dislocation.   07-03-16: lumbar spine x-ray: No evidence of fracture or subluxation along the lumbar spine. Right convex thoracolumbar scoliosis again noted, with underlying degenerative change.  NO NEW EXAMS   LABS REVIEWED:PREVIOUS    05-30-16: glucose 112; bun 12; creat 0.6; k+ 4.3 ;na++ 145 08-07-16: wbc 11.0; hgb 10-28-16: glucose 97 bun 15.2; creat 0.46; k+ 4.3; na++ 132; ca 9.3; liver normal albumin 3.4  11-28-16:glucose 83; bun 11.0; creat 0.65; k+ 4.2; na++ 133; ca 9.7  NO NEW LABS    .   Review of Systems  Constitutional: Negative for malaise/fatigue.  Respiratory: Negative for cough and shortness of breath.   Cardiovascular: Negative for chest pain, palpitations and leg swelling.  Gastrointestinal: Negative for abdominal pain, constipation and heartburn.  Musculoskeletal: Positive for back pain. Negative for joint pain and myalgias.  Skin: Negative.   Neurological: Negative for dizziness.  Psychiatric/Behavioral: The patient is not nervous/anxious.     Physical Exam  Constitutional: She is oriented to person, place, and time. No distress.  Frail   Neck: No thyromegaly present.  Cardiovascular: Normal rate, regular rhythm and intact distal pulses.  Murmur heard. 1/6  Pulmonary/Chest: Effort normal and breath sounds normal. No respiratory distress.  Abdominal: Soft. Bowel sounds are normal. She exhibits no distension.  Musculoskeletal: She exhibits no edema.  Is able to move upper extremities  Does have tenderness to palpation to her lower back and mid back   Lymphadenopathy:    She has no cervical adenopathy.  Neurological: She is alert and oriented to person, place, and time.  Skin: Skin is warm and dry. She is not diaphoretic.  Abdominal wound; 5.5 x 12.0 x nm cm   Psychiatric: She has a normal  mood and affect.    ASSESSMENT/ PLAN:  TODAY:   1. Chronic pain syndrome: has chronic bilateral low back pain:  Is stable  is on chronic narcotic therapy: will continue methadone 5 mg NIGHTLY robaxin 500 mg four  times daily as needed; neurontin 300 mg twice daily   voltaren gel 1 gm twice daily to knees  Will change her oxycodone to 5 mg every 4 hours as needed and will five her an extra 5 mg one time now.         MD is aware of resident's narcotic use and is in agreement with current plan of care. We will attempt to wean resident as apropriate   Ok Edwards NP [facility]  Contact [phone] Monday through Friday 8am- 5pm  After hours call [phone]

## 2017-03-30 NOTE — Telephone Encounter (Signed)
RX faxed to [facility] @ [phone], phone number [phone] 

## 2017-04-09 DIAGNOSIS — J209 Acute bronchitis, unspecified: Secondary | ICD-10-CM | POA: Insufficient documentation

## 2017-04-10 DIAGNOSIS — L98499 Non-pressure chronic ulcer of skin of other sites with unspecified severity: Secondary | ICD-10-CM | POA: Diagnosis not present

## 2017-04-17 DIAGNOSIS — M549 Dorsalgia, unspecified: Secondary | ICD-10-CM | POA: Diagnosis not present

## 2017-04-17 DIAGNOSIS — M170 Bilateral primary osteoarthritis of knee: Secondary | ICD-10-CM | POA: Diagnosis not present

## 2017-04-17 DIAGNOSIS — L98499 Non-pressure chronic ulcer of skin of other sites with unspecified severity: Secondary | ICD-10-CM | POA: Diagnosis not present

## 2017-04-17 DIAGNOSIS — M533 Sacrococcygeal disorders, not elsewhere classified: Secondary | ICD-10-CM | POA: Diagnosis not present

## 2017-04-17 DIAGNOSIS — R4182 Altered mental status, unspecified: Secondary | ICD-10-CM | POA: Diagnosis not present

## 2017-04-20 DIAGNOSIS — T8189XS Other complications of procedures, not elsewhere classified, sequela: Secondary | ICD-10-CM | POA: Diagnosis not present

## 2017-04-20 DIAGNOSIS — S31102A Unspecified open wound of abdominal wall, epigastric region without penetration into peritoneal cavity, initial encounter: Secondary | ICD-10-CM | POA: Diagnosis not present

## 2017-04-20 DIAGNOSIS — R100 Acute abdomen: Secondary | ICD-10-CM | POA: Diagnosis not present

## 2017-04-24 ENCOUNTER — Encounter: Payer: Self-pay | Admitting: Adult Health

## 2017-04-24 ENCOUNTER — Non-Acute Institutional Stay (SKILLED_NURSING_FACILITY): Payer: PPO | Admitting: Adult Health

## 2017-04-24 DIAGNOSIS — G8929 Other chronic pain: Secondary | ICD-10-CM | POA: Diagnosis not present

## 2017-04-24 DIAGNOSIS — L98499 Non-pressure chronic ulcer of skin of other sites with unspecified severity: Secondary | ICD-10-CM | POA: Diagnosis not present

## 2017-04-24 DIAGNOSIS — J302 Other seasonal allergic rhinitis: Secondary | ICD-10-CM | POA: Diagnosis not present

## 2017-04-24 DIAGNOSIS — M545 Low back pain, unspecified: Secondary | ICD-10-CM

## 2017-04-24 DIAGNOSIS — G894 Chronic pain syndrome: Secondary | ICD-10-CM

## 2017-04-24 DIAGNOSIS — I2721 Secondary pulmonary arterial hypertension: Secondary | ICD-10-CM

## 2017-04-24 NOTE — Progress Notes (Signed)
Location:   [place] Room Number: 105 Place of Service:  SNF (31)   CODE STATUS: dnr  Allergies  Allergen Reactions  . Tape Other (See Comments)    SKIN IS VERY THIN; IT BRUISES AND TEARS EASILY!! -THX    Chief Complaint  Patient presents with  . Medical Management of Chronic Issues    Pulmonary arterial hypertension; allergic rhinitis; chronic pain syndrome and back pain.     HPI:  She is a 84 year old long term resident of this facility being seen for the management of her chronic illnesses: pulmonary hypertension; allergic rhinitis; chronic pain. She is not complaining of pain. She states that her pain is being adequately managed. She denies any changes in appetite. She states that she is sleeping ok at night. There are no nursing concerns at this time.   Past Medical History:  Diagnosis Date  . Anemia   . Arthritis   . CHF (congestive heart failure) ([place])   . Depression   . GERD (gastroesophageal reflux disease)   . Hyponatremia 01/2016  . Perforated chronic gastric ulcer ([place]) 08/11/2014  . Pneumonia   . Pulmonary arterial hypertension ([place]) 11/28/2014    Past Surgical History:  Procedure Laterality Date  . ABDOMINAL HYSTERECTOMY    . ABDOMINAL SURGERY    . BACK SURGERY  2000  . COLON SURGERY     x2-blockage  . COLONOSCOPY    . ERCP    . HERNIA REPAIR  2012   x2 with mesh  . JOINT REPLACEMENT Left    shoulder  . LAPAROTOMY N/A 08/04/2014   Procedure: EXPLORATORY LAPAROTOMY, REPAIR OF PERFORATED ULCER;  Surgeon: Excell Seltzer, MD;  Location: [facility];  Service: General;  Laterality: N/A;  . LAPAROTOMY N/A 12/21/2015   Procedure: EXPLORATORY LAPAROTOMY/ REMOVAL OF MESH;  Surgeon: Coralie Keens, MD;  Location: [facility];  Service: General;  Laterality: N/A;  . ORIF WRIST FRACTURE  2012   left  . REVERSE SHOULDER ARTHROPLASTY Right 12/04/2013  . REVERSE SHOULDER ARTHROPLASTY  12/04/2013   Procedure: REVERSE SHOULDER ARTHROPLASTY;  Surgeon:  Nita Sells, MD;  Location: [facility] OR;  Service: Orthopedics;;  Right reverse total shoulder arthroplasty  . ROTATOR CUFF REPAIR     dr Tamera Punt  . SHOULDER ARTHROSCOPY  2012   lt and rt  . TENDON REPAIR Right 04/13/2014   Procedure: RIGHT HAND CENTRAL TENDON CENTRALIZATION OF LONG AND RING FINGERS;  Surgeon: Jolyn Nap, MD;  Location: [place];  Service: Orthopedics;  Laterality: Right;    Social History   Socioeconomic History  . Marital status: Married    Spouse name: Not on file  . Number of children: Not on file  . Years of education: Not on file  . Highest education level: Not on file  Social Needs  . Financial resource strain: Not on file  . Food insecurity - worry: Not on file  . Food insecurity - inability: Not on file  . Transportation needs - medical: Not on file  . Transportation needs - non-medical: Not on file  Occupational History  . Not on file  Tobacco Use  . Smoking status: Never Smoker  . Smokeless tobacco: Never Used  Substance and Sexual Activity  . Alcohol use: Yes    Alcohol/week: 4.2 oz    Types: 7 Glasses of wine per week    Comment: 8 oz -12 oz of wine every   . Drug use: No  .  Sexual activity: Not on file  Other Topics Concern  . Not on file  Social History Narrative  . Not on file   Family History  Problem Relation Age of Onset  . Stroke Mother   . Breast cancer Sister   . Ulcerative colitis Daughter   . Chronic fatigue Daughter   . Chronic fatigue Daughter       VITAL SIGNS BP 125/69   Pulse 70   Temp 98.1 F (36.7 C)   Resp 16   Ht 5\' 3"  (1.6 m)   Wt 176 lb (79.8 kg)   SpO2 96%   BMI 31.18 kg/m   Outpatient Encounter Medications as of 04/24/2017  Medication Sig Note  . acetaminophen (TYLENOL) 325 MG tablet Take 325 mg by mouth every 6 (six) hours as needed for mild pain.    . Amino Acids-Protein Hydrolys (FEEDING SUPPLEMENT, PRO-STAT SUGAR FREE 64,) LIQD Take 30 mLs by mouth 3 (three) times  daily with meals.   . benzonatate (TESSALON) 100 MG capsule Take 100 mg by mouth 3 (three) times daily as needed for cough. X 5 days then 1 capsule by mouth every 8 hours as needed   . bisacodyl (DULCOLAX) 10 MG suppository Place 1 suppository (10 mg total) rectally daily as needed for moderate constipation (May repeat times one).   Marland Kitchen desonide (DESOWEN) 0.05 % cream Apply to face topically as needed for rash/redness two times daily as needed   . diclofenac sodium (VOLTAREN) 1 % GEL Apply 1 g topically 2 (two) times daily. Apply to bilateral knees for pain   . divalproex (DEPAKOTE SPRINKLE) 125 MG capsule Give 4 Capsules (500 mg) by mouth at bedtime   . DULoxetine (CYMBALTA) 60 MG capsule Take 60 mg by mouth at bedtime.    . fluorometholone (FML) 0.1 % ophthalmic suspension Place 1 drop into both eyes 3 (three) times daily.   . fluticasone (FLONASE) 50 MCG/ACT nasal spray Place 1 spray into both nostrils at bedtime.   . gabapentin (NEURONTIN) 300 MG capsule Take 300 mg by mouth 2 (two) times daily.   Marland Kitchen guaiFENesin (MUCINEX) 600 MG 12 hr tablet Take 600 mg by mouth 2 (two) times daily.    Marland Kitchen ketotifen (ZADITOR) 0.025 % ophthalmic solution Place 1 drop into both eyes 2 (two) times daily as needed.   . loratadine (CLARITIN) 10 MG tablet Take 10 mg by mouth daily.   . magnesium hydroxide (MILK OF MAGNESIA) 400 MG/5ML suspension Take 60 mLs by mouth every other day. AS NEEDED FOR CONSTIPATION 08/04/2014: .   . methadone (DOLOPHINE) 5 MG tablet Take 1 tablet (5 mg total) by mouth at bedtime.   . methocarbamol (ROBAXIN) 500 MG tablet Take 500 mg by mouth every 6 (six) hours as needed.    . Multiple Vitamin (MULTIVITAMIN) tablet Take 1 tablet by mouth 3 (three) times daily.    . Omega-3 Fatty Acids (FISH OIL) 1000 MG CPDR Take 1,000 mg by mouth daily.    Marland Kitchen omeprazole (PRILOSEC) 20 MG capsule Take 20 mg by mouth daily.   Marland Kitchen oxyCODONE (ROXICODONE) 5 MG immediate release tablet Take 1 tablet (5 mg total) by  mouth every 4 (four) hours as needed for severe pain.   . OXYGEN Inhale into the lungs. 2-6 L/min to keep stat at 90 or above   . polyethylene glycol (MIRALAX / GLYCOLAX) packet Take 17 g by mouth 2 (two) times daily as needed for mild constipation.    . rivaroxaban (XARELTO) 20  MG TABS tablet Take 20 mg by mouth daily.    . temazepam (RESTORIL) 30 MG capsule Take 1 capsule (30 mg total) by mouth at bedtime.   . timolol (BETIMOL) 0.5 % ophthalmic solution Place 1 drop into both eyes 2 (two) times daily.    Marland Kitchen torsemide (DEMADEX) 20 MG tablet Take 2 tablets by mouth once daily   . UNABLE TO FIND House Supplement - Take 4 oz by mouth 2 times daily   . [DISCONTINUED] azithromycin (ZITHROMAX) 500 MG tablet Take 500 mg by mouth. Every afternoon x 5 days   . [DISCONTINUED] Ketotifen Fumarate (ZADITOR OP) Instill 1 drop in both eyes two times daily for Allergy    No facility-administered encounter medications on file as of 04/24/2017.      SIGNIFICANT DIAGNOSTIC EXAMS   PREVIOUS  01-20-16; chest x-ray: 1. Cardiomegaly and mild interstitial edema. 2. Improved aeration.  01-21-16: ct of abdomen and pelvis: 1. Apparent wall thickening at the rectum could reflect mild proctitis. 2. Small bilateral pleural effusions noted. Bibasilar airspace opacities may reflect atelectasis or possibly infection. 3. Large right-sided hiatal hernia, containing most of the stomach. 4. Mild soft tissue inflammation suggested about the bladder. Would correlate for any evidence of cystitis. 5. Nonspecific soft tissue inflammation suggested about the transverse colon. 6. Mild inflammation about the gallbladder is nonspecific, given the underlying diffuse soft tissue inflammation about the abdomen. 7. Very large anterior abdominal wall defect again noted, with packing material and underlying soft tissue thickening. 8. Scattered diverticulosis along the distal sigmoid colon, without evidence of diverticulitis. 9. Scattered  aortic atherosclerosis noted. 10. Right convex thoracolumbar scoliosis noted.   01-21-16 ct of neck: 1. Findings consistent with right parotiditis. No abscess identified. No significant lymphadenopathy. 2. Partially visualized small right pleural effusion and dependent right upper lobe opacity which may represent associated pneumonia or atelectasis.  07-03-16: right hip x-ray:No evidence of fracture or dislocation.   07-03-16: lumbar spine x-ray: No evidence of fracture or subluxation along the lumbar spine. Right convex thoracolumbar scoliosis again noted, with underlying degenerative change.  NO NEW EXAMS   LABS REVIEWED:PREVIOUS    05-30-16: glucose 112; bun 12; creat 0.6; k+ 4.3 ;na++ 145 08-07-16: wbc 11.0; hgb 10-28-16: glucose 97 bun 15.2; creat 0.46; k+ 4.3; na++ 132; ca 9.3; liver normal albumin 3.4  11-28-16:glucose 83; bun 11.0; creat 0.65; k+ 4.2; na++ 133; ca 9.7  NO NEW LABS     Review of Systems  Constitutional: Negative for malaise/fatigue.  Respiratory: Negative for cough and shortness of breath.   Cardiovascular: Negative for chest pain, palpitations and leg swelling.  Gastrointestinal: Negative for abdominal pain, constipation and heartburn.  Musculoskeletal: Negative for back pain, joint pain and myalgias.  Skin: Negative.   Neurological: Negative for dizziness.  Psychiatric/Behavioral: The patient is not nervous/anxious.       Physical Exam  Constitutional: She is oriented to person, place, and time. No distress.  Frail   Neck: No thyromegaly present.  Cardiovascular: Normal rate, regular rhythm and intact distal pulses.  Murmur heard. 1/6  Pulmonary/Chest: Effort normal and breath sounds normal. No respiratory distress.  Abdominal: Soft. Bowel sounds are normal. She exhibits no distension. There is no tenderness.  Musculoskeletal: She exhibits no edema.  Is able to move upper extremities   Lymphadenopathy:    She has no cervical adenopathy.  Neurological:  She is alert and oriented to person, place, and time.  Skin: Skin is warm and dry. She is not diaphoretic.  Abdominal wound; 5.5 x 12.0 x nm cm   Left 4th toe nail with old blood present   Psychiatric: She has a normal mood and affect.     ASSESSMENT/ PLAN:  TODAY:   1. Seasonal allergic rhinitis: is stable  will continue flonase daily claritin 10 mg daily  and mucinex twice  daily   2. Pulmonary arterial hypertension: stable  b/p 125/69 is currently not on medications;  no changes will monitor   3. Chronic pain syndrome: has chronic bilateral low back pain without sciatica:  Is stable  is on chronic narcotic therapy: will continue methadone 5 mg NIGHTLY robaxin 500 mg four  times daily as needed; neurontin 300 mg twice daily   voltaren gel 1 gm twice daily to knees oxycodone 5 every 4 hours as needed   4. Constipation: stable  will continue MOM 30 cc every other day as needed;  miralax  17 gm daily will monitor    PREVIOUS:    5.  Chronic surgical wound due to Status post laparotomy without  removal of mesh: is slowly improving  has large abdominal wound: will continue current treatment is followed by wound doctor   6. Insomnia: is stable will continue  restoril  30 mg nightly she has not tolerated lower doses    7. Bilateral GHW:EXHBZJ  femoral veins:is stable  will continue xarelto 20 mg daily    8. CHF:EF 55-60% (06-21-14) is stable will continue demadex 40 mg daily with k+ 20 meq daily is  02 dependent   9. Gerd: stable  will continue prilosec 20 mg daily   10. Depression with anxiety:is stable  will continue cymbalta 60 mg daily takes depakote 500 mg nightly to stabilize mood. GDR: She is emotionally stable; lower her medications would cause her emotional harm.   11. Glaucoma: is without change  will continue timolol to both eyes twice daily fluorometholone 1 drop both eyes three times daily zaditor 1 drop both eyes twice daily as needed    MD is aware of resident's narcotic  use and is in agreement with current plan of care. We will attempt to wean resident as apropriate   Ok Edwards NP [facility]  Contact [phone] Monday through Friday 8am- 5pm  After hours call [phone]

## 2017-05-01 DIAGNOSIS — L98499 Non-pressure chronic ulcer of skin of other sites with unspecified severity: Secondary | ICD-10-CM | POA: Diagnosis not present

## 2017-05-08 DIAGNOSIS — L98499 Non-pressure chronic ulcer of skin of other sites with unspecified severity: Secondary | ICD-10-CM | POA: Diagnosis not present

## 2017-05-15 DIAGNOSIS — L98499 Non-pressure chronic ulcer of skin of other sites with unspecified severity: Secondary | ICD-10-CM | POA: Diagnosis not present

## 2017-05-18 ENCOUNTER — Non-Acute Institutional Stay (SKILLED_NURSING_FACILITY): Payer: PPO | Admitting: Adult Health

## 2017-05-18 DIAGNOSIS — T8189XD Other complications of procedures, not elsewhere classified, subsequent encounter: Secondary | ICD-10-CM

## 2017-05-18 DIAGNOSIS — G4700 Insomnia, unspecified: Secondary | ICD-10-CM | POA: Diagnosis not present

## 2017-05-18 DIAGNOSIS — I82413 Acute embolism and thrombosis of femoral vein, bilateral: Secondary | ICD-10-CM

## 2017-05-18 DIAGNOSIS — I509 Heart failure, unspecified: Secondary | ICD-10-CM

## 2017-05-19 ENCOUNTER — Encounter: Payer: Self-pay | Admitting: Adult Health

## 2017-05-19 ENCOUNTER — Encounter: Payer: Self-pay | Admitting: Internal Medicine

## 2017-05-19 NOTE — Progress Notes (Signed)
Location:   [place] Room Number: 105 Place of Service:  SNF (31)   CODE STATUS: dnr  Allergies  Allergen Reactions  . Tape Other (See Comments)    SKIN IS VERY THIN; IT BRUISES AND TEARS EASILY!! -THX    Chief Complaint  Patient presents with  . Medical Management of Chronic Issues    Dvt; chf; insomnia; nonhealing surgical wound     HPI:  She is an 84 year old long term resident of this facility being seen for the management of chronic illnesses: dvt; chf; insomnia; abdominal wound. She is complaining of excessive hunger and weight gain. She denies any uncontrolled pain; and denies insomnia. There are no nursing concerns at this time.   Past Medical History:  Diagnosis Date  . Anemia   . Arthritis   . CHF (congestive heart failure) ([place])   . Depression   . GERD (gastroesophageal reflux disease)   . Hyponatremia 01/2016  . Perforated chronic gastric ulcer ([place]) 08/11/2014  . Pneumonia   . Pulmonary arterial hypertension ([place]) 11/28/2014    Past Surgical History:  Procedure Laterality Date  . ABDOMINAL HYSTERECTOMY    . ABDOMINAL SURGERY    . BACK SURGERY  2000  . COLON SURGERY     x2-blockage  . COLONOSCOPY    . ERCP    . HERNIA REPAIR  2012   x2 with mesh  . JOINT REPLACEMENT Left    shoulder  . LAPAROTOMY N/A 08/04/2014   Procedure: EXPLORATORY LAPAROTOMY, REPAIR OF PERFORATED ULCER;  Surgeon: Excell Seltzer, MD;  Location: [facility];  Service: General;  Laterality: N/A;  . LAPAROTOMY N/A 12/21/2015   Procedure: EXPLORATORY LAPAROTOMY/ REMOVAL OF MESH;  Surgeon: Coralie Keens, MD;  Location: [facility];  Service: General;  Laterality: N/A;  . ORIF WRIST FRACTURE  2012   left  . REVERSE SHOULDER ARTHROPLASTY Right 12/04/2013  . REVERSE SHOULDER ARTHROPLASTY  12/04/2013   Procedure: REVERSE SHOULDER ARTHROPLASTY;  Surgeon: Nita Sells, MD;  [facility]: [facility] OR;  Service: Orthopedics;;  Right reverse total shoulder arthroplasty  .  ROTATOR CUFF REPAIR     dr Tamera Punt  . SHOULDER ARTHROSCOPY  2012   lt and rt  . TENDON REPAIR Right 04/13/2014   Procedure: RIGHT HAND CENTRAL TENDON CENTRALIZATION OF LONG AND RING FINGERS;  Surgeon: Jolyn Nap, MD;  Location: [place];  Service: Orthopedics;  Laterality: Right;    Social History   Socioeconomic History  . Marital status: Married    Spouse name: Not on file  . Number of children: Not on file  . Years of education: Not on file  . Highest education level: Not on file  Social Needs  . Financial resource strain: Not on file  . Food insecurity - worry: Not on file  . Food insecurity - inability: Not on file  . Transportation needs - medical: Not on file  . Transportation needs - non-medical: Not on file  Occupational History  . Not on file  Tobacco Use  . Smoking status: Never Smoker  . Smokeless tobacco: Never Used  Substance and Sexual Activity  . Alcohol use: Yes    Alcohol/week: 4.2 oz    Types: 7 Glasses of wine per week    Comment: 8 oz -12 oz of wine every   . Drug use: No  . Sexual activity: Not on file  Other Topics Concern  . Not on file  Social History Narrative  .  Not on file   Family History  Problem Relation Age of Onset  . Stroke Mother   . Breast cancer Sister   . Ulcerative colitis Daughter   . Chronic fatigue Daughter   . Chronic fatigue Daughter       VITAL SIGNS BP 114/68   Pulse 70   Temp 98.7 F (37.1 C)   Resp 20   Ht 5\' 3"  (1.6 m)   Wt 176 lb (79.8 kg) Comment: 01-27-17  SpO2 94%   BMI 31.18 kg/m   Outpatient Encounter Medications as of 05/18/2017  Medication Sig Note  . acetaminophen (TYLENOL) 325 MG tablet Take 325 mg by mouth every 6 (six) hours as needed for mild pain.    . Amino Acids-Protein Hydrolys (FEEDING SUPPLEMENT, PRO-STAT SUGAR FREE 64,) LIQD Take 30 mLs by mouth 3 (three) times daily with meals.   . benzonatate (TESSALON) 100 MG capsule Take 100 mg by mouth 3 (three) times daily  as needed for cough. X 5 days then 1 capsule by mouth every 8 hours as needed   . bisacodyl (DULCOLAX) 10 MG suppository Place 1 suppository (10 mg total) rectally daily as needed for moderate constipation (May repeat times one).   Marland Kitchen desonide (DESOWEN) 0.05 % cream Apply to face topically as needed for rash/redness two times daily as needed   . diclofenac sodium (VOLTAREN) 1 % GEL Apply 1 g topically 2 (two) times daily. Apply to bilateral knees for pain   . divalproex (DEPAKOTE SPRINKLE) 125 MG capsule Give 4 Capsules (500 mg) by mouth at bedtime   . DULoxetine (CYMBALTA) 60 MG capsule Take 60 mg by mouth at bedtime.    . fluorometholone (FML) 0.1 % ophthalmic suspension Place 1 drop into both eyes 3 (three) times daily.   . fluticasone (FLONASE) 50 MCG/ACT nasal spray Place 1 spray into both nostrils at bedtime.   . gabapentin (NEURONTIN) 300 MG capsule Take 300 mg by mouth 2 (two) times daily.   Marland Kitchen guaiFENesin (MUCINEX) 600 MG 12 hr tablet Take 600 mg by mouth 2 (two) times daily.    Marland Kitchen ketotifen (ZADITOR) 0.025 % ophthalmic solution Place 1 drop into both eyes 2 (two) times daily as needed.   . loratadine (CLARITIN) 10 MG tablet Take 10 mg by mouth daily.   . magnesium hydroxide (MILK OF MAGNESIA) 400 MG/5ML suspension Take 60 mLs by mouth every other day. AS NEEDED FOR CONSTIPATION 08/04/2014: .   . methadone (DOLOPHINE) 5 MG tablet Take 1 tablet (5 mg total) by mouth at bedtime.   . methocarbamol (ROBAXIN) 500 MG tablet Take 500 mg by mouth every 6 (six) hours as needed.    . Multiple Vitamin (MULTIVITAMIN) tablet Take 1 tablet by mouth 3 (three) times daily.    . Omega-3 Fatty Acids (FISH OIL) 1000 MG CPDR Take 1,000 mg by mouth daily.    Marland Kitchen omeprazole (PRILOSEC) 20 MG capsule Take 20 mg by mouth daily.   Marland Kitchen oxyCODONE (ROXICODONE) 5 MG immediate release tablet Take 1 tablet (5 mg total) by mouth every 4 (four) hours as needed for severe pain.   . OXYGEN Inhale into the lungs. 2-6 L/min to keep  stat at 90 or above   . polyethylene glycol (MIRALAX / GLYCOLAX) packet Take 17 g by mouth 2 (two) times daily as needed for mild constipation.    . rivaroxaban (XARELTO) 20 MG TABS tablet Take 20 mg by mouth daily.    . temazepam (RESTORIL) 30 MG capsule Take  1 capsule (30 mg total) by mouth at bedtime.   . timolol (BETIMOL) 0.5 % ophthalmic solution Place 1 drop into both eyes 2 (two) times daily.    Marland Kitchen torsemide (DEMADEX) 20 MG tablet Take 2 tablets by mouth once daily   . UNABLE TO FIND House Supplement - Take 4 oz by mouth 2 times daily    No facility-administered encounter medications on file as of 05/18/2017.      SIGNIFICANT DIAGNOSTIC EXAMS  PREVIOUS  01-20-16; chest x-ray: 1. Cardiomegaly and mild interstitial edema. 2. Improved aeration.  01-21-16: ct of abdomen and pelvis: 1. Apparent wall thickening at the rectum could reflect mild proctitis. 2. Small bilateral pleural effusions noted. Bibasilar airspace opacities may reflect atelectasis or possibly infection. 3. Large right-sided hiatal hernia, containing most of the stomach. 4. Mild soft tissue inflammation suggested about the bladder. Would correlate for any evidence of cystitis. 5. Nonspecific soft tissue inflammation suggested about the transverse colon. 6. Mild inflammation about the gallbladder is nonspecific, given the underlying diffuse soft tissue inflammation about the abdomen. 7. Very large anterior abdominal wall defect again noted, with packing material and underlying soft tissue thickening. 8. Scattered diverticulosis along the distal sigmoid colon, without evidence of diverticulitis. 9. Scattered aortic atherosclerosis noted. 10. Right convex thoracolumbar scoliosis noted.   01-21-16 ct of neck: 1. Findings consistent with right parotiditis. No abscess identified. No significant lymphadenopathy. 2. Partially visualized small right pleural effusion and dependent right upper lobe opacity which may represent  associated pneumonia or atelectasis.  07-03-16: right hip x-ray:No evidence of fracture or dislocation.   07-03-16: lumbar spine x-ray: No evidence of fracture or subluxation along the lumbar spine. Right convex thoracolumbar scoliosis again noted, with underlying degenerative change.  NO NEW EXAMS   LABS REVIEWED:PREVIOUS    05-30-16: glucose 112; bun 12; creat 0.6; k+ 4.3 ;na++ 145 08-07-16: wbc 11.0; hgb 10-28-16: glucose 97 bun 15.2; creat 0.46; k+ 4.3; na++ 132; ca 9.3; liver normal albumin 3.4  11-28-16:glucose 83; bun 11.0; creat 0.65; k+ 4.2; na++ 133; ca 9.7  NO NEW LABS     Review of Systems  Constitutional: Negative for malaise/fatigue.  Respiratory: Negative for cough and shortness of breath.   Cardiovascular: Negative for chest pain, palpitations and leg swelling.  Gastrointestinal: Negative for abdominal pain, constipation and heartburn.  Musculoskeletal: Negative for back pain, joint pain and myalgias.  Skin: Negative.   Neurological: Negative for dizziness.  Endo/Heme/Allergies:       Has excessive hunger   Psychiatric/Behavioral: The patient is not nervous/anxious.     Physical Exam  Constitutional: She is oriented to person, place, and time. She appears well-developed and well-nourished.  Neck: No thyromegaly present.  Cardiovascular: Normal rate and regular rhythm.  Murmur heard. Pulmonary/Chest: Effort normal and breath sounds normal. No respiratory distress.  Abdominal: Soft. Bowel sounds are normal. She exhibits no distension. There is no tenderness.  Musculoskeletal: She exhibits no edema.  Is able to move upper extremities   Lymphadenopathy:    She has no cervical adenopathy.  Neurological: She is alert and oriented to person, place, and time.  Skin: Skin is warm and dry. She is not diaphoretic.  Abdomen surgical wound: 4.2 x 13.3 x 0.1 cm  Psychiatric: She has a normal mood and affect.       ASSESSMENT/ PLAN:  TODAY:   1. Surgical wound, non  healing subsequent encounter Status post laparotomy without  removal of mesh: is slowly improving  has large abdominal wound:  will continue current treatment is followed by wound doctor   2. Insomnia: is stable will continue  restoril  30 mg nightly she has not tolerated lower doses    3. Deep vein thrombosis of femoral vein of both lower extremities :is stable  will continue xarelto 20 mg daily    4. CHF, chronic:EF 55-60% (06-21-14) is stable will continue demadex 40 mg daily uses 02 as neededt   PREVIOUS:    5. Gerd: stable  will continue prilosec 20 mg daily   6. Depression with anxiety:is stable  will continue cymbalta 60 mg daily takes depakote 500 mg nightly to stabilize mood. Will continue to monitor her status.   7. Glaucoma: is without change  will continue timolol to both eyes twice daily fluorometholone 1 drop both eyes three times daily zaditor 1 drop both eyes twice daily as needed for allergies   8. Seasonal allergic rhinitis: is stable  will continue flonase daily claritin 10 mg daily  and mucinex twice  daily   9. Pulmonary arterial hypertension: stable  b/p 114/68 is currently not on medications;  no changes will monitor   10. Chronic pain syndrome: has chronic bilateral low back pain without sciatica:  Is stable  is on chronic narcotic therapy: will continue methadone 5 mg NIGHTLY robaxin 500 mg four  times daily as needed; neurontin 300 mg twice daily   voltaren gel 1 gm twice daily to knees oxycodone 5 every 4 hours as needed   11.  Constipation: stable  will continue MOM 30 cc every other day as needed;  miralax  17 gm twice daily as needed will monitor          MD is aware of resident's narcotic use and is in agreement with current plan of care. We will attempt to wean resident as apropriate   Ok Edwards NP [facility]  Contact [phone] Monday through Friday 8am- 5pm  After hours call [phone]

## 2017-05-21 DIAGNOSIS — E708 Other disorders of aromatic amino-acid metabolism: Secondary | ICD-10-CM | POA: Diagnosis not present

## 2017-05-21 DIAGNOSIS — R569 Unspecified convulsions: Secondary | ICD-10-CM | POA: Diagnosis not present

## 2017-05-21 DIAGNOSIS — Z79899 Other long term (current) drug therapy: Secondary | ICD-10-CM | POA: Diagnosis not present

## 2017-05-21 DIAGNOSIS — Z7901 Long term (current) use of anticoagulants: Secondary | ICD-10-CM | POA: Diagnosis not present

## 2017-05-21 DIAGNOSIS — D649 Anemia, unspecified: Secondary | ICD-10-CM | POA: Diagnosis not present

## 2017-05-21 DIAGNOSIS — E119 Type 2 diabetes mellitus without complications: Secondary | ICD-10-CM | POA: Diagnosis not present

## 2017-05-21 DIAGNOSIS — I10 Essential (primary) hypertension: Secondary | ICD-10-CM | POA: Diagnosis not present

## 2017-05-21 DIAGNOSIS — E785 Hyperlipidemia, unspecified: Secondary | ICD-10-CM | POA: Diagnosis not present

## 2017-05-21 LAB — BASIC METABOLIC PANEL
BUN: 10 (ref 4–21)
CREATININE: 0.4 — AB (ref 0.5–1.1)
Glucose: 101
Potassium: 4.3 (ref 3.4–5.3)
Sodium: 131 — AB (ref 137–147)

## 2017-05-21 LAB — CBC AND DIFFERENTIAL
HCT: 34 — AB (ref 36–46)
Hemoglobin: 11.5 — AB (ref 12.0–16.0)
Neutrophils Absolute: 3
PLATELETS: 208 (ref 150–399)
WBC: 5.6

## 2017-05-21 LAB — HEMOGLOBIN A1C: HEMOGLOBIN A1C: 5.1

## 2017-05-21 LAB — LIPID PANEL
Cholesterol: 147 (ref 0–200)
HDL: 57 (ref 35–70)
LDL Cholesterol: 74
Triglycerides: 77 (ref 40–160)

## 2017-05-21 LAB — HEPATIC FUNCTION PANEL
ALK PHOS: 66 (ref 25–125)
ALT: 5 — AB (ref 7–35)
AST: 9 — AB (ref 13–35)
BILIRUBIN, TOTAL: 0.2

## 2017-05-22 ENCOUNTER — Non-Acute Institutional Stay (SKILLED_NURSING_FACILITY): Payer: PPO | Admitting: Adult Health

## 2017-05-22 ENCOUNTER — Encounter: Payer: Self-pay | Admitting: Adult Health

## 2017-05-22 DIAGNOSIS — E7220 Disorder of urea cycle metabolism, unspecified: Secondary | ICD-10-CM | POA: Insufficient documentation

## 2017-05-22 DIAGNOSIS — L98499 Non-pressure chronic ulcer of skin of other sites with unspecified severity: Secondary | ICD-10-CM | POA: Diagnosis not present

## 2017-05-22 NOTE — Progress Notes (Signed)
Location:   [facility] Room Number: 105 A Place of Service:  SNF (31)   CODE STATUS: DNR  Allergies  Allergen Reactions  . Tape Other (See Comments)    SKIN IS VERY THIN; IT BRUISES AND TEARS EASILY!! -THX    Chief Complaint  Patient presents with  . Acute Visit    Lab follow up    HPI:  Her ammonia is elevated at 104. She does not demonstrate any symptoms of an elevated ammonia level. She is not obtunded; she remains alert and oriented. She is up late at night up to 4 AM and will sleep during the daytime. There are no reports of missed medications; no changes in appetite; no reports of uncontrolled pain. There are no nursing concerns at this time.   Past Medical History:  Diagnosis Date  . Anemia   . Arthritis   . CHF (congestive heart failure) ([place])   . Depression   . GERD (gastroesophageal reflux disease)   . Hyponatremia 01/2016  . Perforated chronic gastric ulcer ([place]) 08/11/2014  . Pneumonia   . Pulmonary arterial hypertension ([place]) 11/28/2014    Past Surgical History:  Procedure Laterality Date  . ABDOMINAL HYSTERECTOMY    . ABDOMINAL SURGERY    . BACK SURGERY  2000  . COLON SURGERY     x2-blockage  . COLONOSCOPY    . ERCP    . HERNIA REPAIR  2012   x2 with mesh  . JOINT REPLACEMENT Left    shoulder  . LAPAROTOMY N/A 08/04/2014   Procedure: EXPLORATORY LAPAROTOMY, REPAIR OF PERFORATED ULCER;  Surgeon: Excell Seltzer, MD;  Location: [facility];  Service: General;  Laterality: N/A;  . LAPAROTOMY N/A 12/21/2015   Procedure: EXPLORATORY LAPAROTOMY/ REMOVAL OF MESH;  Surgeon: Coralie Keens, MD;  Location: [place];  Service: General;  Laterality: N/A;  . ORIF WRIST FRACTURE  2012   left  . REVERSE SHOULDER ARTHROPLASTY Right 12/04/2013  . REVERSE SHOULDER ARTHROPLASTY  12/04/2013   Procedure: REVERSE SHOULDER ARTHROPLASTY;  Surgeon: Nita Sells, MD;  Location: [facility] OR;  Service: Orthopedics;;  Right reverse total shoulder arthroplasty    . ROTATOR CUFF REPAIR     dr Tamera Punt  . SHOULDER ARTHROSCOPY  2012   lt and rt  . TENDON REPAIR Right 04/13/2014   Procedure: RIGHT HAND CENTRAL TENDON CENTRALIZATION OF LONG AND RING FINGERS;  Surgeon: Jolyn Nap, MD;  Location: [place];  Service: Orthopedics;  Laterality: Right;    Social History   Socioeconomic History  . Marital status: Married    Spouse name: Not on file  . Number of children: Not on file  . Years of education: Not on file  . Highest education level: Not on file  Social Needs  . Financial resource strain: Not on file  . Food insecurity - worry: Not on file  . Food insecurity - inability: Not on file  . Transportation needs - medical: Not on file  . Transportation needs - non-medical: Not on file  Occupational History  . Not on file  Tobacco Use  . Smoking status: Never Smoker  . Smokeless tobacco: Never Used  Substance and Sexual Activity  . Alcohol use: Yes    Alcohol/week: 4.2 oz    Types: 7 Glasses of wine per week    Comment: 8 oz -12 oz of wine every   . Drug use: No  . Sexual activity: Not on file  Other Topics Concern  .  Not on file  Social History Narrative  . Not on file   Family History  Problem Relation Age of Onset  . Stroke Mother   . Breast cancer Sister   . Ulcerative colitis Daughter   . Chronic fatigue Daughter   . Chronic fatigue Daughter       VITAL SIGNS BP 114/68   Pulse 70   Temp 98.7 F (37.1 C)   Resp (!) 22   Ht 5\' 3"  (1.6 m)   Wt 176 lb 9.6 oz (80.1 kg)   SpO2 94%   BMI 31.28 kg/m   Outpatient Encounter Medications as of 05/22/2017  Medication Sig Note  . acetaminophen (TYLENOL) 325 MG tablet Take 325 mg by mouth every 6 (six) hours as needed for mild pain.    . Amino Acids-Protein Hydrolys (FEEDING SUPPLEMENT, PRO-STAT SUGAR FREE 64,) LIQD Take 30 mLs by mouth 3 (three) times daily with meals.   . benzonatate (TESSALON) 100 MG capsule Take 100 mg by mouth 3 (three) times daily as  needed for cough. X 5 days then 1 capsule by mouth every 8 hours as needed   . bisacodyl (DULCOLAX) 10 MG suppository Place 1 suppository (10 mg total) rectally daily as needed for moderate constipation (May repeat times one).   Marland Kitchen desonide (DESOWEN) 0.05 % cream Apply to face topically as needed for rash/redness two times daily as needed   . diclofenac sodium (VOLTAREN) 1 % GEL Apply 1 g topically 2 (two) times daily. Apply to bilateral knees for pain   . divalproex (DEPAKOTE SPRINKLE) 125 MG capsule Give 4 Capsules (500 mg) by mouth at bedtime   . DULoxetine (CYMBALTA) 60 MG capsule Take 60 mg by mouth at bedtime.    . ENSURE (ENSURE) Take 120 mLs by mouth 2 (two) times daily.   . fluorometholone (FML) 0.1 % ophthalmic suspension Place 1 drop into both eyes 3 (three) times daily.   . fluticasone (FLONASE) 50 MCG/ACT nasal spray Place 1 spray into both nostrils at bedtime.   . gabapentin (NEURONTIN) 300 MG capsule Take 300 mg by mouth 2 (two) times daily.   Marland Kitchen guaiFENesin (MUCINEX) 600 MG 12 hr tablet Take 600 mg by mouth 2 (two) times daily.    Marland Kitchen ketotifen (ZADITOR) 0.025 % ophthalmic solution Place 1 drop into both eyes 2 (two) times daily as needed.   . loratadine (CLARITIN) 10 MG tablet Take 10 mg by mouth daily.   . magnesium hydroxide (MILK OF MAGNESIA) 400 MG/5ML suspension Take 60 mLs by mouth every other day. AS NEEDED FOR CONSTIPATION 08/04/2014: .   . methadone (DOLOPHINE) 5 MG tablet Take 1 tablet (5 mg total) by mouth at bedtime.   . methocarbamol (ROBAXIN) 500 MG tablet Take 500 mg by mouth every 6 (six) hours as needed.    . Multiple Vitamin (MULTIVITAMIN) tablet Take 1 tablet by mouth 3 (three) times daily.    . Omega-3 Fatty Acids (FISH OIL) 1000 MG CPDR Take 1,000 mg by mouth daily.    Marland Kitchen omeprazole (PRILOSEC) 20 MG capsule Take 20 mg by mouth daily.   Marland Kitchen oxyCODONE (ROXICODONE) 5 MG immediate release tablet Take 1 tablet (5 mg total) by mouth every 4 (four) hours as needed for severe  pain.   . OXYGEN Inhale into the lungs. 2-6 L/min to keep stat at 90 or above   . polyethylene glycol (MIRALAX / GLYCOLAX) packet Take 17 g by mouth 2 (two) times daily as needed for mild constipation.    Marland Kitchen  rivaroxaban (XARELTO) 20 MG TABS tablet Take 20 mg by mouth daily.    . temazepam (RESTORIL) 30 MG capsule Take 1 capsule (30 mg total) by mouth at bedtime.   . timolol (BETIMOL) 0.5 % ophthalmic solution Place 1 drop into both eyes 2 (two) times daily.    Marland Kitchen torsemide (DEMADEX) 20 MG tablet Take 2 tablets by mouth once daily   . [DISCONTINUED] UNABLE TO FIND House Supplement - Take 4 oz by mouth 2 times daily    No facility-administered encounter medications on file as of 05/22/2017.      SIGNIFICANT DIAGNOSTIC EXAMS  PREVIOUS  01-20-16; chest x-ray: 1. Cardiomegaly and mild interstitial edema. 2. Improved aeration.  01-21-16: ct of abdomen and pelvis: 1. Apparent wall thickening at the rectum could reflect mild proctitis. 2. Small bilateral pleural effusions noted. Bibasilar airspace opacities may reflect atelectasis or possibly infection. 3. Large right-sided hiatal hernia, containing most of the stomach. 4. Mild soft tissue inflammation suggested about the bladder. Would correlate for any evidence of cystitis. 5. Nonspecific soft tissue inflammation suggested about the transverse colon. 6. Mild inflammation about the gallbladder is nonspecific, given the underlying diffuse soft tissue inflammation about the abdomen. 7. Very large anterior abdominal wall defect again noted, with packing material and underlying soft tissue thickening. 8. Scattered diverticulosis along the distal sigmoid colon, without evidence of diverticulitis. 9. Scattered aortic atherosclerosis noted. 10. Right convex thoracolumbar scoliosis noted.   01-21-16 ct of neck: 1. Findings consistent with right parotiditis. No abscess identified. No significant lymphadenopathy. 2. Partially visualized small right pleural  effusion and dependent right upper lobe opacity which may represent associated pneumonia or atelectasis.  07-03-16: right hip x-ray:No evidence of fracture or dislocation.   07-03-16: lumbar spine x-ray: No evidence of fracture or subluxation along the lumbar spine. Right convex thoracolumbar scoliosis again noted, with underlying degenerative change.  NO NEW EXAMS   LABS REVIEWED:PREVIOUS    05-30-16: glucose 112; bun 12; creat 0.6; k+ 4.3 ;na++ 145 08-07-16: wbc 11.0; hgb 10-28-16: glucose 97 bun 15.2; creat 0.46; k+ 4.3; na++ 132; ca 9.3; liver normal albumin 3.4  11-28-16:glucose 83; bun 11.0; creat 0.65; k+ 4.2; na++ 133; ca 9.7  TODAY: wbc 5.6; hgb 11.5; hct 34.3; mcv 98.0 plt 208 glucose 101; bun 9.8; creat 0.41; k+ 4.3; na++ 131; liver normal albumin 2.9 hgb a1c 5.1; chol 147; ldl 74; trig 77; hdl 57; ammonia 104     Review of Systems  Constitutional: Negative for malaise/fatigue.  Respiratory: Negative for cough and shortness of breath.   Cardiovascular: Negative for chest pain, palpitations and leg swelling.  Gastrointestinal: Negative for abdominal pain, constipation and heartburn.  Musculoskeletal: Negative for back pain, joint pain and myalgias.  Skin: Negative.   Neurological: Negative for dizziness.  Psychiatric/Behavioral: The patient is not nervous/anxious.     Physical Exam  Constitutional: She is oriented to person, place, and time. She appears well-developed and well-nourished. No distress.  Neck: No thyromegaly present.  Cardiovascular: Normal rate, regular rhythm and intact distal pulses.  Murmur heard. 1/6  Pulmonary/Chest: Effort normal and breath sounds normal. No respiratory distress.  Abdominal: Soft. Bowel sounds are normal. She exhibits no distension. There is no tenderness.  Musculoskeletal: She exhibits no edema.  Able to move upper extremities   Lymphadenopathy:    She has no cervical adenopathy.  Neurological: She is alert and oriented to person,  place, and time.  Skin: Skin is warm and dry. She is not diaphoretic.  Abdominal  wound without signs of infection present   Psychiatric: She has a normal mood and affect.    ASSESSMENT/ PLAN:  TODAY:   1. Hyperammonemia: her ammonia level is 104; could be an affect of depakote; question lab/collection error; will repeat ammonia level and will monitor    MD is aware of resident's narcotic use and is in agreement with current plan of care. We will attempt to wean resident as apropriate    Ok Edwards NP [facility]  Contact [phone] Monday through Friday 8am- 5pm  After hours call [phone]

## 2017-05-29 DIAGNOSIS — L98499 Non-pressure chronic ulcer of skin of other sites with unspecified severity: Secondary | ICD-10-CM | POA: Diagnosis not present

## 2017-06-04 ENCOUNTER — Other Ambulatory Visit: Payer: Self-pay

## 2017-06-04 DIAGNOSIS — G8929 Other chronic pain: Secondary | ICD-10-CM

## 2017-06-04 DIAGNOSIS — G894 Chronic pain syndrome: Secondary | ICD-10-CM

## 2017-06-04 DIAGNOSIS — M545 Low back pain: Secondary | ICD-10-CM

## 2017-06-04 MED ORDER — METHADONE HCL 5 MG PO TABS: 5.0000 mg | ORAL_TABLET | Freq: Every day | ORAL | 0 refills | Status: DC

## 2017-06-04 MED ORDER — TEMAZEPAM 30 MG PO CAPS: 30.0000 mg | ORAL_CAPSULE | Freq: Every day | ORAL | 0 refills | Status: DC

## 2017-06-04 NOTE — Telephone Encounter (Signed)
RX faxed to [facility] @ [phone], phone number [phone] 

## 2017-06-05 DIAGNOSIS — L98499 Non-pressure chronic ulcer of skin of other sites with unspecified severity: Secondary | ICD-10-CM | POA: Diagnosis not present

## 2017-06-13 DIAGNOSIS — E708 Other disorders of aromatic amino-acid metabolism: Secondary | ICD-10-CM | POA: Diagnosis not present

## 2017-06-13 DIAGNOSIS — Z5181 Encounter for therapeutic drug level monitoring: Secondary | ICD-10-CM | POA: Diagnosis not present

## 2017-06-14 ENCOUNTER — Encounter: Payer: Self-pay | Admitting: Adult Health

## 2017-06-14 ENCOUNTER — Non-Acute Institutional Stay (SKILLED_NURSING_FACILITY): Payer: PPO | Admitting: Adult Health

## 2017-06-14 DIAGNOSIS — T8189XD Other complications of procedures, not elsewhere classified, subsequent encounter: Secondary | ICD-10-CM | POA: Diagnosis not present

## 2017-06-14 NOTE — Progress Notes (Signed)
Location:   [facility] Room Number: 105 A Place of Service:  SNF (31)   CODE STATUS: DNR (Most form updated 08/20/16)  Allergies  Allergen Reactions  . Tape Other (See Comments)    SKIN IS VERY THIN; IT BRUISES AND TEARS EASILY!! -THX    Chief Complaint  Patient presents with  . Acute Visit    Wound Care    HPI:  She has an abdominal surgical wound which is now superficial. On the left side there is an area with slough present. No signs of infection present; there are no complaints of pain; there are no reports of fevers present.    Past Medical History:  Diagnosis Date  . Anemia   . Arthritis   . CHF (congestive heart failure) ([place])   . Depression   . GERD (gastroesophageal reflux disease)   . Hyponatremia 01/2016  . Perforated chronic gastric ulcer ([place]) 08/11/2014  . Pneumonia   . Pulmonary arterial hypertension ([place]) 11/28/2014    Past Surgical History:  Procedure Laterality Date  . ABDOMINAL HYSTERECTOMY    . ABDOMINAL SURGERY    . BACK SURGERY  2000  . COLON SURGERY     x2-blockage  . COLONOSCOPY    . ERCP    . HERNIA REPAIR  2012   x2 with mesh  . JOINT REPLACEMENT Left    shoulder  . LAPAROTOMY N/A 08/04/2014   Procedure: EXPLORATORY LAPAROTOMY, REPAIR OF PERFORATED ULCER;  Surgeon: Excell Seltzer, MD;  Location: [facility];  Service: General;  Laterality: N/A;  . LAPAROTOMY N/A 12/21/2015   Procedure: EXPLORATORY LAPAROTOMY/ REMOVAL OF MESH;  Surgeon: Coralie Keens, MD;  Location: [place];  Service: General;  Laterality: N/A;  . ORIF WRIST FRACTURE  2012   left  . REVERSE SHOULDER ARTHROPLASTY Right 12/04/2013  . REVERSE SHOULDER ARTHROPLASTY  12/04/2013   Procedure: REVERSE SHOULDER ARTHROPLASTY;  Surgeon: Nita Sells, MD;  Location: [facility] OR;  Service: Orthopedics;;  Right reverse total shoulder arthroplasty  . ROTATOR CUFF REPAIR     dr Tamera Punt  . SHOULDER ARTHROSCOPY  2012   lt and rt  . TENDON REPAIR Right 04/13/2014   Procedure: RIGHT HAND CENTRAL TENDON CENTRALIZATION OF LONG AND RING FINGERS;  Surgeon: Jolyn Nap, MD;  Location: [place];  Service: Orthopedics;  Laterality: Right;    Social History   Socioeconomic History  . Marital status: Married    Spouse name: Not on file  . Number of children: Not on file  . Years of education: Not on file  . Highest education level: Not on file  Occupational History  . Not on file  Social Needs  . Financial resource strain: Not on file  . Food insecurity:    Worry: Not on file    Inability: Not on file  . Transportation needs:    Medical: Not on file    Non-medical: Not on file  Tobacco Use  . Smoking status: Never Smoker  . Smokeless tobacco: Never Used  Substance and Sexual Activity  . Alcohol use: Yes    Alcohol/week: 4.2 oz    Types: 7 Glasses of wine per week    Comment: 8 oz -12 oz of wine every   . Drug use: No  . Sexual activity: Not on file  Lifestyle  . Physical activity:    Days per week: Not on file    Minutes per session: Not on file  . Stress: Not on file  Relationships  . Social connections:    Talks on phone: Not on file    Gets together: Not on file    Attends religious service: Not on file    Active member of club or organization: Not on file    Attends meetings of clubs or organizations: Not on file    Relationship status: Not on file  . Intimate partner violence:    Fear of current or ex partner: Not on file    Emotionally abused: Not on file    Physically abused: Not on file    Forced sexual activity: Not on file  Other Topics Concern  . Not on file  Social History Narrative  . Not on file   Family History  Problem Relation Age of Onset  . Stroke Mother   . Breast cancer Sister   . Ulcerative colitis Daughter   . Chronic fatigue Daughter   . Chronic fatigue Daughter       VITAL SIGNS BP 127/70   Pulse 74   Temp 98.4 F (36.9 C)   Resp 16   Ht 5\' 3"  (1.6 m)   Wt 176 lb 9.6 oz  (80.1 kg)   SpO2 97%   BMI 31.28 kg/m   Outpatient Encounter Medications as of 06/14/2017  Medication Sig Note  . acetaminophen (TYLENOL) 325 MG tablet Take 325 mg by mouth every 6 (six) hours as needed for mild pain.    . Amino Acids-Protein Hydrolys (FEEDING SUPPLEMENT, PRO-STAT SUGAR FREE 64,) LIQD Take 30 mLs by mouth 3 (three) times daily with meals.   . benzonatate (TESSALON) 100 MG capsule Take 100 mg by mouth 3 (three) times daily as needed for cough. X 5 days then 1 capsule by mouth every 8 hours as needed   . bisacodyl (DULCOLAX) 10 MG suppository Place 1 suppository (10 mg total) rectally daily as needed for moderate constipation (May repeat times one).   Marland Kitchen desonide (DESOWEN) 0.05 % cream Apply to face topically as needed for rash/redness two times daily as needed   . diclofenac sodium (VOLTAREN) 1 % GEL Apply 1 g topically 2 (two) times daily. Apply to bilateral knees for pain   . divalproex (DEPAKOTE SPRINKLE) 125 MG capsule Give 4 Capsules (500 mg) by mouth at bedtime   . DULoxetine (CYMBALTA) 60 MG capsule Take 60 mg by mouth at bedtime.    . ENSURE (ENSURE) Take 120 mLs by mouth 2 (two) times daily.   . fluorometholone (FML) 0.1 % ophthalmic suspension Place 1 drop into both eyes 3 (three) times daily.   . fluticasone (FLONASE) 50 MCG/ACT nasal spray Place 1 spray into both nostrils at bedtime.   . gabapentin (NEURONTIN) 300 MG capsule Take 300 mg by mouth 2 (two) times daily.   Marland Kitchen guaiFENesin (MUCINEX) 600 MG 12 hr tablet Take 600 mg by mouth 2 (two) times daily.    Marland Kitchen ketotifen (ZADITOR) 0.025 % ophthalmic solution Place 1 drop into both eyes 2 (two) times daily as needed.   . loratadine (CLARITIN) 10 MG tablet Take 10 mg by mouth daily.   . magnesium hydroxide (MILK OF MAGNESIA) 400 MG/5ML suspension Take 60 mLs by mouth every other day. AS NEEDED FOR CONSTIPATION 08/04/2014: .   . methadone (DOLOPHINE) 5 MG tablet Take 1 tablet (5 mg total) by mouth at bedtime.   .  methocarbamol (ROBAXIN) 500 MG tablet Take 500 mg by mouth every 6 (six) hours as needed.    . Multiple Vitamin (MULTIVITAMIN) tablet  Take 1 tablet by mouth 3 (three) times daily.    . Omega-3 Fatty Acids (FISH OIL) 1000 MG CPDR Take 1,000 mg by mouth daily.    Marland Kitchen oxyCODONE (ROXICODONE) 5 MG immediate release tablet Take 1 tablet (5 mg total) by mouth every 4 (four) hours as needed for severe pain.   . OXYGEN Inhale into the lungs. 2-6 L/min to keep stat at 90 or above   . polyethylene glycol (MIRALAX / GLYCOLAX) packet Take 17 g by mouth 2 (two) times daily as needed for mild constipation.    . ranitidine (ZANTAC) 150 MG tablet Take 150 mg by mouth 2 (two) times daily.   . rivaroxaban (XARELTO) 20 MG TABS tablet Take 20 mg by mouth daily.    . temazepam (RESTORIL) 30 MG capsule Take 1 capsule (30 mg total) by mouth at bedtime.   . timolol (BETIMOL) 0.5 % ophthalmic solution Place 1 drop into both eyes 2 (two) times daily.    Marland Kitchen torsemide (DEMADEX) 20 MG tablet Take 2 tablets by mouth once daily   . [DISCONTINUED] omeprazole (PRILOSEC) 20 MG capsule Take 20 mg by mouth daily.    No facility-administered encounter medications on file as of 06/14/2017.      SIGNIFICANT DIAGNOSTIC EXAMS  PREVIOUS  01-20-16; chest x-ray: 1. Cardiomegaly and mild interstitial edema. 2. Improved aeration.  01-21-16: ct of abdomen and pelvis: 1. Apparent wall thickening at the rectum could reflect mild proctitis. 2. Small bilateral pleural effusions noted. Bibasilar airspace opacities may reflect atelectasis or possibly infection. 3. Large right-sided hiatal hernia, containing most of the stomach. 4. Mild soft tissue inflammation suggested about the bladder. Would correlate for any evidence of cystitis. 5. Nonspecific soft tissue inflammation suggested about the transverse colon. 6. Mild inflammation about the gallbladder is nonspecific, given the underlying diffuse soft tissue inflammation about the abdomen. 7.  Very large anterior abdominal wall defect again noted, with packing material and underlying soft tissue thickening. 8. Scattered diverticulosis along the distal sigmoid colon, without evidence of diverticulitis. 9. Scattered aortic atherosclerosis noted. 10. Right convex thoracolumbar scoliosis noted.   01-21-16 ct of neck: 1. Findings consistent with right parotiditis. No abscess identified. No significant lymphadenopathy. 2. Partially visualized small right pleural effusion and dependent right upper lobe opacity which may represent associated pneumonia or atelectasis.  07-03-16: right hip x-ray:No evidence of fracture or dislocation.   07-03-16: lumbar spine x-ray: No evidence of fracture or subluxation along the lumbar spine. Right convex thoracolumbar scoliosis again noted, with underlying degenerative change.  NO NEW EXAMS   LABS REVIEWED:PREVIOUS    05-30-16: glucose 112; bun 12; creat 0.6; k+ 4.3 ;na++ 145 08-07-16: wbc 11.0; hgb 10-28-16: glucose 97 bun 15.2; creat 0.46; k+ 4.3; na++ 132; ca 9.3; liver normal albumin 3.4  11-28-16:glucose 83; bun 11.0; creat 0.65; k+ 4.2; na++ 133; ca 9.7 05-21-17: wbc 5.6; hgb 11.5; hct 34.3; mcv 98.0 plt 208 glucose 101; bun 9.8; creat 0.41; k+ 4.3; na++ 131; liver normal albumin 2.9 hgb a1c 5.1; chol 147; ldl 74; trig 77; hdl 57; ammonia 104   NO NEW LABS.     Review of Systems  Constitutional: Negative for malaise/fatigue.  Respiratory: Negative for cough and shortness of breath.   Cardiovascular: Negative for chest pain, palpitations and leg swelling.  Gastrointestinal: Negative for abdominal pain, constipation and heartburn.  Musculoskeletal: Negative for back pain, joint pain and myalgias.  Skin: Negative.   Neurological: Negative for dizziness.  Psychiatric/Behavioral: The patient is not nervous/anxious.  Physical Exam  Constitutional: She is oriented to person, place, and time. She appears well-developed and well-nourished. No  distress.  Neck: No thyromegaly present.  Cardiovascular: Normal rate, regular rhythm and intact distal pulses.  Murmur heard. 1/6  Pulmonary/Chest: Effort normal and breath sounds normal. No respiratory distress.  Abdominal: Soft. Bowel sounds are normal. She exhibits no distension. There is no tenderness.  Musculoskeletal: She exhibits no edema.  Is able to move upper extremities   Lymphadenopathy:    She has no cervical adenopathy.  Neurological: She is alert and oriented to person, place, and time.  Skin: Skin is warm and dry. She is not diaphoretic.  Abdominal wound is 60% skin: 4 x 12.5 x 0.1 cm is superficial on left side of wound area present that has slough present. Will need santyl in that area.    Psychiatric: She has a normal mood and affect.      ASSESSMENT/ PLAN:  TODAY:   1. Abdominal surgical wound: will continue betadine to wound and will use santyl to left area that has slough. Will monitor     MD is aware of resident's narcotic use and is in agreement with current plan of care. We will attempt to wean resident as apropriate    Ok Edwards NP [facility]  Contact [phone] Monday through Friday 8am- 5pm  After hours call [phone]

## 2017-06-19 ENCOUNTER — Encounter: Payer: Self-pay | Admitting: Adult Health

## 2017-06-19 ENCOUNTER — Non-Acute Institutional Stay (SKILLED_NURSING_FACILITY): Payer: PPO | Admitting: Adult Health

## 2017-06-19 DIAGNOSIS — M7918 Myalgia, other site: Secondary | ICD-10-CM | POA: Diagnosis not present

## 2017-06-19 DIAGNOSIS — F418 Other specified anxiety disorders: Secondary | ICD-10-CM | POA: Diagnosis not present

## 2017-06-19 DIAGNOSIS — M170 Bilateral primary osteoarthritis of knee: Secondary | ICD-10-CM | POA: Diagnosis not present

## 2017-06-19 DIAGNOSIS — R627 Adult failure to thrive: Secondary | ICD-10-CM | POA: Diagnosis not present

## 2017-06-19 DIAGNOSIS — H409 Unspecified glaucoma: Secondary | ICD-10-CM | POA: Diagnosis not present

## 2017-06-19 DIAGNOSIS — K219 Gastro-esophageal reflux disease without esophagitis: Secondary | ICD-10-CM | POA: Diagnosis not present

## 2017-06-19 DIAGNOSIS — J302 Other seasonal allergic rhinitis: Secondary | ICD-10-CM

## 2017-06-19 DIAGNOSIS — G894 Chronic pain syndrome: Secondary | ICD-10-CM | POA: Diagnosis not present

## 2017-06-19 NOTE — Progress Notes (Signed)
Location:   [facility] Room Number: 105 A Place of Service:  SNF (31)   CODE STATUS: DNR (Most form updated 08-20-16)  Allergies  Allergen Reactions  . Tape Other (See Comments)    SKIN IS VERY THIN; IT BRUISES AND TEARS EASILY!! -THX    Chief Complaint  Patient presents with  . Medical Management of Chronic Issues    Allergic rhinitis; glaucoma; depression gerd.     HPI:  She is an 84 year old long term resident of this facility being seen for the management of her chronic illnesses: allergic rhinitis; glaucoma; depression; gerd. She denies any heart burn; denies any sinus congestion; denies any anxiety. There are no nursing concerns at this time.   Past Medical History:  Diagnosis Date  . Anemia   . Arthritis   . CHF (congestive heart failure) ([place])   . Depression   . GERD (gastroesophageal reflux disease)   . Hyponatremia 01/2016  . Perforated chronic gastric ulcer ([place]) 08/11/2014  . Pneumonia   . Pulmonary arterial hypertension ([place]) 11/28/2014    Past Surgical History:  Procedure Laterality Date  . ABDOMINAL HYSTERECTOMY    . ABDOMINAL SURGERY    . BACK SURGERY  2000  . COLON SURGERY     x2-blockage  . COLONOSCOPY    . ERCP    . HERNIA REPAIR  2012   x2 with mesh  . JOINT REPLACEMENT Left    shoulder  . LAPAROTOMY N/A 08/04/2014   Procedure: EXPLORATORY LAPAROTOMY, REPAIR OF PERFORATED ULCER;  Surgeon: Excell Seltzer, MD;  Location: [facility];  Service: General;  Laterality: N/A;  . LAPAROTOMY N/A 12/21/2015   Procedure: EXPLORATORY LAPAROTOMY/ REMOVAL OF MESH;  Surgeon: Coralie Keens, MD;  Location: [place];  Service: General;  Laterality: N/A;  . ORIF WRIST FRACTURE  2012   left  . REVERSE SHOULDER ARTHROPLASTY Right 12/04/2013  . REVERSE SHOULDER ARTHROPLASTY  12/04/2013   Procedure: REVERSE SHOULDER ARTHROPLASTY;  Surgeon: Nita Sells, MD;  Location: [facility] OR;  Service: Orthopedics;;  Right reverse total shoulder arthroplasty    . ROTATOR CUFF REPAIR     dr Tamera Punt  . SHOULDER ARTHROSCOPY  2012   lt and rt  . TENDON REPAIR Right 04/13/2014   Procedure: RIGHT HAND CENTRAL TENDON CENTRALIZATION OF LONG AND RING FINGERS;  Surgeon: Jolyn Nap, MD;  Location: [place];  Service: Orthopedics;  Laterality: Right;    Social History   Socioeconomic History  . Marital status: Married    Spouse name: Not on file  . Number of children: Not on file  . Years of education: Not on file  . Highest education level: Not on file  Occupational History  . Not on file  Social Needs  . Financial resource strain: Not on file  . Food insecurity:    Worry: Not on file    Inability: Not on file  . Transportation needs:    Medical: Not on file    Non-medical: Not on file  Tobacco Use  . Smoking status: Never Smoker  . Smokeless tobacco: Never Used  Substance and Sexual Activity  . Alcohol use: Yes    Alcohol/week: 4.2 oz    Types: 7 Glasses of wine per week    Comment: 8 oz -12 oz of wine every   . Drug use: No  . Sexual activity: Not on file  Lifestyle  . Physical activity:    Days per week: Not on file  Minutes per session: Not on file  . Stress: Not on file  Relationships  . Social connections:    Talks on phone: Not on file    Gets together: Not on file    Attends religious service: Not on file    Active member of club or organization: Not on file    Attends meetings of clubs or organizations: Not on file    Relationship status: Not on file  . Intimate partner violence:    Fear of current or ex partner: Not on file    Emotionally abused: Not on file    Physically abused: Not on file    Forced sexual activity: Not on file  Other Topics Concern  . Not on file  Social History Narrative  . Not on file   Family History  Problem Relation Age of Onset  . Stroke Mother   . Breast cancer Sister   . Ulcerative colitis Daughter   . Chronic fatigue Daughter   . Chronic fatigue Daughter        VITAL SIGNS BP 127/70   Pulse 74   Temp 98.4 F (36.9 C)   Resp 16   Ht 5\' 3"  (1.6 m)   Wt 176 lb 9.6 oz (80.1 kg)   SpO2 96%   BMI 31.28 kg/m   Outpatient Encounter Medications as of 06/19/2017  Medication Sig Note  . acetaminophen (TYLENOL) 325 MG tablet Take 325 mg by mouth every 6 (six) hours as needed for mild pain.    . Amino Acids-Protein Hydrolys (FEEDING SUPPLEMENT, PRO-STAT SUGAR FREE 64,) LIQD Take 30 mLs by mouth 3 (three) times daily with meals.   . benzonatate (TESSALON) 100 MG capsule Take 100 mg by mouth 3 (three) times daily as needed for cough. X 5 days then 1 capsule by mouth every 8 hours as needed   . bisacodyl (DULCOLAX) 10 MG suppository Place 1 suppository (10 mg total) rectally daily as needed for moderate constipation (May repeat times one).   Marland Kitchen desonide (DESOWEN) 0.05 % cream Apply to face topically as needed for rash/redness two times daily as needed   . diclofenac sodium (VOLTAREN) 1 % GEL Apply 1 g topically 2 (two) times daily. Apply to bilateral knees for pain   . divalproex (DEPAKOTE SPRINKLE) 125 MG capsule Give 4 Capsules (500 mg) by mouth at bedtime   . DULoxetine (CYMBALTA) 60 MG capsule Take 60 mg by mouth at bedtime.    . ENSURE (ENSURE) Take 120 mLs by mouth 2 (two) times daily.   . fluorometholone (FML) 0.1 % ophthalmic suspension Place 1 drop into both eyes 3 (three) times daily.   . fluticasone (FLONASE) 50 MCG/ACT nasal spray Place 1 spray into both nostrils at bedtime.   . gabapentin (NEURONTIN) 300 MG capsule Take 300 mg by mouth 2 (two) times daily.   Marland Kitchen guaiFENesin (MUCINEX) 600 MG 12 hr tablet Take 600 mg by mouth 2 (two) times daily.    Marland Kitchen ketotifen (ZADITOR) 0.025 % ophthalmic solution Place 1 drop into both eyes 2 (two) times daily as needed.   . loratadine (CLARITIN) 10 MG tablet Take 10 mg by mouth daily.   . magnesium hydroxide (MILK OF MAGNESIA) 400 MG/5ML suspension Take 60 mLs by mouth every other day. AS NEEDED FOR  CONSTIPATION 08/04/2014: .   . methadone (DOLOPHINE) 5 MG tablet Take 1 tablet (5 mg total) by mouth at bedtime.   . methocarbamol (ROBAXIN) 500 MG tablet Take 500 mg by mouth every  6 (six) hours as needed.    . Multiple Vitamin (MULTIVITAMIN) tablet Take 1 tablet by mouth 3 (three) times daily.    . Omega-3 Fatty Acids (FISH OIL) 1000 MG CPDR Take 1,000 mg by mouth daily.    Marland Kitchen oxyCODONE (ROXICODONE) 5 MG immediate release tablet Take 1 tablet (5 mg total) by mouth every 4 (four) hours as needed for severe pain.   . OXYGEN Inhale into the lungs. 2-6 L/min to keep stat at 90 or above   . polyethylene glycol (MIRALAX / GLYCOLAX) packet Take 17 g by mouth 2 (two) times daily as needed for mild constipation.    . ranitidine (ZANTAC) 150 MG tablet Take 150 mg by mouth 2 (two) times daily.   . rivaroxaban (XARELTO) 20 MG TABS tablet Take 20 mg by mouth daily.    . temazepam (RESTORIL) 30 MG capsule Take 1 capsule (30 mg total) by mouth at bedtime.   . timolol (BETIMOL) 0.5 % ophthalmic solution Place 1 drop into both eyes 2 (two) times daily.    Marland Kitchen torsemide (DEMADEX) 20 MG tablet Take 2 tablets by mouth once daily    No facility-administered encounter medications on file as of 06/19/2017.      SIGNIFICANT DIAGNOSTIC EXAMS  PREVIOUS  01-20-16; chest x-ray: 1. Cardiomegaly and mild interstitial edema. 2. Improved aeration.  01-21-16: ct of abdomen and pelvis: 1. Apparent wall thickening at the rectum could reflect mild proctitis. 2. Small bilateral pleural effusions noted. Bibasilar airspace opacities may reflect atelectasis or possibly infection. 3. Large right-sided hiatal hernia, containing most of the stomach. 4. Mild soft tissue inflammation suggested about the bladder. Would correlate for any evidence of cystitis. 5. Nonspecific soft tissue inflammation suggested about the transverse colon. 6. Mild inflammation about the gallbladder is nonspecific, given the underlying diffuse soft tissue  inflammation about the abdomen. 7. Very large anterior abdominal wall defect again noted, with packing material and underlying soft tissue thickening. 8. Scattered diverticulosis along the distal sigmoid colon, without evidence of diverticulitis. 9. Scattered aortic atherosclerosis noted. 10. Right convex thoracolumbar scoliosis noted.   01-21-16 ct of neck: 1. Findings consistent with right parotiditis. No abscess identified. No significant lymphadenopathy. 2. Partially visualized small right pleural effusion and dependent right upper lobe opacity which may represent associated pneumonia or atelectasis.  07-03-16: right hip x-ray:No evidence of fracture or dislocation.   07-03-16: lumbar spine x-ray: No evidence of fracture or subluxation along the lumbar spine. Right convex thoracolumbar scoliosis again noted, with underlying degenerative change.  NO NEW EXAMS   LABS REVIEWED:PREVIOUS    08-07-16: wbc 11.0; hgb 10-28-16: glucose 97 bun 15.2; creat 0.46; k+ 4.3; na++ 132; ca 9.3; liver normal albumin 3.4  11-28-16:glucose 83; bun 11.0; creat 0.65; k+ 4.2; na++ 133; ca 9.7 05-21-17: wbc 5.6; hgb 11.5; hct 34.3; mcv 98.0 plt 208 glucose 101; bun 9.8; creat 0.41; k+ 4.3; na++ 131; liver normal albumin 2.9 hgb a1c 5.1; chol 147; ldl 74; trig 77; hdl 57; ammonia 104 depakote 41   NO NEW LABS.     Review of Systems  Constitutional: Negative for malaise/fatigue.  Respiratory: Negative for cough and shortness of breath.   Cardiovascular: Negative for chest pain, palpitations and leg swelling.  Gastrointestinal: Negative for abdominal pain, constipation and heartburn.  Musculoskeletal: Negative for back pain, joint pain and myalgias.  Skin: Negative.   Neurological: Negative for dizziness.  Psychiatric/Behavioral: The patient is not nervous/anxious.     Physical Exam  Constitutional: She is oriented  to person, place, and time. She appears well-developed and well-nourished. No distress.  Neck:  No thyromegaly present.  Cardiovascular: Normal rate, regular rhythm and intact distal pulses.  Murmur heard. 1/6  Pulmonary/Chest: Effort normal and breath sounds normal. No respiratory distress.  Abdominal: Soft. Bowel sounds are normal. She exhibits no distension. There is no tenderness.  Musculoskeletal: She exhibits no edema.  Is able to move upper extremities   Lymphadenopathy:    She has no cervical adenopathy.  Neurological: She is alert and oriented to person, place, and time.  Skin: Skin is warm and dry. She is not diaphoretic.  Abdominal wound is 60% skin: 4 x 12.5 x 0.1 cm is superficial on left side of wound area present that has slough present  Psychiatric: She has a normal mood and affect.    ASSESSMENT/ PLAN:  TODAY:   1. Gerd: stable  will continue zantac 150 mg twice daily   2. Depression with anxiety:is stable  will continue cymbalta 60 mg daily takes depakote 500 mg nightly to stabilize mood. Will continue to monitor her status.   3. Glaucoma: is without change  will continue timolol to both eyes twice daily fluorometholone 1 drop both eyes three times daily zaditor 1 drop both eyes twice daily as needed for allergies   4. Seasonal allergic rhinitis: is stable  will continue flonase daily claritin 10 mg daily  and mucinex twice  daily   PREVIOUS:     9. Pulmonary arterial hypertension: stable  b/p 114/68 is currently not on medications;  no changes will monitor   10. Chronic pain syndrome: has chronic bilateral low back pain without sciatica:  Is stable  is on chronic narcotic therapy: will continue methadone 5 mg NIGHTLY robaxin 500 mg four  times daily as needed; neurontin 300 mg twice daily   voltaren gel 1 gm twice daily to knees oxycodone 5 every 4 hours as needed   11.  Constipation: stable  will continue MOM 30 cc every other day as needed;  miralax  17 gm twice daily as needed will monitor   1. Surgical wound, non healing subsequent encounter Status  post laparotomy without  removal of mesh: is improving; is now superficial. Is followed by wound dr. Aletha Halim monitor   2. Insomnia: is stable will continue  restoril  30 mg nightly she has not tolerated lower doses    3. Deep vein thrombosis of femoral vein of both lower extremities :is stable  will continue xarelto 20 mg daily    4. CHF, chronic:EF 55-60% (06-21-14) is stable will continue demadex 40 mg daily uses 02 as neededt       MD is aware of resident's narcotic use and is in agreement with current plan of care. We will attempt to wean resident as apropriate    Ok Edwards NP [facility]  Contact [phone] Monday through Friday 8am- 5pm  After hours call [phone]

## 2017-06-21 ENCOUNTER — Non-Acute Institutional Stay (SKILLED_NURSING_FACILITY): Payer: PPO | Admitting: Internal Medicine

## 2017-06-21 ENCOUNTER — Encounter: Payer: Self-pay | Admitting: Internal Medicine

## 2017-06-21 DIAGNOSIS — D649 Anemia, unspecified: Secondary | ICD-10-CM | POA: Diagnosis not present

## 2017-06-21 DIAGNOSIS — G894 Chronic pain syndrome: Secondary | ICD-10-CM

## 2017-06-21 DIAGNOSIS — M545 Low back pain, unspecified: Secondary | ICD-10-CM

## 2017-06-21 DIAGNOSIS — F418 Other specified anxiety disorders: Secondary | ICD-10-CM

## 2017-06-21 DIAGNOSIS — G8929 Other chronic pain: Secondary | ICD-10-CM

## 2017-06-21 DIAGNOSIS — I82413 Acute embolism and thrombosis of femoral vein, bilateral: Secondary | ICD-10-CM | POA: Diagnosis not present

## 2017-06-21 DIAGNOSIS — I509 Heart failure, unspecified: Secondary | ICD-10-CM | POA: Diagnosis not present

## 2017-06-21 DIAGNOSIS — R635 Abnormal weight gain: Secondary | ICD-10-CM | POA: Diagnosis not present

## 2017-06-21 NOTE — Progress Notes (Signed)
Patient ID: Monique Russo, female   DOB: 1930-05-12, 84 y.o.   MRN: [id number]  Location:  [facility] Room Number: 105 A Place of Service:  SNF (31) Provider:  [place], [place], DO  Patient Care Team: Gildardo Cranker, DO as PCP - General (Internal Medicine) [name], NP as Nurse Practitioner ([place]) [facility], [place] ([place])  Extended Emergency Contact Information Primary Emergency Contact: Daum,Bill Address: [address]          [place], [place] [address] Johnnette Litter of [place] Phone: [phone] Mobile Phone: [phone] Relation: Spouse Secondary Emergency Contact: Faythe Ghee Address: [address]          [place], [place] [address] Johnnette Litter of [place] Phone: [phone] Mobile Phone: [phone] Relation: Sister  Code Status:  DNR Goals of care: Advanced Directive information Advanced Directives 06/21/2017  Does Patient Have a Medical Advance Directive? Yes  Type of Advance Directive Out of facility DNR (pink MOST or yellow form)  Does patient want to make changes to medical advance directive? No - Patient declined  Copy of [place] in Chart? -  Would patient like information on creating a medical advance directive? No - Patient declined  Pre-existing out of facility DNR order (yellow form or pink MOST form) Yellow form placed in chart (order not valid for inpatient use);Pink MOST form placed in chart (order not valid for inpatient use)     Chief Complaint  Patient presents with  . Medical Management of Chronic Issues    1 month follow up    HPI:  Pt is a 84 y.o. female seen today for medical management of chronic diseases.  She reports c/a weight gain and has gained 12 lbs since July 2018. She is bed bound. She believes her medication is giving her an increased appetite. No other concerns. She did not take her meds yesterday as "I did not feel like  it". She gets promod 3 times daily for chronic abdominal wound.  LDL 74; A1c 5.1%; NA 131; K 4.3; Cr 0.4; Hgb 11.5  GERD - stable on zantac 150 mg twice daily   Depression with anxiety - mood stable on cymbalta 60 mg daily; depakote 500 mg nightly. She is stable on this regimen and GDR would cause harm.   Glaucoma - stable on timolol to both eyes twice daily; fluorometholone 1 drop both eyes three times daily   Seasonal allergic rhinitis - stable on flonase daily; claritin 10 mg daily; mucinex twice daily;  zaditor 1 drop both eyes twice daily as needed for allergies  Pulmonary arterial hypertension - stable without medication  Chronic pain syndrome due to chronic bilateral low back pain without sciatica - controlled on methadone 5 mg qhs; robaxin 500 mg QID prn; neurontin 300 mg twice daily; voltaren gel 1 gm twice daily to knees; oxycodone 5 every 4 hours as needed   Constipation - stable on MOM 30 cc every other day as needed;  miralax  17 gm twice daily as needed  Chronic abdominal surgical wound, non healing  - s/p laparotomy without  removal of mesh- improving and now superficial; followed by facility wound care provider  Insomnia - stable on restoril  30 mg nightly. She has not tolerated lower doses  Hx Deep vein thrombosis of femoral vein of b/l LE - stable on xarelto 20 mg daily    Hx chronic CHF - EF 55-60% (06-21-14). Stable on  demadex 40 mg daily; [place] O2 prn   Past Medical History:  Diagnosis Date  . Anemia   . Arthritis   . CHF (congestive heart failure) ([place])   . Depression   . GERD (gastroesophageal reflux disease)   . Hyponatremia 01/2016  . Perforated chronic gastric ulcer ([place]) 08/11/2014  . Pneumonia   . Pulmonary arterial hypertension ([place]) 11/28/2014   Past Surgical History:  Procedure Laterality Date  . ABDOMINAL HYSTERECTOMY    . ABDOMINAL SURGERY    . BACK SURGERY  2000  . COLON SURGERY     x2-blockage  . COLONOSCOPY    . ERCP    . HERNIA REPAIR   2012   x2 with mesh  . JOINT REPLACEMENT Left    shoulder  . LAPAROTOMY N/A 08/04/2014   Procedure: EXPLORATORY LAPAROTOMY, REPAIR OF PERFORATED ULCER;  Surgeon: Excell Seltzer, MD;  [facility];  Service: General;  Laterality: N/A;  . LAPAROTOMY N/A 12/21/2015   Procedure: EXPLORATORY LAPAROTOMY/ REMOVAL OF MESH;  Surgeon: Coralie Keens, MD;  Location: [facility];  Service: General;  Laterality: N/A;  . ORIF WRIST FRACTURE  2012   left  . REVERSE SHOULDER ARTHROPLASTY Right 12/04/2013  . REVERSE SHOULDER ARTHROPLASTY  12/04/2013   Procedure: REVERSE SHOULDER ARTHROPLASTY;  Surgeon: Nita Sells, MD;  Location: [facility] OR;  Service: Orthopedics;;  Right reverse total shoulder arthroplasty  . ROTATOR CUFF REPAIR     dr Tamera Punt  . SHOULDER ARTHROSCOPY  2012   lt and rt  . TENDON REPAIR Right 04/13/2014   Procedure: RIGHT HAND CENTRAL TENDON CENTRALIZATION OF LONG AND RING FINGERS;  Surgeon: Jolyn Nap, MD;  Location: [facility];  Service: Orthopedics;  Laterality: Right;    Allergies  Allergen Reactions  . Tape Other (See Comments)    SKIN IS VERY THIN; IT BRUISES AND TEARS EASILY!! -THX    Outpatient Encounter Medications as of 06/21/2017  Medication Sig  . acetaminophen (TYLENOL) 325 MG tablet Take 325 mg by mouth every 6 (six) hours as needed for mild pain.   . benzonatate (TESSALON) 100 MG capsule Take 100 mg by mouth 3 (three) times daily as needed for cough. X 5 days then 1 capsule by mouth every 8 hours as needed  . bisacodyl (DULCOLAX) 10 MG suppository Place 1 suppository (10 mg total) rectally daily as needed for moderate constipation (May repeat times one).  Marland Kitchen desonide (DESOWEN) 0.05 % cream Apply to face topically as needed for rash/redness two times daily as needed  . diclofenac sodium (VOLTAREN) 1 % GEL Apply 1 g topically 2 (two) times daily. Apply to bilateral knees for pain  . divalproex (DEPAKOTE SPRINKLE) 125 MG capsule Give 4 Capsules  (500 mg) by mouth at bedtime  . DULoxetine (CYMBALTA) 60 MG capsule Take 60 mg by mouth at bedtime.   . ENSURE (ENSURE) Take 120 mLs by mouth 2 (two) times daily.  . fluorometholone (FML) 0.1 % ophthalmic suspension Place 1 drop into both eyes 3 (three) times daily.  . fluticasone (FLONASE) 50 MCG/ACT nasal spray Place 1 spray into both nostrils at bedtime.  . gabapentin (NEURONTIN) 300 MG capsule Take 300 mg by mouth 2 (two) times daily.  Marland Kitchen guaiFENesin (MUCINEX) 600 MG 12 hr tablet Take 600 mg by mouth 2 (two) times daily.   Marland Kitchen ketotifen (ZADITOR) 0.025 % ophthalmic solution Place 1 drop into both eyes 2 (two) times daily as needed.  . loratadine (CLARITIN) 10 MG tablet Take 10  mg by mouth daily.  . magnesium hydroxide (MILK OF MAGNESIA) 400 MG/5ML suspension Take 60 mLs by mouth every other day. AS NEEDED FOR CONSTIPATION  . methadone (DOLOPHINE) 5 MG tablet Take 1 tablet (5 mg total) by mouth at bedtime.  . methocarbamol (ROBAXIN) 500 MG tablet Take 500 mg by mouth every 6 (six) hours as needed.   . Multiple Vitamin (MULTIVITAMIN) tablet Take 1 tablet by mouth 3 (three) times daily.   . Nutritional Supplements (PROMOD) LIQD Take 30 mLs by mouth 3 (three) times daily with meals.  . Omega-3 Fatty Acids (FISH OIL) 1000 MG CPDR Take 1,000 mg by mouth daily.   Marland Kitchen oxyCODONE (ROXICODONE) 5 MG immediate release tablet Take 1 tablet (5 mg total) by mouth every 4 (four) hours as needed for severe pain.  . OXYGEN Inhale into the lungs. 2-6 L/min to keep stat at 90 or above  . polyethylene glycol (MIRALAX / GLYCOLAX) packet Take 17 g by mouth 2 (two) times daily as needed for mild constipation.   . ranitidine (ZANTAC) 150 MG tablet Take 150 mg by mouth 2 (two) times daily.  . rivaroxaban (XARELTO) 20 MG TABS tablet Take 20 mg by mouth daily.   . temazepam (RESTORIL) 30 MG capsule Take 1 capsule (30 mg total) by mouth at bedtime.  . timolol (BETIMOL) 0.5 % ophthalmic solution Place 1 drop into both eyes 2  (two) times daily.   Marland Kitchen torsemide (DEMADEX) 20 MG tablet Take 2 tablets by mouth once daily  . [DISCONTINUED] Amino Acids-Protein Hydrolys (FEEDING SUPPLEMENT, PRO-STAT SUGAR FREE 64,) LIQD Take 30 mLs by mouth 3 (three) times daily with meals.   No facility-administered encounter medications on file as of 06/21/2017.     Review of Systems  Unable to perform ROS: Psychiatric disorder    Immunization History  Administered Date(s) Administered  . Influenza Split 02/03/2015  . Influenza-Unspecified 01/18/2017   Pertinent  Health Maintenance Due  Topic Date Due  . DEXA SCAN  07/16/2017 (Originally 08/29/1997)  . INFLUENZA VACCINE  10/18/2017  . PNA vac Low Risk Adult  Discontinued   Fall Risk  09/11/2016  Falls in the past year? No   Functional Status Survey:    Vitals:   06/21/17 1318  BP: 130/74  Pulse: 80  Resp: 20  Temp: 97.6 F (36.4 C)  SpO2: 94%  Weight: 176 lb 9.6 oz (80.1 kg)  Height: 5\' 3"  (1.6 m)   Body mass index is 31.28 kg/m. Physical Exam  Constitutional: She appears well-developed and well-nourished.  Sitting up in bed in NAD  HENT:  Mouth/Throat: Oropharynx is clear and moist. No oropharyngeal exudate.  MMM; no oral thrush  Eyes: Pupils are equal, round, and reactive to light. No scleral icterus.  Neck: Neck supple. Carotid bruit is not present. No tracheal deviation present. No thyromegaly present.  Cardiovascular: Normal rate, regular rhythm and intact distal pulses. Exam reveals no gallop and no friction rub.  Murmur (1/6 SEM) heard. +1 RLE pitting edema; trace LLE edema; no calf TTP b/l; chronic venous stasis changes  Pulmonary/Chest: Effort normal and breath sounds normal. No stridor. No respiratory distress. She has no wheezes. She has no rales.  Abdominal: Soft. Normal appearance and bowel sounds are normal. She exhibits no distension and no mass. There is no hepatomegaly. There is no tenderness. There is no rigidity, no rebound and no guarding. No  hernia.  Obese; wound dsg c/d/i  Musculoskeletal: She exhibits edema.  Lymphadenopathy:    She  has no cervical adenopathy.  Neurological: She is alert.  Skin: Skin is warm and dry. No rash noted.  Psychiatric: She has a normal mood and affect. Her behavior is normal. Thought content normal.    Labs reviewed: Recent Labs    11/28/16 03/09/17 0022 05/21/17  NA 133* 131* 131*  K 4.2 4.4 4.3  CL  --  92*  --   GLUCOSE  --  98  --   BUN 11 22* 10  CREATININE 0.7 0.70 0.4*   Recent Labs    10/28/16 05/21/17  AST 11* 9*  ALT 6* 5*  ALKPHOS 73 66   Recent Labs    08/07/16 03/09/17 0022 05/21/17  WBC 11.0  --  5.6  NEUTROABS 8  --  3  HGB 11.1* 11.6* 11.5*  HCT 34* 34.0* 34*  PLT 226  --  208   Lab Results  Component Value Date   TSH 1.605 07/18/2014   Lab Results  Component Value Date   HGBA1C 5.1 05/21/2017   Lab Results  Component Value Date   CHOL 147 05/21/2017   HDL 57 05/21/2017   LDLCALC 74 05/21/2017   TRIG 77 05/21/2017    Significant Diagnostic Results in last 30 days:  No results found.  Assessment/Plan   ICD-10-CM   1. Weight gain R63.5    likely related to promod +/- cymbalta  2. Chronic pain syndrome G89.4   3. Depression with anxiety F41.8   4. Deep vein thrombosis (DVT) of femoral vein of both lower extremities, unspecified chronicity (HCC) I82.413   5. Chronic congestive heart failure, unspecified heart failure type (HCC) I50.9   6. Chronic bilateral low back pain without sciatica M54.5    G89.29   7. Anemia, unspecified type D64.9    Cont current meds as ordered  Reduce promod to daily  PT/OT/ST as indicated  Wound care as ordered  Cont [place] O2 prn  Will follow  Labs/tests ordered: none   Gayanne Prescott S. Perlie Gold  [facility] [address] [place], [place] [address] [phone] Cell (Monday-Friday 8 AM - 5 PM) [phone] After 5 PM and follow prompts

## 2017-06-25 DIAGNOSIS — Z7951 Long term (current) use of inhaled steroids: Secondary | ICD-10-CM | POA: Diagnosis not present

## 2017-06-25 DIAGNOSIS — Z961 Presence of intraocular lens: Secondary | ICD-10-CM | POA: Diagnosis not present

## 2017-06-25 DIAGNOSIS — H401134 Primary open-angle glaucoma, bilateral, indeterminate stage: Secondary | ICD-10-CM | POA: Diagnosis not present

## 2017-06-25 DIAGNOSIS — Z7901 Long term (current) use of anticoagulants: Secondary | ICD-10-CM | POA: Diagnosis not present

## 2017-06-28 DIAGNOSIS — R100 Acute abdomen: Secondary | ICD-10-CM | POA: Diagnosis not present

## 2017-06-28 DIAGNOSIS — G8911 Acute pain due to trauma: Secondary | ICD-10-CM | POA: Diagnosis not present

## 2017-06-28 DIAGNOSIS — T8189XS Other complications of procedures, not elsewhere classified, sequela: Secondary | ICD-10-CM | POA: Diagnosis not present

## 2017-07-12 DIAGNOSIS — F5101 Primary insomnia: Secondary | ICD-10-CM | POA: Diagnosis not present

## 2017-07-12 DIAGNOSIS — F331 Major depressive disorder, recurrent, moderate: Secondary | ICD-10-CM | POA: Diagnosis not present

## 2017-07-18 ENCOUNTER — Other Ambulatory Visit: Payer: Self-pay

## 2017-07-18 ENCOUNTER — Non-Acute Institutional Stay (SKILLED_NURSING_FACILITY): Payer: PPO | Admitting: Adult Health

## 2017-07-18 ENCOUNTER — Encounter: Payer: Self-pay | Admitting: Adult Health

## 2017-07-18 DIAGNOSIS — I2721 Secondary pulmonary arterial hypertension: Secondary | ICD-10-CM | POA: Diagnosis not present

## 2017-07-18 DIAGNOSIS — T8189XD Other complications of procedures, not elsewhere classified, subsequent encounter: Secondary | ICD-10-CM | POA: Diagnosis not present

## 2017-07-18 DIAGNOSIS — T402X5A Adverse effect of other opioids, initial encounter: Secondary | ICD-10-CM | POA: Diagnosis not present

## 2017-07-18 DIAGNOSIS — M545 Low back pain: Secondary | ICD-10-CM

## 2017-07-18 DIAGNOSIS — G894 Chronic pain syndrome: Secondary | ICD-10-CM | POA: Diagnosis not present

## 2017-07-18 DIAGNOSIS — G8929 Other chronic pain: Secondary | ICD-10-CM | POA: Diagnosis not present

## 2017-07-18 DIAGNOSIS — K5903 Drug induced constipation: Secondary | ICD-10-CM | POA: Diagnosis not present

## 2017-07-18 MED ORDER — OXYCODONE HCL 5 MG PO TABS: 5.0000 mg | ORAL_TABLET | ORAL | 0 refills | Status: DC | PRN

## 2017-07-18 MED ORDER — TEMAZEPAM 30 MG PO CAPS: 30.0000 mg | ORAL_CAPSULE | Freq: Every day | ORAL | 0 refills | Status: DC

## 2017-07-18 MED ORDER — METHADONE HCL 5 MG PO TABS: 5.0000 mg | ORAL_TABLET | Freq: Every day | ORAL | 0 refills | Status: DC

## 2017-07-18 NOTE — Telephone Encounter (Signed)
Rx faxed to [facility] (p) [phone], (f) [phone]  

## 2017-07-18 NOTE — Progress Notes (Signed)
Location:   [facility] Room Number: 105 A Place of Service:  SNF (31)   CODE STATUS: DNR (Most form updated 12-20-16)  Allergies  Allergen Reactions  . Tape Other (See Comments)    SKIN IS VERY THIN; IT BRUISES AND TEARS EASILY!! -THX    Chief Complaint  Patient presents with  . Medical Management of Chronic Issues    Pulmonary hypertension; constipation; chronic pain; surgical wound.     HPI:  She is a 84 year old long term resident of this facility being seen for the management of her chronic illnesses: pulmonary hypertension; constipation; chronic pain and surgical wound. Her abdominal wound continues to slowly heal. She states that her pain is being adequately managed; she denies any constipation. There are no nursing concerns at this time.   Past Medical History:  Diagnosis Date  . Anemia   . Arthritis   . CHF (congestive heart failure) ([place])   . Depression   . GERD (gastroesophageal reflux disease)   . Hyponatremia 01/2016  . Perforated chronic gastric ulcer ([place]) 08/11/2014  . Pneumonia   . Pulmonary arterial hypertension ([place]) 11/28/2014    Past Surgical History:  Procedure Laterality Date  . ABDOMINAL HYSTERECTOMY    . ABDOMINAL SURGERY    . BACK SURGERY  2000  . COLON SURGERY     x2-blockage  . COLONOSCOPY    . ERCP    . HERNIA REPAIR  2012   x2 with mesh  . JOINT REPLACEMENT Left    shoulder  . LAPAROTOMY N/A 08/04/2014   Procedure: EXPLORATORY LAPAROTOMY, REPAIR OF PERFORATED ULCER;  Surgeon: Excell Seltzer, MD;  [facility]: [facility];  Service: General;  Laterality: N/A;  . LAPAROTOMY N/A 12/21/2015   Procedure: EXPLORATORY LAPAROTOMY/ REMOVAL OF MESH;  Surgeon: Coralie Keens, MD;  Location: [facility];  Service: General;  Laterality: N/A;  . ORIF WRIST FRACTURE  2012   left  . REVERSE SHOULDER ARTHROPLASTY Right 12/04/2013  . REVERSE SHOULDER ARTHROPLASTY  12/04/2013   Procedure: REVERSE SHOULDER ARTHROPLASTY;  Surgeon: Nita Sells, MD;  Location: [facility] OR;  Service: Orthopedics;;  Right reverse total shoulder arthroplasty  . ROTATOR CUFF REPAIR     dr Tamera Punt  . SHOULDER ARTHROSCOPY  2012   lt and rt  . TENDON REPAIR Right 04/13/2014   Procedure: RIGHT HAND CENTRAL TENDON CENTRALIZATION OF LONG AND RING FINGERS;  Surgeon: Jolyn Nap, MD;  Location: [facility];  Service: Orthopedics;  Laterality: Right;    Social History   Socioeconomic History  . Marital status: Married    Spouse name: Not on file  . Number of children: Not on file  . Years of education: Not on file  . Highest education level: Not on file  Occupational History  . Not on file  Social Needs  . Financial resource strain: Not on file  . Food insecurity:    Worry: Not on file    Inability: Not on file  . Transportation needs:    Medical: Not on file    Non-medical: Not on file  Tobacco Use  . Smoking status: Never Smoker  . Smokeless tobacco: Never Used  Substance and Sexual Activity  . Alcohol use: Yes    Alcohol/week: 4.2 oz    Types: 7 Glasses of wine per week    Comment: 8 oz -12 oz of wine every   . Drug use: No  . Sexual activity: Not on file  Lifestyle  .  Physical activity:    Days per week: Not on file    Minutes per session: Not on file  . Stress: Not on file  Relationships  . Social connections:    Talks on phone: Not on file    Gets together: Not on file    Attends religious service: Not on file    Active member of club or organization: Not on file    Attends meetings of clubs or organizations: Not on file    Relationship status: Not on file  . Intimate partner violence:    Fear of current or ex partner: Not on file    Emotionally abused: Not on file    Physically abused: Not on file    Forced sexual activity: Not on file  Other Topics Concern  . Not on file  Social History Narrative  . Not on file   Family History  Problem Relation Age of Onset  . Stroke Mother   . Breast cancer  Sister   . Ulcerative colitis Daughter   . Chronic fatigue Daughter   . Chronic fatigue Daughter       VITAL SIGNS BP 110/78   Pulse 78   Temp (!) 96.1 F (35.6 C)   Resp 20   Ht 5\' 3"  (1.6 m)   Wt 171 lb 6.4 oz (77.7 kg)   SpO2 94%   BMI 30.36 kg/m   Outpatient Encounter Medications as of 07/18/2017  Medication Sig Note  . acetaminophen (TYLENOL) 325 MG tablet Take 325 mg by mouth every 6 (six) hours as needed for mild pain.    . benzonatate (TESSALON) 100 MG capsule Take 100 mg by mouth 3 (three) times daily as needed for cough. X 5 days then 1 capsule by mouth every 8 hours as needed   . bisacodyl (DULCOLAX) 10 MG suppository Place 1 suppository (10 mg total) rectally daily as needed for moderate constipation (May repeat times one).   Marland Kitchen desonide (DESOWEN) 0.05 % cream Apply to face topically as needed for rash/redness two times daily as needed   . diclofenac sodium (VOLTAREN) 1 % GEL Apply 1 g topically 2 (two) times daily. Apply to bilateral knees for pain   . divalproex (DEPAKOTE SPRINKLE) 125 MG capsule Give 4 Capsules (500 mg) by mouth at bedtime   . DULoxetine (CYMBALTA) 60 MG capsule Take 60 mg by mouth at bedtime.    . ENSURE (ENSURE) Take 120 mLs by mouth 2 (two) times daily.   . fluorometholone (FML) 0.1 % ophthalmic suspension Place 1 drop into both eyes 3 (three) times daily.   . fluticasone (FLONASE) 50 MCG/ACT nasal spray Place 1 spray into both nostrils at bedtime.   . gabapentin (NEURONTIN) 300 MG capsule Take 300 mg by mouth 2 (two) times daily.   Marland Kitchen guaiFENesin (MUCINEX) 600 MG 12 hr tablet Take 600 mg by mouth 2 (two) times daily.    Marland Kitchen ketotifen (ZADITOR) 0.025 % ophthalmic solution Place 1 drop into both eyes 2 (two) times daily as needed.   . loratadine (CLARITIN) 10 MG tablet Take 10 mg by mouth daily.   . magnesium hydroxide (MILK OF MAGNESIA) 400 MG/5ML suspension Take 60 mLs by mouth every other day. AS NEEDED FOR CONSTIPATION 08/04/2014: .   . methadone  (DOLOPHINE) 5 MG tablet Take 1 tablet (5 mg total) by mouth at bedtime.   . methocarbamol (ROBAXIN) 500 MG tablet Take 500 mg by mouth every 6 (six) hours as needed.    Marland Kitchen  Multiple Vitamin (MULTIVITAMIN) tablet Take 1 tablet by mouth 3 (three) times daily.    . Nutritional Supplements (PROMOD) LIQD Take 30 mLs by mouth 3 (three) times daily with meals.   . Omega-3 Fatty Acids (FISH OIL) 1000 MG CPDR Take 1,000 mg by mouth daily.    Marland Kitchen oxyCODONE (ROXICODONE) 5 MG immediate release tablet Take 1 tablet (5 mg total) by mouth every 4 (four) hours as needed for severe pain.   . OXYGEN Inhale into the lungs. 2-6 L/min to keep stat at 90 or above   . polyethylene glycol (MIRALAX / GLYCOLAX) packet Take 17 g by mouth 2 (two) times daily as needed for mild constipation.    . ranitidine (ZANTAC) 150 MG tablet Take 150 mg by mouth 2 (two) times daily.   . rivaroxaban (XARELTO) 20 MG TABS tablet Take 20 mg by mouth daily.    . temazepam (RESTORIL) 30 MG capsule Take 1 capsule (30 mg total) by mouth at bedtime.   . timolol (BETIMOL) 0.5 % ophthalmic solution Place 1 drop into both eyes 2 (two) times daily.    Marland Kitchen torsemide (DEMADEX) 20 MG tablet Take 2 tablets by mouth once daily    No facility-administered encounter medications on file as of 07/18/2017.      SIGNIFICANT DIAGNOSTIC EXAMS  PREVIOUS  01-20-16; chest x-ray: 1. Cardiomegaly and mild interstitial edema. 2. Improved aeration.  01-21-16: ct of abdomen and pelvis: 1. Apparent wall thickening at the rectum could reflect mild proctitis. 2. Small bilateral pleural effusions noted. Bibasilar airspace opacities may reflect atelectasis or possibly infection. 3. Large right-sided hiatal hernia, containing most of the stomach. 4. Mild soft tissue inflammation suggested about the bladder. Would correlate for any evidence of cystitis. 5. Nonspecific soft tissue inflammation suggested about the transverse colon. 6. Mild inflammation about the gallbladder is  nonspecific, given the underlying diffuse soft tissue inflammation about the abdomen. 7. Very large anterior abdominal wall defect again noted, with packing material and underlying soft tissue thickening. 8. Scattered diverticulosis along the distal sigmoid colon, without evidence of diverticulitis. 9. Scattered aortic atherosclerosis noted. 10. Right convex thoracolumbar scoliosis noted.   01-21-16 ct of neck: 1. Findings consistent with right parotiditis. No abscess identified. No significant lymphadenopathy. 2. Partially visualized small right pleural effusion and dependent right upper lobe opacity which may represent associated pneumonia or atelectasis.  07-03-16: right hip x-ray:No evidence of fracture or dislocation.   07-03-16: lumbar spine x-ray: No evidence of fracture or subluxation along the lumbar spine. Right convex thoracolumbar scoliosis again noted, with underlying degenerative change.  NO NEW EXAMS   LABS REVIEWED:PREVIOUS    08-07-16: wbc 11.0; hgb 10-28-16: glucose 97 bun 15.2; creat 0.46; k+ 4.3; na++ 132; ca 9.3; liver normal albumin 3.4  11-28-16:glucose 83; bun 11.0; creat 0.65; k+ 4.2; na++ 133; ca 9.7 05-21-17: wbc 5.6; hgb 11.5; hct 34.3; mcv 98.0 plt 208 glucose 101; bun 9.8; creat 0.41; k+ 4.3; na++ 131; liver normal albumin 2.9 hgb a1c 5.1; chol 147; ldl 74; trig 77; hdl 57; ammonia 104 depakote 41   NO NEW LABS.     Review of Systems  Constitutional: Negative for malaise/fatigue.  Respiratory: Negative for cough and shortness of breath.   Cardiovascular: Negative for chest pain, palpitations and leg swelling.  Gastrointestinal: Negative for abdominal pain, constipation and heartburn.  Musculoskeletal: Negative for back pain, joint pain and myalgias.  Skin: Negative.   Neurological: Negative for dizziness.  Psychiatric/Behavioral: The patient is not nervous/anxious.  Physical Exam  Constitutional: She is oriented to person, place, and time. She appears  well-developed and well-nourished. No distress.  Neck: No thyromegaly present.  Cardiovascular: Normal rate, regular rhythm and intact distal pulses.  Murmur heard. 1/6  Pulmonary/Chest: Effort normal and breath sounds normal. No respiratory distress.  Abdominal: Soft. Bowel sounds are normal. She exhibits no distension. There is no tenderness.  Musculoskeletal: She exhibits no edema.  Is able to move upper extremities   Lymphadenopathy:    She has no cervical adenopathy.  Neurological: She is alert and oriented to person, place, and time.  Skin: Skin is warm and dry. She is not diaphoretic.  Abdominal wound continues to resolve   Psychiatric: She has a normal mood and affect.    ASSESSMENT/ PLAN:  TODAY:   1. Pulmonary arterial hypertension: stable  b/p 110/78 is currently not on medications;  no changes will monitor   2. Chronic pain syndrome: has chronic bilateral low back pain without sciatica:  Is stable  is on chronic narcotic therapy: will continue methadone 5 mg NIGHTLY robaxin 500 mg four  times daily as needed; neurontin 300 mg twice daily   voltaren gel 1 gm twice daily to knees oxycodone 5 every 4 hours as needed   3.  Constipation due to opioid therapy: stable  will continue MOM 30 cc every other day as needed;  miralax  17 gm twice daily as needed will monitor   4. Surgical wound, non healing subsequent encounter Status post laparotomy without  removal of mesh: is improving; is now superficial. Is followed by wound dr. Aletha Halim monitor    PREVIOUS:    5. Insomnia: is stable will continue  restoril  30 mg nightly she has not tolerated lower doses    6. Deep vein thrombosis of femoral vein of both lower extremities :is stable  will continue xarelto 20 mg daily    7. CHF, chronic:EF 55-60% (06-21-14) is stable will continue demadex 40 mg daily uses 02 as neededt   8. Gerd: stable  will continue zantac 150 mg twice daily   9. Depression with anxiety:is stable  will  continue cymbalta 60 mg daily takes depakote 500 mg nightly to stabilize mood. Will continue to monitor her status.   10. Glaucoma: is without change  will continue timolol to both eyes twice daily fluorometholone 1 drop both eyes three times daily zaditor 1 drop both eyes twice daily as needed for allergies   11. Seasonal allergic rhinitis: is stable  will continue flonase daily claritin 10 mg daily  and mucinex twice  daily     MD is aware of resident's narcotic use and is in agreement with current plan of care. We will attempt to wean resident as apropriate   Ok Edwards NP [facility]  Contact [phone] Monday through Friday 8am- 5pm  After hours call [phone]

## 2017-08-09 DIAGNOSIS — F5101 Primary insomnia: Secondary | ICD-10-CM | POA: Diagnosis not present

## 2017-08-09 DIAGNOSIS — F419 Anxiety disorder, unspecified: Secondary | ICD-10-CM | POA: Diagnosis not present

## 2017-08-09 DIAGNOSIS — F331 Major depressive disorder, recurrent, moderate: Secondary | ICD-10-CM | POA: Diagnosis not present

## 2017-08-20 ENCOUNTER — Other Ambulatory Visit: Payer: Self-pay

## 2017-08-20 DIAGNOSIS — G894 Chronic pain syndrome: Secondary | ICD-10-CM

## 2017-08-20 DIAGNOSIS — G8929 Other chronic pain: Secondary | ICD-10-CM

## 2017-08-20 DIAGNOSIS — M545 Low back pain: Secondary | ICD-10-CM

## 2017-08-20 MED ORDER — METHADONE HCL 5 MG PO TABS: 5.0000 mg | ORAL_TABLET | Freq: Every day | ORAL | 0 refills | Status: DC

## 2017-08-20 NOTE — Telephone Encounter (Signed)
Rx faxed to [facility] (P) [phone], (F) [phone] 

## 2017-08-21 ENCOUNTER — Other Ambulatory Visit: Payer: Self-pay

## 2017-08-21 ENCOUNTER — Encounter: Payer: Self-pay | Admitting: Adult Health

## 2017-08-21 ENCOUNTER — Non-Acute Institutional Stay (SKILLED_NURSING_FACILITY): Payer: PPO | Admitting: Adult Health

## 2017-08-21 DIAGNOSIS — G4700 Insomnia, unspecified: Secondary | ICD-10-CM | POA: Diagnosis not present

## 2017-08-21 DIAGNOSIS — I82413 Acute embolism and thrombosis of femoral vein, bilateral: Secondary | ICD-10-CM

## 2017-08-21 DIAGNOSIS — G894 Chronic pain syndrome: Secondary | ICD-10-CM | POA: Diagnosis not present

## 2017-08-21 DIAGNOSIS — G8929 Other chronic pain: Secondary | ICD-10-CM

## 2017-08-21 DIAGNOSIS — I509 Heart failure, unspecified: Secondary | ICD-10-CM

## 2017-08-21 DIAGNOSIS — M545 Low back pain: Secondary | ICD-10-CM

## 2017-08-21 MED ORDER — METHADONE HCL 5 MG PO TABS: 5.0000 mg | ORAL_TABLET | Freq: Two times a day (BID) | ORAL | 0 refills | Status: DC

## 2017-08-21 MED ORDER — TEMAZEPAM 15 MG PO CAPS: 15.0000 mg | ORAL_CAPSULE | Freq: Every day | ORAL | 0 refills | Status: DC

## 2017-08-21 NOTE — Telephone Encounter (Signed)
Rx faxed to [facility] (P) [phone], (F) [phone] 

## 2017-08-21 NOTE — Progress Notes (Signed)
Location:   [facility] Room Number: 105 A Place of Service:  SNF (31)   CODE STATUS: DNR (Most form updated 12-20-16)  Allergies  Allergen Reactions  . Tape Other (See Comments)    SKIN IS VERY THIN; IT BRUISES AND TEARS EASILY!! -THX    Chief Complaint  Patient presents with  . Medical Management of Chronic Issues    Dvt; chf; chronic pain syndrome; insomnia.     HPI:  She is a 84 year old long term resident of this facility being seen for the management of her chronic illnesses: dvt; chf; chronic pain; and insomnia. She is complaining of uncontrolled back knee shoulder pain with muscle spasms. She states that the pain is keeping her from sleeping. There are no nursing concerns at this time.   Past Medical History:  Diagnosis Date  . Anemia   . Arthritis   . CHF (congestive heart failure) ([place])   . Depression   . GERD (gastroesophageal reflux disease)   . Hyponatremia 01/2016  . Perforated chronic gastric ulcer ([place]) 08/11/2014  . Pneumonia   . Pulmonary arterial hypertension ([place]) 11/28/2014    Past Surgical History:  Procedure Laterality Date  . ABDOMINAL HYSTERECTOMY    . ABDOMINAL SURGERY    . BACK SURGERY  2000  . COLON SURGERY     x2-blockage  . COLONOSCOPY    . ERCP    . HERNIA REPAIR  2012   x2 with mesh  . JOINT REPLACEMENT Left    shoulder  . LAPAROTOMY N/A 08/04/2014   Procedure: EXPLORATORY LAPAROTOMY, REPAIR OF PERFORATED ULCER;  Surgeon: Excell Seltzer, MD;  Location: [facility];  Service: General;  Laterality: N/A;  . LAPAROTOMY N/A 12/21/2015   Procedure: EXPLORATORY LAPAROTOMY/ REMOVAL OF MESH;  Surgeon: Coralie Keens, MD;  Location: [place];  Service: General;  Laterality: N/A;  . ORIF WRIST FRACTURE  2012   left  . REVERSE SHOULDER ARTHROPLASTY Right 12/04/2013  . REVERSE SHOULDER ARTHROPLASTY  12/04/2013   Procedure: REVERSE SHOULDER ARTHROPLASTY;  Surgeon: Nita Sells, MD;  Location: [facility] OR;  Service: Orthopedics;;   Right reverse total shoulder arthroplasty  . ROTATOR CUFF REPAIR     dr Tamera Punt  . SHOULDER ARTHROSCOPY  2012   lt and rt  . TENDON REPAIR Right 04/13/2014   Procedure: RIGHT HAND CENTRAL TENDON CENTRALIZATION OF LONG AND RING FINGERS;  Surgeon: Jolyn Nap, MD;  Location: [place];  Service: Orthopedics;  Laterality: Right;    Social History   Socioeconomic History  . Marital status: Married    Spouse name: Not on file  . Number of children: Not on file  . Years of education: Not on file  . Highest education level: Not on file  Occupational History  . Not on file  Social Needs  . Financial resource strain: Not on file  . Food insecurity:    Worry: Not on file    Inability: Not on file  . Transportation needs:    Medical: Not on file    Non-medical: Not on file  Tobacco Use  . Smoking status: Never Smoker  . Smokeless tobacco: Never Used  Substance and Sexual Activity  . Alcohol use: Yes    Alcohol/week: 4.2 oz    Types: 7 Glasses of wine per week    Comment: 8 oz -12 oz of wine every   . Drug use: No  . Sexual activity: Not on file  Lifestyle  . Physical  activity:    Days per week: Not on file    Minutes per session: Not on file  . Stress: Not on file  Relationships  . Social connections:    Talks on phone: Not on file    Gets together: Not on file    Attends religious service: Not on file    Active member of club or organization: Not on file    Attends meetings of clubs or organizations: Not on file    Relationship status: Not on file  . Intimate partner violence:    Fear of current or ex partner: Not on file    Emotionally abused: Not on file    Physically abused: Not on file    Forced sexual activity: Not on file  Other Topics Concern  . Not on file  Social History Narrative  . Not on file   Family History  Problem Relation Age of Onset  . Stroke Mother   . Breast cancer Sister   . Ulcerative colitis Daughter   . Chronic  fatigue Daughter   . Chronic fatigue Daughter       VITAL SIGNS BP 136/78   Pulse 78   Temp 98.1 F (36.7 C)   Resp 18   Ht 5\' 3"  (1.6 m)   Wt 171 lb 6.4 oz (77.7 kg)   SpO2 97%   BMI 30.36 kg/m   Outpatient Encounter Medications as of 08/21/2017  Medication Sig Note  . acetaminophen (TYLENOL) 325 MG tablet Take 325 mg by mouth every 6 (six) hours as needed for mild pain.    . benzonatate (TESSALON) 100 MG capsule Take 100 mg by mouth 3 (three) times daily as needed for cough. X 5 days then 1 capsule by mouth every 8 hours as needed   . bisacodyl (DULCOLAX) 10 MG suppository Place 1 suppository (10 mg total) rectally daily as needed for moderate constipation (May repeat times one).   Marland Kitchen desonide (DESOWEN) 0.05 % cream Apply to face topically as needed for rash/redness two times daily as needed   . diclofenac sodium (VOLTAREN) 1 % GEL Apply 1 g topically 2 (two) times daily. Apply to bilateral knees for pain   . divalproex (DEPAKOTE SPRINKLE) 125 MG capsule Give 4 Capsules (500 mg) by mouth at bedtime   . DULoxetine (CYMBALTA) 60 MG capsule Take 60 mg by mouth at bedtime.    . ENSURE (ENSURE) Take 120 mLs by mouth 2 (two) times daily.   . fluorometholone (FML) 0.1 % ophthalmic suspension Place 1 drop into both eyes 3 (three) times daily.   . fluticasone (FLONASE) 50 MCG/ACT nasal spray Place 1 spray into both nostrils at bedtime.   . gabapentin (NEURONTIN) 300 MG capsule Take 300 mg by mouth 2 (two) times daily.   Marland Kitchen guaiFENesin (MUCINEX) 600 MG 12 hr tablet Take 600 mg by mouth 2 (two) times daily.    Marland Kitchen ketotifen (ZADITOR) 0.025 % ophthalmic solution Place 1 drop into both eyes 2 (two) times daily as needed.   . loratadine (CLARITIN) 10 MG tablet Take 10 mg by mouth daily.   . magnesium hydroxide (MILK OF MAGNESIA) 400 MG/5ML suspension Take 60 mLs by mouth every other day. AS NEEDED FOR CONSTIPATION 08/04/2014: .   Marland Kitchen Melatonin 3 MG TABS Take 1 tablet by mouth at bedtime.   . methadone  (DOLOPHINE) 5 MG tablet Take 1 tablet (5 mg total) by mouth at bedtime.   . methocarbamol (ROBAXIN) 500 MG tablet Take 500 mg  by mouth every 6 (six) hours as needed.    . Multiple Vitamin (MULTIVITAMIN) tablet Take 1 tablet by mouth 3 (three) times daily.    . Omega-3 Fatty Acids (FISH OIL) 1000 MG CPDR Take 1,000 mg by mouth daily.    Marland Kitchen oxyCODONE (ROXICODONE) 5 MG immediate release tablet Take 1 tablet (5 mg total) by mouth every 4 (four) hours as needed for severe pain.   . OXYGEN Inhale into the lungs. 2-6 L/min to keep stat at 90 or above   . polyethylene glycol (MIRALAX / GLYCOLAX) packet Take 17 g by mouth 2 (two) times daily as needed for mild constipation.    . ranitidine (ZANTAC) 150 MG tablet Take 150 mg by mouth 2 (two) times daily.   . rivaroxaban (XARELTO) 20 MG TABS tablet Take 20 mg by mouth daily.    . temazepam (RESTORIL) 15 MG capsule Take 15 mg by mouth at bedtime as needed for sleep.   Marland Kitchen timolol (BETIMOL) 0.5 % ophthalmic solution Place 1 drop into both eyes 2 (two) times daily.    Marland Kitchen torsemide (DEMADEX) 20 MG tablet Take 2 tablets by mouth once daily   . [DISCONTINUED] Nutritional Supplements (PROMOD) LIQD Take 30 mLs by mouth 3 (three) times daily with meals.   . [DISCONTINUED] temazepam (RESTORIL) 30 MG capsule Take 1 capsule (30 mg total) by mouth at bedtime. (Patient not taking: Reported on 08/21/2017)    No facility-administered encounter medications on file as of 08/21/2017.      SIGNIFICANT DIAGNOSTIC EXAMS  PREVIOUS  01-20-16; chest x-ray: 1. Cardiomegaly and mild interstitial edema. 2. Improved aeration.  01-21-16: ct of abdomen and pelvis: 1. Apparent wall thickening at the rectum could reflect mild proctitis. 2. Small bilateral pleural effusions noted. Bibasilar airspace opacities may reflect atelectasis or possibly infection. 3. Large right-sided hiatal hernia, containing most of the stomach. 4. Mild soft tissue inflammation suggested about the bladder. Would  correlate for any evidence of cystitis. 5. Nonspecific soft tissue inflammation suggested about the transverse colon. 6. Mild inflammation about the gallbladder is nonspecific, given the underlying diffuse soft tissue inflammation about the abdomen. 7. Very large anterior abdominal wall defect again noted, with packing material and underlying soft tissue thickening. 8. Scattered diverticulosis along the distal sigmoid colon, without evidence of diverticulitis. 9. Scattered aortic atherosclerosis noted. 10. Right convex thoracolumbar scoliosis noted.   01-21-16 ct of neck: 1. Findings consistent with right parotiditis. No abscess identified. No significant lymphadenopathy. 2. Partially visualized small right pleural effusion and dependent right upper lobe opacity which may represent associated pneumonia or atelectasis.  07-03-16: right hip x-ray:No evidence of fracture or dislocation.   07-03-16: lumbar spine x-ray: No evidence of fracture or subluxation along the lumbar spine. Right convex thoracolumbar scoliosis again noted, with underlying degenerative change.  NO NEW EXAMS   LABS REVIEWED:PREVIOUS    11-28-16:glucose 83; bun 11.0; creat 0.65; k+ 4.2; na++ 133; ca 9.7 05-21-17: wbc 5.6; hgb 11.5; hct 34.3; mcv 98.0 plt 208 glucose 101; bun 9.8; creat 0.41; k+ 4.3; na++ 131; liver normal albumin 2.9 hgb a1c 5.1; chol 147; ldl 74; trig 77; hdl 57; ammonia 104 depakote 41   NO NEW LABS.     Review of Systems  Constitutional: Negative for malaise/fatigue.  Respiratory: Negative for cough and shortness of breath.   Cardiovascular: Negative for chest pain, palpitations and leg swelling.  Gastrointestinal: Negative for abdominal pain, constipation and heartburn.  Musculoskeletal: Positive for back pain and myalgias. Negative for joint  pain.       Has back knees and shoulder pain with muscle spasms in back   Skin: Negative.   Neurological: Negative for dizziness.  Psychiatric/Behavioral: The  patient is not nervous/anxious.     Physical Exam  Constitutional: She is oriented to person, place, and time. She appears well-developed and well-nourished. No distress.  Neck: No thyromegaly present.  Cardiovascular: Normal rate, regular rhythm and intact distal pulses.  Murmur heard. 1/6  Pulmonary/Chest: Effort normal and breath sounds normal. No respiratory distress.  Abdominal: Soft. Bowel sounds are normal.  Musculoskeletal: She exhibits no edema.  Is able to move upper extremities   Lymphadenopathy:    She has no cervical adenopathy.  Neurological: She is alert and oriented to person, place, and time.  Skin: Skin is warm and dry. She is not diaphoretic.  Psychiatric: She has a normal mood and affect.    ASSESSMENT/ PLAN:  TODAY:   1. Chronic pain syndrome: has chronic bilateral low back pain without sciatica:  Is not being adequately managed  is on chronic narcotic therapy: will continue  neurontin 300 mg twice daily   voltaren gel 1 gm twice daily to knees oxycodone 5 every 4 hours as needed  Will change methadone to 5 mg twice daily and robaxin 750 mg twice daily   2. Insomnia: is stable will continue  restoril  30 mg nightly she has not tolerated lower doses    3. Deep vein thrombosis of femoral vein of both lower extremities :is stable  will continue xarelto 20 mg daily    4. CHF, chronic:EF 55-60% (06-21-14) is stable will continue demadex 40 mg daily uses 02 as needed  PREVIOUS:    5. Gerd: stable  will continue zantac 150 mg twice daily   6. Depression with anxiety:is stable  will continue cymbalta 60 mg daily takes depakote 500 mg nightly to stabilize mood. Will continue to monitor her status.   7. Glaucoma: is without change  will continue timolol to both eyes twice daily fluorometholone 1 drop both eyes three times daily zaditor 1 drop both eyes twice daily as needed for allergies   8. Seasonal allergic rhinitis: is stable  will continue flonase daily claritin  10 mg daily  and mucinex twice  daily   9. Pulmonary arterial hypertension: stable  b/p 136/78 is currently not on medications;  no changes will monitor   10.  Constipation due to opioid therapy: stable  will continue MOM 30 cc every other day as needed;  miralax  17 gm twice daily as needed will monitor     MD is aware of resident's narcotic use and is in agreement with current plan of care. We will attempt to wean resident as apropriate   Ok Edwards NP [facility]  Contact [phone] Monday through Friday 8am- 5pm  After hours call [phone]

## 2017-08-22 DIAGNOSIS — I5030 Unspecified diastolic (congestive) heart failure: Secondary | ICD-10-CM | POA: Diagnosis not present

## 2017-08-22 DIAGNOSIS — M545 Low back pain: Secondary | ICD-10-CM | POA: Diagnosis not present

## 2017-08-22 DIAGNOSIS — I82409 Acute embolism and thrombosis of unspecified deep veins of unspecified lower extremity: Secondary | ICD-10-CM | POA: Diagnosis not present

## 2017-08-22 DIAGNOSIS — M6281 Muscle weakness (generalized): Secondary | ICD-10-CM | POA: Diagnosis not present

## 2017-08-28 DIAGNOSIS — T8189XS Other complications of procedures, not elsewhere classified, sequela: Secondary | ICD-10-CM | POA: Diagnosis not present

## 2017-09-05 DIAGNOSIS — Z79899 Other long term (current) drug therapy: Secondary | ICD-10-CM | POA: Diagnosis not present

## 2017-09-05 DIAGNOSIS — D649 Anemia, unspecified: Secondary | ICD-10-CM | POA: Diagnosis not present

## 2017-09-05 LAB — CBC AND DIFFERENTIAL
HCT: 34 — AB (ref 36–46)
Hemoglobin: 11.4 — AB (ref 12.0–16.0)
NEUTROS ABS: 4
PLATELETS: 234 (ref 150–399)
WBC: 6.7

## 2017-09-06 ENCOUNTER — Encounter: Payer: Self-pay | Admitting: Adult Health

## 2017-09-06 ENCOUNTER — Non-Acute Institutional Stay (SKILLED_NURSING_FACILITY): Payer: PPO | Admitting: Adult Health

## 2017-09-06 DIAGNOSIS — G4700 Insomnia, unspecified: Secondary | ICD-10-CM

## 2017-09-06 DIAGNOSIS — J302 Other seasonal allergic rhinitis: Secondary | ICD-10-CM

## 2017-09-06 NOTE — Progress Notes (Signed)
Location:   [facility] Room Number: 105 A Place of Service:  SNF (31)   CODE STATUS: DNR (Most form updated 12-20-16)  Allergies  Allergen Reactions  . Tape Other (See Comments)    SKIN IS VERY THIN; IT BRUISES AND TEARS EASILY!! -THX    Chief Complaint  Patient presents with  . Acute Visit    Multiple Complaints    HPI:  She has a cough that started yesterday with sinus congestion. She states that she is feeling better today. She is complaining of insomnia. There are no reports of fevers present. She has not tolerated having a lower dose of restoril. She has been on this medication long term.    Past Medical History:  Diagnosis Date  . Anemia   . Arthritis   . CHF (congestive heart failure) ([place])   . Depression   . GERD (gastroesophageal reflux disease)   . Hyponatremia 01/2016  . Perforated chronic gastric ulcer ([place]) 08/11/2014  . Pneumonia   . Pulmonary arterial hypertension ([place]) 11/28/2014    Past Surgical History:  Procedure Laterality Date  . ABDOMINAL HYSTERECTOMY    . ABDOMINAL SURGERY    . BACK SURGERY  2000  . COLON SURGERY     x2-blockage  . COLONOSCOPY    . ERCP    . HERNIA REPAIR  2012   x2 with mesh  . JOINT REPLACEMENT Left    shoulder  . LAPAROTOMY N/A 08/04/2014   Procedure: EXPLORATORY LAPAROTOMY, REPAIR OF PERFORATED ULCER;  Surgeon: Excell Seltzer, MD;  Location: [facility];  Service: General;  Laterality: N/A;  . LAPAROTOMY N/A 12/21/2015   Procedure: EXPLORATORY LAPAROTOMY/ REMOVAL OF MESH;  Surgeon: Coralie Keens, MD;  Location: [place];  Service: General;  Laterality: N/A;  . ORIF WRIST FRACTURE  2012   left  . REVERSE SHOULDER ARTHROPLASTY Right 12/04/2013  . REVERSE SHOULDER ARTHROPLASTY  12/04/2013   Procedure: REVERSE SHOULDER ARTHROPLASTY;  Surgeon: Nita Sells, MD;  Location: [facility] OR;  Service: Orthopedics;;  Right reverse total shoulder arthroplasty  . ROTATOR CUFF REPAIR     dr Tamera Punt  . SHOULDER  ARTHROSCOPY  2012   lt and rt  . TENDON REPAIR Right 04/13/2014   Procedure: RIGHT HAND CENTRAL TENDON CENTRALIZATION OF LONG AND RING FINGERS;  Surgeon: Jolyn Nap, MD;  Location: [place];  Service: Orthopedics;  Laterality: Right;    Social History   Socioeconomic History  . Marital status: Married    Spouse name: Not on file  . Number of children: Not on file  . Years of education: Not on file  . Highest education level: Not on file  Occupational History  . Not on file  Social Needs  . Financial resource strain: Not on file  . Food insecurity:    Worry: Not on file    Inability: Not on file  . Transportation needs:    Medical: Not on file    Non-medical: Not on file  Tobacco Use  . Smoking status: Never Smoker  . Smokeless tobacco: Never Used  Substance and Sexual Activity  . Alcohol use: Yes    Alcohol/week: 4.2 oz    Types: 7 Glasses of wine per week    Comment: 8 oz -12 oz of wine every   . Drug use: No  . Sexual activity: Not on file  Lifestyle  . Physical activity:    Days per week: Not on file    Minutes per session:  Not on file  . Stress: Not on file  Relationships  . Social connections:    Talks on phone: Not on file    Gets together: Not on file    Attends religious service: Not on file    Active member of club or organization: Not on file    Attends meetings of clubs or organizations: Not on file    Relationship status: Not on file  . Intimate partner violence:    Fear of current or ex partner: Not on file    Emotionally abused: Not on file    Physically abused: Not on file    Forced sexual activity: Not on file  Other Topics Concern  . Not on file  Social History Narrative  . Not on file   Family History  Problem Relation Age of Onset  . Stroke Mother   . Breast cancer Sister   . Ulcerative colitis Daughter   . Chronic fatigue Daughter   . Chronic fatigue Daughter       VITAL SIGNS BP 132/68   Pulse 87   Temp  98.1 F (36.7 C)   Resp 18   Ht 5\' 3"  (1.6 m)   Wt 171 lb 6.4 oz (77.7 kg)   SpO2 97%   BMI 30.36 kg/m   Outpatient Encounter Medications as of 09/06/2017  Medication Sig Note  . acetaminophen (TYLENOL) 325 MG tablet Take 325 mg by mouth every 6 (six) hours as needed for mild pain.    . benzonatate (TESSALON) 100 MG capsule Take 100 mg by mouth 3 (three) times daily as needed for cough. X 5 days then 1 capsule by mouth every 8 hours as needed   . bisacodyl (DULCOLAX) 10 MG suppository Place 1 suppository (10 mg total) rectally daily as needed for moderate constipation (May repeat times one).   Marland Kitchen desonide (DESOWEN) 0.05 % cream Apply to face topically as needed for rash/redness two times daily as needed   . diclofenac sodium (VOLTAREN) 1 % GEL Apply 1 g topically 2 (two) times daily. Apply to bilateral knees for pain   . divalproex (DEPAKOTE SPRINKLE) 125 MG capsule Give 4 Capsules (500 mg) by mouth at bedtime   . DULoxetine (CYMBALTA) 60 MG capsule Take 60 mg by mouth at bedtime.    . ENSURE (ENSURE) Take 120 mLs by mouth 2 (two) times daily.   . fluorometholone (FML) 0.1 % ophthalmic suspension Place 1 drop into both eyes 3 (three) times daily.   . fluticasone (FLONASE) 50 MCG/ACT nasal spray Place 1 spray into both nostrils at bedtime.   . gabapentin (NEURONTIN) 300 MG capsule Take 300 mg by mouth 2 (two) times daily.   Marland Kitchen guaiFENesin (MUCINEX) 600 MG 12 hr tablet Take 600 mg by mouth 2 (two) times daily.    Marland Kitchen ketotifen (ZADITOR) 0.025 % ophthalmic solution Place 1 drop into both eyes 2 (two) times daily as needed.   . loratadine (CLARITIN) 10 MG tablet Take 10 mg by mouth daily.   . magnesium hydroxide (MILK OF MAGNESIA) 400 MG/5ML suspension Take 60 mLs by mouth every other day. AS NEEDED FOR CONSTIPATION 08/04/2014: .   Marland Kitchen Melatonin 3 MG TABS Take 1 tablet by mouth at bedtime.   . methadone (DOLOPHINE) 5 MG tablet Take 1 tablet (5 mg total) by mouth every 12 (twelve) hours.   .  methocarbamol (ROBAXIN) 750 MG tablet Take 750 mg by mouth every 12 (twelve) hours.    . Multiple Vitamin (MULTIVITAMIN) tablet  Take 1 tablet by mouth 3 (three) times daily.    . Omega-3 Fatty Acids (FISH OIL) 1000 MG CPDR Take 1,000 mg by mouth daily.    Marland Kitchen oxyCODONE (ROXICODONE) 5 MG immediate release tablet Take 1 tablet (5 mg total) by mouth every 4 (four) hours as needed for severe pain.   . OXYGEN Inhale into the lungs. 2-6 L/min to keep stat at 90 or above   . polyethylene glycol (MIRALAX / GLYCOLAX) packet Take 17 g by mouth 2 (two) times daily.    . ranitidine (ZANTAC) 150 MG tablet Take 150 mg by mouth 2 (two) times daily.   . rivaroxaban (XARELTO) 20 MG TABS tablet Take 20 mg by mouth daily.    . temazepam (RESTORIL) 15 MG capsule Take 1 capsule (15 mg total) by mouth at bedtime.   . timolol (BETIMOL) 0.5 % ophthalmic solution Place 1 drop into both eyes 2 (two) times daily.    Marland Kitchen torsemide (DEMADEX) 20 MG tablet Take 2 tablets by mouth once daily    No facility-administered encounter medications on file as of 09/06/2017.      SIGNIFICANT DIAGNOSTIC EXAMS   PREVIOUS  01-20-16; chest x-ray: 1. Cardiomegaly and mild interstitial edema. 2. Improved aeration.  01-21-16: ct of abdomen and pelvis: 1. Apparent wall thickening at the rectum could reflect mild proctitis. 2. Small bilateral pleural effusions noted. Bibasilar airspace opacities may reflect atelectasis or possibly infection. 3. Large right-sided hiatal hernia, containing most of the stomach. 4. Mild soft tissue inflammation suggested about the bladder. Would correlate for any evidence of cystitis. 5. Nonspecific soft tissue inflammation suggested about the transverse colon. 6. Mild inflammation about the gallbladder is nonspecific, given the underlying diffuse soft tissue inflammation about the abdomen. 7. Very large anterior abdominal wall defect again noted, with packing material and underlying soft tissue thickening. 8.  Scattered diverticulosis along the distal sigmoid colon, without evidence of diverticulitis. 9. Scattered aortic atherosclerosis noted. 10. Right convex thoracolumbar scoliosis noted.   01-21-16 ct of neck: 1. Findings consistent with right parotiditis. No abscess identified. No significant lymphadenopathy. 2. Partially visualized small right pleural effusion and dependent right upper lobe opacity which may represent associated pneumonia or atelectasis.  07-03-16: right hip x-ray:No evidence of fracture or dislocation.   07-03-16: lumbar spine x-ray: No evidence of fracture or subluxation along the lumbar spine. Right convex thoracolumbar scoliosis again noted, with underlying degenerative change.  TODAY:   09-05-17: chest x-ray: no acute cardiopulmonary disease process; large paraesophageal hernia   LABS REVIEWED:PREVIOUS    11-28-16:glucose 83; bun 11.0; creat 0.65; k+ 4.2; na++ 133; ca 9.7 05-21-17: wbc 5.6; hgb 11.5; hct 34.3; mcv 98.0 plt 208 glucose 101; bun 9.8; creat 0.41; k+ 4.3; na++ 131; liver normal albumin 2.9 hgb a1c 5.1; chol 147; ldl 74; trig 77; hdl 57; ammonia 104 depakote 41   TODAY:   09-05-17: wbc 6.7; hgb 11.4; hct 33.9; mcv 100.6; plt 234    Review of Systems  Constitutional: Negative for malaise/fatigue.  HENT: Positive for congestion and sinus pain.   Respiratory: Negative for cough and shortness of breath.   Cardiovascular: Negative for chest pain, palpitations and leg swelling.  Gastrointestinal: Negative for abdominal pain, constipation and heartburn.  Musculoskeletal: Negative for back pain, joint pain and myalgias.  Skin: Negative.   Neurological: Negative for dizziness.  Psychiatric/Behavioral: The patient has insomnia. The patient is not nervous/anxious.     Physical Exam  Constitutional: She is oriented to person, place, and time.  She appears well-developed and well-nourished. No distress.  Neck: No thyromegaly present.  Cardiovascular: Normal rate,  regular rhythm and intact distal pulses.  Murmur heard. 1/6  Pulmonary/Chest: Effort normal and breath sounds normal. No respiratory distress.  Abdominal: Soft. Bowel sounds are normal. She exhibits no distension. There is no tenderness.  Musculoskeletal: She exhibits edema.  Is able to move upper extremities 1+ bilateral lower extremity edema   Lymphadenopathy:    She has no cervical adenopathy.  Neurological: She is alert and oriented to person, place, and time.  Skin: Skin is warm and dry. She is not diaphoretic.  Psychiatric: She has a normal mood and affect.     ASSESSMENT/ PLAN:  TODAY:   1. Insomnia 2. Seasonal allergic rhinitis  Will stop tessalon perle Increase flonase twice daily  Start sudafed 30 mg every 6 hours as needed Increase restoril to 30 mg nightly    MD is aware of resident's narcotic use and is in agreement with current plan of care. We will attempt to wean resident as apropriate   Ok Edwards NP [facility]  Contact [phone] Monday through Friday 8am- 5pm  After hours call [phone]

## 2017-09-10 ENCOUNTER — Other Ambulatory Visit: Payer: Self-pay

## 2017-09-10 MED ORDER — TEMAZEPAM 30 MG PO CAPS: 30.0000 mg | ORAL_CAPSULE | Freq: Every evening | ORAL | 0 refills | Status: DC | PRN

## 2017-09-10 NOTE — Telephone Encounter (Signed)
Rx faxed to [facility] (P) [phone], (F) [phone] 

## 2017-09-12 ENCOUNTER — Non-Acute Institutional Stay (SKILLED_NURSING_FACILITY): Payer: PPO | Admitting: Adult Health

## 2017-09-12 ENCOUNTER — Encounter: Payer: Self-pay | Admitting: Adult Health

## 2017-09-12 DIAGNOSIS — I509 Heart failure, unspecified: Secondary | ICD-10-CM

## 2017-09-12 DIAGNOSIS — L03115 Cellulitis of right lower limb: Secondary | ICD-10-CM | POA: Diagnosis not present

## 2017-09-12 NOTE — Progress Notes (Signed)
Location:   [facility] Room Number: 105 A Place of Service:  SNF (31)   CODE STATUS: DNR (Most form updated 12-20-16)   Allergies  Allergen Reactions  . Tape Other (See Comments)    SKIN IS VERY THIN; IT BRUISES AND TEARS EASILY!! -THX    Chief Complaint  Patient presents with  . Acute Visit    Cellulitis    HPI:  Staff reports that she has on her right outer thigh; and bilateral inner thighs areas that are red and hot to touch. There are reports of fevers present. She does have pain to palpation. She does not know when the areas became red and inflamed. She is complaining of worsening edema in her lower extremities. She has been declining medications periodically as her medications have to given in apple sauce since she was found to be hoarding her medications. She does not like having her medications in apple sauce.    Past Medical History:  Diagnosis Date  . Anemia   . Arthritis   . CHF (congestive heart failure) ([place])   . Depression   . GERD (gastroesophageal reflux disease)   . Hyponatremia 01/2016  . Perforated chronic gastric ulcer ([place]) 08/11/2014  . Pneumonia   . Pulmonary arterial hypertension ([place]) 11/28/2014    Past Surgical History:  Procedure Laterality Date  . ABDOMINAL HYSTERECTOMY    . ABDOMINAL SURGERY    . BACK SURGERY  2000  . COLON SURGERY     x2-blockage  . COLONOSCOPY    . ERCP    . HERNIA REPAIR  2012   x2 with mesh  . JOINT REPLACEMENT Left    shoulder  . LAPAROTOMY N/A 08/04/2014   Procedure: EXPLORATORY LAPAROTOMY, REPAIR OF PERFORATED ULCER;  Surgeon: Excell Seltzer, MD;  Location: [facility];  Service: General;  Laterality: N/A;  . LAPAROTOMY N/A 12/21/2015   Procedure: EXPLORATORY LAPAROTOMY/ REMOVAL OF MESH;  Surgeon: Coralie Keens, MD;  Location: [place];  Service: General;  Laterality: N/A;  . ORIF WRIST FRACTURE  2012   left  . REVERSE SHOULDER ARTHROPLASTY Right 12/04/2013  . REVERSE SHOULDER ARTHROPLASTY   12/04/2013   Procedure: REVERSE SHOULDER ARTHROPLASTY;  Surgeon: Nita Sells, MD;  Location: [facility] OR;  Service: Orthopedics;;  Right reverse total shoulder arthroplasty  . ROTATOR CUFF REPAIR     dr Tamera Punt  . SHOULDER ARTHROSCOPY  2012   lt and rt  . TENDON REPAIR Right 04/13/2014   Procedure: RIGHT HAND CENTRAL TENDON CENTRALIZATION OF LONG AND RING FINGERS;  Surgeon: Jolyn Nap, MD;  Location: [place];  Service: Orthopedics;  Laterality: Right;    Social History   Socioeconomic History  . Marital status: Married    Spouse name: Not on file  . Number of children: Not on file  . Years of education: Not on file  . Highest education level: Not on file  Occupational History  . Not on file  Social Needs  . Financial resource strain: Not on file  . Food insecurity:    Worry: Not on file    Inability: Not on file  . Transportation needs:    Medical: Not on file    Non-medical: Not on file  Tobacco Use  . Smoking status: Never Smoker  . Smokeless tobacco: Never Used  Substance and Sexual Activity  . Alcohol use: Yes    Alcohol/week: 4.2 oz    Types: 7 Glasses of wine per week    Comment:  8 oz -12 oz of wine every   . Drug use: No  . Sexual activity: Not on file  Lifestyle  . Physical activity:    Days per week: Not on file    Minutes per session: Not on file  . Stress: Not on file  Relationships  . Social connections:    Talks on phone: Not on file    Gets together: Not on file    Attends religious service: Not on file    Active member of club or organization: Not on file    Attends meetings of clubs or organizations: Not on file    Relationship status: Not on file  . Intimate partner violence:    Fear of current or ex partner: Not on file    Emotionally abused: Not on file    Physically abused: Not on file    Forced sexual activity: Not on file  Other Topics Concern  . Not on file  Social History Narrative  . Not on file    Family History  Problem Relation Age of Onset  . Stroke Mother   . Breast cancer Sister   . Ulcerative colitis Daughter   . Chronic fatigue Daughter   . Chronic fatigue Daughter       VITAL SIGNS BP 132/68   Pulse 87   Temp 98.1 F (36.7 C)   Resp 18   Ht 5\' 3"  (1.6 m)   Wt 171 lb 6.4 oz (77.7 kg)   SpO2 98%   BMI 30.36 kg/m   Outpatient Encounter Medications as of 09/12/2017  Medication Sig Note  . acetaminophen (TYLENOL) 325 MG tablet Take 325 mg by mouth every 6 (six) hours as needed for mild pain.    . bisacodyl (DULCOLAX) 10 MG suppository Place 1 suppository (10 mg total) rectally daily as needed for moderate constipation (May repeat times one).   Marland Kitchen desonide (DESOWEN) 0.05 % cream Apply to face topically as needed for rash/redness two times daily as needed   . diclofenac sodium (VOLTAREN) 1 % GEL Apply 1 g topically 2 (two) times daily. Apply to bilateral knees for pain   . divalproex (DEPAKOTE SPRINKLE) 125 MG capsule Give 4 Capsules (500 mg) by mouth at bedtime   . DULOXETINE HCL PO Take 90 mg by mouth at bedtime.    . ENSURE (ENSURE) Take 120 mLs by mouth 2 (two) times daily.   . fluorometholone (FML) 0.1 % ophthalmic suspension Place 1 drop into both eyes 3 (three) times daily.   . fluticasone (FLONASE) 50 MCG/ACT nasal spray Place 1 spray into both nostrils at bedtime.   . gabapentin (NEURONTIN) 300 MG capsule Take 300 mg by mouth 2 (two) times daily.   Marland Kitchen guaiFENesin (MUCINEX) 600 MG 12 hr tablet Take 600 mg by mouth 2 (two) times daily.    Marland Kitchen ketotifen (ZADITOR) 0.025 % ophthalmic solution Place 1 drop into both eyes 2 (two) times daily as needed.   . loratadine (CLARITIN) 10 MG tablet Take 10 mg by mouth daily.   . magnesium hydroxide (MILK OF MAGNESIA) 400 MG/5ML suspension Take 60 mLs by mouth every other day. AS NEEDED FOR CONSTIPATION 08/04/2014: .   Marland Kitchen Melatonin 3 MG TABS Take 1 tablet by mouth at bedtime.   . methadone (DOLOPHINE) 5 MG tablet Take 1 tablet  (5 mg total) by mouth every 12 (twelve) hours.   . methocarbamol (ROBAXIN) 750 MG tablet Take 750 mg by mouth every 12 (twelve) hours.    Marland Kitchen  Multiple Vitamin (MULTIVITAMIN) tablet Take 1 tablet by mouth 3 (three) times daily.    . Omega-3 Fatty Acids (FISH OIL) 1000 MG CPDR Take 1,000 mg by mouth daily.    Marland Kitchen oxyCODONE (ROXICODONE) 5 MG immediate release tablet Take 1 tablet (5 mg total) by mouth every 4 (four) hours as needed for severe pain.   . OXYGEN Inhale into the lungs. 2-6 L/min to keep stat at 90 or above   . polyethylene glycol (MIRALAX / GLYCOLAX) packet Take 17 g by mouth 2 (two) times daily.    . pseudoephedrine (SUDAFED) 30 MG tablet Take 30 mg by mouth every 6 (six) hours as needed for congestion.   . ranitidine (ZANTAC) 150 MG tablet Take 150 mg by mouth 2 (two) times daily.   . rivaroxaban (XARELTO) 20 MG TABS tablet Take 20 mg by mouth daily.    . temazepam (RESTORIL) 30 MG capsule Take 1 capsule (30 mg total) by mouth at bedtime as needed for sleep.   Marland Kitchen timolol (BETIMOL) 0.5 % ophthalmic solution Place 1 drop into both eyes 2 (two) times daily.    Marland Kitchen torsemide (DEMADEX) 20 MG tablet Take 2 tablets by mouth once daily   . benzonatate (TESSALON) 100 MG capsule Take 100 mg by mouth 3 (three) times daily as needed for cough. X 5 days then 1 capsule by mouth every 8 hours as needed    No facility-administered encounter medications on file as of 09/12/2017.      SIGNIFICANT DIAGNOSTIC EXAMS   PREVIOUS  01-20-16; chest x-ray: 1. Cardiomegaly and mild interstitial edema. 2. Improved aeration.  01-21-16: ct of abdomen and pelvis: 1. Apparent wall thickening at the rectum could reflect mild proctitis. 2. Small bilateral pleural effusions noted. Bibasilar airspace opacities may reflect atelectasis or possibly infection. 3. Large right-sided hiatal hernia, containing most of the stomach. 4. Mild soft tissue inflammation suggested about the bladder. Would correlate for any evidence of  cystitis. 5. Nonspecific soft tissue inflammation suggested about the transverse colon. 6. Mild inflammation about the gallbladder is nonspecific, given the underlying diffuse soft tissue inflammation about the abdomen. 7. Very large anterior abdominal wall defect again noted, with packing material and underlying soft tissue thickening. 8. Scattered diverticulosis along the distal sigmoid colon, without evidence of diverticulitis. 9. Scattered aortic atherosclerosis noted. 10. Right convex thoracolumbar scoliosis noted.   01-21-16 ct of neck: 1. Findings consistent with right parotiditis. No abscess identified. No significant lymphadenopathy. 2. Partially visualized small right pleural effusion and dependent right upper lobe opacity which may represent associated pneumonia or atelectasis.  07-03-16: right hip x-ray:No evidence of fracture or dislocation.   07-03-16: lumbar spine x-ray: No evidence of fracture or subluxation along the lumbar spine. Right convex thoracolumbar scoliosis again noted, with underlying degenerative change.  TODAY:   09-05-17: chest x-ray: no acute cardiopulmonary disease process; large paraesophageal hernia   LABS REVIEWED:PREVIOUS    11-28-16:glucose 83; bun 11.0; creat 0.65; k+ 4.2; na++ 133; ca 9.7 05-21-17: wbc 5.6; hgb 11.5; hct 34.3; mcv 98.0 plt 208 glucose 101; bun 9.8; creat 0.41; k+ 4.3; na++ 131; liver normal albumin 2.9 hgb a1c 5.1; chol 147; ldl 74; trig 77; hdl 57; ammonia 104 depakote 41   TODAY:   09-05-17: wbc 6.7; hgb 11.4; hct 33.9; mcv 100.6; plt 234    Review of Systems  Constitutional: Negative for malaise/fatigue.  Respiratory: Negative for cough and shortness of breath.   Cardiovascular: Positive for leg swelling. Negative for chest pain and  palpitations.  Gastrointestinal: Negative for abdominal pain, constipation and heartburn.  Musculoskeletal: Negative for back pain, joint pain and myalgias.  Skin:       Right thigh is inflamed     Neurological: Negative for dizziness.  Psychiatric/Behavioral: The patient is not nervous/anxious.       Physical Exam  Constitutional: She is oriented to person, place, and time. She appears well-developed and well-nourished. No distress.  Neck: No thyromegaly present.  Cardiovascular: Normal rate, regular rhythm and intact distal pulses.  Murmur heard. 1/6  Pulmonary/Chest: Effort normal and breath sounds normal. No respiratory distress.  Abdominal: Soft. Bowel sounds are normal. She exhibits no distension. There is no tenderness.  Musculoskeletal: She exhibits edema.  Is able to move upper extremities 1+ bilateral lower extremity edema    Lymphadenopathy:    She has no cervical adenopathy.  Neurological: She is alert and oriented to person, place, and time.  Skin: Skin is warm and dry. She is not diaphoretic.  Right outer thigh is red; hot and tender to touch   Psychiatric: She has a normal mood and affect.     ASSESSMENT/ PLAN:  TODAY:   1. Chronic chf 2. Right thigh cellulitis   Will begin augmentin 875 mg twice daily for 14 days with probiotic Will increase demadex 40 twice daily for 5 days then return to one time daily   MD is aware of resident's narcotic use and is in agreement with current plan of care. We will attempt to wean resident as apropriate   Ok Edwards NP [facility]  Contact [phone] Monday through Friday 8am- 5pm  After hours call [phone]

## 2017-09-13 ENCOUNTER — Non-Acute Institutional Stay (SKILLED_NURSING_FACILITY): Payer: PPO

## 2017-09-13 DIAGNOSIS — Z0000 Encounter for general adult medical examination without abnormal findings: Secondary | ICD-10-CM

## 2017-09-13 NOTE — Progress Notes (Signed)
Subjective:   Monique Russo is a 84 y.o. female who presents for Medicare Annual (Subsequent) preventive examination at [place]  Last AWV-09/11/2016    Objective:     Vitals: BP 130/65 (BP Location: Right Arm, Patient Position: Supine)   Pulse 85   Temp 98 F (36.7 C) (Oral)   Ht 5\' 3"  (1.6 m)   Wt 171 lb (77.6 kg)   BMI 30.29 kg/m   Body mass index is 30.29 kg/m.  Advanced Directives 09/13/2017 09/12/2017 09/06/2017 08/21/2017 07/18/2017 06/21/2017 06/19/2017  Does Patient Have a Medical Advance Directive? Yes Yes Yes Yes Yes Yes Yes  Type of Advance Directive Out of facility DNR (pink MOST or yellow form) Out of facility DNR (pink MOST or yellow form) Out of facility DNR (pink MOST or yellow form) Out of facility DNR (pink MOST or yellow form) Out of facility DNR (pink MOST or yellow form) Out of facility DNR (pink MOST or yellow form) -  Does patient want to make changes to medical advance directive? No - Patient declined No - Patient declined No - Patient declined No - Patient declined No - Patient declined No - Patient declined No - Patient declined  Copy of [place] in [place]  Would patient like information on creating a medical advance directive? No - Patient declined No - Patient declined No - Patient declined No - Patient declined No - Patient declined No - Patient declined No - Patient declined  Pre-existing out of facility DNR order (yellow form or pink MOST form) Yellow form placed in chart (order not valid for inpatient use);Pink MOST form placed in chart (order not valid for inpatient use) Yellow form placed in chart (order not valid for inpatient use);Pink MOST form placed in chart (order not valid for inpatient use) Yellow form placed in chart (order not valid for inpatient use);Pink MOST form placed in chart (order not valid for inpatient use) Yellow form placed in chart (order not valid for inpatient use);Pink MOST form  placed in chart (order not valid for inpatient use) Yellow form placed in chart (order not valid for inpatient use);Pink MOST form placed in chart (order not valid for inpatient use) Yellow form placed in chart (order not valid for inpatient use);Pink MOST form placed in chart (order not valid for inpatient use) Yellow form placed in chart (order not valid for inpatient use);Pink MOST form placed in chart (order not valid for inpatient use)    Tobacco Social History   Tobacco Use  Smoking Status Never Smoker  Smokeless Tobacco Never Used     Counseling given: Not Answered   Clinical Intake:  Pre-visit preparation completed: No  Pain : 0-10 Pain Score: 6  Pain Type: Chronic pain Pain Location: Back Pain Descriptors / Indicators: Aching Pain Onset: More than a month ago Pain Frequency: Constant     Nutritional Risks: None Diabetes: No  How often do you need to have someone help you when you read instructions, pamphlets, or other written materials from your doctor or pharmacy?: 2 - Rarely  Interpreter Needed?: No  Information entered by :: Tyson Dense, RN  Past Medical History:  Diagnosis Date  . Anemia   . Arthritis   . CHF (congestive heart failure) ([place])   . Depression   . GERD (gastroesophageal reflux disease)   . Hyponatremia 01/2016  . Perforated chronic gastric ulcer ([place]) 08/11/2014  . Pneumonia   .  Pulmonary arterial hypertension ([place]) 11/28/2014   Past Surgical History:  Procedure Laterality Date  . ABDOMINAL HYSTERECTOMY    . ABDOMINAL SURGERY    . BACK SURGERY  2000  . COLON SURGERY     x2-blockage  . COLONOSCOPY    . ERCP    . HERNIA REPAIR  2012   x2 with mesh  . JOINT REPLACEMENT Left    shoulder  . LAPAROTOMY N/A 08/04/2014   Procedure: EXPLORATORY LAPAROTOMY, REPAIR OF PERFORATED ULCER;  Surgeon: Excell Seltzer, MD;  Location: [facility];  Service: General;  Laterality: N/A;  . LAPAROTOMY N/A 12/21/2015   Procedure: EXPLORATORY LAPAROTOMY/  REMOVAL OF MESH;  Surgeon: Coralie Keens, MD;  Location: [place];  Service: General;  Laterality: N/A;  . ORIF WRIST FRACTURE  2012   left  . REVERSE SHOULDER ARTHROPLASTY Right 12/04/2013  . REVERSE SHOULDER ARTHROPLASTY  12/04/2013   Procedure: REVERSE SHOULDER ARTHROPLASTY;  Surgeon: Nita Sells, MD;  Location: [facility] OR;  Service: Orthopedics;;  Right reverse total shoulder arthroplasty  . ROTATOR CUFF REPAIR     dr Tamera Punt  . SHOULDER ARTHROSCOPY  2012   lt and rt  . TENDON REPAIR Right 04/13/2014   Procedure: RIGHT HAND CENTRAL TENDON CENTRALIZATION OF LONG AND RING FINGERS;  Surgeon: Jolyn Nap, MD;  Location: [place];  Service: Orthopedics;  Laterality: Right;   Family History  Problem Relation Age of Onset  . Stroke Mother   . Breast cancer Sister   . Ulcerative colitis Daughter   . Chronic fatigue Daughter   . Chronic fatigue Daughter    Social History   Socioeconomic History  . Marital status: Married    Spouse name: Not on file  . Number of children: Not on file  . Years of education: Not on file  . Highest education level: Not on file  Occupational History  . Not on file  Social Needs  . Financial resource strain: Not hard at all  . Food insecurity:    Worry: Never true    Inability: Never true  . Transportation needs:    Medical: No    Non-medical: No  Tobacco Use  . Smoking status: Never Smoker  . Smokeless tobacco: Never Used  Substance and Sexual Activity  . Alcohol use: Not Currently    Alcohol/week: 4.2 oz    Types: 7 Glasses of wine per week    Comment: 8 oz -12 oz of wine every   . Drug use: No  . Sexual activity: Not on file  Lifestyle  . Physical activity:    Days per week: 0 days    Minutes per session: 0 min  . Stress: To some extent  Relationships  . Social connections:    Talks on phone: More than three times a week    Gets together: Twice a week    Attends religious service: Never    Active member  of club or organization: No    Attends meetings of clubs or organizations: Never    Relationship status: Married  Other Topics Concern  . Not on file  Social History Narrative  . Not on file    Outpatient Encounter Medications as of 09/13/2017  Medication Sig  . acetaminophen (TYLENOL) 325 MG tablet Take 325 mg by mouth every 6 (six) hours as needed for mild pain.   . benzonatate (TESSALON) 100 MG capsule Take 100 mg by mouth 3 (three) times daily as needed for cough. X 5 days  then 1 capsule by mouth every 8 hours as needed  . bisacodyl (DULCOLAX) 10 MG suppository Place 1 suppository (10 mg total) rectally daily as needed for moderate constipation (May repeat times one).  Marland Kitchen desonide (DESOWEN) 0.05 % cream Apply to face topically as needed for rash/redness two times daily as needed  . diclofenac sodium (VOLTAREN) 1 % GEL Apply 1 g topically 2 (two) times daily. Apply to bilateral knees for pain  . divalproex (DEPAKOTE SPRINKLE) 125 MG capsule Give 4 Capsules (500 mg) by mouth at bedtime  . DULOXETINE HCL PO Take 90 mg by mouth at bedtime.   . ENSURE (ENSURE) Take 120 mLs by mouth 2 (two) times daily.  . fluorometholone (FML) 0.1 % ophthalmic suspension Place 1 drop into both eyes 3 (three) times daily.  . fluticasone (FLONASE) 50 MCG/ACT nasal spray Place 1 spray into both nostrils at bedtime.  . gabapentin (NEURONTIN) 300 MG capsule Take 300 mg by mouth 2 (two) times daily.  Marland Kitchen guaiFENesin (MUCINEX) 600 MG 12 hr tablet Take 600 mg by mouth 2 (two) times daily.   Marland Kitchen ketotifen (ZADITOR) 0.025 % ophthalmic solution Place 1 drop into both eyes 2 (two) times daily as needed.  . loratadine (CLARITIN) 10 MG tablet Take 10 mg by mouth daily.  . magnesium hydroxide (MILK OF MAGNESIA) 400 MG/5ML suspension Take 60 mLs by mouth every other day. AS NEEDED FOR CONSTIPATION  . Melatonin 3 MG TABS Take 1 tablet by mouth at bedtime.  . methadone (DOLOPHINE) 5 MG tablet Take 1 tablet (5 mg total) by mouth  every 12 (twelve) hours.  . methocarbamol (ROBAXIN) 750 MG tablet Take 750 mg by mouth every 12 (twelve) hours.   . Multiple Vitamin (MULTIVITAMIN) tablet Take 1 tablet by mouth 3 (three) times daily.   . Omega-3 Fatty Acids (FISH OIL) 1000 MG CPDR Take 1,000 mg by mouth daily.   Marland Kitchen oxyCODONE (ROXICODONE) 5 MG immediate release tablet Take 1 tablet (5 mg total) by mouth every 4 (four) hours as needed for severe pain.  . OXYGEN Inhale into the lungs. 2-6 L/min to keep stat at 90 or above  . polyethylene glycol (MIRALAX / GLYCOLAX) packet Take 17 g by mouth 2 (two) times daily.   . pseudoephedrine (SUDAFED) 30 MG tablet Take 30 mg by mouth every 6 (six) hours as needed for congestion.  . ranitidine (ZANTAC) 150 MG tablet Take 150 mg by mouth 2 (two) times daily.  . rivaroxaban (XARELTO) 20 MG TABS tablet Take 20 mg by mouth daily.   . temazepam (RESTORIL) 30 MG capsule Take 1 capsule (30 mg total) by mouth at bedtime as needed for sleep.  Marland Kitchen timolol (BETIMOL) 0.5 % ophthalmic solution Place 1 drop into both eyes 2 (two) times daily.   Marland Kitchen torsemide (DEMADEX) 20 MG tablet Take 2 tablets by mouth once daily   No facility-administered encounter medications on file as of 09/13/2017.     Activities of Daily Living In your present state of health, do you have any difficulty performing the following activities: 09/13/2017  Hearing? N  Vision? N  Difficulty concentrating or making decisions? N  Walking or climbing stairs? Y  Dressing or bathing? Y  Doing errands, shopping? Y  Preparing Food and eating ? Y  Using the Toilet? Y  In the past six months, have you accidently leaked urine? N  Do you have problems with loss of bowel control? N  Managing your Medications? Y  Managing your Finances? Darreld Mclean  Housekeeping or managing your Housekeeping? Y  Some recent data might be hidden    Patient Care Team: Gildardo Cranker, DO as PCP - General (Internal Medicine) Nyoka Cowden Phylis Bougie, NP as Nurse Practitioner  ([place], [place] ([place])    Assessment:   This is a routine wellness examination for Ange.  Exercise Activities and Dietary recommendations Current Exercise Habits: The patient does not participate in regular exercise at present, Exercise limited by: orthopedic condition(s)  Goals    None      Fall Risk Fall Risk  09/13/2017 09/11/2016  Falls in the past year? No No   Is the patient's home free of loose throw rugs in walkways, pet beds, electrical cords, etc?   yes      Grab bars in the bathroom? yes      Handrails on the stairs?   yes      Adequate lighting?   yes  Depression Screen PHQ 2/9 Scores 09/13/2017 09/11/2016  PHQ - 2 Score 6 3  PHQ- 9 Score 9 3     Cognitive Function     6CIT Screen 09/13/2017 09/11/2016  What Year? 0 points 0 points  What month? 0 points 0 points  What time? 0 points 0 points  Count back from 20 0 points 0 points  Months in reverse 0 points 0 points  Repeat phrase 0 points 0 points  Total Score 0 0    Immunization History  Administered Date(s) Administered  . Influenza Split 02/03/2015  . Influenza-Unspecified 01/18/2017    Qualifies for Shingles Vaccine? Not in past records  Screening Tests Health Maintenance  Topic Date Due  . INFLUENZA VACCINE  10/18/2017  . DEXA SCAN  Discontinued  . TETANUS/TDAP  Discontinued  . PNA vac Low Risk Adult  Discontinued    Cancer Screenings: Lung: Low Dose CT Chest recommended if Age 46-80 years, 30 pack-year currently smoking OR have quit w/in 15years. Patient does not qualify. Breast:  Up to date on Mammogram? Yes  Up to date of Bone Density/Dexa? Excluded Colorectal: up to date  Additional Screenings:  Hepatitis C Screening: declined     Plan:    I have personally reviewed and addressed the Medicare Annual Wellness questionnaire and have noted the following in the patient's chart:  A. Medical and social history B. Use of alcohol,  tobacco or illicit drugs  C. Current medications and supplements D. Functional ability and status E.  Nutritional status F.  Physical activity G. Advance directives H. List of other physicians I.  Hospitalizations, surgeries, and ER visits in previous 12 months J.  [place] to include hearing, vision, cognitive, depression L. Referrals and appointments - none  In addition, I have reviewed and discussed with patient certain preventive protocols, quality metrics, and best practice recommendations. A written personalized care plan for preventive services as well as general preventive health recommendations were provided to patient.  See attached scanned questionnaire for additional information.   Signed,   Tyson Dense, RN Nurse Health Advisor  Patient Concerns: None

## 2017-09-13 NOTE — Patient Instructions (Signed)
[name] , Thank you for taking time to come for your Medicare Wellness Visit. I appreciate your ongoing commitment to your health goals. Please review the following plan we discussed and let me know if I can assist you in the future.   Screening recommendations/referrals: Colonoscopy excluded, over age 82 Mammogram excluded, over age 52 Bone Density excluded Recommended yearly ophthalmology/optometry visit for glaucoma screening and checkup Recommended yearly dental visit for hygiene and checkup  Vaccinations: Influenza vaccine up to date, due 2019 fall season Pneumococcal vaccine excluded Tdap vaccine excluded Shingles vaccine not in past records    Advanced directives: in chart  Conditions/risks identified: none  Next appointment: Dr. [name] makes rounds   Preventive Care 65 Years and Older, Female Preventive care refers to lifestyle choices and visits with your health care provider that can promote health and wellness. What does preventive care include?  A yearly physical exam. This is also called an annual well check.  Dental exams once or twice a year.  Routine eye exams. Ask your health care provider how often you should have your eyes checked.  Personal lifestyle choices, including:  Daily care of your teeth and gums.  Regular physical activity.  Eating a healthy diet.  Avoiding tobacco and drug use.  Limiting alcohol use.  Practicing safe sex.  Taking low-dose aspirin every day.  Taking vitamin and mineral supplements as recommended by your health care provider. What happens during an annual well check? The services and screenings done by your health care provider during your annual well check will depend on your age, overall health, lifestyle risk factors, and family history of disease. Counseling  Your health care provider may ask you questions about your:  Alcohol use.  Tobacco use.  Drug use.  Emotional well-being.  Home and relationship  well-being.  Sexual activity.  Eating habits.  History of falls.  Memory and ability to understand (cognition).  Work and work Statistician.  Reproductive health. Screening  You may have the following tests or measurements:  Height, weight, and BMI.  Blood pressure.  Lipid and cholesterol levels. These may be checked every 5 years, or more frequently if you are over 74 years old.  Skin check.  Lung cancer screening. You may have this screening every year starting at age 60 if you have a 30-pack-year history of smoking and currently smoke or have quit within the past 15 years.  Fecal occult blood test (FOBT) of the stool. You may have this test every year starting at age 105.  Flexible sigmoidoscopy or colonoscopy. You may have a sigmoidoscopy every 5 years or a colonoscopy every 10 years starting at age 56.  Hepatitis C blood test.  Hepatitis B blood test.  Sexually transmitted disease (STD) testing.  Diabetes screening. This is done by checking your blood sugar (glucose) after you have not eaten for a while (fasting). You may have this done every 1-3 years.  Bone density scan. This is done to screen for osteoporosis. You may have this done starting at age 9.  Mammogram. This may be done every 1-2 years. Talk to your health care provider about how often you should have regular mammograms. Talk with your health care provider about your test results, treatment options, and if necessary, the need for more tests. Vaccines  Your health care provider may recommend certain vaccines, such as:  Influenza vaccine. This is recommended every year.  Tetanus, diphtheria, and acellular pertussis (Tdap, Td) vaccine. You may need a Td booster every  10 years.  Zoster vaccine. You may need this after age 76.  Pneumococcal 13-valent conjugate (PCV13) vaccine. One dose is recommended after age 82.  Pneumococcal polysaccharide (PPSV23) vaccine. One dose is recommended after age  87. Talk to your health care provider about which screenings and vaccines you need and how often you need them. This information is not intended to replace advice given to you by your health care provider. Make sure you discuss any questions you have with your health care provider. Document Released: 04/02/2015 Document Revised: 11/24/2015 Document Reviewed: 01/05/2015 Elsevier Interactive Patient Education  2017 [place] Prevention in the Home Falls can cause injuries. They can happen to people of all ages. There are many things you can do to make your home safe and to help prevent falls. What can I do on the outside of my home?  Regularly fix the edges of walkways and driveways and fix any cracks.  Remove anything that might make you trip as you walk through a door, such as a raised step or threshold.  Trim any bushes or trees on the path to your home.  Use bright outdoor lighting.  Clear any walking paths of anything that might make someone trip, such as rocks or tools.  Regularly check to see if handrails are loose or broken. Make sure that both sides of any steps have handrails.  Any raised decks and porches should have guardrails on the edges.  Have any leaves, snow, or ice cleared regularly.  Use sand or salt on walking paths during winter.  Clean up any spills in your garage right away. This includes oil or grease spills. What can I do in the bathroom?  Use night lights.  Install grab bars by the toilet and in the tub and shower. Do not use towel bars as grab bars.  Use non-skid mats or decals in the tub or shower.  If you need to sit down in the shower, use a plastic, non-slip stool.  Keep the floor dry. Clean up any water that spills on the floor as soon as it happens.  Remove soap buildup in the tub or shower regularly.  Attach bath mats securely with double-sided non-slip rug tape.  Do not have throw rugs and other things on the floor that can make  you trip. What can I do in the bedroom?  Use night lights.  Make sure that you have a light by your bed that is easy to reach.  Do not use any sheets or blankets that are too big for your bed. They should not hang down onto the floor.  Have a firm chair that has side arms. You can use this for support while you get dressed.  Do not have throw rugs and other things on the floor that can make you trip. What can I do in the kitchen?  Clean up any spills right away.  Avoid walking on wet floors.  Keep items that you use a lot in easy-to-reach places.  If you need to reach something above you, use a strong step stool that has a grab bar.  Keep electrical cords out of the way.  Do not use floor polish or wax that makes floors slippery. If you must use wax, use non-skid floor wax.  Do not have throw rugs and other things on the floor that can make you trip. What can I do with my stairs?  Do not leave any items on the stairs.  Make sure  that there are handrails on both sides of the stairs and use them. Fix handrails that are broken or loose. Make sure that handrails are as long as the stairways.  Check any carpeting to make sure that it is firmly attached to the stairs. Fix any carpet that is loose or worn.  Avoid having throw rugs at the top or bottom of the stairs. If you do have throw rugs, attach them to the floor with carpet tape.  Make sure that you have a light switch at the top of the stairs and the bottom of the stairs. If you do not have them, ask someone to add them for you. What else can I do to help prevent falls?  Wear shoes that:  Do not have high heels.  Have rubber bottoms.  Are comfortable and fit you well.  Are closed at the toe. Do not wear sandals.  If you use a stepladder:  Make sure that it is fully opened. Do not climb a closed stepladder.  Make sure that both sides of the stepladder are locked into place.  Ask someone to hold it for you, if  possible.  Clearly mark and make sure that you can see:  Any grab bars or handrails.  First and last steps.  Where the edge of each step is.  Use tools that help you move around (mobility aids) if they are needed. These include:  Canes.  Walkers.  Scooters.  Crutches.  Turn on the lights when you go into a dark area. Replace any light bulbs as soon as they burn out.  Set up your furniture so you have a clear path. Avoid moving your furniture around.  If any of your floors are uneven, fix them.  If there are any pets around you, be aware of where they are.  Review your medicines with your doctor. Some medicines can make you feel dizzy. This can increase your chance of falling. Ask your doctor what other things that you can do to help prevent falls. This information is not intended to replace advice given to you by your health care provider. Make sure you discuss any questions you have with your health care provider. Document Released: 12/31/2008 Document Revised: 08/12/2015 Document Reviewed: 04/10/2014 Elsevier Interactive Patient Education  2017 [employer].

## 2017-09-15 DIAGNOSIS — L03115 Cellulitis of right lower limb: Secondary | ICD-10-CM | POA: Insufficient documentation

## 2017-09-20 DIAGNOSIS — F419 Anxiety disorder, unspecified: Secondary | ICD-10-CM | POA: Diagnosis not present

## 2017-09-20 DIAGNOSIS — F331 Major depressive disorder, recurrent, moderate: Secondary | ICD-10-CM | POA: Diagnosis not present

## 2017-09-20 DIAGNOSIS — F5101 Primary insomnia: Secondary | ICD-10-CM | POA: Diagnosis not present

## 2017-09-21 ENCOUNTER — Encounter: Payer: Self-pay | Admitting: Adult Health

## 2017-09-21 ENCOUNTER — Non-Acute Institutional Stay (SKILLED_NURSING_FACILITY): Payer: PPO | Admitting: Adult Health

## 2017-09-21 DIAGNOSIS — J302 Other seasonal allergic rhinitis: Secondary | ICD-10-CM

## 2017-09-21 DIAGNOSIS — I509 Heart failure, unspecified: Secondary | ICD-10-CM | POA: Diagnosis not present

## 2017-09-21 DIAGNOSIS — T402X5A Adverse effect of other opioids, initial encounter: Secondary | ICD-10-CM | POA: Diagnosis not present

## 2017-09-21 DIAGNOSIS — I82513 Chronic embolism and thrombosis of femoral vein, bilateral: Secondary | ICD-10-CM

## 2017-09-21 DIAGNOSIS — K219 Gastro-esophageal reflux disease without esophagitis: Secondary | ICD-10-CM

## 2017-09-21 DIAGNOSIS — K5903 Drug induced constipation: Secondary | ICD-10-CM

## 2017-09-21 DIAGNOSIS — F418 Other specified anxiety disorders: Secondary | ICD-10-CM | POA: Diagnosis not present

## 2017-09-21 DIAGNOSIS — G894 Chronic pain syndrome: Secondary | ICD-10-CM

## 2017-09-21 NOTE — Progress Notes (Signed)
Provider:  Ok Edwards, NP Location:  [facility] Room Number: 105 A Place of Service:  SNF (31)   PCP: Gildardo Cranker, DO Patient Care Team: Gildardo Cranker, DO as PCP - General (Internal Medicine) Nyoka Cowden Phylis Bougie, NP as Nurse Practitioner ([facility]) [facility], [place] ([facility])  Extended Emergency Contact Information Primary Emergency Contact: Bare,Bill Address: [address]          [place], [place] [address] Johnnette Litter of [place] Phone: [phone] Mobile Phone: [phone] Relation: Spouse Secondary Emergency Contact: Faythe Ghee Address: [address]          [place], [place] [address] Johnnette Litter of [place] Phone: [phone] Mobile Phone: [phone] Relation: Sister  Code Status: DNR (Most form updated 12-20-16)  Goals of Care: Advanced Directive information Advanced Directives 09/21/2017  Does Patient Have a Medical Advance Directive? Yes  Type of Advance Directive Out of facility DNR (pink MOST or yellow form)  Does patient want to make changes to medical advance directive? No - Patient declined  Copy of [place] in Chart? -  Would patient like information on creating a medical advance directive? No - Patient declined  Pre-existing out of facility DNR order (yellow form or pink MOST form) Yellow form placed in chart (order not valid for inpatient use);Pink MOST form placed in chart (order not valid for inpatient use)      Allergies  Allergen Reactions  . Tape Other (See Comments)    SKIN IS VERY THIN; IT BRUISES AND TEARS EASILY!! -THX     Chief Complaint  Patient presents with  . Annual Exam    Dvt; chf; allergic rhinitis     HPI: Patient is a 84 y.o. female seen today for an annual comprehensive examination. She has not required any hospitalizations over the past year. She is due to see the pain management clinic on 10-02-17 for possible epidural injection. She states  that this upcoming injection will relieve her pain. She denies any changes in appetite; no worsening insomnia; no sinus congestion. She is presently being treated for left thigh cellulitis which is improving. She continues to be followed for her chronic illnesses including: dvt; chf; allergic rhinitis. There are no nursing concerns at this time.   Past Medical History:  Diagnosis Date  . Anemia   . Arthritis   . CHF (congestive heart failure) ([facility])   . Depression   . GERD (gastroesophageal reflux disease)   . Hyponatremia 01/2016  . Perforated chronic gastric ulcer ([facility]) 08/11/2014  . Pneumonia   . Pulmonary arterial hypertension ([facility]) 11/28/2014   Past Surgical History:  Procedure Laterality Date  . ABDOMINAL HYSTERECTOMY    . ABDOMINAL SURGERY    . BACK SURGERY  2000  . COLON SURGERY     x2-blockage  . COLONOSCOPY    . ERCP    . HERNIA REPAIR  2012   x2 with mesh  . JOINT REPLACEMENT Left    shoulder  . LAPAROTOMY N/A 08/04/2014   Procedure: EXPLORATORY LAPAROTOMY, REPAIR OF PERFORATED ULCER;  Surgeon: Excell Seltzer, MD;  Location: [facility];  Service: General;  Laterality: N/A;  . LAPAROTOMY N/A 12/21/2015   Procedure: EXPLORATORY LAPAROTOMY/ REMOVAL OF MESH;  Surgeon: Coralie Keens, MD;  Location: [facility];  Service: General;  Laterality: N/A;  . ORIF WRIST FRACTURE  2012   left  . REVERSE SHOULDER ARTHROPLASTY Right 12/04/2013  . REVERSE SHOULDER ARTHROPLASTY  12/04/2013   Procedure: REVERSE SHOULDER  ARTHROPLASTY;  Surgeon: Nita Sells, MD;  Location: [facility];  Service: Orthopedics;;  Right reverse total shoulder arthroplasty  . ROTATOR CUFF REPAIR     dr Tamera Punt  . SHOULDER ARTHROSCOPY  2012   lt and rt  . TENDON REPAIR Right 04/13/2014   Procedure: RIGHT HAND CENTRAL TENDON CENTRALIZATION OF LONG AND RING FINGERS;  Surgeon: Jolyn Nap, MD;  Location: [facility];  Service: Orthopedics;  Laterality: Right;    reports that she has never  smoked. She has never used smokeless tobacco. She reports that she drank about 4.2 oz of alcohol per week. She reports that she does not use drugs. Social History   Socioeconomic History  . Marital status: Married    Spouse name: Not on file  . Number of children: Not on file  . Years of education: Not on file  . Highest education level: Not on file  Occupational History  . Not on file  Social Needs  . Financial resource strain: Not hard at all  . Food insecurity:    Worry: Never true    Inability: Never true  . Transportation needs:    Medical: No    Non-medical: No  Tobacco Use  . Smoking status: Never Smoker  . Smokeless tobacco: Never Used  Substance and Sexual Activity  . Alcohol use: Not Currently    Alcohol/week: 4.2 oz    Types: 7 Glasses of wine per week    Comment: 8 oz -12 oz of wine every   . Drug use: No  . Sexual activity: Not on file  Lifestyle  . Physical activity:    Days per week: 0 days    Minutes per session: 0 min  . Stress: To some extent  Relationships  . Social connections:    Talks on phone: More than three times a week    Gets together: Twice a week    Attends religious service: Never    Active member of club or organization: No    Attends meetings of clubs or organizations: Never    Relationship status: Married  . Intimate partner violence:    Fear of current or ex partner: No    Emotionally abused: No    Physically abused: No    Forced sexual activity: No  Other Topics Concern  . Not on file  Social History Narrative  . Not on file   Family History  Problem Relation Age of Onset  . Stroke Mother   . Breast cancer Sister   . Ulcerative colitis Daughter   . Chronic fatigue Daughter   . Chronic fatigue Daughter     Vitals:   09/21/17 0949  BP: 135/70  Pulse: 86  Resp: 20  Temp: 97.9 F (36.6 C)  SpO2: 97%  Weight: 171 lb 6.4 oz (77.7 kg)  Height: 5\' 3"  (1.6 m)   Body mass index is 30.36 kg/m.  Allergies as of 09/21/2017       Reactions   Tape Other (See Comments)   SKIN IS VERY THIN; IT BRUISES AND TEARS EASILY!! -THX      Medication List        Accurate as of 09/21/17 10:30 AM. Always use your most recent med list.          acetaminophen 325 MG tablet Commonly known as:  TYLENOL Take 325 mg by mouth every 6 (six) hours as needed for mild pain.   amoxicillin-clavulanate 875-125 MG tablet Commonly known as:  AUGMENTIN  Take 1 tablet by mouth 2 (two) times daily. X 14 days   bisacodyl 10 MG suppository Commonly known as:  DULCOLAX Place 1 suppository (10 mg total) rectally daily as needed for moderate constipation (May repeat times one).   desonide 0.05 % cream Commonly known as:  DESOWEN Apply to face topically as needed for rash/redness two times daily as needed   diclofenac sodium 1 % Gel Commonly known as:  VOLTAREN Apply 1 g topically 2 (two) times daily. Apply to bilateral knees for pain   divalproex 125 MG capsule Commonly known as:  DEPAKOTE SPRINKLE Give 4 Capsules (500 mg) by mouth at bedtime   DULOXETINE HCL PO Take 90 mg by mouth at bedtime.   ENSURE Take 120 mLs by mouth 2 (two) times daily.   Fish Oil 1000 MG Cpdr Take 1,000 mg by mouth daily.   fluorometholone 0.1 % ophthalmic suspension Commonly known as:  FML Place 1 drop into both eyes 3 (three) times daily.   fluticasone 50 MCG/ACT nasal spray Commonly known as:  FLONASE Place 1 spray into both nostrils at bedtime.   gabapentin 300 MG capsule Commonly known as:  NEURONTIN Take 300 mg by mouth 2 (two) times daily.   guaiFENesin 600 MG 12 hr tablet Commonly known as:  MUCINEX Take 600 mg by mouth 2 (two) times daily.   ketotifen 0.025 % ophthalmic solution Commonly known as:  ZADITOR Place 1 drop into both eyes 2 (two) times daily as needed.   loratadine 10 MG tablet Commonly known as:  CLARITIN Take 10 mg by mouth daily.   magnesium hydroxide 400 MG/5ML suspension Commonly known as:  MILK OF  MAGNESIA Take 60 mLs by mouth every other day. AS NEEDED FOR CONSTIPATION   Melatonin 3 MG Tabs Take 2 tablets by mouth at bedtime.   methadone 5 MG tablet Commonly known as:  DOLOPHINE Take 1 tablet (5 mg total) by mouth every 12 (twelve) hours.   methocarbamol 750 MG tablet Commonly known as:  ROBAXIN Take 750 mg by mouth every 12 (twelve) hours.   multivitamin tablet Take 1 tablet by mouth 3 (three) times daily.   oxyCODONE 5 MG immediate release tablet Commonly known as:  ROXICODONE Take 1 tablet (5 mg total) by mouth every 4 (four) hours as needed for severe pain.   OXYGEN Inhale into the lungs. 2-6 L/min to keep stat at 90 or above   polyethylene glycol packet Commonly known as:  MIRALAX / GLYCOLAX Take 17 g by mouth 2 (two) times daily.   pseudoephedrine 30 MG tablet Commonly known as:  SUDAFED Take 30 mg by mouth every 6 (six) hours as needed for congestion.   ranitidine 150 MG tablet Commonly known as:  ZANTAC Take 150 mg by mouth 2 (two) times daily.   rivaroxaban 20 MG Tabs tablet Commonly known as:  XARELTO Take 20 mg by mouth daily.   saccharomyces boulardii 250 MG capsule Commonly known as:  FLORASTOR Take 250 mg by mouth 2 (two) times daily. X 14 days   temazepam 30 MG capsule Commonly known as:  RESTORIL Take 1 capsule (30 mg total) by mouth at bedtime as needed for sleep.   timolol 0.5 % ophthalmic solution Commonly known as:  BETIMOL Place 1 drop into both eyes 2 (two) times daily.   torsemide 20 MG tablet Commonly known as:  DEMADEX Take 2 tablets by mouth once daily        SIGNIFICANT DIAGNOSTIC EXAMS  PREVIOUS  01-21-16: ct of abdomen and pelvis: 1. Apparent wall thickening at the rectum could reflect mild proctitis. 2. Small bilateral pleural effusions noted. Bibasilar airspace opacities may reflect atelectasis or possibly infection. 3. Large right-sided hiatal hernia, containing most of the stomach. 4. Mild soft tissue  inflammation suggested about the bladder. Would correlate for any evidence of cystitis. 5. Nonspecific soft tissue inflammation suggested about the transverse colon. 6. Mild inflammation about the gallbladder is nonspecific, given the underlying diffuse soft tissue inflammation about the abdomen. 7. Very large anterior abdominal wall defect again noted, with packing material and underlying soft tissue thickening. 8. Scattered diverticulosis along the distal sigmoid colon, without evidence of diverticulitis. 9. Scattered aortic atherosclerosis noted. 10. Right convex thoracolumbar scoliosis noted.   01-21-16 ct of neck: 1. Findings consistent with right parotiditis. No abscess identified. No significant lymphadenopathy. 2. Partially visualized small right pleural effusion and dependent right upper lobe opacity which may represent associated pneumonia or atelectasis.  07-03-16: right hip x-ray:No evidence of fracture or dislocation.   07-03-16: lumbar spine x-ray: No evidence of fracture or subluxation along the lumbar spine. Right convex thoracolumbar scoliosis again noted, with underlying degenerative change.  09-05-17: chest x-ray: no acute cardiopulmonary disease process; large paraesophageal hernia  NO NEW EXAMS    LABS REVIEWED:PREVIOUS    11-28-16:glucose 83; bun 11.0; creat 0.65; k+ 4.2; na++ 133; ca 9.7 05-21-17: wbc 5.6; hgb 11.5; hct 34.3; mcv 98.0 plt 208 glucose 101; bun 9.8; creat 0.41; k+ 4.3; na++ 131; liver normal albumin 2.9 hgb a1c 5.1; chol 147; ldl 74; trig 77; hdl 57; ammonia 104 depakote 41  09-05-17: wbc 6.7; hgb 11.4; hct 33.9; mcv 100.6; plt 234   NO NEW LABS.    Review of Systems  Constitutional: Negative for malaise/fatigue.  Respiratory: Negative for cough and shortness of breath.   Cardiovascular: Negative for chest pain, palpitations and leg swelling.  Gastrointestinal: Negative for abdominal pain, constipation and heartburn.  Musculoskeletal: Positive for  joint pain. Negative for back pain and myalgias.       Has bilateral knee pain   Skin: Negative.   Neurological: Negative for dizziness.  Psychiatric/Behavioral: The patient is not nervous/anxious.     Physical Exam  Constitutional: She is oriented to person, place, and time. She appears well-developed and well-nourished. No distress.  HENT:  Mouth/Throat: Oropharynx is clear and moist.  Eyes: Conjunctivae are normal.  Neck: No thyromegaly present.  Cardiovascular: Normal rate, regular rhythm and intact distal pulses.  Murmur heard. 1/6  Pulmonary/Chest: Effort normal and breath sounds normal. No respiratory distress.  Abdominal: Soft. Bowel sounds are normal. She exhibits no distension. There is no tenderness.  Musculoskeletal: She exhibits no edema.  Is able to move upper extremities 1+ bilateral lower extremity edema     Lymphadenopathy:    She has no cervical adenopathy.  Neurological: She is alert and oriented to person, place, and time.  Skin: Skin is warm and dry. She is not diaphoretic.  Abdominal surgical wound: 2.5 x 9 cm  Psychiatric: She has a normal mood and affect.    ASSESSMENT/ PLAN:  TODAY:   1. Chronic pain syndrome: has chronic bilateral low back pain without sciatica:  Is not being adequately managed  is on chronic narcotic therapy: will continue  neurontin 300 mg twice daily   voltaren gel 1 gm twice daily to knees oxycodone 5 every 4 hours as needed  Will change methadone to 5 mg twice daily and robaxin 750 mg twice daily  2. Insomnia: is stable will continue  restoril  30 mg nightly she has not tolerated lower doses    3. Deep vein thrombosis of femoral vein of both lower extremities :is stable  will continue xarelto 20 mg daily    4. CHF, chronic:EF 55-60% (06-21-14) is stable will continue demadex 40 mg daily uses 02 as needed  5. Gerd: stable  will continue zantac 150 mg twice daily   6. Depression with anxiety:is stable  will continue cymbalta 90  mg daily takes depakote 500 mg nightly to stabilize mood. Will continue to monitor her status.   7. Glaucoma: is without change  will continue timolol to both eyes twice daily fluorometholone 1 drop both eyes three times daily zaditor 1 drop both eyes twice daily as needed for allergies   8. Seasonal allergic rhinitis: is stable  will continue flonase daily claritin 10 mg daily  and mucinex twice  daily   9. Pulmonary arterial hypertension: stable  b/p 135/70 is currently not on medications;  no changes will monitor   10.  Constipation due to opioid therapy: stable  will continue MOM 30 cc every other day as needed;  miralax  17 gm twice daily as needed will monitor    Her health maintenance is up to date       MD is aware of resident's narcotic use and is in agreement with current plan of care. We will wean dosage as appropriate for resident   Ok Edwards NP [facility]  Contact [phone] Monday through Friday 8am- 5pm  After hours call [phone]

## 2017-09-27 DIAGNOSIS — K219 Gastro-esophageal reflux disease without esophagitis: Secondary | ICD-10-CM | POA: Diagnosis not present

## 2017-09-27 DIAGNOSIS — E43 Unspecified severe protein-calorie malnutrition: Secondary | ICD-10-CM | POA: Diagnosis not present

## 2017-09-27 DIAGNOSIS — I5032 Chronic diastolic (congestive) heart failure: Secondary | ICD-10-CM | POA: Diagnosis not present

## 2017-09-27 DIAGNOSIS — R1311 Dysphagia, oral phase: Secondary | ICD-10-CM | POA: Diagnosis not present

## 2017-09-28 ENCOUNTER — Encounter: Payer: Self-pay | Admitting: Adult Health

## 2017-09-28 DIAGNOSIS — T8189XS Other complications of procedures, not elsewhere classified, sequela: Secondary | ICD-10-CM | POA: Diagnosis not present

## 2017-09-28 NOTE — Progress Notes (Signed)
Entered in error

## 2017-10-01 ENCOUNTER — Other Ambulatory Visit (HOSPITAL_COMMUNITY): Payer: Self-pay | Admitting: Internal Medicine

## 2017-10-01 DIAGNOSIS — R1310 Dysphagia, unspecified: Secondary | ICD-10-CM

## 2017-10-04 DIAGNOSIS — F331 Major depressive disorder, recurrent, moderate: Secondary | ICD-10-CM | POA: Diagnosis not present

## 2017-10-04 DIAGNOSIS — F5101 Primary insomnia: Secondary | ICD-10-CM | POA: Diagnosis not present

## 2017-10-05 DIAGNOSIS — D649 Anemia, unspecified: Secondary | ICD-10-CM | POA: Diagnosis not present

## 2017-10-05 DIAGNOSIS — Z79899 Other long term (current) drug therapy: Secondary | ICD-10-CM | POA: Diagnosis not present

## 2017-10-05 LAB — BASIC METABOLIC PANEL
BUN: 11 (ref 4–21)
Creatinine: 0.5 (ref 0.5–1.1)
Glucose: 120
Potassium: 3.5 (ref 3.4–5.3)
Sodium: 138 (ref 137–147)

## 2017-10-10 ENCOUNTER — Ambulatory Visit (HOSPITAL_COMMUNITY): Payer: PPO

## 2017-10-10 ENCOUNTER — Encounter (HOSPITAL_COMMUNITY): Payer: Self-pay

## 2017-10-10 ENCOUNTER — Ambulatory Visit (HOSPITAL_COMMUNITY)
Admission: RE | Admit: 2017-10-10 | Discharge: 2017-10-10 | Disposition: A | Discharge status: Home / Self Care | Payer: PPO | Source: Ambulatory Visit | Attending: Internal Medicine | Admitting: Internal Medicine

## 2017-10-10 ENCOUNTER — Ambulatory Visit (HOSPITAL_COMMUNITY): Admission: RE | Admit: 2017-10-10 | Payer: Self-pay | Source: Ambulatory Visit

## 2017-10-11 DIAGNOSIS — M6281 Muscle weakness (generalized): Secondary | ICD-10-CM | POA: Diagnosis not present

## 2017-10-12 DIAGNOSIS — F331 Major depressive disorder, recurrent, moderate: Secondary | ICD-10-CM | POA: Diagnosis not present

## 2017-10-12 DIAGNOSIS — F5101 Primary insomnia: Secondary | ICD-10-CM | POA: Diagnosis not present

## 2017-10-17 ENCOUNTER — Encounter: Payer: Self-pay | Admitting: Adult Health

## 2017-10-17 ENCOUNTER — Other Ambulatory Visit (HOSPITAL_COMMUNITY): Payer: Self-pay | Admitting: Internal Medicine

## 2017-10-17 ENCOUNTER — Other Ambulatory Visit: Payer: Self-pay

## 2017-10-17 ENCOUNTER — Non-Acute Institutional Stay (SKILLED_NURSING_FACILITY): Payer: PPO | Admitting: Adult Health

## 2017-10-17 DIAGNOSIS — T402X5A Adverse effect of other opioids, initial encounter: Secondary | ICD-10-CM

## 2017-10-17 DIAGNOSIS — G894 Chronic pain syndrome: Secondary | ICD-10-CM | POA: Diagnosis not present

## 2017-10-17 DIAGNOSIS — K5903 Drug induced constipation: Secondary | ICD-10-CM

## 2017-10-17 DIAGNOSIS — I509 Heart failure, unspecified: Secondary | ICD-10-CM

## 2017-10-17 DIAGNOSIS — R1310 Dysphagia, unspecified: Secondary | ICD-10-CM

## 2017-10-17 MED ORDER — METHADONE HCL 5 MG PO TABS: 5.0000 mg | ORAL_TABLET | Freq: Two times a day (BID) | ORAL | 0 refills | Status: DC

## 2017-10-17 MED ORDER — TEMAZEPAM 30 MG PO CAPS: 30.0000 mg | ORAL_CAPSULE | Freq: Every evening | ORAL | 0 refills | Status: DC | PRN

## 2017-10-17 NOTE — Telephone Encounter (Signed)
Rx faxed to [facility] (P) [phone], (F) [phone] 

## 2017-10-17 NOTE — Progress Notes (Signed)
Location:   [facility] Room Number: 105 A Place of Service:  SNF (31)   CODE STATUS: DNR (Most form updated 12-20-16)  Allergies  Allergen Reactions  . Tape Other (See Comments)    SKIN IS VERY THIN; IT BRUISES AND TEARS EASILY!! -THX    Chief Complaint  Patient presents with  . Acute Visit    Patient concerns    HPI:  She has concerns along with her sister. She is being moved to the second floor. She does not like her medications in applesauce. We did discuss that she was hoarding her pills; and for her safety her medications will continue to be put in applesauce. She states she is unable to decline medications with this method. I will speak with nursing about asking her what she wants to take before placing them in the applesauce; she agrees. She wants to stop her fish oil. She continues to have chronic back pain. She denies any constipation or choking with meals.    Past Medical History:  Diagnosis Date  . Anemia   . Arthritis   . CHF (congestive heart failure) ([place])   . Chronic bilateral low back pain without sciatica   . Depression   . GERD (gastroesophageal reflux disease)   . Hyponatremia 01/2016  . Perforated chronic gastric ulcer ([place]) 08/11/2014  . Pneumonia   . Pulmonary arterial hypertension ([place]) 11/28/2014    Past Surgical History:  Procedure Laterality Date  . ABDOMINAL HYSTERECTOMY    . ABDOMINAL SURGERY    . BACK SURGERY  2000  . COLON SURGERY     x2-blockage  . COLONOSCOPY    . ERCP    . HERNIA REPAIR  2012   x2 with mesh  . JOINT REPLACEMENT Left    shoulder  . LAPAROTOMY N/A 08/04/2014   Procedure: EXPLORATORY LAPAROTOMY, REPAIR OF PERFORATED ULCER;  Surgeon: Excell Seltzer, MD;  [facility]: [facility];  Service: General;  Laterality: N/A;  . LAPAROTOMY N/A 12/21/2015   Procedure: EXPLORATORY LAPAROTOMY/ REMOVAL OF MESH;  Surgeon: Coralie Keens, MD;  Location: [place];  Service: General;  Laterality: N/A;  . ORIF WRIST FRACTURE   2012   left  . REVERSE SHOULDER ARTHROPLASTY Right 12/04/2013  . REVERSE SHOULDER ARTHROPLASTY  12/04/2013   Procedure: REVERSE SHOULDER ARTHROPLASTY;  Surgeon: Nita Sells, MD;  Location: [facility] OR;  Service: Orthopedics;;  Right reverse total shoulder arthroplasty  . ROTATOR CUFF REPAIR     dr Tamera Punt  . SHOULDER ARTHROSCOPY  2012   lt and rt  . TENDON REPAIR Right 04/13/2014   Procedure: RIGHT HAND CENTRAL TENDON CENTRALIZATION OF LONG AND RING FINGERS;  Surgeon: Jolyn Nap, MD;  Location: [place];  Service: Orthopedics;  Laterality: Right;    Social History   Socioeconomic History  . Marital status: Married    Spouse name: Not on file  . Number of children: Not on file  . Years of education: Not on file  . Highest education level: Not on file  Occupational History  . Not on file  Social Needs  . Financial resource strain: Not hard at all  . Food insecurity:    Worry: Never true    Inability: Never true  . Transportation needs:    Medical: No    Non-medical: No  Tobacco Use  . Smoking status: Never Smoker  . Smokeless tobacco: Never Used  Substance and Sexual Activity  . Alcohol use: Not Currently    Alcohol/week:  4.2 oz    Types: 7 Glasses of wine per week    Comment: 8 oz -12 oz of wine every   . Drug use: No  . Sexual activity: Not on file  Lifestyle  . Physical activity:    Days per week: 0 days    Minutes per session: 0 min  . Stress: To some extent  Relationships  . Social connections:    Talks on phone: More than three times a week    Gets together: Twice a week    Attends religious service: Never    Active member of club or organization: No    Attends meetings of clubs or organizations: Never    Relationship status: Married  . Intimate partner violence:    Fear of current or ex partner: No    Emotionally abused: No    Physically abused: No    Forced sexual activity: No  Other Topics Concern  . Not on file  Social  History Narrative  . Not on file   Family History  Problem Relation Age of Onset  . Stroke Mother   . Breast cancer Sister   . Ulcerative colitis Daughter   . Chronic fatigue Daughter   . Chronic fatigue Daughter       VITAL SIGNS BP 136/86   Pulse 76   Temp 98.6 F (37 C)   Resp 20   Ht 5\' 3"  (1.6 m)   SpO2 97%   BMI 30.36 kg/m   Outpatient Encounter Medications as of 10/17/2017  Medication Sig Note  . acetaminophen (TYLENOL) 325 MG tablet Take 325 mg by mouth every 6 (six) hours as needed for mild pain.    . bisacodyl (DULCOLAX) 10 MG suppository Place 1 suppository (10 mg total) rectally daily as needed for moderate constipation (May repeat times one).   Marland Kitchen desonide (DESOWEN) 0.05 % cream Apply to face topically as needed for rash/redness two times daily as needed   . diclofenac sodium (VOLTAREN) 1 % GEL Apply 1 g topically 2 (two) times daily. Apply to bilateral knees for pain   . divalproex (DEPAKOTE SPRINKLE) 125 MG capsule Give 4 Capsules (500 mg) by mouth at bedtime   . DULOXETINE HCL PO Take 90 mg by mouth at bedtime.    . fluorometholone (FML) 0.1 % ophthalmic suspension Place 1 drop into both eyes 3 (three) times daily.   . fluticasone (FLONASE) 50 MCG/ACT nasal spray Place 1 spray into both nostrils at bedtime.   . gabapentin (NEURONTIN) 300 MG capsule Take 300 mg by mouth 2 (two) times daily.   Marland Kitchen guaiFENesin (MUCINEX) 600 MG 12 hr tablet Take 600 mg by mouth 2 (two) times daily.    Marland Kitchen ketotifen (ZADITOR) 0.025 % ophthalmic solution Place 1 drop into both eyes 2 (two) times daily as needed.   . loratadine (CLARITIN) 10 MG tablet Take 10 mg by mouth daily.   . magnesium hydroxide (MILK OF MAGNESIA) 400 MG/5ML suspension Take 60 mLs by mouth every other day. AS NEEDED FOR CONSTIPATION 08/04/2014: .   Marland Kitchen Melatonin 3 MG TABS Take 2 tablets by mouth at bedtime.    . methadone (DOLOPHINE) 5 MG tablet Take 1 tablet (5 mg total) by mouth every 12 (twelve) hours.   .  methocarbamol (ROBAXIN) 750 MG tablet Take 750 mg by mouth every 12 (twelve) hours.    . Multiple Vitamin (MULTIVITAMIN) tablet Take 1 tablet by mouth 3 (three) times daily.    . Nutritional Supplements (NUTRITIONAL  SUPPLEMENT PO) Regular Diet - Mechanical soft texture, Regular / thin consistency   . Omega-3 Fatty Acids (FISH OIL) 1000 MG CPDR Take 1,000 mg by mouth daily.    Marland Kitchen oxyCODONE (ROXICODONE) 5 MG immediate release tablet Take 1 tablet (5 mg total) by mouth every 4 (four) hours as needed for severe pain.   . OXYGEN Inhale into the lungs. 2-6 L/min to keep stat at 90 or above   . polyethylene glycol (MIRALAX / GLYCOLAX) packet Take 17 g by mouth 2 (two) times daily.    . pseudoephedrine (SUDAFED) 30 MG tablet Take 30 mg by mouth every 6 (six) hours as needed for congestion.   . ranitidine (ZANTAC) 150 MG tablet Take 150 mg by mouth 2 (two) times daily.   . rivaroxaban (XARELTO) 20 MG TABS tablet Take 20 mg by mouth daily.    . temazepam (RESTORIL) 30 MG capsule Take 1 capsule (30 mg total) by mouth at bedtime as needed for sleep.   Marland Kitchen timolol (BETIMOL) 0.5 % ophthalmic solution Place 1 drop into both eyes 2 (two) times daily.    Marland Kitchen torsemide (DEMADEX) 20 MG tablet Take 2 tablets by mouth once daily   . [DISCONTINUED] ENSURE (ENSURE) Take 120 mLs by mouth 2 (two) times daily.    No facility-administered encounter medications on file as of 10/17/2017.      SIGNIFICANT DIAGNOSTIC EXAMS  PREVIOUS  01-21-16: ct of abdomen and pelvis: 1. Apparent wall thickening at the rectum could reflect mild proctitis. 2. Small bilateral pleural effusions noted. Bibasilar airspace opacities may reflect atelectasis or possibly infection. 3. Large right-sided hiatal hernia, containing most of the stomach. 4. Mild soft tissue inflammation suggested about the bladder. Would correlate for any evidence of cystitis. 5. Nonspecific soft tissue inflammation suggested about the transverse colon. 6. Mild  inflammation about the gallbladder is nonspecific, given the underlying diffuse soft tissue inflammation about the abdomen. 7. Very large anterior abdominal wall defect again noted, with packing material and underlying soft tissue thickening. 8. Scattered diverticulosis along the distal sigmoid colon, without evidence of diverticulitis. 9. Scattered aortic atherosclerosis noted. 10. Right convex thoracolumbar scoliosis noted.   01-21-16 ct of neck: 1. Findings consistent with right parotiditis. No abscess identified. No significant lymphadenopathy. 2. Partially visualized small right pleural effusion and dependent right upper lobe opacity which may represent associated pneumonia or atelectasis.  07-03-16: right hip x-ray:No evidence of fracture or dislocation.   07-03-16: lumbar spine x-ray: No evidence of fracture or subluxation along the lumbar spine. Right convex thoracolumbar scoliosis again noted, with underlying degenerative change.  09-05-17: chest x-ray: no acute cardiopulmonary disease process; large paraesophageal hernia  NO NEW EXAMS    LABS REVIEWED:PREVIOUS    11-28-16:glucose 83; bun 11.0; creat 0.65; k+ 4.2; na++ 133; ca 9.7 05-21-17: wbc 5.6; hgb 11.5; hct 34.3; mcv 98.0 plt 208 glucose 101; bun 9.8; creat 0.41; k+ 4.3; na++ 131; liver normal albumin 2.9 hgb a1c 5.1; chol 147; ldl 74; trig 77; hdl 57; ammonia 104 depakote 41  09-05-17: wbc 6.7; hgb 11.4; hct 33.9; mcv 100.6; plt 234   NO NEW LABS.    Review of Systems  Constitutional: Negative for malaise/fatigue.  Respiratory: Negative for cough and shortness of breath.   Cardiovascular: Negative for chest pain, palpitations and leg swelling.  Gastrointestinal: Negative for abdominal pain, constipation and heartburn.  Musculoskeletal: Positive for back pain. Negative for joint pain and myalgias.  Skin: Negative.   Neurological: Negative for dizziness.  Psychiatric/Behavioral: The  patient is not nervous/anxious.      Physical Exam  Constitutional: She is oriented to person, place, and time. She appears well-developed and well-nourished. No distress.  Neck: No thyromegaly present.  Cardiovascular: Normal rate, regular rhythm, normal heart sounds and intact distal pulses.  Pulmonary/Chest: Effort normal and breath sounds normal. No respiratory distress.  Abdominal: Soft. Bowel sounds are normal. She exhibits no distension. There is no tenderness.  Musculoskeletal: She exhibits edema.  Is able to move upper extremities trace bilateral lower extremity edema   Lymphadenopathy:    She has no cervical adenopathy.  Neurological: She is alert and oriented to person, place, and time.  Skin: Skin is warm and dry. She is not diaphoretic.  Abdominal surgical wound: 2.5 x 9 cm   Psychiatric: She has a normal mood and affect.    ASSESSMENT/ PLAN:  TODAY:   1. Chronic pain syndrome: has chronic bilateral low back pain without sciatica:  I 2. CHF, chronic:EF 55-60% (06-21-14)  3.  Constipation due to opioid therapy:    Will stop fish oil    MD is aware of resident's narcotic use and is in agreement with current plan of care. We will attempt to wean resident as apropriate   Ok Edwards NP [facility]  Contact [phone] Monday through Friday 8am- 5pm  After hours call [phone]

## 2017-10-18 DIAGNOSIS — K219 Gastro-esophageal reflux disease without esophagitis: Secondary | ICD-10-CM | POA: Diagnosis not present

## 2017-10-18 DIAGNOSIS — R1311 Dysphagia, oral phase: Secondary | ICD-10-CM | POA: Diagnosis not present

## 2017-10-18 DIAGNOSIS — I5032 Chronic diastolic (congestive) heart failure: Secondary | ICD-10-CM | POA: Diagnosis not present

## 2017-10-18 DIAGNOSIS — M6281 Muscle weakness (generalized): Secondary | ICD-10-CM | POA: Diagnosis not present

## 2017-10-18 DIAGNOSIS — E43 Unspecified severe protein-calorie malnutrition: Secondary | ICD-10-CM | POA: Diagnosis not present

## 2017-10-23 ENCOUNTER — Encounter: Payer: Self-pay | Admitting: Adult Health

## 2017-10-23 ENCOUNTER — Non-Acute Institutional Stay (SKILLED_NURSING_FACILITY): Payer: PPO | Admitting: Adult Health

## 2017-10-23 DIAGNOSIS — G894 Chronic pain syndrome: Secondary | ICD-10-CM | POA: Diagnosis not present

## 2017-10-23 DIAGNOSIS — I509 Heart failure, unspecified: Secondary | ICD-10-CM

## 2017-10-23 DIAGNOSIS — I82513 Chronic embolism and thrombosis of femoral vein, bilateral: Secondary | ICD-10-CM

## 2017-10-23 DIAGNOSIS — G4700 Insomnia, unspecified: Secondary | ICD-10-CM

## 2017-10-23 NOTE — Progress Notes (Signed)
Location:   [facility] Room Number: 105 A Place of Service:  SNF (31)   CODE STATUS: DNR (Most form updated 12-20-16)  Allergies  Allergen Reactions  . Tape Other (See Comments)    SKIN IS VERY THIN; IT BRUISES AND TEARS EASILY!! -THX    Chief Complaint  Patient presents with  . Medical Management of Chronic Issues    Chf; dvt; chronic pain; insomnia.     HPI:  She is a 84 year old long term resident of this facility being seen for the management of her chronic illnesses; chf; dvt; chronic pain and insomnia. She denies uncontrolled pain; no chest pain or shortness of breath. There are no nursing concerns at this time.   Past Medical History:  Diagnosis Date  . Anemia   . Arthritis   . CHF (congestive heart failure) ([place])   . Chronic bilateral low back pain without sciatica   . Depression   . GERD (gastroesophageal reflux disease)   . Hyponatremia 01/2016  . Perforated chronic gastric ulcer ([place]) 08/11/2014  . Pneumonia   . Pulmonary arterial hypertension ([place]) 11/28/2014    Past Surgical History:  Procedure Laterality Date  . ABDOMINAL HYSTERECTOMY    . ABDOMINAL SURGERY    . BACK SURGERY  2000  . COLON SURGERY     x2-blockage  . COLONOSCOPY    . ERCP    . HERNIA REPAIR  2012   x2 with mesh  . JOINT REPLACEMENT Left    shoulder  . LAPAROTOMY N/A 08/04/2014   Procedure: EXPLORATORY LAPAROTOMY, REPAIR OF PERFORATED ULCER;  Surgeon: Excell Seltzer, MD;  [facility]: [facility];  Service: General;  Laterality: N/A;  . LAPAROTOMY N/A 12/21/2015   Procedure: EXPLORATORY LAPAROTOMY/ REMOVAL OF MESH;  Surgeon: Coralie Keens, MD;  Location: [place];  Service: General;  Laterality: N/A;  . ORIF WRIST FRACTURE  2012   left  . REVERSE SHOULDER ARTHROPLASTY Right 12/04/2013  . REVERSE SHOULDER ARTHROPLASTY  12/04/2013   Procedure: REVERSE SHOULDER ARTHROPLASTY;  Surgeon: Nita Sells, MD;  Location: [facility] OR;  Service: Orthopedics;;  Right reverse total  shoulder arthroplasty  . ROTATOR CUFF REPAIR     dr Tamera Punt  . SHOULDER ARTHROSCOPY  2012   lt and rt  . TENDON REPAIR Right 04/13/2014   Procedure: RIGHT HAND CENTRAL TENDON CENTRALIZATION OF LONG AND RING FINGERS;  Surgeon: Jolyn Nap, MD;  Location: [place];  Service: Orthopedics;  Laterality: Right;    Social History   Socioeconomic History  . Marital status: Married    Spouse name: Not on file  . Number of children: Not on file  . Years of education: Not on file  . Highest education level: Not on file  Occupational History  . Not on file  Social Needs  . Financial resource strain: Not hard at all  . Food insecurity:    Worry: Never true    Inability: Never true  . Transportation needs:    Medical: No    Non-medical: No  Tobacco Use  . Smoking status: Never Smoker  . Smokeless tobacco: Never Used  Substance and Sexual Activity  . Alcohol use: Not Currently    Alcohol/week: 4.2 oz    Types: 7 Glasses of wine per week    Comment: 8 oz -12 oz of wine every   . Drug use: No  . Sexual activity: Not on file  Lifestyle  . Physical activity:    Days per  week: 0 days    Minutes per session: 0 min  . Stress: To some extent  Relationships  . Social connections:    Talks on phone: More than three times a week    Gets together: Twice a week    Attends religious service: Never    Active member of club or organization: No    Attends meetings of clubs or organizations: Never    Relationship status: Married  . Intimate partner violence:    Fear of current or ex partner: No    Emotionally abused: No    Physically abused: No    Forced sexual activity: No  Other Topics Concern  . Not on file  Social History Narrative  . Not on file   Family History  Problem Relation Age of Onset  . Stroke Mother   . Breast cancer Sister   . Ulcerative colitis Daughter   . Chronic fatigue Daughter   . Chronic fatigue Daughter       VITAL SIGNS BP 120/78    Pulse 75   Temp (!) 97.1 F (36.2 C)   Resp 18   Ht 5\' 3"  (1.6 m)   SpO2 98%   BMI 30.36 kg/m   Outpatient Encounter Medications as of 10/23/2017  Medication Sig Note  . acetaminophen (TYLENOL) 325 MG tablet Take 325 mg by mouth every 6 (six) hours as needed for mild pain.    . bisacodyl (DULCOLAX) 10 MG suppository Place 1 suppository (10 mg total) rectally daily as needed for moderate constipation (May repeat times one).   Marland Kitchen desonide (DESOWEN) 0.05 % cream Apply to face topically as needed for rash/redness two times daily as needed   . diclofenac sodium (VOLTAREN) 1 % GEL Apply 1 g topically 2 (two) times daily. Apply to bilateral knees for pain   . divalproex (DEPAKOTE SPRINKLE) 125 MG capsule Give 4 Capsules (500 mg) by mouth at bedtime   . DULOXETINE HCL PO Take 90 mg by mouth at bedtime.    . fluorometholone (FML) 0.1 % ophthalmic suspension Place 1 drop into both eyes 3 (three) times daily.   . fluticasone (FLONASE) 50 MCG/ACT nasal spray Place 1 spray into both nostrils at bedtime.   . gabapentin (NEURONTIN) 300 MG capsule Take 300 mg by mouth 2 (two) times daily.   Marland Kitchen ketotifen (ZADITOR) 0.025 % ophthalmic solution Place 1 drop into both eyes 2 (two) times daily as needed.   . loratadine (CLARITIN) 10 MG tablet Take 10 mg by mouth daily.   . magnesium hydroxide (MILK OF MAGNESIA) 400 MG/5ML suspension Take 60 mLs by mouth every other day. AS NEEDED FOR CONSTIPATION 08/04/2014: .   Marland Kitchen Melatonin 3 MG TABS Take 2 tablets by mouth at bedtime.    . methadone (DOLOPHINE) 5 MG tablet Take 1 tablet (5 mg total) by mouth every 12 (twelve) hours.   . methocarbamol (ROBAXIN) 750 MG tablet Take 750 mg by mouth every 12 (twelve) hours.    . Multiple Vitamin (MULTIVITAMIN) tablet Take 1 tablet by mouth 3 (three) times daily.    . Nutritional Supplements (NUTRITIONAL SUPPLEMENT PO) Regular Diet - Mechanical soft texture, Regular / thin consistency   . nystatin (MYCOSTATIN/NYSTOP) powder Apply  under both breast topically two times daily x 7 days.  Ending on 10/28/17   . oxyCODONE (ROXICODONE) 5 MG immediate release tablet Take 1 tablet (5 mg total) by mouth every 4 (four) hours as needed for severe pain.   . OXYGEN Inhale into  the lungs. 2-6 L/min to keep stat at 90 or above   . polyethylene glycol (MIRALAX / GLYCOLAX) packet Take 17 g by mouth 2 (two) times daily.    . pseudoephedrine (SUDAFED) 30 MG tablet Take 30 mg by mouth every 6 (six) hours as needed for congestion.   . ranitidine (ZANTAC) 150 MG tablet Take 150 mg by mouth 2 (two) times daily.   . rivaroxaban (XARELTO) 20 MG TABS tablet Take 20 mg by mouth daily.    . temazepam (RESTORIL) 30 MG capsule Take 1 capsule (30 mg total) by mouth at bedtime as needed for sleep.   Marland Kitchen timolol (BETIMOL) 0.5 % ophthalmic solution Place 1 drop into both eyes 2 (two) times daily.    Marland Kitchen torsemide (DEMADEX) 20 MG tablet Take 2 tablets by mouth once daily   . [DISCONTINUED] guaiFENesin (MUCINEX) 600 MG 12 hr tablet Take 600 mg by mouth 2 (two) times daily.    . [DISCONTINUED] Omega-3 Fatty Acids (FISH OIL) 1000 MG CPDR Take 1,000 mg by mouth daily.     No facility-administered encounter medications on file as of 10/23/2017.      SIGNIFICANT DIAGNOSTIC EXAMS   PREVIOUS  01-21-16: ct of abdomen and pelvis: 1. Apparent wall thickening at the rectum could reflect mild proctitis. 2. Small bilateral pleural effusions noted. Bibasilar airspace opacities may reflect atelectasis or possibly infection. 3. Large right-sided hiatal hernia, containing most of the stomach. 4. Mild soft tissue inflammation suggested about the bladder. Would correlate for any evidence of cystitis. 5. Nonspecific soft tissue inflammation suggested about the transverse colon. 6. Mild inflammation about the gallbladder is nonspecific, given the underlying diffuse soft tissue inflammation about the abdomen. 7. Very large anterior abdominal wall defect again noted, with packing  material and underlying soft tissue thickening. 8. Scattered diverticulosis along the distal sigmoid colon, without evidence of diverticulitis. 9. Scattered aortic atherosclerosis noted. 10. Right convex thoracolumbar scoliosis noted.   01-21-16 ct of neck: 1. Findings consistent with right parotiditis. No abscess identified. No significant lymphadenopathy. 2. Partially visualized small right pleural effusion and dependent right upper lobe opacity which may represent associated pneumonia or atelectasis.  07-03-16: right hip x-ray:No evidence of fracture or dislocation.   07-03-16: lumbar spine x-ray: No evidence of fracture or subluxation along the lumbar spine. Right convex thoracolumbar scoliosis again noted, with underlying degenerative change.  09-05-17: chest x-ray: no acute cardiopulmonary disease process; large paraesophageal hernia  NO NEW EXAMS    LABS REVIEWED:PREVIOUS    11-28-16:glucose 83; bun 11.0; creat 0.65; k+ 4.2; na++ 133; ca 9.7 05-21-17: wbc 5.6; hgb 11.5; hct 34.3; mcv 98.0 plt 208 glucose 101; bun 9.8; creat 0.41; k+ 4.3; na++ 131; liver normal albumin 2.9 hgb a1c 5.1; chol 147; ldl 74; trig 77; hdl 57; ammonia 104 depakote 41  09-05-17: wbc 6.7; hgb 11.4; hct 33.9; mcv 100.6; plt 234   NO NEW LABS.   Review of Systems  Constitutional: Negative for malaise/fatigue.  Respiratory: Negative for cough and shortness of breath.   Cardiovascular: Negative for chest pain, palpitations and leg swelling.  Gastrointestinal: Negative for abdominal pain, constipation and heartburn.  Musculoskeletal: Negative for back pain, joint pain and myalgias.  Skin: Negative.   Neurological: Negative for dizziness.  Psychiatric/Behavioral: The patient is not nervous/anxious.     Physical Exam  Constitutional: She is oriented to person, place, and time. She appears well-developed and well-nourished. No distress.  Neck: No thyromegaly present.  Cardiovascular: Normal rate, regular rhythm,  normal  heart sounds and intact distal pulses.  Pulmonary/Chest: Effort normal and breath sounds normal. No respiratory distress.  Abdominal: Soft. Bowel sounds are normal. She exhibits no distension. There is no tenderness.  Musculoskeletal: She exhibits no edema.  Is able to move upper extremities  Lymphadenopathy:    She has no cervical adenopathy.  Neurological: She is alert and oriented to person, place, and time.  Skin: Skin is warm and dry. She is not diaphoretic.  Dressing to abdomen dry and intact   Psychiatric: She has a normal mood and affect.     ASSESSMENT/ PLAN:  TODAY:   1. Chronic pain syndrome: has chronic bilateral low back pain without sciatica:  Is not being adequately managed  is on chronic narcotic therapy: will continue  neurontin 300 mg twice daily   voltaren gel 1 gm twice daily to knees oxycodone 5 every 4 hours as needed  Will change methadone to 5 mg twice daily and robaxin 750 mg twice daily   2. Insomnia: is stable will continue  restoril  30 mg nightly she has not tolerated lower doses    3. Deep vein thrombosis of femoral vein of both lower extremities :is stable  will continue xarelto 20 mg daily    4. CHF, chronic:EF 55-60% (06-21-14) is stable will continue demadex 40 mg daily uses 02 as needed  PREVIOUS   5. Gerd: stable  will continue zantac 150 mg twice daily   6. Depression with anxiety:is stable  will continue cymbalta 90 mg daily takes depakote 500 mg nightly to stabilize mood. Will continue to monitor her status.   7. Glaucoma: is without change  will continue timolol to both eyes twice daily fluorometholone 1 drop both eyes three times daily zaditor 1 drop both eyes twice daily as needed for allergies   8. Seasonal allergic rhinitis: is stable  will continue flonase daily claritin 10 mg daily  and mucinex twice  daily   9. Pulmonary arterial hypertension: stable  b/p 135/70 is currently not on medications;  no changes will monitor   10.   Constipation due to opioid therapy: stable  will continue MOM 30 cc every other day as needed;  miralax  17 gm twice daily as needed will monitor     MD is aware of resident's narcotic use and is in agreement with current plan of care. We will attempt to wean resident as apropriate   Ok Edwards NP [facility]  Contact [phone] Monday through Friday 8am- 5pm  After hours call [phone]

## 2017-10-25 ENCOUNTER — Ambulatory Visit (HOSPITAL_COMMUNITY): Payer: PPO

## 2017-10-25 ENCOUNTER — Inpatient Hospital Stay (HOSPITAL_COMMUNITY): Admission: RE | Admit: 2017-10-25 | Payer: Self-pay | Source: Ambulatory Visit

## 2017-10-30 ENCOUNTER — Encounter: Payer: Self-pay | Admitting: Adult Health

## 2017-10-30 ENCOUNTER — Other Ambulatory Visit: Payer: Self-pay

## 2017-10-30 MED ORDER — OXYCODONE HCL 5 MG PO TABS: 5.0000 mg | ORAL_TABLET | ORAL | 0 refills | Status: DC | PRN

## 2017-10-30 NOTE — Telephone Encounter (Signed)
Rx faxed to [facility] (P) [phone], (F) [phone] 

## 2017-11-01 DIAGNOSIS — F331 Major depressive disorder, recurrent, moderate: Secondary | ICD-10-CM | POA: Diagnosis not present

## 2017-11-01 DIAGNOSIS — F5101 Primary insomnia: Secondary | ICD-10-CM | POA: Diagnosis not present

## 2017-11-07 ENCOUNTER — Encounter: Payer: Self-pay | Admitting: Internal Medicine

## 2017-11-12 ENCOUNTER — Encounter: Payer: Self-pay | Admitting: Internal Medicine

## 2017-11-12 ENCOUNTER — Non-Acute Institutional Stay (SKILLED_NURSING_FACILITY): Payer: PPO | Admitting: Internal Medicine

## 2017-11-12 DIAGNOSIS — R600 Localized edema: Secondary | ICD-10-CM

## 2017-11-12 DIAGNOSIS — E43 Unspecified severe protein-calorie malnutrition: Secondary | ICD-10-CM

## 2017-11-12 DIAGNOSIS — I82513 Chronic embolism and thrombosis of femoral vein, bilateral: Secondary | ICD-10-CM

## 2017-11-12 DIAGNOSIS — I509 Heart failure, unspecified: Secondary | ICD-10-CM

## 2017-11-12 NOTE — Progress Notes (Signed)
Patient ID: Aarianna Hoadley, female   DOB: [birth year], 84 y.o.   MRN: [id number]   Location:  [facility] Room Number: 621 A Place of Service:  SNF (31) Provider:  [place], [place], DO  Patient Care Team: Gildardo Cranker, DO as PCP - General (Internal Medicine) Nyoka Cowden Phylis Bougie, NP as Nurse Practitioner ([place]) [facility], [place] ([place])  Extended Emergency Contact Information Primary Emergency Contact: Millon,Bill Address: [address]          [place], [place] [address] Johnnette Litter of [place] Phone: [phone] Mobile Phone: [phone] Relation: Spouse Secondary Emergency Contact: Faythe Ghee Address: [address]          [place], [place] [address] Johnnette Litter of [place] Phone: [phone] Mobile Phone: [phone] Relation: Sister  Code Status:  DNR Goals of care: Advanced Directive information Advanced Directives 11/12/2017  Does Patient Have a Medical Advance Directive? Yes  Type of Advance Directive Out of facility DNR (pink MOST or yellow form)  Does patient want to make changes to medical advance directive? No - Patient declined  Copy of [place] in Chart? -  Would patient like information on creating a medical advance directive? No - Patient declined  Pre-existing out of facility DNR order (yellow form or pink MOST form) Yellow form placed in chart (order not valid for inpatient use);Pink MOST form placed in chart (order not valid for inpatient use)     Chief Complaint  Patient presents with  . Acute Visit    Bilateral Lower Extremity Edema    HPI:  Pt is a 84 y.o. female seen today for an acute visit for BLE edema x 3-4 days. Pt denies CP, SOB or palpitations. (+) wt gain per SNF records. She states LLE weaker than RLE. She has a chronic abdominal surgical wound s/p laparotomy that is followed by facility wound care provider. She has protein calorie  malnutrition and hx CHF. She gets nutritional supplements per facility protocol and takes 40mg  demadex daily. Albumin 2.9 in March 2019  She is unhappy with meds being crushed as they taste bitter. Nursing discovered that pt had been hoarding her pills. She also refused swallowing study. Meds being crushed for better compliance but pt does refuse them anyway.    Past Medical History:  Diagnosis Date  . Anemia   . Arthritis   . CHF (congestive heart failure) ([place])   . Chronic bilateral low back pain without sciatica   . Depression   . GERD (gastroesophageal reflux disease)   . Hyponatremia 01/2016  . Insomnia 05/20/2016  . Perforated chronic gastric ulcer ([place]) 08/11/2014  . Pneumonia   . Pulmonary arterial hypertension ([place]) 11/28/2014   Past Surgical History:  Procedure Laterality Date  . ABDOMINAL HYSTERECTOMY    . ABDOMINAL SURGERY    . BACK SURGERY  2000  . COLON SURGERY     x2-blockage  . COLONOSCOPY    . ERCP    . HERNIA REPAIR  2012   x2 with mesh  . JOINT REPLACEMENT Left    shoulder  . LAPAROTOMY N/A 08/04/2014   Procedure: EXPLORATORY LAPAROTOMY, REPAIR OF PERFORATED ULCER;  Surgeon: Excell Seltzer, MD;  [facility]: [facility];  Service: General;  Laterality: N/A;  . LAPAROTOMY N/A 12/21/2015   Procedure: EXPLORATORY LAPAROTOMY/ REMOVAL OF MESH;  Surgeon: Coralie Keens, MD;  Location: [facility];  Service: General;  Laterality: N/A;  . ORIF WRIST FRACTURE  2012  left  . REVERSE SHOULDER ARTHROPLASTY Right 12/04/2013  . REVERSE SHOULDER ARTHROPLASTY  12/04/2013   Procedure: REVERSE SHOULDER ARTHROPLASTY;  Surgeon: Nita Sells, MD;  Location[facility];  Service: Orthopedics;;  Right reverse total shoulder arthroplasty  . ROTATOR CUFF REPAIR     dr Tamera Punt  . SHOULDER ARTHROSCOPY  2012   lt and rt  . TENDON REPAIR Right 04/13/2014   Procedure: RIGHT HAND CENTRAL TENDON CENTRALIZATION OF LONG AND RING FINGERS;  Surgeon: Jolyn Nap, MD;  Location: [facility];  Service: Orthopedics;  Laterality: Right;    Allergies  Allergen Reactions  . Tape Other (See Comments)    SKIN IS VERY THIN; IT BRUISES AND TEARS EASILY!! -THX    Outpatient Encounter Medications as of 11/12/2017  Medication Sig  . acetaminophen (TYLENOL) 325 MG tablet Take 325 mg by mouth every 6 (six) hours as needed for mild pain.   . bisacodyl (DULCOLAX) 10 MG suppository Place 1 suppository (10 mg total) rectally daily as needed for moderate constipation (May repeat times one).  Marland Kitchen diclofenac sodium (VOLTAREN) 1 % GEL Apply 1 g topically 2 (two) times daily. Apply to bilateral knees for pain  . divalproex (DEPAKOTE SPRINKLE) 125 MG capsule Give 4 Capsules (500 mg) by mouth at bedtime  . DULOXETINE HCL PO Take 90 mg by mouth at bedtime.   . fluorometholone (FML) 0.1 % ophthalmic suspension Place 1 drop into both eyes 3 (three) times daily.  . fluticasone (FLONASE) 50 MCG/ACT nasal spray Place 1 spray into both nostrils at bedtime.  . gabapentin (NEURONTIN) 300 MG capsule Take 300 mg by mouth 2 (two) times daily.  Marland Kitchen ketotifen (ZADITOR) 0.025 % ophthalmic solution Place 1 drop into both eyes 2 (two) times daily as needed.  . loratadine (CLARITIN) 10 MG tablet Take 10 mg by mouth daily.  . magnesium hydroxide (MILK OF MAGNESIA) 400 MG/5ML suspension Take 60 mLs by mouth every other day. AS NEEDED FOR CONSTIPATION  . Melatonin 3 MG TABS Take 2 tablets by mouth at bedtime.   . methadone (DOLOPHINE) 5 MG tablet Take 1 tablet (5 mg total) by mouth every 12 (twelve) hours.  . methocarbamol (ROBAXIN) 750 MG tablet Take 750 mg by mouth every 12 (twelve) hours.   . Multiple Vitamin (MULTIVITAMIN) tablet Take 1 tablet by mouth 3 (three) times daily.   . Nutritional Supplements (NUTRITIONAL SUPPLEMENT PO) Regular Diet - Mechanical soft texture  . oxyCODONE (ROXICODONE) 5 MG immediate release tablet Take 1 tablet (5 mg total) by mouth every 4 (four) hours as needed for severe pain.    . OXYGEN Inhale into the lungs. 2-6 L/min to keep stat at 90 or above  . polyethylene glycol (MIRALAX / GLYCOLAX) packet Take 17 g by mouth 2 (two) times daily.   . pseudoephedrine (SUDAFED) 30 MG tablet Take 30 mg by mouth every 6 (six) hours as needed for congestion.  . ranitidine (ZANTAC) 150 MG tablet Take 150 mg by mouth 2 (two) times daily.  . rivaroxaban (XARELTO) 20 MG TABS tablet Take 20 mg by mouth daily.   . temazepam (RESTORIL) 30 MG capsule Take 1 capsule (30 mg total) by mouth at bedtime as needed for sleep.  Marland Kitchen timolol (BETIMOL) 0.5 % ophthalmic solution Place 1 drop into both eyes 2 (two) times daily.   Marland Kitchen torsemide (DEMADEX) 20 MG tablet Take 2 tablets by mouth once daily  . [DISCONTINUED] desonide (DESOWEN) 0.05 % cream Apply to face topically as  needed for rash/redness two times daily as needed   No facility-administered encounter medications on file as of 11/12/2017.     Review of Systems  Cardiovascular: Positive for leg swelling.  Musculoskeletal: Positive for back pain and gait problem.  Skin: Positive for wound.  Neurological: Positive for weakness.  All other systems reviewed and are negative.   Immunization History  Administered Date(s) Administered  . Influenza Split 02/03/2015  . Influenza-Unspecified 01/18/2017  . PPD Test 10/09/2017  . Pneumococcal Polysaccharide-23 10/13/2017   Pertinent  Health Maintenance Due  Topic Date Due  . INFLUENZA VACCINE  01/23/2018 (Originally 10/18/2017)  . DEXA SCAN  Discontinued  . PNA vac Low Risk Adult  Discontinued   Fall Risk  09/13/2017 09/11/2016  Falls in the past year? No No   Functional Status Survey:    Vitals:   11/12/17 1141  BP: 120/82  Pulse: 80  Resp: 20  Temp: 98.1 F (36.7 C)  SpO2: 98%  Height: 5\' 3"  (1.6 m)   Body mass index is 30.36 kg/m. Physical Exam  Constitutional: She is oriented to person, place, and time. She appears well-developed and well-nourished.  Sitting up in bed in NAD   HENT:  Mouth/Throat: Oropharynx is clear and moist. No oropharyngeal exudate.  MMM; no oral thrush  Eyes: Pupils are equal, round, and reactive to light. No scleral icterus.  Neck: Neck supple. Carotid bruit is not present. No tracheal deviation present. No thyromegaly present.  Cardiovascular: Normal rate, regular rhythm and intact distal pulses. Exam reveals no gallop and no friction rub.  Murmur (1/6 SEM) heard. +1 pitting BLE edema>>>abdomen  Pulmonary/Chest: Effort normal. No stridor. No respiratory distress. She has decreased breath sounds (b/l at base). She has no wheezes. She has no rales.  Abdominal: Soft. Normal appearance and bowel sounds are normal. She exhibits distension. She exhibits no mass. There is no hepatomegaly. There is no tenderness. There is no rigidity, no rebound and no guarding. No hernia.  Obese; abdominal wound dsg c/d/i  Musculoskeletal: She exhibits edema.  Lymphadenopathy:    She has no cervical adenopathy.  Neurological: She is alert and oriented to person, place, and time. She has normal reflexes.  Skin: Skin is warm and dry. Rash (eczematous on b/l LE) noted.  Psychiatric: She has a normal mood and affect. Her behavior is normal. Judgment and thought content normal.    Labs reviewed: Recent Labs    03/09/17 0022 05/21/17 10/05/17  NA 131* 131* 138  K 4.4 4.3 3.5  CL 92*  --   --   GLUCOSE 98  --   --   BUN 22* 10 11  CREATININE 0.70 0.4* 0.5   Recent Labs    05/21/17  AST 9*  ALT 5*  ALKPHOS 66   Recent Labs    03/09/17 0022 05/21/17 09/05/17  WBC  --  5.6 6.7  NEUTROABS  --  3 4  HGB 11.6* 11.5* 11.4*  HCT 34.0* 34* 34*  PLT  --  208 234   Lab Results  Component Value Date   TSH 1.605 07/18/2014   Lab Results  Component Value Date   HGBA1C 5.1 05/21/2017   Lab Results  Component Value Date   CHOL 147 05/21/2017   HDL 57 05/21/2017   LDLCALC 74 05/21/2017   TRIG 77 05/21/2017    Significant Diagnostic Results in last 30  days:  No results found.  Assessment/Plan   ICD-10-CM   1. Bilateral lower extremity edema R60.0  2. Chronic congestive heart failure, unspecified heart failure type ([place]) I50.9   3. Protein-calorie malnutrition, severe ([place]) E43   4. Chronic deep vein thrombosis (DVT) of femoral vein of both lower extremities ([facility]) I82.513     Give extra dose of demadex today  Cont other meds as ordered - cont crushing meds due to c/a compliance  May need cardiology referral pending echo results  Daily weight and record  [facility] NP to follow  Will follow  Labs/tests ordered: cbc w diff, cmp, TSH; 2D echo   Belcher S. Perlie Gold  [facility] [address] [place], [place] [address] [phone] Cell (Monday-Friday 8 AM - 5 PM) [phone] After 5 PM and follow prompts

## 2017-11-13 DIAGNOSIS — E039 Hypothyroidism, unspecified: Secondary | ICD-10-CM | POA: Diagnosis not present

## 2017-11-13 DIAGNOSIS — Z79899 Other long term (current) drug therapy: Secondary | ICD-10-CM | POA: Diagnosis not present

## 2017-11-13 DIAGNOSIS — D649 Anemia, unspecified: Secondary | ICD-10-CM | POA: Diagnosis not present

## 2017-11-13 LAB — BASIC METABOLIC PANEL
BUN: 11 (ref 4–21)
Creatinine: 0.7 (ref 0.5–1.1)
GLUCOSE: 96
Potassium: 4.1 (ref 3.4–5.3)
SODIUM: 139 (ref 137–147)

## 2017-11-13 LAB — HEPATIC FUNCTION PANEL
ALT: 7 (ref 7–35)
AST: 11 — AB (ref 13–35)
Alkaline Phosphatase: 77 (ref 25–125)
BILIRUBIN, TOTAL: 0.2

## 2017-11-13 LAB — CBC AND DIFFERENTIAL
HEMATOCRIT: 35 — AB (ref 36–46)
Hemoglobin: 11.7 — AB (ref 12.0–16.0)
NEUTROS ABS: 3
PLATELETS: 266 (ref 150–399)
WBC: 5.9

## 2017-11-15 ENCOUNTER — Other Ambulatory Visit: Payer: Self-pay

## 2017-11-15 MED ORDER — TEMAZEPAM 30 MG PO CAPS: 30.0000 mg | ORAL_CAPSULE | Freq: Every evening | ORAL | 0 refills | Status: DC | PRN

## 2017-11-15 MED ORDER — METHADONE HCL 5 MG PO TABS: 5.0000 mg | ORAL_TABLET | Freq: Two times a day (BID) | ORAL | 0 refills | Status: DC

## 2017-11-15 NOTE — Telephone Encounter (Signed)
Rx faxed to [facility] (P) [phone], (F) [phone] 

## 2017-11-22 ENCOUNTER — Non-Acute Institutional Stay (SKILLED_NURSING_FACILITY): Payer: PPO | Admitting: Adult Health

## 2017-11-22 ENCOUNTER — Encounter: Payer: Self-pay | Admitting: Adult Health

## 2017-11-22 DIAGNOSIS — J302 Other seasonal allergic rhinitis: Secondary | ICD-10-CM | POA: Diagnosis not present

## 2017-11-22 DIAGNOSIS — F418 Other specified anxiety disorders: Secondary | ICD-10-CM | POA: Diagnosis not present

## 2017-11-22 DIAGNOSIS — H409 Unspecified glaucoma: Secondary | ICD-10-CM

## 2017-11-22 DIAGNOSIS — K219 Gastro-esophageal reflux disease without esophagitis: Secondary | ICD-10-CM

## 2017-11-22 NOTE — Progress Notes (Signed)
Location:   [facility] Room Number: 201 A Place of Service:  SNF (31)   CODE STATUS: DNR (Most form updated 12-20-16)  Allergies  Allergen Reactions  . Tape Other (See Comments)    SKIN IS VERY THIN; IT BRUISES AND TEARS EASILY!! -THX    Chief Complaint  Patient presents with  . Medical Management of Chronic Issues    Gerd; allergic rhinitis; depression; glaucoma    HPI:  She  is a 84 year old long term resident of this facility being seen for the management of her chronic illnesses: gerd; allergic rhinitis; depression glaucoma. She denies any uncontrolled pain; no changes in appetite no anxiety no depressive feelings. There are no nursing concerns at this time.   Past Medical History:  Diagnosis Date  . Anemia   . Arthritis   . CHF (congestive heart failure) ([place])   . Chronic bilateral low back pain without sciatica   . Depression   . GERD (gastroesophageal reflux disease)   . Hyponatremia 01/2016  . Insomnia 05/20/2016  . Perforated chronic gastric ulcer ([place]) 08/11/2014  . Pneumonia   . Pulmonary arterial hypertension ([place]) 11/28/2014    Past Surgical History:  Procedure Laterality Date  . ABDOMINAL HYSTERECTOMY    . ABDOMINAL SURGERY    . BACK SURGERY  2000  . COLON SURGERY     x2-blockage  . COLONOSCOPY    . ERCP    . HERNIA REPAIR  2012   x2 with mesh  . JOINT REPLACEMENT Left    shoulder  . LAPAROTOMY N/A 08/04/2014   Procedure: EXPLORATORY LAPAROTOMY, REPAIR OF PERFORATED ULCER;  Surgeon: Excell Seltzer, MD;  [facility]: [facility];  Service: General;  Laterality: N/A;  . LAPAROTOMY N/A 12/21/2015   Procedure: EXPLORATORY LAPAROTOMY/ REMOVAL OF MESH;  Surgeon: Coralie Keens, MD;  Location: [facility];  Service: General;  Laterality: N/A;  . ORIF WRIST FRACTURE  2012   left  . REVERSE SHOULDER ARTHROPLASTY Right 12/04/2013  . REVERSE SHOULDER ARTHROPLASTY  12/04/2013   Procedure: REVERSE SHOULDER ARTHROPLASTY;  Surgeon: Nita Sells,  MD;  Location: [facility] OR;  Service: Orthopedics;;  Right reverse total shoulder arthroplasty  . ROTATOR CUFF REPAIR     dr Tamera Punt  . SHOULDER ARTHROSCOPY  2012   lt and rt  . TENDON REPAIR Right 04/13/2014   Procedure: RIGHT HAND CENTRAL TENDON CENTRALIZATION OF LONG AND RING FINGERS;  Surgeon: Jolyn Nap, MD;  Location: [facility];  Service: Orthopedics;  Laterality: Right;    Social History   Socioeconomic History  . Marital status: Married    Spouse name: Not on file  . Number of children: Not on file  . Years of education: Not on file  . Highest education level: Not on file  Occupational History  . Not on file  Social Needs  . Financial resource strain: Not hard at all  . Food insecurity:    Worry: Never true    Inability: Never true  . Transportation needs:    Medical: No    Non-medical: No  Tobacco Use  . Smoking status: Never Smoker  . Smokeless tobacco: Never Used  Substance and Sexual Activity  . Alcohol use: Not Currently    Alcohol/week: 7.0 standard drinks    Types: 7 Glasses of wine per week    Comment: 8 oz -12 oz of wine every   . Drug use: No  . Sexual activity: Not on file  Lifestyle  .  Physical activity:    Days per week: 0 days    Minutes per session: 0 min  . Stress: To some extent  Relationships  . Social connections:    Talks on phone: More than three times a week    Gets together: Twice a week    Attends religious service: Never    Active member of club or organization: No    Attends meetings of clubs or organizations: Never    Relationship status: Married  . Intimate partner violence:    Fear of current or ex partner: No    Emotionally abused: No    Physically abused: No    Forced sexual activity: No  Other Topics Concern  . Not on file  Social History Narrative  . Not on file   Family History  Problem Relation Age of Onset  . Stroke Mother   . Breast cancer Sister   . Ulcerative colitis Daughter   . Chronic  fatigue Daughter   . Chronic fatigue Daughter       VITAL SIGNS BP 120/82   Pulse 80   Temp 98.1 F (36.7 C)   Resp 20   Ht 5\' 3"  (1.6 m)   Wt 226 lb 6.4 oz (102.7 kg)   SpO2 97%   BMI 40.10 kg/m   Outpatient Encounter Medications as of 11/22/2017  Medication Sig Note  . acetaminophen (TYLENOL) 325 MG tablet Take 325 mg by mouth every 6 (six) hours as needed for mild pain.    . bisacodyl (DULCOLAX) 10 MG suppository Place 1 suppository (10 mg total) rectally daily as needed for moderate constipation (May repeat times one).   Marland Kitchen diclofenac sodium (VOLTAREN) 1 % GEL Apply 1 g topically 2 (two) times daily. Apply to bilateral knees for pain   . divalproex (DEPAKOTE SPRINKLE) 125 MG capsule Give 4 Capsules (500 mg) by mouth at bedtime   . DULOXETINE HCL PO Take 90 mg by mouth at bedtime.    . fluorometholone (FML) 0.1 % ophthalmic suspension Place 1 drop into both eyes 3 (three) times daily.   . fluticasone (FLONASE) 50 MCG/ACT nasal spray Place 1 spray into both nostrils at bedtime.   . gabapentin (NEURONTIN) 300 MG capsule Take 300 mg by mouth 2 (two) times daily.   Marland Kitchen ketotifen (ZADITOR) 0.025 % ophthalmic solution Place 1 drop into both eyes 2 (two) times daily as needed.   . loratadine (CLARITIN) 10 MG tablet Take 10 mg by mouth daily.   . magnesium hydroxide (MILK OF MAGNESIA) 400 MG/5ML suspension Take 60 mLs by mouth every other day. AS NEEDED FOR CONSTIPATION 08/04/2014: .   Marland Kitchen Melatonin 3 MG TABS Take 2 tablets by mouth at bedtime.    . methadone (DOLOPHINE) 5 MG tablet Take 1 tablet (5 mg total) by mouth every 12 (twelve) hours.   . methocarbamol (ROBAXIN) 750 MG tablet Take 750 mg by mouth every 12 (twelve) hours.    . Multiple Vitamin (MULTIVITAMIN) tablet Take 1 tablet by mouth 3 (three) times daily.    . Nutritional Supplements (NUTRITIONAL SUPPLEMENT PO) Regular Diet - Mechanical soft texture   . oxyCODONE (ROXICODONE) 5 MG immediate release tablet Take 1 tablet (5 mg total)  by mouth every 4 (four) hours as needed for severe pain.   . OXYGEN Inhale into the lungs. 2-6 L/min to keep stat at 90 or above   . polyethylene glycol (MIRALAX / GLYCOLAX) packet Take 17 g by mouth 2 (two) times daily.    Marland Kitchen  pseudoephedrine (SUDAFED) 30 MG tablet Take 30 mg by mouth every 6 (six) hours as needed for congestion.   . ranitidine (ZANTAC) 150 MG tablet Take 150 mg by mouth 2 (two) times daily.   . rivaroxaban (XARELTO) 20 MG TABS tablet Take 20 mg by mouth daily.    . temazepam (RESTORIL) 30 MG capsule Take 30 mg by mouth at bedtime.   . timolol (BETIMOL) 0.5 % ophthalmic solution Place 1 drop into both eyes 2 (two) times daily.    Marland Kitchen torsemide (DEMADEX) 20 MG tablet Take 2 tablets by mouth once daily   . [DISCONTINUED] temazepam (RESTORIL) 30 MG capsule Take 1 capsule (30 mg total) by mouth at bedtime as needed for sleep. (Patient not taking: Reported on 11/22/2017)    No facility-administered encounter medications on file as of 11/22/2017.      SIGNIFICANT DIAGNOSTIC EXAMS  PREVIOUS  01-21-16: ct of abdomen and pelvis: 1. Apparent wall thickening at the rectum could reflect mild proctitis. 2. Small bilateral pleural effusions noted. Bibasilar airspace opacities may reflect atelectasis or possibly infection. 3. Large right-sided hiatal hernia, containing most of the stomach. 4. Mild soft tissue inflammation suggested about the bladder. Would correlate for any evidence of cystitis. 5. Nonspecific soft tissue inflammation suggested about the transverse colon. 6. Mild inflammation about the gallbladder is nonspecific, given the underlying diffuse soft tissue inflammation about the abdomen. 7. Very large anterior abdominal wall defect again noted, with packing material and underlying soft tissue thickening. 8. Scattered diverticulosis along the distal sigmoid colon, without evidence of diverticulitis. 9. Scattered aortic atherosclerosis noted. 10. Right convex thoracolumbar scoliosis  noted.   01-21-16 ct of neck: 1. Findings consistent with right parotiditis. No abscess identified. No significant lymphadenopathy. 2. Partially visualized small right pleural effusion and dependent right upper lobe opacity which may represent associated pneumonia or atelectasis.  07-03-16: right hip x-ray:No evidence of fracture or dislocation.   07-03-16: lumbar spine x-ray: No evidence of fracture or subluxation along the lumbar spine. Right convex thoracolumbar scoliosis again noted, with underlying degenerative change.  09-05-17: chest x-ray: no acute cardiopulmonary disease process; large paraesophageal hernia  NO NEW EXAMS    LABS REVIEWED:PREVIOUS    11-28-16:glucose 83; bun 11.0; creat 0.65; k+ 4.2; na++ 133; ca 9.7 05-21-17: wbc 5.6; hgb 11.5; hct 34.3; mcv 98.0 plt 208 glucose 101; bun 9.8; creat 0.41; k+ 4.3; na++ 131; liver normal albumin 2.9 hgb a1c 5.1; chol 147; ldl 74; trig 77; hdl 57; ammonia 104 depakote 41  09-05-17: wbc 6.7; hgb 11.4; hct 33.9; mcv 100.6; plt 234   LABS REVIEWED TODAY.  10-05-17: glucose 120; bun 10.5; bun 0.54; k+ 3.5; na++ 138; ca 8.8 11-13-17: wbc 5.9; hgb 11.7; hct 35.1; mcv 98.6; plt 266; glucose 96; bun 10.9; creat 0.66; k+ 4.1; na++ 139; ca 9.8; liver normal albumin 3.1     Review of Systems  Constitutional: Negative for malaise/fatigue.  Respiratory: Negative for cough and shortness of breath.   Cardiovascular: Negative for chest pain, palpitations and leg swelling.  Gastrointestinal: Negative for abdominal pain, constipation and heartburn.  Musculoskeletal: Negative for back pain, joint pain and myalgias.  Skin: Negative.   Neurological: Negative for dizziness.  Psychiatric/Behavioral: The patient is not nervous/anxious.      Physical Exam  Constitutional: She is oriented to person, place, and time. She appears well-developed and well-nourished. No distress.  Obese   Neck: No thyromegaly present.  Cardiovascular: Normal rate, regular  rhythm, normal heart sounds and intact distal  pulses.  Pulmonary/Chest: Effort normal and breath sounds normal. No respiratory distress.  Abdominal: Soft. Bowel sounds are normal. She exhibits no distension. There is no tenderness.  Musculoskeletal: She exhibits no edema.  Is able to move upper extremities   Lymphadenopathy:    She has no cervical adenopathy.  Neurological: She is alert and oriented to person, place, and time.  Skin: Skin is warm and dry. She is not diaphoretic.  Psychiatric: She has a normal mood and affect.    ASSESSMENT/ PLAN:  TODAY:   1. Gerd: stable  will continue zantac 150 mg twice daily   2. Depression with anxiety:is stable  will continue cymbalta 90 mg daily takes depakote 500 mg nightly to stabilize mood. Will continue to monitor her status.   3. Glaucoma: is without change  will continue timolol to both eyes twice daily fluorometholone 1 drop both eyes three times daily timolol to both eyese twice daily zaditor 1 drop both eyes twice daily as needed for allergies   4. Seasonal allergic rhinitis: is stable  will continue flonase daily claritin 10 mg daily  and mucinex twice  daily   PREVIOUS   5. Pulmonary arterial hypertension: stable  b/p 120/82 is currently not on medications;  no changes will monitor   6.  Constipation due to opioid therapy: stable  will continue MOM 30 cc every other day as needed;  miralax  17 gm twice daily  will monitor   7. Chronic pain syndrome: has chronic bilateral low back pain without sciatica:  Is not being adequately managed  is on chronic narcotic therapy: will continue  neurontin 300 mg twice daily   voltaren gel 1 gm twice daily to knees oxycodone 5 every 4 hours as needed  methadone 5 mg twice daily and robaxin 750 mg twice daily   8. Insomnia: is stable will continue  restoril  30 mg nightly she has not tolerated lower doses    9. Deep vein thrombosis of femoral vein of both lower extremities :is stable  will  continue xarelto 20 mg daily    10. CHF, chronic:EF 55-60% (06-21-14) is stable will continue demadex 40 mg daily uses 02 as needed      MD is aware of resident's narcotic use and is in agreement with current plan of care. We will attempt to wean resident as apropriate   Ok Edwards NP [facility]  Contact [phone] Monday through Friday 8am- 5pm  After hours call [phone]

## 2017-11-29 DIAGNOSIS — F331 Major depressive disorder, recurrent, moderate: Secondary | ICD-10-CM | POA: Diagnosis not present

## 2017-11-29 DIAGNOSIS — F5101 Primary insomnia: Secondary | ICD-10-CM | POA: Diagnosis not present

## 2017-12-18 ENCOUNTER — Other Ambulatory Visit: Payer: Self-pay

## 2017-12-18 MED ORDER — TEMAZEPAM 30 MG PO CAPS: 30.0000 mg | ORAL_CAPSULE | Freq: Every day | ORAL | 0 refills | Status: DC

## 2017-12-18 NOTE — Telephone Encounter (Signed)
Rx faxed to [facility] (P) [phone], (F) [phone] 

## 2017-12-19 ENCOUNTER — Encounter: Payer: Self-pay | Admitting: Adult Health

## 2017-12-19 ENCOUNTER — Non-Acute Institutional Stay (SKILLED_NURSING_FACILITY): Payer: PPO | Admitting: Adult Health

## 2017-12-19 DIAGNOSIS — K5903 Drug induced constipation: Secondary | ICD-10-CM

## 2017-12-19 DIAGNOSIS — T402X5A Adverse effect of other opioids, initial encounter: Secondary | ICD-10-CM

## 2017-12-19 DIAGNOSIS — G4700 Insomnia, unspecified: Secondary | ICD-10-CM | POA: Diagnosis not present

## 2017-12-19 DIAGNOSIS — G894 Chronic pain syndrome: Secondary | ICD-10-CM

## 2017-12-19 DIAGNOSIS — M545 Low back pain: Secondary | ICD-10-CM

## 2017-12-19 DIAGNOSIS — G8929 Other chronic pain: Secondary | ICD-10-CM

## 2017-12-19 DIAGNOSIS — I2721 Secondary pulmonary arterial hypertension: Secondary | ICD-10-CM

## 2017-12-19 NOTE — Progress Notes (Signed)
Location:   [facility] Room Number: 201 A Place of Service:  SNF (31)   CODE STATUS: DNR (Most form updated 12-20-16)  Allergies  Allergen Reactions  . Tape Other (See Comments)    SKIN IS VERY THIN; IT BRUISES AND TEARS EASILY!! -THX    Chief Complaint  Patient presents with  . Medical Management of Chronic Issues    Pulmonary hypertension; constipation; chronic back pain and chronic pain syndrome; insomnia    HPI:  She is a 84 year old long term resident of this facility being seen for the management of her chronic illnesses: pulmonary hypertension; constipation; back and chronic pain; insomnia. She denies any uncontrolled back pain; no changes in appetite; no insomnia.   Past Medical History:  Diagnosis Date  . Anemia   . Arthritis   . CHF (congestive heart failure) ([place])   . Chronic bilateral low back pain without sciatica   . Depression   . GERD (gastroesophageal reflux disease)   . Hyponatremia 01/2016  . Insomnia 05/20/2016  . Perforated chronic gastric ulcer ([place]) 08/11/2014  . Pneumonia   . Pulmonary arterial hypertension ([place]) 11/28/2014    Past Surgical History:  Procedure Laterality Date  . ABDOMINAL HYSTERECTOMY    . ABDOMINAL SURGERY    . BACK SURGERY  2000  . COLON SURGERY     x2-blockage  . COLONOSCOPY    . ERCP    . HERNIA REPAIR  2012   x2 with mesh  . JOINT REPLACEMENT Left    shoulder  . LAPAROTOMY N/A 08/04/2014   Procedure: EXPLORATORY LAPAROTOMY, REPAIR OF PERFORATED ULCER;  Surgeon: Excell Seltzer, MD;  Location: [facility];  Service: General;  Laterality: N/A;  . LAPAROTOMY N/A 12/21/2015   Procedure: EXPLORATORY LAPAROTOMY/ REMOVAL OF MESH;  Surgeon: Coralie Keens, MD;  Location: [place];  Service: General;  Laterality: N/A;  . ORIF WRIST FRACTURE  2012   left  . REVERSE SHOULDER ARTHROPLASTY Right 12/04/2013  . REVERSE SHOULDER ARTHROPLASTY  12/04/2013   Procedure: REVERSE SHOULDER ARTHROPLASTY;  Surgeon: Nita Sells, MD;  Location: [facility] OR;  Service: Orthopedics;;  Right reverse total shoulder arthroplasty  . ROTATOR CUFF REPAIR     dr Tamera Punt  . SHOULDER ARTHROSCOPY  2012   lt and rt  . TENDON REPAIR Right 04/13/2014   Procedure: RIGHT HAND CENTRAL TENDON CENTRALIZATION OF LONG AND RING FINGERS;  Surgeon: Jolyn Nap, MD;  Location: [place];  Service: Orthopedics;  Laterality: Right;    Social History   Socioeconomic History  . Marital status: Married    Spouse name: Not on file  . Number of children: Not on file  . Years of education: Not on file  . Highest education level: Not on file  Occupational History  . Not on file  Social Needs  . Financial resource strain: Not hard at all  . Food insecurity:    Worry: Never true    Inability: Never true  . Transportation needs:    Medical: No    Non-medical: No  Tobacco Use  . Smoking status: Never Smoker  . Smokeless tobacco: Never Used  Substance and Sexual Activity  . Alcohol use: Not Currently    Alcohol/week: 7.0 standard drinks    Types: 7 Glasses of wine per week    Comment: 8 oz -12 oz of wine every   . Drug use: No  . Sexual activity: Not on file  Lifestyle  . Physical activity:  Days per week: 0 days    Minutes per session: 0 min  . Stress: To some extent  Relationships  . Social connections:    Talks on phone: More than three times a week    Gets together: Twice a week    Attends religious service: Never    Active member of club or organization: No    Attends meetings of clubs or organizations: Never    Relationship status: Married  . Intimate partner violence:    Fear of current or ex partner: No    Emotionally abused: No    Physically abused: No    Forced sexual activity: No  Other Topics Concern  . Not on file  Social History Narrative  . Not on file   Family History  Problem Relation Age of Onset  . Stroke Mother   . Breast cancer Sister   . Ulcerative colitis Daughter   .  Chronic fatigue Daughter   . Chronic fatigue Daughter       VITAL SIGNS BP 112/68   Pulse 68   Temp 97.6 F (36.4 C)   Resp 18   Ht 5\' 3"  (1.6 m)   Wt 219 lb (99.3 kg)   SpO2 97%   BMI 38.79 kg/m   Outpatient Encounter Medications as of 12/19/2017  Medication Sig Note  . acetaminophen (TYLENOL) 325 MG tablet Take 325 mg by mouth every 6 (six) hours as needed for mild pain.    . bisacodyl (DULCOLAX) 10 MG suppository Place 1 suppository (10 mg total) rectally daily as needed for moderate constipation (May repeat times one).   Marland Kitchen diclofenac sodium (VOLTAREN) 1 % GEL Apply 1 g topically 2 (two) times daily. Apply to bilateral knees for pain   . divalproex (DEPAKOTE SPRINKLE) 125 MG capsule Give 4 Capsules (500 mg) by mouth at bedtime   . DULOXETINE HCL PO Take 90 mg by mouth at bedtime.    . fluticasone (FLONASE) 50 MCG/ACT nasal spray Place 1 spray into both nostrils at bedtime.   . gabapentin (NEURONTIN) 300 MG capsule Take 300 mg by mouth 2 (two) times daily.   Marland Kitchen ketotifen (ZADITOR) 0.025 % ophthalmic solution Place 1 drop into both eyes 2 (two) times daily as needed.   . loratadine (CLARITIN) 10 MG tablet Take 10 mg by mouth daily.   . magnesium hydroxide (MILK OF MAGNESIA) 400 MG/5ML suspension Take 60 mLs by mouth every other day. AS NEEDED FOR CONSTIPATION 08/04/2014: .   Marland Kitchen Melatonin 3 MG TABS Take 2 tablets by mouth at bedtime.    . methadone (DOLOPHINE) 5 MG tablet Take 1 tablet (5 mg total) by mouth every 12 (twelve) hours.   . methocarbamol (ROBAXIN) 750 MG tablet Take 750 mg by mouth every 12 (twelve) hours.    . Multiple Vitamin (MULTIVITAMIN) tablet Take 1 tablet by mouth 3 (three) times daily.    . Nutritional Supplements (NUTRITIONAL SUPPLEMENT PO) Regular Diet - Mechanical soft texture   . oxyCODONE (ROXICODONE) 5 MG immediate release tablet Take 1 tablet (5 mg total) by mouth every 4 (four) hours as needed for severe pain.   . polyethylene glycol (MIRALAX / GLYCOLAX)  packet Take 17 g by mouth 2 (two) times daily.    . pseudoephedrine (SUDAFED) 30 MG tablet Take 30 mg by mouth every 6 (six) hours as needed for congestion.   . ranitidine (ZANTAC) 150 MG tablet Take 150 mg by mouth 2 (two) times daily.   . rivaroxaban (XARELTO) 20  MG TABS tablet Take 20 mg by mouth daily.    . temazepam (RESTORIL) 30 MG capsule Take 1 capsule (30 mg total) by mouth at bedtime.   . timolol (BETIMOL) 0.5 % ophthalmic solution Place 1 drop into both eyes 2 (two) times daily.    Marland Kitchen torsemide (DEMADEX) 20 MG tablet Take 2 tablets by mouth once daily   . [DISCONTINUED] fluorometholone (FML) 0.1 % ophthalmic suspension Place 1 drop into both eyes 3 (three) times daily.   . [DISCONTINUED] OXYGEN Inhale into the lungs. 2-6 L/min to keep stat at 90 or above    No facility-administered encounter medications on file as of 12/19/2017.      SIGNIFICANT DIAGNOSTIC EXAMS  PREVIOUS  01-21-16: ct of abdomen and pelvis: 1. Apparent wall thickening at the rectum could reflect mild proctitis. 2. Small bilateral pleural effusions noted. Bibasilar airspace opacities may reflect atelectasis or possibly infection. 3. Large right-sided hiatal hernia, containing most of the stomach. 4. Mild soft tissue inflammation suggested about the bladder. Would correlate for any evidence of cystitis. 5. Nonspecific soft tissue inflammation suggested about the transverse colon. 6. Mild inflammation about the gallbladder is nonspecific, given the underlying diffuse soft tissue inflammation about the abdomen. 7. Very large anterior abdominal wall defect again noted, with packing material and underlying soft tissue thickening. 8. Scattered diverticulosis along the distal sigmoid colon, without evidence of diverticulitis. 9. Scattered aortic atherosclerosis noted. 10. Right convex thoracolumbar scoliosis noted.   01-21-16 ct of neck: 1. Findings consistent with right parotiditis. No abscess identified. No significant  lymphadenopathy. 2. Partially visualized small right pleural effusion and dependent right upper lobe opacity which may represent associated pneumonia or atelectasis.  07-03-16: right hip x-ray:No evidence of fracture or dislocation.   07-03-16: lumbar spine x-ray: No evidence of fracture or subluxation along the lumbar spine. Right convex thoracolumbar scoliosis again noted, with underlying degenerative change.  09-05-17: chest x-ray: no acute cardiopulmonary disease process; large paraesophageal hernia  NO NEW EXAMS    LABS REVIEWED:PREVIOUS    05-21-17: wbc 5.6; hgb 11.5; hct 34.3; mcv 98.0 plt 208 glucose 101; bun 9.8; creat 0.41; k+ 4.3; na++ 131; liver normal albumin 2.9 hgb a1c 5.1; chol 147; ldl 74; trig 77; hdl 57; ammonia 104 depakote 41  09-05-17: wbc 6.7; hgb 11.4; hct 33.9; mcv 100.6; plt 234  10-05-17: glucose 120; bun 10.5; bun 0.54; k+ 3.5; na++ 138; ca 8.8 11-13-17: wbc 5.9; hgb 11.7; hct 35.1; mcv 98.6; plt 266; glucose 96; bun 10.9; creat 0.66; k+ 4.1; na++ 139; ca 9.8; liver normal albumin 3.1   NO NEW LABS.     Review of Systems  Constitutional: Negative for malaise/fatigue.  Respiratory: Negative for cough and shortness of breath.   Cardiovascular: Negative for chest pain, palpitations and leg swelling.  Gastrointestinal: Negative for abdominal pain, constipation and heartburn.  Musculoskeletal: Negative for back pain, joint pain and myalgias.  Skin: Negative.   Neurological: Negative for dizziness.  Psychiatric/Behavioral: The patient is not nervous/anxious.     Physical Exam  Constitutional: She is oriented to person, place, and time. She appears well-developed and well-nourished. No distress.  Obese   Neck: No thyromegaly present.  Cardiovascular: Normal rate, regular rhythm, normal heart sounds and intact distal pulses.  Pulmonary/Chest: Effort normal and breath sounds normal. No respiratory distress.  Abdominal: Soft. Bowel sounds are normal. She exhibits no  distension. There is no tenderness.  Musculoskeletal: She exhibits edema.  Is able to move upper extremities Has trace bilateral  lower extremity edema   Lymphadenopathy:    She has no cervical adenopathy.  Neurological: She is alert and oriented to person, place, and time.  Skin: Skin is warm and dry. She is not diaphoretic.  Psychiatric: She has a normal mood and affect.     ASSESSMENT/ PLAN:  TODAY:   1. Pulmonary arterial hypertension: stable  b/p 112/68 is currently not on medications;  no changes will monitor   2.  Constipation due to opioid therapy: stable  will continue MOM 30 cc every other day as needed;  miralax  17 gm twice daily  will monitor   3. Chronic pain syndrome: has chronic bilateral low back pain without sciatica:  Is not being adequately managed  is on chronic narcotic therapy: will continue  neurontin 300 mg twice daily   voltaren gel 1 gm twice daily to knees oxycodone 5 every 4 hours as needed  methadone 5 mg twice daily and robaxin 750 mg twice daily   4. Insomnia: is stable will continue  restoril  30 mg nightly she has not tolerated lower doses    PREVIOUS   5. Deep vein thrombosis of femoral vein of both lower extremities :is stable  will continue xarelto 20 mg daily    6. CHF, chronic:EF 55-60% (06-21-14) is stable will continue demadex 40 mg daily uses 02 as needed  7. Gerd: stable  will continue zantac 150 mg twice daily   8. Depression with anxiety:is stable  will continue cymbalta 90 mg daily takes depakote 500 mg nightly to stabilize mood. Will continue to monitor her status.   9. Glaucoma: is without change  will continue timolol to both eyes twice daily fluorometholone 1 drop both eyes three times daily timolol to both eyese twice daily zaditor 1 drop both eyes twice daily as needed for allergies   10. Seasonal allergic rhinitis: is stable  will continue flonase daily claritin 10 mg daily  and mucinex twice  daily    MD is aware of resident's  narcotic use and is in agreement with current plan of care. We will attempt to wean resident as apropriate   Ok Edwards NP [facility]  Contact [phone] Monday through Friday 8am- 5pm  After hours call [phone]

## 2017-12-24 ENCOUNTER — Other Ambulatory Visit: Payer: Self-pay

## 2017-12-24 MED ORDER — METHADONE HCL 5 MG PO TABS: 5.0000 mg | ORAL_TABLET | Freq: Two times a day (BID) | ORAL | 0 refills | Status: DC

## 2017-12-24 NOTE — Telephone Encounter (Signed)
Rx faxed to [facility] (P) [phone], (F) [phone] 

## 2017-12-27 DIAGNOSIS — F331 Major depressive disorder, recurrent, moderate: Secondary | ICD-10-CM | POA: Diagnosis not present

## 2017-12-27 DIAGNOSIS — F5101 Primary insomnia: Secondary | ICD-10-CM | POA: Diagnosis not present

## 2018-01-09 ENCOUNTER — Non-Acute Institutional Stay (SKILLED_NURSING_FACILITY): Payer: PPO | Admitting: Adult Health

## 2018-01-09 ENCOUNTER — Encounter: Payer: Self-pay | Admitting: Adult Health

## 2018-01-09 DIAGNOSIS — H1033 Unspecified acute conjunctivitis, bilateral: Secondary | ICD-10-CM

## 2018-01-09 DIAGNOSIS — I509 Heart failure, unspecified: Secondary | ICD-10-CM

## 2018-01-09 DIAGNOSIS — I5032 Chronic diastolic (congestive) heart failure: Secondary | ICD-10-CM | POA: Diagnosis not present

## 2018-01-09 LAB — HEPATIC FUNCTION PANEL
ALK PHOS: 90 (ref 25–125)
ALT: 5 — AB (ref 7–35)
AST: 12 — AB (ref 13–35)
BILIRUBIN, TOTAL: 0.2

## 2018-01-09 LAB — CBC AND DIFFERENTIAL
HEMATOCRIT: 37 (ref 36–46)
HEMOGLOBIN: 11.9 — AB (ref 12.0–16.0)
Neutrophils Absolute: 10
Platelets: 258 (ref 150–399)
WBC: 11.9

## 2018-01-09 LAB — BASIC METABOLIC PANEL
BUN: 9 (ref 4–21)
Creatinine: 0.6 (ref 0.5–1.1)
GLUCOSE: 120
POTASSIUM: 4.3 (ref 3.4–5.3)
SODIUM: 137 (ref 137–147)

## 2018-01-09 NOTE — Progress Notes (Signed)
Location:   [facility] Room Number: 852 A Place of Service:  SNF (31)   CODE STATUS: DNR  Allergies  Allergen Reactions  . Tape Other (See Comments)    SKIN IS VERY THIN; IT BRUISES AND TEARS EASILY!! -THX    Chief Complaint  Patient presents with  . Acute Visit    Edema    HPI:  She is complaining of worsening edema and bilateral eye drainage. She states over the past several she has noted that her edema is getting worse and feels swollen "all over". She has noted over the past several days that she has bilateral eye drainage. She is complaining of chills and cough.   Past Medical History:  Diagnosis Date  . Anemia   . Arthritis   . CHF (congestive heart failure) ([place])   . Chronic bilateral low back pain without sciatica   . Depression   . GERD (gastroesophageal reflux disease)   . Hyponatremia 01/2016  . Insomnia 05/20/2016  . Perforated chronic gastric ulcer ([place]) 08/11/2014  . Pneumonia   . Pulmonary arterial hypertension ([place]) 11/28/2014    Past Surgical History:  Procedure Laterality Date  . ABDOMINAL HYSTERECTOMY    . ABDOMINAL SURGERY    . BACK SURGERY  2000  . COLON SURGERY     x2-blockage  . COLONOSCOPY    . ERCP    . HERNIA REPAIR  2012   x2 with mesh  . JOINT REPLACEMENT Left    shoulder  . LAPAROTOMY N/A 08/04/2014   Procedure: EXPLORATORY LAPAROTOMY, REPAIR OF PERFORATED ULCER;  Surgeon: Excell Seltzer, MD;  Location: [facility];  Service: General;  Laterality: N/A;  . LAPAROTOMY N/A 12/21/2015   Procedure: EXPLORATORY LAPAROTOMY/ REMOVAL OF MESH;  Surgeon: Coralie Keens, MD;  Location: [place];  Service: General;  Laterality: N/A;  . ORIF WRIST FRACTURE  2012   left  . REVERSE SHOULDER ARTHROPLASTY Right 12/04/2013  . REVERSE SHOULDER ARTHROPLASTY  12/04/2013   Procedure: REVERSE SHOULDER ARTHROPLASTY;  Surgeon: Nita Sells, MD;  Location: [facility] OR;  Service: Orthopedics;;  Right reverse total shoulder arthroplasty  .  ROTATOR CUFF REPAIR     dr Tamera Punt  . SHOULDER ARTHROSCOPY  2012   lt and rt  . TENDON REPAIR Right 04/13/2014   Procedure: RIGHT HAND CENTRAL TENDON CENTRALIZATION OF LONG AND RING FINGERS;  Surgeon: Jolyn Nap, MD;  Location: [place];  Service: Orthopedics;  Laterality: Right;    Social History   Socioeconomic History  . Marital status: Married    Spouse name: Not on file  . Number of children: Not on file  . Years of education: Not on file  . Highest education level: Not on file  Occupational History  . Not on file  Social Needs  . Financial resource strain: Not hard at all  . Food insecurity:    Worry: Never true    Inability: Never true  . Transportation needs:    Medical: No    Non-medical: No  Tobacco Use  . Smoking status: Never Smoker  . Smokeless tobacco: Never Used  Substance and Sexual Activity  . Alcohol use: Not Currently    Alcohol/week: 7.0 standard drinks    Types: 7 Glasses of wine per week    Comment: 8 oz -12 oz of wine every   . Drug use: No  . Sexual activity: Not on file  Lifestyle  . Physical activity:    Days per week: 0  days    Minutes per session: 0 min  . Stress: To some extent  Relationships  . Social connections:    Talks on phone: More than three times a week    Gets together: Twice a week    Attends religious service: Never    Active member of club or organization: No    Attends meetings of clubs or organizations: Never    Relationship status: Married  . Intimate partner violence:    Fear of current or ex partner: No    Emotionally abused: No    Physically abused: No    Forced sexual activity: No  Other Topics Concern  . Not on file  Social History Narrative  . Not on file   Family History  Problem Relation Age of Onset  . Stroke Mother   . Breast cancer Sister   . Ulcerative colitis Daughter   . Chronic fatigue Daughter   . Chronic fatigue Daughter       VITAL SIGNS Ht 5\' 3"  (1.6 m)   Wt  219 lb (99.3 kg)   BMI 38.79 kg/m  none other available   Outpatient Encounter Medications as of 01/09/2018  Medication Sig Note  . acetaminophen (TYLENOL) 325 MG tablet Take 325 mg by mouth every 6 (six) hours as needed for mild pain.    . bisacodyl (DULCOLAX) 10 MG suppository Place 1 suppository (10 mg total) rectally daily as needed for moderate constipation (May repeat times one).   Marland Kitchen diclofenac sodium (VOLTAREN) 1 % GEL Apply 1 g topically 2 (two) times daily. Apply to bilateral knees for pain   . divalproex (DEPAKOTE SPRINKLE) 125 MG capsule Give 4 Capsules (500 mg) by mouth at bedtime   . DULOXETINE HCL PO Take 90 mg by mouth at bedtime.    . fluticasone (FLONASE) 50 MCG/ACT nasal spray Place 1 spray into both nostrils at bedtime.   . gabapentin (NEURONTIN) 300 MG capsule Take 300 mg by mouth 2 (two) times daily.   Marland Kitchen ketotifen (ZADITOR) 0.025 % ophthalmic solution Place 1 drop into both eyes 2 (two) times daily as needed.   . loratadine (CLARITIN) 10 MG tablet Take 10 mg by mouth daily.   . magnesium hydroxide (MILK OF MAGNESIA) 400 MG/5ML suspension Take 60 mLs by mouth every other day. AS NEEDED FOR CONSTIPATION 08/04/2014: .   Marland Kitchen Melatonin 3 MG TABS Take 2 tablets by mouth at bedtime.    . methadone (DOLOPHINE) 5 MG tablet Take 1 tablet (5 mg total) by mouth every 12 (twelve) hours.   . methocarbamol (ROBAXIN) 750 MG tablet Take 750 mg by mouth every 12 (twelve) hours.    . Multiple Vitamin (MULTIVITAMIN) tablet Take 1 tablet by mouth 3 (three) times daily.    . Nutritional Supplements (NUTRITIONAL SUPPLEMENT PO) Regular Diet - Mechanical soft texture   . oxyCODONE (ROXICODONE) 5 MG immediate release tablet Take 1 tablet (5 mg total) by mouth every 4 (four) hours as needed for severe pain.   . polyethylene glycol (MIRALAX / GLYCOLAX) packet Take 17 g by mouth 2 (two) times daily.    . pseudoephedrine (SUDAFED) 30 MG tablet Take 30 mg by mouth every 6 (six) hours as needed for  congestion.   . ranitidine (ZANTAC) 150 MG tablet Take 150 mg by mouth 2 (two) times daily.   . rivaroxaban (XARELTO) 20 MG TABS tablet Take 20 mg by mouth daily.    . timolol (BETIMOL) 0.5 % ophthalmic solution Place 1 drop into  both eyes 2 (two) times daily.    Marland Kitchen torsemide (DEMADEX) 20 MG tablet Take 2 tablets by mouth once daily   . [DISCONTINUED] temazepam (RESTORIL) 30 MG capsule Take 1 capsule (30 mg total) by mouth at bedtime.    No facility-administered encounter medications on file as of 01/09/2018.      SIGNIFICANT DIAGNOSTIC EXAMS  PREVIOUS  01-21-16: ct of abdomen and pelvis: 1. Apparent wall thickening at the rectum could reflect mild proctitis. 2. Small bilateral pleural effusions noted. Bibasilar airspace opacities may reflect atelectasis or possibly infection. 3. Large right-sided hiatal hernia, containing most of the stomach. 4. Mild soft tissue inflammation suggested about the bladder. Would correlate for any evidence of cystitis. 5. Nonspecific soft tissue inflammation suggested about the transverse colon. 6. Mild inflammation about the gallbladder is nonspecific, given the underlying diffuse soft tissue inflammation about the abdomen. 7. Very large anterior abdominal wall defect again noted, with packing material and underlying soft tissue thickening. 8. Scattered diverticulosis along the distal sigmoid colon, without evidence of diverticulitis. 9. Scattered aortic atherosclerosis noted. 10. Right convex thoracolumbar scoliosis noted.   01-21-16 ct of neck: 1. Findings consistent with right parotiditis. No abscess identified. No significant lymphadenopathy. 2. Partially visualized small right pleural effusion and dependent right upper lobe opacity which may represent associated pneumonia or atelectasis.  07-03-16: right hip x-ray:No evidence of fracture or dislocation.   07-03-16: lumbar spine x-ray: No evidence of fracture or subluxation along the lumbar spine. Right  convex thoracolumbar scoliosis again noted, with underlying degenerative change.  09-05-17: chest x-ray: no acute cardiopulmonary disease process; large paraesophageal hernia  NO NEW EXAMS    LABS REVIEWED:PREVIOUS    05-21-17: wbc 5.6; hgb 11.5; hct 34.3; mcv 98.0 plt 208 glucose 101; bun 9.8; creat 0.41; k+ 4.3; na++ 131; liver normal albumin 2.9 hgb a1c 5.1; chol 147; ldl 74; trig 77; hdl 57; ammonia 104 depakote 41  09-05-17: wbc 6.7; hgb 11.4; hct 33.9; mcv 100.6; plt 234  10-05-17: glucose 120; bun 10.5; bun 0.54; k+ 3.5; na++ 138; ca 8.8 11-13-17: wbc 5.9; hgb 11.7; hct 35.1; mcv 98.6; plt 266; glucose 96; bun 10.9; creat 0.66; k+ 4.1; na++ 139; ca 9.8; liver normal albumin 3.1   NO NEW LABS.    Review of Systems  Constitutional: Positive for chills. Negative for malaise/fatigue.  Eyes:       Bilateral eye drainage  Respiratory: Positive for cough. Negative for shortness of breath.   Cardiovascular: Positive for leg swelling. Negative for chest pain and palpitations.       Swelling is all over   Gastrointestinal: Negative for abdominal pain, constipation and heartburn.  Musculoskeletal: Negative for back pain, joint pain and myalgias.  Skin: Negative.   Neurological: Negative for dizziness.  Psychiatric/Behavioral: The patient is not nervous/anxious.       Physical Exam  Constitutional: She is oriented to person, place, and time. She appears well-developed and well-nourished. No distress.  Obese  Eyes:  Bilateral eye redness with scant drainage present.   Neck: No thyromegaly present.  Cardiovascular: Normal rate, regular rhythm, normal heart sounds and intact distal pulses.  Pulmonary/Chest: Effort normal. No respiratory distress.  Slightly diminished bases   Abdominal: Soft. Bowel sounds are normal. She exhibits no distension. There is no tenderness.  Musculoskeletal: She exhibits edema.  Is able to move upper extremities Has trace bilateral lower extremity edema   Has  4+ generalized edema   Lymphadenopathy:    She has no cervical adenopathy.  Neurological: She is alert and oriented to person, place, and time.  Skin: Skin is warm and dry. She is not diaphoretic. There is pallor.  Psychiatric: She has a normal mood and affect.     ASSESSMENT/ PLAN:  TODAY:   1.  CHF, chronic:EF 55-60% (06-21-14)  2. Bilateral acute conjunctivitis  Will give demadex 40 mg now and then twice daily  Will begin k+ 10 meq daily Will begin tobradex to both eyes four times daily for 7 days Will get chest x-ray; bilateral lower extremity venous doppler cbc; cmp; BNP       MD is aware of resident's narcotic use and is in agreement with current plan of care. We will attempt to wean resident as apropriate   Ok Edwards NP [facility]  Contact [phone] Monday through Friday 8am- 5pm  After hours call [phone]

## 2018-01-14 ENCOUNTER — Other Ambulatory Visit: Payer: Self-pay

## 2018-01-14 MED ORDER — TEMAZEPAM 30 MG PO CAPS: 30.0000 mg | ORAL_CAPSULE | Freq: Every day | ORAL | 0 refills | Status: DC

## 2018-01-14 NOTE — Telephone Encounter (Signed)
Rx faxed to [facility] (P) [phone], (F) [phone] 

## 2018-01-15 DIAGNOSIS — H1033 Unspecified acute conjunctivitis, bilateral: Secondary | ICD-10-CM | POA: Insufficient documentation

## 2018-01-31 DIAGNOSIS — R600 Localized edema: Secondary | ICD-10-CM | POA: Diagnosis not present

## 2018-01-31 DIAGNOSIS — M6281 Muscle weakness (generalized): Secondary | ICD-10-CM | POA: Diagnosis not present

## 2018-01-31 DIAGNOSIS — F329 Major depressive disorder, single episode, unspecified: Secondary | ICD-10-CM | POA: Diagnosis not present

## 2018-01-31 DIAGNOSIS — K259 Gastric ulcer, unspecified as acute or chronic, without hemorrhage or perforation: Secondary | ICD-10-CM | POA: Diagnosis not present

## 2018-01-31 DIAGNOSIS — M545 Low back pain: Secondary | ICD-10-CM | POA: Diagnosis not present

## 2018-02-02 DIAGNOSIS — I10 Essential (primary) hypertension: Secondary | ICD-10-CM | POA: Diagnosis not present

## 2018-02-02 DIAGNOSIS — Z79899 Other long term (current) drug therapy: Secondary | ICD-10-CM | POA: Diagnosis not present

## 2018-02-02 DIAGNOSIS — D649 Anemia, unspecified: Secondary | ICD-10-CM | POA: Diagnosis not present

## 2018-02-08 DIAGNOSIS — D649 Anemia, unspecified: Secondary | ICD-10-CM | POA: Diagnosis not present

## 2018-02-08 DIAGNOSIS — Z79899 Other long term (current) drug therapy: Secondary | ICD-10-CM | POA: Diagnosis not present

## 2018-02-08 DIAGNOSIS — F329 Major depressive disorder, single episode, unspecified: Secondary | ICD-10-CM | POA: Diagnosis not present

## 2018-02-08 DIAGNOSIS — K259 Gastric ulcer, unspecified as acute or chronic, without hemorrhage or perforation: Secondary | ICD-10-CM | POA: Diagnosis not present

## 2018-02-08 DIAGNOSIS — R600 Localized edema: Secondary | ICD-10-CM | POA: Diagnosis not present

## 2018-02-15 DIAGNOSIS — K259 Gastric ulcer, unspecified as acute or chronic, without hemorrhage or perforation: Secondary | ICD-10-CM | POA: Diagnosis not present

## 2018-02-15 DIAGNOSIS — F329 Major depressive disorder, single episode, unspecified: Secondary | ICD-10-CM | POA: Diagnosis not present

## 2018-02-15 DIAGNOSIS — R600 Localized edema: Secondary | ICD-10-CM | POA: Diagnosis not present

## 2018-02-21 ENCOUNTER — Emergency Department (HOSPITAL_COMMUNITY): Payer: PPO

## 2018-02-21 ENCOUNTER — Other Ambulatory Visit: Payer: Self-pay

## 2018-02-21 ENCOUNTER — Inpatient Hospital Stay (HOSPITAL_COMMUNITY)
Admission: EM | Admit: 2018-02-21 | Discharge: 2018-02-27 | DRG: 871 | Disposition: A | Discharge status: Skilled Nursing Facility | Payer: PPO | Attending: Internal Medicine | Admitting: Internal Medicine

## 2018-02-21 DIAGNOSIS — R918 Other nonspecific abnormal finding of lung field: Secondary | ICD-10-CM | POA: Diagnosis not present

## 2018-02-21 DIAGNOSIS — Y95 Nosocomial condition: Secondary | ICD-10-CM | POA: Diagnosis present

## 2018-02-21 DIAGNOSIS — E669 Obesity, unspecified: Secondary | ICD-10-CM | POA: Diagnosis not present

## 2018-02-21 DIAGNOSIS — Z7401 Bed confinement status: Secondary | ICD-10-CM | POA: Diagnosis not present

## 2018-02-21 DIAGNOSIS — A419 Sepsis, unspecified organism: Secondary | ICD-10-CM | POA: Diagnosis not present

## 2018-02-21 DIAGNOSIS — I5032 Chronic diastolic (congestive) heart failure: Secondary | ICD-10-CM | POA: Diagnosis present

## 2018-02-21 DIAGNOSIS — Z90710 Acquired absence of both cervix and uterus: Secondary | ICD-10-CM

## 2018-02-21 DIAGNOSIS — I361 Nonrheumatic tricuspid (valve) insufficiency: Secondary | ICD-10-CM | POA: Diagnosis not present

## 2018-02-21 DIAGNOSIS — G40909 Epilepsy, unspecified, not intractable, without status epilepticus: Secondary | ICD-10-CM | POA: Diagnosis not present

## 2018-02-21 DIAGNOSIS — Z79899 Other long term (current) drug therapy: Secondary | ICD-10-CM

## 2018-02-21 DIAGNOSIS — G894 Chronic pain syndrome: Secondary | ICD-10-CM | POA: Diagnosis present

## 2018-02-21 DIAGNOSIS — R4182 Altered mental status, unspecified: Secondary | ICD-10-CM | POA: Diagnosis not present

## 2018-02-21 DIAGNOSIS — Z6839 Body mass index (BMI) 39.0-39.9, adult: Secondary | ICD-10-CM

## 2018-02-21 DIAGNOSIS — R404 Transient alteration of awareness: Secondary | ICD-10-CM | POA: Diagnosis not present

## 2018-02-21 DIAGNOSIS — R531 Weakness: Secondary | ICD-10-CM | POA: Diagnosis not present

## 2018-02-21 DIAGNOSIS — J9601 Acute respiratory failure with hypoxia: Secondary | ICD-10-CM | POA: Diagnosis not present

## 2018-02-21 DIAGNOSIS — Z7901 Long term (current) use of anticoagulants: Secondary | ICD-10-CM | POA: Diagnosis not present

## 2018-02-21 DIAGNOSIS — E872 Acidosis: Secondary | ICD-10-CM | POA: Diagnosis present

## 2018-02-21 DIAGNOSIS — R509 Fever, unspecified: Secondary | ICD-10-CM | POA: Diagnosis not present

## 2018-02-21 DIAGNOSIS — Z86718 Personal history of other venous thrombosis and embolism: Secondary | ICD-10-CM

## 2018-02-21 DIAGNOSIS — E876 Hypokalemia: Secondary | ICD-10-CM | POA: Diagnosis not present

## 2018-02-21 DIAGNOSIS — R000 Tachycardia, unspecified: Secondary | ICD-10-CM | POA: Diagnosis not present

## 2018-02-21 DIAGNOSIS — A4150 Gram-negative sepsis, unspecified: Principal | ICD-10-CM | POA: Diagnosis present

## 2018-02-21 DIAGNOSIS — Z66 Do not resuscitate: Secondary | ICD-10-CM | POA: Diagnosis present

## 2018-02-21 DIAGNOSIS — I2721 Secondary pulmonary arterial hypertension: Secondary | ICD-10-CM | POA: Diagnosis present

## 2018-02-21 DIAGNOSIS — E8809 Other disorders of plasma-protein metabolism, not elsewhere classified: Secondary | ICD-10-CM | POA: Diagnosis not present

## 2018-02-21 DIAGNOSIS — D72829 Elevated white blood cell count, unspecified: Secondary | ICD-10-CM | POA: Diagnosis not present

## 2018-02-21 DIAGNOSIS — R0902 Hypoxemia: Secondary | ICD-10-CM | POA: Diagnosis not present

## 2018-02-21 DIAGNOSIS — F418 Other specified anxiety disorders: Secondary | ICD-10-CM | POA: Diagnosis not present

## 2018-02-21 DIAGNOSIS — G9341 Metabolic encephalopathy: Secondary | ICD-10-CM | POA: Diagnosis not present

## 2018-02-21 DIAGNOSIS — Z7951 Long term (current) use of inhaled steroids: Secondary | ICD-10-CM

## 2018-02-21 DIAGNOSIS — I370 Nonrheumatic pulmonary valve stenosis: Secondary | ICD-10-CM | POA: Diagnosis not present

## 2018-02-21 DIAGNOSIS — Z96611 Presence of right artificial shoulder joint: Secondary | ICD-10-CM | POA: Diagnosis present

## 2018-02-21 DIAGNOSIS — J189 Pneumonia, unspecified organism: Secondary | ICD-10-CM | POA: Diagnosis not present

## 2018-02-21 DIAGNOSIS — R0600 Dyspnea, unspecified: Secondary | ICD-10-CM

## 2018-02-21 DIAGNOSIS — Z515 Encounter for palliative care: Secondary | ICD-10-CM | POA: Diagnosis not present

## 2018-02-21 DIAGNOSIS — Z79891 Long term (current) use of opiate analgesic: Secondary | ICD-10-CM | POA: Diagnosis not present

## 2018-02-21 DIAGNOSIS — Z86711 Personal history of pulmonary embolism: Secondary | ICD-10-CM | POA: Diagnosis not present

## 2018-02-21 DIAGNOSIS — K219 Gastro-esophageal reflux disease without esophagitis: Secondary | ICD-10-CM | POA: Diagnosis present

## 2018-02-21 DIAGNOSIS — Z7189 Other specified counseling: Secondary | ICD-10-CM | POA: Diagnosis not present

## 2018-02-21 DIAGNOSIS — I509 Heart failure, unspecified: Secondary | ICD-10-CM | POA: Diagnosis not present

## 2018-02-21 DIAGNOSIS — J90 Pleural effusion, not elsewhere classified: Secondary | ICD-10-CM | POA: Diagnosis not present

## 2018-02-21 DIAGNOSIS — I959 Hypotension, unspecified: Secondary | ICD-10-CM | POA: Diagnosis not present

## 2018-02-21 DIAGNOSIS — Z791 Long term (current) use of non-steroidal anti-inflammatories (NSAID): Secondary | ICD-10-CM

## 2018-02-21 DIAGNOSIS — M2550 Pain in unspecified joint: Secondary | ICD-10-CM | POA: Diagnosis not present

## 2018-02-21 LAB — CBC WITH DIFFERENTIAL/PLATELET
Abs Immature Granulocytes: 0.14 10*3/uL — ABNORMAL HIGH (ref 0.00–0.07)
Basophils Absolute: 0 10*3/uL (ref 0.0–0.1)
Basophils Relative: 0 %
Eosinophils Absolute: 0.2 10*3/uL (ref 0.0–0.5)
Eosinophils Relative: 1 %
HEMATOCRIT: 33.3 % — AB (ref 36.0–46.0)
Hemoglobin: 10.5 g/dL — ABNORMAL LOW (ref 12.0–15.0)
Immature Granulocytes: 1 %
LYMPHS ABS: 1.5 10*3/uL (ref 0.7–4.0)
Lymphocytes Relative: 9 %
MCH: 31.4 pg (ref 26.0–34.0)
MCHC: 31.5 g/dL (ref 30.0–36.0)
MCV: 99.7 fL (ref 80.0–100.0)
Monocytes Absolute: 1.8 10*3/uL — ABNORMAL HIGH (ref 0.1–1.0)
Monocytes Relative: 11 %
Neutro Abs: 13.2 10*3/uL — ABNORMAL HIGH (ref 1.7–7.7)
Neutrophils Relative %: 78 %
Platelets: 445 10*3/uL — ABNORMAL HIGH (ref 150–400)
RBC: 3.34 MIL/uL — ABNORMAL LOW (ref 3.87–5.11)
RDW: 15.2 % (ref 11.5–15.5)
WBC: 16.8 10*3/uL — ABNORMAL HIGH (ref 4.0–10.5)
nRBC: 0 % (ref 0.0–0.2)

## 2018-02-21 LAB — PROTIME-INR
INR: 2.34
Prothrombin Time: 25.3 seconds — ABNORMAL HIGH (ref 11.4–15.2)

## 2018-02-21 LAB — COMPREHENSIVE METABOLIC PANEL
ALT: 9 U/L (ref 0–44)
AST: 12 U/L — ABNORMAL LOW (ref 15–41)
Albumin: 2.1 g/dL — ABNORMAL LOW (ref 3.5–5.0)
Alkaline Phosphatase: 109 U/L (ref 38–126)
Anion gap: 12 (ref 5–15)
BILIRUBIN TOTAL: 0.8 mg/dL (ref 0.3–1.2)
BUN: 14 mg/dL (ref 8–23)
CO2: 33 mmol/L — ABNORMAL HIGH (ref 22–32)
Calcium: 9 mg/dL (ref 8.9–10.3)
Chloride: 92 mmol/L — ABNORMAL LOW (ref 98–111)
Creatinine, Ser: 0.92 mg/dL (ref 0.44–1.00)
GFR calc Af Amer: >60 mL/min (ref >60)
GFR calc non Af Amer: 57 mL/min — ABNORMAL LOW (ref >60)
Glucose, Bld: 106 mg/dL — ABNORMAL HIGH (ref 70–99)
Potassium: 3.5 mmol/L (ref 3.5–5.1)
Sodium: 137 mmol/L (ref 135–145)
TOTAL PROTEIN: 7.3 g/dL (ref 6.5–8.1)

## 2018-02-21 LAB — URINALYSIS, ROUTINE W REFLEX MICROSCOPIC
Bilirubin Urine: NEGATIVE
Glucose, UA: NEGATIVE mg/dL
Hgb urine dipstick: NEGATIVE
Ketones, ur: 5 mg/dL — AB
Leukocytes, UA: NEGATIVE
Nitrite: NEGATIVE
Protein, ur: NEGATIVE mg/dL
Specific Gravity, Urine: 1.01 (ref 1.005–1.030)
pH: 6 (ref 5.0–8.0)

## 2018-02-21 LAB — I-STAT CG4 LACTIC ACID, ED: Lactic Acid, Venous: 1.03 mmol/L (ref 0.5–1.9)

## 2018-02-21 LAB — INFLUENZA PANEL BY PCR (TYPE A & B)
INFLAPCR: NEGATIVE
Influenza B By PCR: NEGATIVE

## 2018-02-21 LAB — BRAIN NATRIURETIC PEPTIDE: B Natriuretic Peptide: 44.6 pg/mL (ref 0.0–100.0)

## 2018-02-21 MED ORDER — DICLOFENAC SODIUM 1 % TD GEL
2.0000 g | Freq: Two times a day (BID) | TRANSDERMAL | Status: DC
  Administered 2018-02-22 – 2018-02-27 (×12): 2 g via TOPICAL
  Filled 2018-02-21: qty 100

## 2018-02-21 MED ORDER — TORSEMIDE 20 MG PO TABS
60.0000 mg | ORAL_TABLET | Freq: Two times a day (BID) | ORAL | Status: DC
  Administered 2018-02-22 – 2018-02-27 (×10): 60 mg via ORAL
  Filled 2018-02-21 (×13): qty 3

## 2018-02-21 MED ORDER — FLUTICASONE PROPIONATE 50 MCG/ACT NA SUSP
2.0000 | Freq: Every day | NASAL | Status: DC
  Administered 2018-02-22 – 2018-02-26 (×6): 2 via NASAL
  Filled 2018-02-21: qty 16

## 2018-02-21 MED ORDER — HYDROCORTISONE 1 % EX CREA
TOPICAL_CREAM | Freq: Two times a day (BID) | CUTANEOUS | Status: DC
  Administered 2018-02-22 – 2018-02-24 (×7): via TOPICAL
  Filled 2018-02-21: qty 28

## 2018-02-21 MED ORDER — TEMAZEPAM 15 MG PO CAPS
30.0000 mg | ORAL_CAPSULE | Freq: Every day | ORAL | Status: DC
  Filled 2018-02-21: qty 2

## 2018-02-21 MED ORDER — SODIUM CHLORIDE 0.9 % IV SOLN
2.0000 g | Freq: Two times a day (BID) | INTRAVENOUS | Status: DC
  Administered 2018-02-22 – 2018-02-24 (×6): 2 g via INTRAVENOUS
  Filled 2018-02-21 (×7): qty 2

## 2018-02-21 MED ORDER — LORATADINE 10 MG PO TABS
10.0000 mg | ORAL_TABLET | Freq: Every day | ORAL | Status: DC
  Administered 2018-02-22 – 2018-02-27 (×6): 10 mg via ORAL
  Filled 2018-02-21 (×6): qty 1

## 2018-02-21 MED ORDER — ACETAMINOPHEN 325 MG PO TABS
325.0000 mg | ORAL_TABLET | Freq: Four times a day (QID) | ORAL | Status: DC | PRN
  Administered 2018-02-24 – 2018-02-26 (×3): 325 mg via ORAL
  Filled 2018-02-21 (×3): qty 1

## 2018-02-21 MED ORDER — POLYETHYLENE GLYCOL 3350 17 G PO PACK
17.0000 g | PACK | Freq: Two times a day (BID) | ORAL | Status: DC
  Administered 2018-02-22 – 2018-02-26 (×8): 17 g via ORAL
  Filled 2018-02-21 (×8): qty 1

## 2018-02-21 MED ORDER — VANCOMYCIN HCL IN DEXTROSE 1-5 GM/200ML-% IV SOLN
1000.0000 mg | Freq: Once | INTRAVENOUS | Status: AC
  Administered 2018-02-21: 1000 mg via INTRAVENOUS
  Filled 2018-02-21: qty 200

## 2018-02-21 MED ORDER — POTASSIUM CHLORIDE CRYS ER 10 MEQ PO TBCR
10.0000 meq | EXTENDED_RELEASE_TABLET | Freq: Every day | ORAL | Status: DC
  Administered 2018-02-22 – 2018-02-23 (×2): 10 meq via ORAL
  Filled 2018-02-21 (×2): qty 1

## 2018-02-21 MED ORDER — ONDANSETRON HCL 4 MG/2ML IJ SOLN: 4.0000 mg | Freq: Four times a day (QID) | INTRAMUSCULAR | Status: DC | PRN

## 2018-02-21 MED ORDER — GABAPENTIN 300 MG PO CAPS
300.0000 mg | ORAL_CAPSULE | Freq: Two times a day (BID) | ORAL | Status: DC
  Administered 2018-02-22: 300 mg via ORAL
  Filled 2018-02-21 (×2): qty 1

## 2018-02-21 MED ORDER — SODIUM CHLORIDE 0.9 % IV SOLN
2.0000 g | Freq: Once | INTRAVENOUS | Status: AC
  Administered 2018-02-21: 2 g via INTRAVENOUS
  Filled 2018-02-21: qty 2

## 2018-02-21 MED ORDER — OXYCODONE HCL 5 MG PO TABS
5.0000 mg | ORAL_TABLET | ORAL | Status: DC | PRN
  Administered 2018-02-24 – 2018-02-26 (×2): 5 mg via ORAL
  Filled 2018-02-21 (×2): qty 1

## 2018-02-21 MED ORDER — SODIUM CHLORIDE 0.9 % IV SOLN: INTRAVENOUS | Status: DC

## 2018-02-21 MED ORDER — RIVAROXABAN 20 MG PO TABS
20.0000 mg | ORAL_TABLET | Freq: Every day | ORAL | Status: DC
  Administered 2018-02-22 – 2018-02-27 (×6): 20 mg via ORAL
  Filled 2018-02-21 (×6): qty 1

## 2018-02-21 MED ORDER — DULOXETINE HCL 60 MG PO CPEP
90.0000 mg | ORAL_CAPSULE | Freq: Every day | ORAL | Status: DC
  Administered 2018-02-22 – 2018-02-26 (×6): 90 mg via ORAL
  Filled 2018-02-21 (×6): qty 1

## 2018-02-21 MED ORDER — METHOCARBAMOL 500 MG PO TABS
750.0000 mg | ORAL_TABLET | Freq: Two times a day (BID) | ORAL | Status: DC
  Administered 2018-02-22 – 2018-02-27 (×11): 750 mg via ORAL
  Filled 2018-02-21 (×12): qty 2

## 2018-02-21 MED ORDER — BISACODYL 10 MG RE SUPP: 10.0000 mg | Freq: Every day | RECTAL | Status: DC | PRN

## 2018-02-21 MED ORDER — ONDANSETRON HCL 4 MG PO TABS: 4.0000 mg | ORAL_TABLET | Freq: Four times a day (QID) | ORAL | Status: DC | PRN

## 2018-02-21 MED ORDER — DIVALPROEX SODIUM 125 MG PO CSDR
500.0000 mg | DELAYED_RELEASE_CAPSULE | Freq: Every day | ORAL | Status: DC
  Administered 2018-02-22 – 2018-02-26 (×6): 500 mg via ORAL
  Filled 2018-02-21 (×6): qty 4

## 2018-02-21 MED ORDER — SODIUM CHLORIDE 0.9 % IV BOLUS (SEPSIS): 1000.0000 mL | Freq: Once | INTRAVENOUS | Status: DC

## 2018-02-21 MED ORDER — MELATONIN 3 MG PO TABS
6.0000 mg | ORAL_TABLET | Freq: Every day | ORAL | Status: DC
  Filled 2018-02-21: qty 2

## 2018-02-21 MED ORDER — TIMOLOL MALEATE 0.5 % OP SOLN
1.0000 [drp] | Freq: Two times a day (BID) | OPHTHALMIC | Status: DC
  Administered 2018-02-22 – 2018-02-27 (×12): 1 [drp] via OPHTHALMIC
  Filled 2018-02-21 (×2): qty 5

## 2018-02-21 MED ORDER — MAGNESIUM HYDROXIDE 400 MG/5ML PO SUSP
60.0000 mL | ORAL | Status: DC
  Administered 2018-02-22 – 2018-02-26 (×2): 60 mL via ORAL
  Filled 2018-02-21 (×4): qty 60

## 2018-02-21 MED ORDER — SODIUM CHLORIDE 0.9 % IV BOLUS (SEPSIS)
1000.0000 mL | Freq: Once | INTRAVENOUS | Status: AC
  Administered 2018-02-21: 1000 mL via INTRAVENOUS

## 2018-02-21 MED ORDER — ADULT MULTIVITAMIN W/MINERALS CH
1.0000 | ORAL_TABLET | Freq: Every day | ORAL | Status: DC
  Administered 2018-02-22 – 2018-02-27 (×6): 1 via ORAL
  Filled 2018-02-21 (×6): qty 1

## 2018-02-21 MED ORDER — METHADONE HCL 5 MG PO TABS
5.0000 mg | ORAL_TABLET | Freq: Two times a day (BID) | ORAL | Status: DC
  Administered 2018-02-22 (×2): 5 mg via ORAL
  Filled 2018-02-21 (×2): qty 1

## 2018-02-21 MED ORDER — METRONIDAZOLE IN NACL 5-0.79 MG/ML-% IV SOLN
500.0000 mg | Freq: Three times a day (TID) | INTRAVENOUS | Status: DC
  Administered 2018-02-21 – 2018-02-22 (×3): 500 mg via INTRAVENOUS
  Filled 2018-02-21 (×3): qty 100

## 2018-02-21 MED ORDER — SODIUM CHLORIDE 0.9 % IV SOLN
INTRAVENOUS | Status: DC | PRN
  Administered 2018-02-21: 1000 mL via INTRAVENOUS

## 2018-02-21 NOTE — ED Notes (Signed)
Bed: WA19 Expected date:  Expected time:  Means of arrival:  Comments: EMS- AMS 

## 2018-02-21 NOTE — Progress Notes (Signed)
A consult was received from an ED physician for Cefepime and Vancomycin for sepsis of unknown source per pharmacy dosing.  The patient's profile has been reviewed for ht/wt/allergies/indication/available labs.   Labs are pending; Ht/Wt are pending  A one time order has been placed for Cefepime 2g IV and Vancomycin 1g IV.  Further antibiotics/pharmacy consults should be ordered by admitting physician if indicated.                       Thank you,  Gretta Arab PharmD, BCPS Pager [phone] 02/21/2018 4:49 PM

## 2018-02-21 NOTE — H&P (Signed)
History and Physical   Monique [facility] BDZ:[id number] DOB: [birth year] DOA: 02/21/2018  Referring MD/NP/PA: Dr. Laverta Baltimore  PCP: Monique Pretty, MD   Outpatient Specialists: None  Patient coming from: Skilled nursing facility  Chief Complaint: Fatigue and fever with altered mental status  HPI: Monique [facility]so is a 84 y.o. female with medical history significant of chronic pain syndrome, history of PE on Xarelto, pulmonary hypertension, prior pneumonia, history of perforated gastric ulcer and diastolic dysfunction CHF who was brought in by daughter who went to visit her at the nursing facility today and found her confused and altered.  Patient is currently not communicating at all.  She was worried about the condition of the patient and brought her to the ER.  In the ER patient was evaluated and found to have sepsis type numbers with possible pneumonia.  Urinalysis was normal chest x-ray showing pulmonary edema and possible atelectasis.  She has fever with lactic acidosis and leukocytosis.  She is being admitted therefore with altered mental status most likely secondary to pneumonia and sepsis..  ED Course: Temperature is 100.5 blood pressure is 91/23 pulse 97 respiratory of 20 oxygen sat 92% room air.  White count is 16.8 with hemoglobin 10.5 and platelets 445.  Sodium 137 but chloride 92 CO2 33 BUN 14 creatinine 0.92.  Urinalysis essentially negative blood cultures have been obtained.  Lactic acid 1.03.  Flu assay is negative.  Chest x-ray showed borderline mild pulmonary edema and bilateral pleural effusions and patchy lower lobe opacities.  Review of Systems: As per HPI otherwise 10 point review of systems negative.    Past Medical History:  Diagnosis Date  . Anemia   . Arthritis   . CHF (congestive heart failure) ([facility])   . Chronic bilateral low back pain without sciatica   . Depression   . GERD (gastroesophageal reflux disease)   . Hyponatremia 01/2016  . Insomnia 05/20/2016  . Perforated  chronic gastric ulcer ([facility]) 08/11/2014  . Pneumonia   . Pulmonary arterial hypertension ([facility]) 11/28/2014    Past Surgical History:  Procedure Laterality Date  . ABDOMINAL HYSTERECTOMY    . ABDOMINAL SURGERY    . BACK SURGERY  2000  . COLON SURGERY     x2-blockage  . COLONOSCOPY    . ERCP    . HERNIA REPAIR  2012   x2 with mesh  . JOINT REPLACEMENT Left    shoulder  . LAPAROTOMY N/A 08/04/2014   Procedure: EXPLORATORY LAPAROTOMY, REPAIR OF PERFORATED ULCER;  Surgeon: Excell Seltzer, MD;  Location: [facility];  Service: General;  Laterality: N/A;  . LAPAROTOMY N/A 12/21/2015   Procedure: EXPLORATORY LAPAROTOMY/ REMOVAL OF MESH;  Surgeon: Coralie Keens, MD;  Location: [facility];  Service: General;  Laterality: N/A;  . ORIF WRIST FRACTURE  2012   left  . REVERSE SHOULDER ARTHROPLASTY Right 12/04/2013  . REVERSE SHOULDER ARTHROPLASTY  12/04/2013   Procedure: REVERSE SHOULDER ARTHROPLASTY;  Surgeon: Nita Sells, MD;  Location: [facility];  Service: Orthopedics;;  Right reverse total shoulder arthroplasty  . ROTATOR CUFF REPAIR     dr Tamera Punt  . SHOULDER ARTHROSCOPY  2012   lt and rt  . TENDON REPAIR Right 04/13/2014   Procedure: RIGHT HAND CENTRAL TENDON CENTRALIZATION OF LONG AND RING FINGERS;  Surgeon: Jolyn Nap, MD;  Location: [facility];  Service: Orthopedics;  Laterality: Right;     reports that she has never smoked. She has never used smokeless tobacco. She reports that she  drank about 7.0 standard drinks of alcohol per week. She reports that she does not use drugs.  Allergies  Allergen Reactions  . Tape Other (See Comments)    SKIN IS VERY THIN; IT BRUISES AND TEARS EASILY!! -THX    Family History  Problem Relation Age of Onset  . Stroke Mother   . Breast cancer Sister   . Ulcerative colitis Daughter   . Chronic fatigue Daughter   . Chronic fatigue Daughter      Prior to Admission medications   Medication Sig Start Date End Date Taking?  Authorizing Provider  acetaminophen (TYLENOL) 325 MG tablet Take 325 mg by mouth every 6 (six) hours as needed for mild pain.  07/10/13  Yes Dellinger, Bobby Rumpf, PA-C  desonide (DESOWEN) 0.05 % cream Apply 1 application topically 2 (two) times daily.   Yes [provider]  diclofenac sodium (VOLTAREN) 1 % GEL Apply 1 g topically 2 (two) times daily. Apply to bilateral knees for pain   Yes [provider]  divalproex (DEPAKOTE SPRINKLE) 125 MG capsule Give 4 Capsules (500 mg) by mouth at bedtime   Yes [provider]  DULoxetine HCl (CYMBALTA PO) Take 90 mg by mouth at bedtime.   Yes [provider]  fluticasone (FLONASE) 50 MCG/ACT nasal spray Place 2 sprays into both nostrils at bedtime.    Yes [provider]  gabapentin (NEURONTIN) 300 MG capsule Take 300 mg by mouth 2 (two) times daily.   Yes [provider]  loratadine (CLARITIN) 10 MG tablet Take 10 mg by mouth daily. 01/20/17  Yes [provider]  Melatonin 3 MG TABS Take 6 mg by mouth at bedtime.  09/20/17  Yes [provider]  methadone (DOLOPHINE) 5 MG tablet Take 1 tablet (5 mg total) by mouth every 12 (twelve) hours. 12/24/17  Yes [facility], [name], DO  methocarbamol (ROBAXIN) 750 MG tablet Take 750 mg by mouth every 12 (twelve) hours.  08/21/17  Yes [provider]  Multiple Vitamin (MULTIVITAMIN) tablet Take 1 tablet by mouth 3 (three) times daily.    Yes [provider]  oxyCODONE (ROXICODONE) 5 MG immediate release tablet Take 1 tablet (5 mg total) by mouth every 4 (four) hours as needed for severe pain. 10/30/17  Yes [name], NP  polyethylene glycol (MIRALAX / GLYCOLAX) packet Take 17 g by mouth 2 (two) times daily.    Yes [provider]  potassium chloride (K-DUR) 10 MEQ tablet Take 10 mEq by mouth daily.   Yes [provider]  rivaroxaban (XARELTO) 20 MG TABS tablet Take 20 mg by mouth daily.    Yes [provider]    temazepam (RESTORIL) 30 MG capsule Take 1 capsule (30 mg total) by mouth at bedtime. 01/14/18  Yes Monique [facility], [name], DO  timolol (BETIMOL) 0.5 % ophthalmic solution Place 1 drop into both eyes 2 (two) times daily.    Yes [provider]  torsemide (DEMADEX) 20 MG tablet Take 60 mg by mouth 2 (two) times daily.   Yes [provider]  bisacodyl (DULCOLAX) 10 MG suppository Place 1 suppository (10 mg total) rectally daily as needed for moderate constipation (May repeat times one). 07/10/13   Dellinger, Bobby Rumpf, PA-C  magnesium hydroxide (MILK OF MAGNESIA) 400 MG/5ML suspension Take 60 mLs by mouth every other day. AS NEEDED FOR CONSTIPATION    [provider]    Physical Exam: Vitals:   02/21/18 1930 02/21/18 2008 02/21/18 2042 02/21/18 2129  BP: 103/62 115/74 94/64 130/67  Pulse: 93 87 89 97  Resp: 12 14 11 20   Temp:    98.9 F (37.2 C)  TempSrc:    Oral  SpO2: 95% 100% 100% 98%  Weight:      Height:          Constitutional: calm, comfortable, obtunded Vitals:   02/21/18 1930 02/21/18 2008 02/21/18 2042 02/21/18 2129  BP: 103/62 115/74 94/64 130/67  Pulse: 93 87 89 97  Resp: 12 14 11 20   Temp:    98.9 F (37.2 C)  TempSrc:    Oral  SpO2: 95% 100% 100% 98%  Weight:      Height:       Eyes: PERRL, lids and conjunctivae normal ENMT: Mucous membranes are moist. Posterior pharynx clear of any exudate or lesions.Normal dentition.  Neck: normal, supple, no masses, no thyromegaly Respiratory: Decreased air entry to auscultation bilaterally, no wheezing, mild crackles. Normal respiratory effort. No accessory muscle use.  Cardiovascular: Regular rate and rhythm, no murmurs / rubs / gallops. No extremity edema. 2+ pedal pulses. No carotid bruits.  Abdomen: no tenderness, no masses palpated. No hepatosplenomegaly. Bowel sounds positive.  Musculoskeletal: no clubbing / cyanosis. No joint deformity upper and lower extremities. Good ROM, no contractures. Normal  muscle tone.  Skin: no rashes, lesions, ulcers. No induration Neurologic: CN 2-12 grossly intact. Sensation intact, DTR normal. Strength 5/5 in all 4.  Psychiatric: Patient is obtunded currently not communicating.    Labs on Admission: I have personally reviewed following labs and imaging studies  CBC: Recent Labs  Lab 02/21/18 1655  WBC 16.8*  NEUTROABS 13.2*  HGB 10.5*  HCT 33.3*  MCV 99.7  PLT 191*   Basic Metabolic Panel: Recent Labs  Lab 02/21/18 1655  NA 137  K 3.5  CL 92*  CO2 33*  GLUCOSE 106*  BUN 14  CREATININE 0.92  CALCIUM 9.0   GFR: Estimated Creatinine Clearance: 49.8 mL/min (by C-G formula based on SCr of 0.92 mg/dL). Liver Function Tests: Recent Labs  Lab 02/21/18 1655  AST 12*  ALT 9  ALKPHOS 109  BILITOT 0.8  PROT 7.3  ALBUMIN 2.1*   No results for input(s): LIPASE, AMYLASE in the last 168 hours. No results for input(s): AMMONIA in the last 168 hours. Coagulation Profile: Recent Labs  Lab 02/21/18 1655  INR 2.34   Cardiac Enzymes: No results for input(s): CKTOTAL, CKMB, CKMBINDEX, TROPONINI in the last 168 hours. BNP (last 3 results) No results for input(s): PROBNP in the last 8760 hours. HbA1C: No results for input(s): HGBA1C in the last 72 hours. CBG: No results for input(s): GLUCAP in the last 168 hours. Lipid Profile: No results for input(s): CHOL, HDL, LDLCALC, TRIG, CHOLHDL, LDLDIRECT in the last 72 hours. Thyroid Function Tests: No results for input(s): TSH, T4TOTAL, FREET4, T3FREE, THYROIDAB in the last 72 hours. Anemia Panel: No results for input(s): VITAMINB12, FOLATE, FERRITIN, TIBC, IRON, RETICCTPCT in the last 72 hours. Urine analysis:    Component Value Date/Time   COLORURINE YELLOW 02/21/2018 1656   APPEARANCEUR CLEAR 02/21/2018 1656   LABSPEC 1.010 02/21/2018 1656   PHURINE 6.0 02/21/2018 1656   GLUCOSEU NEGATIVE 02/21/2018 1656   HGBUR NEGATIVE 02/21/2018 1656   BILIRUBINUR NEGATIVE 02/21/2018 1656    KETONESUR 5 (A) 02/21/2018 1656   PROTEINUR NEGATIVE 02/21/2018 1656   UROBILINOGEN 0.2 08/04/2014 2100   NITRITE NEGATIVE 02/21/2018 1656   LEUKOCYTESUR NEGATIVE 02/21/2018 1656   Sepsis Labs: @LABRCNTIP (procalcitonin:4,lacticidven:4) )No results  found for this or any previous visit (from the past 240 hour(s)).   Radiological Exams on Admission: Dg Chest 2 View  Result Date: 02/21/2018 CLINICAL DATA:  Hypoxia EXAM: CHEST - 2 VIEW COMPARISON:  01/20/2016 chest radiograph. FINDINGS: Bilateral total shoulder arthroplasty. Stable cardiomediastinal silhouette with mild cardiomegaly. No pneumothorax. Small bilateral pleural effusions with patchy bilateral lower lobe lung opacities. Borderline mild pulmonary edema. IMPRESSION: 1. Borderline mild congestive heart failure with small bilateral pleural effusions. 2. Nonspecific patchy bilateral lower lobe lung opacities, favor atelectasis. Electronically Signed   By: Ilona Sorrel M.D.   On: 02/21/2018 17:15    EKG: Independently reviewed.  It shows sinus rhythm with a rate of 91, low voltage EKG.  Assessment/Plan Principal Problem:   Sepsis, Gram negative (HCC) Active Problems:   Chronic pain syndrome   GERD (gastroesophageal reflux disease)   Depression with anxiety   Pneumonia     #1 sepsis: Most likely secondary to pneumonia.  Patient will be admitted and initiated on IV antibiotics for healthcare associated pneumonia.  Will monitor closely get blood cultures.  May repeat chest x-ray in the morning to see if that improves.  #2 altered mental status: Could be due to pneumonia.  Patient also has chronic narcotic use.  She may have altered mental status from the use of medications.  Will watch overnight if not better.  #3 chronic pain syndrome: Again continue with home regiment.    #4 GERD: Continue with PPIs  #5 history of DVT and PE: Continue with anticoagulation.  #6 leukocytosis: Most likely secondary to sepsis.  Continue to monitor  white count.    DVT prophylaxis: Xarelto Code Status: DNR Family Communication: Daughter at bedside Disposition Plan: Back to skilled facility Consults called: None Admission status: Inpatient  Severity of Illness: The appropriate patient status for this patient is INPATIENT. Inpatient status is judged to be reasonable and necessary in order to provide the required intensity of service to ensure the patient's safety. The patient's presenting symptoms, physical exam findings, and initial radiographic and laboratory data in the context of their chronic comorbidities is felt to place them at high risk for further clinical deterioration. Furthermore, it is not anticipated that the patient will be medically stable for discharge from the [facility] within 2 midnights of admission. The following factors support the patient status of inpatient.   " The patient's presenting symptoms include altered mental status. " The worrisome physical exam findings include confusion. " The initial radiographic and laboratory data are worrisome because of leukocytosis, fever as well as x-ray findings. " The chronic co-morbidities include chronic pain syndrome with seizure disorder.   * I certify that at the point of admission it is my clinical judgment that the patient will require inpatient [facility] care spanning beyond 2 midnights from the point of admission due to high intensity of service, high risk for further deterioration and high frequency of surveillance required.Barbette Merino MD [facility] Pager [phone]  If 7PM-7AM, please contact night-coverage [email or web address] Password [email or web address]  02/21/2018, 10:14 PM

## 2018-02-21 NOTE — ED Notes (Signed)
ED TO INPATIENT HANDOFF REPORT  Name/Age/Gender Monique [facility]so 84 y.o. female  Code Status Code Status History    Date Active Date Inactive Code Status Order ID Comments User Context   01/20/2016 2057 01/24/2016 2314 DNR [id number]  Edwin Dada, MD Inpatient   12/30/2015 1036 01/06/2016 1857 DNR [id number]  Earlie Counts, NP Inpatient   12/30/2015 1029 12/30/2015 1036 DNR [id number]  Earlie Counts, NP Inpatient   12/18/2015 1323 12/30/2015 1029 Full Code [id number]  Edwin Dada, MD Inpatient   08/04/2014 1948 08/12/2014 1547 Full Code [id number]  Excell Seltzer, MD Inpatient   07/17/2014 2102 07/20/2014 1358 DNR [id number]  Toy Baker, MD Inpatient   12/04/2013 1515 12/09/2013 1821 Full Code [id number]  Grier Mitts, PA-C Inpatient   07/04/2013 2342 07/10/2013 1917 DNR [id number]  Juluis Mire, MD Inpatient   07/04/2013 0103 07/04/2013 2342 Full Code [id number]  Orvan Falconer, MD Inpatient    Questions for Most Recent Historical Code Status (Order [id number])    Question Answer Comment   In the event of cardiac or respiratory ARREST Do not call a "code blue"    In the event of cardiac or respiratory ARREST Do not perform Intubation, CPR, defibrillation or ACLS    In the event of cardiac or respiratory ARREST Use medication by any route, position, wound care, and other measures to relive pain and suffering. May use oxygen, suction and manual treatment of airway obstruction as needed for comfort.         Advance Directive Documentation     Most Recent Value  Type of Advance Directive  Out of facility DNR (pink MOST or yellow form)  Pre-existing out of facility DNR order (yellow form or pink MOST form)  -  "MOST" Form in Place?  -      Home/SNF/Other Skilled nursing facility  Chief Complaint ams  Level of Care/Admitting Diagnosis ED Disposition    ED Disposition Condition [place]: [place] [100102]  Level of  Care: Telemetry [5]  Admit to tele based on following criteria: Other see comments  Comments: sepsis  Diagnosis: Sepsis, Gram negative ([place]) [563149]  Admitting Physician: Elwyn Reach [[id number]]  Attending Physician: Elwyn Reach [[id number]]  Estimated length of stay: past midnight tomorrow  Certification:: I certify this patient will need inpatient services for at least 2 midnights  PT Class (Do Not Modify): Inpatient [101]  PT Acc Code (Do Not Modify): Private [1]       Medical History Past Medical History:  Diagnosis Date  . Anemia   . Arthritis   . CHF (congestive heart failure) ([place])   . Chronic bilateral low back pain without sciatica   . Depression   . GERD (gastroesophageal reflux disease)   . Hyponatremia 01/2016  . Insomnia 05/20/2016  . Perforated chronic gastric ulcer ([place]) 08/11/2014  . Pneumonia   . Pulmonary arterial hypertension ([place]) 11/28/2014    Allergies Allergies  Allergen Reactions  . Tape Other (See Comments)    SKIN IS VERY THIN; IT BRUISES AND TEARS EASILY!! -THX    IV Location/Drains/Wounds Patient Lines/Drains/Airways Status   Active Line/Drains/Airways    Name:   Placement date:   Placement time:   Site:   Days:   Peripheral IV 02/21/18 Left Antecubital   02/21/18    -    Antecubital   less than 1   Peripheral IV 02/21/18 Right Antecubital   02/21/18    1726  Antecubital   less than 1   Wound / Incision (Open or Dehisced) 01/21/16 Incision - Open Abdomen   01/21/16    1100    Abdomen   762          Labs/Imaging Results for orders placed or performed during the hospital encounter of 02/21/18 (from the past 48 hour(s))  Comprehensive metabolic panel     Status: Abnormal   Collection Time: 02/21/18  4:55 PM  Result Value Ref Range   Sodium 137 135 - 145 mmol/L   Potassium 3.5 3.5 - 5.1 mmol/L   Chloride 92 (L) 98 - 111 mmol/L   CO2 33 (H) 22 - 32 mmol/L   Glucose, Bld 106 (H) 70 - 99 mg/dL   BUN 14 8 - 23 mg/dL   Creatinine, Ser  0.92 0.44 - 1.00 mg/dL   Calcium 9.0 8.9 - 10.3 mg/dL   Total Protein 7.3 6.5 - 8.1 g/dL   Albumin 2.1 (L) 3.5 - 5.0 g/dL   AST 12 (L) 15 - 41 U/L   ALT 9 0 - 44 U/L   Alkaline Phosphatase 109 38 - 126 U/L   Total Bilirubin 0.8 0.3 - 1.2 mg/dL   GFR calc non Af Amer 57 (L) >60 mL/min   GFR calc Af Amer >60 >60 mL/min   Anion gap 12 5 - 15    Comment: Performed at [facility], [place] [address], [place], [place] [address]  CBC with Differential     Status: Abnormal   Collection Time: 02/21/18  4:55 PM  Result Value Ref Range   WBC 16.8 (H) 4.0 - 10.5 K/uL   RBC 3.34 (L) 3.87 - 5.11 MIL/uL   Hemoglobin 10.5 (L) 12.0 - 15.0 g/dL   HCT 33.3 (L) 36.0 - 46.0 %   MCV 99.7 80.0 - 100.0 fL   MCH 31.4 26.0 - 34.0 pg   MCHC 31.5 30.0 - 36.0 g/dL   RDW 15.2 11.5 - 15.5 %   Platelets 445 (H) 150 - 400 K/uL   nRBC 0.0 0.0 - 0.2 %   Neutrophils Relative % 78 %   Neutro Abs 13.2 (H) 1.7 - 7.7 K/uL   Lymphocytes Relative 9 %   Lymphs Abs 1.5 0.7 - 4.0 K/uL   Monocytes Relative 11 %   Monocytes Absolute 1.8 (H) 0.1 - 1.0 K/uL   Eosinophils Relative 1 %   Eosinophils Absolute 0.2 0.0 - 0.5 K/uL   Basophils Relative 0 %   Basophils Absolute 0.0 0.0 - 0.1 K/uL   Immature Granulocytes 1 %   Abs Immature Granulocytes 0.14 (H) 0.00 - 0.07 K/uL    Comment: Performed at [facility], [place] [address], [place], [place] [address]  Protime-INR     Status: Abnormal   Collection Time: 02/21/18  4:55 PM  Result Value Ref Range   Prothrombin Time 25.3 (H) 11.4 - 15.2 seconds   INR 2.34     Comment: Performed at [facility], [place] [address], [place],  [address]  Urinalysis, Routine w reflex microscopic     Status: Abnormal   Collection Time: 02/21/18  4:56 PM  Result Value Ref Range   Color, Urine YELLOW YELLOW   APPearance CLEAR CLEAR   Specific Gravity, Urine 1.010 1.005 - 1.030   pH 6.0 5.0 - 8.0   Glucose, UA NEGATIVE NEGATIVE mg/dL    Hgb urine dipstick NEGATIVE NEGATIVE   Bilirubin Urine NEGATIVE NEGATIVE   Ketones,  ur 5 (A) NEGATIVE mg/dL   Protein, ur NEGATIVE NEGATIVE mg/dL   Nitrite NEGATIVE NEGATIVE   Leukocytes, UA NEGATIVE NEGATIVE    Comment: Performed at [facility], [place] [address], [place], [place] [address]  I-Stat CG4 Lactic Acid, ED     Status: None   Collection Time: 02/21/18  4:57 PM  Result Value Ref Range   Lactic Acid, Venous 1.03 0.5 - 1.9 mmol/L  Brain natriuretic peptide     Status: None   Collection Time: 02/21/18  4:57 PM  Result Value Ref Range   B Natriuretic Peptide 44.6 0.0 - 100.0 pg/mL    Comment: Performed at [facility], [place] [address], [place], [place] [address]  Influenza panel by PCR (type A & B)     Status: None   Collection Time: 02/21/18  5:58 PM  Result Value Ref Range   Influenza A By PCR NEGATIVE NEGATIVE   Influenza B By PCR NEGATIVE NEGATIVE    Comment: (NOTE) The Xpert Xpress Flu assay is intended as an aid in the diagnosis of  influenza and should not be used as a sole basis for treatment.  This  assay is FDA approved for nasopharyngeal swab specimens only. Nasal  washings and aspirates are unacceptable for Xpert Xpress Flu testing. Performed at [facility], [place] [address] [place], [place] [address]    Dg Chest 2 View  Result Date: 02/21/2018 CLINICAL DATA:  Hypoxia EXAM: CHEST - 2 VIEW COMPARISON:  01/20/2016 chest radiograph. FINDINGS: Bilateral total shoulder arthroplasty. Stable cardiomediastinal silhouette with mild cardiomegaly. No pneumothorax. Small bilateral pleural effusions with patchy bilateral lower lobe lung opacities. Borderline mild pulmonary edema. IMPRESSION: 1. Borderline mild congestive heart failure with small bilateral pleural effusions. 2. Nonspecific patchy bilateral lower lobe lung opacities, favor atelectasis. Electronically Signed   By: Ilona Sorrel M.D.   On: 02/21/2018 17:15     Pending Labs Unresulted Labs (From admission, onward)    Start     Ordered   02/21/18 1635  Culture, blood (Routine x 2)  BLOOD CULTURE X 2,   STAT     02/21/18 1634   Signed and Held  Comprehensive metabolic panel  Tomorrow morning,   R     Signed and Held   Signed and Held  CBC  Tomorrow morning,   R     Signed and Held          Vitals/Pain Today's Vitals   02/21/18 1910 02/21/18 1911 02/21/18 1917 02/21/18 1930  BP:   128/88 103/62  Pulse: 95 93 94 93  Resp: 11 13 10 12   Temp:      TempSrc:      SpO2: 100% 98% 98% 95%  Weight:      Height:        Isolation Precautions Droplet precaution  Medications Medications  metroNIDAZOLE (FLAGYL) IVPB 500 mg (0 mg Intravenous Stopped 02/21/18 1904)  vancomycin (VANCOCIN) IVPB 1000 mg/200 mL premix (1,000 mg Intravenous New Bag/Given 02/21/18 1903)  0.9 %  sodium chloride infusion (1,000 mLs Intravenous New Bag/Given 02/21/18 1727)  ceFEPIme (MAXIPIME) 2 g in sodium chloride 0.9 % 100 mL IVPB (0 g Intravenous Stopped 02/21/18 1756)  sodium chloride 0.9 % bolus 1,000 mL (0 mLs Intravenous Stopped 02/21/18 1905)    Mobility non-ambulatory

## 2018-02-21 NOTE — ED Triage Notes (Signed)
Pt BIB EMS from [facility]. Family called EMS when they went to visit her, stated she "wouldn't wake up." EMS pt was AOx3, agitated about being transported to ED.  SNF staff reported pt had mild fever of 100.8 this morning.  Pt non ambulatory.

## 2018-02-21 NOTE — ED Provider Notes (Addendum)
[facility] DEPT Provider Note   CSN: [id number] Arrival date & time: 02/21/18  1605     History   Chief Complaint Chief Complaint  Patient presents with  . Fever  . Fatigue    HPI Monique Russo is a 84 y.o. female.  She with past medical history of CHF, perforated gastric ulcer, pneumonia, pulmonary arterial hypertension, prior PE, anticoagulated on Xarelto, presents to the emergency department from skilled nursing facility with altered mental status.  She is unable to contribute to her history.  Reportedly, family members found the patient unresponsive at the nursing home today and called EMS.  Level 5 caveat applies secondary to altered mental status.  The history is provided by the EMS personnel. No language interpreter was used.    Past Medical History:  Diagnosis Date  . Anemia   . Arthritis   . CHF (congestive heart failure) ([place])   . Chronic bilateral low back pain without sciatica   . Depression   . GERD (gastroesophageal reflux disease)   . Hyponatremia 01/2016  . Insomnia 05/20/2016  . Perforated chronic gastric ulcer ([place]) 08/11/2014  . Pneumonia   . Pulmonary arterial hypertension ([place]) 11/28/2014    Patient Active Problem List   Diagnosis Date Noted  . Acute conjunctivitis, bilateral 01/15/2018  . Cellulitis of right thigh 09/15/2017  . Depression with anxiety 06/19/2017  . Hyperammonemia ([place]) 05/22/2017  . Acute bronchitis 04/09/2017  . Glaucoma 01/15/2017  . Seasonal allergic rhinitis 01/15/2017  . Deep vein thrombosis (DVT) of femoral vein of both lower extremities ([place]) 10/20/2016  . Chronic bilateral deep vein thrombosis (DVT) of femoral veins ([place]) 10/14/2016  . Surgical wound, non healing, subsequent encounter 05/29/2016  . Insomnia 05/20/2016  . Constipation due to opioid therapy 04/08/2016  . Chronic narcotic use 02/15/2016  . GERD (gastroesophageal reflux disease) 02/15/2016  . Hypotension   . Protein-calorie  malnutrition, severe ([place])   . Chronic bilateral low back pain without sciatica   . Pulmonary arterial hypertension ([place]) 11/28/2014  . Chronic pain syndrome   . CHF (congestive heart failure) ([place]) 07/03/2013    Past Surgical History:  Procedure Laterality Date  . ABDOMINAL HYSTERECTOMY    . ABDOMINAL SURGERY    . BACK SURGERY  2000  . COLON SURGERY     x2-blockage  . COLONOSCOPY    . ERCP    . HERNIA REPAIR  2012   x2 with mesh  . JOINT REPLACEMENT Left    shoulder  . LAPAROTOMY N/A 08/04/2014   Procedure: EXPLORATORY LAPAROTOMY, REPAIR OF PERFORATED ULCER;  Surgeon: Excell Seltzer, MD;  Location: [facility];  Service: General;  Laterality: N/A;  . LAPAROTOMY N/A 12/21/2015   Procedure: EXPLORATORY LAPAROTOMY/ REMOVAL OF MESH;  Surgeon: Coralie Keens, MD;  Location: [facility];  Service: General;  Laterality: N/A;  . ORIF WRIST FRACTURE  2012   left  . REVERSE SHOULDER ARTHROPLASTY Right 12/04/2013  . REVERSE SHOULDER ARTHROPLASTY  12/04/2013   Procedure: REVERSE SHOULDER ARTHROPLASTY;  Surgeon: Nita Sells, MD;  Location: [facility] OR;  Service: Orthopedics;;  Right reverse total shoulder arthroplasty  . ROTATOR CUFF REPAIR     dr Tamera Punt  . SHOULDER ARTHROSCOPY  2012   lt and rt  . TENDON REPAIR Right 04/13/2014   Procedure: RIGHT HAND CENTRAL TENDON CENTRALIZATION OF LONG AND RING FINGERS;  Surgeon: Jolyn Nap, MD;  Location: [facility];  Service: Orthopedics;  Laterality: Right;     OB History  None      Home Medications    Prior to Admission medications   Medication Sig Start Date End Date Taking? Authorizing Provider  acetaminophen (TYLENOL) 325 MG tablet Take 325 mg by mouth every 6 (six) hours as needed for mild pain.  07/10/13   Dellinger, Bobby Rumpf, PA-C  bisacodyl (DULCOLAX) 10 MG suppository Place 1 suppository (10 mg total) rectally daily as needed for moderate constipation (May repeat times one). 07/10/13   Dellinger, Bobby Rumpf,  PA-C  diclofenac sodium (VOLTAREN) 1 % GEL Apply 1 g topically 2 (two) times daily. Apply to bilateral knees for pain    [provider]  divalproex (DEPAKOTE SPRINKLE) 125 MG capsule Give 4 Capsules (500 mg) by mouth at bedtime    [provider]  DULOXETINE HCL PO Take 90 mg by mouth at bedtime.  09/11/17   [provider]  fluticasone (FLONASE) 50 MCG/ACT nasal spray Place 1 spray into both nostrils at bedtime.    [provider]  gabapentin (NEURONTIN) 300 MG capsule Take 300 mg by mouth 2 (two) times daily.    [provider]  ketotifen (ZADITOR) 0.025 % ophthalmic solution Place 1 drop into both eyes 2 (two) times daily as needed.    [provider]  loratadine (CLARITIN) 10 MG tablet Take 10 mg by mouth daily. 01/20/17   [provider]  magnesium hydroxide (MILK OF MAGNESIA) 400 MG/5ML suspension Take 60 mLs by mouth every other day. AS NEEDED FOR CONSTIPATION    [provider]  Melatonin 3 MG TABS Take 2 tablets by mouth at bedtime.  09/20/17   [provider]  methadone (DOLOPHINE) 5 MG tablet Take 1 tablet (5 mg total) by mouth every 12 (twelve) hours. 12/24/17   Gildardo Cranker, DO  methocarbamol (ROBAXIN) 750 MG tablet Take 750 mg by mouth every 12 (twelve) hours.  08/21/17   [provider]  Multiple Vitamin (MULTIVITAMIN) tablet Take 1 tablet by mouth 3 (three) times daily.     [provider]  Nutritional Supplements (NUTRITIONAL SUPPLEMENT PO) Regular Diet - Mechanical soft texture    [provider]  oxyCODONE (ROXICODONE) 5 MG immediate release tablet Take 1 tablet (5 mg total) by mouth every 4 (four) hours as needed for severe pain. 10/30/17   [name], NP  polyethylene glycol (MIRALAX / GLYCOLAX) packet Take 17 g by mouth 2 (two) times daily.     [provider]  pseudoephedrine (SUDAFED) 30 MG tablet Take 30 mg by mouth every 6 (six) hours as needed for  congestion.    [provider]  ranitidine (ZANTAC) 150 MG tablet Take 150 mg by mouth 2 (two) times daily. 06/12/17   [provider]  rivaroxaban (XARELTO) 20 MG TABS tablet Take 20 mg by mouth daily.     [provider]  temazepam (RESTORIL) 30 MG capsule Take 1 capsule (30 mg total) by mouth at bedtime. 01/14/18   Gildardo Cranker, DO  timolol (BETIMOL) 0.5 % ophthalmic solution Place 1 drop into both eyes 2 (two) times daily.     [provider]  torsemide (DEMADEX) 20 MG tablet Take 2 tablets by mouth once daily    [provider]    Family History Family History  Problem Relation Age of Onset  . Stroke Mother   . Breast cancer Sister   . Ulcerative colitis Daughter   . Chronic fatigue Daughter   . Chronic fatigue Daughter  Social History Social History   Tobacco Use  . Smoking status: Never Smoker  . Smokeless tobacco: Never Used  Substance Use Topics  . Alcohol use: Not Currently    Alcohol/week: 7.0 standard drinks    Types: 7 Glasses of wine per week    Comment: 8 oz -12 oz of wine every   . Drug use: No     Allergies   Tape   Review of Systems Review of Systems  All other systems reviewed and are negative.    Physical Exam Updated Vital Signs BP (!) 91/23 (BP Location: Left Arm)   Pulse 96   Temp (!) 100.5 F (38.1 C) (Rectal)   Resp 16   SpO2 96%   Physical Exam  Constitutional: She appears well-developed and well-nourished.  HENT:  Head: Normocephalic and atraumatic.  Eyes: Pupils are equal, round, and reactive to light. Conjunctivae and EOM are normal.  Neck: Normal range of motion. Neck supple.  Cardiovascular: Normal rate and regular rhythm. Exam reveals no gallop and no friction rub.  No murmur heard. Pulmonary/Chest: Effort normal and breath sounds normal. No respiratory distress. She has no wheezes. She has no rales. She exhibits no tenderness.  Abdominal: Soft. Bowel sounds are normal. She  exhibits no distension and no mass. There is no tenderness. There is no rebound and no guarding.  Musculoskeletal: Normal range of motion. She exhibits no edema or tenderness.  Neurological: She is alert.  Awake, but not oriented  Skin: Skin is warm and dry.  Mild ulcer to anterior abdominal wall, covered with bandage  Psychiatric: She has a normal mood and affect. Her behavior is normal. Judgment and thought content normal.  Nursing note and vitals reviewed.    ED Treatments / Results  Labs (all labs ordered are listed, but only abnormal results are displayed) Labs Reviewed  COMPREHENSIVE METABOLIC PANEL - Abnormal; Notable for the following components:      Result Value   Chloride 92 (*)    CO2 33 (*)    Glucose, Bld 106 (*)    Albumin 2.1 (*)    AST 12 (*)    GFR calc non Af Amer 57 (*)    All other components within normal limits  CBC WITH DIFFERENTIAL/PLATELET - Abnormal; Notable for the following components:   WBC 16.8 (*)    RBC 3.34 (*)    Hemoglobin 10.5 (*)    HCT 33.3 (*)    Platelets 445 (*)    Neutro Abs 13.2 (*)    Monocytes Absolute 1.8 (*)    Abs Immature Granulocytes 0.14 (*)    All other components within normal limits  PROTIME-INR - Abnormal; Notable for the following components:   Prothrombin Time 25.3 (*)    All other components within normal limits  URINALYSIS, ROUTINE W REFLEX MICROSCOPIC - Abnormal; Notable for the following components:   Ketones, ur 5 (*)    All other components within normal limits  CULTURE, BLOOD (ROUTINE X 2)  CULTURE, BLOOD (ROUTINE X 2)  BRAIN NATRIURETIC PEPTIDE  INFLUENZA PANEL BY PCR (TYPE A & B)  I-STAT CG4 LACTIC ACID, ED  I-STAT CG4 LACTIC ACID, ED    EKG EKG Interpretation  Date/Time:  Thursday February 21 2018 17:41:13 EST Ventricular Rate:  93 PR Interval:    QRS Duration: 94 QT Interval:  384 QTC Calculation: 478 R Axis:   11 Text Interpretation:  Sinus rhythm Low voltage, precordial leads No STEMI.   Confirmed by Nanda Quinton (  [id number]) on 02/21/2018 5:44:29 PM   Radiology Dg Chest 2 View  Result Date: 02/21/2018 CLINICAL DATA:  Hypoxia EXAM: CHEST - 2 VIEW COMPARISON:  01/20/2016 chest radiograph. FINDINGS: Bilateral total shoulder arthroplasty. Stable cardiomediastinal silhouette with mild cardiomegaly. No pneumothorax. Small bilateral pleural effusions with patchy bilateral lower lobe lung opacities. Borderline mild pulmonary edema. IMPRESSION: 1. Borderline mild congestive heart failure with small bilateral pleural effusions. 2. Nonspecific patchy bilateral lower lobe lung opacities, favor atelectasis. Electronically Signed   By: Ilona Sorrel M.D.   On: 02/21/2018 17:15    Procedures Procedures (including critical care time) CRITICAL CARE Performed by: Montine Circle  Mild hypotension, fever, new oxygen requirement, meets sirs criteria, multiple rechecks. Total critical care time: 37 minutes  Critical care time was exclusive of separately billable procedures and treating other patients.  Critical care was necessary to treat or prevent imminent or life-threatening deterioration.  Critical care was time spent personally by me on the following activities: development of treatment plan with patient and/or surrogate as well as nursing, discussions with consultants, evaluation of patient's response to treatment, examination of patient, obtaining history from patient or surrogate, ordering and performing treatments and interventions, ordering and review of laboratory studies, ordering and review of radiographic studies, pulse oximetry and re-evaluation of patient's condition.  Medications Ordered in ED Medications - No data to display   Initial Impression / Assessment and Plan / ED Course  I have reviewed the triage vital signs and the nursing notes.  Pertinent labs & imaging results that were available during my care of the patient were reviewed by me and considered in my medical  decision making (see chart for details).     Patient brought to emergency department from skilled nursing facility with altered mental status.  Meets sepsis criteria with rectal temp of 100.5, hypotension, requiring 3 L nasal cannula oxygen.  Will initiate sepsis protocol including broad-spectrum antibiotics and weight-based fluids given hypotension.  Patient did have good improvement in blood pressure after initial liter of fluid, additional 2 L were held given patient's CHF history and good response to the initial 1 L.  Patient does not have an elevated lactic acid level.  Chest x-ray shows some opacities, which are favored to be atelectasis, and may be mild CHF.  Urinalysis is largely unremarkable.  Patient does have a leukocytosis to 16.8.  Unclear etiology of the patient's altered mental status at this time.  No evidence of head injury or fall.  Appreciate Dr. Jonelle Sidle from the hospitalist service for admitting the patient.  Patient discussed with Dr. Laverta Baltimore, who agrees with plan.  Final Clinical Impressions(s) / ED Diagnoses   Final diagnoses:  Altered mental status, unspecified altered mental status type    ED Discharge Orders    None       Montine Circle, PA-C 02/21/18 1945    Montine Circle, PA-C 02/21/18 2002    Margette Fast, MD 02/22/18 [phone]

## 2018-02-21 NOTE — ED Notes (Signed)
300 mL urine emptied from bladder via in/out cath.

## 2018-02-22 ENCOUNTER — Other Ambulatory Visit: Payer: Self-pay

## 2018-02-22 ENCOUNTER — Other Ambulatory Visit (HOSPITAL_COMMUNITY): Payer: Self-pay

## 2018-02-22 ENCOUNTER — Encounter (HOSPITAL_COMMUNITY): Payer: Self-pay

## 2018-02-22 DIAGNOSIS — A419 Sepsis, unspecified organism: Secondary | ICD-10-CM

## 2018-02-22 DIAGNOSIS — D72829 Elevated white blood cell count, unspecified: Secondary | ICD-10-CM

## 2018-02-22 DIAGNOSIS — G894 Chronic pain syndrome: Secondary | ICD-10-CM

## 2018-02-22 DIAGNOSIS — J189 Pneumonia, unspecified organism: Secondary | ICD-10-CM

## 2018-02-22 LAB — COMPREHENSIVE METABOLIC PANEL
ALT: 7 U/L (ref 0–44)
AST: 10 U/L — ABNORMAL LOW (ref 15–41)
Albumin: 2 g/dL — ABNORMAL LOW (ref 3.5–5.0)
Alkaline Phosphatase: 115 U/L (ref 38–126)
Anion gap: 9 (ref 5–15)
BUN: 13 mg/dL (ref 8–23)
CO2: 31 mmol/L (ref 22–32)
CREATININE: 0.67 mg/dL (ref 0.44–1.00)
Calcium: 8.6 mg/dL — ABNORMAL LOW (ref 8.9–10.3)
Chloride: 98 mmol/L (ref 98–111)
GFR calc Af Amer: >60 mL/min (ref >60)
GFR calc non Af Amer: >60 mL/min (ref >60)
Glucose, Bld: 103 mg/dL — ABNORMAL HIGH (ref 70–99)
Potassium: 3.4 mmol/L — ABNORMAL LOW (ref 3.5–5.1)
Sodium: 138 mmol/L (ref 135–145)
Total Bilirubin: 0.8 mg/dL (ref 0.3–1.2)
Total Protein: 6.4 g/dL — ABNORMAL LOW (ref 6.5–8.1)

## 2018-02-22 LAB — CBC
HCT: 30.6 % — ABNORMAL LOW (ref 36.0–46.0)
Hemoglobin: 9.4 g/dL — ABNORMAL LOW (ref 12.0–15.0)
MCH: 30.8 pg (ref 26.0–34.0)
MCHC: 30.7 g/dL (ref 30.0–36.0)
MCV: 100.3 fL — ABNORMAL HIGH (ref 80.0–100.0)
NRBC: 0 % (ref 0.0–0.2)
Platelets: 379 10*3/uL (ref 150–400)
RBC: 3.05 MIL/uL — ABNORMAL LOW (ref 3.87–5.11)
RDW: 15.1 % (ref 11.5–15.5)
WBC: 13 10*3/uL — ABNORMAL HIGH (ref 4.0–10.5)

## 2018-02-22 LAB — MRSA PCR SCREENING: MRSA by PCR: NEGATIVE

## 2018-02-22 MED ORDER — VANCOMYCIN HCL IN DEXTROSE 1-5 GM/200ML-% IV SOLN
1000.0000 mg | INTRAVENOUS | Status: DC
  Administered 2018-02-22: 1000 mg via INTRAVENOUS
  Filled 2018-02-22: qty 200

## 2018-02-22 NOTE — CV Procedure (Signed)
2D echo attempted but nursing cleaning patient up; then I got called to ER. Will attempt later or on 02/23/18.

## 2018-02-22 NOTE — Progress Notes (Signed)
Patient ID: Monique Russo, female   DOB: [birth year], 84 y.o.   MRN: [id number]  PROGRESS NOTE    Monique Russo  YSA:[id number] DOB: 17-Jan-1931 DOA: 02/21/2018 PCP: [name], MD   Brief Narrative:  84 year old female with history of chronic pain syndrome, PE on Xarelto, pulmonary hypertension, prior pneumonia, perforated gastric ulcer and chronic diastolic CHF presented from skilled nursing facility with fatigue and fever with altered mental status.  Chest x-ray was showing pulmonary edema and bilateral pleural effusions and patchy lower lobe opacities.  She was started on IV fluids and antibiotics.  Assessment & Plan:   Principal Problem:   Sepsis, Gram negative ([place]) Active Problems:   Chronic pain syndrome   GERD (gastroesophageal reflux disease)   Depression with anxiety   Pneumonia   Sepsis probably from pneumonia -Continue antibiotics.  Discontinue IV fluids because of history of CHF.  Follow cultures.  Probable healthcare associated pneumonia with concern for gram-negative versus MRSA pneumonia -Currently on cefepime, vancomycin and Flagyl.  Discontinue Flagyl.  Follow cultures.  Urine Legionella and streptococcal antigen. -Rapid influenza negative -Oxygen supplementation as needed -SLP evaluation  Leukocytosis -Probably secondary to above.  Improving.  Monitor  Acute metabolic encephalopathy -Most likely from pneumonia in a patient with history of chronic pain syndrome and chronic narcotic use. -She is still very drowsy and only moans a little bit with painful stimulus.  Monitor mental status.  Fall precautions.  Will hold narcotics including methadone for now -Palliative care consultation for goals of care  Chronic diastolic heart failure -Strict input and output.  Daily weights.  Continue torsemide  Chronic pain syndrome -Hold narcotics for now  History of DVT and PE -continue Xarelto  Obesity -Outpatient follow-up  DVT prophylaxis: Xarelto Code  Status: DNR Family Communication: Spoke to sister at bedside Disposition Plan: Depends on clinical outcome  Consultants: Palliative care  Procedures: None  Antimicrobials: Cefepime, Flagyl and vancomycin from 02/21/2018 onwards   Subjective: Patient seen and examined at bedside.  She is drowsy, moans and painful stimulus, hardly wakes up or answers questions.  No worsening fever or shortness of breath reported.  Objective: Vitals:   02/21/18 2008 02/21/18 2042 02/21/18 2129 02/22/18 0449  BP: 115/74 94/64 130/67 (!) 112/58  Pulse: 87 89 97 86  Resp: 14 11 20 20   Temp:   98.9 F (37.2 C) 99.4 F (37.4 C)  TempSrc:   Oral Axillary  SpO2: 100% 100% 98% 97%  Weight:      Height:        Intake/Output Summary (Last 24 hours) at 02/22/2018 1154 Last data filed at 02/22/2018 1100 Gross per 24 hour  Intake 3047.44 ml  Output -  Net 3047.44 ml   Filed Weights   02/21/18 1700  Weight: 99.3 kg    Examination:  General exam: Elderly female lying in bed, very drowsy. Respiratory system: Bilateral decreased breath sounds at bases with scattered crackles and some wheezing Cardiovascular system: S1 & S2 heard, Rate controlled Gastrointestinal system: Abdomen is obese, nondistended, soft and nontender. Normal bowel sounds heard. Extremities: No cyanosis, clubbing; pitting edema present Central nervous system: Very drowsy, hardly wakes up on calling her name, moans to painful stimulus.  Moving extremities  skin: No rashes, lesions or ulcers Psychiatry: Could not be assessed because of mental status    Data Reviewed: I have personally reviewed following labs and imaging studies  CBC: Recent Labs  Lab 02/21/18 1655 02/22/18 0530  WBC 16.8* 13.0*  NEUTROABS 13.2*  --  HGB 10.5* 9.4*  HCT 33.3* 30.6*  MCV 99.7 100.3*  PLT 445* 619   Basic Metabolic Panel: Recent Labs  Lab 02/21/18 1655 02/22/18 0530  NA 137 138  K 3.5 3.4*  CL 92* 98  CO2 33* 31  GLUCOSE 106* 103*    BUN 14 13  CREATININE 0.92 0.67  CALCIUM 9.0 8.6*   GFR: Estimated Creatinine Clearance: 57.2 mL/min (by C-G formula based on SCr of 0.67 mg/dL). Liver Function Tests: Recent Labs  Lab 02/21/18 1655 02/22/18 0530  AST 12* 10*  ALT 9 7  ALKPHOS 109 115  BILITOT 0.8 0.8  PROT 7.3 6.4*  ALBUMIN 2.1* 2.0*   No results for input(s): LIPASE, AMYLASE in the last 168 hours. No results for input(s): AMMONIA in the last 168 hours. Coagulation Profile: Recent Labs  Lab 02/21/18 1655  INR 2.34   Cardiac Enzymes: No results for input(s): CKTOTAL, CKMB, CKMBINDEX, TROPONINI in the last 168 hours. BNP (last 3 results) No results for input(s): PROBNP in the last 8760 hours. HbA1C: No results for input(s): HGBA1C in the last 72 hours. CBG: No results for input(s): GLUCAP in the last 168 hours. Lipid Profile: No results for input(s): CHOL, HDL, LDLCALC, TRIG, CHOLHDL, LDLDIRECT in the last 72 hours. Thyroid Function Tests: No results for input(s): TSH, T4TOTAL, FREET4, T3FREE, THYROIDAB in the last 72 hours. Anemia Panel: No results for input(s): VITAMINB12, FOLATE, FERRITIN, TIBC, IRON, RETICCTPCT in the last 72 hours. Sepsis Labs: Recent Labs  Lab 02/21/18 1657  LATICACIDVEN 1.03    Recent Results (from the past 240 hour(s))  Culture, blood (Routine x 2)     Status: None (Preliminary result)   Collection Time: 02/21/18  4:56 PM  Result Value Ref Range Status   Specimen Description   Final    BLOOD RIGHT ANTECUBITAL Performed at [facility] [address], [place], [place] [address]    Special Requests   Final    BOTTLES DRAWN AEROBIC AND ANAEROBIC Blood Culture adequate volume Performed at [facility] [address] [place], [place] [address]    Culture   Final    NO GROWTH < 12 HOURS Performed at [facility] [address], [place], [place] [address]    Report Status PENDING  Incomplete  Culture, blood (Routine x 2)      Status: None (Preliminary result)   Collection Time: 02/21/18  4:57 PM  Result Value Ref Range Status   Specimen Description   Final    BLOOD LEFT ANTECUBITAL Performed at [facility] [address] [place], [place] [address]    Special Requests   Final    BOTTLES DRAWN AEROBIC AND ANAEROBIC Blood Culture results may not be optimal due to an excessive volume of blood received in culture bottles Performed at [facility] [address], [place], [place] [address]    Culture   Final    NO GROWTH < 12 HOURS Performed at [facility] [address] [place], [place] [address]    Report Status PENDING  Incomplete  MRSA PCR Screening     Status: None   Collection Time: 02/22/18  5:39 AM  Result Value Ref Range Status   MRSA by PCR NEGATIVE NEGATIVE Final    Comment:        The GeneXpert MRSA Assay (FDA approved for NASAL specimens only), is one component of a comprehensive MRSA colonization surveillance program. It is not  intended to diagnose MRSA infection nor to guide or monitor treatment for MRSA infections. Performed at [facility], [place] [address], [place], [place] [address]          Radiology Studies: Dg Chest 2 View  Result Date: 02/21/2018 CLINICAL DATA:  Hypoxia EXAM: CHEST - 2 VIEW COMPARISON:  01/20/2016 chest radiograph. FINDINGS: Bilateral total shoulder arthroplasty. Stable cardiomediastinal silhouette with mild cardiomegaly. No pneumothorax. Small bilateral pleural effusions with patchy bilateral lower lobe lung opacities. Borderline mild pulmonary edema. IMPRESSION: 1. Borderline mild congestive heart failure with small bilateral pleural effusions. 2. Nonspecific patchy bilateral lower lobe lung opacities, favor atelectasis. Electronically Signed   By: Ilona Sorrel M.D.   On: 02/21/2018 17:15        Scheduled Meds: . diclofenac sodium  2 g Topical BID  . divalproex  500 mg Oral QHS  .  DULoxetine  90 mg Oral QHS  . fluticasone  2 spray Each Nare QHS  . gabapentin  300 mg Oral BID  . hydrocortisone cream   Topical BID  . loratadine  10 mg Oral Daily  . magnesium hydroxide  60 mL Oral QODAY  . Melatonin  6 mg Oral QHS  . methadone  5 mg Oral Q12H  . methocarbamol  750 mg Oral BID  . multivitamin with minerals  1 tablet Oral Daily  . polyethylene glycol  17 g Oral BID  . potassium chloride  10 mEq Oral Daily  . rivaroxaban  20 mg Oral Daily  . temazepam  30 mg Oral QHS  . timolol  1 drop Both Eyes BID  . torsemide  60 mg Oral BID   Continuous Infusions: . sodium chloride Stopped (02/21/18 2046)  . ceFEPime (MAXIPIME) IV 2 g (02/22/18 0536)  . metronidazole 500 mg (02/22/18 0916)  . vancomycin       LOS: 1 day        Aline August, MD [facility] Pager [phone]  If 7PM-7AM, please contact night-coverage [email or web address] Password [email or web address] 02/22/2018, 11:54 AM

## 2018-02-22 NOTE — Progress Notes (Signed)
Pharmacy Antibiotic Note  Monique Russo is a 84 y.o. female admitted on 02/21/2018 with sepsis.  Pharmacy has been consulted for Vancomycin, cefepime and flagyl dosing.  Plan: Cefepime 2gm iv q12hr Vancomycin 1gm iv x1, then 1gm iv q36hr  Goal AUC = 400 - 500 for all indications, except meningitis (goal AUC > 500 and Cmin 15-20 mcg/mL)   Height: 5' 2.5" (158.8 cm) Weight: 218 lb 14.7 oz (99.3 kg) IBW/kg (Calculated) : 51.25  Temp (24hrs), Avg:99.5 F (37.5 C), Min:98.2 F (36.8 C), Max:100.5 F (38.1 C)  Recent Labs  Lab 02/21/18 1655 02/21/18 1657 02/22/18 0530  WBC 16.8*  --  13.0*  CREATININE 0.92  --   --   LATICACIDVEN  --  1.03  --     Estimated Creatinine Clearance: 49.8 mL/min (by C-G formula based on SCr of 0.92 mg/dL).    Allergies  Allergen Reactions  . Tape Other (See Comments)    SKIN IS VERY THIN; IT BRUISES AND TEARS EASILY!! -THX    Antimicrobials this admission: Vancomycin 02/21/2018 >> Cefepime 02/21/2018 >>  Flaygl 02/21/2018 >>    Dose adjustments this admission: -  Microbiology results: -  Thank you for allowing pharmacy to be a part of this patient's care.  Nani Skillern Crowford 02/22/2018 6:02 AM

## 2018-02-22 NOTE — Evaluation (Signed)
SLP Cancellation Note  Patient Details Name: Monique Russo MRN: [id number] DOB: [birth year]   Cancelled treatment:       Reason Eval/Treat Not Completed: Fatigue/lethargy limiting ability to participate  will continue efforts   Macario Golds 02/22/2018, 12:45 PM   Luanna Salk, [facility] SLP Acute Rehab Services Pager [phone] Office [phone]

## 2018-02-23 ENCOUNTER — Inpatient Hospital Stay (HOSPITAL_COMMUNITY): Payer: PPO

## 2018-02-23 DIAGNOSIS — E876 Hypokalemia: Secondary | ICD-10-CM

## 2018-02-23 DIAGNOSIS — I370 Nonrheumatic pulmonary valve stenosis: Secondary | ICD-10-CM

## 2018-02-23 DIAGNOSIS — R4182 Altered mental status, unspecified: Secondary | ICD-10-CM

## 2018-02-23 DIAGNOSIS — A4150 Gram-negative sepsis, unspecified: Principal | ICD-10-CM

## 2018-02-23 DIAGNOSIS — I361 Nonrheumatic tricuspid (valve) insufficiency: Secondary | ICD-10-CM

## 2018-02-23 DIAGNOSIS — I5032 Chronic diastolic (congestive) heart failure: Secondary | ICD-10-CM

## 2018-02-23 DIAGNOSIS — Z515 Encounter for palliative care: Secondary | ICD-10-CM

## 2018-02-23 DIAGNOSIS — Z7189 Other specified counseling: Secondary | ICD-10-CM

## 2018-02-23 DIAGNOSIS — G9341 Metabolic encephalopathy: Secondary | ICD-10-CM

## 2018-02-23 LAB — ECHOCARDIOGRAM COMPLETE
Height: 62.5 in
Weight: 3499.14 oz

## 2018-02-23 LAB — COMPREHENSIVE METABOLIC PANEL
ALT: 6 U/L (ref 0–44)
AST: 11 U/L — AB (ref 15–41)
Albumin: 1.9 g/dL — ABNORMAL LOW (ref 3.5–5.0)
Alkaline Phosphatase: 81 U/L (ref 38–126)
Anion gap: 11 (ref 5–15)
BUN: 13 mg/dL (ref 8–23)
CHLORIDE: 97 mmol/L — AB (ref 98–111)
CO2: 34 mmol/L — ABNORMAL HIGH (ref 22–32)
Calcium: 9.1 mg/dL (ref 8.9–10.3)
Creatinine, Ser: 0.83 mg/dL (ref 0.44–1.00)
GFR calc Af Amer: >60 mL/min (ref >60)
GFR calc non Af Amer: >60 mL/min (ref >60)
Glucose, Bld: 94 mg/dL (ref 70–99)
POTASSIUM: 3.2 mmol/L — AB (ref 3.5–5.1)
Sodium: 142 mmol/L (ref 135–145)
Total Bilirubin: 0.7 mg/dL (ref 0.3–1.2)
Total Protein: 6.5 g/dL (ref 6.5–8.1)

## 2018-02-23 LAB — CBC WITH DIFFERENTIAL/PLATELET
Abs Immature Granulocytes: 0.09 10*3/uL — ABNORMAL HIGH (ref 0.00–0.07)
Basophils Absolute: 0 10*3/uL (ref 0.0–0.1)
Basophils Relative: 0 %
Eosinophils Absolute: 0.2 10*3/uL (ref 0.0–0.5)
Eosinophils Relative: 2 %
HCT: 30.6 % — ABNORMAL LOW (ref 36.0–46.0)
Hemoglobin: 9.5 g/dL — ABNORMAL LOW (ref 12.0–15.0)
IMMATURE GRANULOCYTES: 1 %
Lymphocytes Relative: 11 %
Lymphs Abs: 1.3 10*3/uL (ref 0.7–4.0)
MCH: 31.4 pg (ref 26.0–34.0)
MCHC: 31 g/dL (ref 30.0–36.0)
MCV: 101 fL — ABNORMAL HIGH (ref 80.0–100.0)
Monocytes Absolute: 1.5 10*3/uL — ABNORMAL HIGH (ref 0.1–1.0)
Monocytes Relative: 12 %
NEUTROS ABS: 8.9 10*3/uL — AB (ref 1.7–7.7)
Neutrophils Relative %: 74 %
Platelets: 383 10*3/uL (ref 150–400)
RBC: 3.03 MIL/uL — ABNORMAL LOW (ref 3.87–5.11)
RDW: 15.2 % (ref 11.5–15.5)
WBC: 12.1 10*3/uL — ABNORMAL HIGH (ref 4.0–10.5)
nRBC: 0 % (ref 0.0–0.2)

## 2018-02-23 LAB — MAGNESIUM: Magnesium: 2.1 mg/dL (ref 1.7–2.4)

## 2018-02-23 MED ORDER — SODIUM CHLORIDE 0.9 % IV BOLUS
500.0000 mL | Freq: Once | INTRAVENOUS | Status: AC
  Administered 2018-02-23: 500 mL via INTRAVENOUS

## 2018-02-23 MED ORDER — POTASSIUM CHLORIDE CRYS ER 20 MEQ PO TBCR
40.0000 meq | EXTENDED_RELEASE_TABLET | ORAL | Status: AC
  Administered 2018-02-23 (×2): 40 meq via ORAL
  Filled 2018-02-23 (×2): qty 2

## 2018-02-23 NOTE — Consult Note (Signed)
Consultation Note Date: 02/23/2018   Patient Name: Monique Russo  DOB: 13-Nov-1930  MRN: [id number]  Age / Sex: 84 y.o., female  PCP: [name], MD Referring Physician: Aline August, MD  Reason for Consultation: Establishing goals of care  HPI/Patient Profile: 84 y.o. female  with past medical history of chronic pain syndrome, history of PE on Xarelto, pulmonary hypertension, perforated gastric ulcer, diastolic dysfunction who has been living at long-term facility [facility]) admitted on 02/21/2018 with altered mental status likely secondary to pneumonia.  Palliative consulted for goals of care.  Clinical Assessment and Goals of Care: I met today with patient and her daughter at the bedside.  Ms. Amundson slept through majority of encounter but awoke at the end of meeting and was disappointed as she initially confused me for her husband who she thought had come to visit her.  She did realize that this was not in fact the case once she woke up more.  She denies having other needs at this time and reports that she misses her husband.  Prior to her wakening, her daughter and I had a long discussion of her mother's health over the last several years.  She recalls meeting with palliative care on several occasions and expresses that family desire remains the same: to treat potentially treatable conditions, but not to prolong life at the sacrifice of her quality of life when she is approaching the end of her life.    She reports that since her last hospitalization, her husband has also become sick and is at a different SNF.  He is no longer able to participate in decision making and this is something that she and her sister have taken on.  Sallyreports that her mother is much more alert and awake today, and plan to continue current interventions for now.  Questions and concerns addressed.   PMT will continue  to support holistically.  SUMMARY OF RECOMMENDATIONS   - DNR/DNI - Continue to treat potentially reversible conditions and plan to reassess in 48 hours.  Her daughter reports that family does not want to prolong suffering if we are at end of life, however, she states that patient has been in similar situation multiple times in the past, family has expressed desire to ensure she is comfortable, and then Ms. Conaty has recovered.   - F/u meeting on Monday.  Daughter has my card and will call with any needs before Monday.  Code Status/Advance Care Planning:  DNR   Symptom Management:   Chronic pain: Methadone currently held and patient more awake today. Lethargy likely multifactorial as she also has PNA.  Would continue to monitor closely for signs of withdrawal with consideration for restarting at lower dose once she is more stabilized.  Palliative Prophylaxis:   Bowel Regimen and Frequent Pain Assessment  Psycho-social/Spiritual:   Desire for further Chaplaincy support:No  Additional Recommendations: Caregiving  Support/Resources  Prognosis:   Unable to determine  Discharge Planning: To Be Determined      Primary Diagnoses: Present on  Admission: . Sepsis, Gram negative ([place]) . GERD (gastroesophageal reflux disease) . Depression with anxiety . Chronic pain syndrome . Pneumonia   I have reviewed the medical record, interviewed the patient and family, and examined the patient. The following aspects are pertinent.  Past Medical History:  Diagnosis Date  . Anemia   . Arthritis   . CHF (congestive heart failure) ([place])   . Chronic bilateral low back pain without sciatica   . Depression   . GERD (gastroesophageal reflux disease)   . Hyponatremia 01/2016  . Insomnia 05/20/2016  . Perforated chronic gastric ulcer ([place]) 08/11/2014  . Pneumonia   . Pulmonary arterial hypertension ([place]) 11/28/2014   Social History   Socioeconomic History  . Marital status: Married     Spouse name: Not on file  . Number of children: Not on file  . Years of education: Not on file  . Highest education level: Not on file  Occupational History  . Not on file  Social Needs  . Financial resource strain: Not hard at all  . Food insecurity:    Worry: Never true    Inability: Never true  . Transportation needs:    Medical: No    Non-medical: No  Tobacco Use  . Smoking status: Never Smoker  . Smokeless tobacco: Never Used  Substance and Sexual Activity  . Alcohol use: Not Currently    Alcohol/week: 7.0 standard drinks    Types: 7 Glasses of wine per week    Comment: 8 oz -12 oz of wine every   . Drug use: No  . Sexual activity: Not on file  Lifestyle  . Physical activity:    Days per week: 0 days    Minutes per session: 0 min  . Stress: To some extent  Relationships  . Social connections:    Talks on phone: More than three times a week    Gets together: Twice a week    Attends religious service: Never    Active member of club or organization: No    Attends meetings of clubs or organizations: Never    Relationship status: Married  Other Topics Concern  . Not on file  Social History Narrative  . Not on file   Family History  Problem Relation Age of Onset  . Stroke Mother   . Breast cancer Sister   . Ulcerative colitis Daughter   . Chronic fatigue Daughter   . Chronic fatigue Daughter    Scheduled Meds: . diclofenac sodium  2 g Topical BID  . divalproex  500 mg Oral QHS  . DULoxetine  90 mg Oral QHS  . fluticasone  2 spray Each Nare QHS  . hydrocortisone cream   Topical BID  . loratadine  10 mg Oral Daily  . magnesium hydroxide  60 mL Oral QODAY  . methocarbamol  750 mg Oral BID  . multivitamin with minerals  1 tablet Oral Daily  . polyethylene glycol  17 g Oral BID  . potassium chloride  40 mEq Oral Q4H  . rivaroxaban  20 mg Oral Daily  . timolol  1 drop Both Eyes BID  . torsemide  60 mg Oral BID   Continuous Infusions: . sodium chloride  Stopped (02/21/18 2046)  . ceFEPime (MAXIPIME) IV 2 g (02/23/18 0649)   PRN Meds:.sodium chloride, acetaminophen, bisacodyl, ondansetron **OR** ondansetron (ZOFRAN) IV, oxyCODONE Medications Prior to Admission:  Prior to Admission medications   Medication Sig Start Date End Date Taking? Authorizing Provider  acetaminophen (TYLENOL)  325 MG tablet Take 325 mg by mouth every 6 (six) hours as needed for mild pain.  07/10/13  Yes Dellinger, Bobby Rumpf, PA-C  desonide (DESOWEN) 0.05 % cream Apply 1 application topically 2 (two) times daily.   Yes [provider]  diclofenac sodium (VOLTAREN) 1 % GEL Apply 1 g topically 2 (two) times daily. Apply to bilateral knees for pain   Yes [provider]  divalproex (DEPAKOTE SPRINKLE) 125 MG capsule Give 4 Capsules (500 mg) by mouth at bedtime   Yes [provider]  DULoxetine HCl (CYMBALTA PO) Take 90 mg by mouth at bedtime.   Yes [provider]  fluticasone (FLONASE) 50 MCG/ACT nasal spray Place 2 sprays into both nostrils at bedtime.    Yes [provider]  gabapentin (NEURONTIN) 300 MG capsule Take 300 mg by mouth 2 (two) times daily.   Yes [provider]  loratadine (CLARITIN) 10 MG tablet Take 10 mg by mouth daily. 01/20/17  Yes [provider]  Melatonin 3 MG TABS Take 6 mg by mouth at bedtime.  09/20/17  Yes [provider]  methadone (DOLOPHINE) 5 MG tablet Take 1 tablet (5 mg total) by mouth every 12 (twelve) hours. 12/24/17  Yes [name], [name], DO  methocarbamol (ROBAXIN) 750 MG tablet Take 750 mg by mouth every 12 (twelve) hours.  08/21/17  Yes [provider]  Multiple Vitamin (MULTIVITAMIN) tablet Take 1 tablet by mouth 3 (three) times daily.    Yes [provider]  oxyCODONE (ROXICODONE) 5 MG immediate release tablet Take 1 tablet (5 mg total) by mouth every 4 (four) hours as needed for severe pain. 10/30/17  Yes [name], NP  polyethylene glycol (MIRALAX  / GLYCOLAX) packet Take 17 g by mouth 2 (two) times daily.    Yes [provider]  potassium chloride (K-DUR) 10 MEQ tablet Take 10 mEq by mouth daily.   Yes [provider]  rivaroxaban (XARELTO) 20 MG TABS tablet Take 20 mg by mouth daily.    Yes [provider]  temazepam (RESTORIL) 30 MG capsule Take 1 capsule (30 mg total) by mouth at bedtime. 01/14/18  Yes [name], [name], DO  timolol (BETIMOL) 0.5 % ophthalmic solution Place 1 drop into both eyes 2 (two) times daily.    Yes [provider]  torsemide (DEMADEX) 20 MG tablet Take 60 mg by mouth 2 (two) times daily.   Yes [provider]  bisacodyl (DULCOLAX) 10 MG suppository Place 1 suppository (10 mg total) rectally daily as needed for moderate constipation (May repeat times one). 07/10/13   Dellinger, Bobby Rumpf, PA-C  magnesium hydroxide (MILK OF MAGNESIA) 400 MG/5ML suspension Take 60 mLs by mouth every other day. AS NEEDED FOR CONSTIPATION    [provider]   Allergies  Allergen Reactions  . Tape Other (See Comments)    SKIN IS VERY THIN; IT BRUISES AND TEARS EASILY!! -THX   Review of Systems  ? Mild confusion. Denies complaints  Physical Exam General: Sleepy but arouses.  Chronically ill appearing.  HEENT: No bruits, no goiter, no JVD Heart: Regular rate and rhythm. No murmur appreciated. Lungs: Decreased.  Scattered crackles Abdomen: Soft, nontender, nondistended, positive bowel sounds.  Ext: No significant edema Skin: Warm and dry  Vital Signs: BP (!) 81/46 (BP Location: Left Arm)   Pulse 81   Temp 99.5 F (37.5 C) (Oral)   Resp 18   Ht 5' 2.5" (1.588 m)   Wt 99.2 kg  SpO2 97%   BMI 39.36 kg/m  Pain Scale: 0-10   Pain Score: Asleep   SpO2: SpO2: 97 % O2 Device:SpO2: 97 % O2 Flow Rate: .O2 Flow Rate (L/min): 4 L/min  IO: Intake/output summary:   Intake/Output Summary (Last 24 hours) at 02/23/2018 1114 Last data filed at 02/23/2018 6789 Gross per 24  hour  Intake 2978.5 ml  Output -  Net 2978.5 ml    LBM: Last BM Date: 02/22/18 Baseline Weight: Weight: 99.3 kg Most recent weight: Weight: 99.2 kg     Palliative Assessment/Data:     Time In: 0900 Time Out: 1000 Time Total: 60 Greater than 50%  of this time was spent counseling and coordinating care related to the above assessment and plan.  Signed by: Micheline Rough, MD   Please contact Palliative Medicine Team phone at [phone] for questions and concerns.  For individual provider: See Shea Evans

## 2018-02-23 NOTE — Progress Notes (Signed)
Patient ID: Monique Russo, female   DOB: 01-09-1931, 84 y.o.   MRN: [id number]  PROGRESS NOTE    Krishauna Schatzman  WUJ:[id number] DOB: 11/03/1930 DOA: 02/21/2018 PCP: [name], MD   Brief Narrative:  84 year old female with history of chronic pain syndrome, PE on Xarelto, pulmonary hypertension, prior pneumonia, perforated gastric ulcer and chronic diastolic CHF presented from skilled nursing facility with fatigue and fever with altered mental status.  Chest x-ray was showing pulmonary edema and bilateral pleural effusions and patchy lower lobe opacities.  She was started on IV fluids and antibiotics.  Assessment & Plan:   Principal Problem:   Sepsis, Gram negative ([place]) Active Problems:   Chronic pain syndrome   GERD (gastroesophageal reflux disease)   Depression with anxiety   Pneumonia   Sepsis probably from pneumonia -Continue antibiotics.  Follow cultures.  Hemodynamically stable currently  Probable healthcare associated pneumonia with concern for gram-negative versus MRSA pneumonia -Currently on cefepime, and vancomycin.  No evidence of MRSA pneumonia.  Discontinue Vancmycin.  Follow cultures. -Rapid influenza negative -Oxygen supplementation as needed -SLP evaluation  Leukocytosis -Probably secondary to above.  Improving.  Monitor  Acute metabolic encephalopathy -Most likely from pneumonia in a patient with history of chronic pain syndrome and chronic narcotic use. -She wakes up slightly today and tries to answer some questions but then falls back to sleep.  Still confused.  Daughter states that patient does not have dementia.    Fall precautions.  Continue hold narcotics including methadone for now. -Palliative care consultation for goals of care is pending. -If condition does not improve, consider hospice/comfort measures  Hypokalemia -Replace.  Repeat a.m. Labs  Chronic diastolic heart failure -Strict input and output.  Current urine output has not been  recorded properly.  Daily weights.  Continue torsemide.  Repeat chest x-ray in a.m.  Chronic pain syndrome -Hold narcotics for now.  If the patient starts to wake up and starts complaining of extreme pain, restart pain meds with caution.  Daughter at bedside is aware.  History of DVT and PE -continue Xarelto  Obesity -Outpatient follow-up  DVT prophylaxis: Xarelto Code Status: DNR Family Communication: Spoke to daughter at bedside Disposition Plan: Depends on clinical outcome  Consultants: Palliative care  Procedures: None  Antimicrobials: Cefepime and vancomycin from 02/21/2018 onwards Flagyl 02/21/2018-02/22/2018   Subjective: Patient seen and examined at bedside.  She wakes up slightly on calling her name and answers very little questions.  She can goes back to sleep.  Daughter present at bedside.  No overnight fever or vomiting. Objective: Vitals:   02/22/18 1352 02/22/18 2112 02/23/18 0500 02/23/18 0517  BP: 93/69 (!) 97/51  (!) 81/46  Pulse: 83 77  81  Resp: 18 18  18   Temp: 98.3 F (36.8 C) 98.8 F (37.1 C)  99.5 F (37.5 C)  TempSrc: Oral Oral  Oral  SpO2: 95% 97%  97%  Weight:   99.2 kg   Height:        Intake/Output Summary (Last 24 hours) at 02/23/2018 1026 Last data filed at 02/23/2018 0517 Gross per 24 hour  Intake 1174.64 ml  Output -  Net 1174.64 ml   Filed Weights   02/21/18 1700 02/23/18 0500  Weight: 99.3 kg 99.2 kg    Examination:  General exam: Elderly female lying in bed, wakes up slightly and answers some questions but then goes back to sleep.  Looks chronically ill. Respiratory system: Bilateral decreased breath sounds at bases with scattered crackles  Cardiovascular system: S1 & S2 heard, Rate controlled Gastrointestinal system: Abdomen is obese, nondistended, soft and nontender. Normal bowel sounds heard. Extremities: No cyanosis; pitting edema present   Data Reviewed: I have personally reviewed following labs and imaging  studies  CBC: Recent Labs  Lab 02/21/18 1655 02/22/18 0530 02/23/18 0443  WBC 16.8* 13.0* 12.1*  NEUTROABS 13.2*  --  8.9*  HGB 10.5* 9.4* 9.5*  HCT 33.3* 30.6* 30.6*  MCV 99.7 100.3* 101.0*  PLT 445* 379 431   Basic Metabolic Panel: Recent Labs  Lab 02/21/18 1655 02/22/18 0530 02/23/18 0443  NA 137 138 142  K 3.5 3.4* 3.2*  CL 92* 98 97*  CO2 33* 31 34*  GLUCOSE 106* 103* 94  BUN 14 13 13   CREATININE 0.92 0.67 0.83  CALCIUM 9.0 8.6* 9.1  MG  --   --  2.1   GFR: Estimated Creatinine Clearance: 55.2 mL/min (by C-G formula based on SCr of 0.83 mg/dL). Liver Function Tests: Recent Labs  Lab 02/21/18 1655 02/22/18 0530 02/23/18 0443  AST 12* 10* 11*  ALT 9 7 6   ALKPHOS 109 115 81  BILITOT 0.8 0.8 0.7  PROT 7.3 6.4* 6.5  ALBUMIN 2.1* 2.0* 1.9*   No results for input(s): LIPASE, AMYLASE in the last 168 hours. No results for input(s): AMMONIA in the last 168 hours. Coagulation Profile: Recent Labs  Lab 02/21/18 1655  INR 2.34   Cardiac Enzymes: No results for input(s): CKTOTAL, CKMB, CKMBINDEX, TROPONINI in the last 168 hours. BNP (last 3 results) No results for input(s): PROBNP in the last 8760 hours. HbA1C: No results for input(s): HGBA1C in the last 72 hours. CBG: No results for input(s): GLUCAP in the last 168 hours. Lipid Profile: No results for input(s): CHOL, HDL, LDLCALC, TRIG, CHOLHDL, LDLDIRECT in the last 72 hours. Thyroid Function Tests: No results for input(s): TSH, T4TOTAL, FREET4, T3FREE, THYROIDAB in the last 72 hours. Anemia Panel: No results for input(s): VITAMINB12, FOLATE, FERRITIN, TIBC, IRON, RETICCTPCT in the last 72 hours. Sepsis Labs: Recent Labs  Lab 02/21/18 1657  LATICACIDVEN 1.03    Recent Results (from the past 240 hour(s))  Culture, blood (Routine x 2)     Status: None (Preliminary result)   Collection Time: 02/21/18  4:56 PM  Result Value Ref Range Status   Specimen Description   Final    BLOOD RIGHT  ANTECUBITAL Performed at [facility] [address], [place], [place] [address]    Special Requests   Final    BOTTLES DRAWN AEROBIC AND ANAEROBIC Blood Culture adequate volume Performed at [facility] [address], [place], [place] [address]    Culture   Final    NO GROWTH < 24 HOURS Performed at [facility] [address], [place], [place] [address]    Report Status PENDING  Incomplete  Culture, blood (Routine x 2)     Status: None (Preliminary result)   Collection Time: 02/21/18  4:57 PM  Result Value Ref Range Status   Specimen Description   Final    BLOOD LEFT ANTECUBITAL Performed at [facility] [address], [place], [place] [address]    Special Requests   Final    BOTTLES DRAWN AEROBIC AND ANAEROBIC Blood Culture results may not be optimal due to an excessive volume of blood received in culture bottles Performed at [facility] [address] [place], [place] [address]    Culture   Final  NO GROWTH < 24 HOURS Performed at [facility] [address], [place], [place] [address]    Report Status PENDING  Incomplete  MRSA PCR Screening     Status: None   Collection Time: 02/22/18  5:39 AM  Result Value Ref Range Status   MRSA by PCR NEGATIVE NEGATIVE Final    Comment:        The GeneXpert MRSA Assay (FDA approved for NASAL specimens only), is one component of a comprehensive MRSA colonization surveillance program. It is not intended to diagnose MRSA infection nor to guide or monitor treatment for MRSA infections. Performed at [facility], [place] [address], [place], [place] [address]          Radiology Studies: Dg Chest 2 View  Result Date: 02/21/2018 CLINICAL DATA:  Hypoxia EXAM: CHEST - 2 VIEW COMPARISON:  01/20/2016 chest radiograph. FINDINGS: Bilateral total shoulder arthroplasty. Stable cardiomediastinal silhouette with mild  cardiomegaly. No pneumothorax. Small bilateral pleural effusions with patchy bilateral lower lobe lung opacities. Borderline mild pulmonary edema. IMPRESSION: 1. Borderline mild congestive heart failure with small bilateral pleural effusions. 2. Nonspecific patchy bilateral lower lobe lung opacities, favor atelectasis. Electronically Signed   By: Ilona Sorrel M.D.   On: 02/21/2018 17:15        Scheduled Meds: . diclofenac sodium  2 g Topical BID  . divalproex  500 mg Oral QHS  . DULoxetine  90 mg Oral QHS  . fluticasone  2 spray Each Nare QHS  . hydrocortisone cream   Topical BID  . loratadine  10 mg Oral Daily  . magnesium hydroxide  60 mL Oral QODAY  . methocarbamol  750 mg Oral BID  . multivitamin with minerals  1 tablet Oral Daily  . polyethylene glycol  17 g Oral BID  . potassium chloride  10 mEq Oral Daily  . rivaroxaban  20 mg Oral Daily  . timolol  1 drop Both Eyes BID  . torsemide  60 mg Oral BID   Continuous Infusions: . sodium chloride Stopped (02/21/18 2046)  . ceFEPime (MAXIPIME) IV 2 g (02/23/18 0649)  . vancomycin 1,000 mg (02/22/18 1726)     LOS: 2 days        Aline August, MD [facility] Pager [phone]  If 7PM-7AM, please contact night-coverage [email or web address] Password [email or web address] 02/23/2018, 10:26 AM

## 2018-02-23 NOTE — Progress Notes (Signed)
SLP Cancellation Note  Patient Details Name: Monique Russo MRN: [id number] DOB: 01-05-1931   Cancelled treatment:       Reason Eval/Treat Not Completed: Fatigue/lethargy limiting ability to participate; discussed pt's previous level of function with daughter and she denies any dysphagia hx; ST will continue efforts    Elvina Sidle, M.S., CCC-SLP 02/23/2018, 12:49 PM

## 2018-02-23 NOTE — Progress Notes (Signed)
  Echocardiogram 2D Echocardiogram has been performed.  Monique Russo 02/23/2018, 9:09 AM

## 2018-02-24 ENCOUNTER — Inpatient Hospital Stay (HOSPITAL_COMMUNITY): Payer: PPO

## 2018-02-24 LAB — CBC WITH DIFFERENTIAL/PLATELET
Abs Immature Granulocytes: 0.05 10*3/uL (ref 0.00–0.07)
BASOS PCT: 0 %
Basophils Absolute: 0 10*3/uL (ref 0.0–0.1)
EOS PCT: 3 %
Eosinophils Absolute: 0.3 10*3/uL (ref 0.0–0.5)
HCT: 31.1 % — ABNORMAL LOW (ref 36.0–46.0)
Hemoglobin: 9.5 g/dL — ABNORMAL LOW (ref 12.0–15.0)
Immature Granulocytes: 1 %
Lymphocytes Relative: 13 %
Lymphs Abs: 1.4 10*3/uL (ref 0.7–4.0)
MCH: 30.5 pg (ref 26.0–34.0)
MCHC: 30.5 g/dL (ref 30.0–36.0)
MCV: 100 fL (ref 80.0–100.0)
Monocytes Absolute: 1.5 10*3/uL — ABNORMAL HIGH (ref 0.1–1.0)
Monocytes Relative: 14 %
Neutro Abs: 7.5 10*3/uL (ref 1.7–7.7)
Neutrophils Relative %: 69 %
Platelets: 435 10*3/uL — ABNORMAL HIGH (ref 150–400)
RBC: 3.11 MIL/uL — ABNORMAL LOW (ref 3.87–5.11)
RDW: 15.2 % (ref 11.5–15.5)
WBC: 10.8 10*3/uL — ABNORMAL HIGH (ref 4.0–10.5)
nRBC: 0 % (ref 0.0–0.2)

## 2018-02-24 LAB — BASIC METABOLIC PANEL
Anion gap: 9 (ref 5–15)
BUN: 18 mg/dL (ref 8–23)
CO2: 34 mmol/L — ABNORMAL HIGH (ref 22–32)
CREATININE: 0.95 mg/dL (ref 0.44–1.00)
Calcium: 9.4 mg/dL (ref 8.9–10.3)
Chloride: 98 mmol/L (ref 98–111)
GFR calc Af Amer: >60 mL/min (ref >60)
GFR calc non Af Amer: 55 mL/min — ABNORMAL LOW (ref >60)
Glucose, Bld: 93 mg/dL (ref 70–99)
Potassium: 4.2 mmol/L (ref 3.5–5.1)
Sodium: 141 mmol/L (ref 135–145)

## 2018-02-24 LAB — MAGNESIUM: Magnesium: 2.2 mg/dL (ref 1.7–2.4)

## 2018-02-24 MED ORDER — SODIUM CHLORIDE 0.9 % IV BOLUS
500.0000 mL | Freq: Once | INTRAVENOUS | Status: AC
  Administered 2018-02-24: 500 mL via INTRAVENOUS

## 2018-02-24 MED ORDER — SODIUM CHLORIDE 0.9 % IV SOLN
1.0000 g | Freq: Two times a day (BID) | INTRAVENOUS | Status: DC
  Administered 2018-02-25 – 2018-02-27 (×5): 1 g via INTRAVENOUS
  Filled 2018-02-24 (×6): qty 1

## 2018-02-24 NOTE — Progress Notes (Signed)
Pt. Daughter runs into hallway visibly upset, she screams RN and NT have improperly cleaned her mother. This RN nearby states she can be recleaned in a moment. Daughter continues screaming that she needs to be cleaned now and wanting to know this writers name. Before this writer can speak to her she becomes confrontational and reaches to grab this RN aggressive and screaming. This RN calmly states security will be notified if she reaches at Probation officer again. Patient continues screaming down hall back to patient room. Charge RN informed, this Probation officer reassigned to another pt.

## 2018-02-24 NOTE — Progress Notes (Signed)
Patient ID: Monique Russo, female   DOB: September 15, 1930, 84 y.o.   MRN: [id number]  PROGRESS NOTE    Monique Russo  WUJ:[id number] DOB: 14-May-1930 DOA: 02/21/2018 PCP: Monique Pretty, MD   Brief Narrative:  84 year old female with history of chronic pain syndrome, PE on Xarelto, pulmonary hypertension, prior pneumonia, perforated gastric ulcer and chronic diastolic CHF presented from skilled nursing facility with fatigue and fever with altered mental status.  Chest x-ray was showing pulmonary edema and bilateral pleural effusions and patchy lower lobe opacities.  She was started on IV fluids and antibiotics.  Assessment & Plan:   Principal Problem:   Sepsis, Gram negative ([place]) Active Problems:   Chronic pain syndrome   GERD (gastroesophageal reflux disease)   Depression with anxiety   Pneumonia   Sepsis probably from pneumonia -Continue antibiotics.  Follow cultures.  Hemodynamically stable currently  Acute hypoxic respiratory failure probably secondary to pneumonia -Present on admission -Wean off as able  Probable healthcare associated pneumonia with concern for gram-negative versus MRSA pneumonia -Currently on cefepime.  Off vancomycin.  Cultures negative so far -Rapid influenza negative -Currently on oxygen supplementation at 4 L/min -Diet as per SLP evaluation  Leukocytosis -Probably secondary to above.  Improved.  Monitor  Acute metabolic encephalopathy -Most likely from pneumonia in a patient with history of chronic pain syndrome and chronic narcotic use. -Mental status has not improved much.  Patient hardly wakes up this morning and does not answer much questions.  Daughter states that her mental status has gotten worse since yesterday afternoon.  Continue hold narcotics including methadone for now. -Palliative care following. -If condition does not improve in the next 24 to 48 hours, consider hospice/comfort measures  Hypokalemia -Replaced.  Repeat a.m.  Labs  Hypoalbuminemia -Probably second to poor oral intake.  Follow nutrition recommendations  Chronic diastolic heart failure -Strict input and output.  Current urine output has not been recorded properly.  Daily weights.  Continue torsemide.  Will get repeat chest x-ray today  Chronic pain syndrome -Hold narcotics for now.  If the patient starts to wake up and starts complaining of extreme pain, restart pain meds with caution.  Daughter at bedside is aware.  History of DVT and PE -continue Xarelto  Obesity -Outpatient follow-up  DVT prophylaxis: Xarelto Code Status: DNR Family Communication: Spoke to daughter at bedside Disposition Plan: Depends on clinical outcome  Consultants: Palliative care  Procedures: None  Antimicrobials: Cefepime from 02/21/2018 onwards Vancomycin 02/21/2018-02/23/2018 Flagyl 02/21/2018-02/22/2018   Subjective: Patient seen and examined at bedside.  She is very drowsy, opens her eyes slightly but does not answer questions.  Daughter is present at bedside.  No overnight fever or vomiting reported.   Objective: Vitals:   02/23/18 0710 02/23/18 1355 02/24/18 0500 02/24/18 0551  BP: (!) 94/56 127/78  (!) 88/53  Pulse: 84 82  88  Resp:  16  18  Temp:  (!) 97.4 F (36.3 C)  100 F (37.8 C)  TempSrc:  Oral  Oral  SpO2:  97%  95%  Weight:   99.2 kg   Height:        Intake/Output Summary (Last 24 hours) at 02/24/2018 1003 Last data filed at 02/23/2018 1400 Gross per 24 hour  Intake 1673.39 ml  Output -  Net 1673.39 ml   Filed Weights   02/21/18 1700 02/23/18 0500 02/24/18 0500  Weight: 99.3 kg 99.2 kg 99.2 kg    Examination:  General exam: Elderly female lying in bed, extremely drowsy,  opens her eyes only slightly but does not answer questions.  No distress.  Looks chronically ill  respiratory system: Bilateral decreased breath sounds at bases with scattered crackles, no wheezing Cardiovascular system: Rate controlled, S1-S2  heard Gastrointestinal system: Abdomen is obese, nondistended, soft and nontender. Normal bowel sounds heard. Extremities: No cyanosis; pitting edema present   Data Reviewed: I have personally reviewed following labs and imaging studies  CBC: Recent Labs  Lab 02/21/18 1655 02/22/18 0530 02/23/18 0443 02/24/18 0525  WBC 16.8* 13.0* 12.1* 10.8*  NEUTROABS 13.2*  --  8.9* 7.5  HGB 10.5* 9.4* 9.5* 9.5*  HCT 33.3* 30.6* 30.6* 31.1*  MCV 99.7 100.3* 101.0* 100.0  PLT 445* 379 383 161*   Basic Metabolic Panel: Recent Labs  Lab 02/21/18 1655 02/22/18 0530 02/23/18 0443 02/24/18 0525  NA 137 138 142 141  K 3.5 3.4* 3.2* 4.2  CL 92* 98 97* 98  CO2 33* 31 34* 34*  GLUCOSE 106* 103* 94 93  BUN 14 13 13 18   CREATININE 0.92 0.67 0.83 0.95  CALCIUM 9.0 8.6* 9.1 9.4  MG  --   --  2.1 2.2   GFR: Estimated Creatinine Clearance: 48.2 mL/min (by C-G formula based on SCr of 0.95 mg/dL). Liver Function Tests: Recent Labs  Lab 02/21/18 1655 02/22/18 0530 02/23/18 0443  AST 12* 10* 11*  ALT 9 7 6   ALKPHOS 109 115 81  BILITOT 0.8 0.8 0.7  PROT 7.3 6.4* 6.5  ALBUMIN 2.1* 2.0* 1.9*   No results for input(s): LIPASE, AMYLASE in the last 168 hours. No results for input(s): AMMONIA in the last 168 hours. Coagulation Profile: Recent Labs  Lab 02/21/18 1655  INR 2.34   Cardiac Enzymes: No results for input(s): CKTOTAL, CKMB, CKMBINDEX, TROPONINI in the last 168 hours. BNP (last 3 results) No results for input(s): PROBNP in the last 8760 hours. HbA1C: No results for input(s): HGBA1C in the last 72 hours. CBG: No results for input(s): GLUCAP in the last 168 hours. Lipid Profile: No results for input(s): CHOL, HDL, LDLCALC, TRIG, CHOLHDL, LDLDIRECT in the last 72 hours. Thyroid Function Tests: No results for input(s): TSH, T4TOTAL, FREET4, T3FREE, THYROIDAB in the last 72 hours. Anemia Panel: No results for input(s): VITAMINB12, FOLATE, FERRITIN, TIBC, IRON, RETICCTPCT in the  last 72 hours. Sepsis Labs: Recent Labs  Lab 02/21/18 1657  LATICACIDVEN 1.03    Recent Results (from the past 240 hour(s))  Culture, blood (Routine x 2)     Status: None (Preliminary result)   Collection Time: 02/21/18  4:56 PM  Result Value Ref Range Status   Specimen Description   Final    BLOOD RIGHT ANTECUBITAL Performed at [facility] [address] [place], [place] [address]    Special Requests   Final    BOTTLES DRAWN AEROBIC AND ANAEROBIC Blood Culture adequate volume Performed at [facility] [address], [place], [place] [address]    Culture   Final    NO GROWTH 2 DAYS Performed at [facility] [address], [place], [place] [address]    Report Status PENDING  Incomplete  Culture, blood (Routine x 2)     Status: None (Preliminary result)   Collection Time: 02/21/18  4:57 PM  Result Value Ref Range Status   Specimen Description   Final    BLOOD LEFT ANTECUBITAL Performed at [facility] [address], [place], [place] [address]    Special Requests  Final    BOTTLES DRAWN AEROBIC AND ANAEROBIC Blood Culture results may not be optimal due to an excessive volume of blood received in culture bottles Performed at [facility] [address] [place], [place] [address]    Culture   Final    NO GROWTH 2 DAYS Performed at [facility] [address] [place], [place] [address]    Report Status PENDING  Incomplete  MRSA PCR Screening     Status: None   Collection Time: 02/22/18  5:39 AM  Result Value Ref Range Status   MRSA by PCR NEGATIVE NEGATIVE Final    Comment:        The GeneXpert MRSA Assay (FDA approved for NASAL specimens only), is one component of a comprehensive MRSA colonization surveillance program. It is not intended to diagnose MRSA infection nor to guide or monitor treatment for MRSA infections. Performed at [facility], [place] [address] [place], [place] [address]          Radiology Studies: No results found.      Scheduled Meds: . diclofenac sodium  2 g Topical BID  . divalproex  500 mg Oral QHS  . DULoxetine  90 mg Oral QHS  . fluticasone  2 spray Each Nare QHS  . hydrocortisone cream   Topical BID  . loratadine  10 mg Oral Daily  . magnesium hydroxide  60 mL Oral QODAY  . methocarbamol  750 mg Oral BID  . multivitamin with minerals  1 tablet Oral Daily  . polyethylene glycol  17 g Oral BID  . rivaroxaban  20 mg Oral Daily  . timolol  1 drop Both Eyes BID  . torsemide  60 mg Oral BID   Continuous Infusions: . sodium chloride Stopped (02/21/18 2046)  . ceFEPime (MAXIPIME) IV 2 g (02/24/18 0551)     LOS: 3 days        Aline August, MD [facility] Pager [phone]  If 7PM-7AM, please contact night-coverage [email or web address] Password [email or web address] 02/24/2018, 10:03 AM

## 2018-02-24 NOTE — Progress Notes (Signed)
PHARMACY NOTE:  ANTIMICROBIAL RENAL DOSAGE ADJUSTMENT  Current antimicrobial regimen includes a mismatch between antimicrobial dosage and estimated renal function. As per policy approved by the Pharmacy & Therapeutics and Medical Executive Committees, the antimicrobial dosage will be adjusted accordingly.  Current antimicrobial and dosage:  Cefepime 2g IV q12 hr  Indication: sepsis, likely respiratory  Renal Function:   Estimated Creatinine Clearance: 48.2 mL/min (by C-G formula based on SCr of 0.95 mg/dL). []      On intermittent HD, scheduled: []      On CRRT    Antimicrobial dosage has been changed to:  Cefepime 1g IV q12 hr   Additional Comments: none   Thank you for allowing pharmacy to be a part of this patient's care.  Reuel Boom, PharmD, BCPS [phone] 02/24/2018, 6:12 PM

## 2018-02-25 LAB — CBC WITH DIFFERENTIAL/PLATELET
Abs Immature Granulocytes: 0.04 10*3/uL (ref 0.00–0.07)
Basophils Absolute: 0 10*3/uL (ref 0.0–0.1)
Basophils Relative: 0 %
EOS PCT: 3 %
Eosinophils Absolute: 0.3 10*3/uL (ref 0.0–0.5)
HCT: 31.6 % — ABNORMAL LOW (ref 36.0–46.0)
Hemoglobin: 9.7 g/dL — ABNORMAL LOW (ref 12.0–15.0)
Immature Granulocytes: 0 %
Lymphocytes Relative: 19 %
Lymphs Abs: 1.8 10*3/uL (ref 0.7–4.0)
MCH: 30.8 pg (ref 26.0–34.0)
MCHC: 30.7 g/dL (ref 30.0–36.0)
MCV: 100.3 fL — ABNORMAL HIGH (ref 80.0–100.0)
MONO ABS: 1.4 10*3/uL — AB (ref 0.1–1.0)
Monocytes Relative: 15 %
Neutro Abs: 5.9 10*3/uL (ref 1.7–7.7)
Neutrophils Relative %: 63 %
Platelets: 421 10*3/uL — ABNORMAL HIGH (ref 150–400)
RBC: 3.15 MIL/uL — ABNORMAL LOW (ref 3.87–5.11)
RDW: 15.1 % (ref 11.5–15.5)
WBC: 9.4 10*3/uL (ref 4.0–10.5)
nRBC: 0 % (ref 0.0–0.2)

## 2018-02-25 LAB — COMPREHENSIVE METABOLIC PANEL
ALT: 7 U/L (ref 0–44)
ANION GAP: 8 (ref 5–15)
AST: 11 U/L — ABNORMAL LOW (ref 15–41)
Albumin: 2.1 g/dL — ABNORMAL LOW (ref 3.5–5.0)
Alkaline Phosphatase: 76 U/L (ref 38–126)
BUN: 20 mg/dL (ref 8–23)
CO2: 38 mmol/L — ABNORMAL HIGH (ref 22–32)
Calcium: 9.6 mg/dL (ref 8.9–10.3)
Chloride: 94 mmol/L — ABNORMAL LOW (ref 98–111)
Creatinine, Ser: 0.85 mg/dL (ref 0.44–1.00)
GFR calc Af Amer: >60 mL/min (ref >60)
GFR calc non Af Amer: >60 mL/min (ref >60)
Glucose, Bld: 102 mg/dL — ABNORMAL HIGH (ref 70–99)
Potassium: 3.5 mmol/L (ref 3.5–5.1)
Sodium: 140 mmol/L (ref 135–145)
TOTAL PROTEIN: 6.9 g/dL (ref 6.5–8.1)
Total Bilirubin: 0.4 mg/dL (ref 0.3–1.2)

## 2018-02-25 LAB — MAGNESIUM: Magnesium: 2.3 mg/dL (ref 1.7–2.4)

## 2018-02-25 MED ORDER — MELATONIN 3 MG PO TABS
6.0000 mg | ORAL_TABLET | Freq: Every day | ORAL | Status: DC
  Administered 2018-02-25: 6 mg via ORAL
  Filled 2018-02-25: qty 2

## 2018-02-25 MED ORDER — METHADONE HCL 5 MG PO TABS
5.0000 mg | ORAL_TABLET | Freq: Two times a day (BID) | ORAL | Status: DC
  Administered 2018-02-25 (×2): 5 mg via ORAL
  Filled 2018-02-25 (×2): qty 1

## 2018-02-25 MED ORDER — GABAPENTIN 300 MG PO CAPS
300.0000 mg | ORAL_CAPSULE | Freq: Two times a day (BID) | ORAL | Status: DC
  Administered 2018-02-25 – 2018-02-27 (×5): 300 mg via ORAL
  Filled 2018-02-25 (×5): qty 1

## 2018-02-25 NOTE — Progress Notes (Signed)
Patient ID: Monique Russo, female   DOB: [birth year], 84 y.o.   MRN: [id number]  PROGRESS NOTE    Monique Russo  OJJ:[id number] DOB: [birth year] DOA: 02/21/2018 PCP: [name], MD   Brief Narrative:  84 year old female with history of chronic pain syndrome, PE on Xarelto, pulmonary hypertension, prior pneumonia, perforated gastric ulcer and chronic diastolic CHF presented from skilled nursing facility with fatigue and fever with altered mental status.  Chest x-ray was showing pulmonary edema and bilateral pleural effusions and patchy lower lobe opacities.  She was started on IV fluids and antibiotics.  Assessment & Plan:   Principal Problem:   Sepsis, Gram negative ([place]) Active Problems:   Chronic pain syndrome   GERD (gastroesophageal reflux disease)   Depression with anxiety   Pneumonia   Sepsis probably from pneumonia -Continue antibiotics.  Follow cultures.  Hemodynamically stable currently  Acute hypoxic respiratory failure probably secondary to pneumonia -Present on admission -Wean off as able.  Still requiring 4 L oxygen via nasal cannula  Probable healthcare associated pneumonia with concern for gram-negative versus MRSA pneumonia -Currently on cefepime.  Off vancomycin.  Cultures negative so far -Rapid influenza negative -Diet as per SLP evaluation  Leukocytosis -Probably secondary to above.  Improved.  Monitor  Acute metabolic encephalopathy -Most likely from pneumonia in a patient with history of chronic pain syndrome and chronic narcotic use. -Mental status has not improved much.  Patient is slightly more awake this morning and intermittently moaning but hardly answers questions.  Daughter wants to make her as comfortable as possible and even considering hospice but wants to talk to palliative care first.   -Since the condition is not improving, I recommend hospice/comfort measures.  Will restart patient's pain meds and antianxiety medications. -Palliative  care following.  Hypoalbuminemia -Probably secondary  to poor oral intake.  Follow nutrition recommendations  Chronic diastolic heart failure -Strict input and output.  Current urine output has not been recorded properly.  Daily weights.  Continue torsemide.  Repeat chest x-ray on 02/24/2018 showed persistent bibasilar atelectasis versus infiltrates  Chronic pain syndrome -Plan for narcotics as above. Daughter at bedside is aware.  History of DVT and PE -continue Xarelto  Obesity -Outpatient follow-up  DVT prophylaxis: Xarelto Code Status: DNR Family Communication: Spoke to daughter at bedside Disposition Plan: Depends on clinical outcome  Consultants: Palliative care  Procedures: None  Antimicrobials: Cefepime from 02/21/2018 onwards Vancomycin 02/21/2018-02/23/2018 Flagyl 02/21/2018-02/22/2018   Subjective: Patient seen and examined at bedside.  She is only slightly awake, does not answer his question appropriately.  Intermittently moaning.  No overnight fever or vomiting.   Objective: Vitals:   02/24/18 1300 02/24/18 1412 02/24/18 1415 02/25/18 0547  BP:  (!) 72/47 109/67 (!) 102/58  Pulse:  77 79 73  Resp:  16  19  Temp:  99 F (37.2 C)  99 F (37.2 C)  TempSrc:  Oral  Oral  SpO2: 95% 93% 97% 95%  Weight:    98.8 kg  Height:        Intake/Output Summary (Last 24 hours) at 02/25/2018 1006 Last data filed at 02/24/2018 1500 Gross per 24 hour  Intake 360 ml  Output -  Net 360 ml   Filed Weights   02/23/18 0500 02/24/18 0500 02/25/18 0547  Weight: 99.2 kg 99.2 kg 98.8 kg    Examination:  General exam: Elderly female lying in bed, more awake but does not answer questions much.  Intermittently moaning.   Looks chronically ill  espiratory  system: Bilateral decreased breath sounds at bases with scattered crackles Cardiovascular system: S1-S2 heard, rate controlled Gastrointestinal system: Abdomen is obese, nondistended, soft and nontender. Normal bowel sounds  heard. Extremities: No cyanosis; pitting edema present   Data Reviewed: I have personally reviewed following labs and imaging studies  CBC: Recent Labs  Lab 02/21/18 1655 02/22/18 0530 02/23/18 0443 02/24/18 0525 02/25/18 0459  WBC 16.8* 13.0* 12.1* 10.8* 9.4  NEUTROABS 13.2*  --  8.9* 7.5 5.9  HGB 10.5* 9.4* 9.5* 9.5* 9.7*  HCT 33.3* 30.6* 30.6* 31.1* 31.6*  MCV 99.7 100.3* 101.0* 100.0 100.3*  PLT 445* 379 383 435* 646*   Basic Metabolic Panel: Recent Labs  Lab 02/21/18 1655 02/22/18 0530 02/23/18 0443 02/24/18 0525 02/25/18 0459  NA 137 138 142 141 140  K 3.5 3.4* 3.2* 4.2 3.5  CL 92* 98 97* 98 94*  CO2 33* 31 34* 34* 38*  GLUCOSE 106* 103* 94 93 102*  BUN 14 13 13 18 20   CREATININE 0.92 0.67 0.83 0.95 0.85  CALCIUM 9.0 8.6* 9.1 9.4 9.6  MG  --   --  2.1 2.2 2.3   GFR: Estimated Creatinine Clearance: 53.7 mL/min (by C-G formula based on SCr of 0.85 mg/dL). Liver Function Tests: Recent Labs  Lab 02/21/18 1655 02/22/18 0530 02/23/18 0443 02/25/18 0459  AST 12* 10* 11* 11*  ALT 9 7 6 7   ALKPHOS 109 115 81 76  BILITOT 0.8 0.8 0.7 0.4  PROT 7.3 6.4* 6.5 6.9  ALBUMIN 2.1* 2.0* 1.9* 2.1*   No results for input(s): LIPASE, AMYLASE in the last 168 hours. No results for input(s): AMMONIA in the last 168 hours. Coagulation Profile: Recent Labs  Lab 02/21/18 1655  INR 2.34   Cardiac Enzymes: No results for input(s): CKTOTAL, CKMB, CKMBINDEX, TROPONINI in the last 168 hours. BNP (last 3 results) No results for input(s): PROBNP in the last 8760 hours. HbA1C: No results for input(s): HGBA1C in the last 72 hours. CBG: No results for input(s): GLUCAP in the last 168 hours. Lipid Profile: No results for input(s): CHOL, HDL, LDLCALC, TRIG, CHOLHDL, LDLDIRECT in the last 72 hours. Thyroid Function Tests: No results for input(s): TSH, T4TOTAL, FREET4, T3FREE, THYROIDAB in the last 72 hours. Anemia Panel: No results for input(s): VITAMINB12, FOLATE, FERRITIN,  TIBC, IRON, RETICCTPCT in the last 72 hours. Sepsis Labs: Recent Labs  Lab 02/21/18 1657  LATICACIDVEN 1.03    Recent Results (from the past 240 hour(s))  Culture, blood (Routine x 2)     Status: None (Preliminary result)   Collection Time: 02/21/18  4:56 PM  Result Value Ref Range Status   Specimen Description   Final    BLOOD RIGHT ANTECUBITAL Performed at [facility] [address], [place], [place] [address]    Special Requests   Final    BOTTLES DRAWN AEROBIC AND ANAEROBIC Blood Culture adequate volume Performed at [facility] [address], [place], [place] [address]    Culture   Final    NO GROWTH 3 DAYS Performed at [facility], [place] [address] [place], [place] [address]    Report Status PENDING  Incomplete  Culture, blood (Routine x 2)     Status: None (Preliminary result)   Collection Time: 02/21/18  4:57 PM  Result Value Ref Range Status   Specimen Description   Final    BLOOD LEFT ANTECUBITAL Performed at [facility] [address] [place], [place] [address]  Special Requests   Final    BOTTLES DRAWN AEROBIC AND ANAEROBIC Blood Culture results may not be optimal due to an excessive volume of blood received in culture bottles Performed at [facility] [address] [place], [place] [address]    Culture   Final    NO GROWTH 3 DAYS Performed at [facility], [place] [address], [place], [place] [address]    Report Status PENDING  Incomplete  MRSA PCR Screening     Status: None   Collection Time: 02/22/18  5:39 AM  Result Value Ref Range Status   MRSA by PCR NEGATIVE NEGATIVE Final    Comment:        The GeneXpert MRSA Assay (FDA approved for NASAL specimens only), is one component of a comprehensive MRSA colonization surveillance program. It is not intended to diagnose MRSA infection nor to guide or monitor treatment for MRSA infections. Performed at [facility], [place] [address], [place], [place] [address]          Radiology Studies: Dg Chest Port 1 View  Result Date: 02/24/2018 CLINICAL DATA:  Abnormal breath sounds.  Shortness of breath. EXAM: PORTABLE CHEST 1 VIEW COMPARISON:  02/21/2018 FINDINGS: The heart is mildly enlarged but stable. Stable tortuosity and calcification of the thoracic aorta. Bibasilar opacity suspicious for infiltrates or atelectasis. No definite pleural effusions. IMPRESSION: Persistent bibasilar atelectasis or infiltrates. Electronically Signed   By: Marijo Sanes M.D.   On: 02/24/2018 11:00        Scheduled Meds: . diclofenac sodium  2 g Topical BID  . divalproex  500 mg Oral QHS  . DULoxetine  90 mg Oral QHS  . fluticasone  2 spray Each Nare QHS  . hydrocortisone cream   Topical BID  . loratadine  10 mg Oral Daily  . magnesium hydroxide  60 mL Oral QODAY  . methocarbamol  750 mg Oral BID  . multivitamin with minerals  1 tablet Oral Daily  . polyethylene glycol  17 g Oral BID  . rivaroxaban  20 mg Oral Daily  . timolol  1 drop Both Eyes BID  . torsemide  60 mg Oral BID   Continuous Infusions: . sodium chloride Stopped (02/21/18 2046)  . ceFEPime (MAXIPIME) IV 1 g (02/25/18 0541)     LOS: 4 days        Aline August, MD [facility] Pager [phone]  If 7PM-7AM, please contact night-coverage [email or web address] Password [email or web address] 02/25/2018, 10:06 AM

## 2018-02-25 NOTE — Evaluation (Signed)
Clinical/Bedside Swallow Evaluation Patient Details  Name: Monique Russo MRN: [id number] Date of Birth: 04-08-1930  Today's Date: 02/25/2018 Time: SLP Start Time (ACUTE ONLY): 1150 SLP Stop Time (ACUTE ONLY): 1215 SLP Time Calculation (min) (ACUTE ONLY): 25 min  Past Medical History:  Past Medical History:  Diagnosis Date  . Anemia   . Arthritis   . CHF (congestive heart failure) ([place])   . Chronic bilateral low back pain without sciatica   . Depression   . GERD (gastroesophageal reflux disease)   . Hyponatremia 01/2016  . Insomnia 05/20/2016  . Perforated chronic gastric ulcer ([place]) 08/11/2014  . Pneumonia   . Pulmonary arterial hypertension ([place]) 11/28/2014   Past Surgical History:  Past Surgical History:  Procedure Laterality Date  . ABDOMINAL HYSTERECTOMY    . ABDOMINAL SURGERY    . BACK SURGERY  2000  . COLON SURGERY     x2-blockage  . COLONOSCOPY    . ERCP    . HERNIA REPAIR  2012   x2 with mesh  . JOINT REPLACEMENT Left    shoulder  . LAPAROTOMY N/A 08/04/2014   Procedure: EXPLORATORY LAPAROTOMY, REPAIR OF PERFORATED ULCER;  Surgeon: Excell Seltzer, MD;  Location: [facility];  Service: General;  Laterality: N/A;  . LAPAROTOMY N/A 12/21/2015   Procedure: EXPLORATORY LAPAROTOMY/ REMOVAL OF MESH;  Surgeon: Coralie Keens, MD;  Location: [facility];  Service: General;  Laterality: N/A;  . ORIF WRIST FRACTURE  2012   left  . REVERSE SHOULDER ARTHROPLASTY Right 12/04/2013  . REVERSE SHOULDER ARTHROPLASTY  12/04/2013   Procedure: REVERSE SHOULDER ARTHROPLASTY;  Surgeon: Nita Sells, MD;  Location: [facility];  Service: Orthopedics;;  Right reverse total shoulder arthroplasty  . ROTATOR CUFF REPAIR     dr Tamera Punt  . SHOULDER ARTHROSCOPY  2012   lt and rt  . TENDON REPAIR Right 04/13/2014   Procedure: RIGHT HAND CENTRAL TENDON CENTRALIZATION OF LONG AND RING FINGERS;  Surgeon: Jolyn Nap, MD;  Location: [facility];  Service: Orthopedics;   Laterality: Right;   HPI:  84 yo female adm to [facility] from [place], found to have bilateral opacities of lungs concerning for ATX.  Pt was diagnosed with sepsis, also has h/o pna, GERD, CHF, gastric ulcer, chronic pain, ? if received too many pain medications.  Swallow eval ordered.  Pt esophageal h/o lower esophageal tortuous and large [facility] 12/20/2015.  Pt also had right parotiditis 01/21/2016.  Daughter in room feeding pt and stated pt does not have problems swallowing.     Assessment / Plan / Recommendation Clinical Impression  Pt presents with mild cognitive based oral dysphagia but no indications of aspiration with po observed. She does excessively masticate *only has front teeth* and has mild oral residuals without awareness.  Advised daughter to use puree to help clear small oral particules.  Liquid consumption via straw was timely.  Daughter states pt swishes and expectorates with water after meals.  Recommend continue diet with swallow precautions.   Educated daughter to findings of prior esophagram, partotiditis on right and reviewed precautions.  No SlP follow up.  SLP Visit Diagnosis: Dysphagia, oral phase (R13.11)    Aspiration Risk  Mild aspiration risk    Diet Recommendation Regular;Thin liquid   Liquid Administration via: Cup;Straw Medication Administration: Whole meds with liquid Supervision: Staff to assist with self feeding Compensations: Slow rate;Small sips/bites;Other (Comment);Minimize environmental distractions(clean mouth after meals, start meals with liquids) Postural Changes: Remain upright for at least 30 minutes after  po intake;Seated upright at 90 degrees    Other  Recommendations Oral Care Recommendations: Oral care before and after PO(after po)   Follow up Recommendations None      Frequency and Duration     n/a       Prognosis   n/a     Swallow Study   General Date of Onset: 02/25/18 HPI: 84 yo female adm to [facility] from [place], found to have bilateral opacities of  lungs concerning for ATX.  Pt was diagnosed with sepsis, also has h/o pna, GERD, CHF, gastric ulcer, chronic pain, ? if received too many pain medications.  Swallow eval ordered.  Pt esophageal h/o lower esophageal tortuous and large [facility] 12/20/2015.  Pt also had right parotiditis 01/21/2016.  Daughter in room feeding pt and stated pt does not have problems swallowing.   Type of Study: Bedside Swallow Evaluation Diet Prior to this Study: Regular;Thin liquids Temperature Spikes Noted: No Respiratory Status: Nasal cannula(4) History of Recent Intubation: No Behavior/Cognition: Alert;Doesn't follow directions Oral Cavity Assessment: Other (comment)(pt eating) Oral Care Completed by SLP: No Oral Cavity - Dentition: Adequate natural dentition Self-Feeding Abilities: Needs assist(dtr states pt has not fed herself since admit) Patient Positioning: Upright in bed Baseline Vocal Quality: Low vocal intensity Volitional Cough: Weak Volitional Swallow: Unable to elicit    Oral/Motor/Sensory Function Overall Oral Motor/Sensory Function: (pt did not follow directions, able to seal lips on straw, left eye difficult to open)   Ice Chips Ice chips: Not tested   Thin Liquid Thin Liquid: Within functional limits Presentation: Straw    Nectar Thick Nectar Thick Liquid: Not tested   Honey Thick Honey Thick Liquid: Not tested   Puree Puree: Within functional limits Presentation: Spoon   Solid     Solid: Impaired Oral Phase Functional Implications: Other (comment)(oral residuals) Other Comments: excessive mastication = mild oral residuals      Macario Golds 02/25/2018,12:57 PM Luanna Salk, [facility] SLP Acute Rehab Services Pager [phone] Office [phone]

## 2018-02-25 NOTE — Progress Notes (Signed)
Took over care of patient per daughters request. Daughter has no complaints at this time. Patient is stable and will continue to monitor.

## 2018-02-25 NOTE — Progress Notes (Signed)
Daily Progress Note   Patient Name: Monique Russo       Date: 02/25/2018 DOB: [birth year]  Age: 84 y.o. MRN#: [id number] Attending Physician: Monique August, MD Primary Care Physician: Monique Pretty, MD Admit Date: 02/21/2018  Reason for Consultation/Follow-up: Establishing goals of care  Subjective: I met today with Monique Russo and Monique Russo.  Patient again noted how much she feels I look like Monique husband and how desperately she misses him.  Monique Russo reports that she thinks that Monique condition is continuing to worsen.  She states that family has always been clear that goal is not to prolong life if she is dying, however, they also want to treat any treatable conditions if she has a chance to improve.  Length of Stay: 4  Current Medications: Scheduled Meds:  . diclofenac sodium  2 g Topical BID  . divalproex  500 mg Oral QHS  . DULoxetine  90 mg Oral QHS  . fluticasone  2 spray Each Nare QHS  . hydrocortisone cream   Topical BID  . loratadine  10 mg Oral Daily  . magnesium hydroxide  60 mL Oral QODAY  . methocarbamol  750 mg Oral BID  . multivitamin with minerals  1 tablet Oral Daily  . polyethylene glycol  17 g Oral BID  . rivaroxaban  20 mg Oral Daily  . timolol  1 drop Both Eyes BID  . torsemide  60 mg Oral BID    Continuous Infusions: . sodium chloride Stopped (02/21/18 2046)  . ceFEPime (MAXIPIME) IV 1 g (02/25/18 0541)    PRN Meds: sodium chloride, acetaminophen, bisacodyl, ondansetron **OR** ondansetron (ZOFRAN) IV, oxyCODONE  Physical Exam         General: Sleepy but arouses.  Chronically ill appearing.  HEENT: No bruits, no goiter, no JVD Heart: Regular rate and rhythm. No murmur appreciated. Lungs: Decreased.  Scattered crackles Abdomen: Soft,  nontender, nondistended, positive bowel sounds.  Ext: No significant edema Skin: Warm and dry  Vital Signs: BP (!) 102/58 (BP Location: Right Arm)   Pulse 73   Temp 99 F (37.2 C) (Oral)   Resp 19   Ht 5' 2.5" (1.588 m)   Wt 98.8 kg   SpO2 95%   BMI 39.20 kg/m  SpO2: SpO2: 95 % O2 Device: O2 Device: Nasal Cannula  O2 Flow Rate: O2 Flow Rate (L/min): 4 L/min  Intake/output summary:   Intake/Output Summary (Last 24 hours) at 02/25/2018 4174 Last data filed at 02/24/2018 1500 Gross per 24 hour  Intake 360 ml  Output -  Net 360 ml   LBM: Last BM Date: 02/24/18 Baseline Weight: Weight: 99.3 kg Most recent weight: Weight: 98.8 kg       Palliative Assessment/Data:    Flowsheet Rows     Most Recent Value  Intake Tab  Referral Department  Hospitalist  Unit at Time of Referral  Med/Surg Unit  Palliative Care Primary Diagnosis  Sepsis/Infectious Disease  Date Notified  02/22/18  Palliative Care Type  Return patient Palliative Care  Reason for referral  Clarify Goals of Care  Date of Admission  02/21/18  Date first seen by Palliative Care  02/23/18  # of days Palliative referral response time  1 Day(s)  # of days IP prior to Palliative referral  1  Clinical Assessment  Palliative Performance Scale Score  30%  Pain Max last 24 hours  Not able to report  Pain Min Last 24 hours  Not able to report  Psychosocial & Spiritual Assessment  Palliative Care Outcomes  Patient/Family meeting held?  Yes  Who was at the meeting?  Russo  Palliative Care Outcomes  Clarified goals of care      Patient Active Problem List   Diagnosis Date Noted  . Sepsis, Gram negative ([place]) 02/21/2018  . Pneumonia 02/21/2018  . Acute conjunctivitis, bilateral 01/15/2018  . Cellulitis of right thigh 09/15/2017  . Depression with anxiety 06/19/2017  . Hyperammonemia ([place]) 05/22/2017  . Acute bronchitis 04/09/2017  . Glaucoma 01/15/2017  . Seasonal allergic rhinitis 01/15/2017  . Deep vein  thrombosis (DVT) of femoral vein of both lower extremities ([place]) 10/20/2016  . Chronic bilateral deep vein thrombosis (DVT) of femoral veins ([place]) 10/14/2016  . Surgical wound, non healing, subsequent encounter 05/29/2016  . Insomnia 05/20/2016  . Constipation due to opioid therapy 04/08/2016  . Chronic narcotic use 02/15/2016  . GERD (gastroesophageal reflux disease) 02/15/2016  . Hypotension   . Protein-calorie malnutrition, severe ([place])   . Chronic bilateral low back pain without sciatica   . Pulmonary arterial hypertension ([place]) 11/28/2014  . Chronic pain syndrome   . CHF (congestive heart failure) ([place]) 07/03/2013    Palliative Care Assessment & Plan   Patient Profile: 84 y.o. female  with past medical history of chronic pain syndrome, history of PE on Xarelto, pulmonary hypertension, perforated gastric ulcer, diastolic dysfunction who has been living at long-term facility [facility]) admitted on 02/21/2018 with altered mental status likely secondary to pneumonia.  Palliative consulted for goals of care.  Recommendations/Plan: - DNR/DNI - Continue to treat potentially reversible conditions and plan to reassess tomorrow.  Monique Russo reports that family does not want to prolong suffering if we are at end of life, however, she states that patient has been in similar situation multiple times in the past, family has expressed desire to ensure she is comfortable, and then [name] has recovered.    Code Status:    Code Status Orders  (From admission, onward)         Start     Ordered   02/22/18 0207  Do not attempt resuscitation (DNR)  Continuous    Question Answer Comment  In the event of cardiac or respiratory ARREST Do not call a "code blue"   In the event of cardiac or respiratory ARREST Do  not perform Intubation, CPR, defibrillation or ACLS   In the event of cardiac or respiratory ARREST Use medication by any route, position, wound care, and other measures to relive  pain and suffering. May use oxygen, suction and manual treatment of airway obstruction as needed for comfort.      02/22/18 0208        Code Status History    Date Active Date Inactive Code Status Order ID Comments User Context   02/21/2018 2128 02/22/2018 0208 Full Code [id number]  Elwyn Reach, MD Inpatient   01/20/2016 2057 01/24/2016 2314 DNR [id number]  Edwin Dada, MD Inpatient   12/30/2015 1036 01/06/2016 1857 DNR [id number]  Earlie Counts, NP Inpatient   12/30/2015 1029 12/30/2015 1036 DNR [id number]  Earlie Counts, NP Inpatient   12/18/2015 1323 12/30/2015 1029 Full Code [id number]  Edwin Dada, MD Inpatient   08/04/2014 1948 08/12/2014 1547 Full Code [id number]  Excell Seltzer, MD Inpatient   07/17/2014 2102 07/20/2014 1358 DNR [id number]  Toy Baker, MD Inpatient   12/04/2013 1515 12/09/2013 1821 Full Code [id number]  Grier Mitts, PA-C Inpatient   07/04/2013 2342 07/10/2013 1917 DNR [id number]  Juluis Mire, MD Inpatient   07/04/2013 0103 07/04/2013 2342 Full Code [id number]  Orvan Falconer, MD Inpatient    Advance Directive Documentation     Most Recent Value  Type of Advance Directive  Out of facility DNR (pink MOST or yellow form)  Pre-existing out of facility DNR order (yellow form or pink MOST form)  Physician notified to receive inpatient order  "MOST" Form in Place?  -       Prognosis: Guarded  Discharge Planning:  To Be Determined  Care plan was discussed with patient, daughter, RN  Thank you for allowing the Palliative Medicine Team to assist in the care of this patient.   Total Time 20 Prolonged Time Billed no      Greater than 50%  of this time was spent counseling and coordinating care related to the above assessment and plan.  Micheline Rough, MD  Please contact Palliative Medicine Team phone at [phone] for questions and concerns.

## 2018-02-26 LAB — BASIC METABOLIC PANEL
Anion gap: 11 (ref 5–15)
BUN: 23 mg/dL (ref 8–23)
CHLORIDE: 90 mmol/L — AB (ref 98–111)
CO2: 37 mmol/L — ABNORMAL HIGH (ref 22–32)
CREATININE: 0.91 mg/dL (ref 0.44–1.00)
Calcium: 9.6 mg/dL (ref 8.9–10.3)
GFR calc Af Amer: >60 mL/min (ref >60)
GFR calc non Af Amer: 58 mL/min — ABNORMAL LOW (ref >60)
Glucose, Bld: 105 mg/dL — ABNORMAL HIGH (ref 70–99)
Potassium: 3.5 mmol/L (ref 3.5–5.1)
Sodium: 138 mmol/L (ref 135–145)

## 2018-02-26 LAB — CBC WITH DIFFERENTIAL/PLATELET
Abs Immature Granulocytes: 0.08 10*3/uL — ABNORMAL HIGH (ref 0.00–0.07)
Basophils Absolute: 0 10*3/uL (ref 0.0–0.1)
Basophils Relative: 0 %
Eosinophils Absolute: 0.2 10*3/uL (ref 0.0–0.5)
Eosinophils Relative: 2 %
HCT: 31.9 % — ABNORMAL LOW (ref 36.0–46.0)
Hemoglobin: 9.7 g/dL — ABNORMAL LOW (ref 12.0–15.0)
Immature Granulocytes: 1 %
LYMPHS PCT: 24 %
Lymphs Abs: 2 10*3/uL (ref 0.7–4.0)
MCH: 30.3 pg (ref 26.0–34.0)
MCHC: 30.4 g/dL (ref 30.0–36.0)
MCV: 99.7 fL (ref 80.0–100.0)
Monocytes Absolute: 1.2 10*3/uL — ABNORMAL HIGH (ref 0.1–1.0)
Monocytes Relative: 15 %
Neutro Abs: 4.9 10*3/uL (ref 1.7–7.7)
Neutrophils Relative %: 58 %
Platelets: 465 10*3/uL — ABNORMAL HIGH (ref 150–400)
RBC: 3.2 MIL/uL — ABNORMAL LOW (ref 3.87–5.11)
RDW: 14.8 % (ref 11.5–15.5)
WBC: 8.5 10*3/uL (ref 4.0–10.5)
nRBC: 0 % (ref 0.0–0.2)

## 2018-02-26 LAB — CULTURE, BLOOD (ROUTINE X 2)
Culture: NO GROWTH
Culture: NO GROWTH
Special Requests: ADEQUATE

## 2018-02-26 LAB — MAGNESIUM: Magnesium: 2.4 mg/dL (ref 1.7–2.4)

## 2018-02-26 MED ORDER — METHADONE HCL 5 MG PO TABS
5.0000 mg | ORAL_TABLET | Freq: Every day | ORAL | Status: DC
  Administered 2018-02-26: 5 mg via ORAL
  Filled 2018-02-26: qty 1

## 2018-02-26 NOTE — Progress Notes (Signed)
Daily Progress Note   Patient Name: Monique Russo       Date: 02/26/2018 DOB: Aug 09, 1930  Age: 84 y.o. MRN#: [id number] Attending Physician: Aline August, MD Primary Care Physician: [name], MD Admit Date: 02/21/2018  Reason for Consultation/Follow-up: Establishing goals of care  Subjective: I met today with Monique Russo.  She was sleepy and I discussed with her daughter and sister outside of the room.  We had a long discussion to review clinical course and possible pathways forward.   See below.  Length of Stay: 5  Current Medications: Scheduled Meds:  . diclofenac sodium  2 g Topical BID  . divalproex  500 mg Oral QHS  . DULoxetine  90 mg Oral QHS  . fluticasone  2 spray Each Nare QHS  . gabapentin  300 mg Oral BID  . hydrocortisone cream   Topical BID  . loratadine  10 mg Oral Daily  . magnesium hydroxide  60 mL Oral QODAY  . Melatonin  6 mg Oral QHS  . methadone  5 mg Oral Q12H  . methocarbamol  750 mg Oral BID  . multivitamin with minerals  1 tablet Oral Daily  . polyethylene glycol  17 g Oral BID  . rivaroxaban  20 mg Oral Daily  . timolol  1 drop Both Eyes BID  . torsemide  60 mg Oral BID    Continuous Infusions: . sodium chloride Stopped (02/21/18 2046)  . ceFEPime (MAXIPIME) IV 1 g (02/25/18 1806)    PRN Meds: sodium chloride, acetaminophen, bisacodyl, ondansetron **OR** ondansetron (ZOFRAN) IV, oxyCODONE  Physical Exam         General: Sleepy but arouses.  Chronically ill appearing.  HEENT: No bruits, no goiter, no JVD Heart: Regular rate and rhythm. No murmur appreciated. Lungs: Decreased.  Scattered crackles Abdomen: Soft, nontender, nondistended, positive bowel sounds.  Ext: No significant edema Skin: Warm and dry  Vital Signs: BP 110/60  (BP Location: Right Arm)   Pulse 97   Temp 99.1 F (37.3 C) (Oral)   Resp 18   Ht 5' 2.5" (1.588 m)   Wt 98.8 kg   SpO2 96%   BMI 39.20 kg/m  SpO2: SpO2: 96 % O2 Device: O2 Device: Nasal Cannula O2 Flow Rate: O2 Flow Rate (L/min): 4 L/min  Intake/output summary:   Intake/Output  Summary (Last 24 hours) at 02/26/2018 0017 Last data filed at 02/25/2018 2200 Gross per 24 hour  Intake 340 ml  Output -  Net 340 ml   LBM: Last BM Date: 02/25/18 Baseline Weight: Weight: 99.3 kg Most recent weight: Weight: 98.8 kg       Palliative Assessment/Data:    Flowsheet Rows     Most Recent Value  Intake Tab  Referral Department  Hospitalist  Unit at Time of Referral  Med/Surg Unit  Palliative Care Primary Diagnosis  Sepsis/Infectious Disease  Date Notified  02/22/18  Palliative Care Type  Return patient Palliative Care  Reason for referral  Clarify Goals of Care  Date of Admission  02/21/18  Date first seen by Palliative Care  02/23/18  # of days Palliative referral response time  1 Day(s)  # of days IP prior to Palliative referral  1  Clinical Assessment  Palliative Performance Scale Score  30%  Pain Max last 24 hours  Not able to report  Pain Min Last 24 hours  Not able to report  Psychosocial & Spiritual Assessment  Palliative Care Outcomes  Patient/Family meeting held?  Yes  Who was at the meeting?  Daughter  Palliative Care Outcomes  Clarified goals of care      Patient Active Problem List   Diagnosis Date Noted  . Sepsis, Gram negative ([place]) 02/21/2018  . Pneumonia 02/21/2018  . Acute conjunctivitis, bilateral 01/15/2018  . Cellulitis of right thigh 09/15/2017  . Depression with anxiety 06/19/2017  . Hyperammonemia ([place]) 05/22/2017  . Acute bronchitis 04/09/2017  . Glaucoma 01/15/2017  . Seasonal allergic rhinitis 01/15/2017  . Deep vein thrombosis (DVT) of femoral vein of both lower extremities ([place]) 10/20/2016  . Chronic bilateral deep vein thrombosis (DVT)  of femoral veins ([place]) 10/14/2016  . Surgical wound, non healing, subsequent encounter 05/29/2016  . Insomnia 05/20/2016  . Constipation due to opioid therapy 04/08/2016  . Chronic narcotic use 02/15/2016  . GERD (gastroesophageal reflux disease) 02/15/2016  . Hypotension   . Protein-calorie malnutrition, severe ([place])   . Chronic bilateral low back pain without sciatica   . Pulmonary arterial hypertension ([place]) 11/28/2014  . Chronic pain syndrome   . CHF (congestive heart failure) ([place]) 07/03/2013    Palliative Care Assessment & Plan   Patient Profile: 84 y.o. female  with past medical history of chronic pain syndrome, history of PE on Xarelto, pulmonary hypertension, perforated gastric ulcer, diastolic dysfunction who has been living at long-term facility [facility]) admitted on 02/21/2018 with altered mental status likely secondary to pneumonia.  Palliative consulted for goals of care.  Recommendations/Plan: - DNR/DNI - Continue current interventions and treatment of this infection.  Once medically stable, plan to transition back to SNF with hospice support.  If she worsens, family would be interested in residential hospice at the end of this hospitalization.   Code Status:    Code Status Orders  (From admission, onward)         Start     Ordered   02/22/18 0207  Do not attempt resuscitation (DNR)  Continuous    Question Answer Comment  In the event of cardiac or respiratory ARREST Do not call a "code blue"   In the event of cardiac or respiratory ARREST Do not perform Intubation, CPR, defibrillation or ACLS   In the event of cardiac or respiratory ARREST Use medication by any route, position, wound care, and other measures to relive pain and suffering. May use oxygen, suction and  manual treatment of airway obstruction as needed for comfort.      02/22/18 0208        Code Status History    Date Active Date Inactive Code Status Order ID Comments User Context    02/21/2018 2128 02/22/2018 0208 Full Code [id number]  Elwyn Reach, MD Inpatient   01/20/2016 2057 01/24/2016 2314 DNR [id number]  Edwin Dada, MD Inpatient   12/30/2015 1036 01/06/2016 1857 DNR [id number]  Earlie Counts, NP Inpatient   12/30/2015 1029 12/30/2015 1036 DNR [id number]  Earlie Counts, NP Inpatient   12/18/2015 1323 12/30/2015 1029 Full Code [id number]  Edwin Dada, MD Inpatient   08/04/2014 1948 08/12/2014 1547 Full Code [id number]  Excell Seltzer, MD Inpatient   07/17/2014 2102 07/20/2014 1358 DNR [id number]  Toy Baker, MD Inpatient   12/04/2013 1515 12/09/2013 1821 Full Code [id number]  Grier Mitts, PA-C Inpatient   07/04/2013 2342 07/10/2013 1917 DNR [id number]  Juluis Mire, MD Inpatient   07/04/2013 0103 07/04/2013 2342 Full Code [id number]  Orvan Falconer, MD Inpatient    Advance Directive Documentation     Most Recent Value  Type of Advance Directive  Out of facility DNR (pink MOST or yellow form)  Pre-existing out of facility DNR order (yellow form or pink MOST form)  Physician notified to receive inpatient order  "MOST" Form in Place?  -       Prognosis: Guarded  Discharge Planning:  To Be Determined  Care plan was discussed with patient, daughter, RN  Thank you for allowing the Palliative Medicine Team to assist in the care of this patient.   Total Time 50 Prolonged Time Billed no      Greater than 50%  of this time was spent counseling and coordinating care related to the above assessment and plan.  Micheline Rough, MD  Please contact Palliative Medicine Team phone at [phone] for questions and concerns.

## 2018-02-26 NOTE — Progress Notes (Signed)
Patient ID: Monique Russo, female   DOB: May 29, 1930, 84 y.o.   MRN: [id number]  PROGRESS NOTE    Monique Russo  CXK:[id number] DOB: 06/24/1930 DOA: 02/21/2018 PCP: [name], MD   Brief Narrative:  84 year old female with history of chronic pain syndrome, PE on Xarelto, pulmonary hypertension, prior pneumonia, perforated gastric ulcer and chronic diastolic CHF presented from skilled nursing facility with fatigue and fever with altered mental status.  Chest x-ray was showing pulmonary edema and bilateral pleural effusions and patchy lower lobe opacities.  She was started on IV fluids and antibiotics.  Assessment & Plan:   Principal Problem:   Sepsis, Gram negative ([place]) Active Problems:   Chronic pain syndrome   GERD (gastroesophageal reflux disease)   Depression with anxiety   Pneumonia   Sepsis probably from pneumonia -Continue antibiotics.  Cultures negative so far.  Hemodynamically stable currently  Acute hypoxic respiratory failure probably secondary to pneumonia -Present on admission -Wean off as able.  Still requiring 4 L oxygen via nasal cannula.  Incentive spirometry if patient is awake.  Probable healthcare associated pneumonia with concern for gram-negative versus MRSA pneumonia -Currently on cefepime, finish 7-day course of therapy tomorrow.  Off vancomycin.  Cultures negative so far -Rapid influenza negative -Diet as per SLP evaluation  Leukocytosis -Probably secondary to above.  Improved.  Monitor  Acute metabolic encephalopathy -Most likely from pneumonia in a patient with history of chronic pain syndrome and chronic narcotic use. -Mental status has not improved much.   -Patient was slightly more awake yesterday morning and was intermittently moaning and hence was put back on her methadone/melatonin.  This morning, patient is very drowsy and sleepy.  Daughter is concerned that the patient is almost unconscious.  Daughter had discussion with palliative  care/Dr. Domingo Cocking yesterday and for now, wants to complete antibiotic therapy and then subsequently go to SNF with hospice services or if condition worsens, consider residential hospice.  I explained to the daughter that giving long-acting pain medications would make her comfortable as well as more sleepy.  I recommended that patient would benefit from hospice rather than doing aggressive measures.  For now, discontinue melatonin and decrease methadone to 5 mg daily at bedtime.  If patient becomes more agitated with pain, we can certainly put her back on her current regimen of 5 mg twice a day of methadone. -Palliative care following.  Hypoalbuminemia -Probably secondary  to poor oral intake.  Follow nutrition recommendations  Chronic diastolic heart failure -Strict input and output.  Current urine output has not been recorded properly.  Daily weights.  Continue torsemide.  Repeat chest x-ray on 02/24/2018 showed persistent bibasilar atelectasis versus infiltrates  Chronic pain syndrome -Plan for narcotics as above. Daughter at bedside is aware.  History of DVT and PE -continue Xarelto  Obesity -Outpatient follow-up  DVT prophylaxis: Xarelto Code Status: DNR Family Communication: Spoke to daughter at bedside Disposition Plan: Probable nursing home in the next 1 or 2 days with hospice services.  Consultants: Palliative care  Procedures: None  Antimicrobials: Cefepime from 02/21/2018 onwards Vancomycin 02/21/2018-02/23/2018 Flagyl 02/21/2018-02/22/2018   Subjective: Patient seen and examined at bedside.  Patient is less awake, hardly opens her eyes.  Does not answer any questions.  No overnight fever, vomiting or worsening shortness of breath reported.  Objective: Vitals:   02/25/18 2158 02/25/18 2239 02/26/18 0500 02/26/18 0547  BP:  110/60  110/64  Pulse: 97   84  Resp: 18   20  Temp: 99.1 F (  37.3 C)   99.6 F (37.6 C)  TempSrc: Oral   Oral  SpO2: 96%   92%  Weight:   97.3 kg     Height:        Intake/Output Summary (Last 24 hours) at 02/26/2018 1028 Last data filed at 02/26/2018 0600 Gross per 24 hour  Intake 531.11 ml  Output -  Net 531.11 ml   Filed Weights   02/24/18 0500 02/25/18 0547 02/26/18 0500  Weight: 99.2 kg 98.8 kg 97.3 kg    Examination:  General exam: Elderly female lying in bed, less awake, hardly answers any questions.  Ill looking.  No distress  espiratory system: Bilateral decreased breath sounds at bases with scattered crackles, no wheezing Cardiovascular system: Rate controlled, S1-S2 heard Gastrointestinal system: Abdomen is obese, nondistended, soft and nontender. Normal bowel sounds heard. Extremities: No cyanosis; pitting edema present   Data Reviewed: I have personally reviewed following labs and imaging studies  CBC: Recent Labs  Lab 02/21/18 1655 02/22/18 0530 02/23/18 0443 02/24/18 0525 02/25/18 0459 02/26/18 0748  WBC 16.8* 13.0* 12.1* 10.8* 9.4 8.5  NEUTROABS 13.2*  --  8.9* 7.5 5.9 4.9  HGB 10.5* 9.4* 9.5* 9.5* 9.7* 9.7*  HCT 33.3* 30.6* 30.6* 31.1* 31.6* 31.9*  MCV 99.7 100.3* 101.0* 100.0 100.3* 99.7  PLT 445* 379 383 435* 421* 712*   Basic Metabolic Panel: Recent Labs  Lab 02/22/18 0530 02/23/18 0443 02/24/18 0525 02/25/18 0459 02/26/18 0748  NA 138 142 141 140 138  K 3.4* 3.2* 4.2 3.5 3.5  CL 98 97* 98 94* 90*  CO2 31 34* 34* 38* 37*  GLUCOSE 103* 94 93 102* 105*  BUN 13 13 18 20 23   CREATININE 0.67 0.83 0.95 0.85 0.91  CALCIUM 8.6* 9.1 9.4 9.6 9.6  MG  --  2.1 2.2 2.3 2.4   GFR: Estimated Creatinine Clearance: 49.7 mL/min (by C-G formula based on SCr of 0.91 mg/dL). Liver Function Tests: Recent Labs  Lab 02/21/18 1655 02/22/18 0530 02/23/18 0443 02/25/18 0459  AST 12* 10* 11* 11*  ALT 9 7 6 7   ALKPHOS 109 115 81 76  BILITOT 0.8 0.8 0.7 0.4  PROT 7.3 6.4* 6.5 6.9  ALBUMIN 2.1* 2.0* 1.9* 2.1*   No results for input(s): LIPASE, AMYLASE in the last 168 hours. No results for  input(s): AMMONIA in the last 168 hours. Coagulation Profile: Recent Labs  Lab 02/21/18 1655  INR 2.34   Cardiac Enzymes: No results for input(s): CKTOTAL, CKMB, CKMBINDEX, TROPONINI in the last 168 hours. BNP (last 3 results) No results for input(s): PROBNP in the last 8760 hours. HbA1C: No results for input(s): HGBA1C in the last 72 hours. CBG: No results for input(s): GLUCAP in the last 168 hours. Lipid Profile: No results for input(s): CHOL, HDL, LDLCALC, TRIG, CHOLHDL, LDLDIRECT in the last 72 hours. Thyroid Function Tests: No results for input(s): TSH, T4TOTAL, FREET4, T3FREE, THYROIDAB in the last 72 hours. Anemia Panel: No results for input(s): VITAMINB12, FOLATE, FERRITIN, TIBC, IRON, RETICCTPCT in the last 72 hours. Sepsis Labs: Recent Labs  Lab 02/21/18 1657  LATICACIDVEN 1.03    Recent Results (from the past 240 hour(s))  Culture, blood (Routine x 2)     Status: None (Preliminary result)   Collection Time: 02/21/18  4:56 PM  Result Value Ref Range Status   Specimen Description   Final    BLOOD RIGHT ANTECUBITAL Performed at [facility] [address] [place], [place] [address]  Special Requests   Final    BOTTLES DRAWN AEROBIC AND ANAEROBIC Blood Culture adequate volume Performed at [facility] [address], [place], [place] [address]    Culture   Final    NO GROWTH 4 DAYS Performed at [facility], [place] [address], [place], [place] [address]    Report Status PENDING  Incomplete  Culture, blood (Routine x 2)     Status: None (Preliminary result)   Collection Time: 02/21/18  4:57 PM  Result Value Ref Range Status   Specimen Description   Final    BLOOD LEFT ANTECUBITAL Performed at [facility] [address], [place], [place] [address]    Special Requests   Final    BOTTLES DRAWN AEROBIC AND ANAEROBIC Blood Culture results may not be optimal due to an excessive volume of blood  received in culture bottles Performed at [facility] [address], [place], [place] [address]    Culture   Final    NO GROWTH 4 DAYS Performed at [facility], [place] [address], [place], [place] [address]    Report Status PENDING  Incomplete  MRSA PCR Screening     Status: None   Collection Time: 02/22/18  5:39 AM  Result Value Ref Range Status   MRSA by PCR NEGATIVE NEGATIVE Final    Comment:        The GeneXpert MRSA Assay (FDA approved for NASAL specimens only), is one component of a comprehensive MRSA colonization surveillance program. It is not intended to diagnose MRSA infection nor to guide or monitor treatment for MRSA infections. Performed at [facility], [place] [address], [place], [place] [address]          Radiology Studies: Dg Chest Port 1 View  Result Date: 02/24/2018 CLINICAL DATA:  Abnormal breath sounds.  Shortness of breath. EXAM: PORTABLE CHEST 1 VIEW COMPARISON:  02/21/2018 FINDINGS: The heart is mildly enlarged but stable. Stable tortuosity and calcification of the thoracic aorta. Bibasilar opacity suspicious for infiltrates or atelectasis. No definite pleural effusions. IMPRESSION: Persistent bibasilar atelectasis or infiltrates. Electronically Signed   By: Marijo Sanes M.D.   On: 02/24/2018 11:00        Scheduled Meds: . diclofenac sodium  2 g Topical BID  . divalproex  500 mg Oral QHS  . DULoxetine  90 mg Oral QHS  . fluticasone  2 spray Each Nare QHS  . gabapentin  300 mg Oral BID  . hydrocortisone cream   Topical BID  . loratadine  10 mg Oral Daily  . magnesium hydroxide  60 mL Oral QODAY  . methadone  5 mg Oral QHS  . methocarbamol  750 mg Oral BID  . multivitamin with minerals  1 tablet Oral Daily  . polyethylene glycol  17 g Oral BID  . rivaroxaban  20 mg Oral Daily  . timolol  1 drop Both Eyes BID  . torsemide  60 mg Oral BID   Continuous Infusions: . sodium chloride Stopped (02/21/18  2046)  . ceFEPime (MAXIPIME) IV 1 g (02/26/18 0541)     LOS: 5 days        Aline August, MD [facility] Pager [phone]  If 7PM-7AM, please contact night-coverage [email or web address] Password [email or web address] 02/26/2018, 10:28 AM

## 2018-02-26 NOTE — Progress Notes (Signed)
Patients torsemide held this evening per [facility] instructions.  BP readings 98-100 SBP this afternoon.  Have been checking BP in lower arms as patient does not tolerate the pressure in her upper arms.  Attempted to obtain BP in upper arm again this evening, but patient began yelling in pain and swinging arms.  MD made aware, ok to hold this evening.

## 2018-02-26 NOTE — Care Management Important Message (Signed)
Important Message  Patient Details  Name: Monique Russo MRN: [id number] Date of Birth: 1930/06/04   Medicare Important Message Given:  Yes    Kerin Salen 02/26/2018, 12:18 [place] Message  Patient Details  Name: Monique Russo MRN: [id number] Date of Birth: [birth year]   Medicare Important Message Given:  Yes    Kerin Salen 02/26/2018, 12:18 PM

## 2018-02-26 NOTE — Clinical Social Work Note (Signed)
Clinical Social Work Assessment  Patient Details  Name: Rudie Rikard MRN: [id number] Date of Birth: [birth year]  Date of referral:  02/26/18               Reason for consult:  Discharge Planning                Permission sought to share information with:  Family Supports Permission granted to share information::     Name::         Agency::   [facility]  Relationship::  Daughters  Contact Information:     Housing/Transportation Living arrangements for the past 2 months:  [place] of Information:  Adult Children Patient Interpreter Needed:  None Criminal Activity/Legal Involvement Pertinent to Current Situation/Hospitalization:  No - Comment as needed Significant Relationships:  Adult Children Lives with:  Facility Resident Do you feel safe going back to the place where you live?  Yes Need for family participation in patient care:  Yes (Comment)  Care giving concerns:   Patient admitted from SNF for Fatigue and fever with altered mental status.  Social Worker assessment / plan:  The patient is a long term care resident at [facility]. The patient is bed bound and requires total care. The patient daughter reports she is concern about the patient outcome after this hospitalization. She reports the patient has been admitted to hospital several times over the year and continues to decline in health. Patient family discussed a care plan with the palliative team and request the patient go back to the nursing with hospice services following. CSW informed the facility representative.  Patient daughter express concern about the patient care at [facility]. CSW passed this information along to the facility liaison to share with the care team.   FL2 complete.  Plan: SNF   Employment status:  Retired Forensic scientist:  Other (Comment Required)([employer] ) PT Recommendations:  Not assessed at this time Information / Referral to community resources:   [place]  Patient/Family's Response to care:  Agreeable to care.   Patient/Family's Understanding of and Emotional Response to Diagnosis, Current Treatment, and Prognosis: Patient only alert to self. Patient daughter is learning about patient care and has discussed her long term plan of care with the palliative team.   Emotional Assessment Appearance:  Appears younger than stated age Attitude/Demeanor/Rapport:    Affect (typically observed):  Unable to Assess Orientation:  Oriented to Self Alcohol / Substance use:  Not Applicable Psych involvement (Current and /or in the community):  No (Comment)  Discharge Needs  Concerns to be addressed:  Discharge Planning Concerns Readmission within the last 30 days:  No Current discharge risk:  Dependent with Mobility Barriers to Discharge:      Lia Hopping, LCSW 02/26/2018, 3:22 PM

## 2018-02-26 NOTE — NC FL2 (Addendum)
[place] LEVEL OF CARE SCREENING TOOL     IDENTIFICATION  Patient Name: Monique Russo Birthdate: Jan 29, 1931 Sex: female Admission Date (Current Location): 02/21/2018  [facility] and [place] Number:  Herbalist and Address:  [facility],  [place] [address], [place]      Provider Number: [id number]  Attending Physician Name and Address:  Aline August, MD  Relative Name and Phone Number:       Current Level of Care: Hospital Recommended Level of Care: [place] Prior Approval Number:    Date Approved/Denied:   PASRR Number:    Discharge Plan: SNF    Current Diagnoses: Patient Active Problem List   Diagnosis Date Noted  . Sepsis, Gram negative ([place]) 02/21/2018  . Pneumonia 02/21/2018  . Acute conjunctivitis, bilateral 01/15/2018  . Cellulitis of right thigh 09/15/2017  . Depression with anxiety 06/19/2017  . Hyperammonemia ([place]) 05/22/2017  . Acute bronchitis 04/09/2017  . Glaucoma 01/15/2017  . Seasonal allergic rhinitis 01/15/2017  . Deep vein thrombosis (DVT) of femoral vein of both lower extremities ([place]) 10/20/2016  . Chronic bilateral deep vein thrombosis (DVT) of femoral veins ([place]) 10/14/2016  . Surgical wound, non healing, subsequent encounter 05/29/2016  . Insomnia 05/20/2016  . Constipation due to opioid therapy 04/08/2016  . Chronic narcotic use 02/15/2016  . GERD (gastroesophageal reflux disease) 02/15/2016  . Hypotension   . Protein-calorie malnutrition, severe ([place])   . Chronic bilateral low back pain without sciatica   . Pulmonary arterial hypertension ([place]) 11/28/2014  . Chronic pain syndrome   . CHF (congestive heart failure) ([place]) 07/03/2013    Orientation RESPIRATION BLADDER Height & Weight     Self  O2 Incontinent Weight: 214 lb 8.1 oz (97.3 kg) Height:  5' 2.5" (158.8 cm)  BEHAVIORAL SYMPTOMS/MOOD NEUROLOGICAL BOWEL NUTRITION STATUS      Incontinent (See discharge summary )   AMBULATORY STATUS COMMUNICATION OF NEEDS Skin   Total Care Verbally Other (Comment)(Incision Abdomen )                       Personal Care Assistance Level of Assistance  Bathing, Feeding, Dressing, Total care Bathing Assistance: Maximum assistance Feeding assistance: Limited assistance Dressing Assistance: Maximum assistance     Functional Limitations Info  Sight, Hearing, Speech Sight Info: Adequate Hearing Info: Adequate Speech Info: Adequate    SPECIAL CARE FACTORS FREQUENCY  PT (By licensed PT), OT (By licensed OT)     PT Frequency: 5X/WEEK OT Frequency: 5X/WEEK            Contractures Contractures Info: Not present    Additional Factors Info  Code Status, Allergies, Psychotropic Code Status Info: DNF  Allergies Info: Allergies: Tape  Psychotropic Info: Depakote, Cymbalta          Current Medications (02/26/2018):  This is the current hospital active medication list Current Facility-Administered Medications  Medication Dose Route Frequency Provider Last Rate Last Dose  . 0.9 %  sodium chloride infusion   Intravenous PRN Long, Wonda Olds, MD   Stopped at 02/21/18 2046  . acetaminophen (TYLENOL) tablet 325 mg  325 mg Oral Q6H PRN Elwyn Reach, MD   325 mg at 02/26/18 1346  . bisacodyl (DULCOLAX) suppository 10 mg  10 mg Rectal Daily PRN Gala Romney L, MD      . ceFEPIme (MAXIPIME) 1 g in sodium chloride 0.9 % 100 mL IVPB  1 g Intravenous Q12H Wofford, Cindie Laroche,  RPH 200 mL/hr at 02/26/18 0541 1 g at 02/26/18 0541  . diclofenac sodium (VOLTAREN) 1 % transdermal gel 2 g  2 g Topical BID Gala Romney L, MD   2 g at 02/26/18 1008  . divalproex (DEPAKOTE SPRINKLE) capsule 500 mg  500 mg Oral QHS Gala Romney L, MD   500 mg at 02/25/18 2158  . DULoxetine (CYMBALTA) DR capsule 90 mg  90 mg Oral QHS Elwyn Reach, MD   90 mg at 02/25/18 2204  . fluticasone (FLONASE) 50 MCG/ACT nasal spray 2 spray  2 spray Each Nare QHS Elwyn Reach, MD   2 spray  at 02/25/18 2213  . gabapentin (NEURONTIN) capsule 300 mg  300 mg Oral BID Aline August, MD   300 mg at 02/26/18 0957  . hydrocortisone cream 1 %   Topical BID Gala Romney L, MD      . loratadine (CLARITIN) tablet 10 mg  10 mg Oral Daily Elwyn Reach, MD   10 mg at 02/26/18 0957  . magnesium hydroxide (MILK OF MAGNESIA) suspension 60 mL  60 mL Oral QODAY Garba, Mohammad L, MD   60 mL at 02/26/18 1008  . methadone (DOLOPHINE) tablet 5 mg  5 mg Oral QHS Alekh, Kshitiz, MD      . methocarbamol (ROBAXIN) tablet 750 mg  750 mg Oral BID Gala Romney L, MD   750 mg at 02/26/18 0956  . multivitamin with minerals tablet 1 tablet  1 tablet Oral Daily Elwyn Reach, MD   1 tablet at 02/26/18 0957  . ondansetron (ZOFRAN) tablet 4 mg  4 mg Oral Q6H PRN Elwyn Reach, MD       Or  . ondansetron (ZOFRAN) injection 4 mg  4 mg Intravenous Q6H PRN Gala Romney L, MD      . oxyCODONE (Oxy IR/ROXICODONE) immediate release tablet 5 mg  5 mg Oral Q4H PRN Elwyn Reach, MD   5 mg at 02/26/18 0546  . polyethylene glycol (MIRALAX / GLYCOLAX) packet 17 g  17 g Oral BID Elwyn Reach, MD   17 g at 02/26/18 0956  . rivaroxaban (XARELTO) tablet 20 mg  20 mg Oral Daily Elwyn Reach, MD   20 mg at 02/26/18 0957  . timolol (TIMOPTIC) 0.5 % ophthalmic solution 1 drop  1 drop Both Eyes BID Elwyn Reach, MD   1 drop at 02/26/18 1008  . torsemide (DEMADEX) tablet 60 mg  60 mg Oral BID Aline August, MD   60 mg at 02/26/18 6270     Discharge Medications: Please see discharge summary for a list of discharge medications.  Relevant Imaging Results:  Relevant Lab Results:   Additional Information ss#: [id number]  **Hospice Services to be arranged at SNF.   Lia Hopping, LCSW

## 2018-02-27 LAB — CBC WITH DIFFERENTIAL/PLATELET
Abs Immature Granulocytes: 0.11 10*3/uL — ABNORMAL HIGH (ref 0.00–0.07)
Basophils Absolute: 0 10*3/uL (ref 0.0–0.1)
Basophils Relative: 1 %
Eosinophils Absolute: 0.2 10*3/uL (ref 0.0–0.5)
Eosinophils Relative: 3 %
HCT: 36.6 % (ref 36.0–46.0)
Hemoglobin: 11 g/dL — ABNORMAL LOW (ref 12.0–15.0)
Immature Granulocytes: 1 %
Lymphocytes Relative: 25 %
Lymphs Abs: 2.1 10*3/uL (ref 0.7–4.0)
MCH: 30.7 pg (ref 26.0–34.0)
MCHC: 30.1 g/dL (ref 30.0–36.0)
MCV: 102.2 fL — ABNORMAL HIGH (ref 80.0–100.0)
Monocytes Absolute: 1.2 10*3/uL — ABNORMAL HIGH (ref 0.1–1.0)
Monocytes Relative: 15 %
Neutro Abs: 4.6 10*3/uL (ref 1.7–7.7)
Neutrophils Relative %: 55 %
Platelets: 431 10*3/uL — ABNORMAL HIGH (ref 150–400)
RBC: 3.58 MIL/uL — ABNORMAL LOW (ref 3.87–5.11)
RDW: 14.9 % (ref 11.5–15.5)
WBC: 8.2 10*3/uL (ref 4.0–10.5)
nRBC: 0 % (ref 0.0–0.2)

## 2018-02-27 LAB — BASIC METABOLIC PANEL
Anion gap: 10 (ref 5–15)
BUN: 29 mg/dL — ABNORMAL HIGH (ref 8–23)
CO2: 41 mmol/L — ABNORMAL HIGH (ref 22–32)
Calcium: 10.1 mg/dL (ref 8.9–10.3)
Chloride: 89 mmol/L — ABNORMAL LOW (ref 98–111)
Creatinine, Ser: 0.99 mg/dL (ref 0.44–1.00)
GFR calc Af Amer: >60 mL/min (ref >60)
GFR calc non Af Amer: 52 mL/min — ABNORMAL LOW (ref >60)
Glucose, Bld: 103 mg/dL — ABNORMAL HIGH (ref 70–99)
POTASSIUM: 3.7 mmol/L (ref 3.5–5.1)
Sodium: 140 mmol/L (ref 135–145)

## 2018-02-27 LAB — MAGNESIUM: Magnesium: 2.9 mg/dL — ABNORMAL HIGH (ref 1.7–2.4)

## 2018-02-27 MED ORDER — METHADONE HCL 5 MG PO TABS: 5.0000 mg | ORAL_TABLET | Freq: Two times a day (BID) | ORAL | 0 refills | Status: AC

## 2018-02-27 MED ORDER — OXYCODONE HCL 5 MG PO TABS: 5.0000 mg | ORAL_TABLET | ORAL | 0 refills | Status: AC | PRN

## 2018-02-27 MED ORDER — TEMAZEPAM 30 MG PO CAPS: 30.0000 mg | ORAL_CAPSULE | Freq: Every evening | ORAL | 0 refills | Status: AC | PRN

## 2018-02-27 MED ORDER — ONE-DAILY MULTI VITAMINS PO TABS: 1.0000 | ORAL_TABLET | Freq: Every day | ORAL | Status: AC

## 2018-02-27 MED ORDER — POLYETHYLENE GLYCOL 3350 17 G PO PACK: 17.0000 g | PACK | Freq: Every day | ORAL | 0 refills | Status: AC

## 2018-02-27 NOTE — Consult Note (Signed)
   [facility] CM Inpatient Consult   02/27/2018  Ragna Kramlich Oct 31, 1930 [id number]    Patient screened for potential [facility] Care Management program due to unplanned readmission risk score of 17% (medium).   Chart reviewed. Noted patient is a long term resident of [facility]. There are no identifiable [facility] Care Management needs at this time.   Marthenia Rolling, MSN-Ed, RN,BSN [facility] Liaison [phone]

## 2018-02-27 NOTE — Discharge Summary (Signed)
Physician Discharge Summary  Monique Russo LGX:[id number] DOB: Sep 25, 1930 DOA: 02/21/2018  PCP: [name], MD  Admit date: 02/21/2018 Discharge date: 02/27/2018  Admitted From: SNF Disposition:  SNF  Recommendations for Outpatient Follow-up:  1. Follow up with SNF provider at earliest convenience 2. Follow-up with [facility] at earliest convenience.  Hospice options were discussed with the family.    Home Health: No  equipment/Devices: None  discharge Condition: Poor CODE STATUS: DNR Diet recommendation: Heart Healthy /fluid restriction up to 1200 cc a day  Brief/Interim Summary: 84 year old female with history of chronic pain syndrome, PE on Xarelto, pulmonary hypertension, prior pneumonia, perforated gastric ulcer and chronic diastolic CHF presented from skilled nursing facility with fatigue and fever with altered mental status.  Chest x-ray was showing pulmonary edema and bilateral pleural effusions and patchy lower lobe opacities.  She was started on IV fluids and antibiotics.  Her long-acting pain medications including methadone and other sedatives were held initially because of encephalopathy.  After multiple discussions with patient's daughter, methadone was restarted but patient became more drowsy.  Methadone was changed to once a day at night.  Patient had more pain afterwards.  She will be switched back to her current regimen of 5 milligrams twice a day.  Cultures have been negative so far.  Currently she is on cefepime, today's day #7.  No need for further antibiotics.  Diet as per SLP recommendations.  Palliative care has also been involved.  After multiple discussions, it was decided that patient will be discharged to SNF with hospice services.  Discharge to SNF today.   Discharge Diagnoses:  Principal Problem:   Sepsis, Gram negative ([place]) Active Problems:   Chronic pain syndrome   GERD (gastroesophageal reflux disease)   Depression with anxiety    Pneumonia  Sepsis probably from pneumonia -Sepsis has resolved. Cultures negative so far.  Hemodynamically stable currently  Acute hypoxic respiratory failure probably secondary to pneumonia -Present on admission -Wean off as able.  Still requiring 4 L oxygen via nasal cannula.  Incentive spirometry if patient is awake.  Probable healthcare associated pneumonia with concern for gram-negative versus MRSA pneumonia -Currently on cefepime, today's day #7 of antibiotics.  Discontinue antibiotics afterwards. Off vancomycin.  Cultures negative so far -Rapid influenza negative -Diet as per SLP evaluation  Leukocytosis -Probably secondary to above.  Improved.    Acute metabolic encephalopathy -Most likely from pneumonia in a patient with history of chronic pain syndrome and chronic narcotic use. -Mental status has not improved much.   -Her long-acting pain medications including methadone and other sedatives were held initially because of encephalopathy.  After multiple discussions with patient's daughter, methadone was restarted but patient became more drowsy.  Methadone was changed to once a day at night.  Patient had more pain afterwards.  She will be switched back to her current regimen of 5 milligrams twice a day.  This might make her more drowsy but she will not be in any distress.  Palliative care has also been involved.  After multiple discussions, it was decided that patient will be discharged to SNF with hospice services.  -Hospice services were discussed with the patient's daughter.  Please follow-up with [facility] at earliest convenience.  Melatonin was discontinued for now. -Overall prognosis very poor.  Hypoalbuminemia -Probably secondary  to poor oral intake.  Follow nutrition recommendations  Chronic diastolic heart failure -Strict input and output.  Current urine output has not been recorded properly.  Daily  weights.  Continue torsemide.  Repeat chest x-ray on  02/24/2018 showed persistent bibasilar atelectasis versus infiltrates.  Chronic pain syndrome -Plan for narcotics as above. Daughter at bedside is aware.  History of DVT and PE -continue Xarelto  Obesity -Outpatient follow-up  Discharge Instructions  Discharge Instructions    Diet - low sodium heart healthy   Complete by:  As directed    Increase activity slowly   Complete by:  As directed      Allergies as of 02/27/2018      Reactions   Tape Other (See Comments)   SKIN IS VERY THIN; IT BRUISES AND TEARS EASILY!! -THX      Medication List    STOP taking these medications   Melatonin 3 MG Tabs     TAKE these medications   acetaminophen 325 MG tablet Commonly known as:  TYLENOL Take 325 mg by mouth every 6 (six) hours as needed for mild pain.   bisacodyl 10 MG suppository Commonly known as:  DULCOLAX Place 1 suppository (10 mg total) rectally daily as needed for moderate constipation (May repeat times one).   CYMBALTA PO Take 90 mg by mouth at bedtime.   desonide 0.05 % cream Commonly known as:  DESOWEN Apply 1 application topically 2 (two) times daily.   diclofenac sodium 1 % Gel Commonly known as:  VOLTAREN Apply 1 g topically 2 (two) times daily. Apply to bilateral knees for pain   divalproex 125 MG capsule Commonly known as:  DEPAKOTE SPRINKLE Give 4 Capsules (500 mg) by mouth at bedtime   fluticasone 50 MCG/ACT nasal spray Commonly known as:  FLONASE Place 2 sprays into both nostrils at bedtime.   gabapentin 300 MG capsule Commonly known as:  NEURONTIN Take 300 mg by mouth 2 (two) times daily.   loratadine 10 MG tablet Commonly known as:  CLARITIN Take 10 mg by mouth daily.   magnesium hydroxide 400 MG/5ML suspension Commonly known as:  MILK OF MAGNESIA Take 60 mLs by mouth every other day. AS NEEDED FOR CONSTIPATION   methadone 5 MG tablet Commonly known as:  DOLOPHINE Take 1 tablet (5 mg total) by mouth every 12 (twelve) hours.    methocarbamol 750 MG tablet Commonly known as:  ROBAXIN Take 750 mg by mouth every 12 (twelve) hours.   multivitamin tablet Take 1 tablet by mouth daily. What changed:  when to take this   oxyCODONE 5 MG immediate release tablet Commonly known as:  Oxy IR/ROXICODONE Take 1 tablet (5 mg total) by mouth every 4 (four) hours as needed for severe pain.   polyethylene glycol packet Commonly known as:  MIRALAX / GLYCOLAX Take 17 g by mouth daily. What changed:  when to take this   potassium chloride 10 MEQ tablet Commonly known as:  K-DUR Take 10 mEq by mouth daily.   rivaroxaban 20 MG Tabs tablet Commonly known as:  XARELTO Take 20 mg by mouth daily.   temazepam 30 MG capsule Commonly known as:  RESTORIL Take 1 capsule (30 mg total) by mouth at bedtime as needed for sleep. What changed:    when to take this  reasons to take this   timolol 0.5 % ophthalmic solution Commonly known as:  BETIMOL Place 1 drop into both eyes 2 (two) times daily.   torsemide 20 MG tablet Commonly known as:  DEMADEX Take 60 mg by mouth 2 (two) times daily.       Contact information for follow-up providers    [facility].   Why:  at earliest convenience           Contact information for after-discharge care    Destination    [facility] .   Service:  Skilled Nursing Contact information: [address] [address] [phone]                 Allergies  Allergen Reactions  . Tape Other (See Comments)    SKIN IS VERY THIN; IT BRUISES AND TEARS EASILY!! -THX    Consultations:  Palliative care   Procedures/Studies: Dg Chest 2 View  Result Date: 02/21/2018 CLINICAL DATA:  Hypoxia EXAM: CHEST - 2 VIEW COMPARISON:  01/20/2016 chest radiograph. FINDINGS: Bilateral total shoulder arthroplasty. Stable cardiomediastinal silhouette with mild cardiomegaly. No pneumothorax. Small bilateral pleural effusions  with patchy bilateral lower lobe lung opacities. Borderline mild pulmonary edema. IMPRESSION: 1. Borderline mild congestive heart failure with small bilateral pleural effusions. 2. Nonspecific patchy bilateral lower lobe lung opacities, favor atelectasis. Electronically Signed   By: Ilona Sorrel M.D.   On: 02/21/2018 17:15   Dg Chest Port 1 View  Result Date: 02/24/2018 CLINICAL DATA:  Abnormal breath sounds.  Shortness of breath. EXAM: PORTABLE CHEST 1 VIEW COMPARISON:  02/21/2018 FINDINGS: The heart is mildly enlarged but stable. Stable tortuosity and calcification of the thoracic aorta. Bibasilar opacity suspicious for infiltrates or atelectasis. No definite pleural effusions. IMPRESSION: Persistent bibasilar atelectasis or infiltrates. Electronically Signed   By: Marijo Sanes M.D.   On: 02/24/2018 11:00     Subjective: Patient seen and examined at bedside.  She is slightly awake, no distress, poor historian, hardly answers any questions.  No overnight fever, nausea or vomiting reported.  Nursing staff reports increasing pain throughout the day yesterday afternoon.  Discharge Exam: Vitals:   02/26/18 2310 02/27/18 0602  BP: (!) 120/58 138/68  Pulse: 79 79  Resp: 16 17  Temp: 98.5 F (36.9 C) 97.8 F (36.6 C)  SpO2: 96% 95%   Vitals:   02/26/18 1200 02/26/18 1718 02/26/18 2310 02/27/18 0602  BP: (!) 98/56 100/60 (!) 120/58 138/68  Pulse:   79 79  Resp:   16 17  Temp: 97.9 F (36.6 C)  98.5 F (36.9 C) 97.8 F (36.6 C)  TempSrc: Oral  Oral Oral  SpO2:   96% 95%  Weight:    97.2 kg  Height:        General: Elderly female lying in bed,  slightly awake, hardly answers any questions.  Ill looking.  No distress  Cardiovascular: rate controlled, S1/S2 + Respiratory: bilateral decreased breath sounds at bases with some scattered crackles Abdominal: Soft, NT, ND, bowel sounds + Extremities: Trace edema, no cyanosis    The results of significant diagnostics from this  hospitalization (including imaging, microbiology, ancillary and laboratory) are listed below for reference.     Microbiology: Recent Results (from the past 240 hour(s))  Culture, blood (Routine x 2)     Status: None   Collection Time: 02/21/18  4:56 PM  Result Value Ref Range Status   Specimen Description   Final    BLOOD RIGHT ANTECUBITAL Performed at [facility] [address] [place], [place] [address]    Special Requests   Final    BOTTLES DRAWN AEROBIC AND ANAEROBIC Blood Culture adequate volume Performed at [facility] [address] [place], [place] [address]    Culture   Final  NO GROWTH 5 DAYS Performed at [facility], [place] [address], [place], [place] [address]    Report Status 02/26/2018 FINAL  Final  Culture, blood (Routine x 2)     Status: None   Collection Time: 02/21/18  4:57 PM  Result Value Ref Range Status   Specimen Description   Final    BLOOD LEFT ANTECUBITAL Performed at [facility] [address] [place], [place] [address]    Special Requests   Final    BOTTLES DRAWN AEROBIC AND ANAEROBIC Blood Culture results may not be optimal due to an excessive volume of blood received in culture bottles Performed at [facility] [address] [place], [place] [address]    Culture   Final    NO GROWTH 5 DAYS Performed at [facility], [place] [address], [place], [place] [address]    Report Status 02/26/2018 FINAL  Final  MRSA PCR Screening     Status: None   Collection Time: 02/22/18  5:39 AM  Result Value Ref Range Status   MRSA by PCR NEGATIVE NEGATIVE Final    Comment:        The GeneXpert MRSA Assay (FDA approved for NASAL specimens only), is one component of a comprehensive MRSA colonization surveillance program. It is not intended to diagnose MRSA infection nor to guide or monitor treatment for MRSA infections. Performed at [facility], [place] [address] [place], [place] [address]      Labs: BNP (last 3 results) Recent Labs    02/21/18 1657  BNP 07.6   Basic Metabolic Panel: Recent Labs  Lab 02/23/18 0443 02/24/18 0525 02/25/18 0459 02/26/18 0748 02/27/18 0500  NA 142 141 140 138 140  K 3.2* 4.2 3.5 3.5 3.7  CL 97* 98 94* 90* 89*  CO2 34* 34* 38* 37* 41*  GLUCOSE 94 93 102* 105* 103*  BUN 13 18 20 23  29*  CREATININE 0.83 0.95 0.85 0.91 0.99  CALCIUM 9.1 9.4 9.6 9.6 10.1  MG 2.1 2.2 2.3 2.4 2.9*   Liver Function Tests: Recent Labs  Lab 02/21/18 1655 02/22/18 0530 02/23/18 0443 02/25/18 0459  AST 12* 10* 11* 11*  ALT 9 7 6 7   ALKPHOS 109 115 81 76  BILITOT 0.8 0.8 0.7 0.4  PROT 7.3 6.4* 6.5 6.9  ALBUMIN 2.1* 2.0* 1.9* 2.1*   No results for input(s): LIPASE, AMYLASE in the last 168 hours. No results for input(s): AMMONIA in the last 168 hours. CBC: Recent Labs  Lab 02/23/18 0443 02/24/18 0525 02/25/18 0459 02/26/18 0748 02/27/18 0500  WBC 12.1* 10.8* 9.4 8.5 8.2  NEUTROABS 8.9* 7.5 5.9 4.9 4.6  HGB 9.5* 9.5* 9.7* 9.7* 11.0*  HCT 30.6* 31.1* 31.6* 31.9* 36.6  MCV 101.0* 100.0 100.3* 99.7 102.2*  PLT 383 435* 421* 465* 431*   Cardiac Enzymes: No results for input(s): CKTOTAL, CKMB, CKMBINDEX, TROPONINI in the last 168 hours. BNP: Invalid input(s): POCBNP CBG: No results for input(s): GLUCAP in the last 168 hours. D-Dimer No results for input(s): DDIMER in the last 72 hours. Hgb A1c No results for input(s): HGBA1C in the last 72 hours. Lipid Profile No results for input(s): CHOL, HDL, LDLCALC, TRIG, CHOLHDL, LDLDIRECT in the last 72 hours. Thyroid function studies No results for input(s): TSH, T4TOTAL, T3FREE, THYROIDAB in the last 72 hours.  Invalid input(s): FREET3 Anemia work up No results for input(s): VITAMINB12, FOLATE, FERRITIN, TIBC, IRON, RETICCTPCT in the last 72 hours.  Urinalysis    Component Value Date/Time   COLORURINE YELLOW 02/21/2018 1656    APPEARANCEUR CLEAR 02/21/2018 1656   LABSPEC 1.010 02/21/2018 1656   PHURINE 6.0 02/21/2018 1656   GLUCOSEU NEGATIVE 02/21/2018 1656   HGBUR NEGATIVE 02/21/2018 1656   BILIRUBINUR NEGATIVE 02/21/2018 1656   KETONESUR 5 (A) 02/21/2018 1656   PROTEINUR NEGATIVE 02/21/2018 1656   UROBILINOGEN 0.2 08/04/2014 2100   NITRITE NEGATIVE 02/21/2018 1656   LEUKOCYTESUR NEGATIVE 02/21/2018 1656   Sepsis Labs Invalid input(s): PROCALCITONIN,  WBC,  LACTICIDVEN Microbiology Recent Results (from the past 240 hour(s))  Culture, blood (Routine x 2)     Status: None   Collection Time: 02/21/18  4:56 PM  Result Value Ref Range Status   Specimen Description   Final    BLOOD RIGHT ANTECUBITAL Performed at [facility], [place] [address], [place], [place] [address]    Special Requests   Final    BOTTLES DRAWN AEROBIC AND ANAEROBIC Blood Culture adequate volume Performed at [facility] [address], [place], [place] [address]    Culture   Final    NO GROWTH 5 DAYS Performed at [facility], [place] [address] [place], [place] [address]    Report Status 02/26/2018 FINAL  Final  Culture, blood (Routine x 2)     Status: None   Collection Time: 02/21/18  4:57 PM  Result Value Ref Range Status   Specimen Description   Final    BLOOD LEFT ANTECUBITAL Performed at [facility] [address], [place], [place] [address]    Special Requests   Final    BOTTLES DRAWN AEROBIC AND ANAEROBIC Blood Culture results may not be optimal due to an excessive volume of blood received in culture bottles Performed at [facility] [address], [place], [place] [address]    Culture   Final    NO GROWTH 5 DAYS Performed at [facility], [place] [address] [place], [place] [address]    Report Status 02/26/2018 FINAL  Final  MRSA PCR Screening     Status: None   Collection Time: 02/22/18  5:39 AM  Result Value Ref Range Status    MRSA by PCR NEGATIVE NEGATIVE Final    Comment:        The GeneXpert MRSA Assay (FDA approved for NASAL specimens only), is one component of a comprehensive MRSA colonization surveillance program. It is not intended to diagnose MRSA infection nor to guide or monitor treatment for MRSA infections. Performed at [facility], [place] [address], [place], [place] [address]      Time coordinating discharge: 35 minutes  SIGNED:   Aline August, MD  [facility] 02/27/2018, 10:16 AM Pager: [phone]  If 7PM-7AM, please contact night-coverage [email or web address] Password [email or web address]

## 2018-02-27 NOTE — Progress Notes (Signed)
D/C summary sent to SNF.  [facility] arranged for transport Nurse call report [phone]  Kathrin Greathouse, Marlinda Mike, MSW Clinical Social Worker  [phone] 02/27/2018  10:35 AM

## 2018-02-28 DIAGNOSIS — M545 Low back pain: Secondary | ICD-10-CM | POA: Diagnosis not present

## 2018-02-28 DIAGNOSIS — R600 Localized edema: Secondary | ICD-10-CM | POA: Diagnosis not present

## 2018-03-22 DIAGNOSIS — K259 Gastric ulcer, unspecified as acute or chronic, without hemorrhage or perforation: Secondary | ICD-10-CM | POA: Diagnosis not present

## 2018-03-22 DIAGNOSIS — M545 Low back pain: Secondary | ICD-10-CM | POA: Diagnosis not present

## 2018-03-22 DIAGNOSIS — F329 Major depressive disorder, single episode, unspecified: Secondary | ICD-10-CM | POA: Diagnosis not present

## 2018-03-22 DIAGNOSIS — R600 Localized edema: Secondary | ICD-10-CM | POA: Diagnosis not present

## 2018-03-28 DIAGNOSIS — F329 Major depressive disorder, single episode, unspecified: Secondary | ICD-10-CM | POA: Diagnosis not present

## 2018-03-28 DIAGNOSIS — M545 Low back pain: Secondary | ICD-10-CM | POA: Diagnosis not present

## 2018-03-28 DIAGNOSIS — K259 Gastric ulcer, unspecified as acute or chronic, without hemorrhage or perforation: Secondary | ICD-10-CM | POA: Diagnosis not present

## 2018-03-28 DIAGNOSIS — R600 Localized edema: Secondary | ICD-10-CM | POA: Diagnosis not present

## 2018-04-11 DIAGNOSIS — F331 Major depressive disorder, recurrent, moderate: Secondary | ICD-10-CM | POA: Diagnosis not present

## 2018-04-11 DIAGNOSIS — F5101 Primary insomnia: Secondary | ICD-10-CM | POA: Diagnosis not present

## 2018-04-19 DIAGNOSIS — F329 Major depressive disorder, single episode, unspecified: Secondary | ICD-10-CM | POA: Diagnosis not present

## 2018-04-19 DIAGNOSIS — M25541 Pain in joints of right hand: Secondary | ICD-10-CM | POA: Diagnosis not present

## 2018-04-19 DIAGNOSIS — M1990 Unspecified osteoarthritis, unspecified site: Secondary | ICD-10-CM | POA: Diagnosis not present

## 2018-04-19 DIAGNOSIS — M25542 Pain in joints of left hand: Secondary | ICD-10-CM | POA: Diagnosis not present

## 2018-04-25 DIAGNOSIS — M1990 Unspecified osteoarthritis, unspecified site: Secondary | ICD-10-CM | POA: Diagnosis not present

## 2018-04-25 DIAGNOSIS — F329 Major depressive disorder, single episode, unspecified: Secondary | ICD-10-CM | POA: Diagnosis not present

## 2018-04-25 DIAGNOSIS — M25541 Pain in joints of right hand: Secondary | ICD-10-CM | POA: Diagnosis not present

## 2018-04-25 DIAGNOSIS — M25542 Pain in joints of left hand: Secondary | ICD-10-CM | POA: Diagnosis not present

## 2018-05-03 DIAGNOSIS — R400 Somnolence: Secondary | ICD-10-CM | POA: Diagnosis not present

## 2018-05-03 DIAGNOSIS — F329 Major depressive disorder, single episode, unspecified: Secondary | ICD-10-CM | POA: Diagnosis not present

## 2018-05-03 DIAGNOSIS — H1031 Unspecified acute conjunctivitis, right eye: Secondary | ICD-10-CM | POA: Diagnosis not present

## 2018-05-03 DIAGNOSIS — M1990 Unspecified osteoarthritis, unspecified site: Secondary | ICD-10-CM | POA: Diagnosis not present

## 2018-05-19 DEATH — deceased

## 2018-09-06 IMAGING — RF DG NASO G TUBE PLC W/FL W/RAD
2 series · 2 of 2 positions shown · IV contrast (agent unspecified)
Comparison: None.

CLINICAL DATA: 85-year-old female with small bowel obstruction in
need of nasogastric tube.

EXAM:
NASO G TUBE PLACEMENT WITH FL AND WITH RAD
CONTRAST:  None.
FLUOROSCOPY TIME:  Fluoroscopy Time:  6 minutes and 42 seconds

[Series 1: cp_standard · 0.27mm/px · 1 of 1 slices shown (1 of 2)]
[im 1/1]
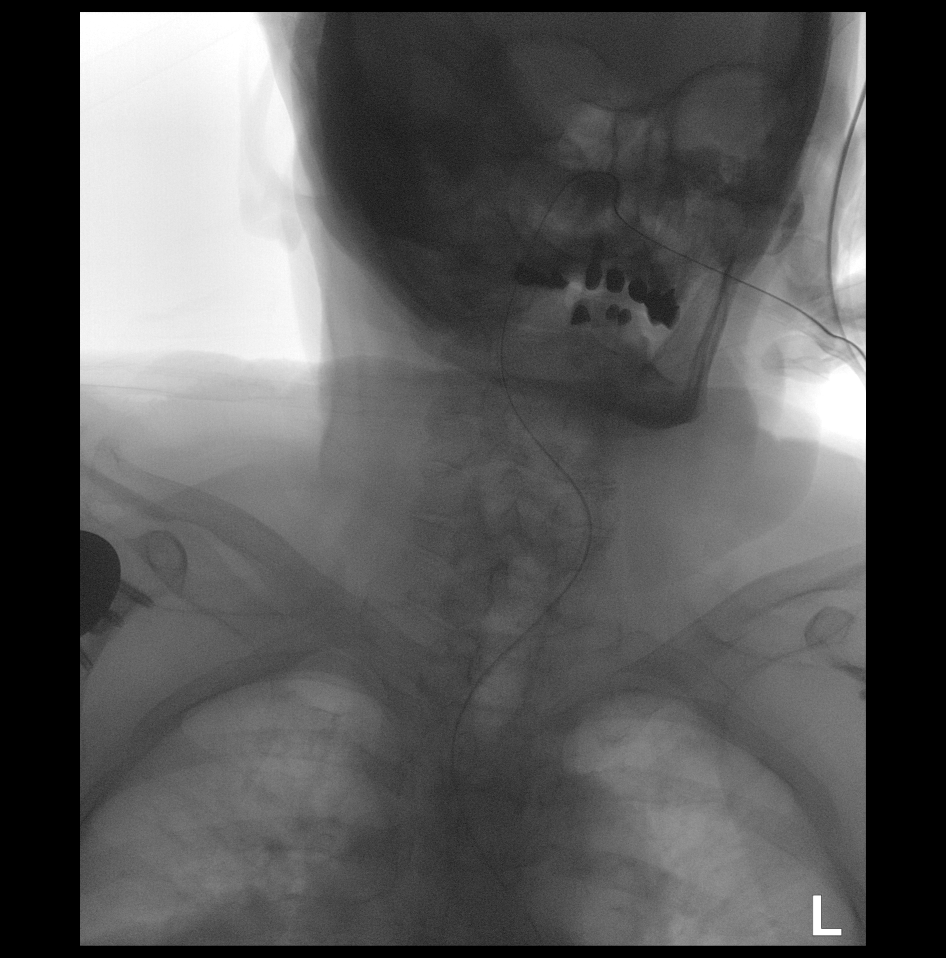

[Series 2: cp_standard · 0.27mm/px · 1 of 1 slices shown (2 of 2)]
[im 1/1]
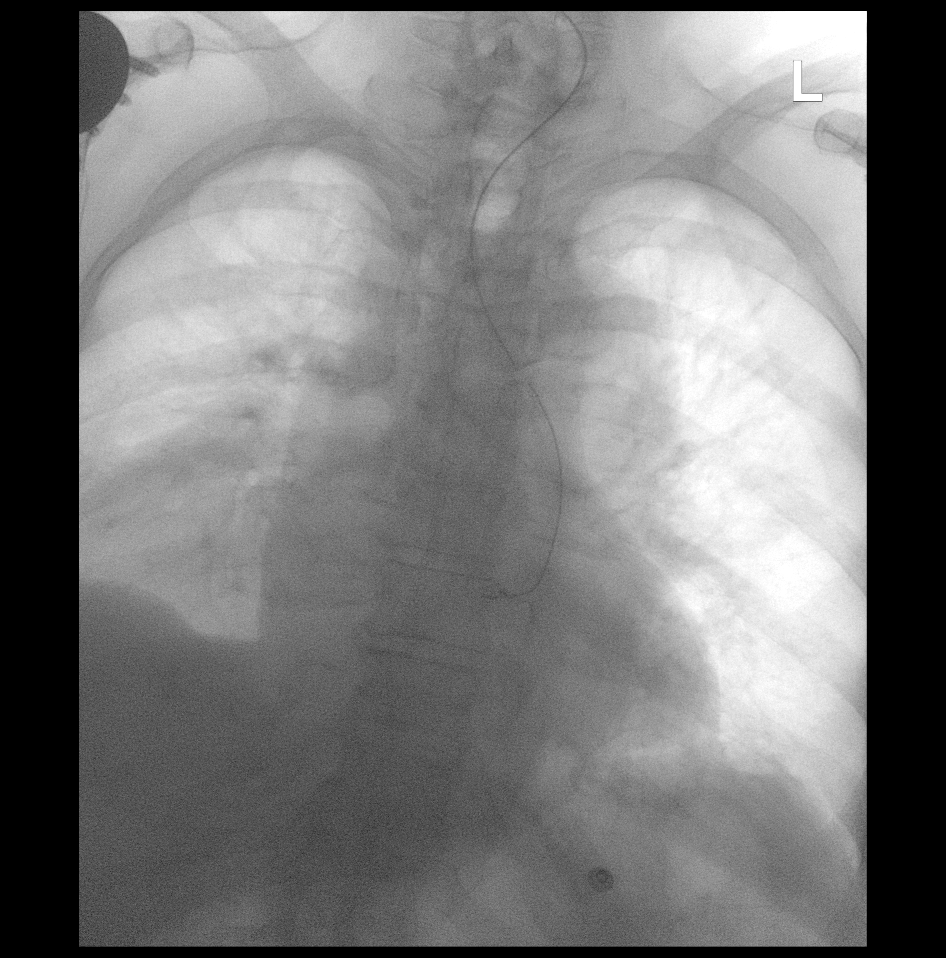

[2 of 2 positions shown; findings below may reference images not displayed]

FINDINGS: Despite multiple attempts to fluoroscopically place a nasogastric
tube into the patient's stomach, significant resistance was met in
the region of the distal third of the esophagus at every attempt,
with inability to pass the nasogastric tube beyond the area of
apparent physical obstruction. During no time was the tube even able
to extend into the intrathoracic portion of the patient's large
hiatal hernia. Accordingly, the tube was removed from the patient,
and the examination was terminated.
IMPRESSION: 1. Unsuccessful attempt to fluoroscopically place a nasogastric tube
into the stomach. Based upon review of the recent CT the abdomen and
pelvis, this is presumably related to the patient's complex anatomy.
The patient has a very large hiatal hernia, within acute tortuosity
of the esophagus as it leads toward the hernia. Given the inability
to pass the tube, the possibility of a proximal obstructing mass
should be considered, and further evaluation with endoscopy,
contrast enhanced chest CT, and/or barium swallow is recommended to
better evaluate these findings.

## 2018-09-07 IMAGING — DX DG ABD PORTABLE 1V
1 series · 1 of 1 positions shown · non-contrast
Comparison: Portable supine abdominal radiographs of December 19, 2015

CLINICAL DATA: Small bowel obstruction history of previous
perforated gastric ulcer in 1616.

EXAM:
PORTABLE ABDOMEN - 1 VIEW

[abdomen supine]
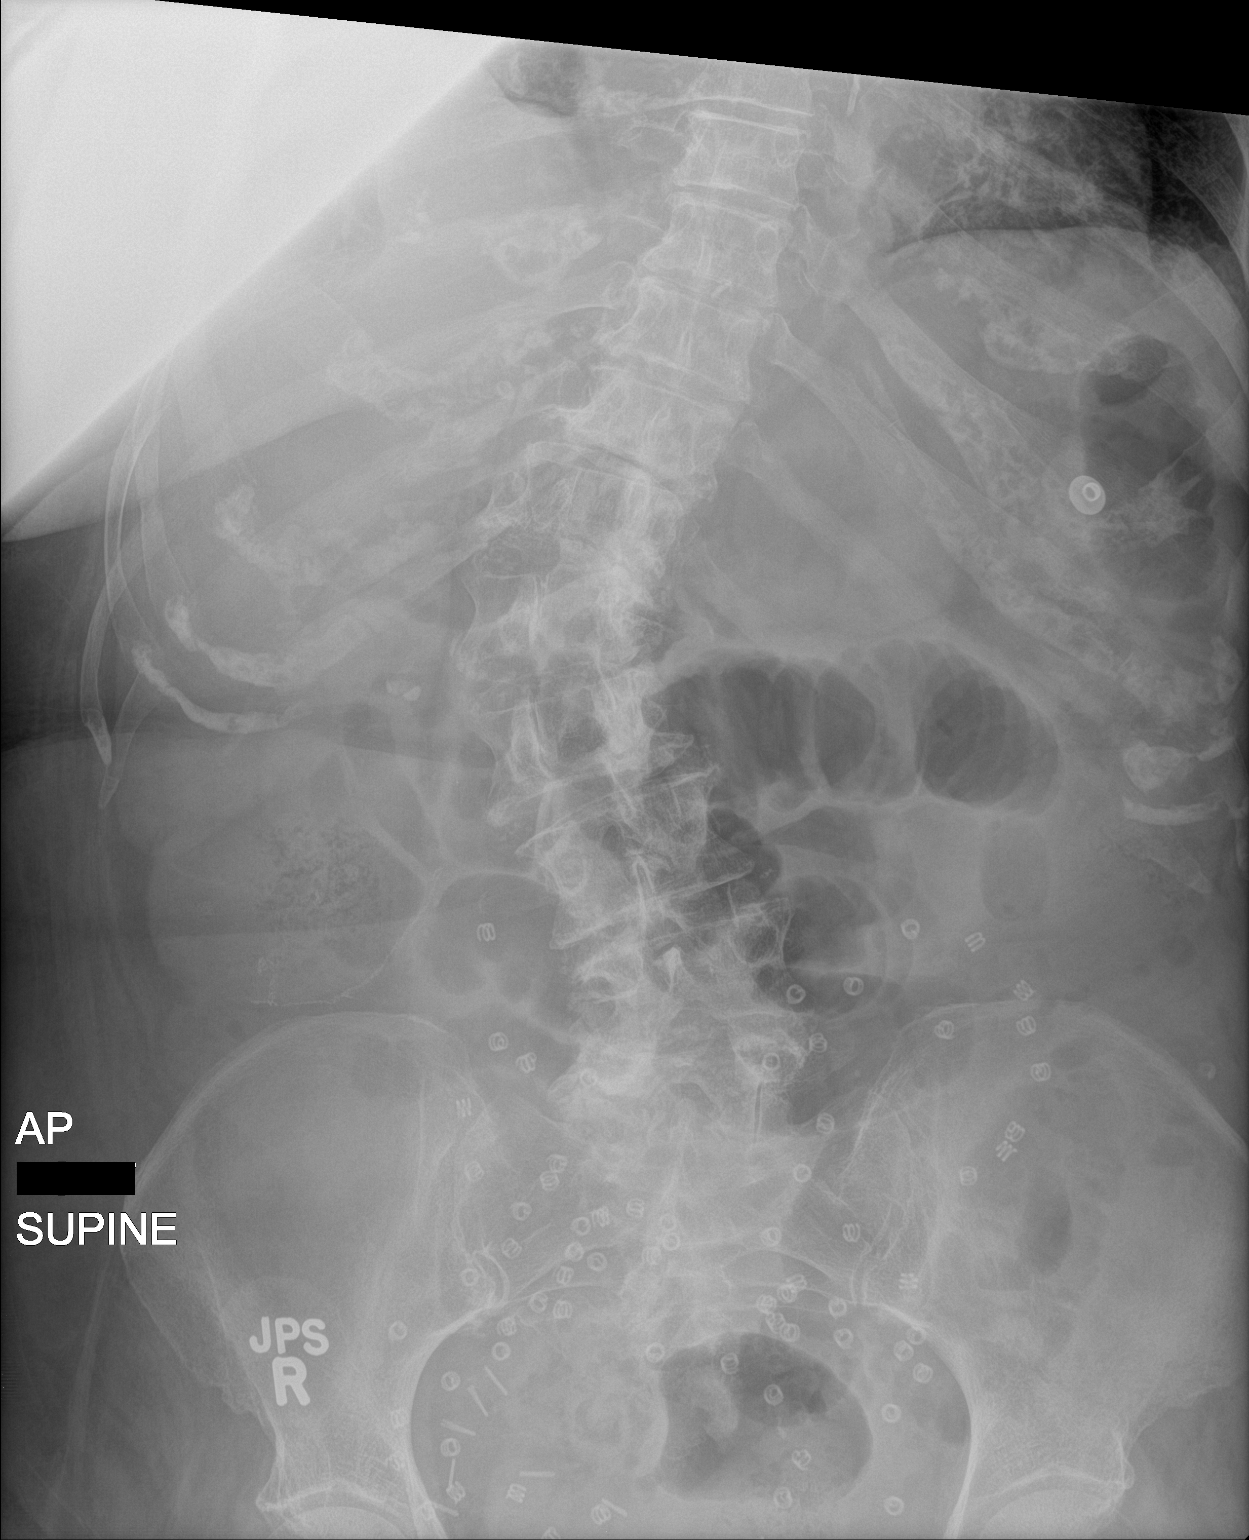

[1 of 1 positions shown; findings below may reference images not displayed]

FINDINGS: There remain loops of moderately distended gas-filled small bowel in
the mid abdomen. A few loops of normal caliber bowel are noted as
well. There is some stool and gas in the colon and rectum. No free
extraluminal gas collections are observed. There are degenerative
changes of the lumbar spine with moderate levocurvature centered at
L1. Numerous coils from previous hernia repair are present.
IMPRESSION: Bowel gas pattern consistent with a clinically known partial mid to
distal small bowel obstruction. No evidence of perforation
currently.

## 2018-09-09 IMAGING — CR DG CHEST 1V PORT
1 series · 1 of 1 positions shown · non-contrast
Comparison: Chest radiographs August 05, 2014 and December 18, 2015

CLINICAL DATA: Decreased oxygen saturation.

EXAM:
PORTABLE CHEST 1 VIEW

[portable]
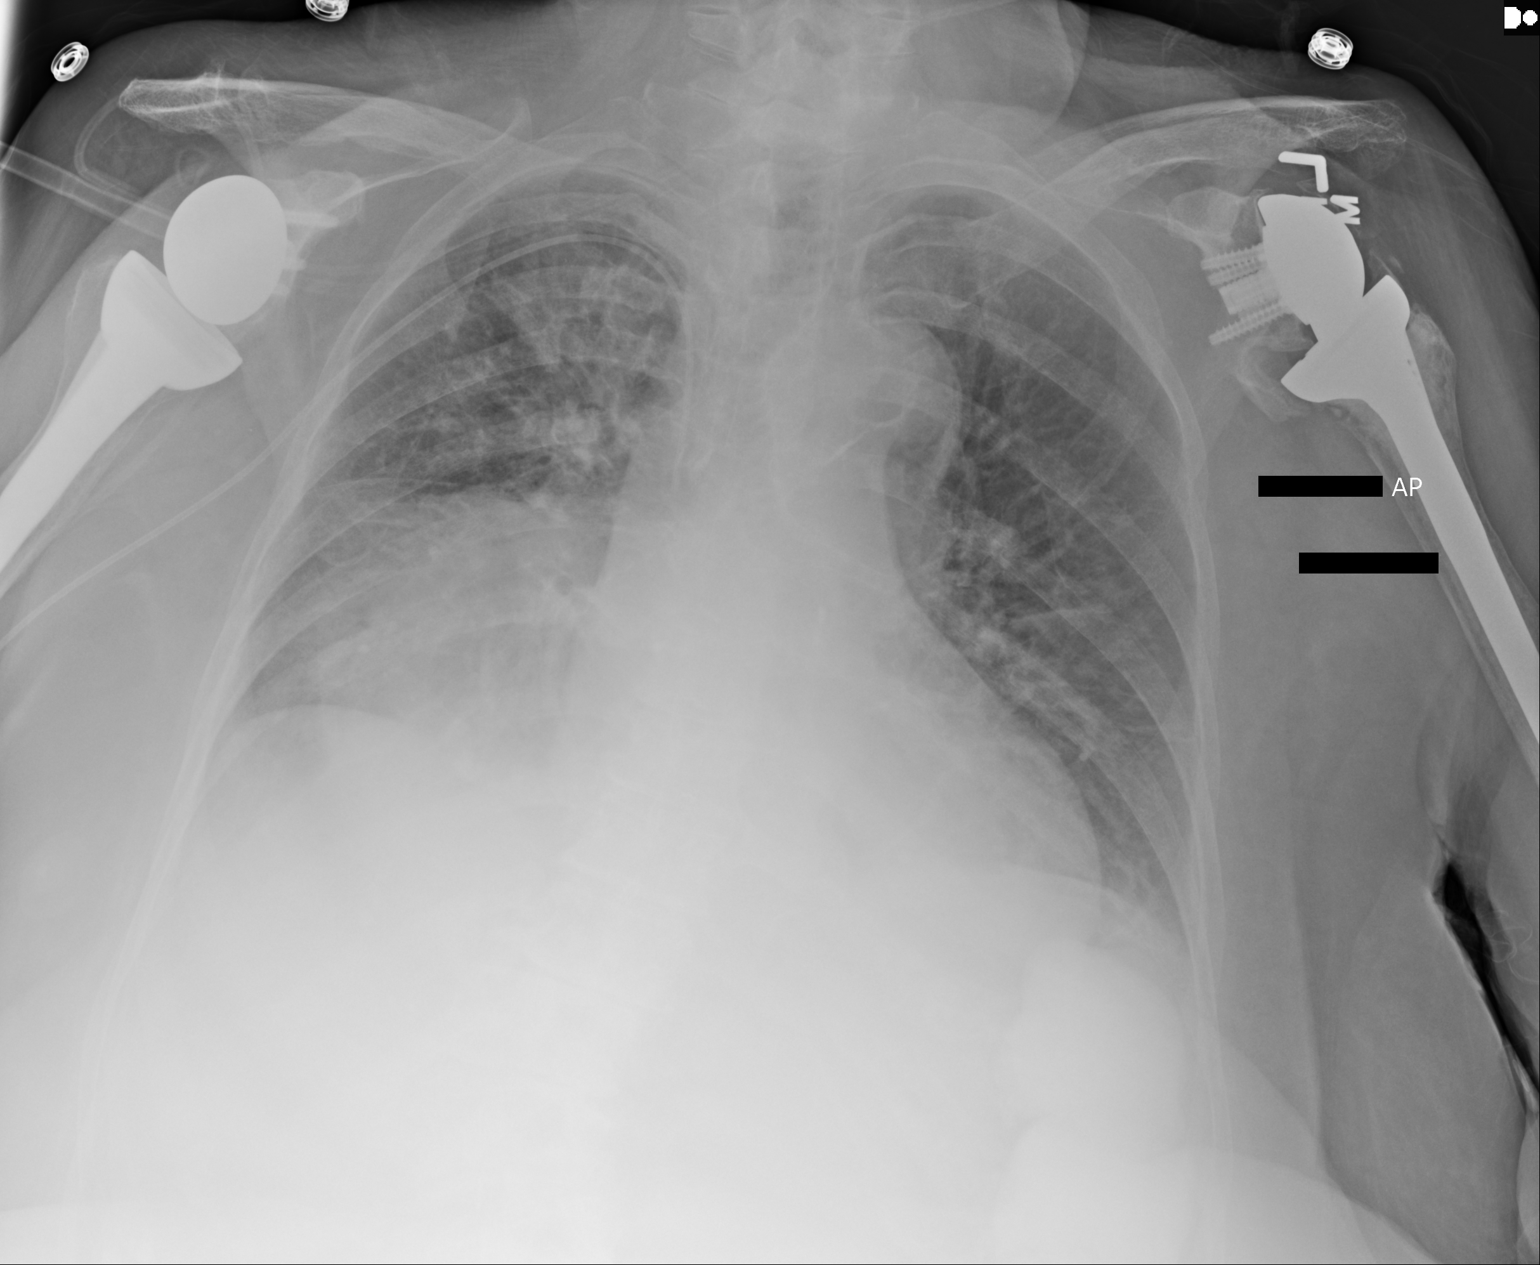

[1 of 1 positions shown; findings below may reference images not displayed]

FINDINGS: There is a large paraesophageal hernia on the right, grossly stable.
There is mild atelectasis in each mid lung. Lungs elsewhere clear.
Heart size is upper normal with pulmonary vascularity within normal
limits. There is atherosclerotic calcification in the aorta. Central
catheter tip is in the superior vena cava. There are total shoulder
replacements bilaterally. No pneumothorax.
IMPRESSION: Large right-sided paraesophageal hernia. Areas of mild atelectasis
without frank edema or consolidation. Stable cardiac silhouette.
Aortic atherosclerosis. Central catheter tip in superior vena cava.

## 2018-09-12 IMAGING — CR DG CHEST 1V PORT
1 series · 1 of 1 positions shown · non-contrast
Comparison: CT of the abdomen 12/18/2015 and esophagram on
12/20/2015.

CLINICAL DATA: Productive cough and shortness of breath. Surgery on
12/22/2015 for infected ventral hernia mesh and small bowel
obstruction.

EXAM:
PORTABLE CHEST 1 VIEW

[AP]
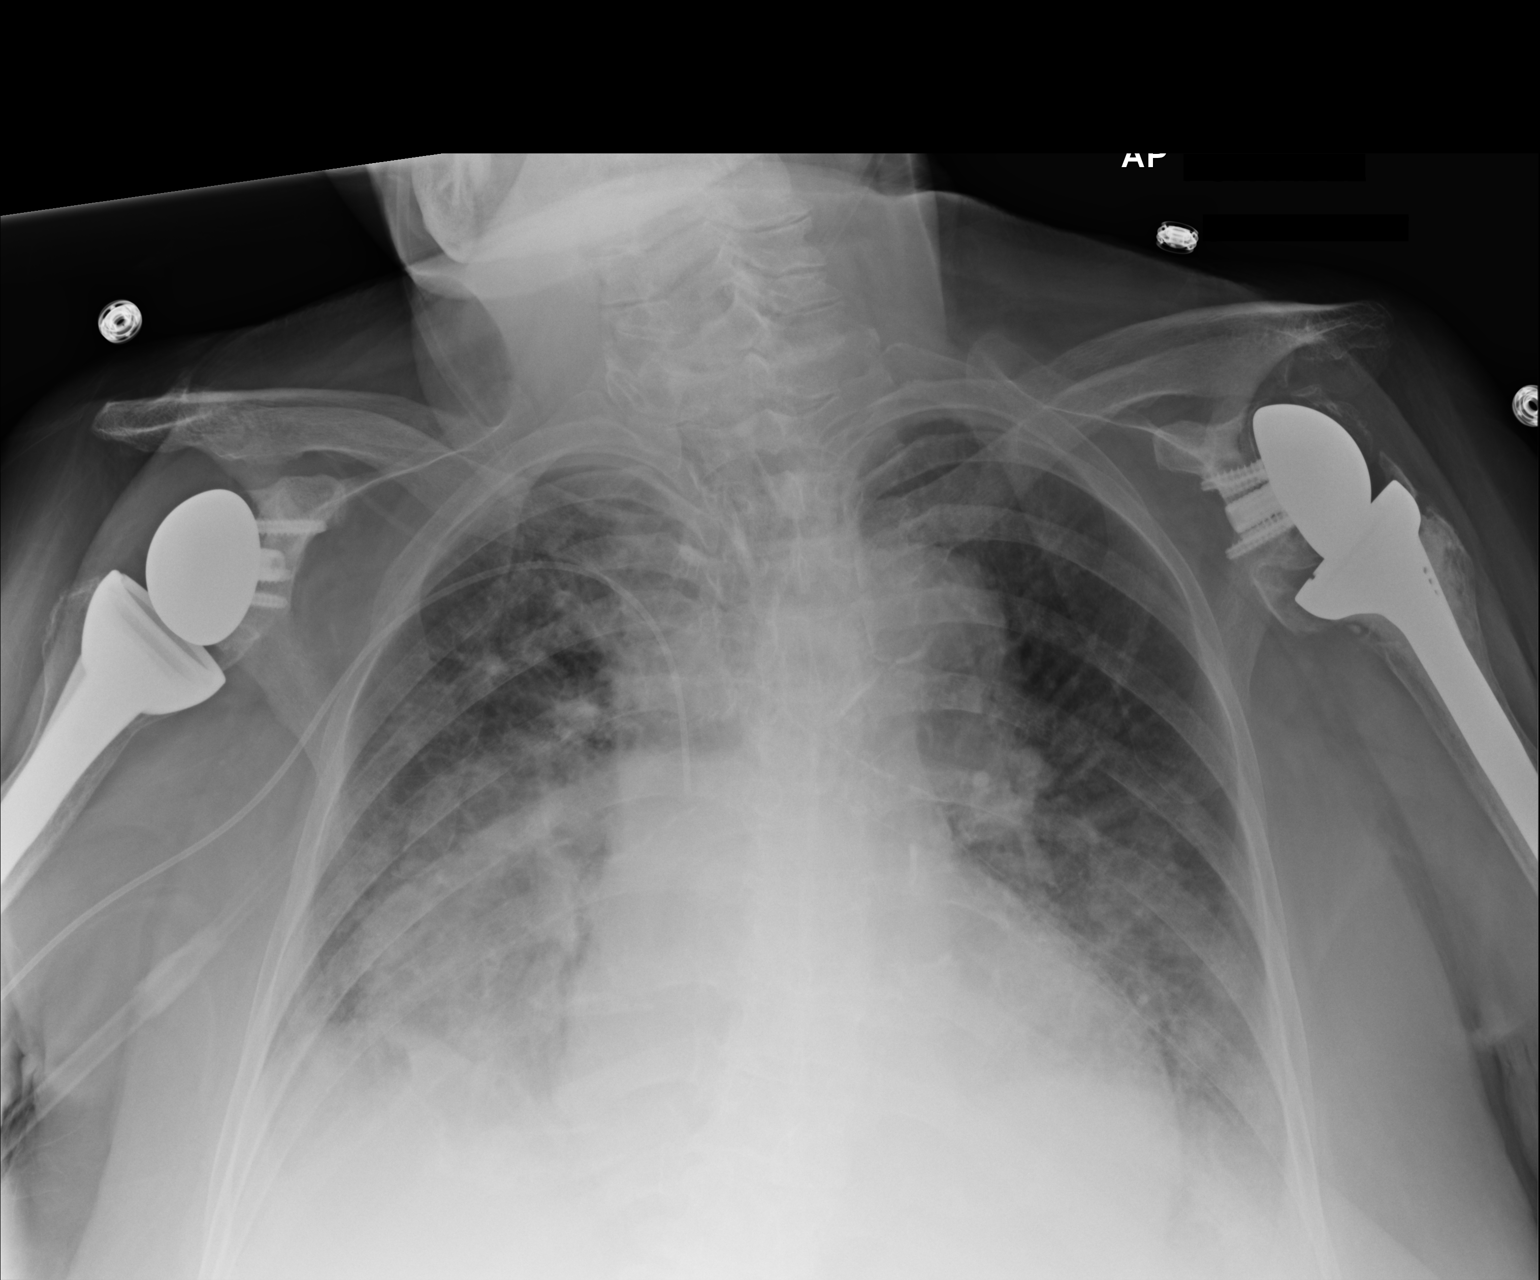

[1 of 1 positions shown; findings below may reference images not displayed]

FINDINGS: Chronic opacity in the right lower chest likely relates to the known
large hiatal/paraesophageal hernia. There may be a developing subtle
infiltrate in the left lower lung. Lung volumes are low. The heart
size is stable. PICC line tip is in the SVC.
IMPRESSION: Possible developing left lower lung infiltrate. Opacity in the right
chest likely relates to the known large hiatal/paraesophageal
hernia.
